# Patient Record
Sex: Male | Born: 1950 | Race: Black or African American | Hispanic: No | Marital: Married | Smoking: Never smoker
Health system: Southern US, Community
[De-identification: ages and names within clinical notes are randomized; demographics above are authoritative.]

## PROBLEM LIST (undated history)

## (undated) DIAGNOSIS — F32A Depression, unspecified: Secondary | ICD-10-CM

## (undated) DIAGNOSIS — E785 Hyperlipidemia, unspecified: Secondary | ICD-10-CM

## (undated) DIAGNOSIS — I10 Essential (primary) hypertension: Secondary | ICD-10-CM

## (undated) DIAGNOSIS — F329 Major depressive disorder, single episode, unspecified: Secondary | ICD-10-CM

## (undated) DIAGNOSIS — E1121 Type 2 diabetes mellitus with diabetic nephropathy: Secondary | ICD-10-CM

## (undated) DIAGNOSIS — M25569 Pain in unspecified knee: Secondary | ICD-10-CM

## (undated) DIAGNOSIS — N183 Chronic kidney disease, stage 3 (moderate): Secondary | ICD-10-CM

## (undated) DIAGNOSIS — H269 Unspecified cataract: Secondary | ICD-10-CM

## (undated) DIAGNOSIS — G40209 Localization-related (focal) (partial) symptomatic epilepsy and epileptic syndromes with complex partial seizures, not intractable, without status epilepticus: Secondary | ICD-10-CM

## (undated) DIAGNOSIS — E559 Vitamin D deficiency, unspecified: Secondary | ICD-10-CM

## (undated) DIAGNOSIS — H409 Unspecified glaucoma: Secondary | ICD-10-CM

## (undated) DIAGNOSIS — Z8619 Personal history of other infectious and parasitic diseases: Secondary | ICD-10-CM

## (undated) DIAGNOSIS — R972 Elevated prostate specific antigen [PSA]: Secondary | ICD-10-CM

## (undated) DIAGNOSIS — E1122 Type 2 diabetes mellitus with diabetic chronic kidney disease: Secondary | ICD-10-CM

## (undated) DIAGNOSIS — I471 Supraventricular tachycardia: Secondary | ICD-10-CM

## (undated) HISTORY — DX: Supraventricular tachycardia: I47.1

## (undated) HISTORY — DX: Vitamin D deficiency, unspecified: E55.9

## (undated) HISTORY — DX: Type 2 diabetes mellitus with diabetic chronic kidney disease: E11.22

## (undated) HISTORY — DX: Unspecified cataract: H26.9

## (undated) HISTORY — DX: Personal history of other infectious and parasitic diseases: Z86.19

## (undated) HISTORY — DX: Pain in unspecified knee: M25.569

## (undated) HISTORY — DX: Chronic kidney disease, stage 3 (moderate): N18.3

## (undated) HISTORY — DX: Major depressive disorder, single episode, unspecified: F32.9

## (undated) HISTORY — DX: Unspecified glaucoma: H40.9

## (undated) HISTORY — DX: Essential (primary) hypertension: I10

## (undated) HISTORY — DX: Hyperlipidemia, unspecified: E78.5

## (undated) HISTORY — DX: Type 2 diabetes mellitus with diabetic nephropathy: E11.21

## (undated) HISTORY — DX: Depression, unspecified: F32.A

## (undated) HISTORY — DX: Elevated prostate specific antigen (PSA): R97.20

## (undated) HISTORY — DX: Localization-related (focal) (partial) symptomatic epilepsy and epileptic syndromes with complex partial seizures, not intractable, without status epilepticus: G40.209

---
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
## 1988-08-22 DIAGNOSIS — G40209 Localization-related (focal) (partial) symptomatic epilepsy and epileptic syndromes with complex partial seizures, not intractable, without status epilepticus: Secondary | ICD-10-CM

## 1988-08-22 HISTORY — DX: Localization-related (focal) (partial) symptomatic epilepsy and epileptic syndromes with complex partial seizures, not intractable, without status epilepticus: G40.209

## 1988-08-22 HISTORY — PX: OTHER SURGICAL HISTORY: SHX169

## 1994-08-22 DIAGNOSIS — I471 Supraventricular tachycardia, unspecified: Secondary | ICD-10-CM

## 1994-08-22 DIAGNOSIS — E1121 Type 2 diabetes mellitus with diabetic nephropathy: Secondary | ICD-10-CM

## 1994-08-22 HISTORY — DX: Supraventricular tachycardia: I47.1

## 1994-08-22 HISTORY — DX: Type 2 diabetes mellitus with diabetic nephropathy: E11.21

## 1994-08-22 HISTORY — DX: Supraventricular tachycardia, unspecified: I47.10

## 2005-09-16 HISTORY — PX: COLONOSCOPY: SHX174

## 2009-02-19 HISTORY — PX: CARDIAC CATHETERIZATION: SHX172

## 2009-05-01 ENCOUNTER — Encounter: Payer: Self-pay | Admitting: Family Medicine

## 2009-10-22 ENCOUNTER — Encounter: Payer: Self-pay | Admitting: Family Medicine

## 2009-10-22 LAB — CONVERTED CEMR LAB
Hemoglobin: 12.7 g/dL
Platelets: 214 10*3/uL
TSH: 1.29 microintl units/mL
WBC: 5.2 10*3/uL

## 2009-11-20 HISTORY — PX: REPLACEMENT TOTAL KNEE: SUR1224

## 2010-03-05 LAB — HM DIABETES EYE EXAM

## 2010-03-11 ENCOUNTER — Encounter: Payer: Self-pay | Admitting: Family Medicine

## 2010-03-11 LAB — CONVERTED CEMR LAB
ALT: 12 units/L
AST: 13 units/L
Albumin: 3.8 g/dL
Alkaline Phosphatase: 103 units/L
Chloride: 102 meq/L
HDL: 42 mg/dL
LDL Cholesterol: 51 mg/dL
Potassium: 3.7 meq/L
Sodium: 139 meq/L
Total Bilirubin: 0.6 mg/dL
Total Protein: 6.3 g/dL

## 2010-03-12 ENCOUNTER — Encounter: Payer: Self-pay | Admitting: Family Medicine

## 2010-03-12 LAB — CONVERTED CEMR LAB
AST: 13 units/L
BUN: 14 mg/dL
CO2: 23 meq/L
Calcium: 8.9 mg/dL
Creatinine, Ser: 1.45 mg/dL
Hgb A1c MFr Bld: 5.5 %
Potassium: 3.7 meq/L
Triglycerides: 59 mg/dL

## 2010-03-30 ENCOUNTER — Encounter: Payer: Self-pay | Admitting: Family Medicine

## 2010-03-30 DIAGNOSIS — E1121 Type 2 diabetes mellitus with diabetic nephropathy: Secondary | ICD-10-CM | POA: Insufficient documentation

## 2010-03-30 DIAGNOSIS — E785 Hyperlipidemia, unspecified: Secondary | ICD-10-CM

## 2010-03-30 DIAGNOSIS — G40109 Localization-related (focal) (partial) symptomatic epilepsy and epileptic syndromes with simple partial seizures, not intractable, without status epilepticus: Secondary | ICD-10-CM | POA: Insufficient documentation

## 2010-03-30 DIAGNOSIS — I10 Essential (primary) hypertension: Secondary | ICD-10-CM | POA: Insufficient documentation

## 2010-04-07 ENCOUNTER — Ambulatory Visit: Payer: Self-pay | Admitting: Internal Medicine

## 2010-04-07 DIAGNOSIS — Z96659 Presence of unspecified artificial knee joint: Secondary | ICD-10-CM

## 2010-04-13 ENCOUNTER — Encounter: Payer: Self-pay | Admitting: Family Medicine

## 2010-04-21 ENCOUNTER — Ambulatory Visit: Payer: Self-pay | Admitting: Internal Medicine

## 2010-07-21 ENCOUNTER — Ambulatory Visit: Payer: Self-pay | Admitting: Internal Medicine

## 2010-07-21 ENCOUNTER — Encounter: Payer: Self-pay | Admitting: Family Medicine

## 2010-07-21 LAB — CONVERTED CEMR LAB
CO2: 23 meq/L
Calcium: 9.4 mg/dL
Chloride: 104 meq/L
Creatinine, Ser: 1.47 mg/dL
Glucose, Bld: 151 mg/dL

## 2010-07-21 LAB — HM DIABETES FOOT EXAM

## 2010-07-22 ENCOUNTER — Encounter: Payer: Self-pay | Admitting: Family Medicine

## 2010-09-20 ENCOUNTER — Ambulatory Visit
Admission: RE | Admit: 2010-09-20 | Discharge: 2010-09-20 | Payer: Self-pay | Source: Home / Self Care | Attending: Family Medicine | Admitting: Family Medicine

## 2010-09-21 NOTE — Letter (Signed)
Summary: new pt input from [facility] / DR. MORAYATI  MED REC FROM [facility] / DR. MORAYATI   Imported By: Carin Primrose 04/13/2010 10:33:23  _____________________________________________________________________  External Attachment:    Type:   Image     Comment:   External Document  Appended Document: new pt input [place], [place]    Clinical Lists Changes  Observations: Added new observation of PAST MED HX: T2DM HTN HLD partial epilepsy (neurologist Dr. Silvano Rusk [place]) R knee pain - s/p replacement h/o Hep A SVT 1996 (04/13/2010 10:49) Added new observation of PAST SURG HX: Corrective surgery amblyopia (1957) L Knee surgery 1971 (torn ACL) Cystectomy or meningioma removal brain 1990 (unclear) cath 02/2009 - no blockages (Dr. Park Breed Cardiologist in [place]) L knee replacement 11/2009 ([facility] [place]) (04/13/2010 10:49) Added new observation of HGBA1C: 7.0 % (05/01/2009 10:49) Added new observation of LDL: 76 mg/dL (30/86/5784 69:62) Added new observation of HDL: 46 mg/dL (95/28/4132 44:01) Added new observation of TRIGLYC TOT: 90 mg/dL (02/72/5366 44:03) Added new observation of CHOLESTEROL: 140 mg/dL (47/42/5956 38:75) Added new observation of PSA: 1.6 ng/mL (05/01/2009 10:49) Added new observation of ALBUMIN: 4.4 g/dL (64/33/2951 88:41) Added new observation of PROTEIN, TOT: 6.8 g/dL (66/01/3015 01:09) Added new observation of SGPT (ALT): 16 units/L (05/01/2009 10:49) Added new observation of SGOT (AST): 14 units/L (05/01/2009 10:49) Added new observation of ALK PHOS: 97 units/L (05/01/2009 10:49) Added new observation of BILI TOTAL: 0.8 mg/dL (32/35/5732 20:25) Added new observation of PLATELETK/UL: 174 K/uL (05/01/2009 10:49) Added new observation of HCT: 37.6 % (05/01/2009 10:49) Added new observation of HGB: 12.4 g/dL (42/70/6237 62:83) Added new observation of WBC COUNT: 5.5 10*3/microliter (05/01/2009 10:49)        Past  History:  Past Medical History: T2DM HTN HLD partial epilepsy (neurologist Dr. Silvano Rusk [place]) R knee pain - s/p replacement h/o Hep A SVT 1996  Past Surgical History: Corrective surgery amblyopia (1957) L Knee surgery 1971 (torn ACL) Cystectomy or meningioma removal brain 1990 (unclear) cath 02/2009 - no blockages (Dr. Park Breed Cardiologist in [place]) L knee replacement 11/2009 ([facility] [place])

## 2010-09-21 NOTE — Miscellaneous (Signed)
  Clinical Lists Changes  Observations: Added new observation of HGBA1C: 5.9 % (07/21/2010 16:00) Added new observation of PSA: 2.0 ng/mL (07/21/2010 16:00) Added new observation of CALCIUM: 9.4 mg/dL (27/25/3664 40:34) Added new observation of CREATININE: 1.47 mg/dL (74/25/9563 87:56) Added new observation of BUN: 18 mg/dL (43/32/9518 84:16) Added new observation of BG RANDOM: 151 mg/dL (60/63/0160 10:93) Added new observation of CO2 PLSM/SER: 23 meq/L (07/21/2010 16:00) Added new observation of CL SERUM: 104 meq/L (07/21/2010 16:00) Added new observation of K SERUM: 4.2 meq/L (07/21/2010 16:00) Added new observation of NA: 140 meq/L (07/21/2010 16:00)

## 2010-09-21 NOTE — Assessment & Plan Note (Signed)
Summary: FOLLOW UP / LFW   Vital Signs:  Patient profile:   60 year old male Weight:      270 pounds Temp:     98.1 degrees F oral Pulse rate:   76 / minute Pulse rhythm:   regular BP sitting:   118 / 70  (left arm) Cuff size:   large  Vitals Entered By: Selena Batten Dance CMA (AAMA) (July 21, 2010 8:14 AM) CC: 3 month follow up   History of Present Illness: CC: 3 mo f/u  1.  DM - ran out of avandia, stopped.  on metformin and glimepiride twice a day.  not checking sugars.  no lows, feels fine, knows how he feels when running high or low.  2. HTN - no HA, vision changes, chest pain, tightness, urinary changes, LE swelling.  compliant with ACEI/HCTZ  3. HLD - lipitor.  no muscle aches  Current Medications (verified): 1)  Metformin Hcl 500 Mg Tabs (Metformin Hcl) .Marland Kitchen.. 1 By Mouth Two Times A Day 2)  Glimepiride 4 Mg Tabs (Glimepiride) .Marland Kitchen.. 1 By Mouth Two Times A Day 3)  Lipitor 40 Mg Tabs (Atorvastatin Calcium) .Marland Kitchen.. 1 By Mouth Once Daily 4)  Lisinopril-Hydrochlorothiazide 10-12.5 Mg Tabs (Lisinopril-Hydrochlorothiazide) .Marland Kitchen.. 1 By Mouth Once Daily 5)  Levetiracetam 250 Mg Tabs (Levetiracetam) .Marland Kitchen.. 1 By Mouth Two Times A Day  Allergies (verified): No Known Drug Allergies  Past History:  Past Medical History: Last updated: 04/13/2010 T2DM HTN HLD partial epilepsy (neurologist Dr. 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  Teeth in good repair. Neck:  No deformities, masses, or tenderness noted.  no LAD, no Thyromegaly,  no bruits Lungs:  Normal respiratory effort, chest expands symmetrically. Lungs are clear to auscultation, no crackles or wheezes. Heart:  SEM heard best at LUSB Abdomen:  Bowel sounds positive,abdomen soft and non-tender without masses, organomegaly or hernias noted. Pulses:  2+ rad/DP/PT pulses Extremities:  no edema Skin:  Intact without suspicious lesions or rashes  Diabetes Management Exam:    Foot Exam (with socks and/or shoes not present):       Sensory-Pinprick/Light touch:          Left medial foot (L-4): normal          Left dorsal foot (L-5): normal          Left lateral foot (S-1): normal          Right medial foot (L-4): normal          Right dorsal foot (L-5): normal          Right lateral foot (S-1): normal       Sensory-Monofilament:          Left foot: normal          Right foot: normal       Inspection:          Left foot: normal          Right foot: normal       Nails:  Left foot: thickened          Right foot: thickened   Impression & Recommendations:  Problem # 1:  DIABETES MELLITUS, TYPE II (ICD-250.00) stable on current meds.  off avandia.  check A1c today, if elevated consider increasing metformin vs starting januvia/actos.  The following medications were removed from the medication list:    Avandia 4 Mg Tabs (Rosiglitazone maleate) .Marland Kitchen... 1 by mouth once daily His updated medication list for this problem includes:    Metformin Hcl 500 Mg Tabs (Metformin hcl) .Marland Kitchen... 1 by mouth two times a day    Glimepiride 4 Mg Tabs (Glimepiride) .Marland Kitchen... 1 by mouth two times a day    Lisinopril-hydrochlorothiazide 10-12.5 Mg Tabs (Lisinopril-hydrochlorothiazide) .Marland Kitchen... 1 by mouth once daily  Orders: Venipuncture (54627) Specimen Handling (03500) T-Basic Metabolic Panel [phone]) T- Hemoglobin A1C [phone])  Labs Reviewed: Creat: 1.45 (03/12/2010)     Last Eye Exam: done (03/05/2010) Reviewed HgBA1c results: 5.5 (03/12/2010)  5.5  (03/11/2010)  Problem # 2:  SPECIAL SCREENING MALIGNANT NEOPLASM OF PROSTATE (ICD-V76.44)  DRE reassuring last visit.  psa today  Orders: Venipuncture (01751) Specimen Handling (02585) T-PSA (27782-42353)  Problem # 3:  HYPERTENSION (ICD-401.9) stable on current med.  His updated medication list for this problem includes:    Lisinopril-hydrochlorothiazide 10-12.5 Mg Tabs (Lisinopril-hydrochlorothiazide) .Marland Kitchen... 1 by mouth once daily  BP today: 118/70 Prior BP: 124/70 (04/21/2010)  Labs Reviewed: K+: 3.7 (03/12/2010) Creat: : 1.45 (03/12/2010)   Chol: 105 (03/12/2010)   HDL: 42 (03/12/2010)   LDL: 51 (03/12/2010)   TG: 59 (03/12/2010)  Problem # 4:  HYPERLIPIDEMIA (ICD-272.4) stable on lipitor.  to switch to generic next refill. His updated medication list for this problem includes:    Lipitor 40 Mg Tabs (Atorvastatin calcium) .Marland Kitchen... 1 by mouth once daily  Labs Reviewed: SGOT: 13 (03/12/2010)   SGPT: 12 (03/12/2010)   HDL:42 (03/12/2010), 42 (03/11/2010)  LDL:51 (03/12/2010), 51 (03/11/2010)  Chol:105 (03/12/2010), 105 (03/11/2010)  Trig:59 (03/12/2010), 59 (03/11/2010)  Complete Medication List: 1)  Metformin Hcl 500 Mg Tabs (Metformin hcl) .Marland Kitchen.. 1 by mouth two times a day 2)  Glimepiride 4 Mg Tabs (Glimepiride) .Marland Kitchen.. 1 by mouth two times a day 3)  Lipitor 40 Mg Tabs (Atorvastatin calcium) .Marland Kitchen.. 1 by mouth once daily 4)  Lisinopril-hydrochlorothiazide 10-12.5 Mg Tabs (Lisinopril-hydrochlorothiazide) .Marland Kitchen.. 1 by mouth once daily 5)  Levetiracetam 250 Mg Tabs (Levetiracetam) .Marland Kitchen.. 1 by mouth two times a day  Patient Instructions: 1)  Return in3-6 mo for follow up. 2)  Good to see you today. 3)  Blood work today. 4)  Return June for CPE   Orders Added: 1)  Venipuncture [61443] 2)  Specimen Handling [99000] 3)  T-Basic Metabolic Panel [[id number]] 4)  T- Hemoglobin A1C [83036-23375] 5)  T-PSA [15400-86761] 6)  Est. Patient Level IV [95093]    Current Allergies (reviewed  today): No known allergies

## 2010-09-21 NOTE — Assessment & Plan Note (Signed)
Summary: CPX / LFW   Vital Signs:  Patient profile:   60 year old male Height:      75 inches Weight:      266.75 pounds Temp:     98.4 degrees F oral Pulse rate:   84 / minute Pulse rhythm:   regular BP sitting:   124 / 70  (left arm) Cuff size:   large CC: CPx   History of Present Illness: CC: CPE  Here for CPE.  no concerns.  flu shot today.  UTD on tetanus.  Brings colonoscopy report from 2007, hyperplastic polyps x 2, due for rop 5-7 years.  No BM changes, no blood in stool  PSA 1.7 04/2009, due for rpt check today, PSA next visit.  Nocturia x 1 at night.  DM - still has <1 mo supply of avandia.  not really cehcking sugars, feels knows body well enough to know when out of whack.  no paresthesias, no CP, tightness, SOB.  Preventive Screening-Counseling & Management  Alcohol-Tobacco     Alcohol drinks/day: 0     Smoking Status: never  Caffeine-Diet-Exercise     Caffeine use/day: <1 cup tea  Colonoscopy  Procedure date:  09/16/2005  Findings:       Results: Normal. Results: benign hyperplastic Polyps in cecum and rectum measuring 2mm.  Results: nonbleeding internal Hemorrhoids.     Pathology:  Hyperplastic polyp.       Comments:      Repeat colonoscopy in 5 years.   Procedures Next Due Date:    Colonoscopy: 09/2010  Current Medications (verified): 1)  Avandia 4 Mg Tabs (Rosiglitazone Maleate) .Marland Kitchen.. 1 By Mouth Once Daily 2)  Glimepiride 4 Mg Tabs (Glimepiride) .Marland Kitchen.. 1 By Mouth Two Times A Day 3)  Levetiracetam 250 Mg Tabs (Levetiracetam) .Marland Kitchen.. 1 By Mouth Two Times A Day 4)  Lipitor 40 Mg Tabs (Atorvastatin Calcium) .Marland Kitchen.. 1 By Mouth Once Daily 5)  Lisinopril-Hydrochlorothiazide 10-12.5 Mg Tabs (Lisinopril-Hydrochlorothiazide) .Marland Kitchen.. 1 By Mouth Once Daily 6)  Metformin Hcl 500 Mg Tabs (Metformin Hcl) .Marland Kitchen.. 1 By Mouth Two Times A Day  Allergies (verified): No Known Drug Allergies  Past History:  Past medical, surgical, family and social histories (including  risk factors) reviewed for relevance to current acute and chronic problems.  Past Medical History: Reviewed history from 04/13/2010 and no changes required. T2DM HTN HLD partial epilepsy (neurologist Dr. Silvano Rusk [place]) R knee pain - s/p replacement h/o Hep A SVT 1996  Past Surgical History: Reviewed history from 04/13/2010 and no changes required. Corrective surgery amblyopia (1957) L Knee surgery 1971 (torn ACL) Cystectomy or meningioma removal brain 1990 (unclear) cath 02/2009 - no blockages (Dr. Park Breed Cardiologist in [place]) L knee replacement 11/2009 ([facility])  Family History: Reviewed history from 04/07/2010 and no changes required. Father - 51 CHF deceased Mother - lung cancer, aortic rupture 48 deceased Sister - 66yo mental disorder Aunts and cousins - DM 1st cousing - Colon CA age 64  No other CA, CVA, CAD/MI  Social History: Reviewed history from 04/07/2010 and no changes required. 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         nocturia x 1.  Physical Exam  General:  Well-developed,well-nourished,in no acute distress; alert,appropriate and cooperative throughout examination Head:  Normocephalic and atraumatic without obvious abnormalities. No apparent alopecia or balding. Eyes:  No corneal or  conjunctival inflammation noted. EOMI. Perrla.  Mouth:  Oral mucosa and oropharynx without lesions or exudates.  Teeth in good repair. Neck:  No deformities, masses, or tenderness noted.  no LAD, no Thyromegaly Lungs:  Normal respiratory effort, chest expands symmetrically. Lungs are clear to auscultation, no crackles or wheezes. Heart:  SEM heard best at LUSB Abdomen:  Bowel sounds positive,abdomen soft and non-tender without masses, organomegaly or hernias noted. Rectal:  No external abnormalities noted. Normal sphincter tone. No rectal masses or tenderness. Prostate:  Prostate gland firm and smooth, no enlargement, nodularity, tenderness, mass, asymmetry or induration. 20-30 gm Msk:  L knee with midline healing scar from total knee replacement Pulses:  2+ rad pulses Extremities:  no edema Skin:  Intact without suspicious lesions or rashes   Impression & Recommendations:  Problem # 1:  HEALTH MAINTENANCE EXAM (ICD-V70.0) Reviewed preventive care protocols, scheduled due services, and updated immunizations.  flu shot today.  UTD colon and prostate.  Problem # 2:  DIABETES MELLITUS, TYPE II (ICD-250.00) last month a1c 5.5.  to finish avandia and stop med.  RTC 3 mo for f/u.  compliant with meds. His updated medication list for this problem includes:    Metformin Hcl 500 Mg Tabs (Metformin hcl) .Marland Kitchen... 1 by mouth two times a day    Avandia 4 Mg Tabs (Rosiglitazone maleate) .Marland Kitchen... 1 by mouth once daily    Glimepiride 4 Mg Tabs (Glimepiride) .Marland Kitchen... 1 by mouth two times a day    Lisinopril-hydrochlorothiazide 10-12.5 Mg Tabs (Lisinopril-hydrochlorothiazide) .Marland Kitchen... 1 by mouth once daily  Labs Reviewed: Creat: 1.45 (03/12/2010)     Last Eye Exam: done (03/05/2010) Reviewed HgBA1c results: 5.5 (03/12/2010)  5.5 (03/11/2010)  Problem # 3:  SPECIAL SCREENING MALIGNANT NEOPLASM OF PROSTATE (ICD-V76.44) DRE reassuring.  psa next blood work. Orders: Hemoccult Guaiac-1 spec.(in office)  ([id number])  Problem # 4:  SPECIAL SCREENING FOR MALIGNANT NEOPLASMS COLON (ICD-V76.51) guaiac neg.  due for colonoscopy 2013-2014  Complete Medication List: 1)  Metformin Hcl 500 Mg Tabs (Metformin hcl) .Marland Kitchen.. 1 by mouth two times a day 2)  Avandia 4 Mg Tabs (Rosiglitazone maleate) .Marland Kitchen.. 1 by mouth once daily 3)  Glimepiride 4 Mg Tabs (Glimepiride) .Marland Kitchen.. 1 by mouth two times a day 4)  Lipitor 40 Mg Tabs (Atorvastatin calcium) .Marland Kitchen.. 1 by mouth once daily 5)  Lisinopril-hydrochlorothiazide 10-12.5 Mg Tabs (Lisinopril-hydrochlorothiazide) .Marland Kitchen.. 1 by mouth once daily 6)  Levetiracetam 250 Mg Tabs (Levetiracetam) .Marland Kitchen.. 1 by mouth two times a day  Other Orders: Flu Vaccine 22yrs + (87564) Admin 1st Vaccine (33295)  Patient Instructions: 1)  Come back in 3 months. 2)  Plesaure to see you today. 3)  Flu shot today. 4)  You'll be due for colonoscopy around 2012-2014. 5)  Prostate check today.  We will check PSA (prostate level) next visit with routine blood work for diabetes.  Current Allergies (reviewed today): No known allergies    Prevention & Chronic Care Immunizations   Influenza vaccine: Fluvax 3+  (04/21/2010)   Influenza vaccine due: 04/23/2011    Tetanus booster: 03/05/2001: given   Tetanus booster due: 03/06/2011  Pneumococcal vaccine: Not documented  Colorectal Screening   Hemoccult: Not documented    Colonoscopy:  Results: Normal. Results: benign hyperplastic Polyps in cecum and rectum measuring 2mm.  Results: nonbleeding internal Hemorrhoids.     Pathology:  Hyperplastic polyp.       (09/16/2005)   Colonoscopy action/deferral: Repeat colonoscopy in 5 years.   (09/16/2005)   Colonoscopy due: 09/2010  Other Screening   PSA: 1.6  (05/01/2009)   PSA action/deferral: Discussed-PSA requested  (04/21/2010)   PSA due due: 05/01/2010   Smoking status: never  (04/21/2010)  Diabetes Mellitus   HgbA1C: 5.5  (03/12/2010)    Eye exam: done  (03/05/2010)    Foot exam: yes   (04/07/2010)   High risk foot: Not documented   Foot care education: Not documented    Urine microalbumin/creatinine ratio: Not documented    Diabetes flowsheet reviewed?: Yes   Progress toward A1C goal: At goal  Lipids   Total Cholesterol: 105  (03/12/2010)   LDL: 51  (03/12/2010)   LDL Direct: Not documented   HDL: 42  (03/12/2010)   Triglycerides: 59  (03/12/2010)    SGOT (AST): 13  (03/12/2010)   SGPT (ALT): 12  (03/12/2010)   Alkaline phosphatase: 103  (03/12/2010)   Total bilirubin: 0.6  (03/12/2010)    Lipid flowsheet reviewed?: Yes   Progress toward LDL goal: At goal  Hypertension   Last Blood Pressure: 124 / 70  (04/21/2010)   Serum creatinine: 1.45  (03/12/2010)   Serum potassium 3.7  (03/12/2010)    Hypertension flowsheet reviewed?: Yes   Progress toward BP goal: At goal  Self-Management Support :   Personal Goals (by the next clinic visit) :     Personal A1C goal: 7  (04/07/2010)     Personal blood pressure goal: 130/80  (04/07/2010)     Personal LDL goal: 100  (04/07/2010)    Diabetes self-management support: Not documented    Hypertension self-management support: Not documented    Lipid self-management support: Not documented    Nursing Instructions: Give Flu vaccine today    Immunizations Administered:  Influenza Vaccine # 1:    Vaccine Type: Fluvax 3+    Site: left deltoid    Mfr: [email or web address]    Dose: 0.5 ml    Route: IM    Given by: Selena Batten Dance CMA (AAMA)    Exp. Date: 02/19/2011    Lot #: [id number]    VIS given: 03/16/2010  Flu Vaccine Consent Questions:    Do you have a history of severe allergic reactions to this vaccine? no    Any prior history of allergic reactions to egg and/or gelatin? no    Do you have a sensitivity to the preservative Thimersol? no    Do you have a past history of Guillan-Barre Syndrome? no    Do you currently have an acute febrile illness? no    Have you ever had a severe reaction to latex? no     Vaccine information given and explained to patient? yes

## 2010-09-21 NOTE — Miscellaneous (Signed)
Summary: Flowsheet update  Clinical Lists Changes  Observations: Added new observation of HGBA1C: 5.5 % (03/11/2010 11:28) Added new observation of CALCIUM: 8.9 mg/dL (46/96/2952 84:13) Added new observation of ALBUMIN: 3.8 g/dL (24/40/1027 25:36) Added new observation of PROTEIN, TOT: 6.3 g/dL (64/40/3474 25:95) Added new observation of SGPT (ALT): 12 units/L (03/11/2010 11:28) Added new observation of SGOT (AST): 13 units/L (03/11/2010 11:28) Added new observation of ALK PHOS: 103 units/L (03/11/2010 11:28) Added new observation of BILI TOTAL: 0.6 mg/dL (63/87/5643 32:95) Added new observation of GFR: 53 mL/min (03/11/2010 11:28) Added new observation of CREATININE: 1.45 mg/dL (18/84/1660 63:01) Added new observation of BUN: 14 mg/dL (60/05/9322 55:73) Added new observation of BG RANDOM: 91 mg/dL (22/09/5425 06:23) Added new observation of CO2 PLSM/SER: 23 meq/L (03/11/2010 11:28) Added new observation of CL SERUM: 102 meq/L (03/11/2010 11:28) Added new observation of K SERUM: 3.7 meq/L (03/11/2010 11:28) Added new observation of NA: 139 meq/L (03/11/2010 11:28) Added new observation of LDL: 51 mg/dL (76/28/3151 76:16) Added new observation of HDL: 42 mg/dL (07/37/1062 69:48) Added new observation of TRIGLYC TOT: 59 mg/dL (54/62/7035 00:93) Added new observation of CHOLESTEROL: 105 mg/dL (81/82/9937 16:96) Added new observation of HGBA1C: 5.6 % (10/22/2009 11:28)

## 2010-09-21 NOTE — Assessment & Plan Note (Signed)
Summary: NEW PT TO ESTABH/DLO   Vital Signs:  Patient profile:   60 year old male Height:      75 inches Weight:      264 pounds BMI:     33.12 Temp:     98.3 degrees F oral Pulse rate:   84 / minute Pulse rhythm:   regular BP sitting:   118 / 60  (left arm) Cuff size:   large  Vitals Entered By: Selena Batten Dance CMA Duncan Dull) (April 07, 2010 9:28 AM) CC: New patient to establish care   History of Present Illness: CC: new patient  blood work last month.  would like results.  reviewed with patient  1. HTN - lisinopril HCTZ, compliant with meds.  no HA, vision changes, chest pain, tightness, urinary changes, SOB, LE swelling.  No dizziness.  2. HLD - lipitor 40mg .  no muscle pains.  3. T2DM - on 3 meds for this.  Dx 1998.  No lows <60.  Sugars well controlled.  70-90 fasting.  Last vision check <77mo ago.  Last foot check 1 year ago.    4. L knee - replaced 11/20/2009.  Had bad arthritis with pain.  Dr. Georga Bora [facility].  5. 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  Started a few years ago.  precipited by brain surgery 1990 (achroid cyst drained.)  Preventive Screening-Counseling & Management  Alcohol-Tobacco     Alcohol drinks/day: 0     Smoking Status: never  Caffeine-Diet-Exercise     Caffeine use/day: <1 cup tea      Drug Use:  never.    -  Date:  03/05/2010    Diabetes Eye Exam done  Date:  03/05/2001    TD booster given  Current Medications (verified): 1)  Avandia 4 Mg Tabs (Rosiglitazone Maleate) .Marland Kitchen.. 1 By Mouth Once Daily 2)  Glimepiride 4 Mg Tabs (Glimepiride) .Marland Kitchen.. 1 By Mouth Two Times A Day 3)  Levetiracetam 250 Mg Tabs (Levetiracetam) .Marland Kitchen.. 1 By Mouth Two Times A Day 4)  Lipitor 40 Mg Tabs (Atorvastatin Calcium) .Marland Kitchen.. 1 By Mouth Once Daily 5)  Lisinopril-Hydrochlorothiazide 10-12.5 Mg Tabs (Lisinopril-Hydrochlorothiazide) .Marland Kitchen.. 1 By Mouth Once Daily 6)  Metformin Hcl 500 Mg Tabs (Metformin Hcl) .Marland Kitchen.. 1 By Mouth Two Times A Day  Allergies (verified): No Known  Drug Allergies  Past History:  Past Medical History: T2DM HTN HLD partial epilepsy (neurologist Dr. Silvano Rusk) R knee pain - s/p replacement  Past Surgical History: L Knee surgery 1970 (torn ACL) Cystectomy brain 1990 coronary angio 02/2009 - no blockages (Dr. 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    Physical Exam  General:  Well-developed,well-nourished,in no acute distress; alert,appropriate and cooperative  throughout examination Head:  Normocephalic and atraumatic without obvious abnormalities. No apparent alopecia or balding. Eyes:  No corneal or conjunctival inflammation noted. EOMI. Perrla.  Mouth:  Oral mucosa and oropharynx without lesions or exudates.  Teeth in good repair. Neck:  No deformities, masses, or tenderness noted.  no LAD, no Thyromegaly Lungs:  Normal respiratory effort, chest expands symmetrically. Lungs are clear to auscultation, no crackles or wheezes. Heart:  Normal rate and regular rhythm. S1 and S2 normal without gallop, murmur, click, rub or other extra  sounds. Msk:  L knee with midline healing scar from total knee replacement Pulses:  2+ radial, DP, PT bilaterally Extremities:  no c/c/e Skin:  Intact without suspicious lesions or rashes Psych:  Cognition and judgment appear intact. Alert and cooperative with normal attention span and concentration. No apparent delusions, illusions, hallucinations.  disgruntled with last provider.  Diabetes Management Exam:    Foot Exam (with socks and/or shoes not present):       Sensory-Pinprick/Light touch:          Left medial foot (L-4): normal          Left dorsal foot (L-5): normal          Left lateral foot (S-1): normal          Right medial foot (L-4): normal          Right dorsal foot (L-5): normal          Right lateral foot (S-1): normal       Sensory-Monofilament:          Left foot: diminished          Right foot: diminished       Inspection:          Left foot: normal          Right foot: normal       Nails:          Left foot: thickened          Right foot: thickened   Impression & Recommendations:  Problem # 1:  DIABETES MELLITUS, TYPE II (ICD-250.00) Last A1c 5.5%.  Recommended d/c avandia when runs out of prescription (about 1 more month).  Has been on x 6 years, prefers to complete regimen.  then will monitor sugars and A1c, if >7 will consider starting Actos.  His updated medication list for this problem includes:    Avandia 4 Mg Tabs (Rosiglitazone maleate) .Marland Kitchen... 1 by mouth once daily    Glimepiride 4 Mg Tabs (Glimepiride) .Marland Kitchen... 1 by mouth two times a day    Lisinopril-hydrochlorothiazide 10-12.5 Mg Tabs (Lisinopril-hydrochlorothiazide) .Marland Kitchen... 1 by mouth once daily    Metformin Hcl 500 Mg Tabs (Metformin hcl) .Marland Kitchen... 1 by mouth two times a day  Labs Reviewed: Creat: 1.45 (03/12/2010)    Reviewed HgBA1c results: 5.5 (03/12/2010)  Problem # 2:  HYPERTENSION (ICD-401.9) good control on current regimen.  No prior creatinine.  Check next visit (3 wks for CPE). His updated  medication list for this problem includes:    Lisinopril-hydrochlorothiazide 10-12.5 Mg Tabs (Lisinopril-hydrochlorothiazide) .Marland Kitchen... 1 by mouth once daily  BP today: 118/60  Labs Reviewed: K+: 3.7 (03/12/2010) Creat: : 1.45 (03/12/2010)   Chol: 105 (03/12/2010)   HDL: 42 (03/12/2010)   LDL: 51 (03/12/2010)   TG: 59 (03/12/2010)  Problem # 3:  HYPERLIPIDEMIA (ICD-272.4) good control on lipitor. LDL 51.  no myalgias. His updated medication list for this problem includes:    Lipitor 40  Mg Tabs (Atorvastatin calcium) .Marland Kitchen... 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 His updated medication list for this problem includes:    Levetiracetam 250 Mg Tabs (Levetiracetam) .Marland Kitchen... 1 by mouth two times a day  Labs Reviewed: Hgb: 12.7 (10/22/2009)   WBC: 5.2 (10/22/2009)   Plts: 214 (10/22/2009) SGOT: 13 (03/12/2010)   SGPT: 12 (03/12/2010)     Problem # 5:  TOTAL KNEE REPLACEMENT, LEFT, HX OF (ICD-V43.65) monitor for now.  taking 800mg  motrin prior to PT 2 x/wk.  Given Cr 1.45, rec decrease to 600mg  motrin.  if ok pain, decrease to 400mg  motrin.  could consider tramadol in future.  Problem # 6:  Preventive Health Care (ICD-V70.0) schedule CPE next visit.  obtain records of latest PSA and colonoscopy.  Complete Medication List: 1)  Avandia 4 Mg Tabs (Rosiglitazone maleate) .Marland Kitchen.. 1 by mouth once daily 2)  Glimepiride 4 Mg Tabs (Glimepiride) .Marland Kitchen.. 1 by mouth two times a day 3)  Levetiracetam 250 Mg Tabs (Levetiracetam) .Marland Kitchen.. 1 by mouth two times a day 4)  Lipitor 40 Mg Tabs (Atorvastatin calcium) .Marland Kitchen.. 1 by mouth once daily 5)  Lisinopril-hydrochlorothiazide 10-12.5 Mg Tabs (Lisinopril-hydrochlorothiazide) .Marland Kitchen.. 1 by mouth once daily 6)  Metformin Hcl 500 Mg Tabs (Metformin hcl) .Marland Kitchen.. 1 by mouth two times a  day  Patient Instructions: 1)  Return in 2-3 weeks for complete physical. 2)  We will obtain records from Dr. Ree Edman in [facility]) for prostate and colon cancer screening. 3)  Based on your A1c, your diabetes is well controlled on current regimen. 4)  Try to cut back to 3 pills of motrin.  If that controls pain during PT, go down to 2 pills. 5)  When you finish avandia, stop taking. 6)  Pleasure to meet you today.  Current Allergies (reviewed today): No known allergies    Prevention & Chronic Care Immunizations   Influenza vaccine: Not documented    Tetanus booster: 03/05/2001: given   Tetanus booster due: 03/06/2011    Pneumococcal vaccine: Not documented  Colorectal Screening   Hemoccult: Not documented    Colonoscopy: Not documented  Other Screening   PSA: Not documented   Smoking status: never  (04/07/2010)  Diabetes Mellitus   HgbA1C: 5.5  (03/12/2010)    Eye exam: done  (03/05/2010)    Foot exam: yes  (04/07/2010)   High risk foot: Not documented   Foot care education: Not documented    Urine microalbumin/creatinine ratio: Not documented    Diabetes flowsheet reviewed?: Yes   Progress toward A1C goal: At goal  Lipids   Total Cholesterol: 105  (03/12/2010)   LDL: 51  (03/12/2010)   LDL Direct: Not documented   HDL: 42  (03/12/2010)   Triglycerides: 59  (03/12/2010)    SGOT (AST): 13  (03/12/2010)   SGPT (ALT): 12  (03/12/2010)   Alkaline phosphatase: 103  (03/12/2010)   Total bilirubin: 0.6  (03/12/2010)    Lipid flowsheet reviewed?: Yes   Progress toward LDL goal: At goal  Hypertension   Last Blood Pressure: 118 / 60  (04/07/2010)   Serum creatinine: 1.45  (03/12/2010)   Serum potassium 3.7  (03/12/2010)    Hypertension flowsheet reviewed?: Yes   Progress toward BP goal: At goal  Self-Management Support :  Personal Goals (by the next clinic visit) :     Personal A1C goal: 7  (04/07/2010)     Personal blood  pressure goal: 130/80  (04/07/2010)     Personal LDL goal: 100  (04/07/2010)    Diabetes self-management support: Not documented    Hypertension self-management support: Not documented    Lipid self-management support: Not documented

## 2010-09-21 NOTE — Letter (Signed)
Summary: LABS FROM DR. MORAYATI - FROM 10/22/08 - 04/13/10  LABS FROM DR. MORAYATI - FROM 10/22/08 - 04/13/10   Imported By: Carin Primrose 04/13/2010 10:34:38  _____________________________________________________________________  External Attachment:    Type:   Image     Comment:   External Document

## 2010-09-21 NOTE — Miscellaneous (Signed)
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.50) Medications: Added new medication of AVANDIA 4 MG TABS (ROSIGLITAZONE MALEATE) 1 by mouth once daily Added new medication of GLIMEPIRIDE 4 MG TABS (GLIMEPIRIDE) 1 by mouth two times a day Added new medication of LEVETIRACETAM 250 MG TABS (LEVETIRACETAM) 1 by mouth two times a day Added new medication of LIPITOR 40 MG TABS (ATORVASTATIN CALCIUM) 1 by mouth once daily Added new medication of LISINOPRIL-HYDROCHLOROTHIAZIDE 10-12.5 MG TABS (LISINOPRIL-HYDROCHLOROTHIAZIDE) 1 by mouth once daily Added new medication of METFORMIN HCL 500 MG TABS (METFORMIN HCL) 1 by mouth two times a day Observations: Added new observation of NKA: T (03/30/2010 22:27) Added new observation of FAMILY HX: Father - 6 CHF deceased  Mother - lung cancer, aortic rupture 21 deceased Sister - 66yo mental disorder  (03/30/2010 22:27) Added new observation of SOCIAL HX: + caffeine use,  No EtOH, smoking, rec drugs Married Retired from work 3 children (2 sons, 1 daughter) (03/30/2010 22:27) Added new observation of PAST SURG HX: L Knee surgery 1970 Cystectomy brain 1990 coronary angio 02/2009 - no blockages L knee replacement 11/2009 ([facility]) (03/30/2010 22:27) Added new observation of PAST MED HX: T2DM HTN HLD partial epilepsy (neurologist Dr. Silvano Rusk) (03/30/2010 22:27) Added new observation of HGBA1C: 5.5 % (03/12/2010 22:27) Added new observation of CALCIUM: 8.9 mg/dL (04/54/0981 19:14) Added new observation of ALBUMIN: 3.8 g/dL (78/29/5621 30:86) Added new observation of PROTEIN, TOT: 6.3 g/dL (57/84/6962 95:28) Added new observation of SGPT (ALT): 12 units/L (03/12/2010 22:27) Added new observation of SGOT (AST): 13 units/L  (03/12/2010 22:27) Added new observation of ALK PHOS: 103 units/L (03/12/2010 22:27) Added new observation of BILI TOTAL: 0.6 mg/dL (41/32/4401 02:72) Added new observation of CREATININE: 1.45 mg/dL (53/66/4403 47:42) Added new observation of BUN: 14 mg/dL (59/56/3875 64:33) Added new observation of CO2 PLSM/SER: 23 meq/L (03/12/2010 22:27) Added new observation of CL SERUM: 102 meq/L (03/12/2010 22:27) Added new observation of K SERUM: 3.7 meq/L (03/12/2010 22:27) Added new observation of NA: 139 meq/L (03/12/2010 22:27) Added new observation of LDL: 51 mg/dL (29/51/8841 66:06) Added new observation of HDL: 42 mg/dL (30/16/0109 32:35) Added new observation of TRIGLYC TOT: 59 mg/dL (57/32/2025 42:70) Added new observation of CHOLESTEROL: 105 mg/dL (62/37/6283 15:17) Added new observation of PLATELETK/UL: 214 K/uL (10/22/2009 22:27) Added new observation of MCHC RBC: 31.6 g/dL (61/60/7371 06:26) Added new observation of MCV: 88 fL (10/22/2009 22:27) Added new observation of HGB: 12.7 g/dL (94/85/4627 03:50) Added new observation of WBC COUNT: 5.2 10*3/microliter (10/22/2009 22:27) Added new observation of TSH: 1.290 microintl units/mL (10/22/2009 22:27)       Past History:  Past Medical History: T2DM HTN HLD partial epilepsy (neurologist Dr. Silvano Rusk)  Past Surgical History: L Knee surgery 1970 Cystectomy brain 1990 coronary angio 02/2009 - no blockages L knee replacement 11/2009 ([facility])   Family History: Father - 66 CHF deceased  Mother - lung cancer, aortic rupture 55 deceased Sister - 62yo mental disorder  Social History: + caffeine use,  No EtOH, smoking, rec drugs Married Retired from work 3 children (2 sons, 1 daughter)  Prior Medications: AVANDIA 4 MG TABS (ROSIGLITAZONE MALEATE) 1 by mouth once daily GLIMEPIRIDE 4 MG TABS (GLIMEPIRIDE) 1 by mouth two times a day LEVETIRACETAM 250 MG TABS (LEVETIRACETAM) 1 by mouth two times a day  LIPITOR 40 MG TABS  (ATORVASTATIN CALCIUM) 1 by mouth once daily LISINOPRIL-HYDROCHLOROTHIAZIDE 10-12.5 MG TABS (LISINOPRIL-HYDROCHLOROTHIAZIDE) 1 by mouth once daily METFORMIN HCL 500 MG TABS (METFORMIN HCL) 1 by mouth two times a day Current Allergies: No known allergies

## 2010-09-22 LAB — HM COLONOSCOPY: HM Colonoscopy: NORMAL

## 2010-09-28 ENCOUNTER — Encounter: Payer: Self-pay | Admitting: Family Medicine

## 2010-09-29 NOTE — Assessment & Plan Note (Signed)
Summary: BAD COLD  CYD   Vital Signs:  Patient profile:   60 year old male Weight:      270.25 pounds O2 Sat:      96 % on Room air Temp:     98.0 degrees F oral Pulse rate:   76 / minute Pulse rhythm:   regular BP sitting:   124 / 72  (left arm) Cuff size:   large  Vitals Entered By: Selena Batten Dance CMA (AAMA) (September 20, 2010 8:38 AM)  O2 Flow:  Room air CC: Cold x3 days Comments OTC meds no help   History of Present Illness: CC: congestion, sinus, cough  4 d h/o ST, nasal congestion, draining down back of throat, as well as cough (dry).  Tried liquid tylenol med, drinking plenty fluids, getting plenty of rest.  + hand arthralgias.  feeling very tired.  No fevers/chills, abd pain, n/v/d, rashes, myalgias.  No HA, ear pain, tooth pain, sinus pressure.  No sick contacts at home.  No smokers at home.  h/o asthma but grew out of.  Current Medications (verified): 1)  Metformin Hcl 500 Mg Tabs (Metformin Hcl) .Marland Kitchen.. 1 By Mouth Two Times A Day 2)  Glimepiride 4 Mg Tabs (Glimepiride) .Marland Kitchen.. 1 By Mouth Two Times A Day 3)  Lipitor 40 Mg Tabs (Atorvastatin Calcium) .Marland Kitchen.. 1 By Mouth Once Daily 4)  Lisinopril-Hydrochlorothiazide 10-12.5 Mg Tabs (Lisinopril-Hydrochlorothiazide) .Marland Kitchen.. 1 By Mouth Once Daily 5)  Levetiracetam 250 Mg Tabs (Levetiracetam) .Marland Kitchen.. 1 By Mouth Two Times A Day  Allergies (verified): No Known Drug Allergies  Past History:  Past Medical History: Last updated: 04/13/2010 T2DM HTN HLD partial epilepsy (neurologist Dr. 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  tired  appearing Head:  Normocephalic and atraumatic without obvious abnormalities. No apparent alopecia or balding.  no sinus tenderness Eyes:  No corneal or conjunctival inflammation noted. EOMI. Perrla.  Ears:  TMs clear bilaterally, some congestion Nose:  nares clear bilaterally, some congestion Mouth:  MMM, no phayrngeal erythema Neck:  No deformities, masses, or tenderness noted.  no LAD, no Thyromegaly, no bruits Lungs:  Normal respiratory effort, chest expands symmetrically. Lungs are clear to auscultation, no crackles or wheezes. Heart:  no m appreciated today.  + split heart sounds Abdomen:  Bowel sounds positive,abdomen soft and non-tender without masses, organomegaly or hernias noted. Pulses:  2+ rad pulses Extremities:  no edema Skin:  Intact without suspicious lesions or rashes   Impression & Recommendations:  Problem # 1:  VIRAL URI (ICD-465.9) Instructed on symptomatic treatment. Call if symptoms persist or worsen.  supportive care as per instructions.  red flags to return discussed  His updated medication list for this problem includes:    Cheratussin Ac 100-10 Mg/23ml Syrp (Guaifenesin-codeine) ..... One teaspoon q6 hours as needed cough, sedation precautions  Complete Medication List: 1)  Metformin Hcl 500 Mg Tabs (Metformin hcl) .Marland Kitchen.. 1 by mouth two times a day 2)  Glimepiride 4 Mg Tabs (Glimepiride) .Marland Kitchen.. 1 by mouth two times a day 3)  Lipitor 40 Mg Tabs (Atorvastatin calcium) .Marland Kitchen.. 1 by mouth once daily 4)  Lisinopril-hydrochlorothiazide 10-12.5 Mg Tabs (Lisinopril-hydrochlorothiazide) .Marland Kitchen.. 1 by mouth once daily 5)  Levetiracetam 250 Mg Tabs (Levetiracetam) .Marland Kitchen.. 1 by mouth two times a day 6)  Cheratussin Ac 100-10 Mg/32ml Syrp (Guaifenesin-codeine) .... One teaspoon q6 hours as needed cough, sedation precautions  Patient Instructions: 1)  Sounds like you have a viral upper respiratory infection. 2)  Antibiotics are not needed for this.  Viral infections usually take 7-10  days to resolve.  The cough can last 4 weeks to go away. 3)  Use medication as prescribed: cheratussin for night time. 4)  Nasal saline spray for congestion. 5)  Please return or let us know if you are not improving as expected, or if you have high fevers (>101.5) or difficulty breathing, worsening cough. 6)  Call clinic with questions.  Pleasure to see you today. Prescriptions: CHERATUSSIN AC 100-10 MG/5ML SYRP (GUAIFENESIN-CODEINE) one teaspoon q6 hours as needed cough, sedation precautions  #100cc x 0   Entered and Authorized by:   Eustaquio Boyden  MD   Signed by:   Eustaquio Boyden  MD on 09/20/2010   Method used:   Print then Give to Patient   RxID:   [id number]    Orders Added: 1)  Est. Patient Level III [56213]    Current Allergies (reviewed today): No known allergies

## 2010-11-16 ENCOUNTER — Encounter: Payer: Self-pay | Admitting: Family Medicine

## 2010-11-16 ENCOUNTER — Ambulatory Visit (INDEPENDENT_AMBULATORY_CARE_PROVIDER_SITE_OTHER): Payer: No Typology Code available for payment source | Admitting: Family Medicine

## 2010-11-16 VITALS — BP 128/78 | HR 84 | Temp 97.7°F | Wt 260.1 lb

## 2010-11-16 DIAGNOSIS — E119 Type 2 diabetes mellitus without complications: Secondary | ICD-10-CM

## 2010-11-16 DIAGNOSIS — I10 Essential (primary) hypertension: Secondary | ICD-10-CM

## 2010-11-16 DIAGNOSIS — Z96659 Presence of unspecified artificial knee joint: Secondary | ICD-10-CM

## 2010-11-16 DIAGNOSIS — G40109 Localization-related (focal) (partial) symptomatic epilepsy and epileptic syndromes with simple partial seizures, not intractable, without status epilepticus: Secondary | ICD-10-CM

## 2010-11-16 DIAGNOSIS — E785 Hyperlipidemia, unspecified: Secondary | ICD-10-CM

## 2010-11-16 DIAGNOSIS — R358 Other polyuria: Secondary | ICD-10-CM

## 2010-11-16 LAB — POCT URINALYSIS DIPSTICK
Blood, UA: NEGATIVE
Ketones, UA: NEGATIVE
Protein, UA: NEGATIVE
Spec Grav, UA: 1.015
Urobilinogen, UA: 0.2
pH, UA: 6

## 2010-11-16 LAB — BASIC METABOLIC PANEL
Creatinine: 1.4 mg/dL — AB (ref <=1.3)
Potassium: 4.4 mmol/L (ref 3.4–5.3)
Sodium: 136 mmol/L — AB (ref 137–147)

## 2010-11-16 LAB — GLUCOSE, POCT (MANUAL RESULT ENTRY): POC Glucose: 312

## 2010-11-16 LAB — LIPID PANEL: LDL Cholesterol: 57 mg/dL

## 2010-11-16 LAB — HEMOGLOBIN A1C: Hgb A1c MFr Bld: 9.2 % — AB (ref 4.0–6.0)

## 2010-11-16 MED ORDER — GLUCOSE BLOOD VI STRP: ORAL_STRIP | Status: DC

## 2010-11-16 MED ORDER — ONETOUCH ULTRA SYSTEM W/DEVICE KIT: 1.0000 | PACK | Freq: Once | Status: AC

## 2010-11-16 MED ORDER — GLUCOSE BLOOD VI STRP: ORAL_STRIP | Status: AC

## 2010-11-16 MED ORDER — SITAGLIPTIN PHOSPHATE 50 MG PO TABS: 50.0000 mg | ORAL_TABLET | Freq: Every day | ORAL | Status: DC

## 2010-11-16 NOTE — Assessment & Plan Note (Signed)
Good control on ACEI/HCTZ, continue. Neg ros.

## 2010-11-16 NOTE — Assessment & Plan Note (Signed)
Due to hyperglycemia from uncontrolled diabetes.   Recently deteriorated control.  Pt reports compliance with meds, only change recently has been stopping avandia.   Endorses compliance w/ diabetic diet. Denies fever, chills, other evidence of infection. See above for treatment plan.  Encouraged continued hydration.

## 2010-11-16 NOTE — Progress Notes (Signed)
  Subjective:    Patient ID: Kirk Bennett, male    DOB: [birth year], 60 y.o.   MRN: [id number]  HPI CC: f/u and polyuria  1. Polyuria - 1 mo h/o polyuria.  also feels doesn't completely empty bladder.  Not significant nocturia (1x/night).  No dysuria.  + urgency.  No hematuria.  No recent increase in fluids.  Thinks sugars have been well controlled but hasn't been checking.  Fasting today.  Slight dribbling when waits too long to void.  No frank accidents.  Typically feels urge when stands up.  Voiding 7-8x/day.  No abd pain.  Last PSA 2.0 06/2010.  DRE reassuring last checked 03/2010, 20-30gm.  2. DM - tolerating meds.  Doesn't check at home.  Hasn't felt any lows.  Trying to keep diabetic diet.  No sodas, sweets.  No paresthesias.  Weight down 10 lbs.  Has been trying - walking more.  Last vision check was 2 mo ago, told ok.  3. HTN - BP well controlled on med.  No HA, vision changes, CP/tightness, SOB, leg swelling.  4. HLD - compliant with atorvastatin, no muscle aches.  Uses breathe right nasal strips for sleeping.  Wonders if has sleep apnea.  Feels rested when awakening.  No PNDyspnea.  During day feels sleepy at times, easily falls asleep on couch.  Allergies, meds, PMhx reviewed.  Review of Systems Per HPI    Objective:   Physical Exam  Vitals reviewed. Constitutional: He appears well-developed and well-nourished. No distress.  HENT:  Head: Normocephalic and atraumatic.  Mouth/Throat: Oropharynx is clear and moist.  Eyes: Conjunctivae and EOM are normal. Pupils are equal, round, and reactive to light.  Neck: Normal range of motion. Neck supple. No thyromegaly present.  Cardiovascular: Normal rate, regular rhythm, normal heart sounds and intact distal pulses.  Exam reveals no gallop.   No murmur heard. Pulses:      Radial pulses are 2+ on the right side, and 2+ on the left side.  Pulmonary/Chest: Effort normal and breath sounds normal. No respiratory distress. He has no  wheezes. He has no rales.  Abdominal: Soft. Bowel sounds are normal. He exhibits no distension and no mass. There is no tenderness. There is no rebound and no guarding.       No CVA tenderness.   + ventral hernia  Lymphadenopathy:    He has no cervical adenopathy.  Neurological:       CN grossly intact, station and gait intact  Skin: Skin is warm and dry. No rash noted.  Diabetic foot exam: Normal inspection Mild maceration R foot between 3rd and 4th toes. No calluses  Normal DP/PT pulses Normal sensation to light tough and monofilament Nails normal         Assessment & Plan:

## 2010-11-16 NOTE — Assessment & Plan Note (Addendum)
Deteriorated based on current glucosuria and cbg today >300. Discussed importance of staying well hydrated while sugar so high in setting of polyuria. Foot exam today. Check Cr as borderline elevated in past.  Hesitant to increase metformin 2/2 this. Deterioration in control started since stopping avandia.  Hesitant to start actos 2/2 bladder CA concern.   Will start Januvia.  RTC 1 mo for f/u.  rec start checking sugars daily, return with log next visit. Sent in glucometer and strips for daily testing.

## 2010-11-16 NOTE — Patient Instructions (Addendum)
Blood work today. There was sugar in the urine.  Your blood sugar level was high as well.  That means that we aren't controlling your diabetes as well as we need to.   Start checking sugars at home.  New glucommeter sent in to [facility].  Ensure you are getting plenty of water to drink to stay well hydrated. Start [place] once daily.  I will check your kidney function as well today.

## 2010-11-16 NOTE — Assessment & Plan Note (Signed)
Good control last check. No myalgias.  Continue atorvastatin. Will check dLDL today as recent change to generic atorvastatin from lipitor.

## 2010-11-18 ENCOUNTER — Encounter: Payer: Self-pay | Admitting: Family Medicine

## 2010-11-19 ENCOUNTER — Telehealth: Payer: Self-pay | Admitting: *Deleted

## 2010-11-19 DIAGNOSIS — E119 Type 2 diabetes mellitus without complications: Secondary | ICD-10-CM

## 2010-11-19 MED ORDER — SITAGLIPTIN PHOSPHATE 100 MG PO TABS: 100.0000 mg | ORAL_TABLET | Freq: Every day | ORAL | Status: DC

## 2010-11-19 NOTE — Telephone Encounter (Signed)
Patient says that he doesn't feel like the Januvia is working. He says that his BS readings have been high. Some of the readings he has had since starting the medication are 260, 226, 259, 260, 224, 287.

## 2010-11-19 NOTE — Telephone Encounter (Signed)
Spoke with patient. He said he just doesn't "have faith" in the Januvia. He said he hasn't noticed any changes since starting it. I explained that you needed to make sure he was tolerating it well before increasing it. He said he's been exercising and paying close attention to his diet. He said the [place] was expensive too. He has previously been on Avandia previously and it was working, but his insurance quit covering it. He wants to go back on that if you think he would do better on that. He said he will pay for it since it worked. I told him I would ask and call him back.

## 2010-11-19 NOTE — Telephone Encounter (Signed)
If tolerating [place] 50mg  well, may increase to 100mg  daily starting on Monday - may take 2 pills daily.  New prescription will be for 100mg  daily (so just one pill daily).  Keep eye on sugars.  Should note change over next few weeks.

## 2010-11-19 NOTE — Telephone Encounter (Signed)
Discussed with patient.  As he has [place] at home, will increase to 2 pills daily.  If not noticing improvement, will change to another med.  Discussed actos vs avandia and how avandia has cardiovascular risk.  Pt hesitant to start actos 2/2 concern with bladder cancer.

## 2010-12-16 ENCOUNTER — Encounter: Payer: Self-pay | Admitting: Family Medicine

## 2010-12-16 ENCOUNTER — Ambulatory Visit (INDEPENDENT_AMBULATORY_CARE_PROVIDER_SITE_OTHER): Payer: No Typology Code available for payment source | Admitting: Family Medicine

## 2010-12-16 VITALS — BP 112/60 | HR 84 | Temp 98.0°F | Wt 249.0 lb

## 2010-12-16 DIAGNOSIS — E1165 Type 2 diabetes mellitus with hyperglycemia: Secondary | ICD-10-CM

## 2010-12-16 DIAGNOSIS — E785 Hyperlipidemia, unspecified: Secondary | ICD-10-CM

## 2010-12-16 DIAGNOSIS — I10 Essential (primary) hypertension: Secondary | ICD-10-CM

## 2010-12-16 NOTE — Assessment & Plan Note (Signed)
Great control, continue lisinopril.

## 2010-12-16 NOTE — Progress Notes (Signed)
  Subjective:    Patient ID: Kirk Bennett, male    DOB: 1950-09-07, 60 y.o.   MRN: [id number]  HPI CC: f/u DM  Bought diabetes diet book.  "30 day diabetes cure".  Feels that with this diet; blood sugars have been better controlled.  Last week had several low sugars (56-70), felt shakey, sweaty.  Highest sugar 190.  Last vision check 3 mo ago ([facility], now will return to [place] doctor July 13 for glaucoma check).  Interested in nutrition refresher course.  No paresthesias.  BP great.  No HA, vision changes, CP/tightness, SOB, leg swelling.    Compliant with lipitor for HLD.  No myalgias.  Does have R posterior knee pain after long walks  Has lost 11 lbs with diet and lifestyle changes.  Wt Readings from Last 3 Encounters:  12/16/10 249 lb 0.6 oz (112.964 kg)  11/16/10 260 lb 1.3 oz (117.972 kg)  09/20/10 270 lb 4 oz (122.585 kg)   Review of Systems Per HPI    Objective:   Physical Exam  Vitals reviewed. Constitutional: He appears well-developed and well-nourished. No distress.  HENT:  Head: Normocephalic and atraumatic.  Mouth/Throat: Oropharynx is clear and moist. No oropharyngeal exudate.  Eyes: Conjunctivae are normal. Pupils are equal, round, and reactive to light. No scleral icterus.  Neck: Normal range of motion. Neck supple. Carotid bruit is not present.  Cardiovascular: Normal rate, regular rhythm, normal heart sounds and intact distal pulses.   No murmur heard. Pulmonary/Chest: Effort normal and breath sounds normal. No respiratory distress. He has no wheezes. He has no rales.  Abdominal: Soft. Bowel sounds are normal.       No abd/renal bruits  Lymphadenopathy:    He has no cervical adenopathy.  Skin: Skin is warm. No rash noted.  Psychiatric: He has a normal mood and affect.   Diabetic foot exam: Normal inspection No skin breakdown No calluses  Normal DP/PT pulses Normal sensation to light tough and monofilament Nails normal        Assessment  & Plan:

## 2010-12-16 NOTE — Assessment & Plan Note (Signed)
Great control, continue lipitor.  Tolerating without side fx.

## 2010-12-16 NOTE — Patient Instructions (Signed)
Stop glimepiride. Continue januvia and metformin for now. Blood sugars sound like they are in good range. Pass by marion's office to set up nutrition appointment.

## 2010-12-16 NOTE — Assessment & Plan Note (Signed)
Chronic problem.  Last A1c 9.2% (11/16/2010). Improved control as evidenced by reported cbg readings. Given endorsement of hypoglycemic sxs, stop glimepiride.  Continue januvia, metformin. Return 2 mo for recheck, blood work, A1c. Continue exercise, diet watching.  Referral to nutrition for refresher course. Foot exam today. Recent vision check. Borderline Cr for metformin, continue for now, recheck next visit.  Hesitant to increase metformin.

## 2010-12-17 ENCOUNTER — Other Ambulatory Visit: Payer: Self-pay | Admitting: *Deleted

## 2010-12-17 MED ORDER — METFORMIN HCL 500 MG PO TABS: 500.0000 mg | ORAL_TABLET | Freq: Two times a day (BID) | ORAL | Status: DC

## 2010-12-17 NOTE — Telephone Encounter (Signed)
Sent in 30 day supply previously. Patient now requests 90 day supply.

## 2010-12-17 NOTE — Telephone Encounter (Signed)
Patient called and said the pharmacy said they have requested refills for med x4. No record of requests. Refilled for patient.

## 2011-01-03 ENCOUNTER — Telehealth: Payer: Self-pay | Admitting: *Deleted

## 2011-01-03 NOTE — Telephone Encounter (Signed)
Patient was put on Januvia and it is too expensive. Patient states that his blood sugar is coming down and he wants to go back on Glimepride instead of staying on the Januvia because of the cost. Pharmacy- [facility]

## 2011-01-04 ENCOUNTER — Other Ambulatory Visit: Payer: Self-pay | Admitting: *Deleted

## 2011-01-04 MED ORDER — GLIMEPIRIDE 2 MG PO TABS: 2.0000 mg | ORAL_TABLET | Freq: Every day | ORAL | Status: DC

## 2011-01-04 MED ORDER — GLIMEPIRIDE 4 MG PO TABS: 2.0000 mg | ORAL_TABLET | Freq: Every day | ORAL | Status: DC

## 2011-01-04 NOTE — Telephone Encounter (Signed)
Noted.  Can we switch script to 2mg  daily (more convenient)?  Thanks.  I put order in chart to be phoned in.

## 2011-01-04 NOTE — Telephone Encounter (Signed)
Sent in

## 2011-01-04 NOTE — Telephone Encounter (Signed)
If having lows, may stop [place]. Hold glimeperide and see what sugars do only on metformin. If creeping up, may start 1/2 pill of glimeperide (2mg  daily).   Update me on how sugars do with these instructions. Does he need glimeperide sent to pharmacy or does he still have pills.

## 2011-01-04 NOTE — Telephone Encounter (Signed)
He'll want a 90 day Rx if that's okay.

## 2011-01-04 NOTE — Telephone Encounter (Signed)
Patient notified. He will stop Januvia and just take metformin. If sugars begin to elevate, he will add 2 mg of Glimeperide as directed. He will notify us of how sugars are doing. Rx sent in for # 45 to take 1/2 daily with 3 refills [facility] [address]

## 2011-02-02 ENCOUNTER — Telehealth: Payer: Self-pay | Admitting: Family Medicine

## 2011-02-02 DIAGNOSIS — E785 Hyperlipidemia, unspecified: Secondary | ICD-10-CM

## 2011-02-02 DIAGNOSIS — E1165 Type 2 diabetes mellitus with hyperglycemia: Secondary | ICD-10-CM

## 2011-02-02 DIAGNOSIS — IMO0002 Reserved for concepts with insufficient information to code with codable children: Secondary | ICD-10-CM

## 2011-02-02 NOTE — Telephone Encounter (Signed)
Message copied by Eustaquio Boyden on Wed Feb 02, 2011  9:08 AM ------      Message from: Alvina Chou      Created: Tue Feb 01, 2011 12:50 PM       Patient is scheduled for CPX labs, please order future labs, Thanks , Camelia Eng

## 2011-02-02 NOTE — Telephone Encounter (Signed)
Patient's last physical was 04/21/2010, not due until after 04/22/2011.  Can we call and reschedule?  Pt may come in at scheduled appointment as follow up.  Placed blood work order in chart.  May come in for blood work prior.

## 2011-02-03 NOTE — Telephone Encounter (Signed)
Message left with male to return my call.

## 2011-02-03 NOTE — Telephone Encounter (Signed)
Changed CPX to a follow up. Scheduled CPX for September.

## 2011-02-07 ENCOUNTER — Other Ambulatory Visit (INDEPENDENT_AMBULATORY_CARE_PROVIDER_SITE_OTHER): Payer: No Typology Code available for payment source | Admitting: Family Medicine

## 2011-02-07 DIAGNOSIS — E1165 Type 2 diabetes mellitus with hyperglycemia: Secondary | ICD-10-CM

## 2011-02-07 DIAGNOSIS — E785 Hyperlipidemia, unspecified: Secondary | ICD-10-CM

## 2011-02-07 NOTE — Progress Notes (Signed)
Addended by: Alvina Chou on: 02/07/2011 08:28 AM   Modules accepted: Orders

## 2011-02-09 LAB — COMPREHENSIVE METABOLIC PANEL
ALT: 20 U/L (ref 10–40)
AST: 17 U/L
Albumin: 4.3
Alkaline Phosphatase: 127 U/L
Chloride: 105 mmol/L
Creat: 1.39
Glucose: 128
Total Bilirubin: 1 mg/dL

## 2011-02-09 LAB — LIPID PANEL: Triglycerides: 59

## 2011-02-14 ENCOUNTER — Encounter: Payer: Self-pay | Admitting: Family Medicine

## 2011-02-15 ENCOUNTER — Ambulatory Visit (INDEPENDENT_AMBULATORY_CARE_PROVIDER_SITE_OTHER): Payer: No Typology Code available for payment source | Admitting: Family Medicine

## 2011-02-15 ENCOUNTER — Encounter: Payer: Self-pay | Admitting: Family Medicine

## 2011-02-15 DIAGNOSIS — IMO0002 Reserved for concepts with insufficient information to code with codable children: Secondary | ICD-10-CM

## 2011-02-15 DIAGNOSIS — E1165 Type 2 diabetes mellitus with hyperglycemia: Secondary | ICD-10-CM

## 2011-02-15 DIAGNOSIS — IMO0001 Reserved for inherently not codable concepts without codable children: Secondary | ICD-10-CM

## 2011-02-15 DIAGNOSIS — E785 Hyperlipidemia, unspecified: Secondary | ICD-10-CM

## 2011-02-15 DIAGNOSIS — I10 Essential (primary) hypertension: Secondary | ICD-10-CM

## 2011-02-15 NOTE — Assessment & Plan Note (Signed)
Good control. Continue ACEI/HCTZ combo.

## 2011-02-15 NOTE — Assessment & Plan Note (Signed)
Good control. ldl 52. Tolerating med.

## 2011-02-15 NOTE — Progress Notes (Signed)
  Subjective:    Patient ID: Kirk Bennett, male    DOB: 11-02-1950, 60 y.o.   MRN: [id number]  HPI CC: f/u DM  Much improved overall, feeling well.  Has started walking in pool.  1. HTN - No HA, vision changes, CP/tightness, SOB, leg swelling.  Only on lisinopril/hctz combo, great control at home, sbp 110s.  2. DM - on metformin and glimepiride.  Has worked on diet and lifestyle changes.  No low sugars or hypoglycemic sxs.  A1c 5.8% last week.  UTD vision screen, has rpt 03/04/2011.  Foot exam today. Lab Results  Component Value Date   HGBA1C 9.2* 11/16/2010   HGBA1C 10.0* 11/16/2010   3. HLD - on lipitor, stable.  No myalgias.  Reviewed blood work. Medications and allergies reviewed and updated in chart.   Review of Systems Per HPI    Objective:   Physical Exam  Nursing note and vitals reviewed. Constitutional: He appears well-developed and well-nourished. No distress.  HENT:  Head: Normocephalic and atraumatic.  Neck: Normal range of motion. Neck supple. Carotid bruit is not present.  Cardiovascular: Normal rate, regular rhythm, normal heart sounds and intact distal pulses.   No murmur heard. Pulmonary/Chest: Effort normal and breath sounds normal. No respiratory distress. He has no wheezes. He has no rales.  Lymphadenopathy:    He has no cervical adenopathy.  Skin: Skin is warm and dry. No rash noted. No erythema.  Psychiatric: He has a normal mood and affect.   Diabetic foot exam: Normal inspection No skin breakdown No calluses  Normal DP/PT pulses Normal sensation to light tough and monofilament Nails normal        Assessment & Plan:

## 2011-02-15 NOTE — Patient Instructions (Signed)
Return in 3 months for follow up. We will check A1c again then. Keep up the great work!  A1c was 5.8% today. Monitor for low sugars, if that happens, let me know.

## 2011-02-15 NOTE — Assessment & Plan Note (Signed)
Improved A1c to 5.8% via lifestyle changes. Off januvia - too expensive. On metformin 500 bid and glimepiride.  Last Cr 1.39. rtc 3 mo for f/u.  A1c and urine micro then. Foot exam today. Scheduled for vision screen 02/2011.

## 2011-02-20 DIAGNOSIS — H269 Unspecified cataract: Secondary | ICD-10-CM

## 2011-02-20 HISTORY — DX: Unspecified cataract: H26.9

## 2011-02-21 ENCOUNTER — Encounter: Payer: Self-pay | Admitting: Family Medicine

## 2011-03-01 ENCOUNTER — Other Ambulatory Visit: Payer: Self-pay | Admitting: *Deleted

## 2011-03-01 MED ORDER — LISINOPRIL-HYDROCHLOROTHIAZIDE 10-12.5 MG PO TABS: 1.0000 | ORAL_TABLET | Freq: Every day | ORAL | Status: DC

## 2011-03-10 ENCOUNTER — Encounter: Payer: Self-pay | Admitting: Family Medicine

## 2011-03-11 ENCOUNTER — Other Ambulatory Visit: Payer: Self-pay | Admitting: *Deleted

## 2011-03-11 MED ORDER — GLIMEPIRIDE 2 MG PO TABS: 2.0000 mg | ORAL_TABLET | Freq: Every day | ORAL | Status: DC

## 2011-03-11 NOTE — Telephone Encounter (Signed)
No glimepiride should be only once a day. Metformin should be 500mg  twice daily. Please notify patient and may send in refill.

## 2011-03-11 NOTE — Telephone Encounter (Signed)
Noted thanks °

## 2011-03-11 NOTE — Telephone Encounter (Signed)
Spoke with patient. He said you had told him to take the glimepiride twice daily at his last visit. I asked him to read me the SIG on the last bottle he got refilled. He read it to me and it said take 1 tablet once daily. I told him to follow those directions and to only take 1 glimepiride a day and then take the metformin 1 pill twice daily. He repeated both back to me and verbalized understanding. I sent in a refill for the glimepiride.

## 2011-03-11 NOTE — Telephone Encounter (Signed)
Patient is asking for a refill, but he says that he has been taking bid and we have it as him taking only 1 daily. He says that you changed in to bid at last appt. Please verify which way he is to be taking it.

## 2011-04-26 ENCOUNTER — Ambulatory Visit (INDEPENDENT_AMBULATORY_CARE_PROVIDER_SITE_OTHER): Payer: No Typology Code available for payment source | Admitting: Family Medicine

## 2011-04-26 ENCOUNTER — Encounter: Payer: Self-pay | Admitting: Family Medicine

## 2011-04-26 VITALS — BP 116/72 | HR 84 | Temp 98.3°F | Ht 75.0 in | Wt 254.2 lb

## 2011-04-26 DIAGNOSIS — Z0001 Encounter for general adult medical examination with abnormal findings: Secondary | ICD-10-CM | POA: Insufficient documentation

## 2011-04-26 DIAGNOSIS — E785 Hyperlipidemia, unspecified: Secondary | ICD-10-CM

## 2011-04-26 DIAGNOSIS — Z0000 Encounter for general adult medical examination without abnormal findings: Secondary | ICD-10-CM | POA: Insufficient documentation

## 2011-04-26 DIAGNOSIS — Z23 Encounter for immunization: Secondary | ICD-10-CM

## 2011-04-26 DIAGNOSIS — E119 Type 2 diabetes mellitus without complications: Secondary | ICD-10-CM

## 2011-04-26 DIAGNOSIS — I10 Essential (primary) hypertension: Secondary | ICD-10-CM

## 2011-04-26 DIAGNOSIS — Z1211 Encounter for screening for malignant neoplasm of colon: Secondary | ICD-10-CM

## 2011-04-26 DIAGNOSIS — Z125 Encounter for screening for malignant neoplasm of prostate: Secondary | ICD-10-CM

## 2011-04-26 DIAGNOSIS — G40109 Localization-related (focal) (partial) symptomatic epilepsy and epileptic syndromes with simple partial seizures, not intractable, without status epilepticus: Secondary | ICD-10-CM

## 2011-04-26 NOTE — Patient Instructions (Addendum)
Good to see you today.  Call us with questions. Return at end of month, and a few days before for fasting blood work. Tdap today. We will talk about pneumonia and hep B shots in future. Good to see you today, call us with questions.

## 2011-04-26 NOTE — Assessment & Plan Note (Signed)
On keppra  

## 2011-04-26 NOTE — Progress Notes (Signed)
Subjective:    Patient ID: Kirk Coma., male    DOB: [birth year], 60 y.o.   MRN: [id number]  HPI CC: CPE today  no questions or concerns. Discussed tdap, due today. Had hepatitis A at age 72yo, hospitalized [facility] [place].   No hep B in past.  No PNA shot in past. Declines these currently.  Attributes anger problems to Keppra, but not major issue.  Sees neurologist twice a year ([facility]. [facility]).  colonoscopy 2007, hyperplastic polyps x 2, due for rpt 5-7 years. No BM changes, no blood in stool.  Would like iFOB today, rpt colonoscopy 2014. PSA 1.7 04/2009, 2.0 06/2010, nocturia x1.  Due for blood work at end of month so will draw PSA then.  Medications and allergies reviewed and updated in chart.  Past histories reviewed and updated if relevant as below. Patient Active Problem List  Diagnoses  . Diabetes type 2, controlled  . HYPERLIPIDEMIA  . SEIZURE DISORDER, PARTIAL  . HYPERTENSION  . TOTAL KNEE REPLACEMENT, LEFT, HX OF   Past Medical History  Diagnosis Date  . Diabetes mellitus type II   . HTN (hypertension)   . HLD (hyperlipidemia)   . Epilepsy, partial   . Knee pain     s/p replacement  . History of hepatitis A   . SVT (supraventricular tachycardia) 1996  . Cataracts, bilateral 02/2011    and suspected glaucoma, to return for f/u, no diabetic retinopathy   Past Surgical History  Procedure Date  . Corrective surgery amblyopia 1957  . Left knee surgery 1971    Torn ACL  . Cystectomy or meningioma removal brain 1990    (unclear)  . Replacement total knee April 2011    Left [facility] Dr. Tobie Poet)  . Cardiac catheterization July 2010    No blockages (Dr. Christene Slates)   History  Substance Use Topics  . Smoking status: Never Smoker   . Smokeless tobacco: Not on file  . Alcohol Use: No   Family History  Problem Relation Age of Onset  . Heart failure Father   . Cancer Mother     Lung  . Aortic dissection Mother     Aortic rupture  . Mental illness Sister      52 years old  . Diabetes Maternal Aunt   . Diabetes Cousin   . Cancer Cousin     Colon age 65   No Known Allergies Current Outpatient Prescriptions on File Prior to Visit  Medication Sig Dispense Refill  . atorvastatin (LIPITOR) 40 MG tablet Take 40 mg by mouth daily.        Marland Kitchen glimepiride (AMARYL) 2 MG tablet Take 1 tablet (2 mg total) by mouth daily before breakfast.  90 tablet  3  . glucose blood test strip Test sugar fasting in am and 2 hours after lunch or dinner  100 each  12  . levETIRAcetam (KEPPRA) 1000 MG tablet Take 1,000 mg by mouth 2 (two) times daily.       Marland Kitchen lisinopril-hydrochlorothiazide (PRINZIDE,ZESTORETIC) 10-12.5 MG per tablet Take 1 tablet by mouth daily.  90 tablet  3  . metFORMIN (GLUCOPHAGE) 500 MG tablet Take 1 tablet (500 mg total) by mouth 2 (two) times daily with a meal.  180 tablet  4   Review of Systems  Constitutional: Negative for fever, chills, activity change, appetite change, fatigue and unexpected weight change.  HENT: Negative for hearing loss and neck pain.   Eyes: Negative for visual disturbance.  Respiratory: Negative for  cough, chest tightness, shortness of breath and wheezing.   Cardiovascular: Negative for chest pain, palpitations and leg swelling.  Gastrointestinal: Negative for nausea, vomiting, abdominal pain, diarrhea, constipation, blood in stool and abdominal distention.  Genitourinary: Negative for hematuria and difficulty urinating.  Musculoskeletal: Negative for myalgias and arthralgias.  Skin: Negative for rash.  Neurological: Negative for dizziness, seizures, syncope and headaches.  Hematological: Does not bruise/bleed easily.  Psychiatric/Behavioral: Negative for dysphoric mood. The patient is not nervous/anxious.        Objective:   Physical Exam  Nursing note and vitals reviewed. Constitutional: He is oriented to person, place, and time. He appears well-developed and well-nourished. No distress.  HENT:  Head:  Normocephalic and atraumatic.  Right Ear: External ear normal.  Left Ear: External ear normal.  Nose: Nose normal.  Mouth/Throat: Oropharynx is clear and moist. No oropharyngeal exudate.  Eyes: Conjunctivae and EOM are normal. Pupils are equal, round, and reactive to light. No scleral icterus.  Neck: Normal range of motion. Neck supple. Carotid bruit is not present. No thyromegaly present.  Cardiovascular: Normal rate, regular rhythm, normal heart sounds and intact distal pulses.   No murmur heard. Pulses:      Radial pulses are 2+ on the right side, and 2+ on the left side.  Pulmonary/Chest: Effort normal and breath sounds normal. No respiratory distress. He has no wheezes. He has no rales.  Abdominal: Soft. Bowel sounds are normal. He exhibits no distension and no mass. There is no tenderness. There is no rebound and no guarding.  Genitourinary: Rectum normal and prostate normal. Rectal exam shows no external hemorrhoid, no fissure, no tenderness and anal tone normal. Guaiac negative stool.  Musculoskeletal: Normal range of motion. He exhibits no edema.  Lymphadenopathy:    He has no cervical adenopathy.  Neurological: He is alert and oriented to person, place, and time.       CN grossly intact, station and gait intact  Skin: Skin is warm and dry. No rash noted.  Psychiatric: He has a normal mood and affect. His behavior is normal. Judgment and thought content normal.          Assessment & Plan:

## 2011-04-26 NOTE — Assessment & Plan Note (Addendum)
Preventative protocols updated unless pt declined. UTD colonoscopy - iFOB for next 2 years then colonoscopy 2014 (h/o polyps, last done 2007). Tdap today. Pt declines pneumonia and Hep B currently. Interested in shingles at age 60yo

## 2011-04-26 NOTE — Assessment & Plan Note (Signed)
bp good control. Continue meds.

## 2011-04-26 NOTE — Assessment & Plan Note (Signed)
Anticipate good control, check when returns fasting at end of month.

## 2011-05-16 ENCOUNTER — Other Ambulatory Visit: Payer: No Typology Code available for payment source

## 2011-05-18 ENCOUNTER — Encounter: Payer: Self-pay | Admitting: Family Medicine

## 2011-05-18 ENCOUNTER — Encounter: Payer: Self-pay | Admitting: Gastroenterology

## 2011-05-18 ENCOUNTER — Ambulatory Visit (INDEPENDENT_AMBULATORY_CARE_PROVIDER_SITE_OTHER): Payer: No Typology Code available for payment source | Admitting: Family Medicine

## 2011-05-18 DIAGNOSIS — Z1211 Encounter for screening for malignant neoplasm of colon: Secondary | ICD-10-CM

## 2011-05-18 DIAGNOSIS — I10 Essential (primary) hypertension: Secondary | ICD-10-CM

## 2011-05-18 DIAGNOSIS — R109 Unspecified abdominal pain: Secondary | ICD-10-CM

## 2011-05-18 DIAGNOSIS — Z0000 Encounter for general adult medical examination without abnormal findings: Secondary | ICD-10-CM

## 2011-05-18 DIAGNOSIS — Z125 Encounter for screening for malignant neoplasm of prostate: Secondary | ICD-10-CM

## 2011-05-18 DIAGNOSIS — E119 Type 2 diabetes mellitus without complications: Secondary | ICD-10-CM

## 2011-05-18 LAB — MICROALBUMIN / CREATININE URINE RATIO: Microalb/Crt. Ratio: 1

## 2011-05-18 LAB — HEMOGLOBIN A1C: Hgb A1c MFr Bld: 6.4 % (ref 4.6–6.5)

## 2011-05-18 NOTE — Progress Notes (Addendum)
  Subjective:    Patient ID: Kirk Coma., male    DOB: 05-06-1951, 60 y.o.   MRN: [id number]  HPI CC: 3 mo f/u  1. HTN - No HA, vision changes, CP/tightness, SOB, leg swelling. Only on lisinopril/hctz combo, great control at home, sbp 110s.   2. DM - on metformin and glimepiride. No low sugars or hypoglycemic sxs. A1c 5.8% 3 months ago.  UTD vision screen, 02/2011.  Foot exam today.  Lab Results  Component Value Date   HGBA1C 5.8 02/09/2011                    Wt Readings from Last 3 Encounters:  05/18/11 259 lb 8 oz (117.708 kg)  04/26/11 254 lb 4 oz (115.327 kg)  02/15/11 249 lb 8 oz (113.172 kg)  not walking as much as used to.  3. HLD - on lipitor, stable. No myalgias.  4. Right lower back pain - no dysuria, urgency, frequency, nocturia.  No nausea/vomiting, fevers/chills.  Very positional.  Laying supine feels fine, worse with laying on side.  Hasn't tried anything for this yet.  colonoscopy 2005, hyperplastic polyps x 2, due for rpt 5-7 years. No BM changes, no blood in stool.  Review of Systems per HPI    Objective:   Physical Exam  Nursing note and vitals reviewed. Constitutional: He appears well-developed and well-nourished. No distress.  HENT:  Head: Normocephalic and atraumatic.  Right Ear: External ear normal.  Left Ear: External ear normal.  Nose: Nose normal.  Mouth/Throat: Oropharynx is clear and moist. No oropharyngeal exudate.  Eyes: Conjunctivae and EOM are normal. Pupils are equal, round, and reactive to light. No scleral icterus.  Neck: Normal range of motion. Neck supple.  Cardiovascular: Normal rate, regular rhythm, normal heart sounds and intact distal pulses.   No murmur heard. Pulmonary/Chest: Effort normal and breath sounds normal. No respiratory distress. He has no wheezes. He has no rales.  Musculoskeletal: He exhibits no edema.       Thoracic back: Normal.       Lumbar back: Normal.       Diabetic foot exam: Normal inspection No skin  breakdown No calluses  Normal DP/PT pulses Normal sensation to light tough and monofilament Nails normal  No pain at flank or RUQ, CVA tenderness, unable to reproduce pain.  Lymphadenopathy:    He has no cervical adenopathy.  Skin: Skin is warm and dry. No rash noted.  Psychiatric: He has a normal mood and affect.          Assessment & Plan:

## 2011-05-18 NOTE — Assessment & Plan Note (Signed)
Check UA to r/o infection although more consistent with MSK pain. rec tylenol, ice/heat. Update [place] if not improving.

## 2011-05-18 NOTE — Patient Instructions (Signed)
blood work today. Blood pressure looking great. We will check kidneys and sugar today. Pass by [facility]'s office for referral for colonoscopy. Good to see you, return 6 months for follow up, or 3 months if A1c >7%.

## 2011-05-18 NOTE — Assessment & Plan Note (Signed)
Stable, chronic. Last several visits good control.  Recheck today.  If stable, rtc 6 mo. Check Cr today.

## 2011-05-18 NOTE — Assessment & Plan Note (Signed)
Chronic, stable.  Good control BP. Continue meds.

## 2011-05-19 ENCOUNTER — Encounter: Payer: Self-pay | Admitting: Family Medicine

## 2011-05-19 LAB — POCT URINALYSIS DIPSTICK
Blood, UA: NEGATIVE
Ketones, UA: NEGATIVE
Protein, UA: NEGATIVE
Spec Grav, UA: 1.02
Urobilinogen, UA: 0.2
pH, UA: 6

## 2011-05-19 NOTE — Assessment & Plan Note (Signed)
Pt thought colonoscopy was 2005 so set up with colonoscopy for this year, but received record from colonoscopy done 2007, with rec rpt 5-7 yrs.  Will discuss with pt and see which he prefers this year (iFOB or rpt colonoscopy).

## 2011-05-19 NOTE — Progress Notes (Signed)
Addended by: Josph Macho A on: 05/19/2011 08:11 AM   Modules accepted: Orders

## 2011-05-20 ENCOUNTER — Telehealth: Payer: Self-pay

## 2011-05-20 NOTE — Telephone Encounter (Signed)
Previous GI records received and reviewed by Dr Christella Hartigan recommends to cx colon and previsit and offer NGI to discuss the need for colon every 10 years not 5-7.  Pt was notified and did not want to schedule office appt and was ok with cx the colon and will call with any problems.  Recall put in epic.

## 2011-06-01 ENCOUNTER — Ambulatory Visit (INDEPENDENT_AMBULATORY_CARE_PROVIDER_SITE_OTHER): Payer: No Typology Code available for payment source

## 2011-06-01 DIAGNOSIS — Z23 Encounter for immunization: Secondary | ICD-10-CM

## 2011-07-05 ENCOUNTER — Telehealth: Payer: Self-pay | Admitting: Radiology

## 2011-07-05 ENCOUNTER — Other Ambulatory Visit: Payer: No Typology Code available for payment source | Admitting: Gastroenterology

## 2011-07-05 NOTE — Telephone Encounter (Signed)
can we call pt and check on iFOB status?

## 2011-07-05 NOTE — Telephone Encounter (Signed)
[facility] notified us that this patient never returned ifob test. 

## 2011-07-06 NOTE — Telephone Encounter (Signed)
Message left for patient to return my call.  

## 2011-07-12 NOTE — Telephone Encounter (Signed)
ifob kit left at the front desk for patient pick up

## 2011-07-12 NOTE — Telephone Encounter (Signed)
Spoke with patient. He said he forgot about iFOB and thinks he actually lost the kit. He said he will come by tomorrow and pick up another one.

## 2011-08-07 ENCOUNTER — Encounter: Payer: Self-pay | Admitting: Family Medicine

## 2011-08-10 ENCOUNTER — Other Ambulatory Visit: Payer: Self-pay | Admitting: *Deleted

## 2011-08-10 MED ORDER — ATORVASTATIN CALCIUM 40 MG PO TABS: 40.0000 mg | ORAL_TABLET | Freq: Every day | ORAL | Status: DC

## 2011-08-12 ENCOUNTER — Other Ambulatory Visit: Payer: Self-pay | Admitting: *Deleted

## 2011-08-12 MED ORDER — ATORVASTATIN CALCIUM 40 MG PO TABS: 40.0000 mg | ORAL_TABLET | Freq: Every day | ORAL | Status: DC

## 2011-08-31 ENCOUNTER — Telehealth: Payer: Self-pay | Admitting: Radiology

## 2011-08-31 NOTE — Telephone Encounter (Signed)
Noted. Thanks.

## 2011-08-31 NOTE — Telephone Encounter (Signed)
Patient never returned ifob, notified patient today," he may do it, not sure".

## 2011-09-15 ENCOUNTER — Encounter: Payer: Self-pay | Admitting: *Deleted

## 2011-09-15 ENCOUNTER — Other Ambulatory Visit: Payer: No Typology Code available for payment source

## 2011-09-15 DIAGNOSIS — Z1211 Encounter for screening for malignant neoplasm of colon: Secondary | ICD-10-CM

## 2011-11-18 ENCOUNTER — Ambulatory Visit: Payer: No Typology Code available for payment source | Admitting: Family Medicine

## 2011-11-21 ENCOUNTER — Encounter: Payer: Self-pay | Admitting: Family Medicine

## 2011-11-21 ENCOUNTER — Ambulatory Visit (INDEPENDENT_AMBULATORY_CARE_PROVIDER_SITE_OTHER): Payer: No Typology Code available for payment source | Admitting: Family Medicine

## 2011-11-21 VITALS — BP 118/76 | HR 80 | Temp 97.8°F | Wt 243.2 lb

## 2011-11-21 DIAGNOSIS — E119 Type 2 diabetes mellitus without complications: Secondary | ICD-10-CM

## 2011-11-21 DIAGNOSIS — I10 Essential (primary) hypertension: Secondary | ICD-10-CM

## 2011-11-21 DIAGNOSIS — E785 Hyperlipidemia, unspecified: Secondary | ICD-10-CM

## 2011-11-21 LAB — BASIC METABOLIC PANEL
BUN: 16 mg/dL (ref 4–21)
Glucose: 327

## 2011-11-21 LAB — HEMOGLOBIN A1C: A1c: 10.5

## 2011-11-21 LAB — LIPID PANEL
Direct LDL: 49
HDL: 33 mg/dL — AB (ref 35–70)

## 2011-11-21 NOTE — Progress Notes (Signed)
Addended by: Baldomero Lamy on: 11/21/2011 08:37 AM   Modules accepted: Orders

## 2011-11-21 NOTE — Assessment & Plan Note (Signed)
Chronic, stable. Check FLP today. 

## 2011-11-21 NOTE — Assessment & Plan Note (Signed)
Chronic, stable. Check A1c today. Continue meds and diet/exercise. Anticipate great control.

## 2011-11-21 NOTE — Progress Notes (Addendum)
  Subjective:    Patient ID: Kirk Bennett., male    DOB: 05/21/1951, 61 y.o.   MRN: [id number]  HPI CC: 3 mo f/u DM  No questions or concerns today.  1. HTN - No HA, vision changes, CP/tightness, SOB, leg swelling. No dizziness.  Only on lisinopril/hctz combo, great control at home, sbp 110s.   2. DM - on metformin and glimepiride. No hypoglycemic sxs. UTD vision screen, 02/2011. Foot exam today.   Not checking sugars much at all. Lab Results  Component Value Date   HGBA1C 6.4 05/18/2011     3. HLD - on lipitor, stable. No myalgias.   Walks a couple miles daily.  Watching diet.  Not eating red meat.  only chicken, fish, pork.  colonoscopy 2007, hyperplastic polyps x 2, due for rpt 5-7 years. iFOB negative 08/2011.  Rpt colonoscopy due 04/2013.  Last CPE 04/26/2011.  Wt Readings from Last 3 Encounters:  11/21/11 243 lb 4 oz (110.337 kg)  05/18/11 259 lb 8 oz (117.708 kg)  04/26/11 254 lb 4 oz (115.327 kg)  wt down 16 lbs!  Review of Systems Per HPI    Objective:   Physical Exam  Nursing note and vitals reviewed. Constitutional: He appears well-developed and well-nourished. No distress.  HENT:  Head: Normocephalic and atraumatic.  Right Ear: External ear normal.  Left Ear: External ear normal.  Nose: Nose normal.  Mouth/Throat: Oropharynx is clear and moist. No oropharyngeal exudate.  Eyes: Conjunctivae and EOM are normal. Pupils are equal, round, and reactive to light. No scleral icterus.  Neck: Normal range of motion. Neck supple.  Cardiovascular: Normal rate, regular rhythm, normal heart sounds and intact distal pulses.   No murmur heard. Pulmonary/Chest: Effort normal and breath sounds normal. No respiratory distress. He has no wheezes. He has no rales.  Musculoskeletal: He exhibits no edema.       Diabetic foot exam: Normal inspection No skin breakdown Heel calluses bilaterally Normal DP/PT pulses Normal sensation to light touch and monofilament Nails  thickened.  Lymphadenopathy:    He has no cervical adenopathy.  Skin: Skin is warm and dry. No rash noted.  Psychiatric: He has a normal mood and affect.       Assessment & Plan:

## 2011-11-21 NOTE — Patient Instructions (Addendum)
Think about pneumonia shot (pneumovax). Blood work today. Things are looking great today!  Keep up the good work. Call us with questions. Return in 04/2012 for physical

## 2011-11-21 NOTE — Assessment & Plan Note (Signed)
Chronic. Stable. Continue combo pill.

## 2011-11-23 ENCOUNTER — Encounter: Payer: Self-pay | Admitting: Family Medicine

## 2011-11-23 ENCOUNTER — Other Ambulatory Visit: Payer: Self-pay | Admitting: Family Medicine

## 2011-11-23 ENCOUNTER — Telehealth: Payer: Self-pay | Admitting: Family Medicine

## 2011-11-23 MED ORDER — METFORMIN HCL 1000 MG PO TABS: 1000.0000 mg | ORAL_TABLET | Freq: Two times a day (BID) | ORAL | Status: DC

## 2011-11-23 NOTE — Telephone Encounter (Signed)
Patient notified

## 2011-11-23 NOTE — Telephone Encounter (Signed)
Patient called today, need results of labs,  drawn on October 24, 2011. Pt says he has call several times.Marland KitchenMarland KitchenMarland Kitchen

## 2012-01-02 ENCOUNTER — Telehealth: Payer: Self-pay | Admitting: Family Medicine

## 2012-01-02 ENCOUNTER — Ambulatory Visit (INDEPENDENT_AMBULATORY_CARE_PROVIDER_SITE_OTHER): Payer: No Typology Code available for payment source | Admitting: Family Medicine

## 2012-01-02 ENCOUNTER — Encounter: Payer: Self-pay | Admitting: Family Medicine

## 2012-01-02 VITALS — BP 118/78 | HR 86 | Temp 97.3°F | Wt 244.8 lb

## 2012-01-02 DIAGNOSIS — E119 Type 2 diabetes mellitus without complications: Secondary | ICD-10-CM

## 2012-01-02 DIAGNOSIS — J40 Bronchitis, not specified as acute or chronic: Secondary | ICD-10-CM

## 2012-01-02 MED ORDER — METFORMIN HCL 500 MG PO TABS: 500.0000 mg | ORAL_TABLET | Freq: Two times a day (BID) | ORAL | Status: DC

## 2012-01-02 MED ORDER — HYDROCODONE-HOMATROPINE 5-1.5 MG/5ML PO SYRP: 5.0000 mL | ORAL_SOLUTION | Freq: Two times a day (BID) | ORAL | Status: DC | PRN

## 2012-01-02 MED ORDER — AMOXICILLIN 875 MG PO TABS: 875.0000 mg | ORAL_TABLET | Freq: Two times a day (BID) | ORAL | Status: DC

## 2012-01-02 MED ORDER — BENZONATATE 100 MG PO CAPS
100.0000 mg | ORAL_CAPSULE | Freq: Three times a day (TID) | ORAL | Status: DC | PRN
Start: 2012-01-02 — End: 2012-01-05

## 2012-01-02 NOTE — Assessment & Plan Note (Addendum)
Likely viral, discussed. See pt instructions for treatment plan. Fill abx if not improving or any worsening. Checked with pharmacy and hycodan not ok for diabetics.  Will send in tessalon perls instead.

## 2012-01-02 NOTE — Telephone Encounter (Signed)
I provided pt with script for hycodan cough syrup - but checked with pharmacy and this does have sugar in it.  I notified pt that I've sent in instead tessalon perls for pt to try. Pt declines cheratussin refill as not really helping.

## 2012-01-02 NOTE — Progress Notes (Signed)
  Subjective:    Patient ID: Kirk Coma., male    DOB: [birth year], 61 y.o.   MRN: [id number]  HPI CC: URTI  Not feeling well.  Coughing productive of yellow mucous, RN, stabbing pain in chest with cough, ST.  + head and chest congestion, worse in chest.  This all started 5d ago.  So far has tried cheratussin but not helping.  Also tried honey with tea and sitting up at night.  Trouble sleeping flat 2/2 cough.  No fevers/chills, abd pain, n/v, ear or tooth pain, headaches.  No conjunctivitis.  No sick contacts.  No smokers at home.  No h/o COPD.  + asthma as child.  DM - sugars "doing ok".  Hasn't checked lately.  Last checked a few weeks ago.  Prior elevation of A1c to 10.5 attributes to drinking welch's grape juice.  Coming next month to recheck A1c.  No low sugars.  Did not tolerate higher dose of metformin so only taking 500mg  bid.  Medications and allergies reviewed and updated in chart.  Review of Systems Per HPI    Objective:   Physical Exam  Nursing note and vitals reviewed. Constitutional: He appears well-developed and well-nourished. No distress.       evidently congested and coughing  HENT:  Head: Normocephalic and atraumatic.  Right Ear: Tympanic membrane, external ear and ear canal normal.  Left Ear: Tympanic membrane, external ear and ear canal normal.  Nose: Nose normal. No mucosal edema or rhinorrhea. Right sinus exhibits no maxillary sinus tenderness and no frontal sinus tenderness. Left sinus exhibits no maxillary sinus tenderness and no frontal sinus tenderness.  Mouth/Throat: Oropharynx is clear and moist. No oropharyngeal exudate.  Eyes: Conjunctivae and EOM are normal. Pupils are equal, round, and reactive to light. No scleral icterus.  Neck: Normal range of motion. Neck supple.  Cardiovascular: Normal rate, regular rhythm, normal heart sounds and intact distal pulses.   No murmur heard.      Split S2  Pulmonary/Chest: Effort normal and breath sounds  normal. No respiratory distress. He has no wheezes. He has no rales.  Musculoskeletal: He exhibits no edema.  Lymphadenopathy:    He has no cervical adenopathy.  Skin: Skin is warm and dry. No rash noted.       Assessment & Plan:

## 2012-01-02 NOTE — Assessment & Plan Note (Signed)
Recheck next month. Deteriorated, pt attributes to welch's grape juice. Not checking sugars currently.  Discussed needs to monitor closely esp if ill. Only tolerating metformin 500mg  bid and amaryl 2mg  daily.

## 2012-01-02 NOTE — Patient Instructions (Addendum)
I think you do have bronchitis developing.  This is likely viral. Push fluids and rest.  Use simple mucinex or guaifenesin with plenty of fluid to mobilize mucous. Use hycodan cough syrup for cough at bedtime. If worsening or not improving as expected, fill antibiotic (amoxicillin for 10 days). Good to see you today, call us with questions. Return next month to recheck diabetes.  Bronchitis Bronchitis is the body's way of reacting to injury and/or infection (inflammation) of the bronchi. Bronchi are the air tubes that extend from the windpipe into the lungs. If the inflammation becomes severe, it may cause shortness of breath. CAUSES  Inflammation may be caused by:  A virus.   Germs (bacteria).   Dust.   Allergens.   Pollutants and many other irritants.  The cells lining the bronchial tree are covered with tiny hairs (cilia). These constantly beat upward, away from the lungs, toward the mouth. This keeps the lungs free of pollutants. When these cells become too irritated and are unable to do their job, mucus begins to develop. This causes the characteristic cough of bronchitis. The cough clears the lungs when the cilia are unable to do their job. Without either of these protective mechanisms, the mucus would settle in the lungs. Then you would develop pneumonia. Smoking is a common cause of bronchitis and can contribute to pneumonia. Stopping this habit is the single most important thing you can do to help yourself. TREATMENT   Your caregiver may prescribe an antibiotic if the cough is caused by bacteria. Also, medicines that open up your airways make it easier to breathe. Your caregiver may also recommend or prescribe an expectorant. It will loosen the mucus to be coughed up. Only take over-the-counter or prescription medicines for pain, discomfort, or fever as directed by your caregiver.   Removing whatever causes the problem (smoking, for example) is critical to preventing the problem  from getting worse.   Cough suppressants may be prescribed for relief of cough symptoms.   Inhaled medicines may be prescribed to help with symptoms now and to help prevent problems from returning.   For those with recurrent (chronic) bronchitis, there may be a need for steroid medicines.  SEEK IMMEDIATE MEDICAL CARE IF:   During treatment, you develop more pus-like mucus (purulent sputum).   You have a fever.   Your baby is older than 3 months with a rectal temperature of 102 F (38.9 C) or higher.   Your baby is 76 months old or younger with a rectal temperature of 100.4 F (38 C) or higher.   You become progressively more ill.   You have increased difficulty breathing, wheezing, or shortness of breath.  It is necessary to seek immediate medical care if you are elderly or sick from any other disease. MAKE SURE YOU:   Understand these instructions.   Will watch your condition.   Will get help right away if you are not doing well or get worse.  Document Released: 08/08/2005 Document Revised: 07/28/2011 Document Reviewed: 06/17/2008 [facility] Patient Information 2012 [place], [place].

## 2012-01-03 ENCOUNTER — Telehealth: Payer: Self-pay

## 2012-01-03 NOTE — Telephone Encounter (Signed)
Patient notified. He will call with any problems.

## 2012-01-03 NOTE — Telephone Encounter (Addendum)
Does he still have hycodan script? If so, may fill hycodan but to use sparingly and at night because does contain sugar and can make sleepy.  Should be ok to use temporarily.  To keep eye on sugars with this.

## 2012-01-03 NOTE — Telephone Encounter (Signed)
Pt seen 01/02/12; pt did not sleep last night due to productive cough with yellow phlegm. Tessalon did not help cough. Pt hurts on lt side of chest when coughs.Pt can be reached at [phone] and uses [facility].Please advise.

## 2012-01-04 ENCOUNTER — Telehealth: Payer: Self-pay | Admitting: Family Medicine

## 2012-01-04 NOTE — Telephone Encounter (Signed)
Noted. wil lsee tomorrow.  Had provided with amox scrip in case worsening.

## 2012-01-04 NOTE — Telephone Encounter (Signed)
Caller: Loel/Patient; PCP: Eustaquio Boyden; CB#: [phone]; ; ; Call regarding Shortness of Breath; Yellow sputum; pain over sinuses; Patient feels worse than when he was seen on 01/02/12. Emergent sx ruled out.  Home care for the interim and parameters for callback given.   Appt. w/ Dr. Sharen Hones on 01/05/12 @ 0815  per Diabetes:  Respiratory Problems protocol.

## 2012-01-05 ENCOUNTER — Other Ambulatory Visit: Payer: Self-pay | Admitting: Family Medicine

## 2012-01-05 ENCOUNTER — Ambulatory Visit (INDEPENDENT_AMBULATORY_CARE_PROVIDER_SITE_OTHER): Payer: No Typology Code available for payment source | Admitting: Family Medicine

## 2012-01-05 ENCOUNTER — Encounter: Payer: Self-pay | Admitting: Family Medicine

## 2012-01-05 VITALS — BP 110/74 | HR 80 | Temp 98.1°F | Wt 245.8 lb

## 2012-01-05 DIAGNOSIS — J40 Bronchitis, not specified as acute or chronic: Secondary | ICD-10-CM

## 2012-01-05 MED ORDER — AMOXICILLIN-POT CLAVULANATE 875-125 MG PO TABS: 1.0000 | ORAL_TABLET | Freq: Two times a day (BID) | ORAL | Status: AC

## 2012-01-05 NOTE — Assessment & Plan Note (Signed)
states zpacks don't work well for him. Given worsening and facial pressure, concern for sinusitis on top.  Change to augmentin. No evidence of other disease state currently.

## 2012-01-05 NOTE — Patient Instructions (Signed)
Could be developing sinus infection or may just need more time to resolve. Take augmentin instead of amoxicillin to cover sinusitis component Watch for fever >101.5 or worsening productive cough and let us know.

## 2012-01-05 NOTE — Progress Notes (Signed)
  Subjective:    Patient ID: Kirk Bennett., male    DOB: 06-14-1951, 61 y.o.   MRN: [id number]  HPI CC: not better  Seen here 01/02/2012 with dx bronchitis, treated with amoxicillin.  See prior note.  Not better.  Now with worsening sinus pain/pressure.  Coughing up more yellow phlegm.  Yesterday actually worse than today.  Wt Readings from Last 3 Encounters:  01/05/12 245 lb 12 oz (111.471 kg)  01/02/12 244 lb 12 oz (111.018 kg)  11/21/11 243 lb 4 oz (110.337 kg)     Review of Systems Per HPI    Objective:   Physical Exam  Nursing note and vitals reviewed. Constitutional: He appears well-developed and well-nourished. No distress.  HENT:  Head: Normocephalic and atraumatic.  Right Ear: Hearing, tympanic membrane, external ear and ear canal normal.  Left Ear: Hearing, tympanic membrane, external ear and ear canal normal.  Nose: Mucosal edema present. No rhinorrhea. Right sinus exhibits no maxillary sinus tenderness and no frontal sinus tenderness. Left sinus exhibits no maxillary sinus tenderness and no frontal sinus tenderness.  Mouth/Throat: Uvula is midline, oropharynx is clear and moist and mucous membranes are normal. No oropharyngeal exudate, posterior oropharyngeal edema, posterior oropharyngeal erythema or tonsillar abscesses.       L turbinate swelling  Eyes: Conjunctivae and EOM are normal. Pupils are equal, round, and reactive to light. No scleral icterus.  Neck: Normal range of motion. Neck supple.  Cardiovascular: Normal rate, regular rhythm, normal heart sounds and intact distal pulses.   No murmur heard. Pulmonary/Chest: Effort normal and breath sounds normal. No respiratory distress. He has no wheezes. He has no rales.  Lymphadenopathy:    He has no cervical adenopathy.  Skin: Skin is warm and dry. No rash noted.       Assessment & Plan:

## 2012-01-20 ENCOUNTER — Emergency Department: Payer: Self-pay | Admitting: Emergency Medicine

## 2012-01-20 LAB — CK TOTAL AND CKMB (NOT AT ARMC)
CK, Total: 65 U/L (ref 35–232)
CK-MB: 0.7 ng/mL (ref 0.5–3.6)

## 2012-01-20 LAB — BASIC METABOLIC PANEL
Anion Gap: 6 — ABNORMAL LOW (ref 7–16)
Calcium, Total: 9.2 mg/dL (ref 8.5–10.1)
Chloride: 102 mmol/L (ref 98–107)
Co2: 30 mmol/L (ref 21–32)
EGFR (Non-African Amer.): 51 — ABNORMAL LOW
Glucose: 214 mg/dL — ABNORMAL HIGH (ref 65–99)
Osmolality: 284 (ref 275–301)
Potassium: 3.7 mmol/L (ref 3.5–5.1)

## 2012-01-20 LAB — CBC
HGB: 12.5 g/dL — ABNORMAL LOW (ref 13.0–18.0)
MCHC: 32.5 g/dL (ref 32.0–36.0)
MCV: 86 fL (ref 80–100)
Platelet: 220 10*3/uL (ref 150–440)
RBC: 4.49 10*6/uL (ref 4.40–5.90)
RDW: 12.6 % (ref 11.5–14.5)
WBC: 5.6 10*3/uL (ref 3.8–10.6)

## 2012-01-20 LAB — TROPONIN I: Troponin-I: <0.02 ng/mL

## 2012-02-02 ENCOUNTER — Ambulatory Visit (INDEPENDENT_AMBULATORY_CARE_PROVIDER_SITE_OTHER): Payer: No Typology Code available for payment source | Admitting: Family Medicine

## 2012-02-02 ENCOUNTER — Ambulatory Visit: Payer: No Typology Code available for payment source | Admitting: Family Medicine

## 2012-02-02 ENCOUNTER — Encounter: Payer: Self-pay | Admitting: Family Medicine

## 2012-02-02 VITALS — BP 116/74 | HR 92 | Temp 98.0°F | Wt 239.8 lb

## 2012-02-02 DIAGNOSIS — R0789 Other chest pain: Secondary | ICD-10-CM | POA: Insufficient documentation

## 2012-02-02 DIAGNOSIS — E119 Type 2 diabetes mellitus without complications: Secondary | ICD-10-CM

## 2012-02-02 MED ORDER — NAPROXEN 500 MG PO TABS: ORAL_TABLET | ORAL | Status: AC

## 2012-02-02 NOTE — Progress Notes (Signed)
Subjective:    Patient ID: Kirk Coma., male    DOB: [birth year], 61 y.o.   MRN: [id number]  HPI CC: f/u DM, recent ER visit for chest pain  DM - sugars running 90s-150s, checks 2-3 times daily.  Back on diabetic diet and exercising regularly.  Walks 1-3 miles daily.  No paresthesias.  No low sugars or hypoglycemic sxs.  not due for A1c yet.  Wt Readings from Last 3 Encounters:  02/02/12 239 lb 12 oz (108.75 kg)  01/05/12 245 lb 12 oz (111.471 kg)  01/02/12 244 lb 12 oz (111.018 kg)    Recent ER visit to [facility] for chest pain - 01/20/2012.  Waited 3 hours, not seen, left.  Reviewed records available.  Normal EKG with NSR at rate 81 except for minimally delayed R wave progression but overall normal (asked to scan), CXR WNL.  Cardiac enzyme x1 normal.  5.6>12.5/38<220, glu 214, Cr 1.49, eGFR 49.  Chest pain described as throbbing ache throughout chest (not sharp, not pressure/tightness) associated with left arm pain down to hand.  Also with some neck pain.  Denies numbness or weakness of arm.  Was doing pushups prior which may have initiated chest pain.  Chest pain came on at rest.  No exhertional chest pain.  No associated nausea/diaphoresis.  Cardiac catheterization 02/2009 in [place], no blockages.  No strong fmhx CAD.  Nonsmoker.  Cholesterol levels great control.  Lab Results  Component Value Date   CHOL 105 03/12/2010   HDL 33* 11/21/2011   LDLCALC 57 11/16/2010   LDLDIRECT 49 11/21/2011   TRIG 71 11/21/2011   Past Medical History  Diagnosis Date  . Diabetes mellitus type II 1996  . HTN (hypertension)   . HLD (hyperlipidemia)   . Complex partial seizures 1990    R temporal arachnoid cyst s/p drainage, possible continued sz  . Knee pain     s/p replacement  . History of hepatitis A   . SVT (supraventricular tachycardia) 1996  . Cataracts, bilateral 02/2011    and suspected glaucoma, to return for f/u, no diabetic retinopathy  . Depression     found by neuro   Past Surgical History    Procedure Date  . Corrective surgery amblyopia 1957  . Left knee surgery 1971    Torn ACL  . Cystectomy or meningioma removal brain 1990    (unclear)  . 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 Tobie Poet)  . Cardiac catheterization July 2010    No blockages (Dr. Christene Slates)  . Colonoscopy 09/16/2005    hyperplastic polyps, rpt due 5-7 yrs   Family History  Problem Relation Age of Onset  . Heart failure Father   . Cancer Mother     Lung  . Aortic dissection Mother     Aortic rupture  . Mental illness Sister     59 years old  . Diabetes Maternal Aunt   . Diabetes Cousin   . Cancer Cousin     Colon age 89    Review of Systems Per HPI    Objective:   Physical Exam  Nursing note and vitals reviewed. Constitutional: He appears well-developed and well-nourished. No distress.  HENT:  Head: Normocephalic and atraumatic.  Mouth/Throat: Oropharynx is clear and moist. No oropharyngeal exudate.  Eyes: Conjunctivae and EOM are normal. Pupils are equal, round, and reactive to light. No scleral icterus.  Neck: Normal range of motion. Neck supple.  Cardiovascular: Normal rate, regular rhythm, normal heart  sounds and intact distal pulses.   No murmur heard. Pulmonary/Chest: Effort normal and breath sounds normal. No respiratory distress. He has no wheezes. He has no rales. He exhibits tenderness (left 2nd/3rd costochondral tenderness to palaption, reproduces prior chest pain).  Musculoskeletal: He exhibits no edema.  Skin: Skin is warm and dry. No rash noted.  Psychiatric: He has a normal mood and affect.       Assessment & Plan:

## 2012-02-02 NOTE — Assessment & Plan Note (Signed)
Chronic, prior uncontrolled. Had fallen off diet and exercise regimen with A1c up to 10.2%. Anticipate improved control as he endorses checking sugars regularly and in normal range. Will ask him to return in 1 month for lab visit for A1c, if normal, f/u in 4-6 mo. Pt agrees with plan.

## 2012-02-02 NOTE — Patient Instructions (Signed)
I think you have costochondritis- treat with short course of naprosyn twice daily (with food).  May also use ice/heat to area. If chest pain worsening or not improving, please let me know for referral to heart doctor. For diabetes - continue as up to now.  Return next month for blood work (lab visit) Return in 4-6 months for follow up. Good to see you today, call us with quesitons.

## 2012-02-02 NOTE — Assessment & Plan Note (Signed)
reproducible on palpation, non cardiac.  Treat as costochondritis with short course of NSAID (however use care given mild CRI). Recommended ensure hydration status as well as only taking short course. Ice/heat as well. If not improving, update me, low threshold to send to cards for stress test given h/o recently uncontrolled DM. No other significant risk factors other than obesity.

## 2012-02-11 ENCOUNTER — Other Ambulatory Visit: Payer: Self-pay | Admitting: Family Medicine

## 2012-02-17 ENCOUNTER — Other Ambulatory Visit: Payer: Self-pay | Admitting: Family Medicine

## 2012-03-12 DIAGNOSIS — Z79899 Other long term (current) drug therapy: Secondary | ICD-10-CM | POA: Insufficient documentation

## 2012-05-10 ENCOUNTER — Other Ambulatory Visit: Payer: Self-pay | Admitting: Family Medicine

## 2012-05-22 ENCOUNTER — Ambulatory Visit (INDEPENDENT_AMBULATORY_CARE_PROVIDER_SITE_OTHER): Payer: No Typology Code available for payment source | Admitting: Family Medicine

## 2012-05-22 ENCOUNTER — Encounter: Payer: Self-pay | Admitting: Family Medicine

## 2012-05-22 VITALS — BP 134/72 | HR 76 | Temp 98.1°F | Wt 256.5 lb

## 2012-05-22 DIAGNOSIS — I10 Essential (primary) hypertension: Secondary | ICD-10-CM

## 2012-05-22 DIAGNOSIS — Z23 Encounter for immunization: Secondary | ICD-10-CM

## 2012-05-22 DIAGNOSIS — E785 Hyperlipidemia, unspecified: Secondary | ICD-10-CM

## 2012-05-22 DIAGNOSIS — E119 Type 2 diabetes mellitus without complications: Secondary | ICD-10-CM

## 2012-05-22 NOTE — Assessment & Plan Note (Signed)
Chronic, stable. Continue combo med. BP Readings from Last 3 Encounters:  05/22/12 134/72  02/02/12 116/74  01/05/12 110/74

## 2012-05-22 NOTE — Patient Instructions (Addendum)
Blood work today. Pneumonia and flu shots today. Return in 4-5 months for follow up and physical. Call us with questions. Good to see you today.

## 2012-05-22 NOTE — Addendum Note (Signed)
Addended by: Josph Macho A on: 05/22/2012 08:41 AM   Modules accepted: Orders

## 2012-05-22 NOTE — Assessment & Plan Note (Signed)
Chronic, stable. Compliant with lipitor.  Check FLP and CMP as fasting today.

## 2012-05-22 NOTE — Progress Notes (Signed)
  Subjective:    Patient ID: Kirk Coma., male    DOB: [birth year], 61 y.o.   MRN: [id number]  HPI CC: DM f/u  DM - checks sugars once a day.  Not necessarily fasting.  Not watching diet.  Drinks water.  In mornings running low 100s.  No paresthesias.  No low sugars.  Continues to walk 1 mile daily.  HTN - No HA, vision changes, CP/tightness, SOB, leg swelling. Compliant with lisinopril hctz.  HDL - compliant with lipitor.  Wt Readings from Last 3 Encounters:  05/22/12 256 lb 8 oz (116.348 kg)  02/02/12 239 lb 12 oz (108.75 kg)  01/05/12 245 lb 12 oz (111.471 kg)  Weight up 13 lbs in last few months.  Continues to twalk.  Past Medical History  Diagnosis Date  . Diabetes mellitus type II 1996  . HTN (hypertension)   . HLD (hyperlipidemia)   . Complex partial seizures 1990    R temporal arachnoid cyst s/p drainage, possible continued sz  . Knee pain     s/p replacement  . History of hepatitis A   . SVT (supraventricular tachycardia) 1996  . Cataracts, bilateral 02/2011    and suspected glaucoma, to return for f/u, no diabetic retinopathy  . Depression     found by neuro     Review of Systems Per HPI    Objective:   Physical Exam  Nursing note and vitals reviewed. Constitutional: He appears well-developed and well-nourished. No distress.  HENT:  Head: Normocephalic and atraumatic.  Right Ear: External ear normal.  Left Ear: External ear normal.  Nose: Nose normal.  Mouth/Throat: Oropharynx is clear and moist. No oropharyngeal exudate.  Eyes: Conjunctivae normal and EOM are normal. Pupils are equal, round, and reactive to light. No scleral icterus.  Neck: Normal range of motion. Neck supple. Carotid bruit is not present.  Cardiovascular: Normal rate, regular rhythm, normal heart sounds and intact distal pulses.   No murmur heard.      Split S2  Pulmonary/Chest: Effort normal and breath sounds normal. No respiratory distress. He has no wheezes. He has no rales.    Musculoskeletal: He exhibits no edema.       Diabetic foot exam: Normal inspection No skin breakdown No calluses  Normal DP/PT pulses Normal sensation to light tough and monofilament Nails normal  Lymphadenopathy:    He has no cervical adenopathy.  Skin: Skin is warm and dry. No rash noted.  Psychiatric: He has a normal mood and affect.       Assessment & Plan:

## 2012-05-22 NOTE — Assessment & Plan Note (Signed)
Chronic, stable.  Check blood work today. Encouraged compliance with diabetic diet. Lab Results  Component Value Date   HGBA1C 6.4 05/18/2011  A1c 10.5% (11/2011). Continue meds.

## 2012-09-15 ENCOUNTER — Other Ambulatory Visit: Payer: Self-pay | Admitting: Family Medicine

## 2012-09-15 DIAGNOSIS — Z125 Encounter for screening for malignant neoplasm of prostate: Secondary | ICD-10-CM

## 2012-09-15 DIAGNOSIS — E1165 Type 2 diabetes mellitus with hyperglycemia: Secondary | ICD-10-CM

## 2012-09-24 ENCOUNTER — Other Ambulatory Visit (INDEPENDENT_AMBULATORY_CARE_PROVIDER_SITE_OTHER): Payer: No Typology Code available for payment source

## 2012-09-24 DIAGNOSIS — E1165 Type 2 diabetes mellitus with hyperglycemia: Secondary | ICD-10-CM

## 2012-09-24 DIAGNOSIS — Z125 Encounter for screening for malignant neoplasm of prostate: Secondary | ICD-10-CM

## 2012-09-27 ENCOUNTER — Encounter: Payer: Self-pay | Admitting: Family Medicine

## 2012-09-27 ENCOUNTER — Ambulatory Visit (INDEPENDENT_AMBULATORY_CARE_PROVIDER_SITE_OTHER): Payer: No Typology Code available for payment source | Admitting: Family Medicine

## 2012-09-27 VITALS — BP 102/68 | HR 84 | Temp 97.7°F | Wt 237.0 lb

## 2012-09-27 DIAGNOSIS — I10 Essential (primary) hypertension: Secondary | ICD-10-CM

## 2012-09-27 DIAGNOSIS — Z0000 Encounter for general adult medical examination without abnormal findings: Secondary | ICD-10-CM

## 2012-09-27 DIAGNOSIS — E119 Type 2 diabetes mellitus without complications: Secondary | ICD-10-CM

## 2012-09-27 DIAGNOSIS — E785 Hyperlipidemia, unspecified: Secondary | ICD-10-CM

## 2012-09-27 NOTE — Progress Notes (Signed)
Subjective:    Patient ID: Kirk Coma., male    DOB: June 08, 1951, 62 y.o.   MRN: [id number]  HPI CC: CPE  DM - last night sugar 77.  Running overall <100. HTN - doesn't check bp at home. HLD - no myalgias.  Compliant with lipitor.  Preventative: Tdap - 04/2011 Influenza shot - 05/2012 Pneumovax 2013 Had hepatitis A at age 87yo, hospitalized [facility] [place].  No hep B in past. Colonoscopy 2007, hyperplastic polyps x 2, due for rpt 5-7 years. No BM changes, no blood in stool.  rpt colonoscopy due 2017  DRE/PSA 1.7 04/2009, 2.0 06/2010, no nocturia.    Seat belt use discussed.  Wt Readings from Last 3 Encounters:  09/27/12 237 lb (107.502 kg)  05/22/12 256 lb 8 oz (116.348 kg)  02/02/12 239 lb 12 oz (108.75 kg)  Has cut sugar and carbs out of diet.  Drinking water.  Exercising more regularly - walks 3 miles a day.  Medications and allergies reviewed and updated in chart.  Past histories reviewed and updated if relevant as below. Patient Active Problem List  Diagnosis  . Diabetes type 2, uncontrolled  . HYPERLIPIDEMIA  . SEIZURE DISORDER, PARTIAL  . HYPERTENSION  . TOTAL KNEE REPLACEMENT, LEFT, HX OF  . Healthcare maintenance  . Chest discomfort   Past Medical History  Diagnosis Date  . Diabetes mellitus type II 1996  . HTN (hypertension)   . HLD (hyperlipidemia)   . Complex partial seizures 1990    R temporal arachnoid cyst s/p drainage, possible continued sz  . Knee pain     s/p replacement  . History of hepatitis A   . SVT (supraventricular tachycardia) 1996  . Cataracts, bilateral 02/2011    and suspected glaucoma, to return for f/u, no diabetic retinopathy  . Depression     found by neuro   Past Surgical History  Procedure Date  . Corrective surgery amblyopia 1957  . Left knee surgery 1971    Torn ACL  . Cystectomy or meningioma removal brain 1990    (unclear)  . Replacement total knee April 2011    Left [facility] Dr. Tobie Poet)  . Cardiac catheterization  July 2010    No blockages (Dr. Christene Slates)  . Colonoscopy 09/16/2005    hyperplastic polyps, rpt due 5-7 yrs   History  Substance Use Topics  . Smoking status: Never Smoker   . Smokeless tobacco: Not on file  . Alcohol Use: No   Family History  Problem Relation Age of Onset  . Heart failure Father   . Cancer Mother     Lung  . Aortic dissection Mother     Aortic rupture  . Mental illness Sister     101 years old  . Diabetes Maternal Aunt   . Diabetes Cousin   . Cancer Cousin     Colon age 34   No Known Allergies Current Outpatient Prescriptions on File Prior to Visit  Medication Sig Dispense Refill  . atorvastatin (LIPITOR) 40 MG tablet TAKE 1 TABLET BY MOUTH EVERY DAY  90 tablet  3  . glimepiride (AMARYL) 2 MG tablet TAKE 1 TABLET (2 MG TOTAL) BY MOUTH DAILY BEFORE BREAKFAST.  90 tablet  3  . levETIRAcetam (KEPPRA) 1000 MG tablet Take 1,000 mg by mouth 2 (two) times daily.       Marland Kitchen lisinopril-hydrochlorothiazide (PRINZIDE,ZESTORETIC) 10-12.5 MG per tablet TAKE 1 TABLET BY MOUTH DAILY.  90 tablet  3  . metFORMIN (GLUCOPHAGE) 1000 MG tablet  Take 1,000 mg by mouth 2 (two) times daily with a meal.      . ONE TOUCH ULTRA TEST test strip TEST BLOOD SUGAR FASTING IN THE MORNING AND 2 HOURS AFTER LUNCH OR DINNER  100 each  6  . naproxen (NAPROSYN) 500 MG tablet Take one po bid x 1 week then prn pain, take with food  30 tablet  0     Review of Systems  Constitutional: Positive for appetite change (appetite down). Negative for fever, chills, activity change, fatigue and unexpected weight change.  HENT: Negative for hearing loss and neck pain.   Eyes: Negative for visual disturbance.  Respiratory: Negative for cough, chest tightness, shortness of breath and wheezing.   Cardiovascular: Negative for chest pain, palpitations and leg swelling.  Gastrointestinal: Negative for nausea, vomiting, abdominal pain, diarrhea, constipation, blood in stool and abdominal distention.   Genitourinary: Negative for hematuria and difficulty urinating.  Musculoskeletal: Negative for myalgias and arthralgias.  Skin: Negative for rash.  Neurological: Positive for numbness (occasional R thigh paresthesias). Negative for dizziness, seizures, syncope, light-headedness and headaches.  Hematological: Does not bruise/bleed easily.  Psychiatric/Behavioral: Negative for dysphoric mood. The patient is not nervous/anxious.        Objective:   Physical Exam  Nursing note and vitals reviewed. Constitutional: He is oriented to person, place, and time. He appears well-developed and well-nourished. No distress.  HENT:  Head: Normocephalic and atraumatic.  Right Ear: Hearing, tympanic membrane, external ear and ear canal normal.  Left Ear: Hearing, tympanic membrane, external ear and ear canal normal.  Nose: Nose normal.  Mouth/Throat: Oropharynx is clear and moist. No oropharyngeal exudate.  Eyes: Conjunctivae normal and EOM are normal. Pupils are equal, round, and reactive to light. No scleral icterus.  Neck: Normal range of motion. Neck supple. No thyromegaly present.  Cardiovascular: Normal rate, regular rhythm, normal heart sounds and intact distal pulses.   No murmur heard. Pulses:      Radial pulses are 2+ on the right side, and 2+ on the left side.  Pulmonary/Chest: Effort normal and breath sounds normal. No respiratory distress. He has no wheezes. He has no rales.  Abdominal: Soft. Bowel sounds are normal. He exhibits no distension and no mass. There is no tenderness. There is no rebound and no guarding.  Genitourinary: Rectum normal and prostate normal. Rectal exam shows no external hemorrhoid, no internal hemorrhoid, no fissure, no mass, no tenderness and anal tone normal. Prostate is not enlarged (15gm) and not tender.  Musculoskeletal: Normal range of motion. He exhibits no edema.       Diabetic foot exam: Normal inspection No skin breakdown No calluses  Normal DP/PT  pulses Normal sensation to light tough and monofilament Nails normal   Lymphadenopathy:    He has no cervical adenopathy.  Neurological: He is alert and oriented to person, place, and time.       CN grossly intact, station and gait intact  Skin: Skin is warm and dry. No rash noted.  Psychiatric: He has a normal mood and affect. His behavior is normal. Judgment and thought content normal.      Assessment & Plan:

## 2012-09-27 NOTE — Assessment & Plan Note (Signed)
Stable on lipitor - continue.

## 2012-09-27 NOTE — Patient Instructions (Addendum)
Keep an eye on blood pressure - if consistently <110/70 let me know to change lisinopril hctz to only lisinopril. Good job with weight and blood pressure. Pass by Marion's office for referral to endocrinologist.

## 2012-09-27 NOTE — Assessment & Plan Note (Signed)
Recent A1c 6.6%, increase from 6.2% Pt became very frustrated that A1c increased despite dedicated lifestyle changes including cutting out carbs and sugars, daily exercise and 19lb weight loss. I tried to emphasize not to focus solely on lab values. Reviewing A1c, in last 2 years ranging from 5.5% (while on avandia) to 10.5%. Requests referral to endocrinologist, which I have done.

## 2012-09-27 NOTE — Assessment & Plan Note (Signed)
Chronic, stable.  Great control today Discussed monitoring at home and if consistently running low 100s, to notify me for med titration.

## 2012-09-27 NOTE — Assessment & Plan Note (Signed)
Preventative protocols reviewed and updated unless pt declined. Discussed healthy diet and lifestyle.  DRE/PSA reassuring. iFOB 04/2011 WNL.  Colonoscopy 2007, rpt due 2017.

## 2012-09-28 ENCOUNTER — Encounter: Payer: Self-pay | Admitting: Family Medicine

## 2012-09-28 ENCOUNTER — Telehealth: Payer: Self-pay

## 2012-09-28 NOTE — Telephone Encounter (Signed)
Pt request kidney function lab results when available.

## 2012-10-01 MED ORDER — LISINOPRIL 10 MG PO TABS: 10.0000 mg | ORAL_TABLET | Freq: Every day | ORAL | Status: DC

## 2012-10-01 NOTE — Telephone Encounter (Signed)
Spoke with Kirk Bennett and discussed Cr results.  See result report. Also he states blood pressure has been staying at 115-120 systolic. Desires to try lisinopril 10mg  alone - will monitor bp on new med.

## 2012-11-14 ENCOUNTER — Encounter: Payer: Self-pay | Admitting: Family Medicine

## 2013-01-31 ENCOUNTER — Other Ambulatory Visit: Payer: Self-pay | Admitting: Family Medicine

## 2013-02-06 LAB — BASIC METABOLIC PANEL: Creat: 1.4

## 2013-02-10 ENCOUNTER — Other Ambulatory Visit: Payer: Self-pay | Admitting: Family Medicine

## 2013-02-11 ENCOUNTER — Other Ambulatory Visit: Payer: Self-pay | Admitting: Family Medicine

## 2013-02-13 LAB — HM DIABETES FOOT EXAM

## 2013-03-13 ENCOUNTER — Encounter: Payer: Self-pay | Admitting: Family Medicine

## 2013-03-25 ENCOUNTER — Encounter: Payer: Self-pay | Admitting: Family Medicine

## 2013-03-27 ENCOUNTER — Ambulatory Visit (INDEPENDENT_AMBULATORY_CARE_PROVIDER_SITE_OTHER): Payer: No Typology Code available for payment source | Admitting: Family Medicine

## 2013-03-27 ENCOUNTER — Encounter: Payer: Self-pay | Admitting: Family Medicine

## 2013-03-27 VITALS — BP 134/82 | HR 80 | Temp 98.3°F | Wt 255.5 lb

## 2013-03-27 DIAGNOSIS — G40109 Localization-related (focal) (partial) symptomatic epilepsy and epileptic syndromes with simple partial seizures, not intractable, without status epilepticus: Secondary | ICD-10-CM

## 2013-03-27 DIAGNOSIS — E119 Type 2 diabetes mellitus without complications: Secondary | ICD-10-CM

## 2013-03-27 DIAGNOSIS — I10 Essential (primary) hypertension: Secondary | ICD-10-CM

## 2013-03-27 NOTE — Assessment & Plan Note (Signed)
Chronic, stable. Continue lisinopril 10mg daily. 

## 2013-03-27 NOTE — Assessment & Plan Note (Signed)
Improved A1c so amaryl has been D/C per endo. I and patient appreciate excellent care provided by Dr. Tedd Sias.

## 2013-03-27 NOTE — Assessment & Plan Note (Signed)
keppra increased recently 2/2 concern for breakthrough seizures.  Continue to f/u with neuro.

## 2013-03-27 NOTE — Patient Instructions (Signed)
Good to see you today. Call us with questions. Great job with diabetes and sugar control! Return in 6 months for physical, sooner if needed.

## 2013-03-27 NOTE — Progress Notes (Signed)
  Subjective:    Patient ID: Kirk Coma., male    DOB: 12-Aug-1951, 62 y.o.   MRN: [id number]  HPI CC: f/u DM  Seizures - recently saw neurologist Dr. Keene Breath - concern for persistent partial complex despite keppra.  Pt endorses minimal sxs.  On keppra 1000mg  in am and 1500mg  in afternoon.  Has been told should not drive.  DM - sugars are much better controlled since starting to see Dr. Tedd Sias at [facility].  Off glimeperide now, notes no low sugars now.  Foot exam last visit with endo.  Has f/u appt next month.  HTN - well controlled on lisinopril 10mg  daily. HLD - tolerating atorvastatin 40mg  daily.  CKD - actually, last check GFR >60 (per blood work by Dr. Tedd Sias 01/2013).  Pt happy with this.  Wt Readings from Last 3 Encounters:  03/27/13 255 lb 8 oz (115.894 kg)  09/27/12 237 lb (107.502 kg)  05/22/12 256 lb 8 oz (116.348 kg)   Past Medical History  Diagnosis Date  . Diabetes mellitus type II 1996    established with Dr. Tedd Sias, endo  . HTN (hypertension)   . HLD (hyperlipidemia)   . Complex partial seizures 1990    R temporal arachnoid cyst s/p drainage, possible continued sz (Dr. Keene Breath)  . Knee pain     s/p replacement  . History of hepatitis A   . SVT (supraventricular tachycardia) 1996  . Cataracts, bilateral 02/2011    and suspected glaucoma, to return for f/u, no diabetic retinopathy  . Depression     found by neuro     Review of Systems Per HPI    Objective:   Physical Exam  Nursing note and vitals reviewed. Constitutional: He appears well-developed and well-nourished. No distress.  HENT:  Head: Normocephalic and atraumatic.  Mouth/Throat: Oropharynx is clear and moist. No oropharyngeal exudate.  Eyes: Conjunctivae and EOM are normal. Pupils are equal, round, and reactive to light.  Neck: Normal range of motion. Neck supple. Carotid bruit is not present.  Cardiovascular: Normal rate, regular rhythm, normal heart sounds and intact distal pulses.   No murmur  heard. Pulmonary/Chest: Effort normal and breath sounds normal. No respiratory distress. He has no wheezes. He has no rales.  Musculoskeletal: He exhibits no edema.  Lymphadenopathy:    He has no cervical adenopathy.  Psychiatric: He has a normal mood and affect.       Assessment & Plan:

## 2013-05-01 ENCOUNTER — Ambulatory Visit (INDEPENDENT_AMBULATORY_CARE_PROVIDER_SITE_OTHER): Payer: No Typology Code available for payment source

## 2013-05-01 ENCOUNTER — Ambulatory Visit: Payer: No Typology Code available for payment source

## 2013-05-01 DIAGNOSIS — Z23 Encounter for immunization: Secondary | ICD-10-CM

## 2013-05-02 ENCOUNTER — Other Ambulatory Visit: Payer: Self-pay | Admitting: Family Medicine

## 2013-05-02 MED ORDER — LISINOPRIL 10 MG PO TABS: 10.0000 mg | ORAL_TABLET | Freq: Every day | ORAL | Status: DC

## 2013-08-05 ENCOUNTER — Ambulatory Visit: Payer: No Typology Code available for payment source | Admitting: Family Medicine

## 2013-08-08 ENCOUNTER — Ambulatory Visit (INDEPENDENT_AMBULATORY_CARE_PROVIDER_SITE_OTHER): Payer: No Typology Code available for payment source | Admitting: Family Medicine

## 2013-08-08 DIAGNOSIS — I10 Essential (primary) hypertension: Secondary | ICD-10-CM

## 2013-08-08 DIAGNOSIS — Z0289 Encounter for other administrative examinations: Secondary | ICD-10-CM

## 2013-08-08 NOTE — Progress Notes (Deleted)
Pre-visit discussion using our clinic review tool. No additional management support is needed unless otherwise documented below in the visit note.  

## 2013-08-09 NOTE — Progress Notes (Signed)
DNKA

## 2013-08-22 DIAGNOSIS — E1122 Type 2 diabetes mellitus with diabetic chronic kidney disease: Secondary | ICD-10-CM

## 2013-08-22 DIAGNOSIS — H409 Unspecified glaucoma: Secondary | ICD-10-CM

## 2013-08-22 DIAGNOSIS — N183 Chronic kidney disease, stage 3 unspecified: Secondary | ICD-10-CM

## 2013-08-22 HISTORY — DX: Chronic kidney disease, stage 3 unspecified: N18.30

## 2013-08-22 HISTORY — DX: Unspecified glaucoma: H40.9

## 2013-08-22 HISTORY — DX: Type 2 diabetes mellitus with diabetic chronic kidney disease: E11.22

## 2013-08-27 ENCOUNTER — Encounter: Payer: Self-pay | Admitting: Internal Medicine

## 2013-08-27 ENCOUNTER — Ambulatory Visit (INDEPENDENT_AMBULATORY_CARE_PROVIDER_SITE_OTHER): Payer: No Typology Code available for payment source | Admitting: Internal Medicine

## 2013-08-27 VITALS — BP 138/84 | HR 80 | Temp 98.2°F | Wt 262.5 lb

## 2013-08-27 DIAGNOSIS — B9789 Other viral agents as the cause of diseases classified elsewhere: Principal | ICD-10-CM

## 2013-08-27 DIAGNOSIS — J069 Acute upper respiratory infection, unspecified: Secondary | ICD-10-CM

## 2013-08-27 NOTE — Progress Notes (Signed)
HPI  Pt presents to the clinic today with c/o cough and sore throat. This started about 3 days ago. He also c/o runny nose, scratchy throat. He has taken Tylenol Cold and Flu. He denies fever, chills or body aches. He has no history of allergies or breathing problems. He has not had sick contacts that he is aware of.  Review of Systems      Past Medical History  Diagnosis Date  . Diabetes mellitus type II 1996    established with Dr. Gabriel Carina, endo  . HTN (hypertension)   . HLD (hyperlipidemia)   . Complex partial seizures 1990    R temporal arachnoid cyst s/p drainage, possible continued sz (Dr. Mora Bellman)  . Knee pain     s/p replacement  . History of hepatitis A   . SVT (supraventricular tachycardia) 1996  . Cataracts, bilateral 02/2011    and suspected glaucoma, to return for f/u, no diabetic retinopathy  . Depression     found by neuro    Family History  Problem Relation Age of Onset  . Heart failure Father   . Cancer Mother     Lung, smoker  . Aortic dissection Mother     Aortic rupture  . Mental illness Sister     58 years old  . Diabetes Maternal Aunt   . Diabetes Cousin   . Cancer Cousin     Colon age 96  . CAD Neg Hx     History   Social History  . Marital Status: Married    Spouse Name: N/A    Number of Children: N/A  . Years of Education: N/A   Occupational History  . Not on file.   Social History Main Topics  . Smoking status: Never Smoker   . Smokeless tobacco: Never Used  . Alcohol Use: No  . Drug Use: No  . Sexual Activity: Not on file   Other Topics Concern  . Not on file   Social History Narrative  . No narrative on file    No Known Allergies   Constitutional: Positive headache, fatigue and fever. Denies abrupt weight changes.  HEENT:  Positive sore throat. Denies eye redness, eye pain, pressure behind the eyes, facial pain, nasal congestion, ear pain, ringing in the ears, wax buildup, runny nose or bloody nose. Respiratory: Positive  cough. Denies difficulty breathing or shortness of breath.  Cardiovascular: Denies chest pain, chest tightness, palpitations or swelling in the hands or feet.   No other specific complaints in a complete review of systems (except as listed in HPI above).  Objective:   BP 138/84  Pulse 80  Temp(Src) 98.2 F (36.8 C) (Oral)  Wt 262 lb 8 oz (119.069 kg)  SpO2 98% Wt Readings from Last 3 Encounters:  08/27/13 262 lb 8 oz (119.069 kg)  03/27/13 255 lb 8 oz (115.894 kg)  09/27/12 237 lb (107.502 kg)     General: Appears his stated age, well developed, well nourished in NAD. HEENT: Head: normal shape and size; Eyes: sclera white, no icterus, conjunctiva pink, PERRLA and EOMs intact; Ears: Tm's gray and intact, normal light reflex; Nose: mucosa pink and moist, septum midline; Throat/Mouth: + PND. Teeth present, mucosa erythematous and moist, no exudate noted, no lesions or ulcerations noted.  Neck: Mild cervical lymphadenopathy. Neck supple, trachea midline. No massses, lumps or thyromegaly present.  Cardiovascular: Normal rate and rhythm. S1,S2 noted.  No murmur, rubs or gallops noted. No JVD or BLE edema. No carotid bruits noted.  Pulmonary/Chest: Normal effort and positive vesicular breath sounds. No respiratory distress. No wheezes, rales or ronchi noted.      Assessment & Plan:   Upper Respiratory Infection, likely viral  Get some rest and drink plenty of water Do salt water gargles for the sore throat Continue Tylenol Cold and Flu  RTC as needed or if symptoms persist.

## 2013-08-27 NOTE — Patient Instructions (Signed)

## 2013-08-27 NOTE — Progress Notes (Signed)
Pre-visit discussion using our clinic review tool. No additional management support is needed unless otherwise documented below in the visit note.  

## 2013-08-28 ENCOUNTER — Ambulatory Visit: Payer: No Typology Code available for payment source | Admitting: Internal Medicine

## 2013-09-01 ENCOUNTER — Encounter: Payer: Self-pay | Admitting: Family Medicine

## 2013-09-16 ENCOUNTER — Other Ambulatory Visit: Payer: Self-pay | Admitting: Family Medicine

## 2013-09-27 ENCOUNTER — Ambulatory Visit (INDEPENDENT_AMBULATORY_CARE_PROVIDER_SITE_OTHER): Payer: No Typology Code available for payment source | Admitting: Family Medicine

## 2013-09-27 ENCOUNTER — Encounter: Payer: Self-pay | Admitting: Family Medicine

## 2013-09-27 VITALS — BP 128/80 | HR 88 | Temp 97.6°F | Wt 261.5 lb

## 2013-09-27 DIAGNOSIS — E785 Hyperlipidemia, unspecified: Secondary | ICD-10-CM

## 2013-09-27 DIAGNOSIS — E119 Type 2 diabetes mellitus without complications: Secondary | ICD-10-CM

## 2013-09-27 DIAGNOSIS — I10 Essential (primary) hypertension: Secondary | ICD-10-CM

## 2013-09-27 DIAGNOSIS — G40109 Localization-related (focal) (partial) symptomatic epilepsy and epileptic syndromes with simple partial seizures, not intractable, without status epilepticus: Secondary | ICD-10-CM

## 2013-09-27 NOTE — Progress Notes (Signed)
   BP 128/80  Pulse 88  Temp(Src) 97.6 F (36.4 C) (Oral)  Wt 261 lb 8 oz (118.616 kg)   CC: f/u visit  Subjective:    Patient ID: Kirk Buttery., male    DOB: Sep 16, 1950, 63 y.o.   MRN: [id number]  HPI: Kirk Bennett. is a 63 y.o. male presenting on 09/27/2013 with Follow-up  Seizure free since keppra 1500mg  bid.  States had keppra level checked recently and was normal.  DM - followed by endo.  Last a1c was 6.2%.  Compliant with metfomrin 1000mg  bid.  HTN - Compliant with current antihypertensive regimen of lisinopril 10mg  bid.  Does check blood pressures at home: good control endorsed.  No low blood pressure readings or symptoms of dizziness/syncope.  Denies HA, vision changes, CP/tightness, SOB, leg swelling.    HLD - compliant with lipitor 40mg  daily without myalgias.  Relevant past medical, surgical, family and social history reviewed and updated. Allergies and medications reviewed and updated. Current Outpatient Prescriptions on File Prior to Visit  Medication Sig  . atorvastatin (LIPITOR) 40 MG tablet TAKE 1 TABLET BY MOUTH EVERY DAY  . lisinopril (PRINIVIL,ZESTRIL) 10 MG tablet Take 1 tablet (10 mg total) by mouth daily.  . metFORMIN (GLUCOPHAGE) 1000 MG tablet TAKE 1 TABLET BY MOUTH TWICE A DAY WITH A MEAL  . ONE TOUCH ULTRA TEST test strip TEST BLOOD SUGAR FASTING IN THE MORNING AND 2 HOURS AFTER LUNCH OR DINNER   No current facility-administered medications on file prior to visit.    Review of Systems Per HPI unless specifically indicated above    Objective:    BP 128/80  Pulse 88  Temp(Src) 97.6 F (36.4 C) (Oral)  Wt 261 lb 8 oz (118.616 kg)  Physical Exam  Nursing note and vitals reviewed. Constitutional: He appears well-developed and well-nourished. No distress.  HENT:  Head: Normocephalic and atraumatic.  Mouth/Throat: Oropharynx is clear and moist. No oropharyngeal exudate.  Eyes: Conjunctivae and EOM are normal. Pupils are equal, round, and  reactive to light. No scleral icterus.  Neck: Normal range of motion. Neck supple. Carotid bruit is not present.  Cardiovascular: Normal rate, regular rhythm, normal heart sounds and intact distal pulses.   No murmur heard. Pulmonary/Chest: Breath sounds normal. No respiratory distress. He has no wheezes. He has no rales.  Skin: Skin is warm and dry. No rash noted.   Results for orders placed in visit on 03/27/13  HM DIABETES FOOT EXAM      Result Value Range   HM Diabetic Foot Exam done        Assessment & Plan:   Problem List Items Addressed This Visit   HYPERLIPIDEMIA     Chronic, compliant with regimen. Due for recheck FLP - I have provided him with written script to see if he can have this drawn at upcoming lab visit with endo.    HYPERTENSION      Chronic, stable. Continue lisinopril 10mg  daily. BP Readings from Last 3 Encounters:  09/27/13 128/80  08/27/13 138/84  03/27/13 134/82      SEIZURE DISORDER, PARTIAL - Primary     Stable on keppra higher dose 1500mg  bid.  Pt states he recently had normal keppra levels so will not redraw today.        Follow up plan: Return in about 5 months (around 02/24/2014), or as needed, for physical.

## 2013-09-27 NOTE — Assessment & Plan Note (Signed)
Chronic, stable. Continue lisinopril 10mg  daily. BP Readings from Last 3 Encounters:  09/27/13 128/80  08/27/13 138/84  03/27/13 134/82

## 2013-09-27 NOTE — Patient Instructions (Signed)
I will give you prescription for cholesterol panel to see if it can be drawn at upcoming fasting blood work later this month. I think you're doing well. Return to see me in 4-6 months for physical. Continue meds as up to now.

## 2013-09-27 NOTE — Assessment & Plan Note (Signed)
Chronic, followed by endo.  Congratulated on great control and appreciate endo assistance (Solum)

## 2013-09-27 NOTE — Assessment & Plan Note (Signed)
Chronic, compliant with regimen. Due for recheck FLP - I have provided him with written script to see if he can have this drawn at upcoming lab visit with endo.

## 2013-09-27 NOTE — Assessment & Plan Note (Signed)
Stable on keppra higher dose 1500mg  bid.  Pt states he recently had normal keppra levels so will not redraw today.

## 2013-09-27 NOTE — Progress Notes (Signed)
Pre-visit discussion using our clinic review tool. No additional management support is needed unless otherwise documented below in the visit note.  

## 2013-09-30 ENCOUNTER — Telehealth: Payer: Self-pay | Admitting: Family Medicine

## 2013-09-30 NOTE — Telephone Encounter (Signed)
Relevant patient education assigned to patient using Emmi. ° °

## 2013-10-02 ENCOUNTER — Telehealth: Payer: Self-pay

## 2013-10-02 NOTE — Telephone Encounter (Signed)
Relevant patient education assigned to patient using Emmi. ° °

## 2013-10-21 ENCOUNTER — Telehealth: Payer: Self-pay | Admitting: *Deleted

## 2013-10-21 DIAGNOSIS — E785 Hyperlipidemia, unspecified: Secondary | ICD-10-CM

## 2013-10-21 DIAGNOSIS — E119 Type 2 diabetes mellitus without complications: Secondary | ICD-10-CM

## 2013-10-21 NOTE — Telephone Encounter (Signed)
Patient called and said [facility] clinic did not draw his A1c and he needs that done. He also said they were supposed to be faxing you the results of the labs that they did draw. I scheduled him a lab appt for next week incase there was anything else you wanted to have drawn other than the A1c. He will need orders placed. Thanks!

## 2013-10-28 NOTE — Telephone Encounter (Signed)
I never received records from [facility]. Have ordered labs.

## 2013-10-30 NOTE — Telephone Encounter (Signed)
Records requested

## 2013-10-31 ENCOUNTER — Other Ambulatory Visit (INDEPENDENT_AMBULATORY_CARE_PROVIDER_SITE_OTHER): Payer: No Typology Code available for payment source

## 2013-10-31 DIAGNOSIS — E119 Type 2 diabetes mellitus without complications: Secondary | ICD-10-CM

## 2013-10-31 DIAGNOSIS — E785 Hyperlipidemia, unspecified: Secondary | ICD-10-CM

## 2013-10-31 NOTE — Addendum Note (Signed)
Addended by: Ellamae Sia on: 10/31/2013 03:34 PM   Modules accepted: Orders

## 2013-11-01 ENCOUNTER — Other Ambulatory Visit: Payer: Self-pay | Admitting: Family Medicine

## 2014-01-21 ENCOUNTER — Telehealth: Payer: Self-pay | Admitting: Family Medicine

## 2014-01-21 DIAGNOSIS — E119 Type 2 diabetes mellitus without complications: Secondary | ICD-10-CM

## 2014-01-21 DIAGNOSIS — E785 Hyperlipidemia, unspecified: Secondary | ICD-10-CM

## 2014-01-21 NOTE — Telephone Encounter (Signed)
Spoke with patient who said that endo wouldn't order labs through [facility] and he couldn't afford them to go to any other lab. He said that endo was faxing the labs that they wanted here and he was asking if Dr. Darnell Level would order them. I advised that we would need to see what labs were needed first and then I would schedule a lab appt for him. He verbalized understanding.

## 2014-01-21 NOTE — Telephone Encounter (Signed)
Already spoke with patient in regards to this. Have not received fax as of yet.

## 2014-01-21 NOTE — Telephone Encounter (Signed)
Patient is asking for you to call him back about lab work.

## 2014-01-21 NOTE — Telephone Encounter (Deleted)
Pt called wanting to know if you received a lab order from Dr.

## 2014-01-21 NOTE — Telephone Encounter (Signed)
Pt called and wanted to know if you received a fax yesterday from Dr. Gabriel Carina. Please return his call. Thank you.

## 2014-01-24 ENCOUNTER — Telehealth: Payer: Self-pay | Admitting: Family Medicine

## 2014-01-24 NOTE — Telephone Encounter (Signed)
Received fax from Dr. Gabriel Carina requesting labs to be drawn here. They do not draw for [facility] and that is where patient needs labs sent due to cost. Okay for him to come here for labs? If so, please order them and order for [facility]. Fax in your IN box for review. Thanks!

## 2014-01-24 NOTE — Telephone Encounter (Signed)
Patient called again at 4:05 pm.  Patient said he's at home and you can call him at [phone].

## 2014-01-24 NOTE — Telephone Encounter (Signed)
Pt called office at 8:20am. Pt request call back at [phone]. Thanks

## 2014-01-24 NOTE — Telephone Encounter (Signed)
Ordered. plz have pt come in for labwork next week.

## 2014-01-24 NOTE — Telephone Encounter (Signed)
Spoke with patient.

## 2014-01-24 NOTE — Addendum Note (Signed)
Addended by: Ria Bush on: 01/24/2014 05:20 PM   Modules accepted: Orders

## 2014-01-27 ENCOUNTER — Other Ambulatory Visit (INDEPENDENT_AMBULATORY_CARE_PROVIDER_SITE_OTHER): Payer: No Typology Code available for payment source

## 2014-01-27 DIAGNOSIS — E119 Type 2 diabetes mellitus without complications: Secondary | ICD-10-CM

## 2014-01-27 DIAGNOSIS — E785 Hyperlipidemia, unspecified: Secondary | ICD-10-CM

## 2014-01-27 NOTE — Telephone Encounter (Signed)
Lab appt scheduled.

## 2014-02-05 ENCOUNTER — Other Ambulatory Visit: Payer: Self-pay | Admitting: Family Medicine

## 2014-02-19 ENCOUNTER — Other Ambulatory Visit: Payer: Self-pay | Admitting: Family Medicine

## 2014-02-19 NOTE — Telephone Encounter (Signed)
Spoke with patient. He said he restarted medication ~6 months ago. Med list updated and Rx refilled.

## 2014-02-19 NOTE — Telephone Encounter (Signed)
received refill request for glimepiride but I don't think pt is on this med anymore. plz verify with patient.

## 2014-03-22 LAB — HM DIABETES EYE EXAM

## 2014-04-23 ENCOUNTER — Encounter: Payer: Self-pay | Admitting: Family Medicine

## 2014-04-23 ENCOUNTER — Ambulatory Visit (INDEPENDENT_AMBULATORY_CARE_PROVIDER_SITE_OTHER): Payer: No Typology Code available for payment source | Admitting: Family Medicine

## 2014-04-23 VITALS — BP 122/78 | HR 80 | Temp 97.6°F | Wt 250.8 lb

## 2014-04-23 DIAGNOSIS — E119 Type 2 diabetes mellitus without complications: Secondary | ICD-10-CM

## 2014-04-23 DIAGNOSIS — G40109 Localization-related (focal) (partial) symptomatic epilepsy and epileptic syndromes with simple partial seizures, not intractable, without status epilepticus: Secondary | ICD-10-CM

## 2014-04-23 DIAGNOSIS — I10 Essential (primary) hypertension: Secondary | ICD-10-CM

## 2014-04-23 DIAGNOSIS — R339 Retention of urine, unspecified: Secondary | ICD-10-CM

## 2014-04-23 DIAGNOSIS — E785 Hyperlipidemia, unspecified: Secondary | ICD-10-CM

## 2014-04-23 DIAGNOSIS — Z23 Encounter for immunization: Secondary | ICD-10-CM

## 2014-04-23 LAB — POCT URINALYSIS DIPSTICK
BILIRUBIN UA: NEGATIVE
Blood, UA: NEGATIVE
Ketones, UA: NEGATIVE
Leukocytes, UA: NEGATIVE
Nitrite, UA: NEGATIVE
PROTEIN UA: NEGATIVE
SPEC GRAV UA: 1.02
Urobilinogen, UA: 0.2
pH, UA: 5.5

## 2014-04-23 MED ORDER — ATORVASTATIN CALCIUM 20 MG PO TABS: ORAL_TABLET | ORAL | Status: DC

## 2014-04-23 MED ORDER — LISINOPRIL 10 MG PO TABS: 10.0000 mg | ORAL_TABLET | Freq: Every day | ORAL | Status: DC

## 2014-04-23 NOTE — Addendum Note (Signed)
Addended by: Ria Bush on: 04/23/2014 10:05 AM   Modules accepted: Orders

## 2014-04-23 NOTE — Assessment & Plan Note (Signed)
Check UA to r/o infection as cause. 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 No h/o BPH, on latest check prostate WNL. Will schedule physical as due - will need prostate check next visit. If workup WNL, would consider med for this.

## 2014-04-23 NOTE — Addendum Note (Signed)
Addended by: Marchia Bond on: 04/23/2014 10:18 AM   Modules accepted: Orders

## 2014-04-23 NOTE — Assessment & Plan Note (Signed)
Followed by endo, appreciate care. Noted addition of farxiga. Continue regimen. Check labwork today and will fax to endo.

## 2014-04-23 NOTE — Progress Notes (Signed)
BP 122/78  Pulse 80  Temp(Src) 97.6 F (36.4 C) (Oral)  Wt 250 lb 12 oz (113.739 kg)   CC: 6 mo f/u  Subjective:    Patient ID: Kirk Bennett., male    DOB: Nov 05, 1950, 63 y.o.   MRN: [id number]  HPI: Kirk Bennett. is a 63 y.o. male presenting on 04/23/2014 for Follow-up   DM followed by endo. Compliant with amaryl 2mg  daily and metformin 1000mg  bid. Recently started on farxiga 5mg  daily. Last A1c 7.4% (01/2014). Eye exam last month - monitoring closely for glaucoma. Sees Q6 mo. Sees Dr. Tobe Sos H/o partial seizures - seizure free on keppra 1000mg  bid. HTN - compliant and stable on lisinopril 10mg  daily. Some urinary incontinence worse in am and better during the day. No significant nocturia. No dysuria or frequency. Some urinary urgency with incomplete emptying.  On last visit, neurologist was concerned about depression. Pt denies this and declines treatment today.  Flu shot today.  Relevant past medical, surgical, family and social history reviewed and updated as indicated.  Allergies and medications reviewed and updated. Current Outpatient Prescriptions on File Prior to Visit  Medication Sig  . glimepiride (AMARYL) 2 MG tablet TAKE 1 TABLET (2 MG TOTAL) BY MOUTH DAILY BEFORE BREAKFAST.  . metFORMIN (GLUCOPHAGE) 1000 MG tablet TAKE 1 TABLET BY MOUTH TWICE A DAY WITH A MEAL  . ONE TOUCH ULTRA TEST test strip TEST BLOOD SUGAR FASTING IN THE MORNING AND 2 HOURS AFTER LUNCH OR DINNER   No current facility-administered medications on file prior to visit.    Review of Systems Per HPI unless specifically indicated above    Objective:    BP 122/78  Pulse 80  Temp(Src) 97.6 F (36.4 C) (Oral)  Wt 250 lb 12 oz (113.739 kg)  Physical Exam  Nursing note and vitals reviewed. Constitutional: He appears well-developed and well-nourished. No distress.  HENT:  Mouth/Throat: Oropharynx is clear and moist. No oropharyngeal exudate.  Eyes: Conjunctivae and EOM are normal.  Pupils are equal, round, and reactive to light. No scleral icterus.  Cardiovascular: Normal rate, regular rhythm, normal heart sounds and intact distal pulses.   No murmur heard. Pulmonary/Chest: Effort normal and breath sounds normal. No respiratory distress. He has no wheezes. He has no rales.  Musculoskeletal: He exhibits no edema.  Psychiatric: He has a normal mood and affect.   Results for orders placed in visit on 04/23/14  HM DIABETES EYE EXAM      Result Value Ref Range   HM Diabetic Eye Exam No Retinopathy  No Retinopathy      Assessment & Plan:   Problem List Items Addressed This Visit   SEIZURE DISORDER, PARTIAL     Stable on keppra 2000mg  bid. followed by [facility]    Incomplete emptying of bladder     Check UA to r/o infection as cause. 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 No h/o BPH, on latest check prostate WNL. Will schedule physical as due - will need prostate check next visit. If workup WNL, would consider med for this.    HYPERTENSION     Chronic, stable. Continue lisinopril 10mg  daily.    Relevant Medications      atorvastatin (LIPITOR) tablet      lisinopril (PRINIVIL,ZESTRIL) tablet   HYPERLIPIDEMIA     Latest LDL 50s, discussed this. Will decrease lipitor to 20mg  daily - new dose sent to pharmacy.    Relevant Medications      atorvastatin (  LIPITOR) tablet      lisinopril (PRINIVIL,ZESTRIL) tablet   Diabetes type 2, controlled - Primary     Followed by endo, appreciate care. Noted addition of farxiga. Continue regimen. Check labwork today and will fax to endo.    Relevant Medications      Dapagliflozin Propanediol (FARXIGA) 5 MG TABS      atorvastatin (LIPITOR) tablet      lisinopril (PRINIVIL,ZESTRIL) tablet       Follow up plan: Return in about 4 months (around 08/23/2014), or as needed, for annual exam, prior fasting for blood work.

## 2014-04-23 NOTE — Assessment & Plan Note (Addendum)
Stable on keppra 2000mg  bid. followed by [facility]

## 2014-04-23 NOTE — Progress Notes (Signed)
Pre visit review using our clinic review tool, if applicable. No additional management support is needed unless otherwise documented below in the visit note. 

## 2014-04-23 NOTE — Patient Instructions (Addendum)
Flu shot today. Blood work from [facility] today. Return in 4-6 months for physical. Urine checked today. Good to see you today, call us with quesitons

## 2014-04-23 NOTE — Addendum Note (Signed)
Addended by: Royann Shivers A on: 04/23/2014 10:17 AM   Modules accepted: Orders

## 2014-04-23 NOTE — Assessment & Plan Note (Signed)
Latest LDL 50s, discussed this. Will decrease lipitor to 20mg  daily - new dose sent to pharmacy.

## 2014-04-23 NOTE — Assessment & Plan Note (Signed)
Chronic, stable. Continue lisinopril 10mg daily. 

## 2014-04-26 ENCOUNTER — Encounter: Payer: Self-pay | Admitting: Family Medicine

## 2014-04-26 DIAGNOSIS — N183 Chronic kidney disease, stage 3 (moderate): Secondary | ICD-10-CM

## 2014-04-26 DIAGNOSIS — E1122 Type 2 diabetes mellitus with diabetic chronic kidney disease: Secondary | ICD-10-CM | POA: Insufficient documentation

## 2014-05-02 ENCOUNTER — Other Ambulatory Visit: Payer: Self-pay | Admitting: Family Medicine

## 2014-08-01 ENCOUNTER — Other Ambulatory Visit: Payer: Self-pay | Admitting: Family Medicine

## 2014-08-08 ENCOUNTER — Other Ambulatory Visit: Payer: Self-pay | Admitting: Family Medicine

## 2014-08-17 ENCOUNTER — Other Ambulatory Visit: Payer: Self-pay | Admitting: Family Medicine

## 2014-08-17 DIAGNOSIS — I10 Essential (primary) hypertension: Secondary | ICD-10-CM

## 2014-08-17 DIAGNOSIS — E785 Hyperlipidemia, unspecified: Secondary | ICD-10-CM

## 2014-08-17 DIAGNOSIS — N183 Chronic kidney disease, stage 3 (moderate): Secondary | ICD-10-CM

## 2014-08-17 DIAGNOSIS — E119 Type 2 diabetes mellitus without complications: Secondary | ICD-10-CM

## 2014-08-17 DIAGNOSIS — Z125 Encounter for screening for malignant neoplasm of prostate: Secondary | ICD-10-CM

## 2014-08-17 DIAGNOSIS — E1122 Type 2 diabetes mellitus with diabetic chronic kidney disease: Secondary | ICD-10-CM

## 2014-08-19 ENCOUNTER — Other Ambulatory Visit (INDEPENDENT_AMBULATORY_CARE_PROVIDER_SITE_OTHER): Payer: No Typology Code available for payment source

## 2014-08-19 DIAGNOSIS — E785 Hyperlipidemia, unspecified: Secondary | ICD-10-CM

## 2014-08-19 DIAGNOSIS — E1122 Type 2 diabetes mellitus with diabetic chronic kidney disease: Secondary | ICD-10-CM

## 2014-08-19 DIAGNOSIS — E119 Type 2 diabetes mellitus without complications: Secondary | ICD-10-CM

## 2014-08-19 DIAGNOSIS — Z125 Encounter for screening for malignant neoplasm of prostate: Secondary | ICD-10-CM

## 2014-08-19 DIAGNOSIS — N183 Chronic kidney disease, stage 3 (moderate): Secondary | ICD-10-CM

## 2014-08-26 ENCOUNTER — Encounter: Payer: No Typology Code available for payment source | Admitting: Family Medicine

## 2014-09-02 ENCOUNTER — Ambulatory Visit (INDEPENDENT_AMBULATORY_CARE_PROVIDER_SITE_OTHER): Payer: No Typology Code available for payment source | Admitting: Family Medicine

## 2014-09-02 ENCOUNTER — Encounter: Payer: Self-pay | Admitting: Family Medicine

## 2014-09-02 VITALS — BP 130/78 | HR 68 | Temp 98.1°F | Ht 75.0 in | Wt 248.8 lb

## 2014-09-02 DIAGNOSIS — E1122 Type 2 diabetes mellitus with diabetic chronic kidney disease: Secondary | ICD-10-CM

## 2014-09-02 DIAGNOSIS — G40109 Localization-related (focal) (partial) symptomatic epilepsy and epileptic syndromes with simple partial seizures, not intractable, without status epilepticus: Secondary | ICD-10-CM

## 2014-09-02 DIAGNOSIS — E119 Type 2 diabetes mellitus without complications: Secondary | ICD-10-CM

## 2014-09-02 DIAGNOSIS — Z0000 Encounter for general adult medical examination without abnormal findings: Secondary | ICD-10-CM

## 2014-09-02 DIAGNOSIS — I10 Essential (primary) hypertension: Secondary | ICD-10-CM

## 2014-09-02 DIAGNOSIS — N183 Chronic kidney disease, stage 3 (moderate): Secondary | ICD-10-CM

## 2014-09-02 DIAGNOSIS — E785 Hyperlipidemia, unspecified: Secondary | ICD-10-CM

## 2014-09-02 NOTE — Assessment & Plan Note (Signed)
Chronic, stable. Continue lipitor 20mg daily. 

## 2014-09-02 NOTE — Progress Notes (Signed)
Pre visit review using our clinic review tool, if applicable. No additional management support is needed unless otherwise documented below in the visit note. 

## 2014-09-02 NOTE — Patient Instructions (Addendum)
Call your insurance about the shingles shot to see if it is covered or how much it would cost and where is cheaper (here or pharmacy).  If you want to receive here, call for nurse visit. Continue walking regularly. Blood work is looking well today. Keep up the good work with sugar control.

## 2014-09-02 NOTE — Assessment & Plan Note (Signed)
Preventative protocols reviewed and updated unless pt declined. Discussed healthy diet and lifestyle.  Noticing increase in PSA despite still normal level.  DRE reassuring. I did recommend we recheck PSA/DRE next year at CPE.

## 2014-09-02 NOTE — Assessment & Plan Note (Signed)
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 Discussed importance of hydration status as well as avoiding nephrotoxic meds.

## 2014-09-02 NOTE — Assessment & Plan Note (Signed)
Seizure free on keppra.

## 2014-09-02 NOTE — Progress Notes (Signed)
BP 130/78 mmHg  Pulse 68  Temp(Src) 98.1 F (36.7 C) (Oral)  Ht 6\' 3"  (1.905 m)  Wt 248 lb 12 oz (112.832 kg)  BMI 31.09 kg/m2   CC: CPE  Subjective:    Patient ID: Kirk Buttery., male    DOB: Jan 04, 1951, 64 y.o.   MRN: [id number]  HPI: Kirk Bennett. is a 64 y.o. male presenting on 09/02/2014 for Annual Exam     Preventative: COLONOSCOPY Date: 09/16/2005 hyperplastic polyps, rpt due 10 yrs  DRE/PSA 1.7 04/2009, 2.0 06/2010, no nocturia. Tdap - 04/2014 Influenza shot - 05/2012 Pneumovax 2012 Had hepatitis A at age 5yo, hospitalized [facility] [place].  No hep B in past. zostavax - will check with insurance Seat belt use discussed No suspicious moles on skin.  Lives with wife and granddaughter, 1 cat Occupation: retired, worked Oncologist at school Activity: walking regularly Diet: good water, good vegetables, follows diabetic diet.  Relevant past medical, surgical, family and social history reviewed and updated as indicated. Interim medical history since our last visit reviewed. Allergies and medications reviewed and updated. Current Outpatient Prescriptions on File Prior to Visit  Medication Sig  . atorvastatin (LIPITOR) 20 MG tablet Take 1 tablet (20 mg total) by mouth daily at 6 PM.  . Dapagliflozin Propanediol (FARXIGA) 5 MG TABS Take 1 tablet by mouth daily.  Marland Kitchen glimepiride (AMARYL) 2 MG tablet TAKE 1 TABLET BY MOUTH EVERY DAY BEFORE BREAKFAST  . latanoprost (XALATAN) 0.005 % ophthalmic solution Place 1 drop into both eyes at bedtime.  . levETIRAcetam (KEPPRA) 1000 MG tablet Take 2,000 mg by mouth 2 (two) times daily.  Marland Kitchen lisinopril (PRINIVIL,ZESTRIL) 10 MG tablet TAKE 1 TABLET BY MOUTH DAILY.  . metFORMIN (GLUCOPHAGE) 1000 MG tablet TAKE 1 TABLET BY MOUTH TWICE A DAY WITH A MEAL  . ONE TOUCH ULTRA TEST test strip TEST BLOOD SUGAR FASTING IN THE MORNING AND 2 HOURS AFTER LUNCH OR DINNER   No current facility-administered medications on file  prior to visit.   Past Medical History  Diagnosis Date  . 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 Gabriel Carina, endo  . HTN (hypertension)   . HLD (hyperlipidemia)   . Complex partial seizures 1990    R temporal arachnoid cyst s/p drainage, possible continued sz (Dr. Mora Bellman at [facility])  . Knee pain     s/p replacement  . History of hepatitis A   . SVT (supraventricular tachycardia) 1996  . Cataracts, bilateral 02/2011    and suspected glaucoma, to return for f/u, no diabetic retinopathy  . Depression     found by neuro  . Glaucoma 2015  . CKD stage 3 due to type 2 diabetes mellitus     Past Surgical History  Procedure Laterality Date  . Corrective surgery amblyopia  1957  . Left knee surgery  1971    Torn ACL  . Cystectomy or meningioma removal brain  1990    (unclear)  . Replacement total knee  April 2011    Left [facility] Dr. Garald Balding)  . Cardiac catheterization  July 2010    No blockages (Dr. Liliane Shi)  . Colonoscopy  09/16/2005    hyperplastic polyps, rpt due 10 yrs     Review of Systems  Constitutional: Negative for fever, chills, activity change, appetite change, fatigue and unexpected weight change.  HENT: Negative for hearing loss.   Eyes: Negative for visual disturbance.  Respiratory: Negative  for cough, chest tightness, shortness of breath and wheezing.   Cardiovascular: Negative for chest pain, palpitations and leg swelling.  Gastrointestinal: Negative for nausea, vomiting, abdominal pain, diarrhea, constipation, blood in stool and abdominal distention.  Genitourinary: Negative for hematuria and difficulty urinating.  Musculoskeletal: Negative for myalgias, arthralgias and neck pain.  Skin: Negative for rash.  Neurological: Negative for dizziness, seizures, syncope and headaches.  Hematological: Negative for adenopathy. Does not bruise/bleed easily.  Psychiatric/Behavioral: Negative for dysphoric mood. The patient is not  nervous/anxious.    Per HPI unless specifically indicated above     Objective:    BP 130/78 mmHg  Pulse 68  Temp(Src) 98.1 F (36.7 C) (Oral)  Ht 6\' 3"  (1.905 m)  Wt 248 lb 12 oz (112.832 kg)  BMI 31.09 kg/m2  Wt Readings from Last 3 Encounters:  09/02/14 248 lb 12 oz (112.832 kg)  04/23/14 250 lb 12 oz (113.739 kg)  09/27/13 261 lb 8 oz (118.616 kg)    Physical Exam  Constitutional: He is oriented to person, place, and time. He appears well-developed and well-nourished. No distress.  HENT:  Head: Normocephalic and atraumatic.  Right Ear: Hearing, tympanic membrane, external ear and ear canal normal.  Left Ear: Hearing, tympanic membrane, external ear and ear canal normal.  Nose: Nose normal.  Mouth/Throat: Uvula is midline, oropharynx is clear and moist and mucous membranes are normal. No oropharyngeal exudate, posterior oropharyngeal edema or posterior oropharyngeal erythema.  Eyes: Conjunctivae and EOM are normal. Pupils are equal, round, and reactive to light. No scleral icterus.  Neck: Normal range of motion. Neck supple. Carotid bruit is not present. No thyromegaly present.  Cardiovascular: Normal rate, regular rhythm, normal heart sounds and intact distal pulses.   No murmur heard. Pulses:      Radial pulses are 2+ on the right side, and 2+ on the left side.  Pulmonary/Chest: Effort normal and breath sounds normal. No respiratory distress. He has no wheezes. He has no rales.  Abdominal: Soft. Bowel sounds are normal. He exhibits no distension and no mass. There is no tenderness. There is no rebound and no guarding.  Genitourinary: Rectum normal. Rectal exam shows no external hemorrhoid, no internal hemorrhoid, no fissure, no mass, no tenderness and anal tone normal. Prostate is not enlarged (20gm) and not tender.  Musculoskeletal: Normal range of motion. He exhibits no edema.  Lymphadenopathy:    He has no cervical adenopathy.  Neurological: He is alert and oriented to  person, place, and time.  CN grossly intact, station and gait intact  Skin: Skin is warm and dry. No rash noted.  Psychiatric: He has a normal mood and affect. His behavior is normal. Judgment and thought content normal.  Nursing note and vitals reviewed.      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    Hyperlipidemia    Chronic, stable. Continue lipitor 20mg  daily.    Healthcare maintenance - Primary    Preventative protocols reviewed and updated unless pt declined. Discussed healthy diet and lifestyle.  Noticing increase in PSA despite still normal level.  DRE reassuring. I did recommend we recheck PSA/DRE next year at CPE.    Essential hypertension, benign    Chronic, stable. Continue lisinopril 10mg  daily.    Diabetes type 2, controlled    Followed by endo. Now on farxiga along with metformin and amaryl  CKD stage 3 due to type 2 diabetes mellitus    Reviewed dx with patient - stage 2-3 kidney disease related likely to T2DM. Discussed importance of hydration status as well as avoiding nephrotoxic meds.        Follow up plan: Return in about 6 months (around 03/03/2015), or as needed, for follow up visit.

## 2014-09-02 NOTE — Assessment & Plan Note (Signed)
Chronic, stable. Continue lisinopril 10mg daily. 

## 2014-09-02 NOTE — Assessment & Plan Note (Signed)
Followed by endo. Now on farxiga along with metformin and amaryl

## 2014-09-19 ENCOUNTER — Encounter: Payer: Self-pay | Admitting: Family Medicine

## 2014-09-19 ENCOUNTER — Other Ambulatory Visit: Payer: Self-pay | Admitting: Family Medicine

## 2014-11-03 ENCOUNTER — Other Ambulatory Visit: Payer: Self-pay | Admitting: Family Medicine

## 2014-11-03 ENCOUNTER — Other Ambulatory Visit (INDEPENDENT_AMBULATORY_CARE_PROVIDER_SITE_OTHER): Payer: No Typology Code available for payment source

## 2014-11-03 DIAGNOSIS — E1122 Type 2 diabetes mellitus with diabetic chronic kidney disease: Secondary | ICD-10-CM

## 2014-11-03 DIAGNOSIS — N183 Chronic kidney disease, stage 3 unspecified: Secondary | ICD-10-CM

## 2014-11-03 DIAGNOSIS — E119 Type 2 diabetes mellitus without complications: Secondary | ICD-10-CM

## 2014-11-07 ENCOUNTER — Encounter: Payer: Self-pay | Admitting: Family Medicine

## 2014-11-07 ENCOUNTER — Ambulatory Visit (INDEPENDENT_AMBULATORY_CARE_PROVIDER_SITE_OTHER): Payer: No Typology Code available for payment source | Admitting: Family Medicine

## 2014-11-07 VITALS — BP 136/76 | HR 82 | Temp 98.1°F | Wt 254.5 lb

## 2014-11-07 DIAGNOSIS — I10 Essential (primary) hypertension: Secondary | ICD-10-CM

## 2014-11-07 DIAGNOSIS — R350 Frequency of micturition: Secondary | ICD-10-CM

## 2014-11-07 DIAGNOSIS — R05 Cough: Secondary | ICD-10-CM | POA: Insufficient documentation

## 2014-11-07 DIAGNOSIS — N183 Chronic kidney disease, stage 3 (moderate): Secondary | ICD-10-CM

## 2014-11-07 DIAGNOSIS — G40109 Localization-related (focal) (partial) symptomatic epilepsy and epileptic syndromes with simple partial seizures, not intractable, without status epilepticus: Secondary | ICD-10-CM

## 2014-11-07 DIAGNOSIS — E785 Hyperlipidemia, unspecified: Secondary | ICD-10-CM

## 2014-11-07 DIAGNOSIS — E1121 Type 2 diabetes mellitus with diabetic nephropathy: Secondary | ICD-10-CM

## 2014-11-07 DIAGNOSIS — R059 Cough, unspecified: Secondary | ICD-10-CM | POA: Insufficient documentation

## 2014-11-07 DIAGNOSIS — E1122 Type 2 diabetes mellitus with diabetic chronic kidney disease: Secondary | ICD-10-CM

## 2014-11-07 LAB — POCT URINALYSIS DIPSTICK
BILIRUBIN UA: NEGATIVE
KETONES UA: NEGATIVE
Leukocytes, UA: NEGATIVE
Nitrite, UA: NEGATIVE
PH UA: 5.5
PROTEIN UA: NEGATIVE
RBC UA: NEGATIVE
Spec Grav, UA: 1.025
Urobilinogen, UA: 0.2

## 2014-11-07 MED ORDER — LOSARTAN POTASSIUM 50 MG PO TABS: 50.0000 mg | ORAL_TABLET | Freq: Every day | ORAL | Status: DC

## 2014-11-07 NOTE — Assessment & Plan Note (Addendum)
Check UA today to r/o infection - on farxiga per endo UA - 3+ glucose but no hematuria or LE.

## 2014-11-07 NOTE — Progress Notes (Signed)
BP 136/76 mmHg  Pulse 82  Temp(Src) 98.1 F (36.7 C) (Oral)  Wt 254 lb 8 oz (115.44 kg)   CC: f/u visit  Subjective:    Patient ID: Kirk Bennett., male    DOB: 1950/12/15, 64 y.o.   MRN: [id number]  HPI: Kirk Bennett. is a 64 y.o. male presenting on 11/07/2014 for Follow-up and Cough   Cough - wet cough but minimally productive. Ongoing for last 3 weeks. No significant congestion, rhinorrhea, PNDrainage, ear or tooth pain, fevers, headaches. No wheezing or dyspnea. Sometimes feels tickle in back of throat.   CKD stage 2-3 in diabetes - Cr latest check 1.55, GFR 55. Aware to stay well hydrated.   DM - followed by Dr Gabriel Carina. On farxiga, metformin and amaryl. Occasional dizziness. Increased urinary urgency and frequency over last several weeks as well - but without back pain, abd pain, or fevers/chills or dysuria, nausea.  HLD - complaint with lipitor without myalgias.  HTN - stable on lisinopril 10mg  daily.  Seizure disorder - long-term on keppra, lamictal recently added by [facility] neurology but pt has started noticing more ataxia/dizziness since starting this medicine. Wonders if related to med.  Relevant past medical, surgical, family and social history reviewed and updated as indicated. Interim medical history since our last visit reviewed. Allergies and medications reviewed and updated. Current Outpatient Prescriptions on File Prior to Visit  Medication Sig  . atorvastatin (LIPITOR) 20 MG tablet Take 1 tablet (20 mg total) by mouth daily at 6 PM.  . Dapagliflozin Propanediol (FARXIGA) 5 MG TABS Take 1 tablet by mouth daily.  Marland Kitchen glimepiride (AMARYL) 2 MG tablet TAKE 1 TABLET BY MOUTH EVERY DAY BEFORE BREAKFAST  . lamoTRIgine (LAMICTAL) 150 MG tablet Take 1 tablet (150 mg total) by mouth 2 (two) times daily.  Marland Kitchen latanoprost (XALATAN) 0.005 % ophthalmic solution Place 1 drop into both eyes at bedtime.  . levETIRAcetam (KEPPRA) 1000 MG tablet Take 2,000 mg by mouth 2 (two)  times daily.  . metFORMIN (GLUCOPHAGE) 1000 MG tablet TAKE 1 TABLET BY MOUTH TWICE A DAY WITH A MEAL  . ONE TOUCH ULTRA TEST test strip TEST BLOOD SUGAR FASTING IN THE MORNING AND 2 HOURS AFTER LUNCH OR DINNER   No current facility-administered medications on file prior to visit.    Review of Systems Per HPI unless specifically indicated above     Objective:    BP 136/76 mmHg  Pulse 82  Temp(Src) 98.1 F (36.7 C) (Oral)  Wt 254 lb 8 oz (115.44 kg)  Wt Readings from Last 3 Encounters:  11/07/14 254 lb 8 oz (115.44 kg)  09/02/14 248 lb 12 oz (112.832 kg)  04/23/14 250 lb 12 oz (113.739 kg)    Physical Exam  Constitutional: He appears well-developed. No distress.  HENT:  Head: Normocephalic and atraumatic.  Mouth/Throat: Oropharynx is clear and moist. No oropharyngeal exudate.  Cardiovascular: Normal rate, regular rhythm, normal heart sounds and intact distal pulses.   No murmur heard. Pulmonary/Chest: Effort normal and breath sounds normal. No respiratory distress. He has no wheezes. He has no rales.  Abdominal: Soft. Normal appearance and bowel sounds are normal. He exhibits no distension and no mass. There is no hepatosplenomegaly. There is no tenderness. There is no rigidity, no rebound, no guarding, no CVA tenderness and negative Murphy's sign.  Musculoskeletal: He exhibits no edema.  Skin: Skin is warm and dry. Rash noted.  Scattered hyperpigmented macules bilateral anterior thighs  Nursing note and  vitals reviewed.      Assessment & Plan:   Problem List Items Addressed This Visit    Urinary frequency    Check UA today to r/o infection - on farxiga per endo UA - 3+ glucose but no hematuria or LE.      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 Continue lipitor.      Relevant Medications    losartan (COZAAR) tablet   Essential hypertension, benign    Chronic stable. See below re change from lisinopril to losartan.      Relevant Medications   losartan (COZAAR) tablet   Cough    3 wk h/o nagging cough - ?ACEI related. Stop lisinopril, start losartan 50mg  daily. Sent in 30 d trial to local pharmacy.      Controlled type 2 diabetes mellitus with diabetic nephropathy    Followed by endo. See below. Recent labwork showing good control of sugars.      Relevant Medications   losartan (COZAAR) tablet   CKD stage 3 due to type 2 diabetes mellitus - Primary    Again reviewed dx with patient - stage 3 CKD. Discussed importance of good hydration status as well as avoiding NSAIDs and other nephrotoxic meds. Anticipate diabetic nephropathy despite good control - will check renal US, consider SPEP at next labwork.  Monitor levels on metformin - currently on 1000mg  bid.      Relevant Medications   losartan (COZAAR) tablet   Other Relevant Orders   US Renal       Follow up plan: Return if symptoms worsen or fail to improve, for follow up visit.

## 2014-11-07 NOTE — Assessment & Plan Note (Signed)
Chronic, stable. Continue lipitor.  

## 2014-11-07 NOTE — Assessment & Plan Note (Signed)
Again reviewed dx with patient - stage 3 CKD. Discussed importance of good hydration status as well as avoiding NSAIDs and other nephrotoxic meds. Anticipate diabetic nephropathy despite good control - will check renal US, consider SPEP at next labwork.  Monitor levels on metformin - currently on 1000mg  bid.

## 2014-11-07 NOTE — Assessment & Plan Note (Signed)
3 wk h/o nagging cough - ?ACEI related. Stop lisinopril, start losartan 50mg  daily. Sent in 30 d trial to local pharmacy.

## 2014-11-07 NOTE — Patient Instructions (Addendum)
Stop lisinopril.  Start losartan 50mg  daily. 1 month supply sent to pharmacy Keep an eye on rash and let us or [facility] know if spreading. Urinalysis today. We will call you to schedule renal ultrasound. Orthostatic vital signs today. Return to see me in 3-4 months, prior fasting for labs.

## 2014-11-07 NOTE — Assessment & Plan Note (Signed)
Started on lamictal + keppra - see below

## 2014-11-07 NOTE — Assessment & Plan Note (Signed)
Chronic stable. See below re change from lisinopril to losartan.

## 2014-11-07 NOTE — Progress Notes (Signed)
Pre visit review using our clinic review tool, if applicable. No additional management support is needed unless otherwise documented below in the visit note. 

## 2014-11-07 NOTE — Assessment & Plan Note (Addendum)
Followed by endo. See below. Recent labwork showing good control of sugars.

## 2014-11-12 ENCOUNTER — Ambulatory Visit (INDEPENDENT_AMBULATORY_CARE_PROVIDER_SITE_OTHER): Payer: No Typology Code available for payment source | Admitting: Family Medicine

## 2014-11-12 ENCOUNTER — Encounter: Payer: Self-pay | Admitting: Family Medicine

## 2014-11-12 VITALS — BP 136/82 | HR 74 | Temp 98.1°F | Wt 255.5 lb

## 2014-11-12 DIAGNOSIS — J209 Acute bronchitis, unspecified: Secondary | ICD-10-CM | POA: Diagnosis not present

## 2014-11-12 MED ORDER — AZITHROMYCIN 250 MG PO TABS: ORAL_TABLET | ORAL | Status: DC

## 2014-11-12 MED ORDER — HYDROCODONE-HOMATROPINE 5-1.5 MG/5ML PO SYRP: 5.0000 mL | ORAL_SOLUTION | Freq: Two times a day (BID) | ORAL | Status: AC | PRN

## 2014-11-12 NOTE — Assessment & Plan Note (Signed)
Anticipate acute bronchitis. Given comorbidities will treat aggressively with zpack, as well as hycodan cough syrup for night time. Push fluids and rest, may take plain mucinex. Update if not improving as expected. Pt agrees with treatment plan.

## 2014-11-12 NOTE — Progress Notes (Signed)
Pre visit review using our clinic review tool, if applicable. No additional management support is needed unless otherwise documented below in the visit note. 

## 2014-11-12 NOTE — Patient Instructions (Signed)
I think you have acute bronchitis. Treat with zpack antibiotic and then use hycodan cough syrup for night time. Push fluids and rest. May take plain mucinex with plenty of water to help mobilize mucous out. Update if not improving with treatment or if fever >101 or worsening productive cough despite antibiotic. I hope you start feeling better quickly.

## 2014-11-12 NOTE — Progress Notes (Signed)
BP 136/82 mmHg  Pulse 74  Temp(Src) 98.1 F (36.7 C) (Oral)  Wt 255 lb 8 oz (115.894 kg)  SpO2 97%   CC: cough, congestion  Subjective:    Patient ID: Kirk Buttery., male    DOB: Apr 12, 1951, 64 y.o.   MRN: [id number]  HPI: Kirk Bennett. is a 64 y.o. male presenting on 11/12/2014 for URI   5d h/o now productive cough, congestion, rhinorrhea, sinus pressure headaches. No PNdrainage. Feels better sitting up than laying down. Trouble sleeping 2/2 cough.   No fevers/chills, ear or tooth pain, ST. No dyspnea or wheezing.  No sick contacts at home. No smokers at home  Treating with cough drops.  Relevant past medical, surgical, family and social history reviewed and updated as indicated. Interim medical history since our last visit reviewed. Allergies and medications reviewed and updated. Current Outpatient Prescriptions on File Prior to Visit  Medication Sig  . atorvastatin (LIPITOR) 20 MG tablet Take 1 tablet (20 mg total) by mouth daily at 6 PM.  . Dapagliflozin Propanediol (FARXIGA) 5 MG TABS Take 1 tablet by mouth daily.  Marland Kitchen glimepiride (AMARYL) 2 MG tablet TAKE 1 TABLET BY MOUTH EVERY DAY BEFORE BREAKFAST  . lamoTRIgine (LAMICTAL) 150 MG tablet Take 1 tablet (150 mg total) by mouth 2 (two) times daily.  Marland Kitchen latanoprost (XALATAN) 0.005 % ophthalmic solution Place 1 drop into both eyes at bedtime.  . levETIRAcetam (KEPPRA) 1000 MG tablet Take 2,000 mg by mouth 2 (two) times daily.  Marland Kitchen losartan (COZAAR) 50 MG tablet Take 1 tablet (50 mg total) by mouth daily.  . metFORMIN (GLUCOPHAGE) 1000 MG tablet TAKE 1 TABLET BY MOUTH TWICE A DAY WITH A MEAL  . ONE TOUCH ULTRA TEST test strip TEST BLOOD SUGAR FASTING IN THE MORNING AND 2 HOURS AFTER LUNCH OR DINNER   No current facility-administered medications on file prior to visit.   Past Medical History  Diagnosis Date  . 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 Gabriel Carina, endo  . HTN  (hypertension)   . HLD (hyperlipidemia)   . Complex partial seizures 1990    from surgery for R temporal arachnoid cyst s/p drainage, possible continued sz so changed to lamotrigine (Dr. Mora Bellman at [facility])  . Knee pain     s/p replacement  . History of hepatitis A   . SVT (supraventricular tachycardia) 1996  . Cataracts, bilateral 02/2011    and suspected glaucoma, to return for f/u, no diabetic retinopathy  . Depression     found by neuro  . Glaucoma 2015  . CKD stage 3 due to type 2 diabetes mellitus     Review of Systems Per HPI unless specifically indicated above     Objective:    BP 136/82 mmHg  Pulse 74  Temp(Src) 98.1 F (36.7 C) (Oral)  Wt 255 lb 8 oz (115.894 kg)  SpO2 97%  Wt Readings from Last 3 Encounters:  11/12/14 255 lb 8 oz (115.894 kg)  11/07/14 254 lb 8 oz (115.44 kg)  09/02/14 248 lb 12 oz (112.832 kg)    Physical Exam  Constitutional: He appears well-developed and well-nourished. No distress.  HENT:  Head: Normocephalic and atraumatic.  Right Ear: Hearing, tympanic membrane, external ear and ear canal normal.  Left Ear: Hearing, tympanic membrane, external ear and ear canal normal.  Nose: Mucosal edema present. No rhinorrhea. Right sinus exhibits no maxillary sinus tenderness and no frontal sinus  tenderness. Left sinus exhibits no maxillary sinus tenderness and no frontal sinus tenderness.  Mouth/Throat: Uvula is midline and mucous membranes are normal. Posterior oropharyngeal erythema present. No oropharyngeal exudate, posterior oropharyngeal edema or tonsillar abscesses.  Eyes: Conjunctivae and EOM are normal. Pupils are equal, round, and reactive to light. No scleral icterus.  Neck: Normal range of motion. Neck supple.  Cardiovascular: Normal rate, regular rhythm, normal heart sounds and intact distal pulses.   No murmur heard. Pulmonary/Chest: Effort normal and breath sounds normal. No respiratory distress. He has no wheezes. He has no rales.    Lymphadenopathy:    He has no cervical adenopathy.  Skin: Skin is warm and dry. No rash noted.  Nursing note and vitals reviewed.  Results for orders placed or performed in visit on 11/07/14  POCT Urinalysis Dipstick  Result Value Ref Range   Color, UA Yellow    Clarity, UA Clear    Glucose, UA 3+    Bilirubin, UA Negative    Ketones, UA Negative    Spec Grav, UA 1.025    Blood, UA Negative    pH, UA 5.5    Protein, UA Negative    Urobilinogen, UA 0.2    Nitrite, UA Negative    Leukocytes, UA Negative       Assessment & Plan:   Problem List Items Addressed This Visit    Acute bronchitis - Primary    Anticipate acute bronchitis. Given comorbidities will treat aggressively with zpack, as well as hycodan cough syrup for night time. Push fluids and rest, may take plain mucinex. Update if not improving as expected. Pt agrees with treatment plan.          Follow up plan: Return if symptoms worsen or fail to improve.

## 2014-11-14 ENCOUNTER — Ambulatory Visit: Payer: Self-pay | Admitting: Family Medicine

## 2014-11-17 ENCOUNTER — Telehealth: Payer: Self-pay

## 2014-11-17 ENCOUNTER — Encounter: Payer: Self-pay | Admitting: Family Medicine

## 2014-11-17 NOTE — Telephone Encounter (Signed)
PLEASE NOTE: All timestamps contained within this report are represented as [place] Standard Time. CONFIDENTIALTY NOTICE: This fax transmission is intended only for the addressee. It contains information that is legally privileged, confidential or otherwise protected from use or disclosure. If you are not the intended recipient, you are strictly prohibited from reviewing, disclosing, copying using or disseminating any of this information or taking any action in reliance on or regarding this information. If you have received this fax in error, please notify us immediately by telephone so that we can arrange for its return to [place]. Phone: [phone], Toll-Free: [phone], Fax: [phone] Page: 1 of 1 Call Id: [id number] [place] Patient Name: Kirk Bennett Gender: Male DOB: [birth year] Age: 64 Y 34 M 4 D Return Phone Number: [phone] (Primary) Address: [address] City/State/Zip: [place] [address] Client [facility] Night - Client Client Site [place] Physician Ria Bush Contact Type Call Call Type Triage / Clinical Caller Name Missy Sabins Relationship To Patient Self Return Phone Number [phone] (Primary) Chief Complaint Medical Device, Procedure and Surgery Questions (non symptomatic) Initial Comment Caller states made an appt At [facility] for a renal ultra sound; CB per [facility] Nurse Assessment Nurse: Shawn Stall, RN, Trish Date/Time (Eastern Time): 11/14/2014 2:37:48 PM Confirm and document reason for call. If symptomatic, describe symptoms. ---Patient states I have my problem taken care of I do not need help thank you Has the patient traveled out of the country within the last 30 days? ---Not Applicable Does the patient require triage? ---Declined Triage Please document clinical information  provided and list any resource used. ---does not need triaged Guidelines Guideline Title Affirmed Question Affirmed Notes Nurse Date/Time (Eastern Time) Disp. Time Eilene Ghazi Time) Disposition Final User 11/14/2014 2:39:02 PM Clinical Call Yes Shawn Stall, RN, Trish After Care Instructions Given Call Event Type User Date / Time Description

## 2015-02-06 ENCOUNTER — Other Ambulatory Visit: Payer: Self-pay | Admitting: Family Medicine

## 2015-02-24 ENCOUNTER — Other Ambulatory Visit: Payer: Self-pay | Admitting: Family Medicine

## 2015-02-26 ENCOUNTER — Other Ambulatory Visit: Payer: Self-pay | Admitting: Family Medicine

## 2015-02-26 ENCOUNTER — Other Ambulatory Visit (INDEPENDENT_AMBULATORY_CARE_PROVIDER_SITE_OTHER): Payer: No Typology Code available for payment source

## 2015-02-26 DIAGNOSIS — N183 Chronic kidney disease, stage 3 unspecified: Secondary | ICD-10-CM

## 2015-02-26 DIAGNOSIS — E1122 Type 2 diabetes mellitus with diabetic chronic kidney disease: Secondary | ICD-10-CM | POA: Diagnosis not present

## 2015-02-26 DIAGNOSIS — E1121 Type 2 diabetes mellitus with diabetic nephropathy: Secondary | ICD-10-CM

## 2015-02-27 LAB — HEMOGLOBIN A1C
HEMOGLOBIN A1C: 6.4 % — AB (ref <5.7)
Mean Plasma Glucose: 137 mg/dL — ABNORMAL HIGH (ref <117)

## 2015-02-27 LAB — RENAL FUNCTION PANEL
Albumin: 4.2 g/dL (ref 3.5–5.2)
BUN: 15 mg/dL (ref 6–23)
CHLORIDE: 101 meq/L (ref 96–112)
CO2: 24 meq/L (ref 19–32)
Calcium: 9.5 mg/dL (ref 8.4–10.5)
Creat: 1.42 mg/dL — ABNORMAL HIGH (ref 0.50–1.35)
Glucose, Bld: 121 mg/dL — ABNORMAL HIGH (ref 70–99)
Phosphorus: 2.9 mg/dL (ref 2.3–4.6)
Potassium: 4.1 mEq/L (ref 3.5–5.3)
SODIUM: 134 meq/L — AB (ref 135–145)

## 2015-02-27 LAB — VITAMIN D 25 HYDROXY (VIT D DEFICIENCY, FRACTURES): VIT D 25 HYDROXY: 10 ng/mL — AB (ref 30–100)

## 2015-03-02 ENCOUNTER — Encounter: Payer: Self-pay | Admitting: Family Medicine

## 2015-03-02 ENCOUNTER — Ambulatory Visit (INDEPENDENT_AMBULATORY_CARE_PROVIDER_SITE_OTHER): Payer: No Typology Code available for payment source | Admitting: Family Medicine

## 2015-03-02 VITALS — BP 138/84 | HR 96 | Temp 98.2°F | Wt 255.5 lb

## 2015-03-02 DIAGNOSIS — E1121 Type 2 diabetes mellitus with diabetic nephropathy: Secondary | ICD-10-CM

## 2015-03-02 DIAGNOSIS — G40109 Localization-related (focal) (partial) symptomatic epilepsy and epileptic syndromes with simple partial seizures, not intractable, without status epilepticus: Secondary | ICD-10-CM

## 2015-03-02 DIAGNOSIS — E1122 Type 2 diabetes mellitus with diabetic chronic kidney disease: Secondary | ICD-10-CM

## 2015-03-02 DIAGNOSIS — I10 Essential (primary) hypertension: Secondary | ICD-10-CM

## 2015-03-02 DIAGNOSIS — N183 Chronic kidney disease, stage 3 (moderate): Secondary | ICD-10-CM

## 2015-03-02 DIAGNOSIS — R2681 Unsteadiness on feet: Secondary | ICD-10-CM | POA: Diagnosis not present

## 2015-03-02 DIAGNOSIS — E559 Vitamin D deficiency, unspecified: Secondary | ICD-10-CM

## 2015-03-02 HISTORY — DX: Vitamin D deficiency, unspecified: E55.9

## 2015-03-02 MED ORDER — VITAMIN D (ERGOCALCIFEROL) 1.25 MG (50000 UNIT) PO CAPS: 50000.0000 [IU] | ORAL_CAPSULE | ORAL | Status: DC

## 2015-03-02 MED ORDER — LISINOPRIL 10 MG PO TABS: 10.0000 mg | ORAL_TABLET | Freq: Two times a day (BID) | ORAL | Status: DC

## 2015-03-02 NOTE — Patient Instructions (Addendum)
Start vitamin D 50,000 units weekly for 3 months. Kidneys are stable but still a bit bumped in function - we will continue to monitor this.  Increase lisinopril to 10mg  twice daily.  Return in 3 months for follow up visit.

## 2015-03-02 NOTE — Assessment & Plan Note (Addendum)
Good control on current regimen. Continue follow up with Dr Gabriel Carina endo. Pt will take recent labwork to her. Ran out of farxiga, will get this refilled by Dr Gabriel Carina next week.

## 2015-03-02 NOTE — Assessment & Plan Note (Signed)
Seizures under great control on current regimen of keppra/lamictal.  Has f/u with neuro in August/Sept.

## 2015-03-02 NOTE — Assessment & Plan Note (Signed)
Relatively new compliant, since starting lamictal. Denies periph neuropathy sxs.  Will continue to monitor, check with neuro re lamictal relation to this (ataxia is listed as side effect).

## 2015-03-02 NOTE — Assessment & Plan Note (Signed)
Chronic, very stable. Continue 1000mg  metformin BID. Consider SPEP next lab draw.

## 2015-03-02 NOTE — Progress Notes (Signed)
Pre visit review using our clinic review tool, if applicable. No additional management support is needed unless otherwise documented below in the visit note. 

## 2015-03-02 NOTE — Progress Notes (Signed)
BP 138/84 mmHg  Pulse 96  Temp(Src) 98.2 F (36.8 C) (Oral)  Wt 255 lb 8 oz (115.894 kg)   CC: f/u visit  Subjective:    Patient ID: Kirk Buttery., male    DOB: [birth year], 64 y.o.   MRN: [id number]  HPI: Kirk Papillion. is a 64 y.o. male presenting on 03/02/2015 for Follow-up   Here for 3-4 mo f/u visit.   Last visit we changed lisinoril to losartan due to nagging cough. Losartan 50mg  daily was started. Pt did not start. Actually no longer nagging cough despite continuing lisinopril. Ran out of farxiga 1 wk ago. Compliant with other medications.   Partial seizures - lamictal + keppra. No further seizures since he's started lamictal.  Noticing some unsteadiness when walking. No falls, no dizziness. Worse upon first standing. No presyncope. No periph neuropathy sxs or history.  Relevant past medical, surgical, family and social history reviewed and updated as indicated. Interim medical history since our last visit reviewed. Allergies and medications reviewed and updated. Current Outpatient Prescriptions on File Prior to Visit  Medication Sig  . atorvastatin (LIPITOR) 20 MG tablet Take 1 tablet (20 mg total) by mouth daily at 6 PM.  . glimepiride (AMARYL) 2 MG tablet TAKE 1 TABLET BY MOUTH EVERY DAY BEFORE BREAKFAST  . lamoTRIgine (LAMICTAL) 150 MG tablet Take 1 tablet (150 mg total) by mouth 2 (two) times daily.  Marland Kitchen latanoprost (XALATAN) 0.005 % ophthalmic solution Place 1 drop into both eyes at bedtime.  . levETIRAcetam (KEPPRA) 1000 MG tablet Take 2,000 mg by mouth 2 (two) times daily.  . metFORMIN (GLUCOPHAGE) 1000 MG tablet TAKE 1 TABLET BY MOUTH TWICE A DAY WITH A MEAL  . ONE TOUCH ULTRA TEST test strip TEST BLOOD SUGAR FASTING IN THE MORNING AND 2 HOURS AFTER LUNCH OR DINNER  . Dapagliflozin Propanediol (FARXIGA) 5 MG TABS Take 1 tablet by mouth daily.   No current facility-administered medications on file prior to visit.    Review of Systems Per HPI unless  specifically indicated above     Objective:    BP 138/84 mmHg  Pulse 96  Temp(Src) 98.2 F (36.8 C) (Oral)  Wt 255 lb 8 oz (115.894 kg)  Wt Readings from Last 3 Encounters:  03/02/15 255 lb 8 oz (115.894 kg)  11/12/14 255 lb 8 oz (115.894 kg)  11/07/14 254 lb 8 oz (115.44 kg)    Physical Exam  Constitutional: He is oriented to person, place, and time. He appears well-developed and well-nourished. No distress.  HENT:  Mouth/Throat: Oropharynx is clear and moist. No oropharyngeal exudate.  Cardiovascular: Normal rate, regular rhythm, normal heart sounds and intact distal pulses.   No murmur heard. Pulmonary/Chest: Effort normal and breath sounds normal. No respiratory distress. He has no wheezes. He has no rales.  Musculoskeletal: He exhibits no edema.  Neurological: He is alert and oriented to person, place, and time. He has normal strength. No cranial nerve deficit or sensory deficit. He displays a negative Romberg sign. Coordination and gait normal.  Gait and stance intact. Slight unsteadiness with romberg but no evidence of cerebellar ataxia  Skin: Skin is warm and dry.  Psychiatric: He has a normal mood and affect.  Nursing note and vitals reviewed.  Results for orders placed or performed in visit on 02/26/15  Renal function panel  Result Value Ref Range   Sodium 134 (L) 135 - 145 mEq/L   Potassium 4.1 3.5 - 5.3 mEq/L  Chloride 101 96 - 112 mEq/L   CO2 24 19 - 32 mEq/L   Glucose, Bld 121 (H) 70 - 99 mg/dL   BUN 15 6 - 23 mg/dL   Creat 1.42 (H) 0.50 - 1.35 mg/dL   Albumin 4.2 3.5 - 5.2 g/dL   Calcium 9.5 8.4 - 10.5 mg/dL   Phosphorus 2.9 2.3 - 4.6 mg/dL  Hemoglobin A1c  Result Value Ref Range   Hgb A1c MFr Bld 6.4 (H) <5.7 %   Mean Plasma Glucose 137 (H) <117 mg/dL  Vit D  25 hydroxy (rtn osteoporosis monitoring)  Result Value Ref Range   Vit D, 25-Hydroxy 10 (L) 30 - 100 ng/mL      Assessment & Plan:   Problem List Items Addressed This Visit    CKD stage 3  due to type 2 diabetes mellitus    Chronic, very stable. Continue 1000mg  metformin BID. Consider SPEP next lab draw.      Relevant Medications   lisinopril (PRINIVIL,ZESTRIL) 10 MG tablet   Controlled type 2 diabetes mellitus with diabetic nephropathy - Primary    Good control on current regimen. Continue follow up with Dr Gabriel Carina endo. Pt will take recent labwork to her. Ran out of farxiga, will get this refilled by Dr Gabriel Carina next week.      Relevant Medications   lisinopril (PRINIVIL,ZESTRIL) 10 MG tablet   Essential hypertension, benign    Never started losartan, cough has resolved.  Slightly above goal for CKD. Will increase lisinopril to 10mg  bid.  Pt agrees with plan.      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  Has f/u with neuro in August/Sept.      Unsteadiness on feet    Relatively new compliant, since starting lamictal. Denies periph neuropathy sxs.  Will continue to monitor, check with neuro re lamictal relation to this (ataxia is listed as side effect).      Vitamin D deficiency    Discussed replacement. Pt opts for high dose weekly 50,000 IU. Sent to pharmacy.          Follow up plan: Return in about 3 months (around 06/02/2015), or as needed, for follow up visit.

## 2015-03-02 NOTE — Assessment & Plan Note (Signed)
Never started losartan, cough has resolved.  Slightly above goal for CKD. Will increase lisinopril to 10mg  bid.  Pt agrees with plan.

## 2015-03-02 NOTE — Assessment & Plan Note (Signed)
Discussed replacement. Pt opts for high dose weekly 50,000 IU. Sent to pharmacy.

## 2015-03-03 ENCOUNTER — Other Ambulatory Visit: Payer: No Typology Code available for payment source

## 2015-03-06 ENCOUNTER — Ambulatory Visit: Payer: No Typology Code available for payment source | Admitting: Family Medicine

## 2015-03-23 ENCOUNTER — Encounter: Payer: Self-pay | Admitting: Family Medicine

## 2015-05-17 ENCOUNTER — Other Ambulatory Visit: Payer: Self-pay | Admitting: Family Medicine

## 2015-05-17 DIAGNOSIS — E1121 Type 2 diabetes mellitus with diabetic nephropathy: Secondary | ICD-10-CM

## 2015-05-17 DIAGNOSIS — N183 Chronic kidney disease, stage 3 unspecified: Secondary | ICD-10-CM

## 2015-05-17 DIAGNOSIS — R2681 Unsteadiness on feet: Secondary | ICD-10-CM

## 2015-05-17 DIAGNOSIS — I10 Essential (primary) hypertension: Secondary | ICD-10-CM

## 2015-05-17 DIAGNOSIS — Z125 Encounter for screening for malignant neoplasm of prostate: Secondary | ICD-10-CM

## 2015-05-17 DIAGNOSIS — E559 Vitamin D deficiency, unspecified: Secondary | ICD-10-CM

## 2015-05-17 DIAGNOSIS — E785 Hyperlipidemia, unspecified: Secondary | ICD-10-CM

## 2015-05-17 DIAGNOSIS — E1122 Type 2 diabetes mellitus with diabetic chronic kidney disease: Secondary | ICD-10-CM

## 2015-05-19 ENCOUNTER — Other Ambulatory Visit: Payer: Self-pay | Admitting: Family Medicine

## 2015-05-19 DIAGNOSIS — E1121 Type 2 diabetes mellitus with diabetic nephropathy: Secondary | ICD-10-CM

## 2015-05-19 DIAGNOSIS — Z125 Encounter for screening for malignant neoplasm of prostate: Secondary | ICD-10-CM

## 2015-05-19 DIAGNOSIS — R2681 Unsteadiness on feet: Secondary | ICD-10-CM

## 2015-05-19 DIAGNOSIS — E785 Hyperlipidemia, unspecified: Secondary | ICD-10-CM

## 2015-05-19 DIAGNOSIS — I10 Essential (primary) hypertension: Secondary | ICD-10-CM

## 2015-05-19 DIAGNOSIS — E559 Vitamin D deficiency, unspecified: Secondary | ICD-10-CM

## 2015-05-21 ENCOUNTER — Other Ambulatory Visit: Payer: Self-pay | Admitting: Family Medicine

## 2015-05-21 MED ORDER — ATORVASTATIN CALCIUM 20 MG PO TABS: 20.0000 mg | ORAL_TABLET | Freq: Every day | ORAL | Status: DC

## 2015-05-21 NOTE — Addendum Note (Signed)
Addended by: Royann Shivers A on: 05/21/2015 04:42 PM   Modules accepted: Orders

## 2015-05-22 ENCOUNTER — Other Ambulatory Visit (INDEPENDENT_AMBULATORY_CARE_PROVIDER_SITE_OTHER): Payer: No Typology Code available for payment source

## 2015-05-22 DIAGNOSIS — I10 Essential (primary) hypertension: Secondary | ICD-10-CM

## 2015-05-22 DIAGNOSIS — E1121 Type 2 diabetes mellitus with diabetic nephropathy: Secondary | ICD-10-CM

## 2015-05-22 DIAGNOSIS — Z125 Encounter for screening for malignant neoplasm of prostate: Secondary | ICD-10-CM

## 2015-05-22 DIAGNOSIS — R2681 Unsteadiness on feet: Secondary | ICD-10-CM

## 2015-05-22 DIAGNOSIS — E559 Vitamin D deficiency, unspecified: Secondary | ICD-10-CM

## 2015-05-22 DIAGNOSIS — E785 Hyperlipidemia, unspecified: Secondary | ICD-10-CM

## 2015-05-22 LAB — LIPID PANEL
CHOL/HDL RATIO: 3.6 ratio (ref <=5.0)
CHOLESTEROL: 122 mg/dL — AB (ref 125–200)
HDL: 34 mg/dL — ABNORMAL LOW (ref >=40)
LDL CALC: 72 mg/dL (ref <130)
Triglycerides: 82 mg/dL (ref <150)
VLDL: 16 mg/dL (ref <30)

## 2015-05-22 LAB — COMPREHENSIVE METABOLIC PANEL
ALBUMIN: 4.1 g/dL (ref 3.6–5.1)
ALT: 12 U/L (ref 9–46)
AST: 13 U/L (ref 10–35)
Alkaline Phosphatase: 127 U/L — ABNORMAL HIGH (ref 40–115)
BILIRUBIN TOTAL: 0.7 mg/dL (ref 0.2–1.2)
BUN: 14 mg/dL (ref 7–25)
CHLORIDE: 105 mmol/L (ref 98–110)
CO2: 23 mmol/L (ref 20–31)
CREATININE: 1.48 mg/dL — AB (ref 0.70–1.25)
Calcium: 8.9 mg/dL (ref 8.6–10.3)
GLUCOSE: 120 mg/dL — AB (ref 65–99)
Potassium: 4 mmol/L (ref 3.5–5.3)
SODIUM: 138 mmol/L (ref 135–146)
Total Protein: 6.5 g/dL (ref 6.1–8.1)

## 2015-05-22 LAB — VITAMIN B12: Vitamin B-12: 284 pg/mL (ref 211–911)

## 2015-05-22 LAB — HM DIABETES EYE EXAM

## 2015-05-23 LAB — VITAMIN D 25 HYDROXY (VIT D DEFICIENCY, FRACTURES): VIT D 25 HYDROXY: 45 ng/mL (ref 30–100)

## 2015-05-23 LAB — PSA: PSA: 4.47 ng/mL — ABNORMAL HIGH (ref <=4.00)

## 2015-05-23 LAB — HEMOGLOBIN A1C
Hgb A1c MFr Bld: 6.1 % — ABNORMAL HIGH (ref <5.7)
MEAN PLASMA GLUCOSE: 128 mg/dL — AB (ref <117)

## 2015-05-26 ENCOUNTER — Other Ambulatory Visit: Payer: Self-pay | Admitting: *Deleted

## 2015-05-26 MED ORDER — ATORVASTATIN CALCIUM 20 MG PO TABS: 20.0000 mg | ORAL_TABLET | Freq: Every day | ORAL | Status: DC

## 2015-05-26 MED ORDER — METFORMIN HCL 1000 MG PO TABS: ORAL_TABLET | ORAL | Status: DC

## 2015-05-26 MED ORDER — GLIMEPIRIDE 2 MG PO TABS: ORAL_TABLET | ORAL | Status: DC

## 2015-05-27 ENCOUNTER — Encounter: Payer: Self-pay | Admitting: Family Medicine

## 2015-05-27 ENCOUNTER — Telehealth: Payer: Self-pay | Admitting: Family Medicine

## 2015-05-27 ENCOUNTER — Ambulatory Visit (INDEPENDENT_AMBULATORY_CARE_PROVIDER_SITE_OTHER): Payer: No Typology Code available for payment source | Admitting: Family Medicine

## 2015-05-27 VITALS — BP 136/78 | HR 80 | Temp 98.1°F | Wt 252.2 lb

## 2015-05-27 DIAGNOSIS — Z23 Encounter for immunization: Secondary | ICD-10-CM | POA: Diagnosis not present

## 2015-05-27 DIAGNOSIS — N183 Chronic kidney disease, stage 3 unspecified: Secondary | ICD-10-CM

## 2015-05-27 DIAGNOSIS — R972 Elevated prostate specific antigen [PSA]: Secondary | ICD-10-CM

## 2015-05-27 DIAGNOSIS — E1122 Type 2 diabetes mellitus with diabetic chronic kidney disease: Secondary | ICD-10-CM | POA: Diagnosis not present

## 2015-05-27 DIAGNOSIS — I10 Essential (primary) hypertension: Secondary | ICD-10-CM | POA: Diagnosis not present

## 2015-05-27 DIAGNOSIS — E1121 Type 2 diabetes mellitus with diabetic nephropathy: Secondary | ICD-10-CM

## 2015-05-27 DIAGNOSIS — E538 Deficiency of other specified B group vitamins: Secondary | ICD-10-CM

## 2015-05-27 DIAGNOSIS — E559 Vitamin D deficiency, unspecified: Secondary | ICD-10-CM

## 2015-05-27 HISTORY — DX: Elevated prostate specific antigen (PSA): R97.20

## 2015-05-27 MED ORDER — TAMSULOSIN HCL 0.4 MG PO CAPS: 0.4000 mg | ORAL_CAPSULE | Freq: Every day | ORAL | Status: DC

## 2015-05-27 MED ORDER — VITAMIN B-12 500 MCG PO TABS: 500.0000 ug | ORAL_TABLET | Freq: Every day | ORAL | Status: DC

## 2015-05-27 MED ORDER — VITAMIN D3 25 MCG (1000 UT) PO CAPS: 1.0000 | ORAL_CAPSULE | Freq: Every day | ORAL | Status: DC

## 2015-05-27 NOTE — Assessment & Plan Note (Signed)
Followed by endo. Continue. Foot exam today. Pt states had stable eye exam last week. Continue current med regimen.

## 2015-05-27 NOTE — Telephone Encounter (Signed)
Spoke with patient.

## 2015-05-27 NOTE — Assessment & Plan Note (Signed)
Vit D now replaced. rec start 1000 IU daily.

## 2015-05-27 NOTE — Assessment & Plan Note (Addendum)
Now followed by nephrology Dr Holley Raring.

## 2015-05-27 NOTE — Patient Instructions (Addendum)
Let's watch dry cough and let me know if bothersome to switch lisinopril to losartan.  Start vitamin D 1000 units daily and B12 570mcg daily - both over the counter. Prostate returned elevated: Lab Results  Component Value Date   PSA 4.47* 05/22/2015   PSA 2.38 05/18/2011   PSA 2.0 07/21/2010  PSA 2.1 (09/2012) PSA 3.8 (07/2014)  Recheck levels in 3 months. If staying elevated we will refer you to urologist. In the meantime, start flomax nightly for possible prostate enlargement. Flu shot today. Return after Sep 03, 2015 for physical.

## 2015-05-27 NOTE — Assessment & Plan Note (Signed)
Low normal B12 along with some fatigue and imbalance - rec start b12 500 mcg daily.

## 2015-05-27 NOTE — Assessment & Plan Note (Signed)
Chronic, stable. Discussed lisinopril vs other meds - addressed his concerns with ACEI in african american population, advised ACEI is indicated in h/o DM.

## 2015-05-27 NOTE — Telephone Encounter (Signed)
Patient returned Kim's call. °

## 2015-05-27 NOTE — Progress Notes (Signed)
Pre visit review using our clinic review tool, if applicable. No additional management support is needed unless otherwise documented below in the visit note. 

## 2015-05-27 NOTE — Progress Notes (Signed)
BP 136/78 mmHg  Pulse 80  Temp(Src) 98.1 F (36.7 C) (Oral)  Wt 252 lb 4 oz (114.42 kg)   CC: 3 mo f/u visit  Subjective:    Patient ID: Kirk Buttery., male    DOB: 25-May-1951, 64 y.o.   MRN: [id number]  HPI: Kirk Stene. is a 64 y.o. male presenting on 05/27/2015 for Follow-up   DM - sees Kirk Bennett. Regularly does check sugars.  Compliant with antihyperglycemic regimen which includes: metformin 1000mg  BID amaryl 2mg  daily and farxiga 5mg  daily.  Denies low sugars or hypoglycemic symptoms.  Denies paresthesias. Last diabetic eye exam 04/2015 per patient.  Pneumovax: 05/2012.  Prevnar: not due yet. Lab Results  Component Value Date   HGBA1C 6.1* 05/22/2015   Diabetic Foot Exam - Simple   Simple Foot Form  Diabetic Foot exam was performed with the following findings:  Yes 05/27/2015  7:46 AM  Visual Inspection  See comments:  Yes  Sensation Testing  Intact to touch and monofilament testing bilaterally:  Yes  Pulse Check  Posterior Tibialis and Dorsalis pulse intact bilaterally:  Yes  Comments  Some peeling of bilateral soles, some maceration between toes on right foot      CKD stage 3 in diabetic - has established with nephrology Kirk Bennett. Compliant with lisinopril 10mg  bid.   Vit D - taking 50,000 IU weekly.   Elevated PSA. Endorses nocturia x1-2 and urinary stream hesitancy. Lab Results  Component Value Date   PSA 4.47* 05/22/2015   PSA 2.38 05/18/2011   PSA 2.0 07/21/2010  PSA 2.1 (09/2012) PSA 3.8 (07/2014)  CPE Due 08/2014.   Relevant past medical, surgical, family and social history reviewed and updated as indicated. Interim medical history since our last visit reviewed. Allergies and medications reviewed and updated. Current Outpatient Prescriptions on File Prior to Visit  Medication Sig  . atorvastatin (LIPITOR) 20 MG tablet Take 1 tablet (20 mg total) by mouth daily.  . Dapagliflozin Propanediol (FARXIGA) 5 MG TABS Take 1 tablet by mouth daily.  Marland Kitchen  glimepiride (AMARYL) 2 MG tablet TAKE 1 TABLET BY MOUTH EVERY DAY BEFORE BREAKFAST  . lamoTRIgine (LAMICTAL) 150 MG tablet Take 1 tablet (150 mg total) by mouth 2 (two) times daily.  Marland Kitchen lisinopril (PRINIVIL,ZESTRIL) 10 MG tablet Take 1 tablet (10 mg total) by mouth 2 (two) times daily.  . metFORMIN (GLUCOPHAGE) 1000 MG tablet TAKE 1 TABLET BY MOUTH TWICE A DAY WITH A MEAL  . ONE TOUCH ULTRA TEST test strip TEST BLOOD SUGAR FASTING IN THE MORNING AND 2 HOURS AFTER LUNCH OR DINNER   No current facility-administered medications on file prior to visit.    Review of Systems Per HPI unless specifically indicated above     Objective:    BP 136/78 mmHg  Pulse 80  Temp(Src) 98.1 F (36.7 C) (Oral)  Wt 252 lb 4 oz (114.42 kg)  Wt Readings from Last 3 Encounters:  05/27/15 252 lb 4 oz (114.42 kg)  03/02/15 255 lb 8 oz (115.894 kg)  11/12/14 255 lb 8 oz (115.894 kg)    Physical Exam  Constitutional: He appears well-developed and well-nourished. No distress.  HENT:  Head: Normocephalic and atraumatic.  Right Ear: External ear normal.  Left Ear: External ear normal.  Nose: Nose normal.  Mouth/Throat: Oropharynx is clear and moist. No oropharyngeal exudate.  Eyes: Conjunctivae and EOM are normal. Pupils are equal, round, and reactive to light. No scleral icterus.  Neck: Normal range  of motion. Neck supple.  Cardiovascular: Normal rate, regular rhythm, normal heart sounds and intact distal pulses.   No murmur heard. Pulmonary/Chest: Effort normal and breath sounds normal. No respiratory distress. He has no wheezes. He has no rales.  Genitourinary: Rectum normal. Rectal exam shows no external hemorrhoid, no internal hemorrhoid, no fissure, no mass, no tenderness and anal tone normal. Prostate is enlarged (20gm). Prostate is not tender.  Musculoskeletal: He exhibits no edema.  See HPI for foot exam if done  Lymphadenopathy:    He has no cervical adenopathy.  Skin: Skin is warm and dry. No  rash noted.  Psychiatric: He has a normal mood and affect.  Nursing note and vitals reviewed.  Results for orders placed or performed in visit on 05/27/15  HM DIABETES EYE EXAM  Result Value Ref Range   HM Diabetic Eye Exam No Retinopathy No Retinopathy      Assessment & Plan:   Problem List Items Addressed This Visit    Vitamin D deficiency    Vit D now replaced. rec start 1000 IU daily.      Low vitamin B12 level    Low normal B12 along with some fatigue and imbalance - rec start b12 500 mcg daily.      Essential hypertension, benign    Chronic, stable. Discussed lisinopril vs other meds - addressed his concerns with ACEI in african american population, advised ACEI is indicated in h/o DM.       Elevated PSA    Discussed causes with patient including BPH and cancer, discussed options - more frequent monitoring vs urology referral. Pt opts for repeat PSA level in 3 months. Endorses some BPH symptoms - start flomax nightly. Reassess in 3 months with free PSA level.      Controlled type 2 diabetes mellitus with diabetic nephropathy ([place]) - Primary    Followed by endo. Continue. Foot exam today. Pt states had stable eye exam last week. Continue current med regimen.      CKD stage 3 due to type 2 diabetes mellitus [facility])    Now followed by nephrology Kirk Bennett.       Other Visit Diagnoses    Need for influenza vaccination        Relevant Orders    Flu Vaccine QUAD 36+ mos PF IM (Fluarix & Fluzone Quad PF)        Follow up plan: Return in about 3 months (around 09/03/2015), or as needed, for annual exam, prior fasting for blood work.

## 2015-05-27 NOTE — Assessment & Plan Note (Addendum)
Discussed causes with patient including BPH and cancer, discussed options - more frequent monitoring vs urology referral. Pt opts for repeat PSA level in 3 months. Endorses some BPH symptoms - start flomax nightly. Reassess in 3 months with free PSA level.

## 2015-06-01 ENCOUNTER — Other Ambulatory Visit: Payer: Self-pay | Admitting: *Deleted

## 2015-06-01 MED ORDER — LISINOPRIL 10 MG PO TABS
10.0000 mg | ORAL_TABLET | Freq: Two times a day (BID) | ORAL | Status: DC
Start: 2015-06-01 — End: 2016-06-19

## 2015-06-10 ENCOUNTER — Telehealth: Payer: Self-pay | Admitting: Family Medicine

## 2015-06-10 NOTE — Telephone Encounter (Signed)
Pt walked in with bill from [facility] and requested to speak with manager regarding $8.21 bill.  States his labs are 100% paid from [facility] [facility].  Checked with lab, [facility] arre owned jointly.  Called pt back,states he called lab and insurance and they said was coded incorrectly.  He will check with insurance after they resubmit and will call us back if we need to make any coding adjustments.

## 2015-06-25 ENCOUNTER — Telehealth: Payer: Self-pay | Admitting: Family Medicine

## 2015-06-25 NOTE — Telephone Encounter (Signed)
Patient notified and verbalized understanding. 

## 2015-06-25 NOTE — Telephone Encounter (Signed)
Spoke with patient. He said he is constipated. No BM x 5 days except one very small one. Able to pass gas. No bloating, distention or pain. He has been taking Metamucil x 3 days with no relief. He normally goes 2-3 daily and he is very uncomfortable. What do you suggest?

## 2015-06-25 NOTE — Telephone Encounter (Signed)
Pt would like you to call him about some issues he is having with his stomach.  He didn't want to tell me what it was about and he refused to talk to [facility].  He only wanted to talk to [place]

## 2015-06-25 NOTE — Telephone Encounter (Signed)
Continue 1 capful miralax with large glass of fluid, may even try 1 capful twice daily. Would add on milk of magnesia 1 tablespoon twice daily as needed. Make sure drinking plenty of water. Update [place] tomorrow with effect.

## 2015-06-26 NOTE — Telephone Encounter (Addendum)
Please call him back.  I would continue the MOM and miralax with large glass of fluid twice daily.  That should eventually help.  If the absence of emergent sx, I would give this more time.   If he isn't passing gas at all, then he needs eval and likely disimpaction.

## 2015-06-26 NOTE — Telephone Encounter (Signed)
Could you offer anymore advice for him in Dr. Synthia Innocent absence?

## 2015-06-26 NOTE — Telephone Encounter (Signed)
Patient notified. He said he is still able to pass gas. Advised him to try to increase fruits and veggies for fiber and increase water intake as well. Advised if he gets to the point where he stops passing gas to go to [facility] or ER over the weekend. He verbalized understanding. He will call me Monday with an update.

## 2015-06-26 NOTE — Telephone Encounter (Signed)
Patient said the plan he was given yesterday isn't working and he wants to know what he should do.  Please call patient back at [phone].

## 2015-07-09 ENCOUNTER — Emergency Department: Payer: 59

## 2015-07-09 ENCOUNTER — Encounter: Payer: Self-pay | Admitting: Emergency Medicine

## 2015-07-09 ENCOUNTER — Telehealth: Payer: Self-pay | Admitting: *Deleted

## 2015-07-09 ENCOUNTER — Emergency Department
Admission: EM | Admit: 2015-07-09 | Discharge: 2015-07-09 | Disposition: A | Discharge status: Home / Self Care | Payer: 59 | Attending: Emergency Medicine | Admitting: Emergency Medicine

## 2015-07-09 DIAGNOSIS — E1122 Type 2 diabetes mellitus with diabetic chronic kidney disease: Secondary | ICD-10-CM | POA: Insufficient documentation

## 2015-07-09 DIAGNOSIS — N183 Chronic kidney disease, stage 3 (moderate): Secondary | ICD-10-CM | POA: Insufficient documentation

## 2015-07-09 DIAGNOSIS — K310 Acute dilatation of stomach: Secondary | ICD-10-CM | POA: Insufficient documentation

## 2015-07-09 DIAGNOSIS — K5900 Constipation, unspecified: Secondary | ICD-10-CM | POA: Insufficient documentation

## 2015-07-09 DIAGNOSIS — I129 Hypertensive chronic kidney disease with stage 1 through stage 4 chronic kidney disease, or unspecified chronic kidney disease: Secondary | ICD-10-CM | POA: Insufficient documentation

## 2015-07-09 DIAGNOSIS — Z79899 Other long term (current) drug therapy: Secondary | ICD-10-CM | POA: Diagnosis not present

## 2015-07-09 DIAGNOSIS — N189 Chronic kidney disease, unspecified: Secondary | ICD-10-CM

## 2015-07-09 LAB — COMPREHENSIVE METABOLIC PANEL
ALBUMIN: 3.8 g/dL (ref 3.5–5.0)
ALK PHOS: 128 U/L — AB (ref 38–126)
ALT: 14 U/L — AB (ref 17–63)
AST: 19 U/L (ref 15–41)
Anion gap: 8 (ref 5–15)
BILIRUBIN TOTAL: 0.8 mg/dL (ref 0.3–1.2)
BUN: 17 mg/dL (ref 6–20)
CALCIUM: 9.4 mg/dL (ref 8.9–10.3)
CO2: 24 mmol/L (ref 22–32)
CREATININE: 1.47 mg/dL — AB (ref 0.61–1.24)
Chloride: 106 mmol/L (ref 101–111)
GFR calc Af Amer: 57 mL/min — ABNORMAL LOW (ref >60)
GFR calc non Af Amer: 49 mL/min — ABNORMAL LOW (ref >60)
GLUCOSE: 135 mg/dL — AB (ref 65–99)
Potassium: 4 mmol/L (ref 3.5–5.1)
Sodium: 138 mmol/L (ref 135–145)
TOTAL PROTEIN: 7.4 g/dL (ref 6.5–8.1)

## 2015-07-09 LAB — CBC
HCT: 43.5 % (ref 40.0–52.0)
Hemoglobin: 14 g/dL (ref 13.0–18.0)
MCH: 27 pg (ref 26.0–34.0)
MCHC: 32.2 g/dL (ref 32.0–36.0)
MCV: 83.8 fL (ref 80.0–100.0)
Platelets: 206 10*3/uL (ref 150–440)
RBC: 5.19 MIL/uL (ref 4.40–5.90)
RDW: 12.4 % (ref 11.5–14.5)
WBC: 6.3 10*3/uL (ref 3.8–10.6)

## 2015-07-09 LAB — LIPASE, BLOOD: Lipase: 59 U/L — ABNORMAL HIGH (ref 11–51)

## 2015-07-09 MED ORDER — IOHEXOL 300 MG/ML  SOLN
100.0000 mL | Freq: Once | INTRAMUSCULAR | Status: AC | PRN
  Administered 2015-07-09: 100 mL via INTRAVENOUS
  Filled 2015-07-09: qty 100

## 2015-07-09 MED ORDER — SENNOSIDES-DOCUSATE SODIUM 8.6-50 MG PO TABS: 1.0000 | ORAL_TABLET | Freq: Every day | ORAL | Status: DC

## 2015-07-09 MED ORDER — LACTULOSE 10 GM/15ML PO SOLN
30.0000 g | Freq: Once | ORAL | Status: AC
  Administered 2015-07-09: 30 g via ORAL
  Filled 2015-07-09: qty 30

## 2015-07-09 MED ORDER — IOHEXOL 240 MG/ML SOLN
25.0000 mL | Freq: Once | INTRAMUSCULAR | Status: AC | PRN
  Administered 2015-07-09: 25 mL via ORAL
  Filled 2015-07-09: qty 25

## 2015-07-09 MED ORDER — LACTULOSE 10 GM/15ML PO SOLN
ORAL | Status: AC
  Administered 2015-07-09: 30 g via ORAL
  Filled 2015-07-09: qty 30

## 2015-07-09 MED ORDER — FLEET ENEMA 7-19 GM/118ML RE ENEM
1.0000 | ENEMA | Freq: Once | RECTAL | Status: DC
Start: 2015-07-09 — End: 2015-07-09

## 2015-07-09 NOTE — ED Notes (Signed)
C/O 10 day history of constipation.  Has taken miralax and Milk of Magnesia with good results one week ago.  States last BM Monday or Tuesday, but only small amounts.  States took both miralax and Milk of Magnesia one hour ago.

## 2015-07-09 NOTE — ED Provider Notes (Signed)
[facility] Emergency Department Provider Note  ____________________________________________  Time seen: Approximately 2:12 PM  I have reviewed the triage vital signs and the nursing notes.   HISTORY  Chief Complaint Constipation    HPI Kirk Bennett. is a 64 y.o. male with a history of low fiber diet and constipation in the past presenting with constipation for 3 days. Patient reports that he has not had a bowel movement over the past 3 days, but does continue to pass gas. He denies any nausea or vomiting, abdominal pain, fever. He does report distention.  He had similar symptoms last week and was seen by his primary care physician where he was successfully treated with milk of magnesia and MiraLAX. He tried both of those medications at 11 AM today without any results.Last colonoscopy was greater than 6 years ago and was reportedly normal.   Past Medical History  Diagnosis Date  . 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 Gabriel Carina, endo  . HTN (hypertension)   . HLD (hyperlipidemia)   . 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 Mora Bellman at [facility])  . Knee pain     s/p replacement  . History of hepatitis A   . SVT (supraventricular tachycardia) ([place]) 1996  . Cataracts, bilateral 02/2011    and suspected glaucoma, to return for f/u, no diabetic retinopathy  . Depression     found by neuro  . Glaucoma 2015  . CKD stage 3 due to type 2 diabetes mellitus ([place]) 2015    normal renal US, self referred to Dr Holley Raring  . Vitamin D deficiency 03/02/2015    Patient Active Problem List   Diagnosis Date Noted  . Low vitamin B12 level 05/27/2015  . Elevated PSA 05/27/2015  . Unsteadiness on feet 03/02/2015  . Vitamin D deficiency 03/02/2015  . Urinary frequency 11/07/2014  . CKD stage 3 due to type 2 diabetes  mellitus ([place])   . Incomplete emptying of bladder 04/23/2014  . Healthcare maintenance 04/26/2011  . TOTAL KNEE REPLACEMENT, LEFT, HX OF 04/07/2010  . Controlled type 2 diabetes mellitus with diabetic nephropathy ([place]) 03/30/2010  . Hyperlipidemia 03/30/2010  . 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 Essential hypertension, benign 03/30/2010    Past Surgical History  Procedure Laterality Date  . Corrective surgery amblyopia  1957  . Left knee surgery  1971    Torn ACL  . Cystectomy or meningioma removal brain  1990    (unclear)  . Replacement total knee  April 2011    Left [facility] Dr. Garald Balding)  . Cardiac catheterization  July 2010    No blockages (Dr. Liliane Shi)  . Colonoscopy  09/16/2005    hyperplastic polyps, rpt due 10 yrs     Current Outpatient Rx  Name  Route  Sig  Dispense  Refill  . atorvastatin (LIPITOR) 20 MG tablet   Oral   Take 1 tablet (20 mg total) by mouth daily.   90 tablet   3   . Cholecalciferol (VITAMIN D3) 1000 UNITS CAPS   Oral   Take 1 capsule (1,000 Units total) by mouth daily.   30 capsule      . Dapagliflozin Propanediol (FARXIGA) 5 MG TABS   Oral   Take 1 tablet by mouth daily.         Marland Kitchen  glimepiride (AMARYL) 2 MG tablet      TAKE 1 TABLET BY MOUTH EVERY DAY BEFORE BREAKFAST   90 tablet   3   . lamoTRIgine (LAMICTAL) 150 MG tablet   Oral   Take 1 tablet (150 mg total) by mouth 2 (two) times daily.         . LevETIRAcetam (KEPPRA PO)   Oral   Take by mouth as directed. 1500 mg AM and 1000 mg PM         . lisinopril (PRINIVIL,ZESTRIL) 10 MG tablet   Oral   Take 1 tablet (10 mg total) by mouth 2 (two) times daily.   180 tablet   3   . metFORMIN (GLUCOPHAGE) 1000 MG tablet      TAKE 1 TABLET BY MOUTH TWICE A DAY WITH A MEAL   180 tablet   3   . ONE TOUCH ULTRA TEST test strip      TEST BLOOD SUGAR FASTING IN THE MORNING AND 2 HOURS AFTER LUNCH OR DINNER   100 each   3    . senna-docusate (SENOKOT-S) 8.6-50 MG tablet   Oral   Take 1 tablet by mouth daily.   30 tablet   0   . tamsulosin (FLOMAX) 0.4 MG CAPS capsule   Oral   Take 1 capsule (0.4 mg total) by mouth daily.   30 capsule   3   . vitamin B-12 (CYANOCOBALAMIN) 500 MCG tablet   Oral   Take 1 tablet (500 mcg total) by mouth daily.           Allergies Review of patient's allergies indicates no known allergies.  Family History  Problem Relation Age of Onset  . Heart failure Father   . Cancer Mother     Lung, smoker  . Aortic dissection Mother     Aortic rupture  . Mental illness Sister     43 years old  . Diabetes Maternal Aunt   . Diabetes Cousin   . Cancer Cousin     Colon age 65  . CAD Neg Hx     Social History Social History  Substance Use Topics  . Smoking status: Never Smoker   . Smokeless tobacco: Never Used  . Alcohol Use: No    Review of Systems Constitutional: No fever/chills Eyes: No visual changes. ENT: No sore throat. Cardiovascular: Denies chest pain, palpitations. Respiratory: Denies shortness of breath.  No cough. Gastrointestinal: No abdominal pain.  No nausea, no vomiting.  No diarrhea.  Positive constipation. Positive flatulence. Positive distention. Genitourinary: Negative for dysuria. Musculoskeletal: Negative for back pain. Skin: Negative for rash. Neurological: Negative for headaches, focal weakness or numbness.  10-point ROS otherwise negative.  ____________________________________________   PHYSICAL EXAM:  VITAL SIGNS: ED Triage Vitals  Enc Vitals Group     BP 07/09/15 1256 136/74 mmHg     Pulse Rate 07/09/15 1256 84     Resp 07/09/15 1256 16     Temp 07/09/15 1256 97.7 F (36.5 C)     Temp Source 07/09/15 1256 Oral     SpO2 07/09/15 1256 98 %     Weight 07/09/15 1256 244 lb (110.678 kg)     Height 07/09/15 1256 6\' 3"  (1.905 m)     Head Cir --      Peak Flow --      Pain Score 07/09/15 1258 0     Pain Loc --      Pain Edu?  --  Excl. in [place]? --    Constitutional: Alert and oriented. Well appearing and in no acute distress. Answer question appropriately. Eyes: Conjunctivae are normal.  EOMI. no scleral icterus Head: Atraumatic. Nose: No congestion/rhinnorhea. Mouth/Throat: Mucous membranes are moist.  Neck: No stridor.  Supple.   Cardiovascular: Normal rate, regular rhythm. No murmurs, rubs or gallops.  Respiratory: Normal respiratory effort.  No retractions. Lungs CTAB.  No wheezes, rales or ronchi. Gastrointestinal: Abdomen is soft and mildly distended without a fluid wave. No tenderness to palpation. No guarding, rebound, or peritoneal signs. Musculoskeletal: No LE edema.  Neurologic:  Normal speech and language. No gross focal neurologic deficits are appreciated.  Skin:  Skin is warm, dry and intact. No rash noted. Psychiatric: Mood and affect are normal. Speech and behavior are normal.  Normal judgement.  ____________________________________________   LABS (all labs ordered are listed, but only abnormal results are displayed)  Labs Reviewed  LIPASE, BLOOD - Abnormal; Notable for the following:    Lipase 59 (*)    All other components within normal limits  COMPREHENSIVE METABOLIC PANEL - Abnormal; Notable for the following:    Glucose, Bld 135 (*)    Creatinine, Ser 1.47 (*)    ALT 14 (*)    Alkaline Phosphatase 128 (*)    GFR calc non Af Amer 49 (*)    GFR calc Af Amer 57 (*)    All other components within normal limits  CBC  URINALYSIS COMPLETEWITH MICROSCOPIC ([facility] ONLY)   ____________________________________________  EKG  Not indicated ____________________________________________  RADIOLOGY  Ct Abdomen Pelvis W Contrast  07/09/2015  CLINICAL DATA:  C/O 10 day history of constipation. Has taken miralax and Milk of Magnesia with good results one week ago. States last BM Monday or Tuesday, but only small amounts. States took both miralax and Milk of Magnesia one hour ago. EXAM: CT  ABDOMEN AND PELVIS WITH CONTRAST TECHNIQUE: Multidetector CT imaging of the abdomen and pelvis was performed using the standard protocol following bolus administration of intravenous contrast. CONTRAST:  156mL OMNIPAQUE IOHEXOL 300 MG/ML  SOLN COMPARISON:  None. FINDINGS: Visualized lung bases clear. Unremarkable liver, nondilated gallbladder, spleen, adrenal glands, kidneys, pancreas, aorta, portal vein. No hydronephrosis. Stomach is incompletely distended. Small bowel nondilated. Normal appendix. Colon is nondistended. No evidence of obstruction. Urinary bladder physiologically distended. Moderate prostatic enlargement. No ascites.  No free air.  No adenopathy. Lumbar degenerative disc disease L3-S1.  Advanced facet DJD L3-4. IMPRESSION: 1. No acute abdominal process.  No evidence of obstruction or ileus. Electronically Signed   By: Lucrezia Europe M.D.   On: 07/09/2015 16:36   Dg Abd Acute W/chest  07/09/2015  CLINICAL DATA:  Constipation for 10 days. EXAM: DG ABDOMEN ACUTE W/ 1V CHEST COMPARISON:  Two-view chest x-ray 01/20/2012 FINDINGS: The heart size is normal.  The lungs are clear. Moderate stool is present throughout the distal colon. There fluid levels in the ascending colon suggesting ileus or partial obstruction. There is no free air. IMPRESSION: 1. Fluid levels in the ascending colon compatible with partial obstruction or ileus. No significant small bowel distention is present 2. No significant free air. 3. No acute cardiopulmonary disease. Electronically Signed   By: San Morelle M.D.   On: 07/09/2015 14:37    ____________________________________________   PROCEDURES  Procedure(s) performed: None  Critical Care performed: No ____________________________________________   INITIAL IMPRESSION / ASSESSMENT AND PLAN / ED COURSE  Pertinent labs & imaging results that were available during my care of the patient  were reviewed by me and considered in my medical decision making (see chart  for details).  64 y.o. male w/ 2 wks of intermittent constipation presenting w/ no BM x 3d, pos abd distention.  Will plan to treat for constipation, but given distention even though he has a native abdomen, will get abdominal xray.  ----------------------------------------- 3:14 PM on 07/09/2015 -----------------------------------------  The patient's abdominal x-ray shows some air-fluid levels that may can be consistent with partial small bowel obstruction or ileus.  Will get a CT scan for further evaluation.  At this time, the patient continues to be pain-free and has had 3 liquid nonbloody bowel movement since arriving.  ----------------------------------------- 4:50 PM on 07/09/2015 -----------------------------------------  The patient CT does not show any evidence of obstruction. The patient has continued to have bowel movements and states that he is feeling much better and that his abdomen is significantly less distended. He continues to have stable vital signs, so I'll plan to discharge him home with close PMD follow-up. He understands return precautions and follow-up instructions.  ____________________________________________  FINAL CLINICAL IMPRESSION(S) / ED DIAGNOSES  Final diagnoses:  Chronic renal insufficiency, unspecified stage  Constipation, unspecified constipation type      NEW MEDICATIONS STARTED DURING THIS VISIT:  New Prescriptions   SENNA-DOCUSATE (SENOKOT-S) 8.6-50 MG TABLET    Take 1 tablet by mouth daily.     Eula Listen, MD 07/09/15 1651

## 2015-07-09 NOTE — Telephone Encounter (Signed)
Patient called and said that the miralax/MOM combo worked x 3 days and now he is unable to pass gas and hasn't had a BM x 3 days. He said he is extremely uncomfortable and ready to go to the ER. Spoke with Dr. Darnell Level for recommendation and he said that if patient was that uncomfortable and not passing gas, then he agreed he needed to go to ER. Patient advised and said he was going to [facility].

## 2015-07-09 NOTE — Discharge Instructions (Signed)
Please follow up high fiber diet as indicated in the paperwork. Please start a daily stool softener. Please return to the emergency department if you develop severe abdominal pain, inability to keep down fluids, bloating, fever, or any other symptoms concerning to you.

## 2015-07-13 ENCOUNTER — Ambulatory Visit (INDEPENDENT_AMBULATORY_CARE_PROVIDER_SITE_OTHER): Payer: No Typology Code available for payment source | Admitting: Family Medicine

## 2015-07-13 ENCOUNTER — Encounter: Payer: Self-pay | Admitting: Family Medicine

## 2015-07-13 VITALS — BP 118/62 | HR 88 | Temp 97.5°F | Wt 241.2 lb

## 2015-07-13 DIAGNOSIS — M79675 Pain in left toe(s): Secondary | ICD-10-CM | POA: Diagnosis not present

## 2015-07-13 DIAGNOSIS — K5901 Slow transit constipation: Secondary | ICD-10-CM

## 2015-07-13 DIAGNOSIS — K5900 Constipation, unspecified: Secondary | ICD-10-CM | POA: Insufficient documentation

## 2015-07-13 NOTE — Assessment & Plan Note (Signed)
Discussed recent ER visit with patient as well as recommended bowel regimen. Increase water, fiber, and start soluble fiber supplement daily.  Discussed sennakot, miralax, and MOM use.  Update if recurrent concerns.

## 2015-07-13 NOTE — Assessment & Plan Note (Signed)
Not true pain but more swelling/pressure sensation.  ?related to diabetic neuropathy although diabetic control has been very good to date. Rec change B12 to B complex vitamin. Update with effect.

## 2015-07-13 NOTE — Progress Notes (Signed)
Pre visit review using our clinic review tool, if applicable. No additional management support is needed unless otherwise documented below in the visit note. 

## 2015-07-13 NOTE — Progress Notes (Signed)
BP 118/62 mmHg  Pulse 88  Temp(Src) 97.5 F (36.4 C) (Oral)  Wt 241 lb 4 oz (109.43 kg)   CC: f/u constipation  Subjective:    Patient ID: Kirk Buttery., male    DOB: Feb 21, 1951, 64 y.o.   MRN: [id number]  HPI: Kirk Bennett. is a 64 y.o. male presenting on 07/13/2015 for Constipation   Constipation ongoing since beginning of November despite regular metamucil. Initially treated with miralax BID and milk of magnesia BID which resolved sxs for a few days, then sxs recurred 11/17. Pt endorsed abdominal discomfort and inability to pass gas so referred to ER. Records reviewed:   Abd xray concern for partial obstruction due to fluid levels in ascending colon, CT abd with contrast - normal without obstruction/ileus. Discharged with senna-docusate 1 tab daily.   Miralax ultimately didn't really help. Denies recent diet changes, never blood in stool, never abd pain. Feeling markedly better  Worried flomax may be causing constipation but it is not listed as a possible side effect on [facility].   COLONOSCOPY Date: 09/16/2005 hyperplastic polyps, rpt due 10 yrs   Losing weight - stopped fatty foods. Lost 10 lbs in the last month.  Left great toe discomfort - pressure and swelling. Started a few days ago. No numbness or pain.   Relevant past medical, surgical, family and social history reviewed and updated as indicated. Interim medical history since our last visit reviewed. Allergies and medications reviewed and updated. Current Outpatient Prescriptions on File Prior to Visit  Medication Sig  . atorvastatin (LIPITOR) 20 MG tablet Take 1 tablet (20 mg total) by mouth daily.  . Cholecalciferol (VITAMIN D3) 1000 UNITS CAPS Take 1 capsule (1,000 Units total) by mouth daily.  . Dapagliflozin Propanediol (FARXIGA) 5 MG TABS Take 1 tablet by mouth daily.  Marland Kitchen glimepiride (AMARYL) 2 MG tablet TAKE 1 TABLET BY MOUTH EVERY DAY BEFORE BREAKFAST  . lamoTRIgine (LAMICTAL) 150 MG tablet Take 1 tablet  (150 mg total) by mouth 2 (two) times daily.  . LevETIRAcetam (KEPPRA PO) Take by mouth as directed. 1500 mg AM and 1000 mg PM  . lisinopril (PRINIVIL,ZESTRIL) 10 MG tablet Take 1 tablet (10 mg total) by mouth 2 (two) times daily.  . metFORMIN (GLUCOPHAGE) 1000 MG tablet TAKE 1 TABLET BY MOUTH TWICE A DAY WITH A MEAL  . ONE TOUCH ULTRA TEST test strip TEST BLOOD SUGAR FASTING IN THE MORNING AND 2 HOURS AFTER LUNCH OR DINNER  . senna-docusate (SENOKOT-S) 8.6-50 MG tablet Take 1 tablet by mouth daily.  . tamsulosin (FLOMAX) 0.4 MG CAPS capsule Take 1 capsule (0.4 mg total) by mouth daily. (Patient not taking: Reported on 07/13/2015)   No current facility-administered medications on file prior to visit.    Review of Systems Per HPI unless specifically indicated in ROS section     Objective:    BP 118/62 mmHg  Pulse 88  Temp(Src) 97.5 F (36.4 C) (Oral)  Wt 241 lb 4 oz (109.43 kg)  Wt Readings from Last 3 Encounters:  07/13/15 241 lb 4 oz (109.43 kg)  07/09/15 244 lb (110.678 kg)  05/27/15 252 lb 4 oz (114.42 kg)    Physical Exam  Constitutional: He appears well-developed and well-nourished. No distress.  Abdominal: Soft. Bowel sounds are normal. He exhibits distension (slight). He exhibits no mass. There is no hepatosplenomegaly. There is no tenderness. There is no rigidity, no rebound, no guarding, no CVA tenderness and negative Murphy's sign.  Musculoskeletal: He exhibits  no edema.  Slightly diminished sensation at L foot.  2+ DP No erythema, fluctuance or induration. FROM at great toe joint without pain with axial loading Thickened onychomycotic nails throughout  Nursing note and vitals reviewed.  Results for orders placed or performed during the hospital encounter of 07/09/15  Lipase, blood  Result Value Ref Range   Lipase 59 (H) 11 - 51 U/L  Comprehensive metabolic panel  Result Value Ref Range   Sodium 138 135 - 145 mmol/L   Potassium 4.0 3.5 - 5.1 mmol/L   Chloride  106 101 - 111 mmol/L   CO2 24 22 - 32 mmol/L   Glucose, Bld 135 (H) 65 - 99 mg/dL   BUN 17 6 - 20 mg/dL   Creatinine, Ser 1.47 (H) 0.61 - 1.24 mg/dL   Calcium 9.4 8.9 - 10.3 mg/dL   Total Protein 7.4 6.5 - 8.1 g/dL   Albumin 3.8 3.5 - 5.0 g/dL   AST 19 15 - 41 U/L   ALT 14 (L) 17 - 63 U/L   Alkaline Phosphatase 128 (H) 38 - 126 U/L   Total Bilirubin 0.8 0.3 - 1.2 mg/dL   GFR calc non Af Amer 49 (L) >60 mL/min   GFR calc Af Amer 57 (L) >60 mL/min   Anion gap 8 5 - 15  CBC  Result Value Ref Range   WBC 6.3 3.8 - 10.6 K/uL   RBC 5.19 4.40 - 5.90 MIL/uL   Hemoglobin 14.0 13.0 - 18.0 g/dL   HCT 43.5 40.0 - 52.0 %   MCV 83.8 80.0 - 100.0 fL   MCH 27.0 26.0 - 34.0 pg   MCHC 32.2 32.0 - 36.0 g/dL   RDW 12.4 11.5 - 14.5 %   Platelets 206 150 - 440 K/uL      Assessment & Plan:   Problem List Items Addressed This Visit    Toe pain, left    Not true pain but more swelling/pressure sensation.  ?related to diabetic neuropathy although diabetic control has been very good to date. Rec change B12 to B complex vitamin. Update with effect.      Constipation - Primary    Discussed recent ER visit with patient as well as recommended bowel regimen. Increase water, fiber, and start soluble fiber supplement daily.  Discussed sennakot, miralax, and MOM use.  Update if recurrent concerns.          Follow up plan: Return as needed.

## 2015-07-13 NOTE — Patient Instructions (Signed)
Increase water intake - 32-48 oz per day.  Increase fiber in diet. Fiber handout provided today. Start metamucil or benefiber (soluble fiber supplements) 1 capful in glass of water daily.  May use other bowel medicines as needed (sennakot, miralax, milk of magnesia).  If diarrhea, hold above treatments.  Constipation, Adult Constipation is when a person has fewer than three bowel movements a week, has difficulty having a bowel movement, or has stools that are dry, hard, or larger than normal. As people grow older, constipation is more common. A low-fiber diet, not taking in enough fluids, and taking certain medicines may make constipation worse.  CAUSES   Certain medicines, such as antidepressants, pain medicine, iron supplements, antacids, and water pills.   Certain diseases, such as diabetes, irritable bowel syndrome (IBS), thyroid disease, or depression.   Not drinking enough water.   Not eating enough fiber-rich foods.   Stress or travel.   Lack of physical activity or exercise.   Ignoring the urge to have a bowel movement.   Using laxatives too much.  SIGNS AND SYMPTOMS   Having fewer than three bowel movements a week.   Straining to have a bowel movement.   Having stools that are hard, dry, or larger than normal.   Feeling full or bloated.   Pain in the lower abdomen.   Not feeling relief after having a bowel movement.  DIAGNOSIS  Your health care provider will take a medical history and perform a physical exam. Further testing may be done for severe constipation. Some tests may include:  A barium enema X-ray to examine your rectum, colon, and, sometimes, your small intestine.   A sigmoidoscopy to examine your lower colon.   A colonoscopy to examine your entire colon. TREATMENT  Treatment will depend on the severity of your constipation and what is causing it. Some dietary treatments include drinking more fluids and eating more fiber-rich foods.  Lifestyle treatments may include regular exercise. If these diet and lifestyle recommendations do not help, your health care provider may recommend taking over-the-counter laxative medicines to help you have bowel movements. Prescription medicines may be prescribed if over-the-counter medicines do not work.  HOME CARE INSTRUCTIONS   Eat foods that have a lot of fiber, such as fruits, vegetables, whole grains, and beans.  Limit foods high in fat and processed sugars, such as french fries, hamburgers, cookies, candies, and soda.   A fiber supplement may be added to your diet if you cannot get enough fiber from foods.   Drink enough fluids to keep your urine clear or pale yellow.   Exercise regularly or as directed by your health care provider.   Go to the restroom when you have the urge to go. Do not hold it.   Only take over-the-counter or prescription medicines as directed by your health care provider. Do not take other medicines for constipation without talking to your health care provider first.  [place] IF:   You have bright red blood in your stool.   Your constipation lasts for more than 4 days or gets worse.   You have abdominal or rectal pain.   You have thin, pencil-like stools.   You have unexplained weight loss. MAKE SURE YOU:   Understand these instructions.  Will watch your condition.  Will get help right away if you are not doing well or get worse.   This information is not intended to replace advice given to you by your health care provider. Make  sure you discuss any questions you have with your health care provider.   Document Released: 05/06/2004 Document Revised: 08/29/2014 Document Reviewed: 05/20/2013 Elsevier Interactive Patient Education [employer].

## 2015-07-21 ENCOUNTER — Encounter: Payer: Self-pay | Admitting: Sports Medicine

## 2015-07-21 ENCOUNTER — Ambulatory Visit (INDEPENDENT_AMBULATORY_CARE_PROVIDER_SITE_OTHER): Payer: 59 | Admitting: Sports Medicine

## 2015-07-21 DIAGNOSIS — E1142 Type 2 diabetes mellitus with diabetic polyneuropathy: Secondary | ICD-10-CM | POA: Diagnosis not present

## 2015-07-21 DIAGNOSIS — M79672 Pain in left foot: Secondary | ICD-10-CM | POA: Diagnosis not present

## 2015-07-21 DIAGNOSIS — M79671 Pain in right foot: Secondary | ICD-10-CM

## 2015-07-21 DIAGNOSIS — B351 Tinea unguium: Secondary | ICD-10-CM | POA: Diagnosis not present

## 2015-07-21 NOTE — Progress Notes (Signed)
Patient ID: Kirk Bennett., male   DOB: Apr 10, 1951, 64 y.o.   MRN: [id number] Subjective: Kirk Bennett. is a 64 y.o. male patient with history of type 2 diabetes who presents to office today complaining of long, painful nails  while ambulating in shoes; unable to trim. Patient states that the glucose reading this morning was 120 mg/dl. Patient denies any new changes in medication or new problems. Patient denies any new cramping, numbness, burning or tingling in the legs. Admits to unchanged tingling in both big toes x 3-4 months. No other pedal complaints noted.   Patient Active Problem List   Diagnosis Date Noted  . Constipation 07/13/2015  . Toe pain, left 07/13/2015  . Low vitamin B12 level 05/27/2015  . Elevated PSA 05/27/2015  . Unsteadiness on feet 03/02/2015  . Vitamin D deficiency 03/02/2015  . Urinary frequency 11/07/2014  . CKD stage 3 due to type 2 diabetes mellitus ([place])   . Incomplete emptying of bladder 04/23/2014  . Healthcare maintenance 04/26/2011  . TOTAL KNEE REPLACEMENT, LEFT, HX OF 04/07/2010  . Controlled type 2 diabetes mellitus with diabetic nephropathy ([place]) 03/30/2010  . Hyperlipidemia 03/30/2010  . 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 Essential hypertension, benign 03/30/2010   Current Outpatient Prescriptions on File Prior to Visit  Medication Sig Dispense Refill  . atorvastatin (LIPITOR) 20 MG tablet Take 1 tablet (20 mg total) by mouth daily. 90 tablet 3  . b complex vitamins tablet Take 1 tablet by mouth daily.    . Cholecalciferol (VITAMIN D3) 1000 UNITS CAPS Take 1 capsule (1,000 Units total) by mouth daily. 30 capsule   . Dapagliflozin Propanediol (FARXIGA) 5 MG TABS Take 1 tablet by mouth daily.    Marland Kitchen glimepiride (AMARYL) 2 MG tablet TAKE 1 TABLET BY MOUTH EVERY DAY BEFORE BREAKFAST 90 tablet 3  . lamoTRIgine (LAMICTAL) 150 MG tablet Take 1 tablet (150 mg total) by mouth 2 (two) times daily.    .  LevETIRAcetam (KEPPRA PO) Take by mouth as directed. 1500 mg AM and 1000 mg PM    . lisinopril (PRINIVIL,ZESTRIL) 10 MG tablet Take 1 tablet (10 mg total) by mouth 2 (two) times daily. 180 tablet 3  . metFORMIN (GLUCOPHAGE) 1000 MG tablet TAKE 1 TABLET BY MOUTH TWICE A DAY WITH A MEAL 180 tablet 3  . ONE TOUCH ULTRA TEST test strip TEST BLOOD SUGAR FASTING IN THE MORNING AND 2 HOURS AFTER LUNCH OR DINNER 100 each 3  . senna-docusate (SENOKOT-S) 8.6-50 MG tablet Take 1 tablet by mouth daily. 30 tablet 0  . tamsulosin (FLOMAX) 0.4 MG CAPS capsule Take 1 capsule (0.4 mg total) by mouth daily. (Patient not taking: Reported on 07/13/2015) 30 capsule 3   No current facility-administered medications on file prior to visit.   No Known Allergies   Labs: HEMOGLOBIN A1C 6.1 (04/2015)   Objective: General: Patient is awake, alert, and oriented x 3 and in no acute distress.  Integument: Skin is warm, dry and supple bilateral. Nails are tender, long, thickened and  dystrophic with subungual debris, consistent with onychomycosis, 1-5 bilateral. No signs of infection. No open lesions or preulcerative lesions present bilateral. Remaining integument unremarkable.  Vasculature:  Dorsalis Pedis pulse 2/4 bilateral. Posterior Tibial pulse  1/4 bilateral.  Capillary fill time <3 sec 1-5 bilateral. Scant hair growth to the level of the digits. Temperature gradient within normal limits. No varicosities present bilateral. No edema present bilateral.   Neurology: The  patient has intact sensation measured with a 5.07/10g Semmes Weinstein Monofilament at all pedal sites bilateral . Vibratory sensation diminished bilateral with tuning fork. No Babinski sign present bilateral.   Musculoskeletal: Asymptomatic bunion and lesser hammertoe pedal deformities noted bilateral. Muscular strength 5/5 in all lower extremity muscular groups bilateral without pain or limitation on range of motion . No tenderness with calf  compression bilateral.  Assessment and Plan: Problem List Items Addressed This Visit    None    Visit Diagnoses    Dermatophytosis of nail    -  Primary    Foot pain, bilateral        Diabetic polyneuropathy associated with type 2 diabetes mellitus ([place])        early with tingling in both big toes       -Examined patient. -Discussed and educated patient on diabetic foot care, especially with  regards to the vascular, neurological and musculoskeletal systems.  -Stressed the importance of good glycemic control and the detriment of not  controlling glucose levels in relation to the foot. -Mechanically debrided all nails 1-5 bilateral using sterile nail nipper and filed with dremel without incident  -Advised OTC skin emollients daily for dry skin -Answered all patient questions -Patient to return  in 3 months for at risk foot care -Patient advised to call the office if any problems or questions arise in the  Meantime.  Landis Martins, DPM

## 2015-07-21 NOTE — Patient Instructions (Signed)
Diabetes and Foot Care Diabetes may cause you to have problems because of poor blood supply (circulation) to your feet and legs. This may cause the skin on your feet to become thinner, break easier, and heal more slowly. Your skin may become dry, and the skin may peel and crack. You may also have nerve damage in your legs and feet causing decreased feeling in them. You may not notice minor injuries to your feet that could lead to infections or more serious problems. Taking care of your feet is one of the most important things you can do for yourself.  HOME CARE INSTRUCTIONS  Wear shoes at all times, even in the house. Do not go barefoot. Bare feet are easily injured.  Check your feet daily for blisters, cuts, and redness. If you cannot see the bottom of your feet, use a mirror or ask someone for help.  Wash your feet with warm water (do not use hot water) and mild soap. Then pat your feet and the areas between your toes until they are completely dry. Do not soak your feet as this can dry your skin.  Apply a moisturizing lotion or petroleum jelly (that does not contain alcohol and is unscented) to the skin on your feet and to dry, brittle toenails. Do not apply lotion between your toes.  Trim your toenails straight across. Do not dig under them or around the cuticle. File the edges of your nails with an emery board or nail file.  Do not cut corns or calluses or try to remove them with medicine.  Wear clean socks or stockings every day. Make sure they are not too tight. Do not wear knee-high stockings since they may decrease blood flow to your legs.  Wear shoes that fit properly and have enough cushioning. To break in new shoes, wear them for just a few hours a day. This prevents you from injuring your feet. Always look in your shoes before you put them on to be sure there are no objects inside.  Do not cross your legs. This may decrease the blood flow to your feet.  If you find a minor scrape,  cut, or break in the skin on your feet, keep it and the skin around it clean and dry. These areas may be cleansed with mild soap and water. Do not cleanse the area with peroxide, alcohol, or iodine.  When you remove an adhesive bandage, be sure not to damage the skin around it.  If you have a wound, look at it several times a day to make sure it is healing.  Do not use heating pads or hot water bottles. They may burn your skin. If you have lost feeling in your feet or legs, you may not know it is happening until it is too late.  Make sure your health care provider performs a complete foot exam at least annually or more often if you have foot problems. Report any cuts, sores, or bruises to your health care provider immediately. SEEK MEDICAL CARE IF:   You have an injury that is not healing.  You have cuts or breaks in the skin.  You have an ingrown nail.  You notice redness on your legs or feet.  You feel burning or tingling in your legs or feet.  You have pain or cramps in your legs and feet.  Your legs or feet are numb.  Your feet always feel cold. SEEK IMMEDIATE MEDICAL CARE IF:   There is increasing redness,   swelling, or pain in or around a wound.  There is a red line that goes up your leg.  Pus is coming from a wound.  You develop a fever or as directed by your health care provider.  You notice a bad smell coming from an ulcer or wound.   This information is not intended to replace advice given to you by your health care provider. Make sure you discuss any questions you have with your health care provider.   Document Released: 08/05/2000 Document Revised: 04/10/2013 Document Reviewed: 01/15/2013 Elsevier Interactive Patient Education 2016 [employer]  

## 2015-09-01 ENCOUNTER — Other Ambulatory Visit: Payer: Self-pay | Admitting: *Deleted

## 2015-09-01 MED ORDER — TAMSULOSIN HCL 0.4 MG PO CAPS: 0.4000 mg | ORAL_CAPSULE | Freq: Every day | ORAL | Status: DC

## 2015-09-07 ENCOUNTER — Other Ambulatory Visit: Payer: Self-pay | Admitting: Family Medicine

## 2015-09-07 ENCOUNTER — Other Ambulatory Visit (INDEPENDENT_AMBULATORY_CARE_PROVIDER_SITE_OTHER): Payer: No Typology Code available for payment source

## 2015-09-07 DIAGNOSIS — E1122 Type 2 diabetes mellitus with diabetic chronic kidney disease: Secondary | ICD-10-CM

## 2015-09-07 DIAGNOSIS — E785 Hyperlipidemia, unspecified: Secondary | ICD-10-CM

## 2015-09-07 DIAGNOSIS — R972 Elevated prostate specific antigen [PSA]: Secondary | ICD-10-CM

## 2015-09-07 DIAGNOSIS — I10 Essential (primary) hypertension: Secondary | ICD-10-CM

## 2015-09-07 DIAGNOSIS — E538 Deficiency of other specified B group vitamins: Secondary | ICD-10-CM

## 2015-09-07 DIAGNOSIS — N183 Chronic kidney disease, stage 3 unspecified: Secondary | ICD-10-CM

## 2015-09-07 DIAGNOSIS — E1121 Type 2 diabetes mellitus with diabetic nephropathy: Secondary | ICD-10-CM

## 2015-09-08 LAB — LIPID PANEL
CHOL/HDL RATIO: 2.9 ratio (ref <=5.0)
CHOLESTEROL: 113 mg/dL — AB (ref 125–200)
HDL: 39 mg/dL — AB (ref >=40)
LDL Cholesterol: 59 mg/dL (ref <130)
TRIGLYCERIDES: 75 mg/dL (ref <150)
VLDL: 15 mg/dL (ref <30)

## 2015-09-08 LAB — COMPREHENSIVE METABOLIC PANEL
ALK PHOS: 134 U/L — AB (ref 40–115)
ALT: 14 U/L (ref 9–46)
AST: 12 U/L (ref 10–35)
Albumin: 4.2 g/dL (ref 3.6–5.1)
BUN: 18 mg/dL (ref 7–25)
CALCIUM: 9.5 mg/dL (ref 8.6–10.3)
CO2: 28 mmol/L (ref 20–31)
Chloride: 106 mmol/L (ref 98–110)
Creat: 1.57 mg/dL — ABNORMAL HIGH (ref 0.70–1.25)
Glucose, Bld: 135 mg/dL — ABNORMAL HIGH (ref 65–99)
POTASSIUM: 4.3 mmol/L (ref 3.5–5.3)
Sodium: 142 mmol/L (ref 135–146)
TOTAL PROTEIN: 6.6 g/dL (ref 6.1–8.1)
Total Bilirubin: 0.6 mg/dL (ref 0.2–1.2)

## 2015-09-08 LAB — PSA, TOTAL AND FREE
PSA FREE PCT: 16 % — AB (ref >25)
PSA, Free: 0.69 ng/mL
PSA: 4.43 ng/mL — AB (ref <=4.00)

## 2015-09-08 LAB — HEMOGLOBIN A1C
Hgb A1c MFr Bld: 5.8 % — ABNORMAL HIGH (ref <5.7)
Mean Plasma Glucose: 120 mg/dL — ABNORMAL HIGH (ref <117)

## 2015-09-08 LAB — VITAMIN B12: VITAMIN B 12: 665 pg/mL (ref 211–911)

## 2015-09-11 ENCOUNTER — Ambulatory Visit (INDEPENDENT_AMBULATORY_CARE_PROVIDER_SITE_OTHER): Payer: No Typology Code available for payment source | Admitting: Family Medicine

## 2015-09-11 ENCOUNTER — Encounter: Payer: Self-pay | Admitting: Family Medicine

## 2015-09-11 VITALS — BP 134/70 | HR 80 | Temp 97.8°F | Ht 73.25 in | Wt 240.8 lb

## 2015-09-11 DIAGNOSIS — Z1211 Encounter for screening for malignant neoplasm of colon: Secondary | ICD-10-CM | POA: Diagnosis not present

## 2015-09-11 DIAGNOSIS — E538 Deficiency of other specified B group vitamins: Secondary | ICD-10-CM

## 2015-09-11 DIAGNOSIS — E785 Hyperlipidemia, unspecified: Secondary | ICD-10-CM

## 2015-09-11 DIAGNOSIS — E559 Vitamin D deficiency, unspecified: Secondary | ICD-10-CM

## 2015-09-11 DIAGNOSIS — R972 Elevated prostate specific antigen [PSA]: Secondary | ICD-10-CM | POA: Diagnosis not present

## 2015-09-11 DIAGNOSIS — E1121 Type 2 diabetes mellitus with diabetic nephropathy: Secondary | ICD-10-CM

## 2015-09-11 DIAGNOSIS — Z0000 Encounter for general adult medical examination without abnormal findings: Secondary | ICD-10-CM | POA: Diagnosis not present

## 2015-09-11 DIAGNOSIS — E1122 Type 2 diabetes mellitus with diabetic chronic kidney disease: Secondary | ICD-10-CM | POA: Diagnosis not present

## 2015-09-11 DIAGNOSIS — I10 Essential (primary) hypertension: Secondary | ICD-10-CM

## 2015-09-11 DIAGNOSIS — N183 Chronic kidney disease, stage 3 unspecified: Secondary | ICD-10-CM

## 2015-09-11 DIAGNOSIS — G40109 Localization-related (focal) (partial) symptomatic epilepsy and epileptic syndromes with simple partial seizures, not intractable, without status epilepticus: Secondary | ICD-10-CM

## 2015-09-11 NOTE — Assessment & Plan Note (Signed)
Preventative protocols reviewed and updated unless pt declined. Discussed healthy diet and lifestyle.  

## 2015-09-11 NOTE — Patient Instructions (Addendum)
Consider shingles shot in the future. We will refer you for repeat colonoscopy. We will refer you to urology to discuss elevated prostate level.  Return in 6 months for follow up visit.  Health Maintenance, Male A healthy lifestyle and preventative care can promote health and wellness.  Maintain regular health, dental, and eye exams.  Eat a healthy diet. Foods like vegetables, fruits, whole grains, low-fat dairy products, and lean protein foods contain the nutrients you need and are low in calories. Decrease your intake of foods high in solid fats, added sugars, and salt. Get information about a proper diet from your health care provider, if necessary.  Regular physical exercise is one of the most important things you can do for your health. Most adults should get at least 150 minutes of moderate-intensity exercise (any activity that increases your heart rate and causes you to sweat) each week. In addition, most adults need muscle-strengthening exercises on 2 or more days a week.   Maintain a healthy weight. The body mass index (BMI) is a screening tool to identify possible weight problems. It provides an estimate of body fat based on height and weight. Your health care provider can find your BMI and can help you achieve or maintain a healthy weight. For males 20 years and older:  A BMI below 18.5 is considered underweight.  A BMI of 18.5 to 24.9 is normal.  A BMI of 25 to 29.9 is considered overweight.  A BMI of 30 and above is considered obese.  Maintain normal blood lipids and cholesterol by exercising and minimizing your intake of saturated fat. Eat a balanced diet with plenty of fruits and vegetables. Blood tests for lipids and cholesterol should begin at age 66 and be repeated every 5 years. If your lipid or cholesterol levels are high, you are over age 45, or you are at high risk for heart disease, you may need your cholesterol levels checked more frequently.Ongoing high lipid and  cholesterol levels should be treated with medicines if diet and exercise are not working.  If you smoke, find out from your health care provider how to quit. If you do not use tobacco, do not start.  Lung cancer screening is recommended for adults aged 41-80 years who are at high risk for developing lung cancer because of a history of smoking. A yearly low-dose CT scan of the lungs is recommended for people who have at least a 30-pack-year history of smoking and are current smokers or have quit within the past 15 years. A pack year of smoking is smoking an average of 1 pack of cigarettes a day for 1 year (for example, a 30-pack-year history of smoking could mean smoking 1 pack a day for 30 years or 2 packs a day for 15 years). Yearly screening should continue until the smoker has stopped smoking for at least 15 years. Yearly screening should be stopped for people who develop a health problem that would prevent them from having lung cancer treatment.  If you choose to drink alcohol, do not have more than 2 drinks per day. One drink is considered to be 12 oz (360 mL) of beer, 5 oz (150 mL) of wine, or 1.5 oz (45 mL) of liquor.  Avoid the use of street drugs. Do not share needles with anyone. Ask for help if you need support or instructions about stopping the use of drugs.  High blood pressure causes heart disease and increases the risk of stroke. High blood pressure is more  likely to develop in:  People who have blood pressure in the end of the normal range (100-139/85-89 mm Hg).  People who are overweight or obese.  People who are African American.  If you are 58-30 years of age, have your blood pressure checked every 3-5 years. If you are 50 years of age or older, have your blood pressure checked every year. You should have your blood pressure measured twice--once when you are at a hospital or clinic, and once when you are not at a hospital or clinic. Record the average of the two measurements. To  check your blood pressure when you are not at a hospital or clinic, you can use:  An automated blood pressure machine at a pharmacy.  A home blood pressure monitor.  If you are 23-18 years old, ask your health care provider if you should take aspirin to prevent heart disease.  Diabetes screening involves taking a blood sample to check your fasting blood sugar level. This should be done once every 3 years after age 11 if you are at a normal weight and without risk factors for diabetes. Testing should be considered at a younger age or be carried out more frequently if you are overweight and have at least 1 risk factor for diabetes.  Colorectal cancer can be detected and often prevented. Most routine colorectal cancer screening begins at the age of 100 and continues through age 95. However, your health care provider may recommend screening at an earlier age if you have risk factors for colon cancer. On a yearly basis, your health care provider may provide home test kits to check for hidden blood in the stool. A small camera at the end of a tube may be used to directly examine the colon (sigmoidoscopy or colonoscopy) to detect the earliest forms of colorectal cancer. Talk to your health care provider about this at age 81 when routine screening begins. A direct exam of the colon should be repeated every 5-10 years through age 39, unless early forms of precancerous polyps or small growths are found.  People who are at an increased risk for hepatitis B should be screened for this virus. You are considered at high risk for hepatitis B if:  You were born in a country where hepatitis B occurs often. Talk with your health care provider about which countries are considered high risk.  Your parents were born in a high-risk country and you have not received a shot to protect against hepatitis B (hepatitis B vaccine).  You have HIV or AIDS.  You use needles to inject street drugs.  You live with, or have sex  with, someone who has hepatitis B.  You are a man who has sex with other men (MSM).  You get hemodialysis treatment.  You take certain medicines for conditions like cancer, organ transplantation, and autoimmune conditions.  Hepatitis C blood testing is recommended for all people born from [birth year] through 1963 and any individual with known risk factors for hepatitis C.  Healthy men should no longer receive prostate-specific antigen (PSA) blood tests as part of routine cancer screening. Talk to your health care provider about prostate cancer screening.  Testicular cancer screening is not recommended for adolescents or adult males who have no symptoms. Screening includes self-exam, a health care provider exam, and other screening tests. Consult with your health care provider about any symptoms you have or any concerns you have about testicular cancer.  Practice safe sex. Use condoms and avoid high-risk sexual practices  to reduce the spread of sexually transmitted infections (STIs).  You should be screened for STIs, including gonorrhea and chlamydia if:  You are sexually active and are younger than 24 years.  You are older than 24 years, and your health care provider tells you that you are at risk for this type of infection.  Your sexual activity has changed since you were last screened, and you are at an increased risk for chlamydia or gonorrhea. Ask your health care provider if you are at risk.  If you are at risk of being infected with HIV, it is recommended that you take a prescription medicine daily to prevent HIV infection. This is called pre-exposure prophylaxis (PrEP). You are considered at risk if:  You are a man who has sex with other men (MSM).  You are a heterosexual man who is sexually active with multiple partners.  You take drugs by injection.  You are sexually active with a partner who has HIV.  Talk with your health care provider about whether you are at high risk of being  infected with HIV. If you choose to begin PrEP, you should first be tested for HIV. You should then be tested every 3 months for as long as you are taking PrEP.  Use sunscreen. Apply sunscreen liberally and repeatedly throughout the day. You should seek shade when your shadow is shorter than you. Protect yourself by wearing long sleeves, pants, a wide-brimmed hat, and sunglasses year round whenever you are outdoors.  Tell your health care provider of new moles or changes in moles, especially if there is a change in shape or color. Also, tell your health care provider if a mole is larger than the size of a pencil eraser.  A one-time screening for abdominal aortic aneurysm (AAA) and surgical repair of large AAAs by ultrasound is recommended for men aged 39-75 years who are current or former smokers.  Stay current with your vaccines (immunizations).   This information is not intended to replace advice given to you by your health care provider. Make sure you discuss any questions you have with your health care provider.   Document Released: 02/04/2008 Document Revised: 08/29/2014 Document Reviewed: 01/03/2011 Elsevier Interactive Patient Education [employer].

## 2015-09-11 NOTE — Assessment & Plan Note (Signed)
Reviewed recent readings. Will refer to urology. Has endorsed some BPH sxs, last visit 3 mo ago we started flomax. Desires to continue.

## 2015-09-11 NOTE — Addendum Note (Signed)
Addended by: Ellamae Sia on: 09/11/2015 12:08 PM   Modules accepted: Orders

## 2015-09-11 NOTE — Assessment & Plan Note (Signed)
Continue vit D 1000 IU daily. 

## 2015-09-11 NOTE — Progress Notes (Signed)
BP 134/70 mmHg  Pulse 80  Temp(Src) 97.8 F (36.6 C) (Oral)  Ht 6' 1.25" (1.861 m)  Wt 240 lb 12 oz (109.203 kg)  BMI 31.53 kg/m2   CC: CPE  Subjective:    Patient ID: Kirk Bennett., male    DOB: 27-Jan-1951, 65 y.o.   MRN: [id number]  HPI: Kirk Bennett. is a 65 y.o. male presenting on 09/11/2015 for Annual Exam   Preventative: COLONOSCOPY Date: 09/16/2005 hyperplastic polyps, rpt due 10 yrs. Interested in rpt. Requests  referral.  Prostate cancer screening - DRE/PSA 1.7 04/2009, 2.0 06/2010, 2.38 2013, 4.47 04/2015, 4.43 08/2015 (free 16%).  Influenza yearly Tdap - 04/2014 Pneumovax 2013 Had hepatitis A at age 64yo, hospitalized [facility], [place].  No hep B in past. zostavax - will check with insurance Seat belt use discussed.  No suspicious moles on skin.  Lives with wife and granddaughter, 1 cat Occupation: retired, worked Oncologist at school Activity: walking regularly Diet: good water, good vegetables, follows diabetic diet.  Relevant past medical, surgical, family and social history reviewed and updated as indicated. Interim medical history since our last visit reviewed. Allergies and medications reviewed and updated. Current Outpatient Prescriptions on File Prior to Visit  Medication Sig  . atorvastatin (LIPITOR) 20 MG tablet Take 1 tablet (20 mg total) by mouth daily.  Marland Kitchen b complex vitamins tablet Take 1 tablet by mouth daily.  . Cholecalciferol (VITAMIN D3) 1000 UNITS CAPS Take 1 capsule (1,000 Units total) by mouth daily.  . Dapagliflozin Propanediol (FARXIGA) 5 MG TABS Take 1 tablet by mouth daily.  Marland Kitchen lamoTRIgine (LAMICTAL) 150 MG tablet Take 1 tablet (150 mg total) by mouth 2 (two) times daily.  Marland Kitchen lisinopril (PRINIVIL,ZESTRIL) 10 MG tablet Take 1 tablet (10 mg total) by mouth 2 (two) times daily.  . metFORMIN (GLUCOPHAGE) 1000 MG tablet TAKE 1 TABLET BY MOUTH TWICE A DAY WITH A MEAL  . ONE TOUCH ULTRA TEST test strip TEST  BLOOD SUGAR FASTING IN THE MORNING AND 2 HOURS AFTER LUNCH OR DINNER  . tamsulosin (FLOMAX) 0.4 MG CAPS capsule Take 1 capsule (0.4 mg total) by mouth daily.   No current facility-administered medications on file prior to visit.    Review of Systems  Constitutional: Negative for fever, chills, activity change, appetite change, fatigue and unexpected weight change.  HENT: Negative for hearing loss.   Eyes: Negative for visual disturbance.  Respiratory: Positive for chest tightness (MSK chest wall pain after shoveling snow). Negative for cough, shortness of breath and wheezing.   Cardiovascular: Negative for chest pain, palpitations and leg swelling.  Gastrointestinal: Positive for constipation (1 month). Negative for nausea, vomiting, abdominal pain, diarrhea, blood in stool and abdominal distention.  Genitourinary: Negative for hematuria and difficulty urinating.  Musculoskeletal: Negative for myalgias, arthralgias and neck pain.  Skin: Negative for rash.  Neurological: Negative for dizziness, seizures, syncope and headaches.  Hematological: Negative for adenopathy. Does not bruise/bleed easily.  Psychiatric/Behavioral: Negative for dysphoric mood. The patient is not nervous/anxious.    Per HPI unless specifically indicated in ROS section     Objective:    BP 134/70 mmHg  Pulse 80  Temp(Src) 97.8 F (36.6 C) (Oral)  Ht 6' 1.25" (1.861 m)  Wt 240 lb 12 oz (109.203 kg)  BMI 31.53 kg/m2  Wt Readings from Last 3 Encounters:  09/11/15 240 lb 12 oz (109.203 kg)  07/13/15 241 lb 4 oz (109.43 kg)  07/09/15 244 lb (110.678  kg)   Physical Exam  Constitutional: He is oriented to person, place, and time. He appears well-developed and well-nourished. No distress.  HENT:  Head: Normocephalic and atraumatic.  Right Ear: Hearing, tympanic membrane, external ear and ear canal normal.  Left Ear: Hearing, tympanic membrane, external ear and ear canal normal.  Nose: Nose normal.    Mouth/Throat: Uvula is midline, oropharynx is clear and moist and mucous membranes are normal. No oropharyngeal exudate, posterior oropharyngeal edema or posterior oropharyngeal erythema.  Eyes: Conjunctivae and EOM are normal. Pupils are equal, round, and reactive to light. No scleral icterus.  Neck: Normal range of motion. Neck supple. Carotid bruit is not present. No thyromegaly present.  Cardiovascular: Normal rate, regular rhythm, normal heart sounds and intact distal pulses.   No murmur heard. Pulses:      Radial pulses are 2+ on the right side, and 2+ on the left side.  Pulmonary/Chest: Effort normal and breath sounds normal. No respiratory distress. He has no wheezes. He has no rales.  Abdominal: Soft. Bowel sounds are normal. He exhibits no distension and no mass. There is no tenderness. There is no rebound and no guarding.  Genitourinary:  DRE deferred given upcoming urology referral  Musculoskeletal: Normal range of motion. He exhibits no edema.  Lymphadenopathy:    He has no cervical adenopathy.  Neurological: He is alert and oriented to person, place, and time.  CN grossly intact, station and gait intact  Skin: Skin is warm and dry. No rash noted.  Psychiatric: He has a normal mood and affect. His behavior is normal. Judgment and thought content normal.  Nursing note and vitals reviewed.  Results for orders placed or performed in visit on 09/07/15  Vitamin B12  Result Value Ref Range   Vitamin B-12 665 211 - 911 pg/mL  Lipid panel  Result Value Ref Range   Cholesterol 113 (L) 125 - 200 mg/dL   Triglycerides 75 <150 mg/dL   HDL 39 (L) >=40 mg/dL   Total CHOL/HDL Ratio 2.9 <=5.0 Ratio   VLDL 15 <30 mg/dL   LDL Cholesterol 59 <130 mg/dL  Hemoglobin A1c  Result Value Ref Range   Hgb A1c MFr Bld 5.8 (H) <5.7 %   Mean Plasma Glucose 120 (H) <117 mg/dL  Comprehensive metabolic panel  Result Value Ref Range   Sodium 142 135 - 146 mmol/L   Potassium 4.3 3.5 - 5.3 mmol/L    Chloride 106 98 - 110 mmol/L   CO2 28 20 - 31 mmol/L   Glucose, Bld 135 (H) 65 - 99 mg/dL   BUN 18 7 - 25 mg/dL   Creat 1.57 (H) 0.70 - 1.25 mg/dL   Total Bilirubin 0.6 0.2 - 1.2 mg/dL   Alkaline Phosphatase 134 (H) 40 - 115 U/L   AST 12 10 - 35 U/L   ALT 14 9 - 46 U/L   Total Protein 6.6 6.1 - 8.1 g/dL   Albumin 4.2 3.6 - 5.1 g/dL   Calcium 9.5 8.6 - 10.3 mg/dL  PSA, total and free  Result Value Ref Range   PSA 4.43 (H) <=4.00 ng/mL   PSA, Free 0.69 ng/mL   PSA, Free Pct 16 (L) >25 %      Assessment & Plan:   Problem List Items Addressed This Visit    Vitamin D deficiency    Continue vit D 1000 IU daily.      Low vitamin B12 level    B12 level back to normal. Continue 527mcg  MWF.      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 Followed by [facility] neurology. Continue keppra and lamictal.      Hyperlipidemia    Chronic, stable. Great control on lipitor - continue      Healthcare maintenance - Primary    Preventative protocols reviewed and updated unless pt declined. Discussed healthy diet and lifestyle.       Essential hypertension, benign    Chronic, stable. Continue current regimen.      Elevated PSA    Reviewed recent readings. Will refer to urology. Has endorsed some BPH sxs, last visit 3 mo ago we started flomax. Desires to continue.      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 Great control. Appreciate endo care.      Relevant Medications   glimepiride (AMARYL) 2 MG tablet   CKD stage 3 due to type 2 diabetes mellitus (HCC)    Chronic, stable. Continue to monitor. Saw Dr Holley Raring x1.       Relevant Medications   glimepiride (AMARYL) 2 MG tablet    Other Visit Diagnoses    Special screening for malignant neoplasms, colon        Relevant Orders    Ambulatory referral to Gastroenterology         Follow up plan: Return in about 6 months (around 03/10/2016), or as needed, for follow up visit.

## 2015-09-11 NOTE — Assessment & Plan Note (Signed)
Chronic, stable. Continue to monitor. Saw Dr Holley Raring x1.

## 2015-09-11 NOTE — Assessment & Plan Note (Addendum)
B12 level back to normal. Continue 534mcg MWF.

## 2015-09-11 NOTE — Assessment & Plan Note (Addendum)
Chronic, stable. Followed by [facility] neurology. Continue keppra and lamictal.

## 2015-09-11 NOTE — Assessment & Plan Note (Signed)
Chronic, stable. Great control on lipitor - continue

## 2015-09-11 NOTE — Assessment & Plan Note (Signed)
Chronic, stable. Great control. Appreciate endo care.

## 2015-09-11 NOTE — Progress Notes (Signed)
Pre visit review using our clinic review tool, if applicable. No additional management support is needed unless otherwise documented below in the visit note. 

## 2015-09-11 NOTE — Addendum Note (Signed)
Addended by: Marchia Bond on: 09/11/2015 01:36 PM   Modules accepted: Miquel Dunn

## 2015-09-11 NOTE — Assessment & Plan Note (Signed)
Chronic, stable. Continue current regimen. 

## 2015-09-12 LAB — MICROALBUMIN / CREATININE URINE RATIO
Creatinine, Urine: 89 mg/dL (ref 20–370)
Microalb, Ur: <0.2 mg/dL

## 2015-10-15 ENCOUNTER — Encounter: Payer: Self-pay | Admitting: Family Medicine

## 2015-10-15 ENCOUNTER — Ambulatory Visit (INDEPENDENT_AMBULATORY_CARE_PROVIDER_SITE_OTHER): Payer: No Typology Code available for payment source | Admitting: Family Medicine

## 2015-10-15 VITALS — BP 134/56 | HR 83 | Temp 98.2°F | Wt 245.8 lb

## 2015-10-15 DIAGNOSIS — B9789 Other viral agents as the cause of diseases classified elsewhere: Principal | ICD-10-CM

## 2015-10-15 DIAGNOSIS — J069 Acute upper respiratory infection, unspecified: Secondary | ICD-10-CM

## 2015-10-15 MED ORDER — GUAIFENESIN-CODEINE 100-10 MG/5ML PO SYRP: 5.0000 mL | ORAL_SOLUTION | Freq: Two times a day (BID) | ORAL | Status: DC | PRN

## 2015-10-15 MED ORDER — AZITHROMYCIN 250 MG PO TABS: ORAL_TABLET | ORAL | Status: DC

## 2015-10-15 NOTE — Assessment & Plan Note (Signed)
Anticipate viral bronchitis - discussed supportive care as per instructions cheratussin for cough. zpack WASP if not improving. Pt agrees with plan.

## 2015-10-15 NOTE — Progress Notes (Signed)
BP 134/56 mmHg  Pulse 83  Temp(Src) 98.2 F (36.8 C) (Oral)  Wt 245 lb 12 oz (111.471 kg)  SpO2 97%   CC: cough  Subjective:    Patient ID: Kirk Bennett., male    DOB: 1950-11-03, 65 y.o.   MRN: [id number]  HPI: Kirk Bennett. is a 65 y.o. male presenting on 10/15/2015 for Cough   6d h/o congestion, rhinorrhea, cough productive of mucous worse when laying down. +PNdrainage. Feels wheezy and mildly short winded.   No fevers/chills, ST, ear or tooth pain, headache. No body aches. No chest pain.   Wife getting sick as well. No smokers at home No h/o asthma. So far has tried tylenol cold/sinus, nyquil, cough drops.   Relevant past medical, surgical, family and social history reviewed and updated as indicated. Interim medical history since our last visit reviewed. Allergies and medications reviewed and updated. Current Outpatient Prescriptions on File Prior to Visit  Medication Sig  . atorvastatin (LIPITOR) 20 MG tablet Take 1 tablet (20 mg total) by mouth daily.  Marland Kitchen b complex vitamins tablet Take 1 tablet by mouth daily.  . Cholecalciferol (VITAMIN D3) 1000 UNITS CAPS Take 1 capsule (1,000 Units total) by mouth daily.  . Dapagliflozin Propanediol (FARXIGA) 5 MG TABS Take 1 tablet by mouth daily.  Marland Kitchen glimepiride (AMARYL) 2 MG tablet Take 2 mg by mouth every other day.  . levETIRAcetam (KEPPRA) 750 MG tablet Take 2 tablets (1,500 mg total) by mouth as directed.  Marland Kitchen lisinopril (PRINIVIL,ZESTRIL) 10 MG tablet Take 1 tablet (10 mg total) by mouth 2 (two) times daily.  . metFORMIN (GLUCOPHAGE) 1000 MG tablet TAKE 1 TABLET BY MOUTH TWICE A DAY WITH A MEAL  . ONE TOUCH ULTRA TEST test strip TEST BLOOD SUGAR FASTING IN THE MORNING AND 2 HOURS AFTER LUNCH OR DINNER  . tamsulosin (FLOMAX) 0.4 MG CAPS capsule Take 1 capsule (0.4 mg total) by mouth daily.   No current facility-administered medications on file prior to visit.    Past Medical History  Diagnosis Date  . Well  controlled type 2 diabetes mellitus with nephropathy ([place]) 1996    established with Dr. Gabriel Carina, endo  . HTN (hypertension)   . HLD (hyperlipidemia)   . Complex partial seizures ([place]) 1990    from surgery for R temporal arachnoid cyst s/p drainage, possible continued sz so changed to lamotrigine ([facility] Mora [facility]man at [facility])  . Knee pain     s/p replacement  . History of hepatitis A   . SVT (supraventricular tachycardia) ([place]) 1996  . Cataracts, bilateral 02/2011    and suspected glaucoma, to return for f/u, no diabetic retinopathy  . Depression     found by neuro  . Glaucoma 2015  . CKD stage 3 due to type 2 diabetes mellitus ([place]) 2015    normal renal US, self referred to Dr Holley Raring  . Vitamin D deficiency 03/02/2015    Review of Systems Per HPI unless specifically indicated in ROS section     Objective:    BP 134/56 mmHg  Pulse 83  Temp(Src) 98.2 F (36.8 C) (Oral)  Wt 245 lb 12 oz (111.471 kg)  SpO2 97%  Wt Readings from Last 3 Encounters:  10/15/15 245 lb 12 oz (111.471 kg)  09/11/15 240 lb 12 oz (109.203 kg)  07/13/15 241 lb 4 oz (109.43 kg)    Physical Exam  Constitutional: He appears well-developed and well-nourished. No distress.  HENT:  Head: Normocephalic and atraumatic.  Right Ear: Hearing, tympanic membrane, external ear and ear canal normal.  Left Ear: Hearing, tympanic membrane, external ear and ear canal normal.  Nose: Mucosal edema and rhinorrhea present. Right sinus exhibits no maxillary sinus tenderness and no frontal sinus tenderness. Left sinus exhibits no maxillary sinus tenderness and no frontal sinus tenderness.  Mouth/Throat: Uvula is midline, oropharynx is clear and moist and mucous membranes are normal. No oropharyngeal exudate, posterior oropharyngeal edema, posterior oropharyngeal erythema or tonsillar abscesses.  Slight injection of TMs Nasal mucosal congestion and edema  Eyes: Conjunctivae and EOM are normal. Pupils are equal, round, and reactive to  light. No scleral icterus.  Neck: Normal range of motion. Neck supple.  Cardiovascular: Normal rate, regular rhythm, normal heart sounds and intact distal pulses.   No murmur heard. Pulmonary/Chest: Effort normal and breath sounds normal. No respiratory distress. He has no wheezes. He has no rales.  Lungs clear  Lymphadenopathy:    He has no cervical adenopathy.  Skin: Skin is warm and dry. No rash noted.  Nursing note and vitals reviewed.     Assessment & Plan:   Problem List Items Addressed This Visit    Viral URI with cough - Primary    Anticipate viral bronchitis - discussed supportive care as per instructions cheratussin for cough. zpack WASP if not improving. Pt agrees with plan.      Relevant Medications   azithromycin (ZITHROMAX) 250 MG tablet       Follow up plan: Return if symptoms worsen or fail to improve.

## 2015-10-15 NOTE — Patient Instructions (Signed)
You have a viral upper respiratory infection. Antibiotics are not needed for this.  Viral infections usually take 7-10 days to resolve.  The cough can last a few weeks to go away. Use medication as prescribed: cheratussin codeine cough syrup for cough suppression.  Push fluids and plenty of rest. Take plain mucinex (or immediate release guaifenesin) with plenty of water to help mobilize mucous. If ongoing symptoms past 10 days or worsening productive cough or fever >101, fill antibiotic provided today.  Call clinic with questions.  Good to see you today. I hope you start feeling better soon.

## 2015-10-15 NOTE — Progress Notes (Signed)
Pre visit review using our clinic review tool, if applicable. No additional management support is needed unless otherwise documented below in the visit note. 

## 2015-10-17 ENCOUNTER — Other Ambulatory Visit: Payer: Self-pay | Admitting: Family Medicine

## 2015-10-20 ENCOUNTER — Encounter: Payer: Self-pay | Admitting: Sports Medicine

## 2015-10-20 ENCOUNTER — Ambulatory Visit (INDEPENDENT_AMBULATORY_CARE_PROVIDER_SITE_OTHER): Payer: 59 | Admitting: Sports Medicine

## 2015-10-20 DIAGNOSIS — M79672 Pain in left foot: Secondary | ICD-10-CM | POA: Diagnosis not present

## 2015-10-20 DIAGNOSIS — M79671 Pain in right foot: Secondary | ICD-10-CM | POA: Diagnosis not present

## 2015-10-20 DIAGNOSIS — B351 Tinea unguium: Secondary | ICD-10-CM

## 2015-10-20 DIAGNOSIS — E1142 Type 2 diabetes mellitus with diabetic polyneuropathy: Secondary | ICD-10-CM

## 2015-10-20 NOTE — Progress Notes (Addendum)
Patient ID: Kirk Bennett., male   DOB: 1951/08/12, 65 y.o.   MRN: [id number]  Subjective: Kirk Bennett. is a 65 y.o. male patient with history of type 2 diabetes who presents to office today complaining of long, painful nails  while ambulating in shoes; unable to trim. Patient states that the glucose reading this morning was "good"; unable to give a #. Patient denies any new changes in medication or new problems. Patient denies any new cramping, numbness, burning or tingling in the legs.    Patient Active Problem List   Diagnosis Date Noted  . Viral URI with cough 10/15/2015  . Constipation 07/13/2015  . Toe pain, left 07/13/2015  . Low vitamin B12 level 05/27/2015  . Elevated PSA 05/27/2015  . Unsteadiness on feet 03/02/2015  . Vitamin D deficiency 03/02/2015  . Urinary frequency 11/07/2014  . CKD stage 3 due to type 2 diabetes mellitus ([place])   . Incomplete emptying of bladder 04/23/2014  . Healthcare maintenance 04/26/2011  . TOTAL KNEE REPLACEMENT, LEFT, HX OF 04/07/2010  . 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 Hyperlipidemia 03/30/2010  . 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 Essential hypertension, benign 03/30/2010   Current Outpatient Prescriptions on File Prior to Visit  Medication Sig Dispense Refill  . atorvastatin (LIPITOR) 20 MG tablet Take 1 tablet (20 mg total) by mouth daily. 90 tablet 3  . azithromycin (ZITHROMAX) 250 MG tablet Take two tablets on day one followed by one tablet on days 2-5 6 each 0  . b complex vitamins tablet Take 1 tablet by mouth daily.    . Cholecalciferol (VITAMIN D3) 1000 UNITS CAPS Take 1 capsule (1,000 Units total) by mouth daily. 30 capsule   . Dapagliflozin Propanediol (FARXIGA) 5 MG TABS Take 1 tablet by mouth daily.    Marland Kitchen glimepiride (AMARYL) 2 MG tablet Take 2 mg by mouth every other day.    Marland Kitchen guaiFENesin-codeine (CHERATUSSIN AC) 100-10 MG/5ML  syrup Take 5 mLs by mouth 2 (two) times daily as needed for cough (sedation precautions). 140 mL 0  . LamoTRIgine 300 MG TB24 Take 1 capsule by mouth every evening.    . levETIRAcetam (KEPPRA) 750 MG tablet Take 2 tablets (1,500 mg total) by mouth 2 (two) times daily.    Marland Kitchen lisinopril (PRINIVIL,ZESTRIL) 10 MG tablet Take 1 tablet (10 mg total) by mouth 2 (two) times daily. 180 tablet 3  . metFORMIN (GLUCOPHAGE) 1000 MG tablet TAKE 1 TABLET BY MOUTH TWICE A DAY WITH A MEAL 180 tablet 3  . ONE TOUCH ULTRA TEST test strip TEST BLOOD SUGAR FASTING IN THE MORNING AND 2 HOURS AFTER LUNCH OR DINNER 100 each 3  . tamsulosin (FLOMAX) 0.4 MG CAPS capsule Take 1 capsule (0.4 mg total) by mouth daily. 90 capsule 1   No current facility-administered medications on file prior to visit.   No Known Allergies   Labs: HEMOGLOBIN A1C 5.8 (08/2015)   Objective: General: Patient is awake, alert, and oriented x 3 and in no acute distress.  Integument: Skin is warm, dry and supple bilateral. Nails are tender, long, thickened and  dystrophic with subungual debris, consistent with onychomycosis, 1-5 bilateral. No signs of infection. No open lesions or preulcerative lesions present bilateral. Remaining integument unremarkable.  Vasculature:  Dorsalis Pedis pulse 2/4 bilateral. Posterior Tibial pulse  1/4 bilateral.  Capillary fill time <3 sec 1-5 bilateral. Scant hair growth to the level of the  digits. Temperature gradient within normal limits. No varicosities present bilateral. No edema present bilateral.   Neurology: The patient has intact sensation measured with a 5.07/10g Semmes Weinstein Monofilament at all pedal sites bilateral . Vibratory sensation diminished bilateral with tuning fork. No Babinski sign present bilateral.   Musculoskeletal: Asymptomatic bunion and lesser hammertoe pedal deformities noted bilateral. Muscular strength 5/5 in all lower extremity muscular groups bilateral without pain or  limitation on range of motion . No tenderness with calf compression bilateral.  Assessment and Plan: Problem List Items Addressed This Visit    None    Visit Diagnoses    Dermatophytosis of nail    -  Primary    Foot pain, bilateral        Diabetic polyneuropathy associated with type 2 diabetes mellitus ([place])           -Examined patient. -Discussed and educated patient on diabetic foot care, especially with  regards to the vascular, neurological and musculoskeletal systems.  -Stressed the importance of good glycemic control and the detriment of not  controlling glucose levels in relation to the foot. -Mechanically debrided all nails 1-5 bilateral using sterile nail nipper and filed with dremel without incident  -Advised OTC skin emollients daily for dry skin -Answered all patient questions -Patient to return  in 3 months for at risk foot care -Patient advised to call the office if any problems or questions arise in the  Meantime.  Landis Martins, DPM

## 2015-10-22 ENCOUNTER — Other Ambulatory Visit: Payer: Self-pay | Admitting: Family Medicine

## 2015-10-22 NOTE — Telephone Encounter (Signed)
Ok to refill 

## 2015-10-22 NOTE — Telephone Encounter (Signed)
Rx called in as directed.   

## 2015-10-22 NOTE — Telephone Encounter (Signed)
plz phone in. 

## 2015-11-12 ENCOUNTER — Encounter: Payer: Self-pay | Admitting: Gastroenterology

## 2015-12-10 ENCOUNTER — Encounter: Payer: Self-pay | Admitting: *Deleted

## 2015-12-11 ENCOUNTER — Encounter: Payer: Self-pay | Admitting: *Deleted

## 2015-12-11 ENCOUNTER — Encounter
Admission: RE | Disposition: A | Discharge status: Home Health | Payer: Self-pay | Source: Ambulatory Visit | Attending: Gastroenterology

## 2015-12-11 ENCOUNTER — Ambulatory Visit: Payer: No Typology Code available for payment source | Admitting: Anesthesiology

## 2015-12-11 ENCOUNTER — Ambulatory Visit
Admission: RE | Admit: 2015-12-11 | Discharge: 2015-12-11 | Disposition: A | Discharge status: Home Health | Payer: No Typology Code available for payment source | Source: Ambulatory Visit | Attending: Gastroenterology | Admitting: Gastroenterology

## 2015-12-11 DIAGNOSIS — D124 Benign neoplasm of descending colon: Secondary | ICD-10-CM | POA: Diagnosis not present

## 2015-12-11 DIAGNOSIS — Z1211 Encounter for screening for malignant neoplasm of colon: Secondary | ICD-10-CM | POA: Diagnosis not present

## 2015-12-11 DIAGNOSIS — K635 Polyp of colon: Secondary | ICD-10-CM | POA: Diagnosis not present

## 2015-12-11 DIAGNOSIS — Z79899 Other long term (current) drug therapy: Secondary | ICD-10-CM | POA: Diagnosis not present

## 2015-12-11 DIAGNOSIS — E785 Hyperlipidemia, unspecified: Secondary | ICD-10-CM | POA: Diagnosis not present

## 2015-12-11 DIAGNOSIS — I471 Supraventricular tachycardia: Secondary | ICD-10-CM | POA: Diagnosis not present

## 2015-12-11 DIAGNOSIS — K5730 Diverticulosis of large intestine without perforation or abscess without bleeding: Secondary | ICD-10-CM | POA: Diagnosis not present

## 2015-12-11 DIAGNOSIS — K621 Rectal polyp: Secondary | ICD-10-CM | POA: Diagnosis not present

## 2015-12-11 DIAGNOSIS — N183 Chronic kidney disease, stage 3 (moderate): Secondary | ICD-10-CM | POA: Diagnosis not present

## 2015-12-11 DIAGNOSIS — E1121 Type 2 diabetes mellitus with diabetic nephropathy: Secondary | ICD-10-CM | POA: Insufficient documentation

## 2015-12-11 DIAGNOSIS — F329 Major depressive disorder, single episode, unspecified: Secondary | ICD-10-CM | POA: Insufficient documentation

## 2015-12-11 DIAGNOSIS — R569 Unspecified convulsions: Secondary | ICD-10-CM | POA: Diagnosis not present

## 2015-12-11 DIAGNOSIS — I129 Hypertensive chronic kidney disease with stage 1 through stage 4 chronic kidney disease, or unspecified chronic kidney disease: Secondary | ICD-10-CM | POA: Insufficient documentation

## 2015-12-11 DIAGNOSIS — Z7984 Long term (current) use of oral hypoglycemic drugs: Secondary | ICD-10-CM | POA: Insufficient documentation

## 2015-12-11 DIAGNOSIS — E1122 Type 2 diabetes mellitus with diabetic chronic kidney disease: Secondary | ICD-10-CM | POA: Insufficient documentation

## 2015-12-11 DIAGNOSIS — D122 Benign neoplasm of ascending colon: Secondary | ICD-10-CM | POA: Insufficient documentation

## 2015-12-11 HISTORY — PX: COLONOSCOPY WITH PROPOFOL: SHX5780

## 2015-12-11 LAB — GLUCOSE, CAPILLARY: Glucose-Capillary: 122 mg/dL — ABNORMAL HIGH (ref 65–99)

## 2015-12-11 SURGERY — COLONOSCOPY WITH PROPOFOL
Anesthesia: General

## 2015-12-11 MED ORDER — FENTANYL CITRATE (PF) 100 MCG/2ML IJ SOLN
INTRAMUSCULAR | Status: DC | PRN
  Administered 2015-12-11: 50 ug via INTRAVENOUS

## 2015-12-11 MED ORDER — SODIUM CHLORIDE 0.9 % IV SOLN
INTRAVENOUS | Status: DC
  Administered 2015-12-11 (×2): via INTRAVENOUS

## 2015-12-11 MED ORDER — EPHEDRINE SULFATE 50 MG/ML IJ SOLN
INTRAMUSCULAR | Status: DC | PRN
  Administered 2015-12-11: 10 mg via INTRAVENOUS

## 2015-12-11 MED ORDER — PROPOFOL 500 MG/50ML IV EMUL
INTRAVENOUS | Status: DC | PRN
  Administered 2015-12-11: 140 ug/kg/min via INTRAVENOUS

## 2015-12-11 MED ORDER — MIDAZOLAM HCL 5 MG/5ML IJ SOLN
INTRAMUSCULAR | Status: DC | PRN
  Administered 2015-12-11: 2 mg via INTRAVENOUS

## 2015-12-11 MED ORDER — SODIUM CHLORIDE 0.9 % IV SOLN: INTRAVENOUS | Status: DC

## 2015-12-11 MED ORDER — PROPOFOL 10 MG/ML IV BOLUS
INTRAVENOUS | Status: DC | PRN
  Administered 2015-12-11: 50 mg via INTRAVENOUS

## 2015-12-11 NOTE — Transfer of Care (Signed)
Immediate Anesthesia Transfer of Care Note  Patient: Kirk Bennett.  Procedure(s) Performed: Procedure(s): COLONOSCOPY WITH PROPOFOL (N/A)  Patient Location: PACU  Anesthesia Type:General  Level of Consciousness: sedated  Airway & Oxygen Therapy: Patient Spontanous Breathing and Patient connected to face mask oxygen  Post-op Assessment: Report given to RN and Post -op Vital signs reviewed and stable  Post vital signs: Reviewed and stable  Last Vitals:  Filed Vitals:   12/11/15 1033  BP: 126/81  Pulse: 78  Temp: 35.8 C  Resp: 18    Complications: No apparent anesthesia complications

## 2015-12-11 NOTE — Anesthesia Preprocedure Evaluation (Signed)
Anesthesia Evaluation  Patient identified by MRN, date of birth, ID band Patient awake    Reviewed: Allergy & Precautions, NPO status , Patient's Chart, lab work & pertinent test results  Airway Mallampati: II  TM Distance: >3 FB Neck ROM: Full    Dental no notable dental hx.    Pulmonary neg pulmonary ROS,    Pulmonary exam normal        Cardiovascular hypertension, Pt. on medications Normal cardiovascular exam+ dysrhythmias Supra Ventricular Tachycardia      Neuro/Psych Seizures -, Well Controlled,  PSYCHIATRIC DISORDERS Depression    GI/Hepatic negative GI ROS, Neg liver ROS,   Endo/Other  diabetes, Well Controlled, Type 2, Oral Hypoglycemic Agents  Renal/GU Renal InsufficiencyRenal disease  negative genitourinary   Musculoskeletal  (+) Arthritis , Osteoarthritis,    Abdominal Normal abdominal exam  (+)   Peds negative pediatric ROS (+)  Hematology negative hematology ROS (+)   Anesthesia Other Findings L total knee replacement  Reproductive/Obstetrics                             Anesthesia Physical Anesthesia Plan  ASA: III  Anesthesia Plan: General   Post-op Pain Management:    Induction: Intravenous  Airway Management Planned: Nasal Cannula  Additional Equipment:   Intra-op Plan:   Post-operative Plan:   Informed Consent: I have reviewed the patients History and Physical, chart, labs and discussed the procedure including the risks, benefits and alternatives for the proposed anesthesia with the patient or authorized representative who has indicated his/her understanding and acceptance.   Dental advisory given  Plan Discussed with: CRNA and Surgeon  Anesthesia Plan Comments:         Anesthesia Quick Evaluation

## 2015-12-11 NOTE — Anesthesia Procedure Notes (Signed)
Date/Time: 12/11/2015 12:00 PM Performed by: Allean Found Pre-anesthesia Checklist: Patient identified, Emergency Drugs available, Suction available, Timeout performed and Patient being monitored Patient Re-evaluated:Patient Re-evaluated prior to inductionOxygen Delivery Method: Nasal cannula Intubation Type: IV induction

## 2015-12-11 NOTE — H&P (Signed)
Outpatient short stay form Pre-procedure 12/11/2015 11:55 AM Lollie Sails MD  Primary Physician: Dr. Danise Mina  Reason for visit:  Colonoscopy  History of present illness:  Patient is a 65 year old male presenting today for colonoscopy. His last colonoscopy was about 10 years ago. There were some polyps at that time but apparently they were hyperplastic. He had an episode of a constipation about 6 months ago. He had a CT at that time that showed no evidence of obstruction or ileus and no acute process. Colon was stated as nondistended. This has not been recurrent since that time.  He tolerated his prep well. He takes no aspirin products or blood thinners.    Current facility-administered medications:  .  0.9 %  sodium chloride infusion, , Intravenous, Continuous, Lollie Sails, MD, Last Rate: 20 mL/hr at 12/11/15 1053 .  0.9 %  sodium chloride infusion, , Intravenous, Continuous, Lollie Sails, MD  Prescriptions prior to admission  Medication Sig Dispense Refill Last Dose  . b complex vitamins tablet Take 1 tablet by mouth daily.   Past Week at Unknown time  . Cholecalciferol (VITAMIN D3) 1000 UNITS CAPS Take 1 capsule (1,000 Units total) by mouth daily. 30 capsule  Past Week at Unknown time  . atorvastatin (LIPITOR) 20 MG tablet Take 1 tablet (20 mg total) by mouth daily. 90 tablet 3 Taking  . azithromycin (ZITHROMAX) 250 MG tablet Take two tablets on day one followed by one tablet on days 2-5 6 each 0   . Dapagliflozin Propanediol (FARXIGA) 5 MG TABS Take 1 tablet by mouth daily.   Taking  . glimepiride (AMARYL) 2 MG tablet Take 2 mg by mouth every other day.   Taking  . LamoTRIgine 300 MG TB24 Take 1 capsule by mouth every evening.   Taking  . levETIRAcetam (KEPPRA) 750 MG tablet Take 2 tablets (1,500 mg total) by mouth 2 (two) times daily.     Marland Kitchen lisinopril (PRINIVIL,ZESTRIL) 10 MG tablet Take 1 tablet (10 mg total) by mouth 2 (two) times daily. 180 tablet 3 Taking  .  metFORMIN (GLUCOPHAGE) 1000 MG tablet TAKE 1 TABLET BY MOUTH TWICE A DAY WITH A MEAL 180 tablet 3 Taking  . ONE TOUCH ULTRA TEST test strip TEST BLOOD SUGAR FASTING IN THE MORNING AND 2 HOURS AFTER LUNCH OR DINNER 100 each 3 Taking  . tamsulosin (FLOMAX) 0.4 MG CAPS capsule Take 1 capsule (0.4 mg total) by mouth daily. 90 capsule 1 Taking  . VIRTUSSIN A/C 100-10 MG/5ML syrup TAKE 5 ML BY MOUTH TWICE DAILY AS NEEDED COUGH 140 mL 0      No Known Allergies   Past Medical History  Diagnosis Date  . 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 Gabriel Carina, endo  . HTN (hypertension)   . HLD (hyperlipidemia)   . 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 Mora Bellman at [facility])  . Knee pain     s/p replacement  . History of hepatitis A   . SVT (supraventricular tachycardia) ([place]) 1996  . Cataracts, bilateral 02/2011    and suspected glaucoma, to return for f/u, no diabetic retinopathy  . Depression     found by neuro  . Glaucoma 2015  . CKD stage 3 due to type 2 diabetes mellitus ([place]) 2015    normal renal US, self referred to Dr  Lateef  . Vitamin D deficiency 03/02/2015    Review of systems:      Physical Exam    Heart and lungs: Regular rate and rhythm without rub or gallop, lungs are bilaterally clear    HEENT: Normocephalic atraumatic eyes are anicteric    Other:     Pertinant exam for procedure: Protuberant soft nontender nondistended bowel sounds are positive    Planned proceedures: Colonoscopy and indicated procedures. I have discussed the risks benefits and complications of procedures to include not limited to bleeding, infection, perforation and the risk of sedation and the patient wishes to proceed.    Lollie Sails, MD Gastroenterology 12/11/2015  11:55 AM

## 2015-12-11 NOTE — Op Note (Signed)
[facility] Gastroenterology Patient Name: Kirk Bennett Procedure Date: 12/11/2015 11:51 AM MRN: [id number] Account #: [id number] Date of Birth: 07/02/1951 Admit Type: Outpatient Age: 65 Room: [facility] ENDO ROOM 3 Gender: Male Note Status: Finalized Procedure:            Colonoscopy Indications:          Personal history of colonic polyps Providers:            Lollie Sails, MD Referring MD:         Ria Bush (Referring MD) Medicines:            Monitored Anesthesia Care Complications:        No immediate complications. Procedure:            Pre-Anesthesia Assessment:                       - ASA Grade Assessment: III - A patient with severe                        systemic disease.                       After obtaining informed consent, the colonoscope was                        passed under direct vision. Throughout the procedure,                        the patient's blood pressure, pulse, and oxygen                        saturations were monitored continuously. The                        Colonoscope was introduced through the anus and                        advanced to the the terminal ileum. The colonoscopy was                        performed with moderate difficulty. Successful                        completion of the procedure was aided by lavage. Findings:      A 2 mm polyp was found in the descending colon. The polyp was sessile.       The polyp was removed with a cold biopsy forceps. Resection and       retrieval were complete.      Two pedunculated and sessile polyps were found in the descending colon.       The polyps were 3 to 4 mm in size. These polyps were removed with a cold       snare. Resection and retrieval were complete.      Four sessile polyps were found in the descending colon. The polyps were       1 to 3 mm in size. These polyps were removed with a cold biopsy forceps.       Resection and retrieval were complete.      A 4 mm  polyp was found in the ascending colon. The polyp was  semi-pedunculated. The polyp was removed with a cold snare. Resection       and retrieval were complete.      Two flat and sessile polyps were found in the cecum. The polyps were 1       to 3 mm in size. These polyps were removed with a cold biopsy forceps.       Resection and retrieval were complete.      Two sessile polyps were found in the sigmoid colon. The polyps were 1 to       2 mm in size. These polyps were removed with a cold biopsy forceps.       Resection and retrieval were complete.      A 2 mm polyp was found in the rectum. The polyp was sessile. The polyp       was removed with a cold biopsy forceps. Resection and retrieval were       complete.      Multiple small-mouthed diverticula were found in the sigmoid colon and       distal descending colon.      The digital rectal exam was normal. Impression:           - One 2 mm polyp in the descending colon, removed with                        a cold biopsy forceps. Resected and retrieved.                       - Two 3 to 4 mm polyps in the descending colon, removed                        with a cold snare. Resected and retrieved.                       - Four 1 to 3 mm polyps in the descending colon,                        removed with a cold biopsy forceps. Resected and                        retrieved.                       - One 4 mm polyp in the ascending colon, removed with a                        cold snare. Resected and retrieved.                       - Two 1 to 3 mm polyps in the cecum, removed with a                        cold biopsy forceps. Resected and retrieved.                       - Two 1 to 2 mm polyps in the sigmoid colon, removed                        with a cold biopsy forceps. Resected and retrieved.                       -  One 2 mm polyp in the rectum, removed with a cold                        biopsy forceps. Resected and retrieved.                        - Diverticulosis in the sigmoid colon and in the distal                        descending colon. Recommendation:       - Discharge patient to home.                       - Telephone GI clinic for pathology results in 1 week.                       - Full liquid diet today.                       - Soft diet for 3 days. Procedure Code(s):    --- Professional ---                       [phone], Colonoscopy, flexible; with removal of tumor(s),                        polyp(s), or other lesion(s) by snare technique                       [id number], 66, Colonoscopy, flexible; with biopsy, single                        or multiple Diagnosis Code(s):    --- Professional ---                       D12.4, Benign neoplasm of descending colon                       D12.2, Benign neoplasm of ascending colon                       K62.1, Rectal polyp                       D12.0, Benign neoplasm of cecum                       D12.5, Benign neoplasm of sigmoid colon                       Z86.010, Personal history of colonic polyps                       K57.30, Diverticulosis of large intestine without                        perforation or abscess without bleeding CPT copyright 2016 American Medical Association. All rights reserved. The codes documented in this report are preliminary and upon coder review may  be revised to meet current compliance requirements. Lollie Sails, MD 12/11/2015 12:50:27 PM This report has been signed electronically. Number of Addenda: 0 Note Initiated On: 12/11/2015 11:51 AM Scope Withdrawal  Time: 0 hours 17 minutes 34 seconds  Total Procedure Duration: 0 hours 38 minutes 20 seconds       [facility]

## 2015-12-11 NOTE — Anesthesia Postprocedure Evaluation (Signed)
Anesthesia Post Note  Patient: Kirk Bennett.  Procedure(s) Performed: Procedure(s) (LRB): COLONOSCOPY WITH PROPOFOL (N/A)  Patient location during evaluation: PACU Anesthesia Type: General Level of consciousness: awake and alert and oriented Pain management: pain level controlled Vital Signs Assessment: post-procedure vital signs reviewed and stable Respiratory status: spontaneous breathing Cardiovascular status: blood pressure returned to baseline Anesthetic complications: no    Last Vitals:  Filed Vitals:   12/11/15 1311 12/11/15 1320  BP: 89/74 123/70  Pulse: 80 80  Temp:    Resp: 18 19    Last Pain: There were no vitals filed for this visit.               Kenwood Rosiak

## 2015-12-14 LAB — HM DIABETES EYE EXAM

## 2015-12-14 LAB — SURGICAL PATHOLOGY

## 2015-12-15 ENCOUNTER — Encounter: Payer: Self-pay | Admitting: Family Medicine

## 2015-12-16 ENCOUNTER — Encounter: Payer: Self-pay | Admitting: Gastroenterology

## 2015-12-19 ENCOUNTER — Encounter: Payer: Self-pay | Admitting: Family Medicine

## 2016-01-16 ENCOUNTER — Encounter: Payer: Self-pay | Admitting: Family Medicine

## 2016-01-22 ENCOUNTER — Ambulatory Visit: Payer: 59 | Admitting: Sports Medicine

## 2016-01-29 ENCOUNTER — Ambulatory Visit (INDEPENDENT_AMBULATORY_CARE_PROVIDER_SITE_OTHER): Payer: 59 | Admitting: Sports Medicine

## 2016-01-29 ENCOUNTER — Encounter: Payer: Self-pay | Admitting: Sports Medicine

## 2016-01-29 ENCOUNTER — Ambulatory Visit: Payer: Self-pay

## 2016-01-29 DIAGNOSIS — M79671 Pain in right foot: Secondary | ICD-10-CM

## 2016-01-29 DIAGNOSIS — M779 Enthesopathy, unspecified: Secondary | ICD-10-CM

## 2016-01-29 DIAGNOSIS — E1142 Type 2 diabetes mellitus with diabetic polyneuropathy: Secondary | ICD-10-CM

## 2016-01-29 DIAGNOSIS — G40209 Localization-related (focal) (partial) symptomatic epilepsy and epileptic syndromes with complex partial seizures, not intractable, without status epilepticus: Secondary | ICD-10-CM | POA: Insufficient documentation

## 2016-01-29 MED ORDER — METHYLPREDNISOLONE 4 MG PO TBPK: ORAL_TABLET | ORAL | Status: DC

## 2016-01-29 MED ORDER — TRIAMCINOLONE ACETONIDE 10 MG/ML IJ SUSP: 10.0000 mg | Freq: Once | INTRAMUSCULAR | Status: DC

## 2016-01-29 NOTE — Patient Instructions (Signed)
[place] Healthy Feet cream

## 2016-01-29 NOTE — Progress Notes (Signed)
Patient ID: Kirk Buttery., male   DOB: 08-22-1951, 65 y.o.   MRN: [id number] Subjective: Kirk Felmlee. is a 65 y.o. Diabetic male patient who presents to office for evaluation of Right foot pain. Patient states that 1 week ago started having pain that feels like his foot is broke in the middle. States that the only thing different that he did was get new inserts. Patient denies any other pedal complaints.   FBS 130  Patient Active Problem List   Diagnosis Date Noted  . Partial epilepsy with impairment of consciousness ([place]) 01/29/2016  . Viral URI with cough 10/15/2015  . Constipation 07/13/2015  . Toe pain, left 07/13/2015  . Low vitamin B12 level 05/27/2015  . Elevated PSA 05/27/2015  . Type 2 diabetes mellitus ([place]) 03/09/2015  . Unsteadiness on feet 03/02/2015  . Vitamin D deficiency 03/02/2015  . Urinary frequency 11/07/2014  . CKD stage 3 due to type 2 diabetes mellitus ([place])   . Incomplete emptying of bladder 04/23/2014  . Other long term (current) drug therapy 03/12/2012  . Healthcare maintenance 04/26/2011  . TOTAL KNEE REPLACEMENT, LEFT, HX OF 04/07/2010  . Controlled type 2 diabetes mellitus with diabetic nephropathy ([place]) 03/30/2010  . Hyperlipidemia 03/30/2010  . 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 Essential hypertension, benign 03/30/2010    Current Outpatient Prescriptions on File Prior to Visit  Medication Sig Dispense Refill  . atorvastatin (LIPITOR) 20 MG tablet Take 1 tablet (20 mg total) by mouth daily. 90 tablet 3  . azithromycin (ZITHROMAX) 250 MG tablet Take two tablets on day one followed by one tablet on days 2-5 6 each 0  . b complex vitamins tablet Take 1 tablet by mouth daily.    . Cholecalciferol (VITAMIN D3) 1000 UNITS CAPS Take 1 capsule (1,000 Units total) by mouth daily. 30 capsule   . Dapagliflozin Propanediol (FARXIGA) 5 MG TABS Take 1 tablet by mouth daily.    Marland Kitchen glimepiride (AMARYL) 2 MG  tablet Take 2 mg by mouth every other day.    . LamoTRIgine 300 MG TB24 Take 1 capsule by mouth every evening.    . levETIRAcetam (KEPPRA) 750 MG tablet Take 2 tablets (1,500 mg total) by mouth 2 (two) times daily.    Marland Kitchen lisinopril (PRINIVIL,ZESTRIL) 10 MG tablet Take 1 tablet (10 mg total) by mouth 2 (two) times daily. 180 tablet 3  . metFORMIN (GLUCOPHAGE) 1000 MG tablet TAKE 1 TABLET BY MOUTH TWICE A DAY WITH A MEAL 180 tablet 3  . ONE TOUCH ULTRA TEST test strip TEST BLOOD SUGAR FASTING IN THE MORNING AND 2 HOURS AFTER LUNCH OR DINNER 100 each 3  . tamsulosin (FLOMAX) 0.4 MG CAPS capsule Take 1 capsule (0.4 mg total) by mouth daily. 90 capsule 1  . VIRTUSSIN A/C 100-10 MG/5ML syrup TAKE 5 ML BY MOUTH TWICE DAILY AS NEEDED COUGH 140 mL 0   No current facility-administered medications on file prior to visit.    No Known Allergies  Objective:  General: Alert and oriented x3 in no acute distress  Dermatology: No open lesions bilateral lower extremities, no webspace macerations, no ecchymosis bilateral, all nails x 10 are short and thickened and mycotic.   Vascular: Dorsalis Pedis and Posterior Tibial pedal pulses palpable, Capillary Fill Time 3 seconds,(+) pedal hair growth bilateral, no edema bilateral lower extremities, Temperature gradient within normal limits.  Neurology: Gross sensation intact via light touch bilateral, Protective sensation intact  with Thornell Mule  Monofilament to all pedal sites, Position sense intact, vibratory diminished bilateral, Deep tendon reflexes within normal limits bilateral, No babinski sign present bilateral. (- )Tinels sign bilateral.   Musculoskeletal: Mild tenderness with palpation at extensor tendons and dorsal midfoot on right foot, Asymptomatic bunion and lesser hammertoe bilateral, Strength within normal limits in all groups bilateral.   Xrays  Right Foot   Impression: Midfoot joint space narrowing, calcaneal heel spur, bunion and hammertoe,  no fracture, no dislocation, no soft tissue swelling.  Assessment and Plan: Problem List Items Addressed This Visit    None    Visit Diagnoses    Right foot pain    -  Primary    Relevant Medications    methylPREDNISolone (MEDROL DOSEPAK) 4 MG TBPK tablet    triamcinolone acetonide (KENALOG) 10 MG/ML injection 10 mg    Other Relevant Orders    DG Foot 2 Views Right    Tendonitis        Relevant Medications    methylPREDNISolone (MEDROL DOSEPAK) 4 MG TBPK tablet    triamcinolone acetonide (KENALOG) 10 MG/ML injection 10 mg    Diabetic polyneuropathy associated with type 2 diabetes mellitus (HCC)           -Complete examination performed -Xrays reviewed -Discussed treatement options for likely tendonitis and arthritis -After oral consent and aseptic prep, injected a mixture containing 1 ml of 2%  plain lidocaine, 1 ml 0.5% plain marcaine, 0.5 ml of kenalog 10 and 0.5 ml of dexamethasone phosphate into right dorsal midfoot without complication. Post-injection care discussed with patient.  -Rx Medrol dose pack and once finished tylenol -Recommend ice and elevation -Patient to return to office in 2 weeks will re-xrays and further work up for concern for [place]cot if no improvement or sooner if condition worsens.  Landis Martins, DPM

## 2016-02-01 ENCOUNTER — Telehealth: Payer: Self-pay

## 2016-02-01 DIAGNOSIS — E1122 Type 2 diabetes mellitus with diabetic chronic kidney disease: Secondary | ICD-10-CM

## 2016-02-01 DIAGNOSIS — E785 Hyperlipidemia, unspecified: Secondary | ICD-10-CM

## 2016-02-01 DIAGNOSIS — E1121 Type 2 diabetes mellitus with diabetic nephropathy: Secondary | ICD-10-CM

## 2016-02-01 DIAGNOSIS — N183 Chronic kidney disease, stage 3 (moderate): Secondary | ICD-10-CM

## 2016-02-01 DIAGNOSIS — Z1159 Encounter for screening for other viral diseases: Secondary | ICD-10-CM

## 2016-02-01 NOTE — Telephone Encounter (Signed)
Pt has 6 month f/u appt on 03/11/2016 with Dr Darnell Level. Pt had annual exam with labs 08/2015. Pt wants to know if needs to have labs prior to coming for the 03/11/16 visit; pt is diabetic. Pt request cb.

## 2016-02-02 NOTE — Telephone Encounter (Signed)
Labs ordered may come in prior to next appointment.  Ask him to remind lab if he needs it sent to a specific lab.

## 2016-02-02 NOTE — Telephone Encounter (Signed)
Patient notified and lab appt scheduled.  

## 2016-02-12 ENCOUNTER — Encounter: Payer: Self-pay | Admitting: Sports Medicine

## 2016-02-12 ENCOUNTER — Ambulatory Visit (INDEPENDENT_AMBULATORY_CARE_PROVIDER_SITE_OTHER): Payer: 59 | Admitting: Sports Medicine

## 2016-02-12 DIAGNOSIS — E1142 Type 2 diabetes mellitus with diabetic polyneuropathy: Secondary | ICD-10-CM | POA: Diagnosis not present

## 2016-02-12 DIAGNOSIS — M779 Enthesopathy, unspecified: Secondary | ICD-10-CM | POA: Diagnosis not present

## 2016-02-12 DIAGNOSIS — M79671 Pain in right foot: Secondary | ICD-10-CM

## 2016-02-12 NOTE — Progress Notes (Signed)
Patient ID: [name], male   DOB: 1951-02-27, 65 y.o.   MRN: [id number]  Subjective: Laikyn Daman. is a 65 y.o. Diabetic male patient who returns to office for evaluation of Right foot pain. Patient had injection last visit and was started on medrol dose pack with no problems. Reports he has no pain or symptoms. Reports that he feels better. Patient denies any other pedal complaints.   FBS 103  Patient Active Problem List   Diagnosis Date Noted  . Partial epilepsy with impairment of consciousness ([place]) 01/29/2016  . Constipation 07/13/2015  . Toe pain, left 07/13/2015  . Low vitamin B12 level 05/27/2015  . Elevated PSA 05/27/2015  . Type 2 diabetes mellitus ([place]) 03/09/2015  . Unsteadiness on feet 03/02/2015  . Vitamin D deficiency 03/02/2015  . Urinary frequency 11/07/2014  . CKD stage 3 due to type 2 diabetes mellitus ([place])   . Incomplete emptying of bladder 04/23/2014  . Other long term (current) drug therapy 03/12/2012  . Healthcare maintenance 04/26/2011  . TOTAL KNEE REPLACEMENT, LEFT, HX OF 04/07/2010  . Controlled type 2 diabetes mellitus with diabetic nephropathy ([place]) 03/30/2010  . Hyperlipidemia 03/30/2010  . 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 Essential hypertension, benign 03/30/2010    Current Outpatient Prescriptions on File Prior to Visit  Medication Sig Dispense Refill  . atorvastatin (LIPITOR) 20 MG tablet Take 1 tablet (20 mg total) by mouth daily. 90 tablet 3  . azithromycin (ZITHROMAX) 250 MG tablet Take two tablets on day one followed by one tablet on days 2-5 6 each 0  . b complex vitamins tablet Take 1 tablet by mouth daily.    . Cholecalciferol (VITAMIN D3) 1000 UNITS CAPS Take 1 capsule (1,000 Units total) by mouth daily. 30 capsule   . Dapagliflozin Propanediol (FARXIGA) 5 MG TABS Take 1 tablet by mouth daily.    Marland Kitchen glimepiride (AMARYL) 2 MG tablet Take 2 mg by mouth every other day.    .  LamoTRIgine 300 MG TB24 Take 1 capsule by mouth every evening.    . levETIRAcetam (KEPPRA) 750 MG tablet Take 2 tablets (1,500 mg total) by mouth 2 (two) times daily.    Marland Kitchen lisinopril (PRINIVIL,ZESTRIL) 10 MG tablet Take 1 tablet (10 mg total) by mouth 2 (two) times daily. 180 tablet 3  . metFORMIN (GLUCOPHAGE) 1000 MG tablet TAKE 1 TABLET BY MOUTH TWICE A DAY WITH A MEAL 180 tablet 3  . methylPREDNISolone (MEDROL DOSEPAK) 4 MG TBPK tablet Take as instructed for Right foot inflammation 21 tablet 0  . ONE TOUCH ULTRA TEST test strip TEST BLOOD SUGAR FASTING IN THE MORNING AND 2 HOURS AFTER LUNCH OR DINNER 100 each 3  . tamsulosin (FLOMAX) 0.4 MG CAPS capsule Take 1 capsule (0.4 mg total) by mouth daily. 90 capsule 1  . VIRTUSSIN A/C 100-10 MG/5ML syrup TAKE 5 ML BY MOUTH TWICE DAILY AS NEEDED COUGH 140 mL 0   Current Facility-Administered Medications on File Prior to Visit  Medication Dose Route Frequency Provider Last Rate Last Dose  . triamcinolone acetonide (KENALOG) 10 MG/ML injection 10 mg  10 mg Other Once [employer], DPM        No Known Allergies  Objective:  General: Alert and oriented x3 in no acute distress  Dermatology: No open lesions bilateral lower extremities, no webspace macerations, no ecchymosis bilateral, all nails x 10 are short and thickened and mycotic.   Vascular: Dorsalis Pedis and Posterior  Tibial pedal pulses palpable, Capillary Fill Time 3 seconds,(+) pedal hair growth bilateral, no edema bilateral lower extremities, Temperature gradient within normal limits.  Neurology: Johney Maine sensation intact via light touch bilateral, Protective sensation intact  with Thornell Mule Monofilament to all pedal sites, Position sense intact, vibratory diminished bilateral, Deep tendon reflexes within normal limits bilateral, No babinski sign present bilateral. (- )Tinels sign bilateral.   Musculoskeletal: No tenderness with palpation at extensor tendons and dorsal midfoot on  right foot, Asymptomatic bunion and lesser hammertoe bilateral, Strength within normal limits in all groups bilateral.   Assessment and Plan: Problem List Items Addressed This Visit    None    Visit Diagnoses    Right foot pain    -  Primary    Tendonitis        Diabetic polyneuropathy associated with type 2 diabetes mellitus (HCC)           -Complete examination performed -Discussed continued plan of care for tendonitis and arthritis -Recommend ice and elevation as needed if symptoms recur  -Recommend good supportive shoes daily -Patient to return to office in 4 weeks for follow up evaluation/diabetic foot care or sooner if condition worsens.  Landis Martins, DPM

## 2016-03-04 ENCOUNTER — Other Ambulatory Visit (INDEPENDENT_AMBULATORY_CARE_PROVIDER_SITE_OTHER): Payer: No Typology Code available for payment source

## 2016-03-04 DIAGNOSIS — N183 Chronic kidney disease, stage 3 (moderate): Secondary | ICD-10-CM

## 2016-03-04 DIAGNOSIS — E785 Hyperlipidemia, unspecified: Secondary | ICD-10-CM

## 2016-03-04 DIAGNOSIS — Z1159 Encounter for screening for other viral diseases: Secondary | ICD-10-CM

## 2016-03-04 DIAGNOSIS — E1122 Type 2 diabetes mellitus with diabetic chronic kidney disease: Secondary | ICD-10-CM

## 2016-03-04 DIAGNOSIS — E1121 Type 2 diabetes mellitus with diabetic nephropathy: Secondary | ICD-10-CM

## 2016-03-04 LAB — HEMOGLOBIN A1C
Hgb A1c MFr Bld: 6 % — ABNORMAL HIGH (ref <5.7)
Mean Plasma Glucose: 126 mg/dL

## 2016-03-04 LAB — RENAL FUNCTION PANEL
ALBUMIN: 3.9 g/dL (ref 3.6–5.1)
BUN: 15 mg/dL (ref 7–25)
CALCIUM: 9.4 mg/dL (ref 8.6–10.3)
CO2: 22 mmol/L (ref 20–31)
Chloride: 103 mmol/L (ref 98–110)
Creat: 1.39 mg/dL — ABNORMAL HIGH (ref 0.70–1.25)
GLUCOSE: 141 mg/dL — AB (ref 65–99)
Phosphorus: 3.2 mg/dL (ref 2.5–4.5)
Potassium: 4.4 mmol/L (ref 3.5–5.3)
Sodium: 141 mmol/L (ref 135–146)

## 2016-03-04 LAB — LDL CHOLESTEROL, DIRECT: Direct LDL: 65 mg/dL (ref <130)

## 2016-03-05 LAB — HEPATITIS C ANTIBODY: HCV Ab: NEGATIVE

## 2016-03-11 ENCOUNTER — Ambulatory Visit (INDEPENDENT_AMBULATORY_CARE_PROVIDER_SITE_OTHER): Payer: No Typology Code available for payment source | Admitting: Family Medicine

## 2016-03-11 ENCOUNTER — Encounter: Payer: Self-pay | Admitting: Family Medicine

## 2016-03-11 VITALS — BP 134/80 | HR 88 | Temp 97.9°F | Wt 241.0 lb

## 2016-03-11 DIAGNOSIS — E1122 Type 2 diabetes mellitus with diabetic chronic kidney disease: Secondary | ICD-10-CM | POA: Diagnosis not present

## 2016-03-11 DIAGNOSIS — I10 Essential (primary) hypertension: Secondary | ICD-10-CM | POA: Diagnosis not present

## 2016-03-11 DIAGNOSIS — E1121 Type 2 diabetes mellitus with diabetic nephropathy: Secondary | ICD-10-CM | POA: Diagnosis not present

## 2016-03-11 DIAGNOSIS — E785 Hyperlipidemia, unspecified: Secondary | ICD-10-CM | POA: Diagnosis not present

## 2016-03-11 DIAGNOSIS — N183 Chronic kidney disease, stage 3 (moderate): Secondary | ICD-10-CM

## 2016-03-11 DIAGNOSIS — R972 Elevated prostate specific antigen [PSA]: Secondary | ICD-10-CM

## 2016-03-11 NOTE — Assessment & Plan Note (Signed)
Chronic, stable. Continue lipitor.  

## 2016-03-11 NOTE — Assessment & Plan Note (Signed)
Chronic, stable. Continue current regimen. 

## 2016-03-11 NOTE — Patient Instructions (Addendum)
Good job with weight loss to date and sugar control! Continue current medicines. Return as needed or in 6 months for physical.

## 2016-03-11 NOTE — Progress Notes (Signed)
BP 134/80 mmHg  Pulse 88  Temp(Src) 97.9 F (36.6 C) (Oral)  Wt 241 lb (109.317 kg)   CC: 6 mo f/u visit  Subjective:    Patient ID: Kirk Buttery., male    DOB: 1951-06-07, 65 y.o.   MRN: [id number]  HPI: Kirk Bennett. is a 65 y.o. male presenting on 03/11/2016 for Follow-up   DM - regularly does check sugars 2-3 times daily. Prior saw Dr Gabriel Carina but has decided to continue DM f/u at our office. Compliant with antihyperglycemic regimen which includes: metformin 1000mg  bid, amaryl 2mg  daily, farxiga 5mg  daily. No yeast or UTI sxs recently. Denies low sugars or hypoglycemic symptoms. Denies paresthesias. Last diabetic eye exam 11/2015.  Pneumovax: 05/2012.  Prevnar: not due. Active at pool several times a week.  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 On lisinopril 10mg  bid.  HLD - compliant with lipitor 20mg  daily without myalgias. Seizure disorder - followed by neurologist, on lamotrigine 300mg  daily and keppra 1500mg  bid.  Elevated PSA - decided to undergo serial monitoring with Dr Karsten Ro urology. On flomax.  Lab Results  Component Value Date   PSA 4.43* 09/07/2015   PSA 4.47* 05/22/2015   PSA 2.38 05/18/2011    Relevant past medical, surgical, family and social history reviewed and updated as indicated. Interim medical history since our last visit reviewed. Allergies and medications reviewed and updated. Current Outpatient Prescriptions on File Prior to Visit  Medication Sig  . atorvastatin  (LIPITOR) 20 MG tablet Take 1 tablet (20 mg total) by mouth daily.  Marland Kitchen b complex vitamins tablet Take 1 tablet by mouth daily.  . Cholecalciferol (VITAMIN D3) 1000 UNITS CAPS Take 1 capsule (1,000 Units total) by mouth daily.  . Dapagliflozin Propanediol (FARXIGA) 5 MG TABS Take 1 tablet by mouth daily.  Marland Kitchen glimepiride (AMARYL) 2 MG tablet Take 2 mg by mouth every other day.  . LamoTRIgine 300 MG TB24 Take 1 capsule by mouth every evening.  . levETIRAcetam (KEPPRA) 750 MG tablet Take 2 tablets (1,500 mg total) by mouth 2 (two) times daily.  Marland Kitchen lisinopril (PRINIVIL,ZESTRIL) 10 MG tablet Take 1 tablet (10 mg total) by mouth 2 (two) times daily.  . metFORMIN (GLUCOPHAGE) 1000 MG tablet TAKE 1 TABLET BY MOUTH TWICE A DAY WITH A MEAL  . ONE TOUCH ULTRA TEST test strip TEST BLOOD SUGAR FASTING IN THE MORNING AND 2 HOURS AFTER LUNCH OR DINNER  . tamsulosin (FLOMAX) 0.4 MG CAPS capsule Take 1 capsule (0.4 mg total) by mouth daily.   Current Facility-Administered Medications on File Prior to Visit  Medication  . triamcinolone acetonide (KENALOG) 10 MG/ML injection 10 mg    Review of Systems Per HPI unless specifically indicated in ROS section     Objective:    BP 134/80 mmHg  Pulse 88  Temp(Src) 97.9 F (36.6 C) (Oral)  Wt 241 lb (109.317 kg)  Wt Readings from Last 3 Encounters:  03/11/16 241  lb (109.317 kg)  12/11/15 245 lb (111.131 kg)  10/15/15 245 lb 12 oz (111.471 kg)    Physical Exam  Constitutional: He appears well-developed and well-nourished. No distress.  HENT:  Head: Normocephalic and atraumatic.  Right Ear: External ear normal.  Left Ear: External ear normal.  Nose: Nose normal.  Mouth/Throat: Oropharynx is clear and moist. No oropharyngeal exudate.  Eyes: Conjunctivae and EOM are normal. Pupils are equal, round, and reactive to light. No scleral icterus.  Neck: Normal range of motion. Neck supple.  Cardiovascular: Normal rate, regular rhythm, normal heart sounds and intact  distal pulses.   No murmur heard. Pulmonary/Chest: Effort normal and breath sounds normal. No respiratory distress. He has no wheezes. He has no rales.  Musculoskeletal: He exhibits no edema.  See HPI for foot exam if done  Lymphadenopathy:    He has no cervical adenopathy.  Skin: Skin is warm and dry. No rash noted.  Psychiatric: He has a normal mood and affect.  Nursing note and vitals reviewed.  Results for orders placed or performed in visit on 03/04/16  Hemoglobin A1c  Result Value Ref Range   Hgb A1c MFr Bld 6.0 (H) <5.7 %   Mean Plasma Glucose 126 mg/dL  Renal function panel  Result Value Ref Range   Sodium 141 135 - 146 mmol/L   Potassium 4.4 3.5 - 5.3 mmol/L   Chloride 103 98 - 110 mmol/L   CO2 22 20 - 31 mmol/L   Glucose, Bld 141 (H) 65 - 99 mg/dL   BUN 15 7 - 25 mg/dL   Creat 1.39 (H) 0.70 - 1.25 mg/dL   Albumin 3.9 3.6 - 5.1 g/dL   Calcium 9.4 8.6 - 10.3 mg/dL   Phosphorus 3.2 2.5 - 4.5 mg/dL  LDL cholesterol, direct  Result Value Ref Range   Direct LDL 65 <130 mg/dL  Hepatitis C antibody  Result Value Ref Range   HCV Ab NEGATIVE NEGATIVE      Assessment & Plan:   Problem List Items Addressed This Visit    CKD stage 3 due to type 2 diabetes mellitus (HCC)    Chronic, stable. Continue to monitor. Continue ACEI.      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 Great control continue curren tregimen.       Elevated PSA    Has established with Dr Karsten Ro. Appreciate his care.       Essential hypertension, benign    Chronic, stable. Continue current regimen.       Hyperlipidemia    Chronic, stable. Continue lipitor.           Follow up plan: Return in about 6 months (around 09/11/2016), or as needed, for annual exam, prior fasting for blood work.  Ria Bush, MD

## 2016-03-11 NOTE — Assessment & Plan Note (Signed)
Has established with Dr Karsten Ro. Appreciate his care.

## 2016-03-11 NOTE — Assessment & Plan Note (Signed)
Chronic, stable. Continue to monitor. Continue ACEI.

## 2016-03-11 NOTE — Assessment & Plan Note (Signed)
Chronic, stable. Great control continue curren tregimen.

## 2016-03-11 NOTE — Progress Notes (Signed)
Pre visit review using our clinic review tool, if applicable. No additional management support is needed unless otherwise documented below in the visit note. 

## 2016-03-15 ENCOUNTER — Ambulatory Visit: Payer: 59 | Admitting: Sports Medicine

## 2016-03-23 ENCOUNTER — Other Ambulatory Visit: Payer: Self-pay | Admitting: Family Medicine

## 2016-03-28 ENCOUNTER — Other Ambulatory Visit: Payer: Self-pay | Admitting: Family Medicine

## 2016-04-13 ENCOUNTER — Encounter: Payer: Self-pay | Admitting: Family Medicine

## 2016-04-13 ENCOUNTER — Other Ambulatory Visit: Payer: Self-pay | Admitting: Family Medicine

## 2016-04-13 ENCOUNTER — Ambulatory Visit (INDEPENDENT_AMBULATORY_CARE_PROVIDER_SITE_OTHER): Payer: No Typology Code available for payment source | Admitting: Family Medicine

## 2016-04-13 VITALS — BP 134/60 | HR 82 | Temp 98.0°F | Wt 245.0 lb

## 2016-04-13 DIAGNOSIS — R059 Cough, unspecified: Secondary | ICD-10-CM

## 2016-04-13 DIAGNOSIS — R05 Cough: Secondary | ICD-10-CM

## 2016-04-13 MED ORDER — GUAIFENESIN-CODEINE 100-10 MG/5ML PO SYRP: 5.0000 mL | ORAL_SOLUTION | Freq: Two times a day (BID) | ORAL | 0 refills | Status: DC | PRN

## 2016-04-13 MED ORDER — FLUTICASONE PROPIONATE 50 MCG/ACT NA SUSP: 2.0000 | Freq: Every day | NASAL | 1 refills | Status: DC

## 2016-04-13 NOTE — Progress Notes (Signed)
Pre visit review using our clinic review tool, if applicable. No additional management support is needed unless otherwise documented below in the visit note. 

## 2016-04-13 NOTE — Progress Notes (Signed)
BP 134/60   Pulse 82   Temp 98 F (36.7 C) (Oral)   Wt 245 lb (111.1 kg)   SpO2 96%   BMI 32.32 kg/m    CC: cough x 3 wks Subjective:    Patient ID: Kirk Buttery., male    DOB: 1951/07/24, 65 y.o.   MRN: [id number]  HPI: Kirk Aderholt. is a 65 y.o. male presenting on 04/13/2016 for Cough (x 3 weeks; worse when laying down; occasionally productive)   3 wk h/o mild productive cough worse with laying supine. Trouble sleeping at night 2/2 cough. Occasional coughing fits. Mild congestion/rhinorrhea. Mild wheezing and orthopnea. No leg swelling or chest pain or palpitations.   No fevers/chills, ST, PNDrainage, HA.  Denies GERD.  Mild allergic symptoms.   So far has tried tylenol cold sinus med.   Wife with similar sxs.  No h/o asthma.  Not around smokers.   Relevant past medical, surgical, family and social history reviewed and updated as indicated. Interim medical history since our last visit reviewed. Allergies and medications reviewed and updated. Current Outpatient Prescriptions on File Prior to Visit  Medication Sig  . atorvastatin (LIPITOR) 20 MG tablet Take 1 tablet (20 mg total) by mouth daily.  Marland Kitchen b complex vitamins tablet Take 1 tablet by mouth daily.  . Cholecalciferol (VITAMIN D3) 1000 UNITS CAPS Take 1 capsule (1,000 Units total) by mouth daily.  . Dapagliflozin Propanediol (FARXIGA) 5 MG TABS Take 1 tablet by mouth daily.  Marland Kitchen glimepiride (AMARYL) 2 MG tablet Take 2 mg by mouth every other day.  Marland Kitchen glucose blood (ONE TOUCH ULTRA TEST) test strip Use to check sugar fasting in the AM and 2 hours after lunch or supper. Dx: E11.21  . LamoTRIgine 300 MG TB24 Take 1 capsule by mouth every evening.  . levETIRAcetam (KEPPRA) 750 MG tablet Take 2 tablets (1,500 mg total) by mouth 2 (two) times daily.  Marland Kitchen lisinopril (PRINIVIL,ZESTRIL) 10 MG tablet Take 1 tablet (10 mg total) by mouth 2 (two) times daily.  . metFORMIN (GLUCOPHAGE) 1000 MG tablet TAKE 1 TABLET BY MOUTH  TWICE A DAY WITH A MEAL  . tamsulosin (FLOMAX) 0.4 MG CAPS capsule TAKE 1 CAPSULE (0.4 MG TOTAL) BY MOUTH DAILY.   Current Facility-Administered Medications on File Prior to Visit  Medication  . triamcinolone acetonide (KENALOG) 10 MG/ML injection 10 mg    Review of Systems Per HPI unless specifically indicated in ROS section     Objective:    BP 134/60   Pulse 82   Temp 98 F (36.7 C) (Oral)   Wt 245 lb (111.1 kg)   SpO2 96%   BMI 32.32 kg/m   Wt Readings from Last 3 Encounters:  04/13/16 245 lb (111.1 kg)  03/11/16 241 lb (109.3 kg)  12/11/15 245 lb (111.1 kg)    Physical Exam  Constitutional: He appears well-developed and well-nourished. No distress.  HENT:  Head: Normocephalic and atraumatic.  Right Ear: Hearing, tympanic membrane, external ear and ear canal normal.  Left Ear: Hearing, tympanic membrane, external ear and ear canal normal.  Nose: Mucosal edema (nasal mucosal injection) present. No rhinorrhea. Right sinus exhibits no maxillary sinus tenderness and no frontal sinus tenderness. Left sinus exhibits no maxillary sinus tenderness and no frontal sinus tenderness.  Mouth/Throat: Uvula is midline, oropharynx is clear and moist and mucous membranes are normal. No oropharyngeal exudate, posterior oropharyngeal edema, posterior oropharyngeal erythema or tonsillar abscesses.  Eyes: Conjunctivae and EOM are normal.  Pupils are equal, round, and reactive to light. No scleral icterus.  Neck: Normal range of motion. Neck supple.  Cardiovascular: Normal rate, regular rhythm, normal heart sounds and intact distal pulses.   No murmur heard. Pulmonary/Chest: Effort normal. No respiratory distress. He has no wheezes. He has rhonchi in the left middle field. He has no rales.  Coarse rhonchi L mid lung that clears with deep cough/breath  Lymphadenopathy:    He has no cervical adenopathy.  Skin: Skin is warm and dry. No rash noted.  Nursing note and vitals reviewed.  Results  for orders placed or performed in visit on 03/04/16  Hemoglobin A1c  Result Value Ref Range   Hgb A1c MFr Bld 6.0 (H) <5.7 %   Mean Plasma Glucose 126 mg/dL  Renal function panel  Result Value Ref Range   Sodium 141 135 - 146 mmol/L   Potassium 4.4 3.5 - 5.3 mmol/L   Chloride 103 98 - 110 mmol/L   CO2 22 20 - 31 mmol/L   Glucose, Bld 141 (H) 65 - 99 mg/dL   BUN 15 7 - 25 mg/dL   Creat 1.39 (H) 0.70 - 1.25 mg/dL   Albumin 3.9 3.6 - 5.1 g/dL   Calcium 9.4 8.6 - 10.3 mg/dL   Phosphorus 3.2 2.5 - 4.5 mg/dL  LDL cholesterol, direct  Result Value Ref Range   Direct LDL 65 <130 mg/dL  Hepatitis C antibody  Result Value Ref Range   HCV Ab NEGATIVE NEGATIVE      Assessment & Plan:   Problem List Items Addressed This Visit    Cough - Primary    Ongoing x 3 wks. Anticipate post infectious cough. No signs of active bacterial infection. Discussed supportive care.  Treat with flonase, cheratussin cough syrup. Red flags to return discussed.  Pt agrees with plan.        Other Visit Diagnoses   None.      Follow up plan: Return if symptoms worsen or fail to improve.  Ria Bush, MD

## 2016-04-13 NOTE — Assessment & Plan Note (Signed)
Ongoing x 3 wks. Anticipate post infectious cough. No signs of active bacterial infection. Discussed supportive care.  Treat with flonase, cheratussin cough syrup. Red flags to return discussed.  Pt agrees with plan.

## 2016-04-13 NOTE — Patient Instructions (Signed)
I think you have post-infectious or post-viral cough - should continue to improve with time. Treat with plenty of fluids and rest. Start flonase and codeine cough syrup for night time. Watch for fever >101, worsening productive cough instead of improving.

## 2016-05-04 DIAGNOSIS — M79673 Pain in unspecified foot: Secondary | ICD-10-CM

## 2016-06-01 ENCOUNTER — Other Ambulatory Visit: Payer: Self-pay | Admitting: Family Medicine

## 2016-06-08 ENCOUNTER — Other Ambulatory Visit: Payer: Self-pay | Admitting: Family Medicine

## 2016-06-19 ENCOUNTER — Other Ambulatory Visit: Payer: Self-pay | Admitting: Family Medicine

## 2016-06-28 ENCOUNTER — Ambulatory Visit (INDEPENDENT_AMBULATORY_CARE_PROVIDER_SITE_OTHER): Payer: No Typology Code available for payment source

## 2016-06-28 DIAGNOSIS — Z23 Encounter for immunization: Secondary | ICD-10-CM | POA: Diagnosis not present

## 2016-09-04 ENCOUNTER — Other Ambulatory Visit: Payer: Self-pay | Admitting: Family Medicine

## 2016-09-04 DIAGNOSIS — N183 Chronic kidney disease, stage 3 unspecified: Secondary | ICD-10-CM

## 2016-09-04 DIAGNOSIS — E785 Hyperlipidemia, unspecified: Secondary | ICD-10-CM

## 2016-09-04 DIAGNOSIS — R972 Elevated prostate specific antigen [PSA]: Secondary | ICD-10-CM

## 2016-09-04 DIAGNOSIS — E1121 Type 2 diabetes mellitus with diabetic nephropathy: Secondary | ICD-10-CM

## 2016-09-04 DIAGNOSIS — E1122 Type 2 diabetes mellitus with diabetic chronic kidney disease: Secondary | ICD-10-CM

## 2016-09-05 ENCOUNTER — Other Ambulatory Visit (INDEPENDENT_AMBULATORY_CARE_PROVIDER_SITE_OTHER): Payer: 59

## 2016-09-05 DIAGNOSIS — E1121 Type 2 diabetes mellitus with diabetic nephropathy: Secondary | ICD-10-CM

## 2016-09-05 DIAGNOSIS — E785 Hyperlipidemia, unspecified: Secondary | ICD-10-CM

## 2016-09-05 DIAGNOSIS — R972 Elevated prostate specific antigen [PSA]: Secondary | ICD-10-CM

## 2016-09-05 DIAGNOSIS — E1122 Type 2 diabetes mellitus with diabetic chronic kidney disease: Secondary | ICD-10-CM

## 2016-09-05 DIAGNOSIS — N183 Chronic kidney disease, stage 3 (moderate): Secondary | ICD-10-CM

## 2016-09-05 LAB — CBC WITH DIFFERENTIAL/PLATELET
BASOS ABS: 0 {cells}/uL (ref 0–200)
Basophils Relative: 0 %
EOS PCT: 5 %
Eosinophils Absolute: 355 cells/uL (ref 15–500)
HCT: 45.2 % (ref 38.5–50.0)
Hemoglobin: 14.5 g/dL (ref 13.2–17.1)
LYMPHS ABS: 2485 {cells}/uL (ref 850–3900)
Lymphocytes Relative: 35 %
MCH: 27.6 pg (ref 27.0–33.0)
MCHC: 32.1 g/dL (ref 32.0–36.0)
MCV: 85.9 fL (ref 80.0–100.0)
MONOS PCT: 7 %
MPV: 11.1 fL (ref 7.5–12.5)
Monocytes Absolute: 497 cells/uL (ref 200–950)
NEUTROS ABS: 3763 {cells}/uL (ref 1500–7800)
NEUTROS PCT: 53 %
PLATELETS: 185 10*3/uL (ref 140–400)
RBC: 5.26 MIL/uL (ref 4.20–5.80)
RDW: 12.7 % (ref 11.0–15.0)
WBC: 7.1 10*3/uL (ref 3.8–10.8)

## 2016-09-05 NOTE — Addendum Note (Signed)
Addended by: Marchia Bond on: 09/05/2016 07:36 AM   Modules accepted: Orders

## 2016-09-06 LAB — COMPREHENSIVE METABOLIC PANEL
ALBUMIN: 4.1 g/dL (ref 3.6–5.1)
ALT: 11 U/L (ref 9–46)
AST: 11 U/L (ref 10–35)
Alkaline Phosphatase: 111 U/L (ref 40–115)
BILIRUBIN TOTAL: 0.7 mg/dL (ref 0.2–1.2)
BUN: 19 mg/dL (ref 7–25)
CALCIUM: 9.3 mg/dL (ref 8.6–10.3)
CO2: 24 mmol/L (ref 20–31)
Chloride: 105 mmol/L (ref 98–110)
Creat: 1.52 mg/dL — ABNORMAL HIGH (ref 0.70–1.25)
Glucose, Bld: 182 mg/dL — ABNORMAL HIGH (ref 65–99)
POTASSIUM: 4.4 mmol/L (ref 3.5–5.3)
Sodium: 140 mmol/L (ref 135–146)
Total Protein: 6.6 g/dL (ref 6.1–8.1)

## 2016-09-06 LAB — LIPID PANEL
CHOL/HDL RATIO: 3.3 ratio (ref <5.0)
CHOLESTEROL: 112 mg/dL (ref <200)
HDL: 34 mg/dL — AB (ref >40)
LDL Cholesterol: 59 mg/dL (ref <100)
TRIGLYCERIDES: 95 mg/dL (ref <150)
VLDL: 19 mg/dL (ref <30)

## 2016-09-06 LAB — HEMOGLOBIN A1C
Hgb A1c MFr Bld: 7.5 % — ABNORMAL HIGH (ref <5.7)
MEAN PLASMA GLUCOSE: 169 mg/dL

## 2016-09-06 LAB — PSA: PSA: 2.3 ng/mL (ref <=4.0)

## 2016-09-12 ENCOUNTER — Encounter: Payer: Self-pay | Admitting: Family Medicine

## 2016-09-12 ENCOUNTER — Ambulatory Visit (INDEPENDENT_AMBULATORY_CARE_PROVIDER_SITE_OTHER): Payer: 59 | Admitting: Family Medicine

## 2016-09-12 VITALS — BP 118/70 | HR 92 | Temp 98.1°F | Ht 73.25 in | Wt 241.5 lb

## 2016-09-12 DIAGNOSIS — R2681 Unsteadiness on feet: Secondary | ICD-10-CM

## 2016-09-12 DIAGNOSIS — G40109 Localization-related (focal) (partial) symptomatic epilepsy and epileptic syndromes with simple partial seizures, not intractable, without status epilepticus: Secondary | ICD-10-CM

## 2016-09-12 DIAGNOSIS — E785 Hyperlipidemia, unspecified: Secondary | ICD-10-CM | POA: Diagnosis not present

## 2016-09-12 DIAGNOSIS — N183 Chronic kidney disease, stage 3 (moderate): Secondary | ICD-10-CM | POA: Diagnosis not present

## 2016-09-12 DIAGNOSIS — E1121 Type 2 diabetes mellitus with diabetic nephropathy: Secondary | ICD-10-CM

## 2016-09-12 DIAGNOSIS — Z0000 Encounter for general adult medical examination without abnormal findings: Secondary | ICD-10-CM

## 2016-09-12 DIAGNOSIS — R972 Elevated prostate specific antigen [PSA]: Secondary | ICD-10-CM

## 2016-09-12 DIAGNOSIS — E1122 Type 2 diabetes mellitus with diabetic chronic kidney disease: Secondary | ICD-10-CM

## 2016-09-12 DIAGNOSIS — I10 Essential (primary) hypertension: Secondary | ICD-10-CM

## 2016-09-12 LAB — POC URINALSYSI DIPSTICK (AUTOMATED)
BILIRUBIN UA: NEGATIVE
Blood, UA: NEGATIVE
KETONES UA: NEGATIVE
LEUKOCYTES UA: NEGATIVE
NITRITE UA: NEGATIVE
PH UA: 6
PROTEIN UA: NEGATIVE
Spec Grav, UA: 1.02
Urobilinogen, UA: 0.2

## 2016-09-12 MED ORDER — GLIMEPIRIDE 2 MG PO TABS: 2.0000 mg | ORAL_TABLET | Freq: Every day | ORAL | 3 refills | Status: DC

## 2016-09-12 NOTE — Progress Notes (Signed)
Pre visit review using our clinic review tool, if applicable. No additional management support is needed unless otherwise documented below in the visit note. 

## 2016-09-12 NOTE — Assessment & Plan Note (Signed)
Improved off lamictal

## 2016-09-12 NOTE — Progress Notes (Signed)
BP 118/70   Pulse 92   Temp 98.1 F (36.7 C) (Oral)   Ht 6' 1.25" (1.861 m)   Wt 241 lb 8 oz (109.5 kg)   BMI 31.64 kg/m    CC: CPE Subjective:    Patient ID: Kirk Buttery., male    DOB: [birth year], 66 y.o.   MRN: [id number]  HPI: Kirk Boggus. is a 66 y.o. male presenting on 09/12/2016 for Annual Exam   DM - followed by Dr Gabriel Carina. Prior on glimepiride 28m QOD, request change to QD dosing.   Noticing weight loss without trying. Noticing some loosening of stools over last several weeks. Some stool urgency episodes. No fevers, nausea, vomiting, abd pain. No diet changes. No blood in stool or urine.   Preventative: COLONOSCOPY WITH PROPOFOL 12/11/2015 mult polyps, few TA, rpt 363yr(SGustavo LahProstate cancer screening - PSA increased to 4.4 last year, referred to urology with stable DRE and decreased PSA to 2s. Rec yearly monitoring.  Flu shot yearly Tdap - 04/2011 Pneumovax 2013 Shingles shot - deferred for shingrix.  Had hepatitis A at age 10[facility] [place].  No hep B in past. Seat belt use discussed.  Sunscreen use discussed. No suspicious moles on skin. 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  Relevant past medical, surgical, family and social history reviewed and updated as indicated. Interim medical history since our last visit reviewed. Allergies and medications reviewed and updated. Current Outpatient Prescriptions on File Prior to Visit  Medication Sig  . atorvastatin (LIPITOR) 20 MG tablet Take 1 tablet (20 mg total) by mouth daily.  . Marland Kitchen complex vitamins tablet Take 1 tablet by mouth daily.  . Cholecalciferol (VITAMIN D3) 1000 UNITS CAPS Take 1 capsule (1,000 Units total) by mouth daily.  . Dapagliflozin Propanediol (FARXIGA) 5 MG TABS Take 1 tablet by  mouth daily.  . Marland KitchenARXIGA 10 MG TABS tablet TAKE 1 TABLET (10 MG TOTAL) BY MOUTH EVERY MORNING.  . fluticasone (FLONASE) 50 MCG/ACT nasal spray Place 2 sprays into both nostrils daily.  . Marland Kitchenlucose blood (ONE TOUCH ULTRA TEST) test strip Use to check sugar fasting in the AM and 2 hours after lunch or supper. Dx: E11.21  . levETIRAcetam (KEPPRA) 750 MG tablet Take 500 mg by mouth 2 (two) times daily. 2 pills in AM and 3 pills in PM  . lisinopril (PRINIVIL,ZESTRIL) 10 MG tablet TAKE 1 TABLET (10 MG TOTAL) BY MOUTH 2 (TWO) TIMES DAILY.  . metFORMIN (GLUCOPHAGE) 1000 MG tablet TAKE 1 TABLET BY MOUTH TWICE A DAY WITH A MEAL  . tamsulosin (FLOMAX) 0.4 MG CAPS capsule TAKE 1 CAPSULE (0.4 MG TOTAL) BY MOUTH DAILY.   Current Facility-Administered Medications on File Prior to Visit  Medication  . triamcinolone acetonide (KENALOG) 10 MG/ML injection 10 mg    Review of Systems Per HPI unless specifically indicated in ROS section     Objective:    BP 118/70   Pulse 92   Temp 98.1 F (36.7 C) (Oral)   Ht 6' 1.25" (1.861 m)   Wt 241 lb 8 oz (109.5 kg)   BMI 31.64 kg/m   Wt Readings from Last 3 Encounters:  09/12/16 241 lb 8 oz (109.5 kg)  04/13/16 245 lb (111.1 kg)  03/11/16 241 lb (109.3 kg)    Physical Exam  Constitutional:  He is oriented to person, place, and time. He appears well-developed and well-nourished. No distress.  HENT:  Head: Normocephalic and atraumatic.  Right Ear: Hearing, tympanic membrane, external ear and ear canal normal.  Left Ear: Hearing, tympanic membrane, external ear and ear canal normal.  Nose: Nose normal.  Mouth/Throat: Uvula is midline, oropharynx is clear and moist and mucous membranes are normal. No oropharyngeal exudate, posterior oropharyngeal edema or posterior oropharyngeal erythema.  Eyes: Conjunctivae and EOM are normal. Pupils are equal, round, and reactive to light. No scleral icterus.  Neck: Normal range of motion. Neck supple. No thyromegaly present.    Cardiovascular: Normal rate, regular rhythm, normal heart sounds and intact distal pulses.   No murmur heard. Pulses:      Radial pulses are 2+ on the right side, and 2+ on the left side.  Pulmonary/Chest: Effort normal and breath sounds normal. No respiratory distress. He has no wheezes. He has no rales.  Abdominal: Soft. Bowel sounds are normal. He exhibits no distension and no mass. There is no tenderness. There is no rebound and no guarding.  Musculoskeletal: Normal range of motion. He exhibits no edema.  Lymphadenopathy:    He has no cervical adenopathy.  Neurological: He is alert and oriented to person, place, and time.  CN grossly intact, station and gait intact  Skin: Skin is warm and dry. No rash noted.  Psychiatric: He has a normal mood and affect. His behavior is normal. Judgment and thought content normal.  Nursing note and vitals reviewed.  Results for orders placed or performed in visit on 09/05/16  Lipid panel  Result Value Ref Range   Cholesterol 112 <200 mg/dL   Triglycerides 95 <150 mg/dL   HDL 34 (L) >40 mg/dL   Total CHOL/HDL Ratio 3.3 <5.0 Ratio   VLDL 19 <30 mg/dL   LDL Cholesterol 59 <100 mg/dL  Comprehensive metabolic panel  Result Value Ref Range   Sodium 140 135 - 146 mmol/L   Potassium 4.4 3.5 - 5.3 mmol/L   Chloride 105 98 - 110 mmol/L   CO2 24 20 - 31 mmol/L   Glucose, Bld 182 (H) 65 - 99 mg/dL   BUN 19 7 - 25 mg/dL   Creat 1.52 (H) 0.70 - 1.25 mg/dL   Total Bilirubin 0.7 0.2 - 1.2 mg/dL   Alkaline Phosphatase 111 40 - 115 U/L   AST 11 10 - 35 U/L   ALT 11 9 - 46 U/L   Total Protein 6.6 6.1 - 8.1 g/dL   Albumin 4.1 3.6 - 5.1 g/dL   Calcium 9.3 8.6 - 10.3 mg/dL  Hemoglobin A1c  Result Value Ref Range   Hgb A1c MFr Bld 7.5 (H) <5.7 %   Mean Plasma Glucose 169 mg/dL  PSA  Result Value Ref Range   PSA 2.3 <=4.0 ng/mL  CBC with Differential/Platelet  Result Value Ref Range   WBC 7.1 3.8 - 10.8 K/uL   RBC 5.26 4.20 - 5.80 MIL/uL   Hemoglobin  14.5 13.2 - 17.1 g/dL   HCT 45.2 38.5 - 50.0 %   MCV 85.9 80.0 - 100.0 fL   MCH 27.6 27.0 - 33.0 pg   MCHC 32.1 32.0 - 36.0 g/dL   RDW 12.7 11.0 - 15.0 %   Platelets 185 140 - 400 K/uL   MPV 11.1 7.5 - 12.5 fL   Neutro Abs 3,763 1,500 - 7,800 cells/uL   Lymphs Abs 2,485 850 - 3,900 cells/uL   Monocytes Absolute 497 200 -  950 cells/uL   Eosinophils Absolute 355 15 - 500 cells/uL   Basophils Absolute 0 0 - 200 cells/uL   Neutrophils Relative % 53 %   Lymphocytes Relative 35 %   Monocytes Relative 7 %   Eosinophils Relative 5 %   Basophils Relative 0 %   Smear Review Criteria for review not met   eGFR = 59% (AA)    Assessment & Plan:  Discussed I did not notice significant weight loss at this time, he will monitor at home and update me if persistent loss noted.  Problem List Items Addressed This Visit    CKD stage 3 due to type 2 diabetes mellitus (HCC)    Chronic, stable. Continue ACEI.       Relevant Medications   glimepiride (AMARYL) 2 MG tablet   Controlled type 2 diabetes mellitus with diabetic nephropathy (HCC)    A bit deteriorated. Attributes to decreased exercise with colder weather/snow. Motivated to increase activity. Continue current regimen, increase glimepiride 93m from QOD dosing to QD dosing. RTC 6 mo f/u visit.       Relevant Medications   glimepiride (AMARYL) 2 MG tablet   Elevated PSA    Appreciate uro care. Established with Dr OKarsten Ro PSA has returned to normal.       Essential hypertension, benign    Chronic, stable. Continue current regimen.       Healthcare maintenance - Primary    Preventative protocols reviewed and updated unless pt declined. Discussed healthy diet and lifestyle.       Hyperlipidemia    Chronic, stable. Continue lipitor 245mdaily.       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 [facility]. Has tapered off lamictal with improved imbalance.        Unsteadiness on feet    Improved off lamictal          Follow up plan: Return in about 6 months (around 03/12/2017) for follow up visit.  JaRia BushMD

## 2016-09-12 NOTE — Assessment & Plan Note (Signed)
Preventative protocols reviewed and updated unless pt declined. Discussed healthy diet and lifestyle.  

## 2016-09-12 NOTE — Assessment & Plan Note (Signed)
A bit deteriorated. Attributes to decreased exercise with colder weather/snow. Motivated to increase activity. Continue current regimen, increase glimepiride 2mg  from QOD dosing to QD dosing. RTC 6 mo f/u visit.

## 2016-09-12 NOTE — Patient Instructions (Signed)
Increase activity with better weather. Urinalysis today. You are doing well today. No changes. Return as needed or in 6 months for follow up visit.   Health Maintenance, Male A healthy lifestyle and preventative care can promote health and wellness.  Maintain regular health, dental, and eye exams.  Eat a healthy diet. Foods like vegetables, fruits, whole grains, low-fat dairy products, and lean protein foods contain the nutrients you need and are low in calories. Decrease your intake of foods high in solid fats, added sugars, and salt. Get information about a proper diet from your health care provider, if necessary.  Regular physical exercise is one of the most important things you can do for your health. Most adults should get at least 150 minutes of moderate-intensity exercise (any activity that increases your heart rate and causes you to sweat) each week. In addition, most adults need muscle-strengthening exercises on 2 or more days a week.   Maintain a healthy weight. The body mass index (BMI) is a screening tool to identify possible weight problems. It provides an estimate of body fat based on height and weight. Your health care provider can find your BMI and can help you achieve or maintain a healthy weight. For males 20 years and older:  A BMI below 18.5 is considered underweight.  A BMI of 18.5 to 24.9 is normal.  A BMI of 25 to 29.9 is considered overweight.  A BMI of 30 and above is considered obese.  Maintain normal blood lipids and cholesterol by exercising and minimizing your intake of saturated fat. Eat a balanced diet with plenty of fruits and vegetables. Blood tests for lipids and cholesterol should begin at age 6 and be repeated every 5 years. If your lipid or cholesterol levels are high, you are over age 32, or you are at high risk for heart disease, you may need your cholesterol levels checked more frequently.Ongoing high lipid and cholesterol levels should be treated  with medicines if diet and exercise are not working.  If you smoke, find out from your health care provider how to quit. If you do not use tobacco, do not start.  Lung cancer screening is recommended for adults aged 32-80 years who are at high risk for developing lung cancer because of a history of smoking. A yearly low-dose CT scan of the lungs is recommended for people who have at least a 30-pack-year history of smoking and are current smokers or have quit within the past 15 years. A pack year of smoking is smoking an average of 1 pack of cigarettes a day for 1 year (for example, a 30-pack-year history of smoking could mean smoking 1 pack a day for 30 years or 2 packs a day for 15 years). Yearly screening should continue until the smoker has stopped smoking for at least 15 years. Yearly screening should be stopped for people who develop a health problem that would prevent them from having lung cancer treatment.  If you choose to drink alcohol, do not have more than 2 drinks per day. One drink is considered to be 12 oz (360 mL) of beer, 5 oz (150 mL) of wine, or 1.5 oz (45 mL) of liquor.  Avoid the use of street drugs. Do not share needles with anyone. Ask for help if you need support or instructions about stopping the use of drugs.  High blood pressure causes heart disease and increases the risk of stroke. High blood pressure is more likely to develop in:  People who  have blood pressure in the end of the normal range (100-139/85-89 mm Hg).  People who are overweight or obese.  People who are African American.  If you are 68-15 years of age, have your blood pressure checked every 3-5 years. If you are 64 years of age or older, have your blood pressure checked every year. You should have your blood pressure measured twice-once when you are at a hospital or clinic, and once when you are not at a hospital or clinic. Record the average of the two measurements. To check your blood pressure when you are  not at a hospital or clinic, you can use:  An automated blood pressure machine at a pharmacy.  A home blood pressure monitor.  If you are 59-30 years old, ask your health care provider if you should take aspirin to prevent heart disease.  Diabetes screening involves taking a blood sample to check your fasting blood sugar level. This should be done once every 3 years after age 50 if you are at a normal weight and without risk factors for diabetes. Testing should be considered at a younger age or be carried out more frequently if you are overweight and have at least 1 risk factor for diabetes.  Colorectal cancer can be detected and often prevented. Most routine colorectal cancer screening begins at the age of 84 and continues through age 21. However, your health care provider may recommend screening at an earlier age if you have risk factors for colon cancer. On a yearly basis, your health care provider may provide home test kits to check for hidden blood in the stool. A small camera at the end of a tube may be used to directly examine the colon (sigmoidoscopy or colonoscopy) to detect the earliest forms of colorectal cancer. Talk to your health care provider about this at age 77 when routine screening begins. A direct exam of the colon should be repeated every 5-10 years through age 45, unless early forms of precancerous polyps or small growths are found.  People who are at an increased risk for hepatitis B should be screened for this virus. You are considered at high risk for hepatitis B if:  You were born in a country where hepatitis B occurs often. Talk with your health care provider about which countries are considered high risk.  Your parents were born in a high-risk country and you have not received a shot to protect against hepatitis B (hepatitis B vaccine).  You have HIV or AIDS.  You use needles to inject street drugs.  You live with, or have sex with, someone who has hepatitis  B.  You are a man who has sex with other men (MSM).  You get hemodialysis treatment.  You take certain medicines for conditions like cancer, organ transplantation, and autoimmune conditions.  Hepatitis C blood testing is recommended for all people born from [birth year] through 1963 and any individual with known risk factors for hepatitis C.  Healthy men should no longer receive prostate-specific antigen (PSA) blood tests as part of routine cancer screening. Talk to your health care provider about prostate cancer screening.  Testicular cancer screening is not recommended for adolescents or adult males who have no symptoms. Screening includes self-exam, a health care provider exam, and other screening tests. Consult with your health care provider about any symptoms you have or any concerns you have about testicular cancer.  Practice safe sex. Use condoms and avoid high-risk sexual practices to reduce the spread of sexually transmitted  infections (STIs).  You should be screened for STIs, including gonorrhea and chlamydia if:  You are sexually active and are younger than 24 years.  You are older than 24 years, and your health care provider tells you that you are at risk for this type of infection.  Your sexual activity has changed since you were last screened, and you are at an increased risk for chlamydia or gonorrhea. Ask your health care provider if you are at risk.  If you are at risk of being infected with HIV, it is recommended that you take a prescription medicine daily to prevent HIV infection. This is called pre-exposure prophylaxis (PrEP). You are considered at risk if:  You are a man who has sex with other men (MSM).  You are a heterosexual man who is sexually active with multiple partners.  You take drugs by injection.  You are sexually active with a partner who has HIV.  Talk with your health care provider about whether you are at high risk of being infected with HIV. If you  choose to begin PrEP, you should first be tested for HIV. You should then be tested every 3 months for as long as you are taking PrEP.  Use sunscreen. Apply sunscreen liberally and repeatedly throughout the day. You should seek shade when your shadow is shorter than you. Protect yourself by wearing long sleeves, pants, a wide-brimmed hat, and sunglasses year round whenever you are outdoors.  Tell your health care provider of new moles or changes in moles, especially if there is a change in shape or color. Also, tell your health care provider if a mole is larger than the size of a pencil eraser.  A one-time screening for abdominal aortic aneurysm (AAA) and surgical repair of large AAAs by ultrasound is recommended for men aged 34-75 years who are current or former smokers.  Stay current with your vaccines (immunizations). This information is not intended to replace advice given to you by your health care provider. Make sure you discuss any questions you have with your health care provider. Document Released: 02/04/2008 Document Revised: 08/29/2014 Document Reviewed: 05/12/2015 Elsevier Interactive Patient Education  2017 [employer].

## 2016-09-12 NOTE — Assessment & Plan Note (Signed)
Chronic, stable. Continue ACEI. 

## 2016-09-12 NOTE — Assessment & Plan Note (Signed)
Chronic, stable. Continue current regimen. 

## 2016-09-12 NOTE — Addendum Note (Signed)
Addended by: Royann Shivers A on: 09/12/2016 03:48 PM   Modules accepted: Orders

## 2016-09-12 NOTE — Assessment & Plan Note (Addendum)
Chronic, stable. Followed by [facility] neurology, on keppra. Has tapered off lamictal with improved imbalance.

## 2016-09-12 NOTE — Assessment & Plan Note (Signed)
Appreciate uro care. Established with Dr Karsten Ro. PSA has returned to normal.

## 2016-09-12 NOTE — Assessment & Plan Note (Signed)
Chronic, stable. Continue lipitor 20mg daily. 

## 2016-09-20 ENCOUNTER — Other Ambulatory Visit: Payer: Self-pay | Admitting: Family Medicine

## 2016-10-19 ENCOUNTER — Ambulatory Visit: Payer: Self-pay | Admitting: Podiatry

## 2016-10-22 ENCOUNTER — Other Ambulatory Visit: Payer: Self-pay | Admitting: Family Medicine

## 2016-12-20 LAB — HM DIABETES EYE EXAM

## 2017-02-13 ENCOUNTER — Other Ambulatory Visit: Payer: Self-pay | Admitting: Family Medicine

## 2017-02-13 ENCOUNTER — Other Ambulatory Visit (INDEPENDENT_AMBULATORY_CARE_PROVIDER_SITE_OTHER): Payer: Managed Care, Other (non HMO)

## 2017-02-13 DIAGNOSIS — E1121 Type 2 diabetes mellitus with diabetic nephropathy: Secondary | ICD-10-CM

## 2017-02-13 NOTE — Addendum Note (Signed)
Addended by: Ellamae Sia on: 02/13/2017 02:04 PM   Modules accepted: Orders

## 2017-02-14 LAB — RENAL FUNCTION PANEL
Albumin: 4.2 g/dL (ref 3.6–5.1)
BUN: 17 mg/dL (ref 7–25)
CHLORIDE: 104 mmol/L (ref 98–110)
CO2: 21 mmol/L (ref 20–31)
CREATININE: 1.68 mg/dL — AB (ref 0.70–1.25)
Calcium: 9.6 mg/dL (ref 8.6–10.3)
GLUCOSE: 149 mg/dL — AB (ref 65–99)
Phosphorus: 3.4 mg/dL (ref 2.5–4.5)
Potassium: 4 mmol/L (ref 3.5–5.3)
SODIUM: 140 mmol/L (ref 135–146)

## 2017-02-14 LAB — HEMOGLOBIN A1C
Hgb A1c MFr Bld: 6.8 % — ABNORMAL HIGH (ref <5.7)
Mean Plasma Glucose: 148 mg/dL

## 2017-02-15 ENCOUNTER — Ambulatory Visit (INDEPENDENT_AMBULATORY_CARE_PROVIDER_SITE_OTHER): Payer: Managed Care, Other (non HMO) | Admitting: Family Medicine

## 2017-02-15 ENCOUNTER — Encounter: Payer: Self-pay | Admitting: Family Medicine

## 2017-02-15 VITALS — BP 124/70 | HR 80 | Temp 97.6°F | Wt 241.5 lb

## 2017-02-15 DIAGNOSIS — E785 Hyperlipidemia, unspecified: Secondary | ICD-10-CM

## 2017-02-15 DIAGNOSIS — R5382 Chronic fatigue, unspecified: Secondary | ICD-10-CM

## 2017-02-15 DIAGNOSIS — E1122 Type 2 diabetes mellitus with diabetic chronic kidney disease: Secondary | ICD-10-CM | POA: Diagnosis not present

## 2017-02-15 DIAGNOSIS — E1121 Type 2 diabetes mellitus with diabetic nephropathy: Secondary | ICD-10-CM

## 2017-02-15 DIAGNOSIS — N183 Chronic kidney disease, stage 3 unspecified: Secondary | ICD-10-CM

## 2017-02-15 DIAGNOSIS — M2550 Pain in unspecified joint: Secondary | ICD-10-CM | POA: Diagnosis not present

## 2017-02-15 DIAGNOSIS — I10 Essential (primary) hypertension: Secondary | ICD-10-CM

## 2017-02-15 DIAGNOSIS — G4733 Obstructive sleep apnea (adult) (pediatric): Secondary | ICD-10-CM | POA: Insufficient documentation

## 2017-02-15 DIAGNOSIS — E559 Vitamin D deficiency, unspecified: Secondary | ICD-10-CM

## 2017-02-15 NOTE — Assessment & Plan Note (Signed)
Chronic, stable. Continue current regimen. 

## 2017-02-15 NOTE — Assessment & Plan Note (Signed)
Chronic, stable. Tolerating med well.

## 2017-02-15 NOTE — Progress Notes (Signed)
BP 124/70   Pulse 80   Temp 97.6 F (36.4 C) (Oral)   Wt 241 lb 8 oz (109.5 kg)   SpO2 96%   BMI 31.64 kg/m    CC: 6 mo f/u visit Subjective:    Patient ID: Kirk Bennett., male    DOB: [birth year], 66 y.o.   MRN: [id number]  HPI: Kirk Bennett. is a 65 y.o. male presenting on 02/15/2017 for Follow-up and Fatigue   Fatigue - ongoing over last 6 months. Some daytime sleepiness. He does feel rested when he awakens. He does snore. No witnessed apnea or PNdyspnea. Breathe rite patches did help previously. Averages 7 hours of sleep. Energy ok. Stamina ok.   Ongoing hip pain, leg pain (L knee, R ankle), shoulders. No hand pain, no back pain. Ongoing for months. Denies inciting trauma/injury.   DM - regularly does check sugars: once daily, well controlled. Compliant with antihyperglycemic regimen which includes: farxiga '10mg'$  daily, amaryl '2mg'$  daily, and metformin '1000mg'$  bid. Denies low sugars or hypoglycemic symptoms.  Denies paresthesias. Last diabetic eye exam 12/2015 - monitoring for glaucoma. Pneumovax: 2013.  Prevnar: not due. Has been followed by Dr Gabriel Carina endo - last seen 07/2016 planned f/u PRN. rec stop farxiga if eGFR <45, metformin if eGFR <30. Current eGFR ~50.  Lab Results  Component Value Date   HGBA1C 6.8 (H) 02/13/2017   Diabetic Foot Exam - Simple   Simple Foot Form Diabetic Foot exam was performed with the following findings:  Yes 02/15/2017  9:15 AM  Visual Inspection No deformities, no ulcerations, no other skin breakdown bilaterally:  Yes Sensation Testing Intact to touch and monofilament testing bilaterally:  Yes Pulse Check Posterior Tibialis and Dorsalis pulse intact bilaterally:  Yes Comments Some skin maceration between 4th/5th digits bilaterally      HLD - compliant with lipitor '40mg'$  daily without myalgias.  HTN - compliant with lisinopril '10mg'$  daily. No HA, vision changes, CP/tightness, SOB, leg swelling. No low blood pressure symptoms of  dizziness/lightheadedness.   Relevant past medical, surgical, family and social history reviewed and updated as indicated. Interim medical history since our last visit reviewed. Allergies and medications reviewed and updated. Outpatient Medications Prior to Visit  Medication Sig Dispense Refill  . atorvastatin (LIPITOR) 20 MG tablet Take 1 tablet (20 mg total) by mouth daily. 90 tablet 3  . b complex vitamins tablet Take 1 tablet by mouth daily.    . Dapagliflozin Propanediol (FARXIGA) 5 MG TABS Take 1 tablet by mouth daily.    Marland Kitchen FARXIGA 10 MG TABS tablet TAKE 1 TABLET (10 MG TOTAL) BY MOUTH EVERY MORNING. 90 tablet 1  . fluticasone (FLONASE) 50 MCG/ACT nasal spray Place 2 sprays into both nostrils daily. 16 g 1  . glimepiride (AMARYL) 2 MG tablet Take 1 tablet (2 mg total) by mouth daily with breakfast. TAKE 1 TABLET BY MOUTH EVERY OTHER DAY BEFORE BREAKFAST 90 tablet 3  . glucose blood (ONE TOUCH ULTRA TEST) test strip Use to check sugar fasting in the AM and 2 hours after lunch or supper. Dx: E11.21 100 each 3  . lisinopril (PRINIVIL,ZESTRIL) 10 MG tablet TAKE 1 TABLET (10 MG TOTAL) BY MOUTH 2 (TWO) TIMES DAILY. 180 tablet 3  . metFORMIN (GLUCOPHAGE) 1000 MG tablet TAKE 1 TABLET BY MOUTH TWICE A DAY WITH A MEAL 180 tablet 3  . tamsulosin (FLOMAX) 0.4 MG CAPS capsule TAKE 1 CAPSULE (0.4 MG TOTAL) BY MOUTH DAILY. 90 capsule 1  .  Cholecalciferol (VITAMIN D3) 1000 UNITS CAPS Take 1 capsule (1,000 Units total) by mouth daily. 30 capsule   . levETIRAcetam (KEPPRA) 750 MG tablet Take 500 mg by mouth 2 (two) times daily. 2 pills in AM and 3 pills in PM     Facility-Administered Medications Prior to Visit  Medication Dose Route Frequency Provider Last Rate Last Dose  . triamcinolone acetonide (KENALOG) 10 MG/ML injection 10 mg  10 mg Other Once Landis Martins, DPM         Per HPI unless specifically indicated in ROS section below Review of Systems     Objective:    BP 124/70   Pulse 80    Temp 97.6 F (36.4 C) (Oral)   Wt 241 lb 8 oz (109.5 kg)   SpO2 96%   BMI 31.64 kg/m   Wt Readings from Last 3 Encounters:  02/15/17 241 lb 8 oz (109.5 kg)  09/12/16 241 lb 8 oz (109.5 kg)  04/13/16 245 lb (111.1 kg)    Physical Exam  Constitutional: He appears well-developed and well-nourished. No distress.  HENT:  Head: Normocephalic and atraumatic.  Right Ear: External ear normal.  Left Ear: External ear normal.  Nose: Nose normal.  Mouth/Throat: Oropharynx is clear and moist. No oropharyngeal exudate.  Eyes: Conjunctivae and EOM are normal. Pupils are equal, round, and reactive to light. No scleral icterus.  Neck: Normal range of motion. Neck supple. No thyromegaly present.  Cardiovascular: Normal rate, regular rhythm, normal heart sounds and intact distal pulses.   No murmur heard. Pulmonary/Chest: Effort normal and breath sounds normal. No respiratory distress. He has no wheezes. He has no rales.  Musculoskeletal: He exhibits no edema.  See HPI for foot exam if done  Lymphadenopathy:    He has no cervical adenopathy.  Skin: Skin is warm and dry. No rash noted.  Psychiatric: He has a normal mood and affect.  Nursing note and vitals reviewed.  Results for orders placed or performed in visit on 02/15/17  HM DIABETES EYE EXAM  Result Value Ref Range   HM Diabetic Eye Exam No Retinopathy No Retinopathy      Assessment & Plan:   Problem List Items Addressed This Visit    Chronic fatigue    Ongoing over last 6 months. Endorses snoring and daytime somnolence. ESS = 15. Reasonable to refer for sleep evaluation r/o OSA. Pt agrees.       Relevant Orders   Ambulatory referral to Pulmonology   CKD stage 3 due to type 2 diabetes mellitus (HCC)    Chronic, stable to slightly impaired.  Will continue ACEI.  Return in 3 months for labs only for close monitoring  If worsening kidney function, low threshold to return to renal for re evaluation.       Controlled type 2  diabetes mellitus with diabetic nephropathy (HCC) - Primary    Chronic, stable. Continue current regimen. He has decided to return to PCP care for DM.       Essential hypertension, benign    Chronic, stable. Continue current regimen.       Hyperlipidemia    Chronic, stable. Tolerating med well.       Polyarthralgia    Anticipate OA related.       Vitamin D deficiency    rec restart vit D 1000 IU daily.          Follow up plan: Return in about 7 months (around 09/17/2017) for annual exam, prior fasting for blood work.  Ria Bush, MD

## 2017-02-15 NOTE — Assessment & Plan Note (Signed)
Anticipate OA related.

## 2017-02-15 NOTE — Patient Instructions (Addendum)
Return in 3-4 months for lab visit only (recehck kidneys, vitamin levels)  Return in 7 months (after 09/12/2017) for physical.  Restart vitamin D 1000 units daily. Continue current medicines. Good to see you today.

## 2017-02-15 NOTE — Assessment & Plan Note (Signed)
rec restart vit D 1000 IU daily.

## 2017-02-15 NOTE — Assessment & Plan Note (Signed)
Ongoing over last 6 months. Endorses snoring and daytime somnolence. ESS = 15. Reasonable to refer for sleep evaluation r/o OSA. Pt agrees.

## 2017-02-15 NOTE — Assessment & Plan Note (Signed)
Chronic, stable to slightly impaired.  Will continue ACEI.  Return in 3 months for labs only for close monitoring  If worsening kidney function, low threshold to return to renal for re evaluation.

## 2017-02-15 NOTE — Assessment & Plan Note (Signed)
Chronic, stable. Continue current regimen. He has decided to return to PCP care for DM.

## 2017-03-21 ENCOUNTER — Ambulatory Visit (INDEPENDENT_AMBULATORY_CARE_PROVIDER_SITE_OTHER): Payer: 59 | Admitting: Internal Medicine

## 2017-03-21 ENCOUNTER — Encounter: Payer: Self-pay | Admitting: Internal Medicine

## 2017-03-21 VITALS — BP 138/72 | HR 80 | Ht 73.25 in | Wt 249.0 lb

## 2017-03-21 DIAGNOSIS — G4719 Other hypersomnia: Secondary | ICD-10-CM

## 2017-03-21 NOTE — Progress Notes (Signed)
Name: Kirk Bennett. MRN: [id number] DOB: 09-May-1951     CONSULTATION DATE: 03/21/17   REFERRING MD : Danise Mina  CHIEF COMPLAINT: Fatigue and excessive daytime sleepiness  STUDIES:  Chest x-ray 01/20/12 reviewed on 03/21/17 Interpretation no acute infiltrates no acute opacifications no evidence of pneumonia   HISTORY OF PRESENT ILLNESS:    66 year old male with multiple multiple medical problems seen today for excessive daytime sleepiness and fatigue This has been going on for quite a while  Patient has had history of brain surgery back in the 1990s and in this 2005 subsequently developed seizures and is on antiseizure medicines he sees neurologist at [facility] On occasion patient has non-refreshed sleep Patient is a retired Warden/ranger in 2010 He has no smoking history however has secondhand smoke exposure He does a lot of volunteering with his time    Patient  has been having sleep problems for over one year Patient has been having excessive daytime sleepiness Patient has been having extreme fatigue and tiredness, lack of energy +  very Loud snoring every night + struggling breathe at night and gasps for air Epworth sleep score is 10    No signs of infection at this time  no signs of congestive heart failure at this time Patient has a diagnosis of epilepsy     PAST MEDICAL HISTORY :   has a past medical history of Cataracts, bilateral (02/2011); CKD stage 3 due to type 2 diabetes mellitus ([place]) (2015); 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 Prior to Admission medications   Medication Sig Start Date End Date Taking? Authorizing Provider  atorvastatin (LIPITOR) 20 MG tablet Take 1 tablet (20 mg total) by mouth daily. 05/26/15   Ria Bush, MD  b complex vitamins tablet Take 1 tablet by mouth daily.    [provider]  Dapagliflozin Propanediol (FARXIGA) 5 MG TABS Take 1 tablet by mouth daily.    [provider]  FARXIGA 10 MG TABS tablet TAKE 1 TABLET (10 MG TOTAL) BY MOUTH EVERY MORNING. 10/24/16   Ria Bush, MD  fluticasone [facility]) 50 MCG/ACT nasal spray Place 2 sprays into both nostrils daily. 04/13/16   Ria Bush, MD  glimepiride (AMARYL) 2 MG tablet Take 1 tablet (2 mg total) by mouth daily with breakfast. TAKE 1 TABLET BY MOUTH EVERY OTHER DAY BEFORE BREAKFAST 09/12/16   Ria Bush, MD  glucose blood (ONE TOUCH ULTRA TEST) test strip Use to check sugar fasting in the AM and 2 hours after lunch or supper. Dx: E11.21 03/28/16   Ria Bush, MD  levETIRAcetam (KEPPRA) 500 MG tablet Take 500 mg by mouth as directed. 2 tab AM, 3 tab PM    [provider]  lisinopril (PRINIVIL,ZESTRIL) 10 MG tablet TAKE 1 TABLET (10 MG TOTAL) BY MOUTH 2 (TWO) TIMES DAILY. 06/20/16   Ria Bush, MD  metFORMIN (GLUCOPHAGE) 1000 MG tablet TAKE 1 TABLET BY MOUTH TWICE A DAY WITH A MEAL 05/26/15   Ria Bush, MD  tamsulosin (FLOMAX) 0.4 MG CAPS capsule TAKE 1 CAPSULE (0.4 MG TOTAL)  BY MOUTH DAILY. 09/20/16   Ria Bush, MD   No Known Allergies  FAMILY HISTORY:  family history includes Aortic dissection in his mother; Cancer in his cousin and mother; Diabetes in his cousin and maternal aunt; Heart failure in his father; Mental illness in his sister. SOCIAL HISTORY:  reports that he has never smoked. He has never used smokeless  tobacco. He reports that he does not drink alcohol or use drugs.  REVIEW OF SYSTEMS:   Constitutional: Negative for fever, chills, weight loss, +malaise/fatigue and diaphoresis.  HENT: Negative for hearing loss, ear pain, nosebleeds, congestion, sore throat, neck pain, tinnitus and ear discharge.   Eyes: Negative for blurred vision, double vision, photophobia, pain, discharge and redness.  Respiratory: Negative for cough, hemoptysis, sputum production, shortness of breath, wheezing and stridor.   Cardiovascular: Negative for chest pain, palpitations, orthopnea, claudication, leg swelling and PND.  Gastrointestinal: Negative for heartburn, nausea, vomiting, abdominal pain, diarrhea, constipation, blood in stool and melena.  Genitourinary: Negative for dysuria, urgency, frequency, hematuria and flank pain.  Musculoskeletal: Negative for myalgias, back pain, joint pain and falls.  Skin: Negative for itching and rash.  Neurological: Negative for dizziness, tingling, tremors, sensory change, speech change, focal weakness, seizures, loss of consciousness, weakness and headaches.  Endo/Heme/Allergies: Negative for environmental allergies and polydipsia. Does not bruise/bleed easily.  ALL OTHER ROS ARE NEGATIVE   Physical Examination:   GENERAL:NAD, no fevers, chills, no weakness no fatigue HEAD: Normocephalic, atraumatic.  EYES: Pupils equal, round, reactive to light. Extraocular muscles intact. No scleral icterus.  MOUTH: Moist mucosal membrane.   EAR, NOSE, THROAT: Clear without exudates. No external lesions.  NECK: Supple. No thyromegaly. No nodules. No JVD.  PULMONARY:CTA B/L no wheezes, no crackles, no rhonchi CARDIOVASCULAR: S1 and S2. Regular rate and rhythm. No murmurs, rubs, or gallops. No edema.  GASTROINTESTINAL: Soft, nontender, nondistended. No masses. Positive bowel sounds.  MUSCULOSKELETAL: No swelling, clubbing, or edema. Range of motion full in all extremities.  NEUROLOGIC:  Cranial nerves II through XII are intact. No gross focal neurological deficits.  SKIN: No ulceration, lesions, rashes, or cyanosis. Skin warm and dry. Turgor intact.  PSYCHIATRIC: Mood, affect within normal limits. The patient is awake, alert and oriented x 3. Insight, judgment intact.      ASSESSMENT / PLAN: 66 year old pleasant African-American male with multiple medical issues including a history of seizure disorder in the setting of excessive daytime sleepiness with progressive fatigue throughout the day with loud snoring which suggests possible underlying obstructive sleep apnea  At this time patient will need a sleep study to confirm obstructive sleep apnea In due to his underlying seizure disorder patient will need to have in the lab sleep study  I have explained this to the patient and he is willing to pursue further diagnostic evaluation  follow up after test completed  Patient satisfied with Plan of action and management. All questions answered  Corrin Parker, M.D.  [facility]  Medical Director [place] Director [facility] Cardio-Pulmonary Department

## 2017-03-21 NOTE — Patient Instructions (Signed)
Patient will need sleep study for further assessment

## 2017-04-25 ENCOUNTER — Other Ambulatory Visit: Payer: Self-pay | Admitting: Family Medicine

## 2017-04-29 ENCOUNTER — Other Ambulatory Visit: Payer: Self-pay | Admitting: Family Medicine

## 2017-05-10 ENCOUNTER — Other Ambulatory Visit: Payer: Self-pay | Admitting: Family Medicine

## 2017-05-10 DIAGNOSIS — E1121 Type 2 diabetes mellitus with diabetic nephropathy: Secondary | ICD-10-CM

## 2017-05-18 ENCOUNTER — Other Ambulatory Visit (INDEPENDENT_AMBULATORY_CARE_PROVIDER_SITE_OTHER): Payer: 59

## 2017-05-18 ENCOUNTER — Ambulatory Visit: Payer: 59 | Attending: Internal Medicine

## 2017-05-18 DIAGNOSIS — G4701 Insomnia due to medical condition: Secondary | ICD-10-CM | POA: Diagnosis present

## 2017-05-18 DIAGNOSIS — E1121 Type 2 diabetes mellitus with diabetic nephropathy: Secondary | ICD-10-CM | POA: Diagnosis not present

## 2017-05-18 NOTE — Addendum Note (Signed)
Addended by: Ellamae Sia on: 05/18/2017 08:33 AM   Modules accepted: Orders

## 2017-05-19 LAB — RENAL FUNCTION PANEL
Albumin: 4.1 g/dL (ref 3.6–5.1)
BUN / CREAT RATIO: 11 (calc) (ref 6–22)
BUN: 16 mg/dL (ref 7–25)
CO2: 26 mmol/L (ref 20–32)
CREATININE: 1.49 mg/dL — AB (ref 0.70–1.25)
Calcium: 9.2 mg/dL (ref 8.6–10.3)
Chloride: 105 mmol/L (ref 98–110)
Glucose, Bld: 194 mg/dL — ABNORMAL HIGH (ref 65–99)
POTASSIUM: 4.4 mmol/L (ref 3.5–5.3)
Phosphorus: 3.5 mg/dL (ref 2.5–4.5)
SODIUM: 141 mmol/L (ref 135–146)

## 2017-05-19 LAB — HEMOGLOBIN A1C
HEMOGLOBIN A1C: 7.3 %{Hb} — AB (ref <5.7)
Mean Plasma Glucose: 163 (calc)
eAG (mmol/L): 9 (calc)

## 2017-05-25 DIAGNOSIS — G4733 Obstructive sleep apnea (adult) (pediatric): Secondary | ICD-10-CM | POA: Diagnosis not present

## 2017-05-26 ENCOUNTER — Telehealth: Payer: Self-pay | Admitting: *Deleted

## 2017-05-26 DIAGNOSIS — G4733 Obstructive sleep apnea (adult) (pediatric): Secondary | ICD-10-CM

## 2017-05-26 NOTE — Telephone Encounter (Signed)
Pt informed he tested positive for OSA. Order placed for CPAP titration study. Nothing further needed.

## 2017-05-26 NOTE — Telephone Encounter (Signed)
-----   Message from Laverle Hobby, MD sent at 05/25/2017  7:47 PM EDT ----- Regarding: sleep study results.  Recommend:  Sleep study with CPAP titration

## 2017-06-04 ENCOUNTER — Other Ambulatory Visit: Payer: Self-pay | Admitting: Family Medicine

## 2017-06-15 ENCOUNTER — Ambulatory Visit: Payer: 59 | Attending: Internal Medicine

## 2017-06-15 DIAGNOSIS — G4733 Obstructive sleep apnea (adult) (pediatric): Secondary | ICD-10-CM | POA: Diagnosis not present

## 2017-06-15 DIAGNOSIS — G4761 Periodic limb movement disorder: Secondary | ICD-10-CM | POA: Insufficient documentation

## 2017-06-15 DIAGNOSIS — G4701 Insomnia due to medical condition: Secondary | ICD-10-CM | POA: Insufficient documentation

## 2017-06-16 ENCOUNTER — Telehealth: Payer: Self-pay | Admitting: *Deleted

## 2017-06-16 NOTE — Telephone Encounter (Signed)
-----   Message from Laverle Hobby, MD sent at 06/16/2017  1:43 PM EDT ----- Regarding: cpap titration results.  R E C O M M E N D A T I O N S  .Auto-CPAP with pressure range of 6-12 cm H2O.  Marland KitchenResmed Airfit P10 NP, Medium

## 2017-06-16 NOTE — Telephone Encounter (Signed)
Discussed results of sleep study with patient. Patient wishes to hold off on placing orders for cpap at this time. When asked why "patient states he had difficulty with mask and nasal pillows and wishes to hold off". Nothing further needed.

## 2017-06-22 ENCOUNTER — Other Ambulatory Visit: Payer: Self-pay | Admitting: Family Medicine

## 2017-08-29 ENCOUNTER — Encounter: Payer: Self-pay | Admitting: Internal Medicine

## 2017-08-29 ENCOUNTER — Ambulatory Visit (INDEPENDENT_AMBULATORY_CARE_PROVIDER_SITE_OTHER): Payer: 59 | Admitting: Internal Medicine

## 2017-08-29 VITALS — BP 128/70 | HR 96 | Ht 73.25 in | Wt 248.0 lb

## 2017-08-29 DIAGNOSIS — G4730 Sleep apnea, unspecified: Secondary | ICD-10-CM | POA: Diagnosis not present

## 2017-08-29 NOTE — Patient Instructions (Signed)
Follow up as needed

## 2017-08-29 NOTE — Progress Notes (Signed)
   Name: Kirk Bennett. MRN: [id number] DOB: 1950-10-30     CONSULTATION DATE: 03/21/17   REFERRING MD : Danise Mina  CHIEF COMPLAINT: Fatigue and excessive daytime sleepiness  STUDIES:  Chest x-ray 01/20/12 reviewed on 03/21/17 Interpretation no acute infiltrates no acute opacifications no evidence of pneumonia   HISTORY OF PRESENT ILLNESS:   Follow up sleep study-results explained to patient AHI 8 Has episodic desats at night Patient refuses to comply with recommendations and he hated the nasal mask and full face mask He DOES NOT WANT therapy at this time   No signs of infection at this time  no signs of congestive heart failure at this time Patient has a diagnosis of epilepsy    REVIEW OF SYSTEMS:   ALL OTHER ROS ARE NEGATIVE   Physical Examination:   GENERAL:NAD, no fevers, chills, no weakness no fatigue EAR, NOSE, THROAT: Clear without exudates. No external lesions.  NECK: Supple. No thyromegaly. No nodules. No JVD.  PULMONARY:CTA B/L no wheezes, no crackles, no rhonchi CARDIOVASCULAR: S1 and S2. Regular rate and rhythm. No murmurs, rubs, or gallops. No edema.  PSYCHIATRIC: Mood, affect within normal limits. The patient is awake, alert and oriented x 3. Insight, judgment intact.      ASSESSMENT / PLAN: 67 year old pleasant African-American male with multiple medical issues including a history of seizure disorder in the setting of excessive daytime sleepiness with progressive fatigue throughout the day with loud snoring with underlying DX of OSA  Patient does NOT WANT THERAPY AT THIS TIME  I have explained the risks of NOT getting therapy(daytiem sleepiness, palpations, HTN and death) He understands the risks.  patient satisfied with Plan of action and management. All questions answered Follow up as needed   Corrin Parker, M.D.  [facility]  Medical Director [place] Director [facility] Cardio-Pulmonary  Department

## 2017-09-09 ENCOUNTER — Other Ambulatory Visit: Payer: Self-pay | Admitting: Family Medicine

## 2017-09-10 ENCOUNTER — Other Ambulatory Visit: Payer: Self-pay | Admitting: Family Medicine

## 2017-09-10 DIAGNOSIS — N183 Chronic kidney disease, stage 3 unspecified: Secondary | ICD-10-CM

## 2017-09-10 DIAGNOSIS — E559 Vitamin D deficiency, unspecified: Secondary | ICD-10-CM

## 2017-09-10 DIAGNOSIS — E538 Deficiency of other specified B group vitamins: Secondary | ICD-10-CM

## 2017-09-10 DIAGNOSIS — E785 Hyperlipidemia, unspecified: Secondary | ICD-10-CM

## 2017-09-10 DIAGNOSIS — E1121 Type 2 diabetes mellitus with diabetic nephropathy: Secondary | ICD-10-CM

## 2017-09-10 DIAGNOSIS — R972 Elevated prostate specific antigen [PSA]: Secondary | ICD-10-CM

## 2017-09-10 DIAGNOSIS — E1122 Type 2 diabetes mellitus with diabetic chronic kidney disease: Secondary | ICD-10-CM

## 2017-09-11 ENCOUNTER — Other Ambulatory Visit (INDEPENDENT_AMBULATORY_CARE_PROVIDER_SITE_OTHER): Payer: 59

## 2017-09-11 DIAGNOSIS — E1122 Type 2 diabetes mellitus with diabetic chronic kidney disease: Secondary | ICD-10-CM

## 2017-09-11 DIAGNOSIS — E538 Deficiency of other specified B group vitamins: Secondary | ICD-10-CM

## 2017-09-11 DIAGNOSIS — E559 Vitamin D deficiency, unspecified: Secondary | ICD-10-CM

## 2017-09-11 DIAGNOSIS — N183 Chronic kidney disease, stage 3 (moderate): Secondary | ICD-10-CM

## 2017-09-11 DIAGNOSIS — E785 Hyperlipidemia, unspecified: Secondary | ICD-10-CM

## 2017-09-11 DIAGNOSIS — E1121 Type 2 diabetes mellitus with diabetic nephropathy: Secondary | ICD-10-CM

## 2017-09-11 DIAGNOSIS — R972 Elevated prostate specific antigen [PSA]: Secondary | ICD-10-CM

## 2017-09-11 NOTE — Addendum Note (Signed)
Addended by: Ellamae Sia on: 09/11/2017 08:05 AM   Modules accepted: Orders

## 2017-09-11 NOTE — Addendum Note (Signed)
Addended by: Ellamae Sia on: 09/11/2017 08:06 AM   Modules accepted: Orders

## 2017-09-12 LAB — VITAMIN B12: VITAMIN B 12: 1749 pg/mL — AB (ref 200–1100)

## 2017-09-12 LAB — HEMOGLOBIN A1C
EAG (MMOL/L): 8.4 (calc)
Hgb A1c MFr Bld: 6.9 % of total Hgb — ABNORMAL HIGH (ref <5.7)
Mean Plasma Glucose: 151 (calc)

## 2017-09-12 LAB — COMPREHENSIVE METABOLIC PANEL
AG RATIO: 1.7 (calc) (ref 1.0–2.5)
ALKALINE PHOSPHATASE (APISO): 103 U/L (ref 40–115)
ALT: 12 U/L (ref 9–46)
AST: 14 U/L (ref 10–35)
Albumin: 4.3 g/dL (ref 3.6–5.1)
BILIRUBIN TOTAL: 1 mg/dL (ref 0.2–1.2)
BUN/Creatinine Ratio: 10 (calc) (ref 6–22)
BUN: 16 mg/dL (ref 7–25)
CALCIUM: 9.6 mg/dL (ref 8.6–10.3)
CO2: 24 mmol/L (ref 20–32)
Chloride: 105 mmol/L (ref 98–110)
Creat: 1.65 mg/dL — ABNORMAL HIGH (ref 0.70–1.25)
Globulin: 2.6 g/dL (calc) (ref 1.9–3.7)
Glucose, Bld: 124 mg/dL — ABNORMAL HIGH (ref 65–99)
Potassium: 4.3 mmol/L (ref 3.5–5.3)
Sodium: 141 mmol/L (ref 135–146)
TOTAL PROTEIN: 6.9 g/dL (ref 6.1–8.1)

## 2017-09-12 LAB — LIPID PANEL
CHOL/HDL RATIO: 3.9 (calc) (ref <5.0)
CHOLESTEROL: 135 mg/dL (ref <200)
HDL: 35 mg/dL — ABNORMAL LOW (ref >40)
LDL CHOLESTEROL (CALC): 80 mg/dL
Non-HDL Cholesterol (Calc): 100 mg/dL (calc) (ref <130)
TRIGLYCERIDES: 104 mg/dL (ref <150)

## 2017-09-12 LAB — VITAMIN D 25 HYDROXY (VIT D DEFICIENCY, FRACTURES): VIT D 25 HYDROXY: 36 ng/mL (ref 30–100)

## 2017-09-12 LAB — PSA: PSA: 3 ng/mL (ref <=4.0)

## 2017-09-18 ENCOUNTER — Ambulatory Visit (INDEPENDENT_AMBULATORY_CARE_PROVIDER_SITE_OTHER): Payer: 59 | Admitting: Family Medicine

## 2017-09-18 ENCOUNTER — Encounter: Payer: Self-pay | Admitting: Family Medicine

## 2017-09-18 VITALS — BP 120/64 | HR 81 | Temp 98.2°F | Ht 73.0 in | Wt 245.0 lb

## 2017-09-18 DIAGNOSIS — R140 Abdominal distension (gaseous): Secondary | ICD-10-CM

## 2017-09-18 DIAGNOSIS — R972 Elevated prostate specific antigen [PSA]: Secondary | ICD-10-CM | POA: Diagnosis not present

## 2017-09-18 DIAGNOSIS — N183 Chronic kidney disease, stage 3 unspecified: Secondary | ICD-10-CM

## 2017-09-18 DIAGNOSIS — G40109 Localization-related (focal) (partial) symptomatic epilepsy and epileptic syndromes with simple partial seizures, not intractable, without status epilepticus: Secondary | ICD-10-CM

## 2017-09-18 DIAGNOSIS — E559 Vitamin D deficiency, unspecified: Secondary | ICD-10-CM | POA: Diagnosis not present

## 2017-09-18 DIAGNOSIS — E669 Obesity, unspecified: Secondary | ICD-10-CM | POA: Diagnosis not present

## 2017-09-18 DIAGNOSIS — G40209 Localization-related (focal) (partial) symptomatic epilepsy and epileptic syndromes with complex partial seizures, not intractable, without status epilepticus: Secondary | ICD-10-CM | POA: Diagnosis not present

## 2017-09-18 DIAGNOSIS — Z0001 Encounter for general adult medical examination with abnormal findings: Secondary | ICD-10-CM

## 2017-09-18 DIAGNOSIS — I10 Essential (primary) hypertension: Secondary | ICD-10-CM

## 2017-09-18 DIAGNOSIS — G4733 Obstructive sleep apnea (adult) (pediatric): Secondary | ICD-10-CM

## 2017-09-18 DIAGNOSIS — E1121 Type 2 diabetes mellitus with diabetic nephropathy: Secondary | ICD-10-CM

## 2017-09-18 DIAGNOSIS — E785 Hyperlipidemia, unspecified: Secondary | ICD-10-CM

## 2017-09-18 DIAGNOSIS — E1122 Type 2 diabetes mellitus with diabetic chronic kidney disease: Secondary | ICD-10-CM | POA: Diagnosis not present

## 2017-09-18 DIAGNOSIS — E538 Deficiency of other specified B group vitamins: Secondary | ICD-10-CM

## 2017-09-18 DIAGNOSIS — R7989 Other specified abnormal findings of blood chemistry: Secondary | ICD-10-CM

## 2017-09-18 DIAGNOSIS — E66811 Obesity, class 1: Secondary | ICD-10-CM

## 2017-09-18 MED ORDER — B COMPLEX PO TABS: 1.0000 | ORAL_TABLET | ORAL | Status: DC

## 2017-09-18 NOTE — Assessment & Plan Note (Addendum)
Reviewed diagnosis with patient. Saw renal x1 (2016). Stable since then. rec avoid nephrotoxic meds, ensure good hydration (he does this).  eGFR today 50.  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

## 2017-09-18 NOTE — Assessment & Plan Note (Addendum)
Reviewed healthy diet and lifestyle choices for sustainable weight loss. He continues losing weight but has trouble losing any from gut. Frustrated with this. On exam he does have marked central obesity ?distension. Encouraged core strengthening. Check abd [place] to further eval distension.

## 2017-09-18 NOTE — Assessment & Plan Note (Signed)
Chronic, stable. Improved control from prior visit. Continue current regimen.

## 2017-09-18 NOTE — Progress Notes (Signed)
BP 120/64 (BP Location: Left Arm, Patient Position: Sitting, Cuff Size: Normal)   Pulse 81   Temp 98.2 F (36.8 C) (Oral)   Ht _0  (1.854 m)   Wt 245 lb (111.1 kg)   SpO2 96%   BMI 32.32 kg/m    CC: CPE Subjective:    Patient ID: Kirk Bennett., male    DOB: [birth year], 67 y.o.   MRN: [id number]  HPI: Kirk Bennett. is a 67 y.o. male presenting on 09/18/2017 for Annual Exam   OSA - saw Dr Mortimer Fries, declines treatment at this time.   Seizure disorder - on keppra 529m 2 in am and 3 in pm.  DM - followed by Dr SGabriel CarinaPRN, here regularly. On farxiga, amaryl, metformin. Sugars running ok. Known diabetic nephropathy.   Preventative: COLONOSCOPY WITH PROPOFOL 12/11/2015 mult polyps, few TA, rpt 317yr(SGustavo LahProstate cancer screening - PSA increased to 4.4 last year, referred to urology with stable Kirk and decreased PSA to 2s. Rec yearly monitoring at urology.  Flu shot yearly Tdap - 04/2011 Pneumovax 2013 shingrix - discussed Had hepatitis at age [age]yo?A), hospitalized [facility], [place].  No hep B in past.  Seat belt use discussed.  Sunscreen use discussed. No suspicious moles on skin. Non smoker Alcohol - none  Lives with wife and granddaughter, 1 cat Occupation: retired, worked aiOncologistt school Activity: walking regularly  Diet: good water, good vegetables, follows diabetic diet   Relevant past medical, surgical, family and social history reviewed and updated as indicated. Interim medical history since our last visit reviewed. Allergies and medications reviewed and updated. Outpatient Medications Prior to Visit  Medication Sig Dispense Refill  . atorvastatin (LIPITOR) 20 MG tablet TAKE 1 TABLET (20 MG TOTAL) BY MOUTH DAILY. 90 tablet 0  . Cholecalciferol (VITAMIN D3) 1000 units CAPS Take 1,000 Units by mouth daily.    . Marland KitchenARXIGA 10 MG TABS tablet TAKE 1 TABLET (10 MG TOTAL) BY MOUTH EVERY MORNING. 90 tablet 1  . fluticasone (FLONASE) 50  MCG/ACT nasal spray Place 2 sprays into both nostrils daily. 16 g 1  . glimepiride (AMARYL) 2 MG tablet TAKE 1 TABLET BY MOUTH EVERY OTHER DAY BEFORE BREAKFAST 90 tablet 1  . glucose blood (ONE TOUCH ULTRA TEST) test strip Use to check sugar fasting in the AM and 2 hours after lunch or supper. Dx: E11.21 100 each 3  . levETIRAcetam (KEPPRA) 500 MG tablet Take 500 mg by mouth as directed. 2 tab AM, 3 tab PM    . lisinopril (PRINIVIL,ZESTRIL) 10 MG tablet TAKE 1 TABLET (10 MG TOTAL) BY MOUTH 2 (TWO) TIMES DAILY. 180 tablet 1  . metFORMIN (GLUCOPHAGE) 1000 MG tablet TAKE 1 TABLET BY MOUTH TWICE A DAY WITH A MEAL 180 tablet 1  . tamsulosin (FLOMAX) 0.4 MG CAPS capsule TAKE 1 CAPSULE (0.4 MG TOTAL) BY MOUTH DAILY. 90 capsule 1  . b complex vitamins tablet Take 1 tablet by mouth daily.    . Marland Kitchentorvastatin (LIPITOR) 20 MG tablet Take 1 tablet (20 mg total) by mouth daily. 90 tablet 3   Facility-Administered Medications Prior to Visit  Medication Dose Route Frequency Provider Last Rate Last Dose  . triamcinolone acetonide (KENALOG) 10 MG/ML injection 10 mg  10 mg Other Once StLandis MartinsDPM         Per HPI unless specifically indicated in ROS section below Review of Systems  Constitutional: Negative for activity change, appetite change, chills,  fatigue, fever and unexpected weight change.  HENT: Negative for hearing loss.   Eyes: Negative for visual disturbance.  Respiratory: Negative for cough, chest tightness, shortness of breath and wheezing.   Cardiovascular: Negative for chest pain, palpitations and leg swelling.  Gastrointestinal: Negative for abdominal distention, abdominal pain, blood in stool, constipation, diarrhea, nausea and vomiting.  Genitourinary: Negative for difficulty urinating and hematuria.  Musculoskeletal: Negative for arthralgias, myalgias and neck pain.  Skin: Negative for rash.  Neurological: Negative for dizziness, seizures, syncope and headaches.  Hematological:  Negative for adenopathy. Does not bruise/bleed easily.  Psychiatric/Behavioral: Negative for dysphoric mood. The patient is not nervous/anxious.        Objective:    BP 120/64 (BP Location: Left Arm, Patient Position: Sitting, Cuff Size: Normal)   Pulse 81   Temp 98.2 F (36.8 C) (Oral)   Ht _0  (1.854 m)   Wt 245 lb (111.1 kg)   SpO2 96%   BMI 32.32 kg/m   Wt Readings from Last 3 Encounters:  09/18/17 245 lb (111.1 kg)  08/29/17 248 lb (112.5 kg)  03/21/17 249 lb (112.9 kg)    Physical Exam  Constitutional: He is oriented to person, place, and time. He appears well-developed and well-nourished. No distress.  HENT:  Head: Normocephalic and atraumatic.  Right Ear: Hearing, tympanic membrane, external ear and ear canal normal.  Left Ear: Hearing, tympanic membrane, external ear and ear canal normal.  Nose: Nose normal.  Mouth/Throat: Uvula is midline, oropharynx is clear and moist and mucous membranes are normal. No oropharyngeal exudate, posterior oropharyngeal edema or posterior oropharyngeal erythema.  Eyes: Conjunctivae and EOM are normal. Pupils are equal, round, and reactive to light. No scleral icterus.  Neck: Normal range of motion. Neck supple. No thyromegaly present.  Cardiovascular: Normal rate, regular rhythm, normal heart sounds and intact distal pulses.  No murmur heard. Pulses:      Radial pulses are 2+ on the right side, and 2+ on the left side.  Pulmonary/Chest: Effort normal and breath sounds normal. No respiratory distress. He has no wheezes. He has no rales.  Abdominal: Soft. Bowel sounds are normal. He exhibits distension. He exhibits no fluid wave and no mass. There is no tenderness. There is no rigidity, no rebound, no guarding and negative Murphy's sign.  Central obesity  Musculoskeletal: Normal range of motion. He exhibits no edema.  Lymphadenopathy:    He has no cervical adenopathy.  Neurological: He is alert and oriented to person, place, and time.    CN grossly intact, station and gait intact  Skin: Skin is warm and dry. No rash noted.  Psychiatric: He has a normal mood and affect. His behavior is normal. Judgment and thought content normal.  Nursing note and vitals reviewed.  Results for orders placed or performed in visit on 09/11/17  Lipid panel  Result Value Ref Range   Cholesterol 135 <200 mg/dL   HDL 35 (L) >40 mg/dL   Triglycerides 104 <150 mg/dL   LDL Cholesterol (Calc) 80 mg/dL (calc)   Total CHOL/HDL Ratio 3.9 <5.0 (calc)   Non-HDL Cholesterol (Calc) 100 <130 mg/dL (calc)  Comprehensive metabolic panel  Result Value Ref Range   Glucose, Bld 124 (H) 65 - 99 mg/dL   BUN 16 7 - 25 mg/dL   Creat 1.65 (H) 0.70 - 1.25 mg/dL   BUN/Creatinine Ratio 10 6 - 22 (calc)   Sodium 141 135 - 146 mmol/L   Potassium 4.3 3.5 - 5.3 mmol/L  Chloride 105 98 - 110 mmol/L   CO2 24 20 - 32 mmol/L   Calcium 9.6 8.6 - 10.3 mg/dL   Total Protein 6.9 6.1 - 8.1 g/dL   Albumin 4.3 3.6 - 5.1 g/dL   Globulin 2.6 1.9 - 3.7 g/dL (calc)   AG Ratio 1.7 1.0 - 2.5 (calc)   Total Bilirubin 1.0 0.2 - 1.2 mg/dL   Alkaline phosphatase (APISO) 103 40 - 115 U/L   AST 14 10 - 35 U/L   ALT 12 9 - 46 U/L  Hemoglobin A1c  Result Value Ref Range   Hgb A1c MFr Bld 6.9 (H) <5.7 % of total Hgb   Mean Plasma Glucose 151 (calc)   eAG (mmol/L) 8.4 (calc)  PSA  Result Value Ref Range   PSA 3.0 < OR = 4.0 ng/mL  VITAMIN D 25 Hydroxy (Vit-D Deficiency, Fractures)  Result Value Ref Range   Vit D, 25-Hydroxy 36 30 - 100 ng/mL  Vitamin B12  Result Value Ref Range   Vitamin B-12 1,749 (H) 200 - 1,100 pg/mL      Assessment & Plan:   Problem List Items Addressed This Visit    CKD stage 3 due to type 2 diabetes mellitus ([place])    Reviewed diagnosis with patient. Saw renal x1 (2016). Stable since then. rec avoid nephrotoxic meds, ensure good hydration (he does this).  eGFR today 50.  Discussed hep B shots - pt states he had hepatitis as a child - will check for  immunity prior to providing.      Relevant Orders   US Abdomen Complete   Controlled type 2 diabetes mellitus with diabetic nephropathy (HCC)    Chronic, stable. Improved control from prior visit. Continue current regimen.       Elevated PSA    Levels back to normal. Continues yearly f/u with urology Karsten Ro)      Encounter for general adult medical examination with abnormal findings - Primary    Preventative protocols reviewed and updated unless pt declined. Discussed healthy diet and lifestyle.       Essential hypertension, benign    Chronic, stable. Continue current regimen.       Hyperlipidemia    Chronic, stable. Continue lipitor 55m. 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 Continue keppra      Low vitamin B12 level    Level now high - will decrease b complex to QOD.      Obesity, Class I, BMI 30.0-34.9 (see actual BMI)    Reviewed healthy diet and lifestyle choices for sustainable weight loss. He continues losing weight but has trouble losing any from gut. Frustrated with this. On exam he does have marked central obesity ?distension. Encouraged core strengthening. Check abd [place] further eval distension.       OSA (obstructive sleep apnea)    S/p pulm eval. Not interested in CPAP at this time.  Will check with dentist for other options.       Partial epilepsy with impairment of consciousness (HCC)   Vitamin D deficiency    Continue 1000 IU daily.       Other Visit Diagnoses    Abdominal distension       Relevant Orders   US Abdomen  Complete       Follow up plan: Return in about 6 months (around 03/18/2018) for follow up visit.  Ria Bush, MD

## 2017-09-18 NOTE — Assessment & Plan Note (Signed)
Continue 1000 IU daily. 

## 2017-09-18 NOTE — Assessment & Plan Note (Signed)
[place] neurology care. Continue keppra

## 2017-09-18 NOTE — Assessment & Plan Note (Signed)
S/p pulm eval. Not interested in CPAP at this time.  Will check with dentist for other options.

## 2017-09-18 NOTE — Assessment & Plan Note (Signed)
Level now high - will decrease b complex to QOD.

## 2017-09-18 NOTE — Assessment & Plan Note (Signed)
Chronic, stable. Continue lipitor 20mg . The 10-year ASCVD risk score Mikey Bussing DC Brooke Bonito., et al., 2013) is: 24.6%   Values used to calculate the score:     Age: 67 years     Sex: Male     Is Non-Hispanic African American: Yes     Diabetic: Yes     Tobacco smoker: No     Systolic Blood Pressure: 935 mmHg     Is BP treated: Yes     HDL Cholesterol: 35 mg/dL     Total Cholesterol: 135 mg/dL

## 2017-09-18 NOTE — Assessment & Plan Note (Signed)
Preventative protocols reviewed and updated unless pt declined. Discussed healthy diet and lifestyle.  

## 2017-09-18 NOTE — Assessment & Plan Note (Signed)
Levels back to normal. Continues yearly f/u with urology Karsten Ro)

## 2017-09-18 NOTE — Patient Instructions (Addendum)
If interested, check with pharmacy about new 2 shot shingles series (shingrix).  We will check abdominal ultrasound.  Continue working on [employer] and exercise. Look into core strengthening.  Return in 6 months for follow up visit, sooner if needed.   Health Maintenance, Male A healthy lifestyle and preventive care is important for your health and wellness. Ask your health care provider about what schedule of regular examinations is right for you. What should I know about weight and diet? Eat a Healthy Diet  Eat plenty of vegetables, fruits, whole grains, low-fat dairy products, and lean protein.  Do not eat a lot of foods high in solid fats, added sugars, or salt.  Maintain a Healthy Weight Regular exercise can help you achieve or maintain a healthy weight. You should:  Do at least 150 minutes of exercise each week. The exercise should increase your heart rate and make you sweat (moderate-intensity exercise).  Do strength-training exercises at least twice a week.  Watch Your Levels of Cholesterol and Blood Lipids  Have your blood tested for lipids and cholesterol every 5 years starting at 65 years of age. If you are at high risk for heart disease, you should start having your blood tested when you are 65 years old. You may need to have your cholesterol levels checked more often if: ? Your lipid or cholesterol levels are high. ? You are older than 65 years of age. ? You are at high risk for heart disease.  What should I know about cancer screening? Many types of cancers can be detected early and may often be prevented. Lung Cancer  You should be screened every year for lung cancer if: ? You are a current smoker who has smoked for at least 30 years. ? You are a former smoker who has quit within the past 15 years.  Talk to your health care provider about your screening options, when you should start screening, and how often you should be screened.  Colorectal Cancer  Routine  colorectal cancer screening usually begins at 65 years of age and should be repeated every 5-10 years until you are 65 years old. You may need to be screened more often if early forms of precancerous polyps or small growths are found. Your health care provider may recommend screening at an earlier age if you have risk factors for colon cancer.  Your health care provider may recommend using home test kits to check for hidden blood in the stool.  A small camera at the end of a tube can be used to examine your colon (sigmoidoscopy or colonoscopy). This checks for the earliest forms of colorectal cancer.  Prostate and Testicular Cancer  Depending on your age and overall health, your health care provider may do certain tests to screen for prostate and testicular cancer.  Talk to your health care provider about any symptoms or concerns you have about testicular or prostate cancer.  Skin Cancer  Check your skin from head to toe regularly.  Tell your health care provider about any new moles or changes in moles, especially if: ? There is a change in a mole's size, shape, or color. ? You have a mole that is larger than a pencil eraser.  Always use sunscreen. Apply sunscreen liberally and repeat throughout the day.  Protect yourself by wearing long sleeves, pants, a wide-brimmed hat, and sunglasses when outside.  What should I know about heart disease, diabetes, and high blood pressure?  If you are 18-39 years of  age, have your blood pressure checked every 3-5 years. If you are 29 years of age or older, have your blood pressure checked every year. You should have your blood pressure measured twice-once when you are at a hospital or clinic, and once when you are not at a hospital or clinic. Record the average of the two measurements. To check your blood pressure when you are not at a hospital or clinic, you can use: ? An automated blood pressure machine at a pharmacy. ? A home blood pressure  monitor.  Talk to your health care provider about your target blood pressure.  If you are between 7-67 years old, ask your health care provider if you should take aspirin to prevent heart disease.  Have regular diabetes screenings by checking your fasting blood sugar level. ? If you are at a normal weight and have a low risk for diabetes, have this test once every three years after the age of 40. ? If you are overweight and have a high risk for diabetes, consider being tested at a younger age or more often.  A one-time screening for abdominal aortic aneurysm (AAA) by ultrasound is recommended for men aged 86-75 years who are current or former smokers. What should I know about preventing infection? Hepatitis B If you have a higher risk for hepatitis B, you should be screened for this virus. Talk with your health care provider to find out if you are at risk for hepatitis B infection. Hepatitis C Blood testing is recommended for:  Everyone born from [birth year] through 1963.  Anyone with known risk factors for hepatitis C.  Sexually Transmitted Diseases (STDs)  You should be screened each year for STDs including gonorrhea and chlamydia if: ? You are sexually active and are younger than 65 years of age. ? You are older than 65 years of age and your health care provider tells you that you are at risk for this type of infection. ? Your sexual activity has changed since you were last screened and you are at an increased risk for chlamydia or gonorrhea. Ask your health care provider if you are at risk.  Talk with your health care provider about whether you are at high risk of being infected with HIV. Your health care provider may recommend a prescription medicine to help prevent HIV infection.  What else can I do?  Schedule regular health, dental, and eye exams.  Stay current with your vaccines (immunizations).  Do not use any tobacco products, such as cigarettes, chewing tobacco, and  e-cigarettes. If you need help quitting, ask your health care provider.  Limit alcohol intake to no more than 2 drinks per day. One drink equals 12 ounces of beer, 5 ounces of wine, or 1 ounces of hard liquor.  Do not use street drugs.  Do not share needles.  Ask your health care provider for help if you need support or information about quitting drugs.  Tell your health care provider if you often feel depressed.  Tell your health care provider if you have ever been abused or do not feel safe at home. This information is not intended to replace advice given to you by your health care provider. Make sure you discuss any questions you have with your health care provider. Document Released: 02/04/2008 Document Revised: 04/06/2016 Document Reviewed: 05/12/2015 Elsevier Interactive Patient Education  [employer].

## 2017-09-18 NOTE — Assessment & Plan Note (Signed)
Chronic, stable. Continue current regimen. 

## 2017-09-27 ENCOUNTER — Ambulatory Visit: Payer: 59

## 2017-09-28 ENCOUNTER — Ambulatory Visit
Admission: RE | Admit: 2017-09-28 | Discharge: 2017-09-28 | Disposition: A | Discharge status: Home / Self Care | Payer: 59 | Source: Ambulatory Visit | Attending: Family Medicine | Admitting: Family Medicine

## 2017-09-28 DIAGNOSIS — R140 Abdominal distension (gaseous): Secondary | ICD-10-CM | POA: Diagnosis not present

## 2017-09-28 DIAGNOSIS — E1122 Type 2 diabetes mellitus with diabetic chronic kidney disease: Secondary | ICD-10-CM | POA: Insufficient documentation

## 2017-09-28 DIAGNOSIS — N183 Chronic kidney disease, stage 3 (moderate): Secondary | ICD-10-CM | POA: Insufficient documentation

## 2017-09-28 DIAGNOSIS — K760 Fatty (change of) liver, not elsewhere classified: Secondary | ICD-10-CM | POA: Insufficient documentation

## 2017-09-29 ENCOUNTER — Other Ambulatory Visit: Payer: Self-pay | Admitting: Family Medicine

## 2017-09-30 ENCOUNTER — Encounter: Payer: Self-pay | Admitting: Family Medicine

## 2017-09-30 DIAGNOSIS — K760 Fatty (change of) liver, not elsewhere classified: Secondary | ICD-10-CM | POA: Insufficient documentation

## 2017-10-18 ENCOUNTER — Other Ambulatory Visit: Payer: Self-pay | Admitting: Family Medicine

## 2017-10-23 ENCOUNTER — Other Ambulatory Visit: Payer: Self-pay | Admitting: Family Medicine

## 2017-11-03 ENCOUNTER — Ambulatory Visit (INDEPENDENT_AMBULATORY_CARE_PROVIDER_SITE_OTHER): Payer: 59 | Admitting: Family Medicine

## 2017-11-03 ENCOUNTER — Encounter: Payer: Self-pay | Admitting: Family Medicine

## 2017-11-03 ENCOUNTER — Other Ambulatory Visit: Payer: Self-pay | Admitting: Family Medicine

## 2017-11-03 VITALS — BP 122/74 | HR 81 | Temp 95.0°F | Wt 251.0 lb

## 2017-11-03 DIAGNOSIS — M25512 Pain in left shoulder: Secondary | ICD-10-CM | POA: Insufficient documentation

## 2017-11-03 DIAGNOSIS — H6502 Acute serous otitis media, left ear: Secondary | ICD-10-CM | POA: Insufficient documentation

## 2017-11-03 NOTE — Assessment & Plan Note (Addendum)
Anticipate RTC tendonitis - reviewed with patient. Avoid NSAIDs in CKD. rec ice/heating pad with discussion on how to use. Provided with exercises from [facility] pt advisor on RTC injury along with resistance band. If no better, update [place] for PT referral. Pt agrees with plan.

## 2017-11-03 NOTE — Assessment & Plan Note (Signed)
No signs of bacterial infection at this time.  Supportive care reviewed. rec start flonase.  Discussed anticipated course of resolution.

## 2017-11-03 NOTE — Patient Instructions (Signed)
For shoulder - I think you have rotator cuff injury/tendonitis with some arthritis as well. Do exercises provided today. If no better with this, let me know for physical therapy course.  I think you do have congestion leading to ear symptoms and dizziness. Start using flonase nasal steroid. No signs of infection at this time. Update me with effect.

## 2017-11-03 NOTE — Progress Notes (Signed)
BP 122/74 (BP Location: Left Arm, Patient Position: Sitting, Cuff Size: Large)   Pulse 81   Temp (!) 95 F (35 C) (Oral)   Wt 251 lb (113.9 kg)   SpO2 95%   BMI 33.12 kg/m    CC: dizziness Subjective:    Patient ID: Kirk Buttery., male    DOB: [birth year], 67 y.o.   MRN: [id number]  HPI: Kirk Leisinger. is a 67 y.o. male presenting on 11/03/2017 for Dizziness (Thinks may have inner ear infeciton in left ear. Ear sounds muffled. Started about 4 days ago. Has recently had nasal congestion/drainage.)   5d h/o nasal congestion, associated with L ear muffled hearing and dizziness when standing. No drainage from ear, no recent swimming, no pain. No tinnitus. Minimal cough, no fevers/chills. Some sneezing. Dizziness described as unsteadiness, not presyncope, vertigo or lightheadedness.   Has tried tylenol sinus - this has helped.  15 yrs ago did have ear infection but no trouble since.   Several week h/o L shoulder ache, worse with moving arm backwards or with laying on left side. Tylenol hasn't helped shoulder.  Lab Results  Component Value Date   CREATININE 1.65 (H) 09/11/2017     Relevant past medical, surgical, family and social history reviewed and updated as indicated. Interim medical history since our last visit reviewed. Allergies and medications reviewed and updated. Outpatient Medications Prior to Visit  Medication Sig Dispense Refill  . atorvastatin (LIPITOR) 20 MG tablet TAKE 1 TABLET (20 MG TOTAL) BY MOUTH DAILY. 90 tablet 0  . b complex vitamins tablet Take 1 tablet by mouth every other day.    . Cholecalciferol (VITAMIN D3) 1000 units CAPS Take 1,000 Units by mouth daily.    Marland Kitchen FARXIGA 10 MG TABS tablet TAKE 1 TABLET (10 MG TOTAL) BY MOUTH EVERY MORNING. 90 tablet 1  . fluticasone (FLONASE) 50 MCG/ACT nasal spray Place 2 sprays into both nostrils daily. 16 g 1  . glimepiride (AMARYL) 2 MG tablet TAKE 1 TABLET BY MOUTH EVERY OTHER DAY BEFORE BREAKFAST 90 tablet 1    . glimepiride (AMARYL) 2 MG tablet TAKE 1 TABLET BY MOUTH EVERY MORNING WITH BREAKFAST 90 tablet 1  . glucose blood (ONE TOUCH ULTRA TEST) test strip Use to check sugar fasting in the AM and 2 hours after lunch or supper. Dx: E11.21 100 each 3  . levETIRAcetam (KEPPRA) 500 MG tablet Take 500 mg by mouth as directed. 2 tab AM, 3 tab PM    . lisinopril (PRINIVIL,ZESTRIL) 10 MG tablet TAKE 1 TABLET (10 MG TOTAL) BY MOUTH 2 (TWO) TIMES DAILY. 180 tablet 1  . metFORMIN (GLUCOPHAGE) 1000 MG tablet TAKE 1 TABLET BY MOUTH TWICE A DAY WITH A MEAL 180 tablet 1  . tamsulosin (FLOMAX) 0.4 MG CAPS capsule TAKE 1 CAPSULE (0.4 MG TOTAL) BY MOUTH DAILY. 90 capsule 1   Facility-Administered Medications Prior to Visit  Medication Dose Route Frequency Provider Last Rate Last Dose  . triamcinolone acetonide (KENALOG) 10 MG/ML injection 10 mg  10 mg Other Once Landis Martins, DPM         Per HPI unless specifically indicated in ROS section below Review of Systems     Objective:    BP 122/74 (BP Location: Left Arm, Patient Position: Sitting, Cuff Size: Large)   Pulse 81   Temp (!) 95 F (35 C) (Oral)   Wt 251 lb (113.9 kg)   SpO2 95%   BMI 33.12 kg/m   Wt  Readings from Last 3 Encounters:  11/03/17 251 lb (113.9 kg)  09/18/17 245 lb (111.1 kg)  08/29/17 248 lb (112.5 kg)    Physical Exam  Constitutional: He appears well-developed and well-nourished. No distress.  HENT:  Head: Normocephalic and atraumatic.  Right Ear: Hearing, tympanic membrane, external ear and ear canal normal.  Left Ear: Hearing, tympanic membrane, external ear and ear canal normal.  Nose: Mucosal edema (L>R, mild) present. No rhinorrhea. Right sinus exhibits no maxillary sinus tenderness and no frontal sinus tenderness. Left sinus exhibits no maxillary sinus tenderness and no frontal sinus tenderness.  Mouth/Throat: Uvula is midline, oropharynx is clear and moist and mucous membranes are normal. No oropharyngeal exudate,  posterior oropharyngeal edema, posterior oropharyngeal erythema or tonsillar abscesses.  TMs clear, canals clear bilaterally  Eyes: Conjunctivae and EOM are normal. Pupils are equal, round, and reactive to light. No scleral icterus.  Neck: Normal range of motion. Neck supple.  Musculoskeletal: He exhibits no edema.  R shoulder WNL L shoulder exam: No deformity of shoulders on inspection.  Discomfort to palpation AC joint and posterior bursa FROM in abduction and forward flexion, somewhat limited due to pain. + pain and mild weakness with testing SITS in ext/int rotation. + pain with empty can sign. + Speed test. Discomfort with impingement test Discomfort pain with rotation of humeral head in [facility] joint.   Lymphadenopathy:    He has no cervical adenopathy.  Skin: Skin is warm and dry. No rash noted.  Nursing note and vitals reviewed.     Assessment & Plan:   Problem List Items Addressed This Visit    Acute pain of left shoulder    Anticipate RTC tendonitis - reviewed with patient. Avoid NSAIDs in CKD. rec ice/heating pad with discussion on how to use. Provided with exercises from [facility] pt advisor on RTC injury along with resistance band. If no better, update [place] for PT referral. Pt agrees with plan.      Acute serous otitis media of left ear without rupture - Primary    No signs of bacterial infection at this time.  Supportive care reviewed. rec start flonase.  Discussed anticipated course of resolution.           No orders of the defined types were placed in this encounter.  No orders of the defined types were placed in this encounter.   Follow up plan: Return if symptoms worsen or fail to improve.  Ria Bush, MD

## 2017-12-07 ENCOUNTER — Other Ambulatory Visit: Payer: Self-pay | Admitting: Family Medicine

## 2017-12-30 ENCOUNTER — Other Ambulatory Visit: Payer: Self-pay | Admitting: Family Medicine

## 2018-01-04 ENCOUNTER — Ambulatory Visit: Payer: Self-pay | Admitting: *Deleted

## 2018-01-04 NOTE — Telephone Encounter (Signed)
Pt having severe dizziness which started last night and has gotten worse this morning. He is unable to get oob because of it. No other symptoms, no headache, nausea or vomiting, no earache or weakness.  Advised to call 911 to go to the emergency department to be seen per protocol.  He is going to [facility] Will notify flow at [facility] care at [facility].  Reason for Disposition . [1] Dizziness (vertigo) present now AND [2] age > 26  (Exception: prior physician evaluation for this AND no different/worse than usual)  Answer Assessment - Initial Assessment Questions 1. DESCRIPTION: "Describe your dizziness."     Room spinning 2. VERTIGO: "Do you feel like either you or the room is spinning or tilting?"      same 3. LIGHTHEADED: "Do you feel lightheaded?" (e.g., somewhat faint, woozy, weak upon standing)     Weak upon standing 4. SEVERITY: "How bad is it?"  "Can you walk?"   - MILD - Feels unsteady but walking normally.   - MODERATE - Feels very unsteady when walking, but not falling; interferes with normal activities (e.g., school, work) .   - SEVERE - Unable to walk without falling (requires assistance).     severe 5. ONSET:  "When did the dizziness begin?"     Last night 6. AGGRAVATING FACTORS: "Does anything make it worse?" (e.g., standing, change in head position)     standing 7. CAUSE: "What do you think is causing the dizziness?"     Not sure 8. RECURRENT SYMPTOM: "Have you had dizziness before?" If so, ask: "When was the last time?" "What happened that time?"     never 9. OTHER SYMPTOMS: "Do you have any other symptoms?" (e.g., headache, weakness, numbness, vomiting, earache)     no 10. PREGNANCY: "Is there any chance you are pregnant?" "When was your last menstrual period?"       no  Protocols used: DIZZINESS - VERTIGO-A-AH

## 2018-01-05 ENCOUNTER — Encounter: Payer: Self-pay | Admitting: Family Medicine

## 2018-01-05 ENCOUNTER — Ambulatory Visit (INDEPENDENT_AMBULATORY_CARE_PROVIDER_SITE_OTHER): Payer: 59 | Admitting: Family Medicine

## 2018-01-05 VITALS — BP 130/68 | Temp 97.9°F | Ht 73.0 in | Wt 251.5 lb

## 2018-01-05 DIAGNOSIS — E1122 Type 2 diabetes mellitus with diabetic chronic kidney disease: Secondary | ICD-10-CM

## 2018-01-05 DIAGNOSIS — N183 Chronic kidney disease, stage 3 (moderate): Secondary | ICD-10-CM | POA: Diagnosis not present

## 2018-01-05 DIAGNOSIS — I10 Essential (primary) hypertension: Secondary | ICD-10-CM

## 2018-01-05 DIAGNOSIS — Z8619 Personal history of other infectious and parasitic diseases: Secondary | ICD-10-CM | POA: Insufficient documentation

## 2018-01-05 DIAGNOSIS — E1121 Type 2 diabetes mellitus with diabetic nephropathy: Secondary | ICD-10-CM | POA: Diagnosis not present

## 2018-01-05 DIAGNOSIS — I444 Left anterior fascicular block: Secondary | ICD-10-CM | POA: Diagnosis not present

## 2018-01-05 LAB — RENAL FUNCTION PANEL
ALBUMIN: 4.1 g/dL (ref 3.5–5.2)
BUN: 21 mg/dL (ref 6–23)
CALCIUM: 9.2 mg/dL (ref 8.4–10.5)
CO2: 27 mEq/L (ref 19–32)
Chloride: 103 mEq/L (ref 96–112)
Creatinine, Ser: 1.7 mg/dL — ABNORMAL HIGH (ref 0.40–1.50)
GFR: 52.3 mL/min — AB (ref >60.00)
GLUCOSE: 252 mg/dL — AB (ref 70–99)
POTASSIUM: 4.1 meq/L (ref 3.5–5.1)
Phosphorus: 3.1 mg/dL (ref 2.3–4.6)
Sodium: 138 mEq/L (ref 135–145)

## 2018-01-05 LAB — HEMOGLOBIN A1C: Hgb A1c MFr Bld: 7.2 % — ABNORMAL HIGH (ref 4.6–6.5)

## 2018-01-05 MED ORDER — METOPROLOL SUCCINATE ER 25 MG PO TB24: 12.5000 mg | ORAL_TABLET | Freq: Every day | ORAL | 3 refills | Status: DC

## 2018-01-05 NOTE — Assessment & Plan Note (Signed)
Update renal panel 

## 2018-01-05 NOTE — Assessment & Plan Note (Addendum)
Anticipate deteriorated control based on recent recall cbg's. Increase water intake. Update A1c.

## 2018-01-05 NOTE — Patient Instructions (Addendum)
EKG overall stable today Vital signs ok today.  Labs today.  Increase water intake - by 1-2 8oz glasses per day Do this for next few days, continue monitoring blood pressures and heart rate and if persistently running elevated start second blood pressure medicine (toprol XL) 1/2 tablet daily.  Call me next week with update.

## 2018-01-05 NOTE — Assessment & Plan Note (Addendum)
Chronic, sudden worsening over the last few days, unknown reason. Will encouraged increased water intake to optimize hydration status and continue to monitor blood pressures. If staying elevated, provided with WASP for toprol XL to fill given elevated HR and PVCs noted today. I asked him to update me in 1 week with results of above plan. Pt agrees with plan.  EKG today.

## 2018-01-05 NOTE — Assessment & Plan Note (Signed)
Discussed EKG. New fascicular block with frequent PVCs. Will ensure good hydration status. WASP provided for beta blocker if needed. Some dizziness, but pt denies palpitations, headache, dyspnea. Reassess EKG at f/u visit.

## 2018-01-05 NOTE — Progress Notes (Signed)
BP 130/68 (BP Location: Right Arm, Cuff Size: Large)   Temp 97.9 F (36.6 C) (Oral)   Ht 6\' 1"  (1.854 m)   Wt 251 lb 8 oz (114.1 kg)   SpO2 95%   BMI 33.18 kg/m    CC: HTN Subjective:    Patient ID: Kirk Bennett., male    DOB: Jan 09, 1951, 68 y.o.   MRN: [id number]  HPI: Kirk Bennett. is a 67 y.o. male presenting on 01/05/2018 for Hypertension (Pt states he has had elevated home BP readings for the last couple of days. Has been as high as 332 systolic and 86 dyastolic. C/o dizziness and HA.)   Several days ago started noticing increased dizziness described as room spinning sensation worse with standing, mild HA this morning that has since resolved.   HTN - Compliant with current antihypertensive regimen of lisinopril 10mg  BID. Does regularly check blood pressures at home: recently 140s/80s. No low blood pressure readings. Denies vision changes, CP/tightness, SOB, leg swelling. No changes in diet, no caffeine changes, no exercise changes.   CBGs also elevated recently - 200s despite compliance with farxiga, amaryl, metformin.   Relevant past medical, surgical, family and social history reviewed and updated as indicated. Interim medical history since our last visit reviewed. Allergies and medications reviewed and updated. Outpatient Medications Prior to Visit  Medication Sig Dispense Refill  . atorvastatin (LIPITOR) 20 MG tablet TAKE 1 TABLET (20 MG TOTAL) BY MOUTH DAILY. 90 tablet 0  . b complex vitamins tablet Take 1 tablet by mouth every other day.    . Cholecalciferol (VITAMIN D3) 1000 units CAPS Take 1,000 Units by mouth daily.    Marland Kitchen FARXIGA 10 MG TABS tablet TAKE 1 TABLET (10 MG TOTAL) BY MOUTH EVERY MORNING. 90 tablet 1  . fluticasone (FLONASE) 50 MCG/ACT nasal spray Place 2 sprays into both nostrils daily. 16 g 1  . glimepiride (AMARYL) 2 MG tablet TAKE 1 TABLET BY MOUTH EVERY MORNING WITH BREAKFAST 90 tablet 1  . glucose blood (ONE TOUCH ULTRA TEST) test strip Use to  check sugar fasting in the AM and 2 hours after lunch or supper. Dx: E11.21 100 each 3  . levETIRAcetam (KEPPRA) 500 MG tablet Take 500 mg by mouth as directed. 2 tab AM, 3 tab PM    . lisinopril (PRINIVIL,ZESTRIL) 10 MG tablet TAKE 1 TABLET (10 MG TOTAL) BY MOUTH 2 (TWO) TIMES DAILY. 180 tablet 2  . metFORMIN (GLUCOPHAGE) 1000 MG tablet TAKE 1 TABLET BY MOUTH TWICE A DAY WITH A MEAL 180 tablet 1  . tamsulosin (FLOMAX) 0.4 MG CAPS capsule TAKE 1 CAPSULE (0.4 MG TOTAL) BY MOUTH DAILY. 90 capsule 1  . glimepiride (AMARYL) 2 MG tablet TAKE 1 TABLET BY MOUTH EVERY OTHER DAY BEFORE BREAKFAST 90 tablet 1   Facility-Administered Medications Prior to Visit  Medication Dose Route Frequency Provider Last Rate Last Dose  . triamcinolone acetonide (KENALOG) 10 MG/ML injection 10 mg  10 mg Other Once Landis Martins, DPM         Per HPI unless specifically indicated in ROS section below Review of Systems     Objective:    BP 130/68 (BP Location: Right Arm, Cuff Size: Large)   Temp 97.9 F (36.6 C) (Oral)   Ht 6\' 1"  (1.854 m)   Wt 251 lb 8 oz (114.1 kg)   SpO2 95%   BMI 33.18 kg/m   Wt Readings from Last 3 Encounters:  01/05/18 251 lb 8 oz (  114.1 kg)  11/03/17 251 lb (113.9 kg)  09/18/17 245 lb (111.1 kg)    Physical Exam  Constitutional: He appears well-developed and well-nourished. No distress.  HENT:  Mouth/Throat: Oropharynx is clear and moist. No oropharyngeal exudate.  Eyes: Pupils are equal, round, and reactive to light. Conjunctivae and EOM are normal.  Neck: Normal range of motion. Neck supple. No thyromegaly present.  Cardiovascular: Normal rate, regular rhythm and normal heart sounds.  No murmur heard. Pulmonary/Chest: Effort normal and breath sounds normal. No respiratory distress. He has no wheezes. He has no rales.  Musculoskeletal: He exhibits no edema.  Neurological: He is alert. He has normal strength. No cranial nerve deficit or sensory deficit. Coordination and gait  normal.  CN 2-12 intact EOMI FTN intact  Skin: Skin is warm.  Psychiatric: He has a normal mood and affect.  Nursing note and vitals reviewed.  Results for orders placed or performed in visit on 09/11/17  Lipid panel  Result Value Ref Range   Cholesterol 135 <200 mg/dL   HDL 35 (L) >40 mg/dL   Triglycerides 104 <150 mg/dL   LDL Cholesterol (Calc) 80 mg/dL (calc)   Total CHOL/HDL Ratio 3.9 <5.0 (calc)   Non-HDL Cholesterol (Calc) 100 <130 mg/dL (calc)  Comprehensive metabolic panel  Result Value Ref Range   Glucose, Bld 124 (H) 65 - 99 mg/dL   BUN 16 7 - 25 mg/dL   Creat 1.65 (H) 0.70 - 1.25 mg/dL   BUN/Creatinine Ratio 10 6 - 22 (calc)   Sodium 141 135 - 146 mmol/L   Potassium 4.3 3.5 - 5.3 mmol/L   Chloride 105 98 - 110 mmol/L   CO2 24 20 - 32 mmol/L   Calcium 9.6 8.6 - 10.3 mg/dL   Total Protein 6.9 6.1 - 8.1 g/dL   Albumin 4.3 3.6 - 5.1 g/dL   Globulin 2.6 1.9 - 3.7 g/dL (calc)   AG Ratio 1.7 1.0 - 2.5 (calc)   Total Bilirubin 1.0 0.2 - 1.2 mg/dL   Alkaline phosphatase (APISO) 103 40 - 115 U/L   AST 14 10 - 35 U/L   ALT 12 9 - 46 U/L  Hemoglobin A1c  Result Value Ref Range   Hgb A1c MFr Bld 6.9 (H) <5.7 % of total Hgb   Mean Plasma Glucose 151 (calc)   eAG (mmol/L) 8.4 (calc)  PSA  Result Value Ref Range   PSA 3.0 < OR = 4.0 ng/mL  VITAMIN D 25 Hydroxy (Vit-D Deficiency, Fractures)  Result Value Ref Range   Vit D, 25-Hydroxy 36 30 - 100 ng/mL  Vitamin B12  Result Value Ref Range   Vitamin B-12 1,749 (H) 200 - 1,100 pg/mL   EKG - NSR rate almost 100, LAD with LAFB, normal intervals, no acute ST/T changes, frequent PVCs    Assessment & Plan:   Problem List Items Addressed This Visit    CKD stage 3 due to type 2 diabetes mellitus ([place])    Update renal panel.      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 Increase water intake. Update A1c.       Relevant Orders    Hemoglobin A1c   Essential hypertension, benign - Primary    Chronic, sudden worsening over the last few days, unknown reason. Will encouraged increased water intake to optimize hydration status and continue to monitor blood pressures. If staying elevated, provided with WASP for toprol XL  to fill given elevated HR and PVCs noted today. I asked him to update me in 1 week with results of above plan. Pt agrees with plan.  EKG today.       Relevant Medications   metoprolol succinate (TOPROL-XL) 25 MG 24 hr tablet   Other Relevant Orders   EKG 12-Lead (Completed)   Renal function panel   LAFB (left anterior fascicular block)    Discussed EKG. New fascicular block with frequent PVCs. Will ensure good hydration status. WASP provided for beta blocker if needed. Some dizziness, but pt denies palpitations, headache, dyspnea. Reassess EKG at f/u visit.       Relevant Medications   metoprolol succinate (TOPROL-XL) 25 MG 24 hr tablet       Meds ordered this encounter  Medications  . metoprolol succinate (TOPROL-XL) 25 MG 24 hr tablet    Sig: Take 0.5 tablets (12.5 mg total) by mouth daily.    Dispense:  15 tablet    Refill:  3   Orders Placed This Encounter  Procedures  . Hemoglobin A1c  . Renal function panel  . EKG 12-Lead    Follow up plan: Return if symptoms worsen or fail to improve.  Ria Bush, MD

## 2018-01-11 ENCOUNTER — Telehealth: Payer: Self-pay | Admitting: Internal Medicine

## 2018-01-11 NOTE — Telephone Encounter (Signed)
Pt declined cpap therapy last office visit 08/29/2017. Pt needs another appt before new orders can be placed. LMTCB

## 2018-01-11 NOTE — Telephone Encounter (Signed)
Pt calling asking for the number where he can pick up his CPAP machine He states he did a study late last year and never picked up the equipment   Please advise

## 2018-01-11 NOTE — Telephone Encounter (Signed)
Pt scheduled for 02/05/18 with Dr Mortimer Fries

## 2018-01-18 ENCOUNTER — Other Ambulatory Visit: Payer: Self-pay | Admitting: Family Medicine

## 2018-02-05 ENCOUNTER — Encounter: Payer: Self-pay | Admitting: Internal Medicine

## 2018-02-05 ENCOUNTER — Ambulatory Visit (INDEPENDENT_AMBULATORY_CARE_PROVIDER_SITE_OTHER): Payer: 59 | Admitting: Internal Medicine

## 2018-02-05 VITALS — BP 128/72 | HR 73 | Resp 16 | Ht 73.0 in

## 2018-02-05 DIAGNOSIS — G4733 Obstructive sleep apnea (adult) (pediatric): Secondary | ICD-10-CM | POA: Diagnosis not present

## 2018-02-05 NOTE — Addendum Note (Signed)
Addended by: Oscar La R on: 02/05/2018 09:11 AM   Modules accepted: Orders

## 2018-02-05 NOTE — Patient Instructions (Signed)
Please set up auto-CPAP titration study 5-15cm h20

## 2018-02-05 NOTE — Progress Notes (Signed)
   Name: [name] MRN: [id number] DOB: [birth year]     CONSULTATION DATE: 03/21/17   REFERRING MD : Danise Mina  CHIEF COMPLAINT: Fatigue and excessive daytime sleepiness  STUDIES:  Chest x-ray 01/20/12 reviewed on 03/21/17 Interpretation no acute infiltrates no acute opacifications no evidence of pneumonia   HISTORY OF PRESENT ILLNESS:   Follow up sleep study-results explained to patient AHI 8 Has episodic desats at night Patient refuses to comply with recommendations and he hated the nasal mask and full face mask He wants to try and therapy again  He is still having issues with sleep Wakes up 3-4 times per night, has excessive fatigue during the day   No signs of infection at this time  no signs of congestive heart failure at this time Patient has a diagnosis of epilepsy    REVIEW OF SYSTEMS:    Review of Systems:  Gen:  Denies  fever, sweats, chills weigh loss +fatigue, excessive daytime sleepiness HEENT: Denies blurred vision, double vision, ear pain, eye pain, hearing loss, nose bleeds, sore throat Cardiac:  No dizziness, chest pain or heaviness, chest tightness,edema Resp:  -cough -sputum production, -shortness of breath,-wheezing, -hemoptysis,  Gi: Denies swallowing difficulty, stomach pain, nausea or vomiting, diarrhea, constipation, bowel incontinence Gu:  Denies bladder incontinence, burning urine Ext:   Denies Joint pain, stiffness or swelling Skin: Denies  skin rash, easy bruising or bleeding or hives Endoc:  Denies polyuria, polydipsia , polyphagia or weight change Psych:   Denies depression, insomnia or hallucinations  Other:  All other systems negative  ALL OTHER ROS ARE NEGATIVE   BP 128/72 (BP Location: Left Arm, Cuff Size: Large)   Pulse 73   Resp 16   Ht 6\' 1"  (1.854 m)   SpO2 94%   BMI 33.18 kg/m    Physical Examination:   GENERAL:NAD, no fevers, chills, no weakness no fatigue EAR, NOSE, THROAT: Clear without exudates. No external  lesions.  NECK: Supple. No thyromegaly. No nodules. No JVD.  PULMONARY:CTA B/L no wheezes, no crackles, no rhonchi CARDIOVASCULAR: S1 and S2. Regular rate and rhythm. No murmurs, rubs, or gallops. No edema.  PSYCHIATRIC: Mood, affect within normal limits. The patient is awake, alert and oriented x 3. Insight, judgment intact.      ASSESSMENT / PLAN: 67 year old pleasant African-American male with multiple medical issues including a history of seizure disorder in the setting of excessive daytime sleepiness with progressive fatigue throughout the day with loud snoring with underlying DX of OSA  Patient has returned and would like to start therapy at this time Will set up for auto-cpap titration study 5-15 cmh20 to start  I have explained the risks of NOT getting therapy(daytiem sleepiness, palpations, HTN and death) He understands the risks if he doesn't use CPAP  patient satisfied with Plan of action and management. All questions answered Follow up 3 months   Nickolette Espinola Patricia Pesa, M.D.  [facility]  Medical Director [place] Director [facility] Cardio-Pulmonary Department

## 2018-03-02 ENCOUNTER — Encounter: Payer: Self-pay | Admitting: Family Medicine

## 2018-03-02 NOTE — Telephone Encounter (Signed)
Per mychart message:  Renel,  Actually when I saw you on 01/05/2018 we checked labs, so we are not yet due for labwork.  The upcoming appointment at the end if the month was a 6 month follow up after your physical.  If dizziness and headache are better, I'd like you to reschedule our next visit until after 04/07/2018 so we can recheck labwork at that time.    Please call patient and if he agrees, reschedule July appt to after 04/07/2018. thank you.

## 2018-03-05 NOTE — Telephone Encounter (Signed)
R/s pt for 04/06/18 at 8:15 AM.  Pt is asking, to clarify, if he needs to come in earlier in that week for labs.

## 2018-03-06 ENCOUNTER — Telehealth: Payer: Self-pay | Admitting: Internal Medicine

## 2018-03-06 NOTE — Telephone Encounter (Signed)
See my original note - I asked to reschedule appt for after 8/17 as we won't be able to check A1c until 3 months from last check (5/17).  If patient wants labs 1 week prior, then schedule labs 8/17 and follow up visit 1 wk after this.

## 2018-03-06 NOTE — Telephone Encounter (Signed)
Per Jeneen Rinks at [place]. Order for CPAP was placed in June. Huey Romans has made multiple attempts to arrange CPAP set up and letter has been mailed to patient.  Patient hasn't responded to any of these attempts, therefore order has been canceled with [employer].  If patient calls [employer] back to schedule, order can be reactivate.  Just FYI for physician. Rhonda J Cobb

## 2018-03-06 NOTE — Telephone Encounter (Signed)
Spoke with pt explaining Dr. Synthia Innocent message and reasoning for rescheduling the appt.  Pt decided to cancel 04/06/18 OV and will decide later when to reschedule.  OV canceled.

## 2018-03-19 ENCOUNTER — Ambulatory Visit: Payer: 59 | Admitting: Family Medicine

## 2018-03-22 ENCOUNTER — Ambulatory Visit (INDEPENDENT_AMBULATORY_CARE_PROVIDER_SITE_OTHER): Payer: 59 | Admitting: Podiatry

## 2018-03-22 ENCOUNTER — Encounter: Payer: Self-pay | Admitting: Podiatry

## 2018-03-22 DIAGNOSIS — M2042 Other hammer toe(s) (acquired), left foot: Secondary | ICD-10-CM | POA: Diagnosis not present

## 2018-03-22 DIAGNOSIS — E119 Type 2 diabetes mellitus without complications: Secondary | ICD-10-CM

## 2018-03-22 DIAGNOSIS — M2041 Other hammer toe(s) (acquired), right foot: Secondary | ICD-10-CM | POA: Diagnosis not present

## 2018-03-22 NOTE — Progress Notes (Signed)
This patient presents to the office with chief complaint of non painful black spot on the bottom of his left foot. and diabetic feet.  This patient  says there  is  no pain and discomfort in their feet.  This patient says there is a painful black spot which he is concerned about having.  He says his family member recently had an amputation..  .  Patient has no history of infection or drainage from both feet. Patient says he is not interested in nail care.  . This patient presents  to the office today for treatment of black spot  and a foot evaluation due to history of  diabetes.  General Appearance  Alert, conversant and in no acute stress.  Vascular  Dorsalis pedis and posterior tibial  pulses are palpable  bilaterally.  Capillary return is within normal limits  bilaterally. Temperature is within normal limits  bilaterally.  Neurologic  Senn-Weinstein monofilament wire test within normal limits  bilaterally. Muscle power within normal limits bilaterally.  Nails Thick disfigured discolored nails with subungual debris  from hallux to fifth toes bilaterally. No evidence of bacterial infection or drainage bilaterally.  Orthopedic  No limitations of motion of motion feet .  No crepitus or effusions noted.  No bony pathology or digital deformities noted.  Skin  normotropic skin with no porokeratosis noted bilaterally.  No signs of infections or ulcers noted.   Dried blister plantar aspect left foot.    Diabetes with no foot complications  Hematoma left foot  IE   A diabetic foot exam was performed and there is no evidence of any vascular or neurologic pathology.  Debride hematoma.  RTC 1 year.   Gardiner Barefoot DPM

## 2018-04-06 ENCOUNTER — Other Ambulatory Visit: Payer: Self-pay | Admitting: Family Medicine

## 2018-04-06 ENCOUNTER — Ambulatory Visit: Payer: 59 | Admitting: Family Medicine

## 2018-04-17 ENCOUNTER — Other Ambulatory Visit: Payer: Self-pay | Admitting: Family Medicine

## 2018-05-02 ENCOUNTER — Other Ambulatory Visit: Payer: Self-pay | Admitting: Family Medicine

## 2018-05-04 ENCOUNTER — Other Ambulatory Visit: Payer: Self-pay | Admitting: Family Medicine

## 2018-05-21 ENCOUNTER — Encounter: Payer: Self-pay | Admitting: Family Medicine

## 2018-05-21 ENCOUNTER — Ambulatory Visit (INDEPENDENT_AMBULATORY_CARE_PROVIDER_SITE_OTHER): Payer: 59 | Admitting: Family Medicine

## 2018-05-21 VITALS — BP 122/62 | HR 86 | Temp 97.8°F | Ht 73.0 in | Wt 250.5 lb

## 2018-05-21 DIAGNOSIS — E1122 Type 2 diabetes mellitus with diabetic chronic kidney disease: Secondary | ICD-10-CM

## 2018-05-21 DIAGNOSIS — I10 Essential (primary) hypertension: Secondary | ICD-10-CM | POA: Diagnosis not present

## 2018-05-21 DIAGNOSIS — E1121 Type 2 diabetes mellitus with diabetic nephropathy: Secondary | ICD-10-CM | POA: Diagnosis not present

## 2018-05-21 DIAGNOSIS — I444 Left anterior fascicular block: Secondary | ICD-10-CM

## 2018-05-21 DIAGNOSIS — N183 Chronic kidney disease, stage 3 unspecified: Secondary | ICD-10-CM

## 2018-05-21 DIAGNOSIS — Z23 Encounter for immunization: Secondary | ICD-10-CM | POA: Diagnosis not present

## 2018-05-21 DIAGNOSIS — G4733 Obstructive sleep apnea (adult) (pediatric): Secondary | ICD-10-CM | POA: Diagnosis not present

## 2018-05-21 LAB — POCT GLYCOSYLATED HEMOGLOBIN (HGB A1C): HEMOGLOBIN A1C: 6.7 % — AB (ref 4.0–5.6)

## 2018-05-21 LAB — RENAL FUNCTION PANEL
ALBUMIN: 4.2 g/dL (ref 3.5–5.2)
BUN: 21 mg/dL (ref 6–23)
CHLORIDE: 105 meq/L (ref 96–112)
CO2: 25 mEq/L (ref 19–32)
Calcium: 9.4 mg/dL (ref 8.4–10.5)
Creatinine, Ser: 1.5 mg/dL (ref 0.40–1.50)
GFR: 60.35 mL/min (ref >60.00)
GLUCOSE: 155 mg/dL — AB (ref 70–99)
Phosphorus: 3.5 mg/dL (ref 2.3–4.6)
Potassium: 4.1 mEq/L (ref 3.5–5.1)
SODIUM: 139 meq/L (ref 135–145)

## 2018-05-21 NOTE — Addendum Note (Signed)
Addended by: Brenton Grills on: 2/82/0813 88:71 AM   Modules accepted: Orders

## 2018-05-21 NOTE — Patient Instructions (Addendum)
EKG today Flu shot today prevnar today Labs today Return in 4 months for physical.

## 2018-05-21 NOTE — Assessment & Plan Note (Signed)
Tried CPAP again - unable to tolerate. Has decided not to continue.

## 2018-05-21 NOTE — Assessment & Plan Note (Signed)
Chronic, stable. Continue current medications.  A1c better controlled today.

## 2018-05-21 NOTE — Assessment & Plan Note (Signed)
Update renal panel today. Anticipate better control with better sugar control.

## 2018-05-21 NOTE — Assessment & Plan Note (Signed)
Chronic, stable. Continue current regimen. Off toprol XL.

## 2018-05-21 NOTE — Assessment & Plan Note (Signed)
Update EKG.

## 2018-05-21 NOTE — Progress Notes (Signed)
BP 122/62 (BP Location: Left Arm, Patient Position: Sitting, Cuff Size: Large)   Pulse 86   Temp 97.8 F (36.6 C) (Oral)   Ht 6\' 1"  (1.854 m)   Wt 250 lb 8 oz (113.6 kg)   SpO2 96%   BMI 33.05 kg/m    CC: DM f/u Subjective:    Patient ID: Kirk Bennett., male    DOB: 1951/07/13, 67 y.o.   MRN: [id number]  HPI: Kirk Bennett. is a 67 y.o. male presenting on 05/21/2018 for Follow-up (Also, requesting pneumonia shot.)   OSA - started CPAP this year - trouble tolerating CPAP machine - so has decided not to check.   DM - does regularly check sugars about twice a week - last week 125 fasting. Compliant with antihyperglycemic regimen which includes: farxiga, amaryl, metformin. Denies low sugars or hypoglycemic symptoms. Denies paresthesias. Last diabetic eye exam DUE. Pneumovax: 2013. Prevnar: DUE. Glucometer brand: one-touch. DSME: completed per patient. Lab Results  Component Value Date   HGBA1C 6.7 (A) 05/21/2018   Diabetic Foot Exam - Simple   Simple Foot Form Diabetic Foot exam was performed with the following findings:  Yes 05/21/2018  8:12 AM  Visual Inspection No deformities, no ulcerations, no other skin breakdown bilaterally:  Yes Sensation Testing Intact to touch and monofilament testing bilaterally:  Yes Pulse Check Posterior Tibialis and Dorsalis pulse intact bilaterally:  Yes Comments    Lab Results  Component Value Date   MICROALBUR <0.2 09/11/2015     Relevant past medical, surgical, family and social history reviewed and updated as indicated. Interim medical history since our last visit reviewed. Allergies and medications reviewed and updated. Outpatient Medications Prior to Visit  Medication Sig Dispense Refill  . atorvastatin (LIPITOR) 20 MG tablet TAKE 1 TABLET (20 MG TOTAL) BY MOUTH DAILY. 90 tablet 2  . b complex vitamins tablet Take 1 tablet by mouth every other day.    . Cholecalciferol (VITAMIN D3) 1000 units CAPS Take 1,000 Units by mouth  daily.    Marland Kitchen FARXIGA 10 MG TABS tablet TAKE 1 TABLET (10 MG TOTAL) BY MOUTH EVERY MORNING. 90 tablet 1  . fluticasone (FLONASE) 50 MCG/ACT nasal spray Place 2 sprays into both nostrils daily. 16 g 1  . glimepiride (AMARYL) 2 MG tablet TAKE 1 TABLET BY MOUTH EVERY MORNING WITH BREAKFAST 90 tablet 0  . glucose blood (ONE TOUCH ULTRA TEST) test strip Use to check sugar fasting in the AM and 2 hours after lunch or supper. Dx: E11.21 100 each 3  . levETIRAcetam (KEPPRA) 500 MG tablet Take 500 mg by mouth as directed. 2 tab AM, 3 tab PM    . lisinopril (PRINIVIL,ZESTRIL) 10 MG tablet TAKE 1 TABLET (10 MG TOTAL) BY MOUTH 2 (TWO) TIMES DAILY. 180 tablet 2  . metFORMIN (GLUCOPHAGE) 1000 MG tablet TAKE 1 TABLET BY MOUTH TWICE A DAY WITH A MEAL 180 tablet 1  . tamsulosin (FLOMAX) 0.4 MG CAPS capsule TAKE 1 CAPSULE (0.4 MG TOTAL) BY MOUTH DAILY. 90 capsule 1  . metoprolol succinate (TOPROL-XL) 25 MG 24 hr tablet TAKE 0.5 TABLET BY MOUTH DAILY 45 tablet 0   Facility-Administered Medications Prior to Visit  Medication Dose Route Frequency Provider Last Rate Last Dose  . triamcinolone acetonide (KENALOG) 10 MG/ML injection 10 mg  10 mg Other Once Landis Martins, DPM         Per HPI unless specifically indicated in ROS section below Review of Systems  Objective:    BP 122/62 (BP Location: Left Arm, Patient Position: Sitting, Cuff Size: Large)   Pulse 86   Temp 97.8 F (36.6 C) (Oral)   Ht 6\' 1"  (1.854 m)   Wt 250 lb 8 oz (113.6 kg)   SpO2 96%   BMI 33.05 kg/m   Wt Readings from Last 3 Encounters:  05/21/18 250 lb 8 oz (113.6 kg)  01/05/18 251 lb 8 oz (114.1 kg)  11/03/17 251 lb (113.9 kg)    Physical Exam  Constitutional: He appears well-developed and well-nourished. No distress.  HENT:  Head: Normocephalic and atraumatic.  Right Ear: External ear normal.  Left Ear: External ear normal.  Nose: Nose normal.  Mouth/Throat: Oropharynx is clear and moist. No oropharyngeal exudate.  Eyes:  Pupils are equal, round, and reactive to light. Conjunctivae and EOM are normal. No scleral icterus.  Neck: Normal range of motion. Neck supple.  Cardiovascular: Normal rate, regular rhythm, normal heart sounds and intact distal pulses.  No murmur heard. Pulmonary/Chest: Effort normal and breath sounds normal. No respiratory distress. He has no wheezes. He has no rales.  Musculoskeletal: He exhibits no edema.  See HPI for foot exam if done  Lymphadenopathy:    He has no cervical adenopathy.  Skin: Skin is warm and dry. No rash noted.  Psychiatric: He has a normal mood and affect.  Nursing note and vitals reviewed.  Results for orders placed or performed in visit on 05/21/18  POCT glycosylated hemoglobin (Hb A1C)  Result Value Ref Range   Hemoglobin A1C 6.7 (A) 4.0 - 5.6 %   HbA1c POC (<> result, manual entry)     HbA1c, POC (prediabetic range)     HbA1c, POC (controlled diabetic range)        Assessment & Plan:   Problem List Items Addressed This Visit    OSA (obstructive sleep apnea)    Tried CPAP again - unable to tolerate. Has decided not to continue.       LAFB (left anterior fascicular block)    Update EKG.       Essential hypertension, benign    Chronic, stable. Continue current regimen. Off toprol XL.       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 Continue current medications.  A1c better controlled today.       Relevant Orders   POCT glycosylated hemoglobin (Hb A1C) (Completed)   Renal function panel   CKD stage 3 due to type 2 diabetes mellitus ([place])    Update renal panel today. Anticipate better control with better sugar control.           No orders of the defined types were placed in this encounter.  Orders Placed This Encounter  Procedures  . Renal function panel  . POCT glycosylated hemoglobin (Hb A1C)    Follow up plan: Return in about 4 months (around 09/20/2018) for annual exam, prior  fasting for blood work.  Kirk Bush, MD

## 2018-05-21 NOTE — Addendum Note (Signed)
Addended by: Brenton Grills on: 04/15/36 04:88 AM   Modules accepted: Orders

## 2018-05-24 NOTE — Telephone Encounter (Signed)
Physician aware. Rhonda J Cobb ° °

## 2018-05-25 ENCOUNTER — Encounter: Payer: Self-pay | Admitting: Family Medicine

## 2018-06-09 ENCOUNTER — Other Ambulatory Visit: Payer: Self-pay | Admitting: Family Medicine

## 2018-06-29 ENCOUNTER — Telehealth: Payer: Self-pay | Admitting: Internal Medicine

## 2018-06-29 NOTE — Telephone Encounter (Signed)
Patient declined to schedule .  Per request deleting not needed recall.

## 2018-07-10 ENCOUNTER — Other Ambulatory Visit: Payer: Self-pay | Admitting: Family Medicine

## 2018-07-12 ENCOUNTER — Other Ambulatory Visit: Payer: Self-pay | Admitting: Family Medicine

## 2018-07-27 ENCOUNTER — Other Ambulatory Visit: Payer: Self-pay | Admitting: Family Medicine

## 2018-09-17 ENCOUNTER — Other Ambulatory Visit: Payer: Self-pay | Admitting: Family Medicine

## 2018-09-17 DIAGNOSIS — R972 Elevated prostate specific antigen [PSA]: Secondary | ICD-10-CM

## 2018-09-17 DIAGNOSIS — E559 Vitamin D deficiency, unspecified: Secondary | ICD-10-CM

## 2018-09-17 DIAGNOSIS — E785 Hyperlipidemia, unspecified: Secondary | ICD-10-CM

## 2018-09-17 DIAGNOSIS — E538 Deficiency of other specified B group vitamins: Secondary | ICD-10-CM

## 2018-09-17 DIAGNOSIS — N183 Chronic kidney disease, stage 3 unspecified: Secondary | ICD-10-CM

## 2018-09-17 DIAGNOSIS — E1121 Type 2 diabetes mellitus with diabetic nephropathy: Secondary | ICD-10-CM

## 2018-09-17 DIAGNOSIS — R7989 Other specified abnormal findings of blood chemistry: Secondary | ICD-10-CM

## 2018-09-17 DIAGNOSIS — E1122 Type 2 diabetes mellitus with diabetic chronic kidney disease: Secondary | ICD-10-CM

## 2018-09-18 ENCOUNTER — Other Ambulatory Visit (INDEPENDENT_AMBULATORY_CARE_PROVIDER_SITE_OTHER): Payer: 59

## 2018-09-18 DIAGNOSIS — E785 Hyperlipidemia, unspecified: Secondary | ICD-10-CM | POA: Diagnosis not present

## 2018-09-18 DIAGNOSIS — N183 Chronic kidney disease, stage 3 unspecified: Secondary | ICD-10-CM

## 2018-09-18 DIAGNOSIS — R972 Elevated prostate specific antigen [PSA]: Secondary | ICD-10-CM

## 2018-09-18 DIAGNOSIS — E559 Vitamin D deficiency, unspecified: Secondary | ICD-10-CM

## 2018-09-18 DIAGNOSIS — E1121 Type 2 diabetes mellitus with diabetic nephropathy: Secondary | ICD-10-CM

## 2018-09-18 DIAGNOSIS — E538 Deficiency of other specified B group vitamins: Secondary | ICD-10-CM

## 2018-09-18 DIAGNOSIS — E1122 Type 2 diabetes mellitus with diabetic chronic kidney disease: Secondary | ICD-10-CM

## 2018-09-18 NOTE — Addendum Note (Signed)
Addended by: Ellamae Sia on: 09/18/2018 07:48 AM   Modules accepted: Orders

## 2018-09-18 NOTE — Addendum Note (Signed)
Addended by: Ellamae Sia on: 09/18/2018 07:49 AM   Modules accepted: Orders

## 2018-09-19 LAB — CBC WITH DIFFERENTIAL/PLATELET
Absolute Monocytes: 508 cells/uL (ref 200–950)
Basophils Absolute: 38 cells/uL (ref 0–200)
Basophils Relative: 0.7 %
Eosinophils Absolute: 367 cells/uL (ref 15–500)
Eosinophils Relative: 6.8 %
HCT: 44.3 % (ref 38.5–50.0)
HEMOGLOBIN: 14.9 g/dL (ref 13.2–17.1)
Lymphs Abs: 2117 cells/uL (ref 850–3900)
MCH: 28.2 pg (ref 27.0–33.0)
MCHC: 33.6 g/dL (ref 32.0–36.0)
MCV: 83.7 fL (ref 80.0–100.0)
MPV: 12.2 fL (ref 7.5–12.5)
Monocytes Relative: 9.4 %
NEUTROS ABS: 2371 {cells}/uL (ref 1500–7800)
Neutrophils Relative %: 43.9 %
Platelets: 189 10*3/uL (ref 140–400)
RBC: 5.29 10*6/uL (ref 4.20–5.80)
RDW: 12 % (ref 11.0–15.0)
Total Lymphocyte: 39.2 %
WBC: 5.4 10*3/uL (ref 3.8–10.8)

## 2018-09-19 LAB — HEMOGLOBIN A1C
EAG (MMOL/L): 7.7 (calc)
Hgb A1c MFr Bld: 6.5 % of total Hgb — ABNORMAL HIGH (ref <5.7)
Mean Plasma Glucose: 140 (calc)

## 2018-09-19 LAB — LIPID PANEL
CHOL/HDL RATIO: 3.6 (calc) (ref <5.0)
Cholesterol: 126 mg/dL (ref <200)
HDL: 35 mg/dL — ABNORMAL LOW (ref >40)
LDL Cholesterol (Calc): 76 mg/dL (calc)
Non-HDL Cholesterol (Calc): 91 mg/dL (calc) (ref <130)
Triglycerides: 70 mg/dL (ref <150)

## 2018-09-19 LAB — COMPREHENSIVE METABOLIC PANEL
AG Ratio: 1.6 (calc) (ref 1.0–2.5)
ALKALINE PHOSPHATASE (APISO): 106 U/L (ref 40–115)
ALT: 11 U/L (ref 9–46)
AST: 9 U/L — ABNORMAL LOW (ref 10–35)
Albumin: 4.1 g/dL (ref 3.6–5.1)
BUN/Creatinine Ratio: 12 (calc) (ref 6–22)
BUN: 21 mg/dL (ref 7–25)
CO2: 26 mmol/L (ref 20–32)
Calcium: 9.5 mg/dL (ref 8.6–10.3)
Chloride: 107 mmol/L (ref 98–110)
Creat: 1.69 mg/dL — ABNORMAL HIGH (ref 0.70–1.25)
Globulin: 2.5 g/dL (calc) (ref 1.9–3.7)
Glucose, Bld: 164 mg/dL — ABNORMAL HIGH (ref 65–99)
Potassium: 4.3 mmol/L (ref 3.5–5.3)
Sodium: 142 mmol/L (ref 135–146)
Total Bilirubin: 0.7 mg/dL (ref 0.2–1.2)
Total Protein: 6.6 g/dL (ref 6.1–8.1)

## 2018-09-19 LAB — PSA: PSA: 2 ng/mL (ref <=4.0)

## 2018-09-19 LAB — VITAMIN B12: Vitamin B-12: 564 pg/mL (ref 200–1100)

## 2018-09-19 LAB — VITAMIN D 25 HYDROXY (VIT D DEFICIENCY, FRACTURES): Vit D, 25-Hydroxy: 33 ng/mL (ref 30–100)

## 2018-09-20 ENCOUNTER — Encounter: Payer: Self-pay | Admitting: Family Medicine

## 2018-09-20 NOTE — Progress Notes (Signed)
BP 122/80 (BP Location: Left Arm, Patient Position: Sitting, Cuff Size: Large)   Pulse 88   Temp 97.6 F (36.4 C) (Oral)   Ht 6\' 1"  (1.854 m)   Wt 248 lb (112.5 kg)   SpO2 97%   BMI 32.72 kg/m    CC: CPE Subjective:    Patient ID: Kirk Bennett., male    DOB: 1951-05-11, 68 y.o.   MRN: [id number]  HPI: Kirk Bennett. is a 68 y.o. male presenting on 09/21/2018 for Annual Exam   Has Medicare part A.  Noticing bilateral leg weakness over the past 6 months - has stopped walking regularly due to this.   Preventative: COLONOSCOPY WITH PROPOFOL 12/11/2015 mult polyps, few TA, rpt 66yrs Gustavo Lah) Prostate cancer screening -PSA increased to 4.4 last year, referred to urology with stable DRE and decreased PSA to 2s. Rec yearly monitoring at PCP's office.  Flu shot yearly Tdap -04/2011 Pneumovax 2013, prevnar 04/2018 Shingrix - discussed  Had hepatitis at age 56yo (?A), hospitalized [facility] [place].  No hep B in past.  Advanced directive: has this at home. Wife is HCPOA. Asked to bring [place] copy Seat belt use discussed. Sunscreen use discussed.No suspicious moles on skin. Non smoker Alcohol - none Dentist q6 mo  Eye exam yearly  Lives with wife and granddaughter, 1 cat Occupation: retired, worked Oncologist at school Activity: no regular exercise due to leg weakness Diet: good water, good vegetables, follows diabetic diet      Relevant past medical, surgical, family and social history reviewed and updated as indicated. Interim medical history since our last visit reviewed. Allergies and medications reviewed and updated. Outpatient Medications Prior to Visit  Medication Sig Dispense Refill  . Cholecalciferol (VITAMIN D3) 1000 units CAPS Take 1,000 Units by mouth daily.    Marland Kitchen glucose blood (ONE TOUCH ULTRA TEST) test strip Use to check sugar fasting in the AM and 2 hours after lunch or supper. Dx: E11.21 100 each 3  . levETIRAcetam (KEPPRA) 500 MG  tablet Take 500 mg by mouth as directed. 2 tab AM, 3 tab PM    . atorvastatin (LIPITOR) 20 MG tablet TAKE 1 TABLET (20 MG TOTAL) BY MOUTH DAILY. 90 tablet 2  . FARXIGA 10 MG TABS tablet TAKE 1 TABLET (10 MG TOTAL) BY MOUTH EVERY MORNING. 90 tablet 1  . glimepiride (AMARYL) 2 MG tablet TAKE 1 TABLET BY MOUTH EVERY MORNING WITH BREAKFAST 90 tablet 1  . lisinopril (PRINIVIL,ZESTRIL) 10 MG tablet TAKE 1 TABLET (10 MG TOTAL) BY MOUTH 2 (TWO) TIMES DAILY. 180 tablet 2  . metFORMIN (GLUCOPHAGE) 1000 MG tablet TAKE 1 TABLET BY MOUTH TWICE A DAY WITH A MEAL 180 tablet 1  . tamsulosin (FLOMAX) 0.4 MG CAPS capsule TAKE 1 CAPSULE (0.4 MG TOTAL) BY MOUTH DAILY. 90 capsule 1  . b complex vitamins tablet Take 1 tablet by mouth every other day.    . fluticasone (FLONASE) 50 MCG/ACT nasal spray Place 2 sprays into both nostrils daily. 16 g 1   Facility-Administered Medications Prior to Visit  Medication Dose Route Frequency Provider Last Rate Last Dose  . triamcinolone acetonide (KENALOG) 10 MG/ML injection 10 mg  10 mg Other Once Landis Martins, DPM         Per HPI unless specifically indicated in ROS section below Review of Systems  Constitutional: Negative for activity change, appetite change, chills, fatigue, fever and unexpected weight change.  HENT: Negative for hearing loss.  Eyes: Negative for visual disturbance.  Respiratory: Negative for cough, chest tightness, shortness of breath and wheezing.   Cardiovascular: Negative for chest pain, palpitations and leg swelling.  Gastrointestinal: Negative for abdominal distention, abdominal pain, blood in stool, constipation, diarrhea, nausea and vomiting.  Genitourinary: Negative for difficulty urinating and hematuria.  Musculoskeletal: Negative for arthralgias, myalgias and neck pain.  Skin: Negative for rash.  Neurological: Positive for dizziness. Negative for seizures (none in the past 1.5 yrs), syncope and headaches.  Hematological: Negative for  adenopathy. Does not bruise/bleed easily.  Psychiatric/Behavioral: Negative for dysphoric mood. The patient is not nervous/anxious.    Objective:    BP 122/80 (BP Location: Left Arm, Patient Position: Sitting, Cuff Size: Large)   Pulse 88   Temp 97.6 F (36.4 C) (Oral)   Ht 6\' 1"  (1.854 m)   Wt 248 lb (112.5 kg)   SpO2 97%   BMI 32.72 kg/m   Wt Readings from Last 3 Encounters:  09/21/18 248 lb (112.5 kg)  05/21/18 250 lb 8 oz (113.6 kg)  01/05/18 251 lb 8 oz (114.1 kg)    Physical Exam Vitals signs and nursing note reviewed.  Constitutional:      General: He is not in acute distress.    Appearance: Normal appearance. He is well-developed.  HENT:     Head: Normocephalic and atraumatic.     Right Ear: Hearing, tympanic membrane, ear canal and external ear normal.     Left Ear: Hearing, tympanic membrane, ear canal and external ear normal.     Nose: Nose normal.     Mouth/Throat:     Mouth: Mucous membranes are moist.     Pharynx: Uvula midline. No oropharyngeal exudate or posterior oropharyngeal erythema.  Eyes:     General: No scleral icterus.    Conjunctiva/sclera: Conjunctivae normal.     Pupils: Pupils are equal, round, and reactive to light.  Neck:     Musculoskeletal: Normal range of motion and neck supple.  Cardiovascular:     Rate and Rhythm: Normal rate and regular rhythm.     Pulses: Normal pulses.          Radial pulses are 2+ on the right side and 2+ on the left side.     Heart sounds: Normal heart sounds. No murmur.  Pulmonary:     Effort: Pulmonary effort is normal. No respiratory distress.     Breath sounds: Normal breath sounds. No wheezing, rhonchi or rales.  Abdominal:     General: Bowel sounds are normal. There is no distension.     Palpations: Abdomen is soft. There is no mass.     Tenderness: There is no abdominal tenderness. There is no guarding or rebound.  Musculoskeletal: Normal range of motion.  Lymphadenopathy:     Cervical: No cervical  adenopathy.  Skin:    General: Skin is warm and dry.     Findings: No rash.  Neurological:     Mental Status: He is alert and oriented to person, place, and time.     Sensory: Sensation is intact.     Motor: Motor function is intact.     Coordination: Coordination is intact.     Comments: CN grossly intact, station and gait intact 5/5 strength BLE including hip abductors/adductors  Psychiatric:        Mood and Affect: Mood normal.        Behavior: Behavior normal.        Thought Content: Thought content normal.  Judgment: Judgment normal.       Results for orders placed or performed in visit on 09/18/18  Lipid panel  Result Value Ref Range   Cholesterol 126 <200 mg/dL   HDL 35 (L) >40 mg/dL   Triglycerides 70 <150 mg/dL   LDL Cholesterol (Calc) 76 mg/dL (calc)   Total CHOL/HDL Ratio 3.6 <5.0 (calc)   Non-HDL Cholesterol (Calc) 91 <130 mg/dL (calc)  Comprehensive metabolic panel  Result Value Ref Range   Glucose, Bld 164 (H) 65 - 99 mg/dL   BUN 21 7 - 25 mg/dL   Creat 1.69 (H) 0.70 - 1.25 mg/dL   BUN/Creatinine Ratio 12 6 - 22 (calc)   Sodium 142 135 - 146 mmol/L   Potassium 4.3 3.5 - 5.3 mmol/L   Chloride 107 98 - 110 mmol/L   CO2 26 20 - 32 mmol/L   Calcium 9.5 8.6 - 10.3 mg/dL   Total Protein 6.6 6.1 - 8.1 g/dL   Albumin 4.1 3.6 - 5.1 g/dL   Globulin 2.5 1.9 - 3.7 g/dL (calc)   AG Ratio 1.6 1.0 - 2.5 (calc)   Total Bilirubin 0.7 0.2 - 1.2 mg/dL   Alkaline phosphatase (APISO) 106 40 - 115 U/L   AST 9 (L) 10 - 35 U/L   ALT 11 9 - 46 U/L  Hemoglobin A1c  Result Value Ref Range   Hgb A1c MFr Bld 6.5 (H) <5.7 % of total Hgb   Mean Plasma Glucose 140 (calc)   eAG (mmol/L) 7.7 (calc)  PSA  Result Value Ref Range   PSA 2.0 < OR = 4.0 ng/mL  CBC with Differential/Platelet  Result Value Ref Range   WBC 5.4 3.8 - 10.8 Thousand/uL   RBC 5.29 4.20 - 5.80 Million/uL   Hemoglobin 14.9 13.2 - 17.1 g/dL   HCT 44.3 38.5 - 50.0 %   MCV 83.7 80.0 - 100.0 fL   MCH  28.2 27.0 - 33.0 pg   MCHC 33.6 32.0 - 36.0 g/dL   RDW 12.0 11.0 - 15.0 %   Platelets 189 140 - 400 Thousand/uL   MPV 12.2 7.5 - 12.5 fL   Neutro Abs 2,371 1,500 - 7,800 cells/uL   Lymphs Abs 2,117 850 - 3,900 cells/uL   Absolute Monocytes 508 200 - 950 cells/uL   Eosinophils Absolute 367 15 - 500 cells/uL   Basophils Absolute 38 0 - 200 cells/uL   Neutrophils Relative % 43.9 %   Total Lymphocyte 39.2 %   Monocytes Relative 9.4 %   Eosinophils Relative 6.8 %   Basophils Relative 0.7 %  VITAMIN D 25 Hydroxy (Vit-D Deficiency, Fractures)  Result Value Ref Range   Vit D, 25-Hydroxy 33 30 - 100 ng/mL  Vitamin B12  Result Value Ref Range   Vitamin B-12 564 200 - 1,100 pg/mL   Assessment & Plan:   Problem List Items Addressed This Visit    Weakness of both lower extremities    Notes leg weakness affecting ability to exercise. Benign neurological exam. Doubt keppra related (weakness is not listed as side effect). Will check further labs at next visit.       Relevant Orders   CK   TSH   Vitamin D deficiency    Continue 1000 IU daily.       Partial epilepsy with impairment of consciousness [facility])    Appreciate neuro care. He is interested in decreasing keppra - encouraged neuro f/u and discussion with neuro prior to any med changes. Will need  keppra levels next labs.       Relevant Orders   Levetiracetam level   Other long term (current) drug therapy   Relevant Orders   Levetiracetam level   OSA (obstructive sleep apnea)    Saw pulm ([place]), unable to tolerate CPAP so not using this.       Obesity, Class I, BMI 30.0-34.9 (see actual BMI)    Encouraged healthy diet and lifestyle choices to affect sustainable weight loss. Discussed stationary bicycle.       NAFLD (nonalcoholic fatty liver disease)    LFTs stable. Continue to monitor.       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       Hyperlipidemia    Chronic, stable on lipitor 20mg  daily. Continue.  The ASCVD Risk score Mikey Bussing DC Jr., et al., 2013) failed to calculate for the following reasons:   The valid total cholesterol range is 130 to 320 mg/dL       Relevant Medications   lisinopril (PRINIVIL,ZESTRIL) 10 MG tablet   atorvastatin (LIPITOR) 20 MG tablet   History of hepatitis    Check acute hep panel next visit. Consider hep B shots in diabetic with NAFLD      Health maintenance examination - Primary    Preventative protocols reviewed and updated unless pt declined. Discussed healthy diet and lifestyle.       Essential hypertension, benign    Chronic, stable. Continue current regimen       Relevant Medications   lisinopril (PRINIVIL,ZESTRIL) 10 MG tablet   atorvastatin (LIPITOR) 20 MG tablet   Elevated PSA    Levels remain normal. Saw urology, released back to PCP care.  Continue flomax.        Controlled type 2 diabetes mellitus with diabetic nephropathy (HCC)    Chronic, stable. Continue current regimen.       Relevant Medications   dapagliflozin propanediol (FARXIGA) 10 MG TABS tablet   glimepiride (AMARYL) 2 MG tablet   lisinopril (PRINIVIL,ZESTRIL) 10 MG tablet   metFORMIN (GLUCOPHAGE) 1000 MG tablet   atorvastatin (LIPITOR) 20 MG tablet   Other Relevant Orders   Hemoglobin A1c   CKD stage 3 due to type 2 diabetes mellitus (HCC)    Chronic, remains impaired but stable.       Relevant Medications   dapagliflozin propanediol (FARXIGA) 10 MG TABS tablet   glimepiride (AMARYL) 2 MG tablet   lisinopril (PRINIVIL,ZESTRIL) 10 MG tablet   metFORMIN (GLUCOPHAGE) 1000 MG tablet   atorvastatin (LIPITOR) 20 MG tablet   Other Relevant Orders   Renal function panel   Advanced care planning/counseling discussion    Advanced directive: has this at home. Wife is HCPOA. Asked to bring [place] copy          Meds ordered this encounter  Medications   . dapagliflozin propanediol (FARXIGA) 10 MG TABS tablet    Sig: TAKE 1 TABLET (10 MG TOTAL) BY MOUTH EVERY MORNING.    Dispense:  90 tablet    Refill:  3  . glimepiride (AMARYL) 2 MG tablet    Sig: Take 1 tablet (2 mg total) by mouth daily with breakfast.    Dispense:  90 tablet    Refill:  3  . lisinopril (PRINIVIL,ZESTRIL) 10 MG tablet    Sig: Take 1 tablet (10 mg total) by  mouth 2 (two) times daily.    Dispense:  180 tablet    Refill:  3  . metFORMIN (GLUCOPHAGE) 1000 MG tablet    Sig: TAKE 1 TABLET BY MOUTH TWICE A DAY WITH A MEAL    Dispense:  180 tablet    Refill:  3  . tamsulosin (FLOMAX) 0.4 MG CAPS capsule    Sig: Take 1 capsule (0.4 mg total) by mouth daily.    Dispense:  90 capsule    Refill:  3  . atorvastatin (LIPITOR) 20 MG tablet    Sig: Take 1 tablet (20 mg total) by mouth daily.    Dispense:  90 tablet    Refill:  3   Orders Placed This Encounter  Procedures  . Renal function panel    Standing Status:   Future    Standing Expiration Date:   09/23/2019  . Hemoglobin A1c    Standing Status:   Future    Standing Expiration Date:   09/23/2019  . CK    Standing Status:   Future    Standing Expiration Date:   09/23/2019  . TSH    Standing Status:   Future    Standing Expiration Date:   09/23/2019  . Levetiracetam level    Standing Status:   Future    Standing Expiration Date:   09/23/2019    Patient instructions: We will contact you about shingles shot through our office. Bring [place] copy of your advanced directives to update your chart. We will check keppra levels next visit.  Try non-weight bearing exercise for aerobics.  Good to see you today Return in 4 months for follow up visit.   Follow up plan: Return in about 4 months (around 01/20/2019) for follow up visit.  Ria Bush, MD

## 2018-09-21 ENCOUNTER — Encounter: Payer: Self-pay | Admitting: Family Medicine

## 2018-09-21 ENCOUNTER — Ambulatory Visit (INDEPENDENT_AMBULATORY_CARE_PROVIDER_SITE_OTHER): Payer: 59 | Admitting: Family Medicine

## 2018-09-21 VITALS — BP 122/80 | HR 88 | Temp 97.6°F | Ht 73.0 in | Wt 248.0 lb

## 2018-09-21 DIAGNOSIS — E1122 Type 2 diabetes mellitus with diabetic chronic kidney disease: Secondary | ICD-10-CM

## 2018-09-21 DIAGNOSIS — G4733 Obstructive sleep apnea (adult) (pediatric): Secondary | ICD-10-CM

## 2018-09-21 DIAGNOSIS — Z0000 Encounter for general adult medical examination without abnormal findings: Secondary | ICD-10-CM

## 2018-09-21 DIAGNOSIS — Z79899 Other long term (current) drug therapy: Secondary | ICD-10-CM

## 2018-09-21 DIAGNOSIS — R29898 Other symptoms and signs involving the musculoskeletal system: Secondary | ICD-10-CM

## 2018-09-21 DIAGNOSIS — N183 Chronic kidney disease, stage 3 (moderate): Secondary | ICD-10-CM

## 2018-09-21 DIAGNOSIS — E559 Vitamin D deficiency, unspecified: Secondary | ICD-10-CM

## 2018-09-21 DIAGNOSIS — G40109 Localization-related (focal) (partial) symptomatic epilepsy and epileptic syndromes with simple partial seizures, not intractable, without status epilepticus: Secondary | ICD-10-CM

## 2018-09-21 DIAGNOSIS — Z8619 Personal history of other infectious and parasitic diseases: Secondary | ICD-10-CM

## 2018-09-21 DIAGNOSIS — G40209 Localization-related (focal) (partial) symptomatic epilepsy and epileptic syndromes with complex partial seizures, not intractable, without status epilepticus: Secondary | ICD-10-CM | POA: Diagnosis not present

## 2018-09-21 DIAGNOSIS — Z7189 Other specified counseling: Secondary | ICD-10-CM | POA: Diagnosis not present

## 2018-09-21 DIAGNOSIS — E538 Deficiency of other specified B group vitamins: Secondary | ICD-10-CM

## 2018-09-21 DIAGNOSIS — E785 Hyperlipidemia, unspecified: Secondary | ICD-10-CM

## 2018-09-21 DIAGNOSIS — R972 Elevated prostate specific antigen [PSA]: Secondary | ICD-10-CM

## 2018-09-21 DIAGNOSIS — I10 Essential (primary) hypertension: Secondary | ICD-10-CM

## 2018-09-21 DIAGNOSIS — K760 Fatty (change of) liver, not elsewhere classified: Secondary | ICD-10-CM

## 2018-09-21 DIAGNOSIS — E669 Obesity, unspecified: Secondary | ICD-10-CM

## 2018-09-21 DIAGNOSIS — E1121 Type 2 diabetes mellitus with diabetic nephropathy: Secondary | ICD-10-CM

## 2018-09-21 MED ORDER — METFORMIN HCL 1000 MG PO TABS: ORAL_TABLET | ORAL | 3 refills | Status: DC

## 2018-09-21 MED ORDER — LISINOPRIL 10 MG PO TABS: 10.0000 mg | ORAL_TABLET | Freq: Two times a day (BID) | ORAL | 3 refills | Status: DC

## 2018-09-21 MED ORDER — DAPAGLIFLOZIN PROPANEDIOL 10 MG PO TABS: ORAL_TABLET | ORAL | 3 refills | Status: DC

## 2018-09-21 MED ORDER — TAMSULOSIN HCL 0.4 MG PO CAPS: 0.4000 mg | ORAL_CAPSULE | Freq: Every day | ORAL | 3 refills | Status: DC

## 2018-09-21 MED ORDER — GLIMEPIRIDE 2 MG PO TABS: 2.0000 mg | ORAL_TABLET | Freq: Every day | ORAL | 3 refills | Status: DC

## 2018-09-21 MED ORDER — ATORVASTATIN CALCIUM 20 MG PO TABS: 20.0000 mg | ORAL_TABLET | Freq: Every day | ORAL | 3 refills | Status: DC

## 2018-09-21 NOTE — Patient Instructions (Addendum)
We will contact you about shingles shot through our office. Bring [place] copy of your advanced directives to update your chart. We will check keppra levels next visit.  Try non-weight bearing exercise for aerobics.  Good to see you today Return in 4 months for follow up visit.   Health Maintenance After Age 66 After age 51, you are at a higher risk for certain long-term diseases and infections as well as injuries from falls. Falls are a major cause of broken bones and head injuries in people who are older than age 68. Getting regular preventive care can help to keep you healthy and well. Preventive care includes getting regular testing and making lifestyle changes as recommended by your health care provider. Talk with your health care provider about:  Which screenings and tests you should have. A screening is a test that checks for a disease when you have no symptoms.  A diet and exercise plan that is right for you. What should I know about screenings and tests to prevent falls? Screening and testing are the best ways to find a health problem early. Early diagnosis and treatment give you the best chance of managing medical conditions that are common after age 88. Certain conditions and lifestyle choices may make you more likely to have a fall. Your health care provider may recommend:  Regular vision checks. Poor vision and conditions such as cataracts can make you more likely to have a fall. If you wear glasses, make sure to get your prescription updated if your vision changes.  Medicine review. Work with your health care provider to regularly review all of the medicines you are taking, including over-the-counter medicines. Ask your health care provider about any side effects that may make you more likely to have a fall. Tell your health care provider if any medicines that you take make you feel dizzy or sleepy.  Osteoporosis screening. Osteoporosis is a condition that causes the bones to get weaker.  This can make the bones weak and cause them to break more easily.  Blood pressure screening. Blood pressure changes and medicines to control blood pressure can make you feel dizzy.  Strength and balance checks. Your health care provider may recommend certain tests to check your strength and balance while standing, walking, or changing positions.  Foot health exam. Foot pain and numbness, as well as not wearing proper footwear, can make you more likely to have a fall.  Depression screening. You may be more likely to have a fall if you have a fear of falling, feel emotionally low, or feel unable to do activities that you used to do.  Alcohol use screening. Using too much alcohol can affect your balance and may make you more likely to have a fall. What actions can I take to lower my risk of falls? General instructions  Talk with your health care provider about your risks for falling. Tell your health care provider if: ? You fall. Be sure to tell your health care provider about all falls, even ones that seem minor. ? You feel dizzy, sleepy, or off-balance.  Take over-the-counter and prescription medicines only as told by your health care provider. These include any supplements.  Eat a healthy diet and maintain a healthy weight. A healthy diet includes low-fat dairy products, low-fat (lean) meats, and fiber from whole grains, beans, and lots of fruits and vegetables. Home safety  Remove any tripping hazards, such as rugs, cords, and clutter.  Install safety equipment such as grab bars  in bathrooms and safety rails on stairs.  Keep rooms and walkways well-lit. Activity   Follow a regular exercise program to stay fit. This will help you maintain your balance. Ask your health care provider what types of exercise are appropriate for you.  If you need a cane or walker, use it as recommended by your health care provider.  Wear supportive shoes that have nonskid soles. Lifestyle  Do not  drink alcohol if your health care provider tells you not to drink.  If you drink alcohol, limit how much you have: ? 0-1 drink a day for women. ? 0-2 drinks a day for men.  Be aware of how much alcohol is in your drink. In the [place] one drink equals one typical bottle of beer (12 oz), one-half glass of wine (5 oz), or one shot of hard liquor (1 oz).  Do not use any products that contain nicotine or tobacco, such as cigarettes and e-cigarettes. If you need help quitting, ask your health care provider. Summary  Having a healthy lifestyle and getting preventive care can help to protect your health and wellness after age 22.  Screening and testing are the best way to find a health problem early and help you avoid having a fall. Early diagnosis and treatment give you the best chance for managing medical conditions that are more common for people who are older than age 66.  Falls are a major cause of broken bones and head injuries in people who are older than age 69. Take precautions to prevent a fall at home.  Work with your health care provider to learn what changes you can make to improve your health and wellness and to prevent falls. This information is not intended to replace advice given to you by your health care provider. Make sure you discuss any questions you have with your health care provider. Document Released: 06/21/2017 Document Revised: 06/21/2017 Document Reviewed: 06/21/2017 Elsevier Interactive Patient Education  2019 [employer].

## 2018-09-22 DIAGNOSIS — Z7189 Other specified counseling: Secondary | ICD-10-CM | POA: Insufficient documentation

## 2018-09-22 DIAGNOSIS — R29898 Other symptoms and signs involving the musculoskeletal system: Secondary | ICD-10-CM | POA: Insufficient documentation

## 2018-09-22 NOTE — Assessment & Plan Note (Signed)
Continue 1000 IU daily. 

## 2018-09-22 NOTE — Assessment & Plan Note (Signed)
Notes leg weakness affecting ability to exercise. Benign neurological exam. Doubt keppra related (weakness is not listed as side effect). Will check further labs at next visit.

## 2018-09-22 NOTE — Assessment & Plan Note (Signed)
Chronic, stable on lipitor 20mg  daily. Continue.  The ASCVD Risk score Kirk Bussing DC Jr., et al., 2013) failed to calculate for the following reasons:   The valid total cholesterol range is 130 to 320 mg/dL

## 2018-09-22 NOTE — Assessment & Plan Note (Signed)
Continue keppra

## 2018-09-22 NOTE — Assessment & Plan Note (Signed)
LFT's stable.  Continue to monitor.

## 2018-09-22 NOTE — Assessment & Plan Note (Signed)
Appreciate neuro care. He is interested in decreasing keppra - encouraged neuro f/u and discussion with neuro prior to any med changes. Will need keppra levels next labs.

## 2018-09-22 NOTE — Assessment & Plan Note (Addendum)
Encouraged healthy diet and lifestyle choices to affect sustainable weight loss. Discussed stationary bicycle.

## 2018-09-22 NOTE — Assessment & Plan Note (Signed)
Check acute hep panel next visit. Consider hep B shots in diabetic with NAFLD

## 2018-09-22 NOTE — Assessment & Plan Note (Signed)
Chronic, stable. Continue current regimen. 

## 2018-09-22 NOTE — Assessment & Plan Note (Addendum)
Preventative protocols reviewed and updated unless pt declined. Discussed healthy diet and lifestyle.  

## 2018-09-22 NOTE — Assessment & Plan Note (Signed)
Advanced directive: has this at home. Wife is HCPOA. Asked to bring [place] copy

## 2018-09-22 NOTE — Assessment & Plan Note (Addendum)
Levels remain normal. Saw urology, released back to PCP care.  Continue flomax.

## 2018-09-22 NOTE — Assessment & Plan Note (Signed)
Chronic, remains impaired but stable.

## 2018-09-22 NOTE — Assessment & Plan Note (Signed)
b12 levels now repleted - continue b complex QOD

## 2018-09-22 NOTE — Assessment & Plan Note (Addendum)
Saw pulm ([facility]), unable to tolerate CPAP so not using this.

## 2018-10-21 LAB — HM DIABETES EYE EXAM

## 2018-12-06 ENCOUNTER — Encounter: Payer: Self-pay | Admitting: Family Medicine

## 2018-12-17 ENCOUNTER — Ambulatory Visit: Payer: 59 | Admitting: Family Medicine

## 2018-12-17 ENCOUNTER — Other Ambulatory Visit: Payer: Self-pay

## 2018-12-17 ENCOUNTER — Encounter: Payer: Self-pay | Admitting: Family Medicine

## 2018-12-17 VITALS — BP 136/70 | HR 100 | Temp 97.8°F | Ht 73.0 in | Wt 249.4 lb

## 2018-12-17 DIAGNOSIS — M7021 Olecranon bursitis, right elbow: Secondary | ICD-10-CM | POA: Diagnosis not present

## 2018-12-17 NOTE — Patient Instructions (Signed)
Olecranon bursitis aspiration performed today.  Keep compression stocking in place for next 2-3 days if possible to avoid re-accumulation.  If bursitis returns, let us know for referral to orthopedist.   Elbow Bursitis  Bursitis is swelling and pain at the tip of the elbow. This happens when fluid builds up in a sac under the skin (bursa). This may also be called olecranon bursitis. What are the causes? Elbow bursitis may be caused by:  Elbow injury, such as falling onto the elbow.  Leaning on hard surfaces for long periods of time.  Infection from an injury that breaks the skin near the elbow.  A bone growth (spur) that forms at the tip of the elbow.  A medical condition that causes inflammation, such as gout or rheumatoid arthritis. Sometimes the cause is not known. What are the signs or symptoms? The first sign of elbow bursitis is usually swelling at the tip of the elbow. This can grow to be about the size of a golf ball. Swelling may start suddenly or develop gradually. Other symptoms may include:  Pain when bending or leaning on the elbow.  Not being able to move the elbow normally. If bursitis is caused by an infection, you may have:  Redness, warmth, and tenderness of the elbow.  Drainage of pus from the swollen area over the elbow, if the skin breaks open. How is this diagnosed? This condition may be diagnosed based on:  Your symptoms and medical history.  Any recent injuries you have had.  A physical exam.  X-rays to check for a bone spur or fracture.  Draining fluid from the bursa to test it for infection.  Blood tests to rule out gout or rheumatoid arthritis. How is this treated? Treatment for elbow bursitis depends on the cause. Treatment may include:  Medicines. These may include: ? Over-the-counter medicines to relieve pain and inflammation. ? Antibiotic medicines. ? Injections of anti-inflammatory medicines (steroids).  Draining fluid from the  bursa.  Wrapping your elbow with a bandage.  Wearing elbow pads. If these treatments do not help, you may need surgery to remove the bursa. Follow these instructions at home: Medicines  Take over-the-counter and prescription medicines only as told by your health care provider.  If you were prescribed an antibiotic medicine, take it as told by your health care provider. Do not stop taking the antibiotic even if you start to feel better. Managing pain, stiffness, and swelling   If directed, put ice on your elbow: ? Put ice in a plastic bag. ? Place a towel between your skin and the bag. ? Leave the ice on for 20 minutes, 2-3 times a day.  If your bursitis is caused by an injury, rest your elbow and wear your bandage as told by your health care provider.  Use elbow pads or elbow wraps to cushion your elbow as needed. General instructions  Avoid any activities that cause elbow pain. Ask your health care provider what activities are safe for you.  Keep all follow-up visits as told by your health care provider. This is important. Contact a health care provider if you have:  A fever.  Symptoms that do not get better with treatment.  Pain or swelling that: ? Gets worse. ? Goes away and then comes back.  Pus draining from your elbow. Get help right away if you have:  Trouble moving your arm, hand, or fingers. Summary  Elbow bursitis is inflammation of the fluid-filled sac (bursa) between the tip of  your elbow bone (olecranon) and your skin.  Treatment for elbow bursitis depends on the cause. It may include medicines to relieve pain and inflammation, antibiotic medicines, and draining fluid from your elbow.  Contact a health care provider if your symptoms do not get better with treatment, or if your symptoms go away and then come back. This information is not intended to replace advice given to you by your health care provider. Make sure you discuss any questions you have with  your health care provider. Document Released: 09/07/2006 Document Revised: 07/18/2017 Document Reviewed: 07/18/2017 Elsevier Interactive Patient Education  2019 [employer].

## 2018-12-17 NOTE — Progress Notes (Signed)
BP 136/70 (BP Location: Left Arm, Patient Position: Sitting, Cuff Size: Normal)   Pulse 100   Temp 97.8 F (36.6 C) (Oral)   Ht 6\' 1"  (1.854 m)   Wt 249 lb 7 oz (113.1 kg)   SpO2 96%   BMI 32.91 kg/m    CC: fluid in R elbow.  Subjective:    Patient ID: Kirk Bennett., male    DOB: 1951/06/22, 68 y.o.   MRN: [id number]  HPI: Raymond Bhardwaj. is a 68 y.o. male presenting on 12/17/2018 for Joint Swelling (C/o fluid in right elbow and pain. Started last week. )   Pt contacted [place] through mychart with noted fluid accumulation of R elbow on 12/06/2018. Denies inciting trauma. No known h/o gout. No redness, warmth of bursa.   Today noted increasing pain so came in for more definitive treatment.      Relevant past medical, surgical, family and social history reviewed and updated as indicated. Interim medical history since our last visit reviewed. Allergies and medications reviewed and updated. Outpatient Medications Prior to Visit  Medication Sig Dispense Refill  . atorvastatin (LIPITOR) 20 MG tablet Take 1 tablet (20 mg total) by mouth daily. 90 tablet 3  . Cholecalciferol (VITAMIN D3) 1000 units CAPS Take 1,000 Units by mouth daily.    . dapagliflozin propanediol (FARXIGA) 10 MG TABS tablet TAKE 1 TABLET (10 MG TOTAL) BY MOUTH EVERY MORNING. 90 tablet 3  . glimepiride (AMARYL) 2 MG tablet Take 1 tablet (2 mg total) by mouth daily with breakfast. 90 tablet 3  . glucose blood (ONE TOUCH ULTRA TEST) test strip Use to check sugar fasting in the AM and 2 hours after lunch or supper. Dx: E11.21 100 each 3  . levETIRAcetam (KEPPRA) 500 MG tablet Take 500 mg by mouth as directed. 2 tab AM, 3 tab PM    . lisinopril (PRINIVIL,ZESTRIL) 10 MG tablet Take 1 tablet (10 mg total) by mouth 2 (two) times daily. 180 tablet 3  . metFORMIN (GLUCOPHAGE) 1000 MG tablet TAKE 1 TABLET BY MOUTH TWICE A DAY WITH A MEAL 180 tablet 3  . tamsulosin (FLOMAX) 0.4 MG CAPS capsule Take 1 capsule (0.4 mg total) by  mouth daily. 90 capsule 3   Facility-Administered Medications Prior to Visit  Medication Dose Route Frequency Provider Last Rate Last Dose  . triamcinolone acetonide (KENALOG) 10 MG/ML injection 10 mg  10 mg Other Once Landis Martins, DPM         Per HPI unless specifically indicated in ROS section below Review of Systems Objective:    BP 136/70 (BP Location: Left Arm, Patient Position: Sitting, Cuff Size: Normal)   Pulse 100   Temp 97.8 F (36.6 C) (Oral)   Ht 6\' 1"  (1.854 m)   Wt 249 lb 7 oz (113.1 kg)   SpO2 96%   BMI 32.91 kg/m   Wt Readings from Last 3 Encounters:  12/17/18 249 lb 7 oz (113.1 kg)  09/21/18 248 lb (112.5 kg)  05/21/18 250 lb 8 oz (113.6 kg)    Physical Exam Vitals signs and nursing note reviewed.  Constitutional:      Appearance: Normal appearance. He is not ill-appearing.  Musculoskeletal: Normal range of motion.        General: Swelling present.     Comments:  FROM at bilateral elbows L elbow WNL R olecranon bursal swelling without surrounding erythema or warmth. Some tenderness to palpation at olecranon.   Neurological:  Mental Status: He is alert.    R olecranon bursitis aspiration: IC obtained and in chart.  Area prepped with alcohol, anesthesia with ethyl chloride.  1.5 in 22g needle attached to 5cc syringe used to aspirate fluid.  20cc of yellow appearing synovial fluid aspirated, not cloudy.  Fluid sent for crystal analysis. Dressed in compression bandage. Pt tolerated procedure well.      No results found for: LABURIC Assessment & Plan:   Problem List Items Addressed This Visit    Olecranon bursitis of right elbow - Primary    Discussed pathophysiology of olecranon bursitis as well as treatment options. Failing conservative measures of ace wrap and rest, now with increasing pain, desires trial aspiration. Reviewed risks of infection (red flags to watch for) and possibility of re-accumulation. Discussed I would aspirate x1, and if  re-accumulation develops then would recommend SM or ortho eval to consider steroid injection. Joint fluid does not seem infected. will send off for crystal evaluation. Pt agrees with plan.       Relevant Orders   Synovial cell count + diff, w/ crystals       No orders of the defined types were placed in this encounter.  Orders Placed This Encounter  Procedures  . Synovial cell count + diff, w/ crystals    Follow up plan: No follow-ups on file.  Ria Bush, MD

## 2018-12-17 NOTE — Assessment & Plan Note (Signed)
Discussed pathophysiology of olecranon bursitis as well as treatment options. Failing conservative measures of ace wrap and rest, now with increasing pain, desires trial aspiration. Reviewed risks of infection (red flags to watch for) and possibility of re-accumulation. Discussed I would aspirate x1, and if re-accumulation develops then would recommend SM or ortho eval to consider steroid injection. Joint fluid does not seem infected. will send off for crystal evaluation. Pt agrees with plan.

## 2018-12-18 LAB — SYNOVIAL CELL COUNT + DIFF, W/ CRYSTALS
Basophils, %: 0 %
Eosinophils-Synovial: 0 % (ref 0–2)
Lymphocytes-Synovial Fld: 79 % — ABNORMAL HIGH (ref 0–74)
Monocyte/Macrophage: 12 % (ref 0–69)
Neutrophil, Synovial: 9 % (ref 0–24)
Synoviocytes, %: 0 % (ref 0–15)
WBC, Synovial: 359 cells/uL — ABNORMAL HIGH (ref <150)

## 2018-12-31 ENCOUNTER — Encounter: Payer: Self-pay | Admitting: Family Medicine

## 2018-12-31 DIAGNOSIS — M7021 Olecranon bursitis, right elbow: Secondary | ICD-10-CM

## 2018-12-31 NOTE — Telephone Encounter (Signed)
Refer to ortho for persistent olecranon bursitis.

## 2019-01-22 ENCOUNTER — Other Ambulatory Visit (INDEPENDENT_AMBULATORY_CARE_PROVIDER_SITE_OTHER): Payer: 59

## 2019-01-22 ENCOUNTER — Other Ambulatory Visit: Payer: 59

## 2019-01-22 DIAGNOSIS — E1122 Type 2 diabetes mellitus with diabetic chronic kidney disease: Secondary | ICD-10-CM

## 2019-01-22 DIAGNOSIS — N183 Chronic kidney disease, stage 3 (moderate): Secondary | ICD-10-CM | POA: Diagnosis not present

## 2019-01-22 DIAGNOSIS — G40209 Localization-related (focal) (partial) symptomatic epilepsy and epileptic syndromes with complex partial seizures, not intractable, without status epilepticus: Secondary | ICD-10-CM

## 2019-01-22 DIAGNOSIS — E1121 Type 2 diabetes mellitus with diabetic nephropathy: Secondary | ICD-10-CM

## 2019-01-22 DIAGNOSIS — R29898 Other symptoms and signs involving the musculoskeletal system: Secondary | ICD-10-CM

## 2019-01-22 DIAGNOSIS — Z79899 Other long term (current) drug therapy: Secondary | ICD-10-CM

## 2019-01-22 NOTE — Addendum Note (Signed)
Addended by: Ellamae Sia on: 01/22/2019 08:43 AM   Modules accepted: Orders

## 2019-01-25 ENCOUNTER — Ambulatory Visit: Payer: 59 | Admitting: Family Medicine

## 2019-01-25 ENCOUNTER — Ambulatory Visit (INDEPENDENT_AMBULATORY_CARE_PROVIDER_SITE_OTHER): Payer: 59 | Admitting: Family Medicine

## 2019-01-25 ENCOUNTER — Encounter: Payer: Self-pay | Admitting: Family Medicine

## 2019-01-25 ENCOUNTER — Other Ambulatory Visit: Payer: Self-pay

## 2019-01-25 VITALS — BP 140/68 | HR 81 | Temp 98.3°F | Ht 73.0 in | Wt 248.5 lb

## 2019-01-25 DIAGNOSIS — I10 Essential (primary) hypertension: Secondary | ICD-10-CM | POA: Diagnosis not present

## 2019-01-25 DIAGNOSIS — M7021 Olecranon bursitis, right elbow: Secondary | ICD-10-CM | POA: Diagnosis not present

## 2019-01-25 DIAGNOSIS — E1122 Type 2 diabetes mellitus with diabetic chronic kidney disease: Secondary | ICD-10-CM

## 2019-01-25 DIAGNOSIS — G40209 Localization-related (focal) (partial) symptomatic epilepsy and epileptic syndromes with complex partial seizures, not intractable, without status epilepticus: Secondary | ICD-10-CM

## 2019-01-25 DIAGNOSIS — N183 Chronic kidney disease, stage 3 (moderate): Secondary | ICD-10-CM

## 2019-01-25 DIAGNOSIS — E1121 Type 2 diabetes mellitus with diabetic nephropathy: Secondary | ICD-10-CM

## 2019-01-25 LAB — RENAL FUNCTION PANEL
Albumin: 4.2 g/dL (ref 3.6–5.1)
BUN/Creatinine Ratio: 11 (calc) (ref 6–22)
BUN: 19 mg/dL (ref 7–25)
CO2: 23 mmol/L (ref 20–32)
Calcium: 9.7 mg/dL (ref 8.6–10.3)
Chloride: 107 mmol/L (ref 98–110)
Creat: 1.66 mg/dL — ABNORMAL HIGH (ref 0.70–1.25)
Glucose, Bld: 122 mg/dL — ABNORMAL HIGH (ref 65–99)
Phosphorus: 3.5 mg/dL (ref 2.1–4.3)
Potassium: 4.3 mmol/L (ref 3.5–5.3)
Sodium: 141 mmol/L (ref 135–146)

## 2019-01-25 LAB — CK: Total CK: 50 U/L (ref 44–196)

## 2019-01-25 LAB — HEMOGLOBIN A1C
Hgb A1c MFr Bld: 6.2 % of total Hgb — ABNORMAL HIGH (ref <5.7)
Mean Plasma Glucose: 131 (calc)
eAG (mmol/L): 7.3 (calc)

## 2019-01-25 LAB — TSH: TSH: 1.76 mIU/L (ref 0.40–4.50)

## 2019-01-25 LAB — LEVETIRACETAM LEVEL: Keppra (Levetiracetam): 49.9 ug/mL — ABNORMAL HIGH (ref 12.0–46.0)

## 2019-01-25 NOTE — Assessment & Plan Note (Signed)
Stable stage 3 CKD reviewed with patient.

## 2019-01-25 NOTE — Assessment & Plan Note (Addendum)
Chronic, stable. Continue current regimen. Saw Dr Gabriel Carina, last seen ~2019.

## 2019-01-25 NOTE — Assessment & Plan Note (Signed)
Saw ortho, recommended compression wrap, rec against steroid injection or re aspiration.

## 2019-01-25 NOTE — Patient Instructions (Signed)
You are doing well today Continue current medicines.  Return in 6 months for physical.  

## 2019-01-25 NOTE — Assessment & Plan Note (Signed)
keppra levels pending.

## 2019-01-25 NOTE — Assessment & Plan Note (Signed)
Chronic, stable. Continue current regimen. 

## 2019-01-25 NOTE — Progress Notes (Signed)
This visit was conducted in person.  BP 140/68 (BP Location: Right Arm, Patient Position: Sitting, Cuff Size: Normal)    Pulse 81    Temp 98.3 F (36.8 C) (Oral)    Ht 6\' 1"  (1.854 m)    Wt 248 lb 8 oz (112.7 kg)    SpO2 96%    BMI 32.79 kg/m    CC: 4 mo f/u visit Subjective:    Patient ID: Kirk Bennett., male    DOB: 04/21/1951, 68 y.o.   MRN: [id number]  HPI: Kirk Bennett. is a 68 y.o. male presenting on 01/25/2019 for Follow-up (Here for 4 mo f/u.)   DM - does regularly check sugars 110-120. He finds PM snacks have helped AM sugars. Followed by Dr Gabriel Carina. Compliant with antihyperglycemic regimen which includes: farxiga, amaryl, metformin. Denies low sugars or hypoglycemic symptoms. Foot paresthesias. Last diabetic eye exam 10/2018. Pneumovax: 2013. Prevnar: 04/2018. Glucometer brand: one-touch. DSME: completed per patient. Lab Results  Component Value Date   HGBA1C 6.2 (H) 01/22/2019   Diabetic Foot Exam - Simple   Simple Foot Form Diabetic Foot exam was performed with the following findings:  Yes 01/25/2019  8:28 AM  Visual Inspection No deformities, no ulcerations, no other skin breakdown bilaterally:  Yes Sensation Testing Intact to touch and monofilament testing bilaterally:  Yes Pulse Check Posterior Tibialis and Dorsalis pulse intact bilaterally:  Yes Comments Dry skin to bilat soles    Lab Results  Component Value Date   MICROALBUR <0.2 09/11/2015     HTN - Compliant with current antihypertensive regimen of lisinopril 10mg  bid.  Does not check blood pressures at home.  No low blood pressure readings or symptoms of dizziness/syncope. Denies HA, vision changes, CP/tightness, SOB, leg swelling.       Relevant past medical, surgical, family and social history reviewed and updated as indicated. Interim medical history since our last visit reviewed. Allergies and medications reviewed and updated. Outpatient Medications Prior to Visit  Medication Sig Dispense  Refill   atorvastatin (LIPITOR) 20 MG tablet Take 1 tablet (20 mg total) by mouth daily. 90 tablet 3   Cholecalciferol (VITAMIN D3) 1000 units CAPS Take 1,000 Units by mouth daily.     dapagliflozin propanediol (FARXIGA) 10 MG TABS tablet TAKE 1 TABLET (10 MG TOTAL) BY MOUTH EVERY MORNING. 90 tablet 3   glimepiride (AMARYL) 2 MG tablet Take 1 tablet (2 mg total) by mouth daily with breakfast. 90 tablet 3   glucose blood (ONE TOUCH ULTRA TEST) test strip Use to check sugar fasting in the AM and 2 hours after lunch or supper. Dx: E11.21 100 each 3   levETIRAcetam (KEPPRA) 500 MG tablet Take 500 mg by mouth as directed. 2 tab AM, 3 tab PM     lisinopril (PRINIVIL,ZESTRIL) 10 MG tablet Take 1 tablet (10 mg total) by mouth 2 (two) times daily. 180 tablet 3   metFORMIN (GLUCOPHAGE) 1000 MG tablet TAKE 1 TABLET BY MOUTH TWICE A DAY WITH A MEAL 180 tablet 3   tamsulosin (FLOMAX) 0.4 MG CAPS capsule Take 1 capsule (0.4 mg total) by mouth daily. 90 capsule 3   Facility-Administered Medications Prior to Visit  Medication Dose Route Frequency Provider Last Rate Last Dose   triamcinolone acetonide (KENALOG) 10 MG/ML injection 10 mg  10 mg Other Once Landis Martins, DPM         Per HPI unless specifically indicated in ROS section below Review of Systems Objective:  BP 140/68 (BP Location: Right Arm, Patient Position: Sitting, Cuff Size: Normal)    Pulse 81    Temp 98.3 F (36.8 C) (Oral)    Ht 6\' 1"  (1.854 m)    Wt 248 lb 8 oz (112.7 kg)    SpO2 96%    BMI 32.79 kg/m   Wt Readings from Last 3 Encounters:  01/25/19 248 lb 8 oz (112.7 kg)  12/17/18 249 lb 7 oz (113.1 kg)  09/21/18 248 lb (112.5 kg)    Physical Exam Vitals signs and nursing note reviewed.  Constitutional:      General: He is not in acute distress.    Appearance: He is well-developed.  HENT:     Head: Normocephalic and atraumatic.     Right Ear: External ear normal.     Left Ear: External ear normal.     Nose: Nose  normal.     Mouth/Throat:     Pharynx: No oropharyngeal exudate.  Eyes:     General: No scleral icterus.    Conjunctiva/sclera: Conjunctivae normal.     Pupils: Pupils are equal, round, and reactive to light.  Neck:     Musculoskeletal: Normal range of motion and neck supple.  Cardiovascular:     Rate and Rhythm: Normal rate and regular rhythm.     Heart sounds: Normal heart sounds. No murmur.  Pulmonary:     Effort: Pulmonary effort is normal. No respiratory distress.     Breath sounds: Normal breath sounds. No wheezing or rales.  Musculoskeletal:     Comments: See HPI for foot exam if done  Lymphadenopathy:     Cervical: No cervical adenopathy.  Skin:    General: Skin is warm and dry.     Findings: No rash.       Results for orders placed or performed in visit on 01/25/19  [facility] DIABETES EYE EXAM  Result Value Ref Range   HM Diabetic Eye Exam No Retinopathy No Retinopathy   Assessment & Plan:   Problem List Items Addressed This Visit    Partial epilepsy with impairment of consciousness ([place])    keppra levels pending.       Olecranon bursitis of right elbow    Saw ortho, recommended compression wrap, rec against steroid injection or re aspiration.       Essential hypertension, benign    Chronic, stable. Continue current regimen.       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 Continue current regimen. Saw Dr Gabriel Carina, last seen ~2019.       CKD stage 3 due to type 2 diabetes mellitus ([place])    Stable stage 3 CKD reviewed with patient.           No orders of the defined types were placed in this encounter.  Orders Placed This Encounter  Procedures   [facility] DIABETES EYE EXAM    This external order was created through the Results Console.    Follow up plan: Return in about 6 months (around 07/27/2019) for annual exam, prior fasting for blood work.  Ria Bush, MD

## 2019-01-28 ENCOUNTER — Encounter: Payer: Self-pay | Admitting: Family Medicine

## 2019-01-31 ENCOUNTER — Telehealth: Payer: Self-pay | Admitting: Family Medicine

## 2019-01-31 NOTE — Telephone Encounter (Signed)
[facility] called today and stated the patient had lab drawn in our office. The lab work needed for [place] was drawn here also. Patient advised them that results stated his levels were elevated and they would like to know if the lab results could be faxed over to them for Dr Opal Sidles to see.  [phone] ATTN: Dr Opal Sidles  Phone- [phone]

## 2019-01-31 NOTE — Telephone Encounter (Signed)
Faxed labs per Dr. Coral[facility] results notes, 01/22/19.]

## 2019-04-16 ENCOUNTER — Ambulatory Visit (INDEPENDENT_AMBULATORY_CARE_PROVIDER_SITE_OTHER): Payer: 59

## 2019-04-16 DIAGNOSIS — Z23 Encounter for immunization: Secondary | ICD-10-CM

## 2019-05-07 ENCOUNTER — Other Ambulatory Visit: Payer: Self-pay

## 2019-05-07 ENCOUNTER — Ambulatory Visit: Payer: 59 | Attending: Nephrology | Admitting: Physical Therapy

## 2019-05-07 ENCOUNTER — Encounter: Payer: Self-pay | Admitting: Physical Therapy

## 2019-05-07 DIAGNOSIS — R296 Repeated falls: Secondary | ICD-10-CM | POA: Insufficient documentation

## 2019-05-07 DIAGNOSIS — R262 Difficulty in walking, not elsewhere classified: Secondary | ICD-10-CM | POA: Diagnosis present

## 2019-05-07 DIAGNOSIS — M6281 Muscle weakness (generalized): Secondary | ICD-10-CM | POA: Diagnosis not present

## 2019-05-07 NOTE — Therapy (Signed)
[facility] [address] [place], [place], [address] Phone: [phone]   Fax:  [phone]  Physical Therapy Evaluation  Patient Details  Name: Kirk Bennett. MRN: [id number] Date of Birth: 07-13-1951 Referring Provider (PT): Driscilla Moats, MD   Encounter Date: 05/07/2019    Past Medical History:  Diagnosis Date  . Cataracts, bilateral 02/2011   and suspected glaucoma, to return for f/u, no diabetic retinopathy  . CKD stage 3 due to type 2 diabetes mellitus ([place]) 2015   normal renal US, self referred to Dr Holley Raring  . 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 Mora Bellman at [facility])  . Depression    found by neuro  . Elevated PSA 05/27/2015   Serial monitoring ([place])   . Glaucoma 2015   suspect  . History of hepatitis A   . HLD (hyperlipidemia)   . HTN (hypertension)   . Knee pain    s/p replacement  . SVT (supraventricular tachycardia) ([place]) 1996  . Vitamin D deficiency 03/02/2015  . 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 Gabriel Carina endo --> 02/2016 decided to return to PCP for DM care    Past Surgical History:  Procedure Laterality Date  . CARDIAC CATHETERIZATION  July 2010   No blockages (Dr. Liliane Shi)  . COLONOSCOPY  09/16/2005   hyperplastic polyps, rpt due 10 yrs   . COLONOSCOPY WITH PROPOFOL N/A 12/11/2015   mult polyps, few TA, rpt 86yrs ([place])  . corrective surgery amblyopia  1957  . Cystectomy or meningioma removal brain  1990   (unclear)  . Left knee surgery  1971   Torn ACL  . REPLACEMENT TOTAL KNEE  April 2011   Left [facility] Dr. Garald Balding)    There were no vitals filed for this visit.       [facility] PT Assessment - 05/07/19 0001      Assessment   Referring Provider (PT)  Driscilla Moats, MD    Onset Date/Surgical Date  05/06/[birth year]    Prior Therapy  Yes  for knees, none for balance      Precautions   Precautions  Fall      Balance Screen   Has the patient fallen in the past 6 months  Yes    How many times?  4    Has the patient had a decrease in activity level because of a fear of falling?   Yes    Is the patient reluctant to leave their home because of a fear of falling?   Yes      [place]  Private residence    Living Arrangements  Spouse/significant other    Type of [place] to enter    Entrance Stairs-Number of Steps  4    Entrance Stairs-Rails  Right    [place] to live on main level with bedroom/bathroom      Prior Function   Level of [place] Chief complaint: Patient has B knee pain and limited motion. R knee greater pain than L knee; L knee feels like there are things blocking the movement. Patient denies any buckling on R, with occasional buckling on L. Patient reports that his R ankle occasionally gives way which has  resulted in falls; L knee has also occasionally buckled resulting in a fall. Patient has had some dizziness and as a result has not been riding his bike. Patient notes that he has also fallen on out of the bed because he becomes disoriented about where he is in space. Patients goals for therapy are: get up from sitting on a low surface without trouble; walk where he wants to when he wants to; stop falling as much; manage pain better.  Imaging: None current Recent changes in overall health/medication: No Directional pattern for falls: None noted. Prior history of physical therapy for balance: None  Red flags (bowel/bladder changes, saddle paresthesia, personal history of cancer, chills/fever, night sweats, unrelenting pain) Negative  OBJECTIVE  MUSCULOSKELETAL: Tremor: Absent Bulk: Normal Tone: Normal, no clonus  Posture Forward trunk lean  Gait Decreased foot clearance B, R >L. Shuffling gait with  forward trunk lean. After increased duration, trendelenbrug gait noted B, L>R.   Strength R/L 4/3+ Hip flexion 5/5 Hip external rotation 4/4 Hip internal rotation 5/5 Hip extension  5/5 Hip abduction (seated) 3+/3+ Hip adduction (seated) 5/5 Knee extension 4/4 Knee flexion 5/5 Ankle Plantarflexion 5/5 Ankle Dorsiflexion   NEUROLOGICAL:  Mental Status Patient is oriented to person, place and time.  Recent memory is intact.  Remote memory is intact.  Attention span and concentration are intact.  Expressive speech is intact.  Patient's fund of knowledge is within normal limits for educational level.  Cranial Nerves Visual acuity and visual fields are intact  Extraocular muscles are intact  Facial sensation is intact bilaterally  Facial strength is intact bilaterally  Hearing is normal as tested by gross conversation Palate elevates midline, normal phonation  Shoulder shrug strength is intact  Tongue protrudes midline   Sensation Grossly intact to light touch bilateral UEs/LEs as determined by testing dermatomes C2-T2/L2-S2 respectively Proprioception and hot/cold testing deferred on this date  Reflexes R/L 2+/2+ Ankle Jerk (S1/2)  Coordination/Cerebellar Heel to Shin: WNL Finger Opposition: WNL   FUNCTIONAL OUTCOME MEASURES   Results Comments  5TSTS 14.3 seconds (from elevated surface with 90 degree at hip and knee)  Fall risk, in need of intervention  6 Minute Walk Test 624 feet; terminated test at 4 min 16 s 2/2 to fatigue in legs 1250 feet less than norm for age group indicating decreased function and increased risk of fall  ABC Scale 69.69% Fall risk, in need of intervention    ASSESSMENT Patient is a 68 year old presenting to clinic with chief complaints of decreased strength and mobility in BLE with joint pain throughout BLE. Upon examination, patient demonstrates deficits in BLE strength, pain, function, balance, gait, and posture as evidenced by anterior  trunk lean in standing and during gait, decreased foot clearance during gait R>L, hx of falls, inability to walk for 6 minutes at self-selected pace, difficulty and unsteadiness when rising from a standard height chair, and MMT gross 4/5 in BLE. Patient's responses on ABC Scale outcome measures (69.69%) indicate considerable functional limitations/disability/distress. Patient's progress may be limited due to comorbidities; however, patient's motivation is advantageous. Patient was able to achieve basic understanding of balance systems during today's evaluation and responded positively to educational interventions. Patient will benefit from continued skilled therapeutic intervention to address deficits in BLE strength, pain, function, balance, gait, and posture in order to decrease risk of falls, increase function, and improve overall QOL.   Objective measurements completed on examination: See above findings.   TREATMENT  Neuromuscular Re-education: Patient educated on  basics of balance: systems, strategies, and falls risk prevention.   Patient educated on prognosis, POC, and provided with HEP including: basic balance awareness during daily activities. Patient articulated understanding and returned demonstration. Patient will benefit from further education in order to maximize compliance and understanding for long-term therapeutic gains.   PT Long Term Goals - 05/08/19 [id number]      PT LONG TERM GOAL #1   Title  Patient will be independent with HEP in order to improve strength and balance in order to decrease fall risk and improve function at home and work.    Baseline  IE: not initiated    Time  4    Period  Weeks    Status  New    Target Date  06/05/19      PT LONG TERM GOAL #2   Title  Patient will improve ABC by at least 13% in order to demonstrate clinically significant improvement in balance confidence.    Baseline  IE: 69.69%    Time  4    Period  Weeks    Status  New    Target Date   06/05/19      PT LONG TERM GOAL #3   Title  Patient will decrease 5TSTS by at least 3 seconds and perform from a standard height chair in order to demonstrate clinically significant improvement in LE strength.    Baseline  IE: 14.3 sec from an elevated surface    Time  4    Period  Weeks    Status  New    Target Date  06/05/19      PT LONG TERM GOAL #4   Title  Patient will increase 6MWT by at least 49m (120ft) in order to demonstrate clinically significant improvement in cardiopulmonary endurance and community ambulation.    Baseline  IE: 624 feet    Time  4    Period  Weeks    Status  New    Target Date  06/05/19            Plan - 05/08/19 [id number]    Clinical Impression Statement  Patient is a 68 year old presenting to clinic with chief complaints of decreased strength and mobility in BLE with joint pain throughout BLE. Upon examination, patient demonstrates deficits in BLE strength, pain, function, balance, gait, and posture as evidenced by anterior trunk lean in standing and during gait, decreased foot clearance during gait R>L, hx of falls, inability to walk for 6 minutes at self-selected pace, difficulty and unsteadiness when rising from a standard height chair, and MMT gross 4/5 in BLE. Patient's responses on ABC Scale outcome measures (69.69%) indicate considerable functional limitations/disability/distress. Patient's progress may be limited due to comorbidities; however, patient's motivation is advantageous. Patient was able to achieve basic understanding of balance systems during today's evaluation and responded positively to educational interventions. Patient will benefit from continued skilled therapeutic intervention to address deficits in BLE strength, pain, function, balance, gait, and posture in order to decrease risk of falls, increase function, and improve overall QOL.    Personal Factors and Comorbidities  Age;Behavior Pattern;Comorbidity 3+;Fitness;Past/Current  Experience;Time since onset of injury/illness/exacerbation    Comorbidities  CKD Stage 3, HLD, HTN, DM, hx of seizures, depression    Examination-Activity Limitations  Squat;Bend;Locomotion Level;Stairs;Bathing;Lift;Transfers;Carry    Examination-Participation Restrictions  Shop;Cleaning;Meal Prep;Yard Work;Community Activity;Interpersonal Relationship    Stability/Clinical Decision Making  Evolving/Moderate complexity    Clinical Decision Making  Moderate    Rehab Potential  Fair  PT Frequency  2x / week    PT Duration  4 weeks    PT Treatment/Interventions  ADLs/Self Care Home Management;Aquatic Therapy;Cryotherapy;Moist Heat;Stair training;Gait training;DME Instruction;Functional mobility training;Therapeutic activities;Therapeutic exercise;Balance training;Patient/family education;Neuromuscular re-education;Manual techniques;Energy conservation;Taping    PT Next Visit Plan  BLE strengthening, graded activity    PT Home Exercise Plan  to be provided at next visit    Consulted and Agree with Plan of Care  Patient           Patient will benefit from skilled therapeutic intervention in order to improve the following deficits and impairments:  Abnormal gait, Decreased balance, Decreased endurance, Decreased mobility, Difficulty walking, Hypomobility, Pain, Postural dysfunction, Decreased strength, Decreased activity tolerance, Decreased range of motion, Improper body mechanics  Visit Diagnosis: Muscle weakness (generalized)  Difficulty in walking, not elsewhere classified  Repeated falls     Problem List Patient Active Problem List   Diagnosis Date Noted  . Olecranon bursitis of right elbow 12/17/2018  . Advanced care planning/counseling discussion 09/22/2018  . Weakness of both lower extremities 09/22/2018  . History of hepatitis 01/05/2018  . 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 NAFLD (nonalcoholic fatty liver disease) 09/30/2017  . Obesity, Class I, BMI  30.0-34.9 (see actual BMI) 09/18/2017  . Polyarthralgia 02/15/2017  . OSA (obstructive sleep apnea) 02/15/2017  . Partial epilepsy with impairment of consciousness ([place]) 01/29/2016  . Constipation 07/13/2015  . Low vitamin B12 level 05/27/2015  . Elevated PSA 05/27/2015  . Vitamin D deficiency 03/02/2015  . CKD stage 3 due to type 2 diabetes mellitus ([place])   . Other long term (current) drug therapy 03/12/2012  . Health maintenance examination 04/26/2011  . TOTAL KNEE REPLACEMENT, LEFT, HX OF 04/07/2010  . Controlled type 2 diabetes mellitus with diabetic nephropathy ([place]) 03/30/2010  . Hyperlipidemia 03/30/2010  . 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 Essential hypertension, benign 03/30/2010   Myles Gip PT, DPT [phone] 05/07/2019, 9:59 AM  [facility] [address] [place], [place], [address] Phone: [phone]   Fax:  [phone]  Name: Kirk Bennett. MRN: [id number] Date of Birth: July 07, 1951

## 2019-05-09 ENCOUNTER — Encounter: Payer: Self-pay | Admitting: Physical Therapy

## 2019-05-09 ENCOUNTER — Ambulatory Visit: Payer: 59 | Admitting: Physical Therapy

## 2019-05-09 ENCOUNTER — Other Ambulatory Visit: Payer: Self-pay

## 2019-05-09 DIAGNOSIS — R262 Difficulty in walking, not elsewhere classified: Secondary | ICD-10-CM

## 2019-05-09 DIAGNOSIS — M6281 Muscle weakness (generalized): Secondary | ICD-10-CM | POA: Diagnosis not present

## 2019-05-09 DIAGNOSIS — R296 Repeated falls: Secondary | ICD-10-CM

## 2019-05-09 NOTE — Therapy (Signed)
[facility] [address] [place], [place], [address] Phone: [phone]   Fax:  [phone]  Physical Therapy Treatment  Patient Details  Name: Kirk Bennett. MRN: [id number] Date of Birth: [birth year] Referring Provider (PT): Driscilla Moats, MD   Encounter Date: 05/09/2019  PT End of Session - 05/09/19 1240    Visit Number  2    Number of Visits  9    Date for PT Re-Evaluation  06/05/19    PT Start Time  [id number]    PT Stop Time  1340    PT Time Calculation (min)  45 min    Equipment Utilized During Treatment  Gait belt    Activity Tolerance  Patient tolerated treatment well;Patient limited by fatigue    Behavior During Therapy  WFL for tasks assessed/performed       Past Medical History:  Diagnosis Date  . Cataracts, bilateral 02/2011   and suspected glaucoma, to return for f/u, no diabetic retinopathy  . CKD stage 3 due to type 2 diabetes mellitus ([place]) 2015   normal renal US, self referred to Dr Holley Raring  . Complex partial seizures ([place]) 1990   from surgery for R temporal arachnoid cyst s/p drainage, possible continued sz so changed to lamotrigine ([facility]. Mora Bellman at [facility])  . Depression    found by neuro  . Elevated PSA 05/27/2015   Serial monitoring ([place])   . Glaucoma 2015   suspect  . History of hepatitis A   . HLD (hyperlipidemia)   . HTN (hypertension)   . Knee pain    s/p replacement  . SVT (supraventricular tachycardia) ([place]) 1996  . Vitamin D deficiency 03/02/2015  . 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 Gabriel Carina endo --> 02/2016 decided to return to PCP for DM care    Past Surgical History:  Procedure Laterality Date  . CARDIAC CATHETERIZATION  July 2010   No blockages (Dr. Liliane Shi)  . COLONOSCOPY  09/16/2005   hyperplastic polyps, rpt due 10 yrs   . COLONOSCOPY WITH PROPOFOL N/A 12/11/2015   mult polyps, few TA, rpt 64yrs ([place])  . corrective surgery  amblyopia  1957  . Cystectomy or meningioma removal brain  1990   (unclear)  . Left knee surgery  1971   Torn ACL  . REPLACEMENT TOTAL KNEE  April 2011   Left [facility] Dr. Garald Balding)    There were no vitals filed for this visit.  Subjective Assessment - 05/09/19 1251    Subjective  Patient reports no significant changes or concerns since his evaluation.      TREATMENT  Neuromuscular Re-education: DGI performed: 14/24 indicative of fall risk. Ankle strategy introduction (CGA/SBA)  Airex static stand, EO x2 min  Airex static stand, EO, with perturbations x1 min  Airex static stand, EO, UE overhead reach 5#, x10  Airex static stand, EO, NBOS, UE overhead reach 5#, x10  Airex static stand, EO, UE rotations 5#, x10B  Airex static stand, EO, NBOS, UE rotations 5#, x10B  Airex balance beam, EO, lateral walking, x3 laps Tandem walking x 1 lap, Backward tandem walking x1 lap, CGA  Therapeutic Exercise: Sci Fit, seat 16, L4.0, 2.5 min fwd/bwd for warm-up and leg strengthening 4# CW, LAQ, eccentric, 2x12, BLE 4# CW, heel raises, x15, BLE 4# CW, toe raises, x15, BLE 4# CW, hip flexion march with toe tap/support, SUE support, x10 each leg, x20 alternating  Patient educated throughout session  on appropriate technique and form using multi-modal cueing, HEP, and activity modification. Patient articulated understanding and returned demonstration.  Patient Response to interventions: Patient denies increased fatigue; found all activities easy/medium.  ASSESSMENT Patient presents to clinic with excellent motivation to participate in therapy. Patient demonstrates deficits in BLE strength, pain, function, balance, gait, and posture. Patient able to achieve basic understanding of ankle strategy for balance recovery during today's session and responded positively to active interventions. Patient will benefit from continued skilled therapeutic intervention to address remaining deficits in BLE strength,  pain, function, balance, gait, and posture in order to decrease risk of fall, increase function, and improve overall QOL.        PT Long Term Goals - 05/08/19 [id number]      PT LONG TERM GOAL #1   Title  Patient will be independent with HEP in order to improve strength and balance in order to decrease fall risk and improve function at home and work.    Baseline  IE: not initiated    Time  4    Period  Weeks    Status  New    Target Date  06/05/19      PT LONG TERM GOAL #2   Title  Patient will improve ABC by at least 13% in order to demonstrate clinically significant improvement in balance confidence.    Baseline  IE: 69.69%    Time  4    Period  Weeks    Status  New    Target Date  06/05/19      PT LONG TERM GOAL #3   Title  Patient will decrease 5TSTS by at least 3 seconds and perform from a standard height chair in order to demonstrate clinically significant improvement in LE strength.    Baseline  IE: 14.3 sec from an elevated surface    Time  4    Period  Weeks    Status  New    Target Date  06/05/19      PT LONG TERM GOAL #4   Title  Patient will increase 6MWT by at least 56m (18ft) in order to demonstrate clinically significant improvement in cardiopulmonary endurance and community ambulation.    Baseline  IE: 624 feet    Time  4    Period  Weeks    Status  New    Target Date  06/05/19            Plan - 05/09/19 1240    Clinical Impression Statement  Patient presents to clinic with excellent motivation to participate in therapy. Patient demonstrates deficits in BLE strength, pain, function, balance, gait, and posture. Patient able to achieve basic understanding of ankle strategy for balance recovery during today's session and responded positively to active interventions. Patient will benefit from continued skilled therapeutic intervention to address remaining deficits in BLE strength, pain, function, balance, gait, and posture in order to decrease risk of fall,  increase function, and improve overall QOL.    Personal Factors and Comorbidities  Age;Behavior Pattern;Comorbidity 3+;Fitness;Past/Current Experience;Time since onset of injury/illness/exacerbation    Comorbidities  CKD Stage 3, HLD, HTN, DM, hx of seizures, depression    Examination-Activity Limitations  Squat;Bend;Locomotion Level;Stairs;Bathing;Lift;Transfers;Carry    Examination-Participation Restrictions  Shop;Cleaning;Meal Prep;Yard Work;Community Activity;Interpersonal Relationship    Stability/Clinical Decision Making  Evolving/Moderate complexity    Rehab Potential  Fair    PT Frequency  2x / week    PT Duration  4 weeks    PT Treatment/Interventions  ADLs/Self  Care Home Management;Aquatic Therapy;Cryotherapy;Moist Heat;Stair training;Gait training;DME Instruction;Functional mobility training;Therapeutic activities;Therapeutic exercise;Balance training;Patient/family education;Neuromuscular re-education;Manual techniques;Energy conservation;Taping    PT Next Visit Plan  BLE strengthening, graded activity    PT Home Exercise Plan  to be provided at next visit    Consulted and Agree with Plan of Care  Patient       Patient will benefit from skilled therapeutic intervention in order to improve the following deficits and impairments:  Abnormal gait, Decreased balance, Decreased endurance, Decreased mobility, Difficulty walking, Hypomobility, Pain, Postural dysfunction, Decreased strength, Decreased activity tolerance, Decreased range of motion, Improper body mechanics  Visit Diagnosis: Muscle weakness (generalized)  Difficulty in walking, not elsewhere classified  Repeated falls     Problem List Patient Active Problem List   Diagnosis Date Noted  . Olecranon bursitis of right elbow 12/17/2018  . Advanced care planning/counseling discussion 09/22/2018  . Weakness of both lower extremities 09/22/2018  . History of hepatitis 01/05/2018  . LAFB (left anterior fascicular block)  01/05/2018  . NAFLD (nonalcoholic fatty liver disease) 09/30/2017  . Obesity, Class I, BMI 30.0-34.9 (see actual BMI) 09/18/2017  . Polyarthralgia 02/15/2017  . OSA (obstructive sleep apnea) 02/15/2017  . Partial epilepsy with impairment of consciousness ([place]) 01/29/2016  . Constipation 07/13/2015  . Low vitamin B12 level 05/27/2015  . Elevated PSA 05/27/2015  . Vitamin D deficiency 03/02/2015  . CKD stage 3 due to type 2 diabetes mellitus ([place])   . Other long term (current) drug therapy 03/12/2012  . Health maintenance examination 04/26/2011  . TOTAL KNEE REPLACEMENT, LEFT, HX OF 04/07/2010  . Controlled type 2 diabetes mellitus with diabetic nephropathy ([place]) 03/30/2010  . Hyperlipidemia 03/30/2010  . 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 Essential hypertension, benign 03/30/2010   Myles Gip PT, DPT [phone] 05/09/2019, 7:18 PM  [facility] [address] [place], [place], [address] Phone: [phone]   Fax:  [phone]  Name: Kirk Bennett. MRN: [id number] Date of Birth: 03-28-1951

## 2019-05-13 ENCOUNTER — Ambulatory Visit: Payer: 59 | Admitting: Physical Therapy

## 2019-05-13 ENCOUNTER — Encounter: Payer: Self-pay | Admitting: Physical Therapy

## 2019-05-13 ENCOUNTER — Other Ambulatory Visit: Payer: Self-pay

## 2019-05-13 DIAGNOSIS — M6281 Muscle weakness (generalized): Secondary | ICD-10-CM | POA: Diagnosis not present

## 2019-05-13 DIAGNOSIS — R296 Repeated falls: Secondary | ICD-10-CM

## 2019-05-13 DIAGNOSIS — R262 Difficulty in walking, not elsewhere classified: Secondary | ICD-10-CM

## 2019-05-13 NOTE — Therapy (Signed)
[facility] [address] [place], [place], [address] Phone: [phone]   Fax:  [phone]  Physical Therapy Treatment  Patient Details  Name: Kirk Bennett. MRN: [id number] Date of Birth: May 18, 1951 Referring Provider (PT): Driscilla Moats, MD   Encounter Date: 05/13/2019  PT End of Session - 05/13/19 1758    Visit Number  3    Number of Visits  9    Date for PT Re-Evaluation  06/05/19    PT Start Time  [id number]    PT Stop Time  1442    PT Time Calculation (min)  45 min    Equipment Utilized During Treatment  Gait belt    Activity Tolerance  Patient tolerated treatment well    Behavior During Therapy  WFL for tasks assessed/performed       Past Medical History:  Diagnosis Date  . Cataracts, bilateral 02/2011   and suspected glaucoma, to return for f/u, no diabetic retinopathy  . CKD stage 3 due to type 2 diabetes mellitus ([place]) 2015   normal renal US, self referred to Dr Holley Raring  . 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 Mora Bellman at [facility])  . Depression    found by neuro  . Elevated PSA 05/27/2015   Serial monitoring ([place])   . Glaucoma 2015   suspect  . History of hepatitis A   . HLD (hyperlipidemia)   . HTN (hypertension)   . Knee pain    s/p replacement  . SVT (supraventricular tachycardia) ([place]) 1996  . Vitamin D deficiency 03/02/2015  . 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 Gabriel Carina endo --> 02/2016 decided to return to PCP for DM care    Past Surgical History:  Procedure Laterality Date  . CARDIAC CATHETERIZATION  July 2010   No blockages (Dr. Liliane Shi)  . COLONOSCOPY  09/16/2005   hyperplastic polyps, rpt due 10 yrs   . COLONOSCOPY WITH PROPOFOL N/A 12/11/2015   mult polyps, few TA, rpt 14yrs ([place])  . corrective surgery amblyopia  1957  .  Cystectomy or meningioma removal brain  1990   (unclear)  . Left knee surgery  1971   Torn ACL  . REPLACEMENT TOTAL KNEE  April 2011   Left [facility] Dr. Garald Balding)    There were no vitals filed for this visit.  Subjective Assessment - 05/13/19 1758    Subjective  Patient denies any falls, concerns, or significant changes.    Currently in Pain?  No/denies      TREATMENT  Therapeutic Exercise: NuStep, L5, seat 15, x5 min for warm-up and leg strengthening (hamstring cramping) Hamstring stretch, stair, BLE, x2 min each High box squat 2x10 High box sit to stand R bias 2x5, L bias 2x5 5# CW, LAQ, eccentric, 2x12, BLE 5# CW, hip extension march BLE 2x12 5# CW, hip flexion march BLE 2x12 5# CW, lateral walking, x5 laps Reviewed and collaborated on [employer].  Patient educated throughout session on appropriate technique and form using multi-modal cueing, HEP, and activity modification. Patient articulated understanding and returned demonstration.  Patient Response to interventions: Patient requiring increased rest between activities 2/2 to fatigue but reports the exercise as moderate difficulty.  ASSESSMENT Patient presents to clinic with excellent motivation to participate in therapy. Patient demonstrates deficits in BLE strength, pain, function, balance, gait, and posture. Patient able  to perform higher reps of  Resisted exercises with increased resistance during today's session and responded positively to active interventions. Patient will benefit from continued skilled therapeutic intervention to address remaining deficits in BLE strength, pain, function, balance, gait, and posture in order to decrease risk of fall, increase function, and improve overall QOL.     PT Long Term Goals - 05/08/19 [id number]      PT LONG TERM GOAL #1   Title  Patient will be independent with HEP in order to improve strength and balance in order to decrease fall risk and improve function at home and work.     Baseline  IE: not initiated    Time  4    Period  Weeks    Status  New    Target Date  06/05/19      PT LONG TERM GOAL #2   Title  Patient will improve ABC by at least 13% in order to demonstrate clinically significant improvement in balance confidence.    Baseline  IE: 69.69%    Time  4    Period  Weeks    Status  New    Target Date  06/05/19      PT LONG TERM GOAL #3   Title  Patient will decrease 5TSTS by at least 3 seconds and perform from a standard height chair in order to demonstrate clinically significant improvement in LE strength.    Baseline  IE: 14.3 sec from an elevated surface    Time  4    Period  Weeks    Status  New    Target Date  06/05/19      PT LONG TERM GOAL #4   Title  Patient will increase 6MWT by at least 18m (19ft) in order to demonstrate clinically significant improvement in cardiopulmonary endurance and community ambulation.    Baseline  IE: 624 feet    Time  4    Period  Weeks    Status  New    Target Date  06/05/19            Plan - 05/13/19 1759    Clinical Impression Statement  Patient presents to clinic with excellent motivation to participate in therapy. Patient demonstrates deficits in BLE strength, pain, function, balance, gait, and posture. Patient able to perform higher reps of  Resisted exercises with increased resistance during today's session and responded positively to active interventions. Patient will benefit from continued skilled therapeutic intervention to address remaining deficits in BLE strength, pain, function, balance, gait, and posture in order to decrease risk of fall, increase function, and improve overall QOL.    Personal Factors and Comorbidities  Age;Behavior Pattern;Comorbidity 3+;Fitness;Past/Current Experience;Time since onset of injury/illness/exacerbation    Comorbidities  CKD Stage 3, HLD, HTN, DM, hx of seizures, depression    Examination-Activity Limitations  Squat;Bend;Locomotion  Level;Stairs;Bathing;Lift;Transfers;Carry    Examination-Participation Restrictions  Shop;Cleaning;Meal Prep;Yard Work;Community Activity;Interpersonal Relationship    Stability/Clinical Decision Making  Evolving/Moderate complexity    Rehab Potential  Fair    PT Frequency  2x / week    PT Duration  4 weeks    PT Treatment/Interventions  ADLs/Self Care Home Management;Aquatic Therapy;Cryotherapy;Moist Heat;Stair training;Gait training;DME Instruction;Functional mobility training;Therapeutic activities;Therapeutic exercise;Balance training;Patient/family education;Neuromuscular re-education;Manual techniques;Energy conservation;Taping    PT Next Visit Plan  BLE strengthening, graded activity    PT Home Exercise Plan  Seated and standing hip strengthening    Consulted and Agree with Plan of Care  Patient  Patient will benefit from skilled therapeutic intervention in order to improve the following deficits and impairments:  Abnormal gait, Decreased balance, Decreased endurance, Decreased mobility, Difficulty walking, Hypomobility, Pain, Postural dysfunction, Decreased strength, Decreased activity tolerance, Decreased range of motion, Improper body mechanics  Visit Diagnosis: Muscle weakness (generalized)  Difficulty in walking, not elsewhere classified  Repeated falls     Problem List Patient Active Problem List   Diagnosis Date Noted  . Olecranon bursitis of right elbow 12/17/2018  . Advanced care planning/counseling discussion 09/22/2018  . Weakness of both lower extremities 09/22/2018  . History of hepatitis 01/05/2018  . 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 NAFLD (nonalcoholic fatty liver disease) 09/30/2017  . Obesity, Class I, BMI 30.0-34.9 (see actual BMI) 09/18/2017  . Polyarthralgia 02/15/2017  . OSA (obstructive sleep apnea) 02/15/2017  . Partial epilepsy with impairment of consciousness ([place]) 01/29/2016  . Constipation 07/13/2015  . Low vitamin B12  level 05/27/2015  . Elevated PSA 05/27/2015  . Vitamin D deficiency 03/02/2015  . CKD stage 3 due to type 2 diabetes mellitus ([place])   . Other long term (current) drug therapy 03/12/2012  . Health maintenance examination 04/26/2011  . TOTAL KNEE REPLACEMENT, LEFT, HX OF 04/07/2010  . Controlled type 2 diabetes mellitus with diabetic nephropathy ([place]) 03/30/2010  . Hyperlipidemia 03/30/2010  . 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 Essential hypertension, benign 03/30/2010   Myles Gip PT, DPT [phone] 05/13/2019, 6:05 PM  [facility] [address] [place], [place], [address] Phone: [phone]   Fax:  [phone]  Name: Kirk Bennett. MRN: [id number] Date of Birth: [birth year]

## 2019-05-15 ENCOUNTER — Ambulatory Visit: Payer: 59 | Admitting: Physical Therapy

## 2019-05-15 ENCOUNTER — Other Ambulatory Visit: Payer: Self-pay

## 2019-05-15 DIAGNOSIS — R262 Difficulty in walking, not elsewhere classified: Secondary | ICD-10-CM

## 2019-05-15 DIAGNOSIS — R296 Repeated falls: Secondary | ICD-10-CM

## 2019-05-15 DIAGNOSIS — M6281 Muscle weakness (generalized): Secondary | ICD-10-CM

## 2019-05-16 ENCOUNTER — Encounter: Payer: Self-pay | Admitting: Physical Therapy

## 2019-05-16 NOTE — Therapy (Signed)
[facility] [address] [place], [place], [address] Phone: [phone]   Fax:  [phone]  Physical Therapy Treatment  Patient Details  Name: Kirk Bennett. MRN: [id number] Date of Birth: 08-05-1951 Referring Provider (PT): Driscilla Moats, MD   Encounter Date: 05/15/2019  PT End of Session - 05/16/19 0854    Visit Number  4    Number of Visits  9    Date for PT Re-Evaluation  06/05/19    PT Start Time  1346    PT Stop Time  1427    PT Time Calculation (min)  41 min    Equipment Utilized During Treatment  Gait belt    Activity Tolerance  Patient tolerated treatment well    Behavior During Therapy  WFL for tasks assessed/performed       Past Medical History:  Diagnosis Date  . Cataracts, bilateral 02/2011   and suspected glaucoma, to return for f/u, no diabetic retinopathy  . CKD stage 3 due to type 2 diabetes mellitus ([place]) 2015   normal renal US, self referred to Dr Holley Raring  . 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 Mora Bellman at [facility])  . Depression    found by neuro  . Elevated PSA 05/27/2015   Serial monitoring ([place])   . Glaucoma 2015   suspect  . History of hepatitis A   . HLD (hyperlipidemia)   . HTN (hypertension)   . Knee pain    s/p replacement  . SVT (supraventricular tachycardia) ([place]) 1996  . Vitamin D deficiency 03/02/2015  . 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 Gabriel Carina endo --> 02/2016 decided to return to PCP for DM care    Past Surgical History:  Procedure Laterality Date  . CARDIAC CATHETERIZATION  July 2010   No blockages (Dr. Liliane Shi)  . COLONOSCOPY  09/16/2005   hyperplastic polyps, rpt due 10 yrs   . COLONOSCOPY WITH PROPOFOL N/A 12/11/2015   mult polyps, few TA, rpt 79yrs ([place])  . corrective surgery amblyopia  1957  .  Cystectomy or meningioma removal brain  1990   (unclear)  . Left knee surgery  1971   Torn ACL  . REPLACEMENT TOTAL KNEE  April 2011   Left [facility] Dr. Garald Balding)    There were no vitals filed for this visit.  Subjective Assessment - 05/16/19 0853    Subjective  Patient states that he was not too fatigued after the sesion on Monday. He felt a good workout but was not worn out. Denies any falls but reports he continues to feel unsteady with picking things up from the floor and rising from standard/low height chairs.    Currently in Pain?  No/denies       TREATMENT  Therapeutic Exercise: NuStep, L3-5, seat 15, x8 min for warm-up and leg strengthening and general cardiovascular conditioning  High box squat 1x10 (4 inch elevation) 1x10 (2 in elevation) Standing lumbar extension x10  Neuromuscular Re-education: Split stance modified deadlift to practice wide BOS for safe function at home and balance recovery when bending forward x18, SBA, no UE support, no LOB (BLE) Nautilus posterior postural strengthening for improved control of center of mass:  #30 narrow rows 2x15  #30 wide rows 2x15  #30 lat pull down 2x15   Patient educated throughout session on appropriate technique and form  using multi-modal cueing, HEP, and activity modification. Patient articulated understanding and returned demonstration.  Patient Response to interventions: Patient denies any fatigue but does require increased rest breaks throughout the session.  ASSESSMENT Patient presents to clinic with excellent motivation to participate in therapy. Patient demonstrates deficits in BLE strength, pain, function, balance, gait, and posture. Patient able to perform balance activities with SBA and demonstrates good postural control with return to standing from floor reach during today's session and responded positively to active interventions. Of note, patient has visible superior DRAM during seated posterior postural  strengthening activities and does not manage IAP well during exertion; if receptive, will benefit from education on IAP management to prevent hernia. Patient will benefit from continued skilled therapeutic intervention to address remaining deficits in BLE strength, pain, function, balance, gait, and posture in order to decrease risk of fall, increase function, and improve overall QOL.       PT Long Term Goals - 05/08/19 [id number]      PT LONG TERM GOAL #1   Title  Patient will be independent with HEP in order to improve strength and balance in order to decrease fall risk and improve function at home and work.    Baseline  IE: not initiated    Time  4    Period  Weeks    Status  New    Target Date  06/05/19      PT LONG TERM GOAL #2   Title  Patient will improve ABC by at least 13% in order to demonstrate clinically significant improvement in balance confidence.    Baseline  IE: 69.69%    Time  4    Period  Weeks    Status  New    Target Date  06/05/19      PT LONG TERM GOAL #3   Title  Patient will decrease 5TSTS by at least 3 seconds and perform from a standard height chair in order to demonstrate clinically significant improvement in LE strength.    Baseline  IE: 14.3 sec from an elevated surface    Time  4    Period  Weeks    Status  New    Target Date  06/05/19      PT LONG TERM GOAL #4   Title  Patient will increase 6MWT by at least 71m (172ft) in order to demonstrate clinically significant improvement in cardiopulmonary endurance and community ambulation.    Baseline  IE: 624 feet    Time  4    Period  Weeks    Status  New    Target Date  06/05/19            Plan - 05/16/19 1357    Clinical Impression Statement  Patient presents to clinic with excellent motivation to participate in therapy. Patient demonstrates deficits in BLE strength, pain, function, balance, gait, and posture. Patient able to perform balance activities with SBA and demonstrates good postural  control with return to standing from floor reach during today's session and responded positively to active interventions. Of note, patient has visible superior DRAM during seated posterior postural strengthening activities and does not manage IAP well during exertion; if receptive, will benefit from education on IAP management to prevent hernia. Patient will benefit from continued skilled therapeutic intervention to address remaining deficits in BLE strength, pain, function, balance, gait, and posture in order to decrease risk of fall, increase function, and improve overall QOL.    Personal Factors and Comorbidities  Age;Behavior  Pattern;Comorbidity 3+;Fitness;Past/Current Experience;Time since onset of injury/illness/exacerbation    Comorbidities  CKD Stage 3, HLD, HTN, DM, hx of seizures, depression    Examination-Activity Limitations  Squat;Bend;Locomotion Level;Stairs;Bathing;Lift;Transfers;Carry    Examination-Participation Restrictions  Shop;Cleaning;Meal Prep;Yard Work;Community Activity;Interpersonal Relationship    Stability/Clinical Decision Making  Evolving/Moderate complexity    Rehab Potential  Fair    PT Frequency  2x / week    PT Duration  4 weeks    PT Treatment/Interventions  ADLs/Self Care Home Management;Aquatic Therapy;Cryotherapy;Moist Heat;Stair training;Gait training;DME Instruction;Functional mobility training;Therapeutic activities;Therapeutic exercise;Balance training;Patient/family education;Neuromuscular re-education;Manual techniques;Energy conservation;Taping    PT Next Visit Plan  BLE strengthening, graded activity    PT Home Exercise Plan  Seated and standing hip strengthening    Consulted and Agree with Plan of Care  Patient       Patient will benefit from skilled therapeutic intervention in order to improve the following deficits and impairments:  Abnormal gait, Decreased balance, Decreased endurance, Decreased mobility, Difficulty walking, Hypomobility, Pain,  Postural dysfunction, Decreased strength, Decreased activity tolerance, Decreased range of motion, Improper body mechanics  Visit Diagnosis: Muscle weakness (generalized)  Difficulty in walking, not elsewhere classified  Repeated falls     Problem List Patient Active Problem List   Diagnosis Date Noted  . Olecranon bursitis of right elbow 12/17/2018  . Advanced care planning/counseling discussion 09/22/2018  . Weakness of both lower extremities 09/22/2018  . History of hepatitis 01/05/2018  . 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 NAFLD (nonalcoholic fatty liver disease) 09/30/2017  . Obesity, Class I, BMI 30.0-34.9 (see actual BMI) 09/18/2017  . Polyarthralgia 02/15/2017  . OSA (obstructive sleep apnea) 02/15/2017  . Partial epilepsy with impairment of consciousness ([place]) 01/29/2016  . Constipation 07/13/2015  . Low vitamin B12 level 05/27/2015  . Elevated PSA 05/27/2015  . Vitamin D deficiency 03/02/2015  . CKD stage 3 due to type 2 diabetes mellitus ([place])   . Other long term (current) drug therapy 03/12/2012  . Health maintenance examination 04/26/2011  . TOTAL KNEE REPLACEMENT, LEFT, HX OF 04/07/2010  . Controlled type 2 diabetes mellitus with diabetic nephropathy ([place]) 03/30/2010  . Hyperlipidemia 03/30/2010  . 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 Essential hypertension, benign 03/30/2010   Myles Gip PT, DPT [phone] 05/16/2019, 2:08 PM  [facility] [address] [place], [place], [address] Phone: [phone]   Fax:  [phone]  Name: Kirk Bennett. MRN: [id number] Date of Birth: [birth year]

## 2019-05-20 ENCOUNTER — Encounter: Payer: Self-pay | Admitting: Physical Therapy

## 2019-05-20 ENCOUNTER — Other Ambulatory Visit: Payer: Self-pay

## 2019-05-20 ENCOUNTER — Ambulatory Visit: Payer: 59 | Admitting: Physical Therapy

## 2019-05-20 DIAGNOSIS — M6281 Muscle weakness (generalized): Secondary | ICD-10-CM

## 2019-05-20 DIAGNOSIS — R262 Difficulty in walking, not elsewhere classified: Secondary | ICD-10-CM

## 2019-05-20 DIAGNOSIS — R296 Repeated falls: Secondary | ICD-10-CM

## 2019-05-20 NOTE — Therapy (Signed)
[facility] [address]. [place], [place], [address] Phone: [phone]   Fax:  [phone]  Physical Therapy Treatment  Patient Details  Name: Kirk Bennett. MRN: [id number] Date of Birth: 05-17-1951 Referring Provider (PT): Driscilla Moats, MD   Encounter Date: 05/20/2019  PT End of Session - 05/20/19 1243    Visit Number  5    Number of Visits  9    Date for PT Re-Evaluation  06/05/19    PT Start Time  1247    PT Stop Time  1332    PT Time Calculation (min)  45 min    Equipment Utilized During Treatment  Gait belt    Activity Tolerance  Patient tolerated treatment well    Behavior During Therapy  WFL for tasks assessed/performed       Past Medical History:  Diagnosis Date  . Cataracts, bilateral 02/2011   and suspected glaucoma, to return for f/u, no diabetic retinopathy  . CKD stage 3 due to type 2 diabetes mellitus ([place]) 2015   normal renal US, self referred to Dr Holley Raring  . 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 Mora Bellman at [facility])  . Depression    found by neuro  . Elevated PSA 05/27/2015   Serial monitoring ([place])   . Glaucoma 2015   suspect  . History of hepatitis A   . HLD (hyperlipidemia)   . HTN (hypertension)   . Knee pain    s/p replacement  . SVT (supraventricular tachycardia) ([place]) 1996  . Vitamin D deficiency 03/02/2015  . 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 Gabriel Carina endo --> 02/2016 decided to return to PCP for DM care    Past Surgical History:  Procedure Laterality Date  . CARDIAC CATHETERIZATION  July 2010   No blockages (Dr. Liliane Shi)  . COLONOSCOPY  09/16/2005   hyperplastic polyps, rpt due 10 yrs   . COLONOSCOPY WITH PROPOFOL N/A 12/11/2015   mult polyps, few TA, rpt 65yrs ([place])  . corrective surgery amblyopia  1957  .  Cystectomy or meningioma removal brain  1990   (unclear)  . Left knee surgery  1971   Torn ACL  . REPLACEMENT TOTAL KNEE  April 2011   Left [facility] Dr. Garald Balding)    There were no vitals filed for this visit.  Subjective Assessment - 05/20/19 1339    Subjective  Patient does not report any significant changes or falls since last session. He does note that he felt some hamstring soreness after last visit, but this didn't last more than a day.    Currently in Pain?  Yes    Pain Location  Knee    Pain Orientation  Left;Right        TREATMENT Therapeutic Exercise: NuStep, L1-3, seat 15, x5 min for warm-up and leg strengthening (discontinued 2/2 to knee pain) Nautilus posterior postural strengthening for improved control of center of mass:  #30 narrow rows 2x12, #40 1x12  #30 wide rows 2x12, #40 1x12  #40 lat pull down 3x12  #30 chest press wide 2x12  #30 chest press narrow 2x12 5# CW, lateral walking, x3 laps 5# CW, monster walking, x3 laps Patient requiring increased seated rest breaks during the session to recover energy.  Patient educated throughout session on appropriate technique and form using multi-modal cueing, HEP, and activity  modification. Patient articulated understanding and returned demonstration. Patient educated on IAP and importance of exhalation during exertion to manage pressure system and prevent furthering of DRAM/hernia.  Patient Response to interventions: Patient reporting 1/10 on energy remaining at end of session.  ASSESSMENT Patient presents to clinic with excellent motivation to participate in therapy. Patient demonstrates deficits in BLE strength, pain, function, balance, gait, and posture. Patient able to perform posterior strengthening for improved postural control for increased repetitions during today's session and responded positively to active interventions. Patient will benefit from continued skilled therapeutic intervention to address remaining  deficits in BLE strength, pain, function, balance, gait, and posture in order to decrease risk of fall, increase function, and improve overall QOL.     PT Long Term Goals - 05/08/19 [id number]      PT LONG TERM GOAL #1   Title  Patient will be independent with HEP in order to improve strength and balance in order to decrease fall risk and improve function at home and work.    Baseline  IE: not initiated    Time  4    Period  Weeks    Status  New    Target Date  06/05/19      PT LONG TERM GOAL #2   Title  Patient will improve ABC by at least 13% in order to demonstrate clinically significant improvement in balance confidence.    Baseline  IE: 69.69%    Time  4    Period  Weeks    Status  New    Target Date  06/05/19      PT LONG TERM GOAL #3   Title  Patient will decrease 5TSTS by at least 3 seconds and perform from a standard height chair in order to demonstrate clinically significant improvement in LE strength.    Baseline  IE: 14.3 sec from an elevated surface    Time  4    Period  Weeks    Status  New    Target Date  06/05/19      PT LONG TERM GOAL #4   Title  Patient will increase 6MWT by at least 29m (126ft) in order to demonstrate clinically significant improvement in cardiopulmonary endurance and community ambulation.    Baseline  IE: 624 feet    Time  4    Period  Weeks    Status  New    Target Date  06/05/19            Plan - 05/20/19 1243    Clinical Impression Statement  Patient presents to clinic with excellent motivation to participate in therapy. Patient demonstrates deficits in BLE strength, pain, function, balance, gait, and posture. Patient able to perform posterior strengthening for improved postural control for increased repetitions during today's session and responded positively to active interventions. Patient will benefit from continued skilled therapeutic intervention to address remaining deficits in BLE strength, pain, function, balance, gait, and  posture in order to decrease risk of fall, increase function, and improve overall QOL.    Personal Factors and Comorbidities  Age;Behavior Pattern;Comorbidity 3+;Fitness;Past/Current Experience;Time since onset of injury/illness/exacerbation    Comorbidities  CKD Stage 3, HLD, HTN, DM, hx of seizures, depression    Examination-Activity Limitations  Squat;Bend;Locomotion Level;Stairs;Bathing;Lift;Transfers;Carry    Examination-Participation Restrictions  Shop;Cleaning;Meal Prep;Yard Work;Community Activity;Interpersonal Relationship    Stability/Clinical Decision Making  Evolving/Moderate complexity    Rehab Potential  Fair    PT Frequency  2x / week    PT Duration  4 weeks    PT Treatment/Interventions  ADLs/Self Care Home Management;Aquatic Therapy;Cryotherapy;Moist Heat;Stair training;Gait training;DME Instruction;Functional mobility training;Therapeutic activities;Therapeutic exercise;Balance training;Patient/family education;Neuromuscular re-education;Manual techniques;Energy conservation;Taping    PT Next Visit Plan  BLE strengthening, graded activity    PT Home Exercise Plan  Seated and standing hip strengthening    Consulted and Agree with Plan of Care  Patient       Patient will benefit from skilled therapeutic intervention in order to improve the following deficits and impairments:  Abnormal gait, Decreased balance, Decreased endurance, Decreased mobility, Difficulty walking, Hypomobility, Pain, Postural dysfunction, Decreased strength, Decreased activity tolerance, Decreased range of motion, Improper body mechanics  Visit Diagnosis: Muscle weakness (generalized)  Difficulty in walking, not elsewhere classified  Repeated falls     Problem List Patient Active Problem List   Diagnosis Date Noted  . Olecranon bursitis of right elbow 12/17/2018  . Advanced care planning/counseling discussion 09/22/2018  . Weakness of both lower extremities 09/22/2018  . History of hepatitis  01/05/2018  . 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 NAFLD (nonalcoholic fatty liver disease) 09/30/2017  . Obesity, Class I, BMI 30.0-34.9 (see actual BMI) 09/18/2017  . Polyarthralgia 02/15/2017  . OSA (obstructive sleep apnea) 02/15/2017  . Partial epilepsy with impairment of consciousness ([place]) 01/29/2016  . Constipation 07/13/2015  . Low vitamin B12 level 05/27/2015  . Elevated PSA 05/27/2015  . Vitamin D deficiency 03/02/2015  . CKD stage 3 due to type 2 diabetes mellitus ([place])   . Other long term (current) drug therapy 03/12/2012  . Health maintenance examination 04/26/2011  . TOTAL KNEE REPLACEMENT, LEFT, HX OF 04/07/2010  . Controlled type 2 diabetes mellitus with diabetic nephropathy ([place]) 03/30/2010  . Hyperlipidemia 03/30/2010  . 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 Essential hypertension, benign 03/30/2010   Myles Gip PT, DPT [phone] 05/20/2019, 2:28 PM  [facility] [address] [place], [place], [address] Phone: [phone]   Fax:  [phone]  Name: [name] MRN: [id number] Date of Birth: March 16, 1951

## 2019-05-23 ENCOUNTER — Ambulatory Visit: Payer: 59 | Attending: Nephrology | Admitting: Physical Therapy

## 2019-05-23 ENCOUNTER — Encounter: Payer: Self-pay | Admitting: Physical Therapy

## 2019-05-23 ENCOUNTER — Other Ambulatory Visit: Payer: Self-pay

## 2019-05-23 DIAGNOSIS — R296 Repeated falls: Secondary | ICD-10-CM | POA: Diagnosis present

## 2019-05-23 DIAGNOSIS — R262 Difficulty in walking, not elsewhere classified: Secondary | ICD-10-CM | POA: Diagnosis present

## 2019-05-23 DIAGNOSIS — M6281 Muscle weakness (generalized): Secondary | ICD-10-CM | POA: Diagnosis present

## 2019-05-23 NOTE — Therapy (Signed)
[facility] [address] [place], [place], [address] Phone: [phone]   Fax:  [phone]  Physical Therapy Treatment  Patient Details  Name: Kirk Bennett. MRN: [id number] Date of Birth: 09-Jun-1951 Referring Provider (PT): Driscilla Moats, MD   Encounter Date: 05/23/2019  PT End of Session - 05/23/19 1638    Visit Number  6    Number of Visits  9    Date for PT Re-Evaluation  06/05/19    PT Start Time  [id number]    PT Stop Time  1425    PT Time Calculation (min)  40 min    Equipment Utilized During Treatment  Gait belt    Activity Tolerance  Patient tolerated treatment well    Behavior During Therapy  WFL for tasks assessed/performed       Past Medical History:  Diagnosis Date  . Cataracts, bilateral 02/2011   and suspected glaucoma, to return for f/u, no diabetic retinopathy  . CKD stage 3 due to type 2 diabetes mellitus ([place]) 2015   normal renal US, self referred to Dr Holley Raring  . 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 Mora Bellman at [facility])  . Depression    found by neuro  . Elevated PSA 05/27/2015   Serial monitoring ([place])   . Glaucoma 2015   suspect  . History of hepatitis A   . HLD (hyperlipidemia)   . HTN (hypertension)   . Knee pain    s/p replacement  . SVT (supraventricular tachycardia) ([place]) 1996  . Vitamin D deficiency 03/02/2015  . 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 Gabriel Carina endo --> 02/2016 decided to return to PCP for DM care    Past Surgical History:  Procedure Laterality Date  . CARDIAC CATHETERIZATION  July 2010   No blockages (Dr. Liliane Shi)  . COLONOSCOPY  09/16/2005   hyperplastic polyps, rpt due 10 yrs   . COLONOSCOPY WITH PROPOFOL N/A 12/11/2015   mult polyps, few TA, rpt 57yrs ([place])  . corrective surgery amblyopia  1957  .  Cystectomy or meningioma removal brain  1990   (unclear)  . Left knee surgery  1971   Torn ACL  . REPLACEMENT TOTAL KNEE  April 2011   Left [facility] Dr. Garald Balding)    There were no vitals filed for this visit.  Subjective Assessment - 05/23/19 1635    Subjective  Patient states that he feels "pretty good" today. Patient does not report any falls since his last session.    Currently in Pain?  No/denies       TREATMENT Therapeutic Exercise: NuStep, L3, seat 15, x7 min for warm-up and leg strengthening Nautilus posterior postural strengthening for improved control of center of mass:  #30 narrow rows 2x15  #30 wide rows 2x15,   #30 lat pull down 2x15 Sit to Stand, one foam elevation of seated surface, no UE support 2x10, 2x10 with 5# dumbbell. 1 posterior LOB. VCs for improved whole foot contact and force transfer perpendicular to floor Hip Hinge patterning with dowel feedback for improved postural awareness and control of CoM.   Patient requiring increased seated rest breaks during the session to recover energy.  Patient educated throughout session on appropriate technique and form using multi-modal cueing, HEP, and activity modification. Patient articulated understanding and returned demonstration. Patient educated on IAP and importance  of exhalation during exertion to manage pressure system and prevent furthering of DRAM/hernia.  Patient Response to interventions: Patient smiling at progress with STS.  ASSESSMENT Patient presents to clinic with excellent motivation to participate in therapy. Patient demonstrates deficits in BLE strength, pain, function, balance, gait, and posture. Patient able to perform increased repetitions of STS without UE support and with the addition of handweight during today's session and responded positively to active interventions. Patient will benefit from continued skilled therapeutic intervention to address remaining deficits in BLE strength, pain, function,  balance, gait, and posture in order to decrease risk of fall, increase function, and improve overall QOL.       PT Long Term Goals - 05/08/19 [id number]      PT LONG TERM GOAL #1   Title  Patient will be independent with HEP in order to improve strength and balance in order to decrease fall risk and improve function at home and work.    Baseline  IE: not initiated    Time  4    Period  Weeks    Status  New    Target Date  06/05/19      PT LONG TERM GOAL #2   Title  Patient will improve ABC by at least 13% in order to demonstrate clinically significant improvement in balance confidence.    Baseline  IE: 69.69%    Time  4    Period  Weeks    Status  New    Target Date  06/05/19      PT LONG TERM GOAL #3   Title  Patient will decrease 5TSTS by at least 3 seconds and perform from a standard height chair in order to demonstrate clinically significant improvement in LE strength.    Baseline  IE: 14.3 sec from an elevated surface    Time  4    Period  Weeks    Status  New    Target Date  06/05/19      PT LONG TERM GOAL #4   Title  Patient will increase 6MWT by at least 35m (162ft) in order to demonstrate clinically significant improvement in cardiopulmonary endurance and community ambulation.    Baseline  IE: 624 feet    Time  4    Period  Weeks    Status  New    Target Date  06/05/19            Plan - 05/23/19 1638    Clinical Impression Statement  Patient presents to clinic with excellent motivation to participate in therapy. Patient demonstrates deficits in BLE strength, pain, function, balance, gait, and posture. Patient able to perform increased repetitions of STS without UE support and with the addition of handweight during today's session and responded positively to active interventions. Patient will benefit from continued skilled therapeutic intervention to address remaining deficits in BLE strength, pain, function, balance, gait, and posture in order to decrease risk of  fall, increase function, and improve overall QOL.    Personal Factors and Comorbidities  Age;Behavior Pattern;Comorbidity 3+;Fitness;Past/Current Experience;Time since onset of injury/illness/exacerbation    Comorbidities  CKD Stage 3, HLD, HTN, DM, hx of seizures, depression    Examination-Activity Limitations  Squat;Bend;Locomotion Level;Stairs;Bathing;Lift;Transfers;Carry    Examination-Participation Restrictions  Shop;Cleaning;Meal Prep;Yard Work;Community Activity;Interpersonal Relationship    Stability/Clinical Decision Making  Evolving/Moderate complexity    Rehab Potential  Fair    PT Frequency  2x / week    PT Duration  4 weeks    PT Treatment/Interventions  ADLs/Self Care Home Management;Aquatic Therapy;Cryotherapy;Moist Heat;Stair training;Gait training;DME Instruction;Functional mobility training;Therapeutic activities;Therapeutic exercise;Balance training;Patient/family education;Neuromuscular re-education;Manual techniques;Energy conservation;Taping    PT Next Visit Plan  BLE strengthening, graded activity    PT Home Exercise Plan  Seated and standing hip strengthening    Consulted and Agree with Plan of Care  Patient       Patient will benefit from skilled therapeutic intervention in order to improve the following deficits and impairments:  Abnormal gait, Decreased balance, Decreased endurance, Decreased mobility, Difficulty walking, Hypomobility, Pain, Postural dysfunction, Decreased strength, Decreased activity tolerance, Decreased range of motion, Improper body mechanics  Visit Diagnosis: Muscle weakness (generalized)  Difficulty in walking, not elsewhere classified  Repeated falls     Problem List Patient Active Problem List   Diagnosis Date Noted  . Olecranon bursitis of right elbow 12/17/2018  . Advanced care planning/counseling discussion 09/22/2018  . Weakness of both lower extremities 09/22/2018  . History of hepatitis 01/05/2018  . LAFB (left anterior  fascicular block) 01/05/2018  . NAFLD (nonalcoholic fatty liver disease) 09/30/2017  . Obesity, Class I, BMI 30.0-34.9 (see actual BMI) 09/18/2017  . Polyarthralgia 02/15/2017  . OSA (obstructive sleep apnea) 02/15/2017  . Partial epilepsy with impairment of consciousness ([place]) 01/29/2016  . Constipation 07/13/2015  . Low vitamin B12 level 05/27/2015  . Elevated PSA 05/27/2015  . Vitamin D deficiency 03/02/2015  . CKD stage 3 due to type 2 diabetes mellitus ([place])   . Other long term (current) drug therapy 03/12/2012  . Health maintenance examination 04/26/2011  . TOTAL KNEE REPLACEMENT, LEFT, HX OF 04/07/2010  . Controlled type 2 diabetes mellitus with diabetic nephropathy ([place]) 03/30/2010  . Hyperlipidemia 03/30/2010  . 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 Essential hypertension, benign 03/30/2010   Myles Gip PT, DPT [phone] 05/23/2019, 4:46 PM  [facility] [address] [place], [place], [address] Phone: [phone]   Fax:  [phone]  Name: [name] MRN: [id number] Date of Birth: [birth year]

## 2019-05-27 ENCOUNTER — Ambulatory Visit: Payer: 59 | Admitting: Physical Therapy

## 2019-05-27 ENCOUNTER — Other Ambulatory Visit: Payer: Self-pay

## 2019-05-27 ENCOUNTER — Encounter: Payer: Self-pay | Admitting: Physical Therapy

## 2019-05-27 DIAGNOSIS — M6281 Muscle weakness (generalized): Secondary | ICD-10-CM | POA: Diagnosis not present

## 2019-05-27 DIAGNOSIS — R296 Repeated falls: Secondary | ICD-10-CM

## 2019-05-27 DIAGNOSIS — R262 Difficulty in walking, not elsewhere classified: Secondary | ICD-10-CM

## 2019-05-27 NOTE — Therapy (Signed)
[facility] [address]. [place], [place], [address] Phone: [phone]   Fax:  [phone]  Physical Therapy Treatment  Patient Details  Name: Kirk Bennett. MRN: [id number] Date of Birth: 1951/05/21 Referring Provider (PT): Driscilla Moats, MD   Encounter Date: 05/27/2019  PT End of Session - 05/27/19 1253    Visit Number  7    Number of Visits  9    Date for PT Re-Evaluation  06/05/19    PT Start Time  [id number]    PT Stop Time  1335    PT Time Calculation (min)  39 min    Equipment Utilized During Treatment  Gait belt    Activity Tolerance  Patient tolerated treatment well    Behavior During Therapy  WFL for tasks assessed/performed       Past Medical History:  Diagnosis Date  . Cataracts, bilateral 02/2011   and suspected glaucoma, to return for f/u, no diabetic retinopathy  . CKD stage 3 due to type 2 diabetes mellitus ([place]) 2015   normal renal US, self referred to Dr Holley Raring  . 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 Mora Bellman at [facility])  . Depression    found by neuro  . Elevated PSA 05/27/2015   Serial monitoring ([place])   . Glaucoma 2015   suspect  . History of hepatitis A   . HLD (hyperlipidemia)   . HTN (hypertension)   . Knee pain    s/p replacement  . SVT (supraventricular tachycardia) ([place]) 1996  . Vitamin D deficiency 03/02/2015  . 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 Gabriel Carina endo --> 02/2016 decided to return to PCP for DM care    Past Surgical History:  Procedure Laterality Date  . CARDIAC CATHETERIZATION  July 2010   No blockages (Dr. Liliane Shi)  . COLONOSCOPY  09/16/2005   hyperplastic polyps, rpt due 10 yrs   . COLONOSCOPY WITH PROPOFOL N/A 12/11/2015   mult polyps, few TA, rpt 69yrs ([place])  . corrective surgery amblyopia  1957  .  Cystectomy or meningioma removal brain  1990   (unclear)  . Left knee surgery  1971   Torn ACL  . REPLACEMENT TOTAL KNEE  April 2011   Left [facility] Dr. Garald Balding)    There were no vitals filed for this visit.  Subjective Assessment - 05/27/19 1453    Subjective  Patient presents to clinic in good spirits and denies any falls or concerns since his last visit. He does note some occasional chest pain/tightness at night. Denies any SOB, arm pain, changes in sensation, or other symptoms; feels it may be related to some of the exercises we have been doing.    Currently in Pain?  No/denies      TREATMENT Therapeutic Exercise: NuStep, L3, seat 15, x7 min for warm-up and leg strengthening Nautilus posterior postural strengthening for improved control of center of mass:  #30 narrow rows 2x15  #30 wide rows 2x15,   #30 lat pull down 2x15 Elevated stiff legged deadlift, 8#, 3x8 with VCs for hip initiation and glute set at top Nautilus resisted walking fwd/bwd, 40# x6  Doorway pec stretch, B x2 min hold, R 3x 30 sec  Patient requiring multiple seated rest breaks during the session to recover energy.  Patient educated throughout session on appropriate technique and form  using multi-modal cueing, HEP, and activity modification. Patient articulated understanding and returned demonstration.  Patient Response to interventions: Patient reporting 5/10 energy level at end of session.  ASSESSMENT Patient presents to clinic with excellent motivation to participate in therapy. Patient demonstrates deficits in BLE strength, pain, function, balance, gait, and posture. Patient able to perform modified deadlift with good form during today's session and responded positively to active interventions. Patient will benefit from continued skilled therapeutic intervention to address remaining deficits in BLE strength, pain, function, balance, gait, and posture in order to decrease risk of fall, increase function, and  improve overall QOL.     PT Long Term Goals - 05/08/19 [id number]      PT LONG TERM GOAL #1   Title  Patient will be independent with HEP in order to improve strength and balance in order to decrease fall risk and improve function at home and work.    Baseline  IE: not initiated    Time  4    Period  Weeks    Status  New    Target Date  06/05/19      PT LONG TERM GOAL #2   Title  Patient will improve ABC by at least 13% in order to demonstrate clinically significant improvement in balance confidence.    Baseline  IE: 69.69%    Time  4    Period  Weeks    Status  New    Target Date  06/05/19      PT LONG TERM GOAL #3   Title  Patient will decrease 5TSTS by at least 3 seconds and perform from a standard height chair in order to demonstrate clinically significant improvement in LE strength.    Baseline  IE: 14.3 sec from an elevated surface    Time  4    Period  Weeks    Status  New    Target Date  06/05/19      PT LONG TERM GOAL #4   Title  Patient will increase 6MWT by at least 28m (150ft) in order to demonstrate clinically significant improvement in cardiopulmonary endurance and community ambulation.    Baseline  IE: 624 feet    Time  4    Period  Weeks    Status  New    Target Date  06/05/19            Plan - 05/27/19 1254    Clinical Impression Statement  Patient presents to clinic with excellent motivation to participate in therapy. Patient demonstrates deficits in BLE strength, pain, function, balance, gait, and posture. Patient able to perform modified deadlift with good form during today's session and responded positively to active interventions. Patient will benefit from continued skilled therapeutic intervention to address remaining deficits in BLE strength, pain, function, balance, gait, and posture in order to decrease risk of fall, increase function, and improve overall QOL.    Personal Factors and Comorbidities  Age;Behavior Pattern;Comorbidity  3+;Fitness;Past/Current Experience;Time since onset of injury/illness/exacerbation    Comorbidities  CKD Stage 3, HLD, HTN, DM, hx of seizures, depression    Examination-Activity Limitations  Squat;Bend;Locomotion Level;Stairs;Bathing;Lift;Transfers;Carry    Examination-Participation Restrictions  Shop;Cleaning;Meal Prep;Yard Work;Community Activity;Interpersonal Relationship    Stability/Clinical Decision Making  Evolving/Moderate complexity    Rehab Potential  Fair    PT Frequency  2x / week    PT Duration  4 weeks    PT Treatment/Interventions  ADLs/Self Care Home Management;Aquatic Therapy;Cryotherapy;Moist Heat;Stair training;Gait training;DME Instruction;Functional mobility training;Therapeutic activities;Therapeutic exercise;Balance training;Patient/family  education;Neuromuscular re-education;Manual techniques;Energy conservation;Taping    PT Next Visit Plan  BLE strengthening, graded activity    PT Home Exercise Plan  Seated and standing hip strengthening    Consulted and Agree with Plan of Care  Patient       Patient will benefit from skilled therapeutic intervention in order to improve the following deficits and impairments:  Abnormal gait, Decreased balance, Decreased endurance, Decreased mobility, Difficulty walking, Hypomobility, Pain, Postural dysfunction, Decreased strength, Decreased activity tolerance, Decreased range of motion, Improper body mechanics  Visit Diagnosis: Muscle weakness (generalized)  Difficulty in walking, not elsewhere classified  Repeated falls     Problem List Patient Active Problem List   Diagnosis Date Noted  . Olecranon bursitis of right elbow 12/17/2018  . Advanced care planning/counseling discussion 09/22/2018  . Weakness of both lower extremities 09/22/2018  . History of hepatitis 01/05/2018  . 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 NAFLD (nonalcoholic fatty liver disease) 09/30/2017  . Obesity, Class I, BMI 30.0-34.9 (see  actual BMI) 09/18/2017  . Polyarthralgia 02/15/2017  . OSA (obstructive sleep apnea) 02/15/2017  . Partial epilepsy with impairment of consciousness ([place]) 01/29/2016  . Constipation 07/13/2015  . Low vitamin B12 level 05/27/2015  . Elevated PSA 05/27/2015  . Vitamin D deficiency 03/02/2015  . CKD stage 3 due to type 2 diabetes mellitus ([place])   . Other long term (current) drug therapy 03/12/2012  . Health maintenance examination 04/26/2011  . TOTAL KNEE REPLACEMENT, LEFT, HX OF 04/07/2010  . Controlled type 2 diabetes mellitus with diabetic nephropathy ([place]) 03/30/2010  . Hyperlipidemia 03/30/2010  . 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 Essential hypertension, benign 03/30/2010   Myles Gip PT, DPT [phone] 05/27/2019, 2:59 PM  [facility] [address] [place], [place], [address] Phone: [phone]   Fax:  [phone]  Name: [name] MRN: [id number] Date of Birth: 02-22-1951

## 2019-05-29 ENCOUNTER — Other Ambulatory Visit: Payer: Self-pay

## 2019-05-29 ENCOUNTER — Encounter: Payer: Self-pay | Admitting: Physical Therapy

## 2019-05-29 ENCOUNTER — Ambulatory Visit: Payer: 59 | Admitting: Physical Therapy

## 2019-05-29 DIAGNOSIS — M6281 Muscle weakness (generalized): Secondary | ICD-10-CM | POA: Diagnosis not present

## 2019-05-29 DIAGNOSIS — R262 Difficulty in walking, not elsewhere classified: Secondary | ICD-10-CM

## 2019-05-29 DIAGNOSIS — R296 Repeated falls: Secondary | ICD-10-CM

## 2019-05-29 NOTE — Therapy (Signed)
[facility] [address] [place], [place], [address] Phone: [phone]   Fax:  [phone]  Physical Therapy Treatment  Patient Details  Name: Kirk Bennett. MRN: [id number] Date of Birth: 01/22/1951 Referring Provider (PT): Driscilla Moats, MD   Encounter Date: 05/29/2019  PT End of Session - 05/29/19 1159    Visit Number  8    Number of Visits  9    Date for PT Re-Evaluation  06/05/19    PT Start Time  0945    PT Stop Time  1030    PT Time Calculation (min)  45 min    Equipment Utilized During Treatment  Gait belt    Activity Tolerance  Patient tolerated treatment well    Behavior During Therapy  WFL for tasks assessed/performed       Past Medical History:  Diagnosis Date  . Cataracts, bilateral 02/2011   and suspected glaucoma, to return for f/u, no diabetic retinopathy  . CKD stage 3 due to type 2 diabetes mellitus ([place]) 2015   normal renal US, self referred to Dr Holley Raring  . 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 Mora Bellman at [facility])  . Depression    found by neuro  . Elevated PSA 05/27/2015   Serial monitoring ([place])   . Glaucoma 2015   suspect  . History of hepatitis A   . HLD (hyperlipidemia)   . HTN (hypertension)   . Knee pain    s/p replacement  . SVT (supraventricular tachycardia) ([place]) 1996  . Vitamin D deficiency 03/02/2015  . 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 Gabriel Carina endo --> 02/2016 decided to return to PCP for DM care    Past Surgical History:  Procedure Laterality Date  . CARDIAC CATHETERIZATION  July 2010   No blockages (Dr. Liliane Shi)  . COLONOSCOPY  09/16/2005   hyperplastic polyps, rpt due 10 yrs   . COLONOSCOPY WITH PROPOFOL N/A 12/11/2015   mult polyps, few TA, rpt 66yrs ([place])  . corrective surgery amblyopia  1957  .  Cystectomy or meningioma removal brain  1990   (unclear)  . Left knee surgery  1971   Torn ACL  . REPLACEMENT TOTAL KNEE  April 2011   Left [facility] Dr. Garald Balding)    There were no vitals filed for this visit.  Subjective Assessment - 05/29/19 1156    Subjective  Patient reports that he has been having greater ease getting up from lower seating surfaces at home. He also reports that his R shoulder has been fairly sore lately and waking him up at night, but he denies any pain right now.    Currently in Pain?  No/denies      TREATMENT Therapeutic Exercise: NuStep, L2, seat 15, x10 min for warm-up and leg strengthening Nautilus fwd/bwd walking #40 x5 for postural strengthening  Sit to Stand, one foam elevation of seated surface, no UE support 1x12, 1x10 and 1x8 with 5# dumbbell.  Lateral walking 20 ft, BLE, 5# CW x5  to improve hip strength with VCs for tall posture throughout to promote greater hip extension  Patient requiring increased seated rest breaks during the session to recover.  Patient educated throughout session on appropriate technique and form using multi-modal cueing, HEP, and activity modification.   Patient Response to interventions: Patient 5/10 energy level at end  of session.  ASSESSMENT Patient presents to clinic with excellent motivation to participate in therapy. Patient demonstrates deficits in BLE strength, pain, function, balance, gait, and posture. Patient able to perform all hip exercises with greater hip extension/more upright posture during today's session and responded positively to active interventions. Patient will benefit from continued skilled therapeutic intervention to address remaining deficits in BLE strength, pain, function, balance, gait, and posture in order to decrease risk of fall, increase function, and improve overall QOL.       PT Long Term Goals - 05/08/19 [id number]      PT LONG TERM GOAL #1   Title  Patient will be independent with HEP in  order to improve strength and balance in order to decrease fall risk and improve function at home and work.    Baseline  IE: not initiated    Time  4    Period  Weeks    Status  New    Target Date  06/05/19      PT LONG TERM GOAL #2   Title  Patient will improve ABC by at least 13% in order to demonstrate clinically significant improvement in balance confidence.    Baseline  IE: 69.69%    Time  4    Period  Weeks    Status  New    Target Date  06/05/19      PT LONG TERM GOAL #3   Title  Patient will decrease 5TSTS by at least 3 seconds and perform from a standard height chair in order to demonstrate clinically significant improvement in LE strength.    Baseline  IE: 14.3 sec from an elevated surface    Time  4    Period  Weeks    Status  New    Target Date  06/05/19      PT LONG TERM GOAL #4   Title  Patient will increase 6MWT by at least 47m (175ft) in order to demonstrate clinically significant improvement in cardiopulmonary endurance and community ambulation.    Baseline  IE: 624 feet    Time  4    Period  Weeks    Status  New    Target Date  06/05/19            Plan - 05/29/19 1200    Clinical Impression Statement  Patient presents to clinic with excellent motivation to participate in therapy. Patient demonstrates deficits in BLE strength, pain, function, balance, gait, and posture. Patient able to perform all hip exercises with greater hip extension/more upright posture during today's session and responded positively to active interventions. Patient will benefit from continued skilled therapeutic intervention to address remaining deficits in BLE strength, pain, function, balance, gait, and posture in order to decrease risk of fall, increase function, and improve overall QOL.    Personal Factors and Comorbidities  Age;Behavior Pattern;Comorbidity 3+;Fitness;Past/Current Experience;Time since onset of injury/illness/exacerbation    Comorbidities  CKD Stage 3, HLD, HTN, DM,  hx of seizures, depression    Examination-Activity Limitations  Squat;Bend;Locomotion Level;Stairs;Bathing;Lift;Transfers;Carry    Examination-Participation Restrictions  Shop;Cleaning;Meal Prep;Yard Work;Community Activity;Interpersonal Relationship    Stability/Clinical Decision Making  Evolving/Moderate complexity    Rehab Potential  Fair    PT Frequency  2x / week    PT Duration  4 weeks    PT Treatment/Interventions  ADLs/Self Care Home Management;Aquatic Therapy;Cryotherapy;Moist Heat;Stair training;Gait training;DME Instruction;Functional mobility training;Therapeutic activities;Therapeutic exercise;Balance training;Patient/family education;Neuromuscular re-education;Manual techniques;Energy conservation;Taping    PT Next Visit Plan  BLE strengthening, graded  activity    PT Home Exercise Plan  Seated and standing hip strengthening    Consulted and Agree with Plan of Care  Patient       Patient will benefit from skilled therapeutic intervention in order to improve the following deficits and impairments:  Abnormal gait, Decreased balance, Decreased endurance, Decreased mobility, Difficulty walking, Hypomobility, Pain, Postural dysfunction, Decreased strength, Decreased activity tolerance, Decreased range of motion, Improper body mechanics  Visit Diagnosis: Muscle weakness (generalized)  Difficulty in walking, not elsewhere classified  Repeated falls     Problem List Patient Active Problem List   Diagnosis Date Noted  . Olecranon bursitis of right elbow 12/17/2018  . Advanced care planning/counseling discussion 09/22/2018  . Weakness of both lower extremities 09/22/2018  . History of hepatitis 01/05/2018  . 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 NAFLD (nonalcoholic fatty liver disease) 09/30/2017  . Obesity, Class I, BMI 30.0-34.9 (see actual BMI) 09/18/2017  . Polyarthralgia 02/15/2017  . OSA (obstructive sleep apnea) 02/15/2017  . Partial epilepsy with  impairment of consciousness ([place]) 01/29/2016  . Constipation 07/13/2015  . Low vitamin B12 level 05/27/2015  . Elevated PSA 05/27/2015  . Vitamin D deficiency 03/02/2015  . CKD stage 3 due to type 2 diabetes mellitus ([place])   . Other long term (current) drug therapy 03/12/2012  . Health maintenance examination 04/26/2011  . TOTAL KNEE REPLACEMENT, LEFT, HX OF 04/07/2010  . Controlled type 2 diabetes mellitus with diabetic nephropathy ([place]) 03/30/2010  . Hyperlipidemia 03/30/2010  . 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 Essential hypertension, benign 03/30/2010   Myles Gip PT, DPT [phone] 05/29/2019, 12:08 PM  [facility] [address] [place], [place], [address] Phone: [phone]   Fax:  [phone]  Name: Kirk Bennett. MRN: [id number] Date of Birth: 1950/10/21

## 2019-06-03 ENCOUNTER — Ambulatory Visit: Payer: 59 | Admitting: Physical Therapy

## 2019-06-03 ENCOUNTER — Encounter: Payer: Self-pay | Admitting: Physical Therapy

## 2019-06-03 ENCOUNTER — Other Ambulatory Visit: Payer: Self-pay

## 2019-06-03 DIAGNOSIS — M6281 Muscle weakness (generalized): Secondary | ICD-10-CM | POA: Diagnosis not present

## 2019-06-03 DIAGNOSIS — R262 Difficulty in walking, not elsewhere classified: Secondary | ICD-10-CM

## 2019-06-03 DIAGNOSIS — R296 Repeated falls: Secondary | ICD-10-CM

## 2019-06-03 NOTE — Therapy (Signed)
[facility] [address]. [place], [place], [address] Phone: [phone]   Fax:  [phone]  Physical Therapy Treatment  Patient Details  Name: Kirk Bennett. MRN: [id number] Date of Birth: 1950/09/17 Referring Provider (PT): Driscilla Moats, MD   Encounter Date: 06/03/2019  PT End of Session - 06/03/19 1257    Visit Number  9    Number of Visits  9    Date for PT Re-Evaluation  06/05/19    PT Start Time  1300    PT Stop Time  1340    PT Time Calculation (min)  40 min    Equipment Utilized During Treatment  Gait belt    Activity Tolerance  Patient tolerated treatment well    Behavior During Therapy  WFL for tasks assessed/performed       Past Medical History:  Diagnosis Date  . Cataracts, bilateral 02/2011   and suspected glaucoma, to return for f/u, no diabetic retinopathy  . CKD stage 3 due to type 2 diabetes mellitus ([place]) 2015   normal renal US, self referred to Dr Holley Raring  . 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 Mora Bellman at [facility])  . Depression    found by neuro  . Elevated PSA 05/27/2015   Serial monitoring ([place])   . Glaucoma 2015   suspect  . History of hepatitis A   . HLD (hyperlipidemia)   . HTN (hypertension)   . Knee pain    s/p replacement  . SVT (supraventricular tachycardia) ([place]) 1996  . Vitamin D deficiency 03/02/2015  . 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 Gabriel Carina endo --> 02/2016 decided to return to PCP for DM care    Past Surgical History:  Procedure Laterality Date  . CARDIAC CATHETERIZATION  July 2010   No blockages (Dr. Liliane Shi)  . COLONOSCOPY  09/16/2005   hyperplastic polyps, rpt due 10 yrs   . COLONOSCOPY WITH PROPOFOL N/A 12/11/2015   mult polyps, few TA, rpt 65yrs ([place])  . corrective surgery amblyopia  1957  .  Cystectomy or meningioma removal brain  1990   (unclear)  . Left knee surgery  1971   Torn ACL  . REPLACEMENT TOTAL KNEE  April 2011   Left [facility] Dr. Garald Balding)    There were no vitals filed for this visit.  Subjective Assessment - 06/03/19 1257    Subjective  Patient states that he had a nice weekend. He notes no falls since his last visit and continues to get up from the waiting area with less effort.    Currently in Pain?  No/denies      TREATMENT Therapeutic Exercise: NuStep, L3, seat 15, x7 min for warm-up and leg strengthening B LAQ x12, 5# CW Nautilus fwd, bwd, lateral walking #50 x4 each for postural strengthening Sit to Stand, one foam elevation of seated surface, no UE support 1x8, 2 x10 with 5# dumbbell Hip extension, B, #4 CW, 2x10 Calf raises, B, #4 CW 2x10  Patient educated throughout session on appropriate technique and form using multi-modal cueing, HEP, and activity modification.   Patient Response to interventions: Patient 4/10 energy level at end of session.  ASSESSMENT Patient presents to clinic with excellent motivation to participate in therapy. Patient demonstrates deficits in BLE strength, pain, function, balance, gait, and posture. Patient demonstrating limited tolerance to resisted  walking during today's session but responded positively to all other active interventions. Patient will benefit from continued skilled therapeutic intervention to address remaining deficits in BLE strength, pain, function, balance, gait, and posture in order to decrease risk of fall, increase function, and improve overall QOL.      PT Long Term Goals - 05/08/19 [id number]      PT LONG TERM GOAL #1   Title  Patient will be independent with HEP in order to improve strength and balance in order to decrease fall risk and improve function at home and work.    Baseline  IE: not initiated    Time  4    Period  Weeks    Status  New    Target Date  06/05/19      PT LONG TERM GOAL #2    Title  Patient will improve ABC by at least 13% in order to demonstrate clinically significant improvement in balance confidence.    Baseline  IE: 69.69%    Time  4    Period  Weeks    Status  New    Target Date  06/05/19      PT LONG TERM GOAL #3   Title  Patient will decrease 5TSTS by at least 3 seconds and perform from a standard height chair in order to demonstrate clinically significant improvement in LE strength.    Baseline  IE: 14.3 sec from an elevated surface    Time  4    Period  Weeks    Status  New    Target Date  06/05/19      PT LONG TERM GOAL #4   Title  Patient will increase 6MWT by at least 66m (131ft) in order to demonstrate clinically significant improvement in cardiopulmonary endurance and community ambulation.    Baseline  IE: 624 feet    Time  4    Period  Weeks    Status  New    Target Date  06/05/19            Plan - 06/03/19 1258    Personal Factors and Comorbidities  Age;Behavior Pattern;Comorbidity 3+;Fitness;Past/Current Experience;Time since onset of injury/illness/exacerbation    Comorbidities  CKD Stage 3, HLD, HTN, DM, hx of seizures, depression    Examination-Activity Limitations  Squat;Bend;Locomotion Level;Stairs;Bathing;Lift;Transfers;Carry    Examination-Participation Restrictions  Shop;Cleaning;Meal Prep;Yard Work;Community Activity;Interpersonal Relationship    Stability/Clinical Decision Making  Evolving/Moderate complexity    Rehab Potential  Fair    PT Frequency  2x / week    PT Duration  4 weeks    PT Treatment/Interventions  ADLs/Self Care Home Management;Aquatic Therapy;Cryotherapy;Moist Heat;Stair training;Gait training;DME Instruction;Functional mobility training;Therapeutic activities;Therapeutic exercise;Balance training;Patient/family education;Neuromuscular re-education;Manual techniques;Energy conservation;Taping    PT Next Visit Plan  BLE strengthening, graded activity    PT Home Exercise Plan  Seated and standing hip  strengthening    Consulted and Agree with Plan of Care  Patient       Patient will benefit from skilled therapeutic intervention in order to improve the following deficits and impairments:  Abnormal gait, Decreased balance, Decreased endurance, Decreased mobility, Difficulty walking, Hypomobility, Pain, Postural dysfunction, Decreased strength, Decreased activity tolerance, Decreased range of motion, Improper body mechanics  Visit Diagnosis: Muscle weakness (generalized)  Difficulty in walking, not elsewhere classified  Repeated falls     Problem List Patient Active Problem List   Diagnosis Date Noted  . Olecranon bursitis of right elbow 12/17/2018  . Advanced care planning/counseling discussion 09/22/2018  . Weakness  of both lower extremities 09/22/2018  . History of hepatitis 01/05/2018  . 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 NAFLD (nonalcoholic fatty liver disease) 09/30/2017  . Obesity, Class I, BMI 30.0-34.9 (see actual BMI) 09/18/2017  . Polyarthralgia 02/15/2017  . OSA (obstructive sleep apnea) 02/15/2017  . Partial epilepsy with impairment of consciousness ([place]) 01/29/2016  . Constipation 07/13/2015  . Low vitamin B12 level 05/27/2015  . Elevated PSA 05/27/2015  . Vitamin D deficiency 03/02/2015  . CKD stage 3 due to type 2 diabetes mellitus ([place])   . Other long term (current) drug therapy 03/12/2012  . Health maintenance examination 04/26/2011  . TOTAL KNEE REPLACEMENT, LEFT, HX OF 04/07/2010  . Controlled type 2 diabetes mellitus with diabetic nephropathy ([place]) 03/30/2010  . Hyperlipidemia 03/30/2010  . 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 Essential hypertension, benign 03/30/2010    Louie Casa 06/03/2019, 3:18 PM  [facility] [address] [place], [place], [address] Phone: [phone]   Fax:  [phone]  Name: [name] MRN:  [id number] Date of Birth: 1950/08/30

## 2019-06-05 ENCOUNTER — Other Ambulatory Visit: Payer: Self-pay

## 2019-06-05 ENCOUNTER — Encounter: Payer: Self-pay | Admitting: Physical Therapy

## 2019-06-05 ENCOUNTER — Ambulatory Visit: Payer: 59 | Admitting: Physical Therapy

## 2019-06-05 DIAGNOSIS — R296 Repeated falls: Secondary | ICD-10-CM

## 2019-06-05 DIAGNOSIS — R262 Difficulty in walking, not elsewhere classified: Secondary | ICD-10-CM

## 2019-06-05 DIAGNOSIS — M6281 Muscle weakness (generalized): Secondary | ICD-10-CM

## 2019-06-05 NOTE — Therapy (Signed)
[facility] [address]. [place], [place], [address] Phone: [phone]   Fax:  [phone]  Physical Therapy Treatment Physical Therapy Progress Note   Dates of reporting period  05/07/2019   to   06/05/2019   Patient Details  Name: Kirk Bennett. MRN: [id number] Date of Birth: 10-07-1950 Referring Provider (PT): Driscilla Moats, MD   Encounter Date: 06/05/2019  PT End of Session - 06/05/19 1007    Visit Number  10    Number of Visits  21    Date for PT Re-Evaluation  06/05/19    Authorization Type  PN 06/05/2019    PT Start Time  1000    PT Stop Time  1043    PT Time Calculation (min)  43 min    Equipment Utilized During Treatment  Gait belt    Activity Tolerance  Patient tolerated treatment well    Behavior During Therapy  WFL for tasks assessed/performed       Past Medical History:  Diagnosis Date  . Cataracts, bilateral 02/2011   and suspected glaucoma, to return for f/u, no diabetic retinopathy  . CKD stage 3 due to type 2 diabetes mellitus ([place]) 2015   normal renal US, self referred to Dr Holley Raring  . 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 Mora Bellman at [facility])  . Depression    found by neuro  . Elevated PSA 05/27/2015   Serial monitoring ([place])   . Glaucoma 2015   suspect  . History of hepatitis A   . HLD (hyperlipidemia)   . HTN (hypertension)   . Knee pain    s/p replacement  . SVT (supraventricular tachycardia) ([place]) 1996  . Vitamin D deficiency 03/02/2015  . 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 Gabriel Carina endo --> 02/2016 decided to return to PCP for DM care    Past Surgical History:  Procedure Laterality Date  . CARDIAC CATHETERIZATION  July 2010   No blockages (Dr. Liliane Shi)  . COLONOSCOPY  09/16/2005   hyperplastic polyps, rpt due 10 yrs   .  COLONOSCOPY WITH PROPOFOL N/A 12/11/2015   mult polyps, few TA, rpt 6yr ([place])  . corrective surgery amblyopia  1957  . Cystectomy or meningioma removal brain  1990   (unclear)  . Left knee surgery  1971   Torn ACL  . REPLACEMENT TOTAL KNEE  April 2011   [facility]    There were no vitals filed for this visit.  Subjective Assessment - 06/05/19 1003    Subjective  Patient reports that he is feeling tired this morning but denies any knee pain. Patient reports no falls since last visit.    Currently in Pain?  No/denies       TREATMENT Therapeutic Exercise: NuStep, L3, seat 15, x8.5 min for warm-up and leg strengthening Reassessed goals; see below. Sit to Stand, one foam elevation of seated surface, no UE support 2x5 Hip extension, B, #4 CW, 2x12 Calf raises, B, #4 CW 2x12 LAQ, B, #4 CW, 2x12  Patient educated throughout session on appropriate technique and form using multi-modal cueing, HEP, and activity modification.   Patient Response to interventions: Patient reporting less hip pain than during 6MWT.  ASSESSMENT Patient presents to clinic with excellent motivation to participate in therapy. Patient demonstrates deficits in BLE strength, pain, function, balance,  gait, and posture. Patient demonstrating improvements with respect to treatment goals, most notably his ability to get up from a lower surface. He continues to respond positively to active interventions. Patient's condition has the potential to improve in response to therapy. Maximum improvement is yet to be obtained. The anticipated improvement is attainable and reasonable in a generally predictable time. Patient will benefit from continued skilled therapeutic intervention to address remaining deficits in BLE strength, pain, function, balance, gait, and posture in order to decrease risk of fall, increase function, and improve overall QOL.       PT Long Term Goals - 06/05/19 1009      PT LONG TERM  GOAL #1   Title  Patient will be independent with HEP in order to improve strength and balance in order to decrease fall risk and improve function at home and work.    Baseline  IE: not initiated    Time  4    Period  Weeks    Status  Achieved      PT LONG TERM GOAL #2   Title  Patient will improve ABC by at least 13% in order to demonstrate clinically significant improvement in balance confidence.    Baseline  IE: 69.69%; 10/14: 80%    Time  6    Period  Weeks    Target Date  07/17/19      PT LONG TERM GOAL #3   Title  Patient will decrease 5TSTS by at least 3 seconds and perform from a standard height chair in order to demonstrate clinically significant improvement in LE strength.    Baseline  IE: 14.3 sec from an elevated surface (2 foam pads); 10/14: 15.8 sec from elevated surface (1 foam pad)    Time  6    Period  Weeks    Status  Partially Met    Target Date  07/17/19      PT LONG TERM GOAL #4   Title  Patient will increase 6MWT by at least 69m(1652f in order to demonstrate clinically significant improvement in cardiopulmonary endurance and community ambulation.    Baseline  IE: 624 feet; 10/14: 840 feet    Time  6    Period  Weeks    Status  Partially Met    Target Date  07/17/19            Plan - 06/05/19 1226    Clinical Impression Statement  Patient presents to clinic with excellent motivation to participate in therapy. Patient demonstrates deficits in BLE strength, pain, function, balance, gait, and posture. Patient demonstrating improvements with respect to treatment goals, most notably his ability to get up from a lower surface. He continues to respond positively to active interventions. Patient's condition has the potential to improve in response to therapy. Maximum improvement is yet to be obtained. The anticipated improvement is attainable and reasonable in a generally predictable time. Patient will benefit from continued skilled therapeutic intervention to  address remaining deficits in BLE strength, pain, function, balance, gait, and posture in order to decrease risk of fall, increase function, and improve overall QOL.    Personal Factors and Comorbidities  Age;Behavior Pattern;Comorbidity 3+;Fitness;Past/Current Experience;Time since onset of injury/illness/exacerbation    Comorbidities  CKD Stage 3, HLD, HTN, DM, hx of seizures, depression    Examination-Activity Limitations  Squat;Bend;Locomotion Level;Stairs;Bathing;Lift;Transfers;Carry    Examination-Participation Restrictions  Shop;Cleaning;Meal Prep;Yard Work;Community Activity;Interpersonal Relationship    Stability/Clinical Decision Making  Evolving/Moderate complexity    Rehab Potential  Fair  PT Frequency  2x / week    PT Duration  6 weeks    PT Treatment/Interventions  ADLs/Self Care Home Management;Aquatic Therapy;Cryotherapy;Moist Heat;Stair training;Gait training;DME Instruction;Functional mobility training;Therapeutic activities;Therapeutic exercise;Balance training;Patient/family education;Neuromuscular re-education;Manual techniques;Energy conservation;Taping    PT Next Visit Plan  BLE strengthening, graded activity    PT Home Exercise Plan  Seated and standing hip strengthening    Consulted and Agree with Plan of Care  Patient       Patient will benefit from skilled therapeutic intervention in order to improve the following deficits and impairments:  Abnormal gait, Decreased balance, Decreased endurance, Decreased mobility, Difficulty walking, Hypomobility, Pain, Postural dysfunction, Decreased strength, Decreased activity tolerance, Decreased range of motion, Improper body mechanics  Visit Diagnosis: Muscle weakness (generalized)  Difficulty in walking, not elsewhere classified  Repeated falls     Problem List Patient Active Problem List   Diagnosis Date Noted  . Olecranon bursitis of right elbow 12/17/2018  . Advanced care planning/counseling discussion 09/22/2018   . Weakness of both lower extremities 09/22/2018  . History of hepatitis 01/05/2018  . 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 NAFLD (nonalcoholic fatty liver disease) 09/30/2017  . Obesity, Class I, BMI 30.0-34.9 (see actual BMI) 09/18/2017  . Polyarthralgia 02/15/2017  . OSA (obstructive sleep apnea) 02/15/2017  . Partial epilepsy with impairment of consciousness ([place]) 01/29/2016  . Constipation 07/13/2015  . Low vitamin B12 level 05/27/2015  . Elevated PSA 05/27/2015  . Vitamin D deficiency 03/02/2015  . CKD stage 3 due to type 2 diabetes mellitus ([place])   . Other long term (current) drug therapy 03/12/2012  . Health maintenance examination 04/26/2011  . TOTAL KNEE REPLACEMENT, LEFT, HX OF 04/07/2010  . Controlled type 2 diabetes mellitus with diabetic nephropathy ([place]) 03/30/2010  . Hyperlipidemia 03/30/2010  . 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 Essential hypertension, benign 03/30/2010   Myles Gip PT, DPT [phone] 06/05/2019, 12:32 PM  [facility] [address] [place], [place], [address] Phone: [phone]   Fax:  [phone]  Name: Kirk Bennett. MRN: [id number] Date of Birth: 07/31/1951

## 2019-06-10 ENCOUNTER — Other Ambulatory Visit: Payer: Self-pay

## 2019-06-10 ENCOUNTER — Ambulatory Visit: Payer: 59 | Admitting: Physical Therapy

## 2019-06-10 ENCOUNTER — Encounter: Payer: Self-pay | Admitting: Physical Therapy

## 2019-06-10 DIAGNOSIS — R262 Difficulty in walking, not elsewhere classified: Secondary | ICD-10-CM

## 2019-06-10 DIAGNOSIS — M6281 Muscle weakness (generalized): Secondary | ICD-10-CM | POA: Diagnosis not present

## 2019-06-10 DIAGNOSIS — R296 Repeated falls: Secondary | ICD-10-CM

## 2019-06-10 NOTE — Therapy (Signed)
[facility] [address] [place], [place], [address] Phone: [phone]   Fax:  [phone]  Physical Therapy Treatment  Patient Details  Name: Kirk Bennett. MRN: [id number] Date of Birth: 06-Mar-1951 Referring Provider (PT): Driscilla Moats, MD   Encounter Date: 06/10/2019  PT End of Session - 06/10/19 0907    Visit Number  11    Number of Visits  21    Date for PT Re-Evaluation  07/17/19    Authorization Type  PN 06/05/2019    PT Start Time  0910    PT Stop Time  0950    PT Time Calculation (min)  40 min    Equipment Utilized During Treatment  Gait belt    Activity Tolerance  Patient tolerated treatment well    Behavior During Therapy  [facility] for tasks assessed/performed       Past Medical History:  Diagnosis Date  . Cataracts, bilateral 02/2011   and suspected glaucoma, to return for f/u, no diabetic retinopathy  . CKD stage 3 due to type 2 diabetes mellitus ([place]) 2015   normal renal US, self referred to Dr Holley Raring  . 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 Mora Bellman at [facility])  . Depression    found by neuro  . Elevated PSA 05/27/2015   Serial monitoring ([place])   . Glaucoma 2015   suspect  . History of hepatitis A   . HLD (hyperlipidemia)   . HTN (hypertension)   . Knee pain    s/p replacement  . SVT (supraventricular tachycardia) ([place]) 1996  . Vitamin D deficiency 03/02/2015  . 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 Gabriel Carina endo --> 02/2016 decided to return to PCP for DM care    Past Surgical History:  Procedure Laterality Date  . CARDIAC CATHETERIZATION  July 2010   No blockages (Dr. Liliane Shi)  . COLONOSCOPY  09/16/2005   hyperplastic polyps, rpt due 10 yrs   . COLONOSCOPY WITH PROPOFOL N/A 12/11/2015   mult polyps, few TA, rpt 72yr ([place])  .  corrective surgery amblyopia  1957  . Cystectomy or meningioma removal brain  1990   (unclear)  . Left knee surgery  1971   Torn ACL  . REPLAC[facility] T[facility] KN[facility]  April 2011   [facility]    There were no vitals filed for this visit.  Subjective Assessment - 06/10/19 0916    Subjective  Patient states that he had a nice weekend and everything went according to plan. He does not report any falls since his last session; he notes that he is stiff and tired this early in the morning.    Currently in Pain?  No/denies       TREATMENT Therapeutic Exercise: NuStep, L2, seat 15, x8 min for warm-up and leg strengthening B LAQ 2x12, 5# CW Sit to Stand, two foam elevation of seated surface, no UE support 3x10 with 8# dumbbell Hip extension walk, B, #5 CW, 2x10 Forward lunge walk, B, #5 CW. 2x10 Calf raises, B, ball block 2x15 Seated toe raises, B, #5, x20  Patient educated throughout session on appropriate technique and form using multi-modal cueing, HEP, and activity modification.   Patient Response to interventions: Patient 5/10 energy level at end of session.  ASSESSMENT Patient presents to clinic with excellent motivation to participate in  therapy. Patient demonstrates deficits in BLE strength, pain, function, balance, gait, and posture. Patient demonstrating improving form with whole foot contact during weighted STS during today's session and responded positively to all other active interventions. Patient will benefit from continued skilled therapeutic intervention to address remaining deficits in BLE strength, pain, function, balance, gait, and posture in order to decrease risk of fall, increase function, and improve overall QOL.    PT Long Term Goals - 06/05/19 1009      PT LONG TERM GOAL #1   Title  Patient will be independent with HEP in order to improve strength and balance in order to decrease fall risk and improve function at home and work.    Baseline  IE: not  initiated    Time  4    Period  Weeks    Status  Achieved      PT LONG TERM GOAL #2   Title  Patient will improve ABC by at least 13% in order to demonstrate clinically significant improvement in balance confidence.    Baseline  IE: 69.69%; 10/14: 80%    Time  6    Period  Weeks    Target Date  07/17/19      PT LONG TERM GOAL #3   Title  Patient will decrease 5TSTS by at least 3 seconds and perform from a standard height chair in order to demonstrate clinically significant improvement in LE strength.    Baseline  IE: 14.3 sec from an elevated surface (2 foam pads); 10/14: 15.8 sec from elevated surface (1 foam pad)    Time  6    Period  Weeks    Status  Partially Met    Target Date  07/17/19      PT LONG TERM GOAL #4   Title  Patient will increase 6MWT by at least 85m(1657f in order to demonstrate clinically significant improvement in cardiopulmonary endurance and community ambulation.    Baseline  IE: 624 feet; 10/14: 840 feet    Time  6    Period  Weeks    Status  Partially Met    Target Date  07/17/19            Plan - 06/10/19 0908    Clinical Impression Statement  Patient presents to clinic with excellent motivation to participate in therapy. Patient demonstrates deficits in BLE strength, pain, function, balance, gait, and posture. Patient demonstrating improving form with whole foot contact during weighted STS during today's session and responded positively to all other active interventions. Patient will benefit from continued skilled therapeutic intervention to address remaining deficits in BLE strength, pain, function, balance, gait, and posture in order to decrease risk of fall, increase function, and improve overall QOL.    Personal Factors and Comorbidities  Age;Behavior Pattern;Comorbidity 3+;Fitness;Past/Current Experience;Time since onset of injury/illness/exacerbation    Comorbidities  CKD Stage 3, HLD, HTN, DM, hx of seizures, depression     Examination-Activity Limitations  Squat;Bend;Locomotion Level;Stairs;Bathing;Lift;Transfers;Carry    Examination-Participation Restrictions  Shop;Cleaning;Meal Prep;Yard Work;Community Activity;Interpersonal Relationship    Stability/Clinical Decision Making  Evolving/Moderate complexity    Rehab Potential  Fair    PT Frequency  2x / week    PT Duration  6 weeks    PT Treatment/Interventions  ADLs/Self Care Home Management;Aquatic Therapy;Cryotherapy;Moist Heat;Stair training;Gait training;DME Instruction;Functional mobility training;Therapeutic activities;Therapeutic exercise;Balance training;Patient/family education;Neuromuscular re-education;Manual techniques;Energy conservation;Taping    PT Next Visit Plan  BLE strengthening, graded activity    PT Home Exercise Plan  Seated and  standing hip strengthening    Consulted and Agree with Plan of Care  Patient       Patient will benefit from skilled therapeutic intervention in order to improve the following deficits and impairments:  Abnormal gait, Decreased balance, Decreased endurance, Decreased mobility, Difficulty walking, Hypomobility, Pain, Postural dysfunction, Decreased strength, Decreased activity tolerance, Decreased range of motion, Improper body mechanics  Visit Diagnosis: Muscle weakness (generalized)  Difficulty in walking, not elsewhere classified  Repeated falls     Problem List Patient Active Problem List   Diagnosis Date Noted  . Olecranon bursitis of right elbow 12/17/2018  . Advanced care planning/counseling discussion 09/22/2018  . Weakness of both lower extremities 09/22/2018  . History of hepatitis 01/05/2018  . 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 NAFLD (nonalcoholic fatty liver disease) 09/30/2017  . Obesity, Class I, BMI 30.0-34.9 (see actual BMI) 09/18/2017  . Polyarthralgia 02/15/2017  . OSA (obstructive sleep apnea) 02/15/2017  . Partial epilepsy with impairment of consciousness ([place])  01/29/2016  . Constipation 07/13/2015  . Low vitamin B12 level 05/27/2015  . Elevated PSA 05/27/2015  . Vitamin D deficiency 03/02/2015  . CKD stage 3 due to type 2 diabetes mellitus ([place])   . Other long term (current) drug therapy 03/12/2012  . Health maintenance examination 04/26/2011  . TOTAL KNEE REPLACEMENT, LEFT, HX OF 04/07/2010  . Controlled type 2 diabetes mellitus with diabetic nephropathy ([place]) 03/30/2010  . Hyperlipidemia 03/30/2010  . 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 Essential hypertension, benign 03/30/2010   Myles Gip PT, DPT [phone] 06/10/2019, 10:00 AM  [facility] [address] [place], [place], [address] Phone: [phone]   Fax:  [phone]  Name: Kirk Bennett. MRN: [id number] Date of Birth: 10/10/1950

## 2019-06-12 ENCOUNTER — Other Ambulatory Visit: Payer: Self-pay

## 2019-06-12 ENCOUNTER — Ambulatory Visit: Payer: 59 | Admitting: Physical Therapy

## 2019-06-12 ENCOUNTER — Encounter: Payer: Self-pay | Admitting: Physical Therapy

## 2019-06-12 DIAGNOSIS — M6281 Muscle weakness (generalized): Secondary | ICD-10-CM | POA: Diagnosis not present

## 2019-06-12 DIAGNOSIS — R296 Repeated falls: Secondary | ICD-10-CM

## 2019-06-12 DIAGNOSIS — R262 Difficulty in walking, not elsewhere classified: Secondary | ICD-10-CM

## 2019-06-12 NOTE — Therapy (Signed)
[facility] [address]. [place], [place], [address] Phone: [phone]   Fax:  [phone]  Physical Therapy Treatment  Patient Details  Name: Kirk Bennett. MRN: [id number] Date of Birth: 02-27-1951 Referring Provider (PT): Driscilla Moats, MD   Encounter Date: 06/12/2019  PT End of Session - 06/12/19 0918    Visit Number  12    Number of Visits  21    Date for PT Re-Evaluation  07/17/19    Authorization Type  PN 06/05/2019    PT Start Time  0915    PT Stop Time  0955    PT Time Calculation (min)  40 min    Equipment Utilized During Treatment  Gait belt    Activity Tolerance  Patient tolerated treatment well    Behavior During Therapy  [facility] for tasks assessed/performed       Past Medical History:  Diagnosis Date  . Cataracts, bilateral 02/2011   and suspected glaucoma, to return for f/u, no diabetic retinopathy  . CKD stage 3 due to type 2 diabetes mellitus ([place]) 2015   normal renal US, self referred to Dr Holley Raring  . 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 Mora Bellman at [facility])  . Depression    found by neuro  . Elevated PSA 05/27/2015   Serial monitoring ([place])   . Glaucoma 2015   suspect  . History of hepatitis A   . HLD (hyperlipidemia)   . HTN (hypertension)   . Knee pain    s/p replacement  . SVT (supraventricular tachycardia) ([place]) 1996  . Vitamin D deficiency 03/02/2015  . 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 Gabriel Carina endo --> 02/2016 decided to return to PCP for DM care    Past Surgical History:  Procedure Laterality Date  . CARDIAC CATHETERIZATION  July 2010   No blockages (Dr. Liliane Shi)  . COLONOSCOPY  09/16/2005   hyperplastic polyps, rpt due 10 yrs   . COLONOSCOPY WITH PROPOFOL N/A 12/11/2015   mult polyps, few TA, rpt 48yr ([place])  .  corrective surgery amblyopia  1957  . Cystectomy or meningioma removal brain  1990   (unclear)  . Left knee surgery  1971   Torn ACL  . REPLACEMENT TOTAL KNEE  April 2011   [facility]    There were no vitals filed for this visit.  Subjective Assessment - 06/12/19 0917    Subjective  Patient notes that he is feeling like he should go back home because he's tired. Patient reports that he felt fine after his session on Monday and was able to do what he needed to do the rest of the day.    Currently in Pain?  Yes    Pain Score  3     Pain Location  Knee    Pain Orientation  Right;Left    Pain Descriptors / Indicators  Aching       TREATMENT Therapeutic Exercise: NuStep, L2-3, seat 15, x10 min for warm-up and leg strengthening Sit to Stand, one foam elevation of seated surface, no UE support 1x8 with 8# dumbbell Sit to Stand, standard height chair, no UE support 1x8, SBA/CGA Sit to Stand, compliant surface, no UE support 1x8, SBA/CGA Lateral walk, B, #5 CW, x4 laps Hip extension, B, #5 CW, 2x15 Airex balance beam lateral walk  for improved midfoot WB, ankle control, and hip strengthening, x4 laps, SBA  Patient educated throughout session on appropriate technique and form using multi-modal cueing, HEP, and activity modification.   Patient Response to interventions: Patient denies feeling fatigued at end of session.  ASSESSMENT Patient presents to clinic with excellent motivation to participate in therapy. Patient demonstrates deficits in BLE strength, pain, function, balance, gait, and posture. Patient demonstrating significant improvements in BLE strength with functional task (STS) repetitions during today's session and responded positively to all other active interventions. Patient will benefit from continued skilled therapeutic intervention to address remaining deficits in BLE strength, pain, function, balance, gait, and posture in order to decrease risk of fall,  increase function, and improve overall QOL.     PT Long Term Goals - 06/05/19 1009      PT LONG TERM GOAL #1   Title  Patient will be independent with HEP in order to improve strength and balance in order to decrease fall risk and improve function at home and work.    Baseline  IE: not initiated    Time  4    Period  Weeks    Status  Achieved      PT LONG TERM GOAL #2   Title  Patient will improve ABC by at least 13% in order to demonstrate clinically significant improvement in balance confidence.    Baseline  IE: 69.69%; 10/14: 80%    Time  6    Period  Weeks    Target Date  07/17/19      PT LONG TERM GOAL #3   Title  Patient will decrease 5TSTS by at least 3 seconds and perform from a standard height chair in order to demonstrate clinically significant improvement in LE strength.    Baseline  IE: 14.3 sec from an elevated surface (2 foam pads); 10/14: 15.8 sec from elevated surface (1 foam pad)    Time  6    Period  Weeks    Status  Partially Met    Target Date  07/17/19      PT LONG TERM GOAL #4   Title  Patient will increase 6MWT by at least 75m(1634f in order to demonstrate clinically significant improvement in cardiopulmonary endurance and community ambulation.    Baseline  IE: 624 feet; 10/14: 840 feet    Time  6    Period  Weeks    Status  Partially Met    Target Date  07/17/19            Plan - 06/12/19 0919    Clinical Impression Statement  Patient presents to clinic with excellent motivation to participate in therapy. Patient demonstrates deficits in BLE strength, pain, function, balance, gait, and posture. Patient demonstrating significant improvements in BLE strength with functional task (STS) repetitions during today's session and responded positively to all other active interventions. Patient will benefit from continued skilled therapeutic intervention to address remaining deficits in BLE strength, pain, function, balance, gait, and posture in order to  decrease risk of fall, increase function, and improve overall QOL.    Personal Factors and Comorbidities  Age;Behavior Pattern;Comorbidity 3+;Fitness;Past/Current Experience;Time since onset of injury/illness/exacerbation    Comorbidities  CKD Stage 3, HLD, HTN, DM, hx of seizures, depression    Examination-Activity Limitations  Squat;Bend;Locomotion Level;Stairs;Bathing;Lift;Transfers;Carry    Examination-Participation Restrictions  Shop;Cleaning;Meal Prep;Yard Work;Community Activity;Interpersonal Relationship    Stability/Clinical Decision Making  Evolving/Moderate complexity    Rehab Potential  Fair    PT Frequency  2x / week    PT Duration  6 weeks    PT Treatment/Interventions  ADLs/Self Care Home Management;Aquatic Therapy;Cryotherapy;Moist Heat;Stair training;Gait training;DME Instruction;Functional mobility training;Therapeutic activities;Therapeutic exercise;Balance training;Patient/family education;Neuromuscular re-education;Manual techniques;Energy conservation;Taping    PT Next Visit Plan  BLE strengthening, graded activity    PT Home Exercise Plan  Seated and standing hip strengthening    Consulted and Agree with Plan of Care  Patient       Patient will benefit from skilled therapeutic intervention in order to improve the following deficits and impairments:  Abnormal gait, Decreased balance, Decreased endurance, Decreased mobility, Difficulty walking, Hypomobility, Pain, Postural dysfunction, Decreased strength, Decreased activity tolerance, Decreased range of motion, Improper body mechanics  Visit Diagnosis: Muscle weakness (generalized)  Difficulty in walking, not elsewhere classified  Repeated falls     Problem List Patient Active Problem List   Diagnosis Date Noted  . Olecranon bursitis of right elbow 12/17/2018  . Advanced care planning/counseling discussion 09/22/2018  . Weakness of both lower extremities 09/22/2018  . History of hepatitis 01/05/2018  . LAFB  (left anterior fascicular block) 01/05/2018  . NAFLD (nonalcoholic fatty liver disease) 09/30/2017  . Obesity, Class I, BMI 30.0-34.9 (see actual BMI) 09/18/2017  . Polyarthralgia 02/15/2017  . OSA (obstructive sleep apnea) 02/15/2017  . Partial epilepsy with impairment of consciousness ([place]) 01/29/2016  . Constipation 07/13/2015  . Low vitamin B12 level 05/27/2015  . Elevated PSA 05/27/2015  . Vitamin D deficiency 03/02/2015  . CKD stage 3 due to type 2 diabetes mellitus ([place])   . Other long term (current) drug therapy 03/12/2012  . Health maintenance examination 04/26/2011  . TOTAL KNEE REPLACEMENT, LEFT, HX OF 04/07/2010  . Controlled type 2 diabetes mellitus with diabetic nephropathy ([place]) 03/30/2010  . Hyperlipidemia 03/30/2010  . 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 Essential hypertension, benign 03/30/2010   Myles Gip PT, DPT [phone] 06/12/2019, 1:28 PM  [facility] [address] [place], [place], [address] Phone: [phone]   Fax:  [phone]  Name: Kirk Bennett. MRN: [id number] Date of Birth: 04/07/1951

## 2019-06-17 ENCOUNTER — Ambulatory Visit: Payer: 59 | Admitting: Physical Therapy

## 2019-06-17 ENCOUNTER — Encounter: Payer: Self-pay | Admitting: Physical Therapy

## 2019-06-17 ENCOUNTER — Other Ambulatory Visit: Payer: Self-pay

## 2019-06-17 DIAGNOSIS — R296 Repeated falls: Secondary | ICD-10-CM

## 2019-06-17 DIAGNOSIS — R262 Difficulty in walking, not elsewhere classified: Secondary | ICD-10-CM

## 2019-06-17 DIAGNOSIS — M6281 Muscle weakness (generalized): Secondary | ICD-10-CM

## 2019-06-17 NOTE — Therapy (Signed)
[facility] [address] [place], [place], [address] Phone: [phone]   Fax:  [phone]  Physical Therapy Treatment  Patient Details  Name: Kirk Bennett. MRN: [id number] Date of Birth: Feb 01, 1951 Referring Provider (PT): Driscilla Moats, MD   Encounter Date: 06/17/2019  PT End of Session - 06/17/19 1103    Visit Number  13    Number of Visits  21    Date for PT Re-Evaluation  07/17/19    Authorization Type  PN 06/05/2019    PT Start Time  1059    PT Stop Time  1146    PT Time Calculation (min)  47 min    Equipment Utilized During Treatment  Gait belt    Activity Tolerance  Patient tolerated treatment well    Behavior During Therapy  WFL for tasks assessed/performed       Past Medical History:  Diagnosis Date  . Cataracts, bilateral 02/2011   and suspected glaucoma, to return for f/u, no diabetic retinopathy  . CKD stage 3 due to type 2 diabetes mellitus ([place]) 2015   normal renal US, self referred to Dr Holley Raring  . 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 Mora Bellman at [facility])  . Depression    found by neuro  . Elevated PSA 05/27/2015   Serial monitoring ([place])   . Glaucoma 2015   suspect  . History of hepatitis A   . HLD (hyperlipidemia)   . HTN (hypertension)   . Knee pain    s/p replacement  . SVT (supraventricular tachycardia) ([place]) 1996  . Vitamin D deficiency 03/02/2015  . 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 Gabriel Carina endo --> 02/2016 decided to return to PCP for DM care    Past Surgical History:  Procedure Laterality Date  . CARDIAC CATHETERIZATION  July 2010   No blockages (Dr. Liliane Shi)  . COLONOSCOPY  09/16/2005   hyperplastic polyps, rpt due 10 yrs   . COLONOSCOPY WITH PROPOFOL N/A 12/11/2015   mult polyps, few TA, rpt 20yr ([place])  .  corrective surgery amblyopia  1957  . Cystectomy or meningioma removal brain  1990   (unclear)  . Left knee surgery  1971   Torn ACL  . REPLACEMENT TOTAL KNEE  April 2011   [facility]    There were no vitals filed for this visit.  Subjective Assessment - 06/17/19 1102    Subjective  Patient states he is not ready this morning. He reports a nice weekend. Denies any significant changes/concerns this morning.    Currently in Pain?  No/denies       TREATMENT Therapeutic Exercise: NuStep, L4, seat 15, x10 min for warm-up  and leg strengthening Sit to Stand, one foam elevation of seated surface, no UE support 1x8 with 9# dumbbell Sit to Stand, standard height chair, no UE support 1x8, SBA Sit to Stand, compliant surface, no UE support 1x8, SBA Sit to Stand from low, soft surface with UE support, x5 attempts. Patient unable to perform without compensatory patterns that increase the risk of a fall. Discontinued this session. Lateral walk, B, #5 CW, x3 laps Hip extension, B, #5 CW, 2x15 with VCs to decrease compensatory trunk flexion Hamstring curl, B, #5 CW, x10 with apparent L hamstring fasciculations Airex balance beam lateral walk for improved midfoot WB, ankle  control, and hip strengthening, x3 laps, SBA  Patient educated throughout session on appropriate technique and form using multi-modal cueing, HEP, and activity modification.   Patient Response to interventions: Patient notes that this session was a workout.  ASSESSMENT Patient presents to clinic with excellent motivation to participate in therapy. Patient demonstrates deficits in BLE strength, pain, function, balance, gait, and posture. Patient continues to demonstrate significant improvements in BLE strength with functional task (STS) repetitions even from soft, low surfaces during today's session and responded positively to all other active interventions. Patient will benefit from continued skilled therapeutic  intervention to address remaining deficits in BLE strength, pain, function, balance, gait, and posture in order to decrease risk of fall, increase function, and improve overall QOL.    PT Long Term Goals - 06/05/19 1009      PT LONG TERM GOAL #1   Title  Patient will be independent with HEP in order to improve strength and balance in order to decrease fall risk and improve function at home and work.    Baseline  IE: not initiated    Time  4    Period  Weeks    Status  Achieved      PT LONG TERM GOAL #2   Title  Patient will improve ABC by at least 13% in order to demonstrate clinically significant improvement in balance confidence.    Baseline  IE: 69.69%; 10/14: 80%    Time  6    Period  Weeks    Target Date  07/17/19      PT LONG TERM GOAL #3   Title  Patient will decrease 5TSTS by at least 3 seconds and perform from a standard height chair in order to demonstrate clinically significant improvement in LE strength.    Baseline  IE: 14.3 sec from an elevated surface (2 foam pads); 10/14: 15.8 sec from elevated surface (1 foam pad)    Time  6    Period  Weeks    Status  Partially Met    Target Date  07/17/19      PT LONG TERM GOAL #4   Title  Patient will increase 6MWT by at least 34m(163f in order to demonstrate clinically significant improvement in cardiopulmonary endurance and community ambulation.    Baseline  IE: 624 feet; 10/14: 840 feet    Time  6    Period  Weeks    Status  Partially Met    Target Date  07/17/19            Plan - 06/17/19 1103    Clinical Impression Statement  Patient presents to clinic with excellent motivation to participate in therapy. Patient demonstrates deficits in BLE strength, pain, function, balance, gait, and posture. Patient continues to demonstrate significant improvements in BLE strength with functional task (STS) repetitions even from soft, low surfaces during today's session and responded positively to all other active  interventions. Patient will benefit from continued skilled therapeutic intervention to address remaining deficits in BLE strength, pain, function, balance, gait, and posture in order to decrease risk of fall, increase function, and improve overall QOL.    Personal Factors and Comorbidities  Age;Behavior Pattern;Comorbidity 3+;Fitness;Past/Current Experience;Time since onset of injury/illness/exacerbation    Comorbidities  CKD Stage 3, HLD, HTN, DM, hx of seizures, depression    Examination-Activity Limitations  Squat;Bend;Locomotion Level;Stairs;Bathing;Lift;Transfers;Carry    Examination-Participation Restrictions  Shop;Cleaning;Meal Prep;Yard Work;Community Activity;Interpersonal Relationship    Stability/Clinical Decision Making  Evolving/Moderate complexity    Rehab  Potential  Fair    PT Frequency  2x / week    PT Duration  6 weeks    PT Treatment/Interventions  ADLs/Self Care Home Management;Aquatic Therapy;Cryotherapy;Moist Heat;Stair training;Gait training;DME Instruction;Functional mobility training;Therapeutic activities;Therapeutic exercise;Balance training;Patient/family education;Neuromuscular re-education;Manual techniques;Energy conservation;Taping    PT Next Visit Plan  BLE strengthening, graded activity    PT Home Exercise Plan  Seated and standing hip strengthening    Consulted and Agree with Plan of Care  Patient       Patient will benefit from skilled therapeutic intervention in order to improve the following deficits and impairments:  Abnormal gait, Decreased balance, Decreased endurance, Decreased mobility, Difficulty walking, Hypomobility, Pain, Postural dysfunction, Decreased strength, Decreased activity tolerance, Decreased range of motion, Improper body mechanics  Visit Diagnosis: Muscle weakness (generalized)  Difficulty in walking, not elsewhere classified  Repeated falls     Problem List Patient Active Problem List   Diagnosis Date Noted  . Olecranon  bursitis of right elbow 12/17/2018  . Advanced care planning/counseling discussion 09/22/2018  . Weakness of both lower extremities 09/22/2018  . History of hepatitis 01/05/2018  . 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 NAFLD (nonalcoholic fatty liver disease) 09/30/2017  . Obesity, Class I, BMI 30.0-34.9 (see actual BMI) 09/18/2017  . Polyarthralgia 02/15/2017  . OSA (obstructive sleep apnea) 02/15/2017  . Partial epilepsy with impairment of consciousness ([place]) 01/29/2016  . Constipation 07/13/2015  . Low vitamin B12 level 05/27/2015  . Elevated PSA 05/27/2015  . Vitamin D deficiency 03/02/2015  . CKD stage 3 due to type 2 diabetes mellitus ([place])   . Other long term (current) drug therapy 03/12/2012  . Health maintenance examination 04/26/2011  . TOTAL KNEE REPLACEMENT, LEFT, HX OF 04/07/2010  . Controlled type 2 diabetes mellitus with diabetic nephropathy ([place]) 03/30/2010  . Hyperlipidemia 03/30/2010  . 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 Essential hypertension, benign 03/30/2010   Myles Gip PT, DPT [phone] 06/17/2019, 12:41 PM  [facility] [address] [place], [place], [address] Phone: [phone]   Fax:  [phone]  Name: Kirk Bennett. MRN: [id number] Date of Birth: [birth year]

## 2019-06-19 ENCOUNTER — Other Ambulatory Visit: Payer: Self-pay

## 2019-06-19 ENCOUNTER — Encounter: Payer: Self-pay | Admitting: Physical Therapy

## 2019-06-19 ENCOUNTER — Ambulatory Visit: Payer: 59 | Admitting: Physical Therapy

## 2019-06-19 DIAGNOSIS — R262 Difficulty in walking, not elsewhere classified: Secondary | ICD-10-CM

## 2019-06-19 DIAGNOSIS — R296 Repeated falls: Secondary | ICD-10-CM

## 2019-06-19 DIAGNOSIS — M6281 Muscle weakness (generalized): Secondary | ICD-10-CM

## 2019-06-19 NOTE — Therapy (Signed)
[facility] [address]. [place], [place], [address] Phone: [phone]   Fax:  [phone]  Physical Therapy Treatment  Patient Details  Name: Kirk Bennett. MRN: [id number] Date of Birth: 1951/01/23 Referring Provider (PT): Driscilla Moats, MD   Encounter Date: 06/19/2019  PT End of Session - 06/19/19 1103    Visit Number  14    Number of Visits  21    Date for PT Re-Evaluation  07/17/19    Authorization Type  PN 06/05/2019    PT Start Time  1056    PT Stop Time  1149    PT Time Calculation (min)  53 min    Equipment Utilized During Treatment  Gait belt    Activity Tolerance  Patient tolerated treatment well    Behavior During Therapy  WFL for tasks assessed/performed       Past Medical History:  Diagnosis Date  . Cataracts, bilateral 02/2011   and suspected glaucoma, to return for f/u, no diabetic retinopathy  . CKD stage 3 due to type 2 diabetes mellitus ([place]) 2015   normal renal US, self referred to Dr Holley Raring  . 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 Mora Bellman at [facility])  . Depression    found by neuro  . Elevated PSA 05/27/2015   Serial monitoring ([place])   . Glaucoma 2015   suspect  . History of hepatitis A   . HLD (hyperlipidemia)   . HTN (hypertension)   . Knee pain    s/p replacement  . SVT (supraventricular tachycardia) ([place]) 1996  . Vitamin D deficiency 03/02/2015  . 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 Gabriel Carina endo --> 02/2016 decided to return to PCP for DM care    Past Surgical History:  Procedure Laterality Date  . CARDIAC CATHETERIZATION  July 2010   No blockages (Dr. Liliane Shi)  . COLONOSCOPY  09/16/2005   hyperplastic polyps, rpt due 10 yrs   . COLONOSCOPY WITH PROPOFOL N/A 12/11/2015   mult polyps, few TA, rpt 81yr ([place])  .  corrective surgery amblyopia  1957  . Cystectomy or meningioma removal brain  1990   (unclear)  . Left knee surgery  1971   Torn ACL  . REPLACEMENT TOTAL KNEE  April 2011   [facility]    There were no vitals filed for this visit.  Subjective Assessment - 06/19/19 1102    Subjective  Patient reports no significant concerns or changes since last visit. Denies falls. Patient denies significant knee pain in the lobby but says that may change with the exercises.    Currently in Pain?  No/denies      TREATMENT Therapeutic Exercise: NuStep, L4-5, seat 15, x10 min for warm-up  and leg strengthening Sit to Stand, one foam elevation on low, soft seating surface, no UE support 1x10 Sit to Stand, one foam elevation on low, soft seating surface, no UE support 1x10 with 5# dumbbell Sit to Stand from low, soft surface with BUE pull support, x3 (40% LE 60% UE effort per patient) Airex balance beam lateral walk for improved midfoot WB, ankle control, and hip strengthening, x5 laps, MOD I Heel walking x10 each leg VCs for erect balanced posture Toe walking x10 each leg for VCs for erect balanced posture Elevated plank 3x15 seconds with VCs to decrease  compensatory patterns Elevated mountain climber x4 each leg with VCs for mechanics and to improve core stabilization  Patient educated throughout session on appropriate technique and form using multi-modal cueing, HEP, and activity modification.   Patient Response to interventions: Patient notes 7/10 pain after STS, applied cryotherapy during HEP education/update and pain resolved to a 3/10.  ASSESSMENT Patient presents to clinic with excellent motivation to participate in therapy. Patient demonstrates deficits in BLE strength, pain, function, balance, gait, and posture. Patient demonstrates good form with elevated plank during today's session and responded positively to all other active interventions with pain decreasing within session  after irritation. Patient will benefit from continued skilled therapeutic intervention to address remaining deficits in BLE strength, pain, function, balance, gait, and posture in order to decrease risk of fall, increase function, and improve overall QOL.   PT Long Term Goals - 06/05/19 1009      PT LONG TERM GOAL #1   Title  Patient will be independent with HEP in order to improve strength and balance in order to decrease fall risk and improve function at home and work.    Baseline  IE: not initiated    Time  4    Period  Weeks    Status  Achieved      PT LONG TERM GOAL #2   Title  Patient will improve ABC by at least 13% in order to demonstrate clinically significant improvement in balance confidence.    Baseline  IE: 69.69%; 10/14: 80%    Time  6    Period  Weeks    Target Date  07/17/19      PT LONG TERM GOAL #3   Title  Patient will decrease 5TSTS by at least 3 seconds and perform from a standard height chair in order to demonstrate clinically significant improvement in LE strength.    Baseline  IE: 14.3 sec from an elevated surface (2 foam pads); 10/14: 15.8 sec from elevated surface (1 foam pad)    Time  6    Period  Weeks    Status  Partially Met    Target Date  07/17/19      PT LONG TERM GOAL #4   Title  Patient will increase 6MWT by at least 58m(1611f in order to demonstrate clinically significant improvement in cardiopulmonary endurance and community ambulation.    Baseline  IE: 624 feet; 10/14: 840 feet    Time  6    Period  Weeks    Status  Partially Met    Target Date  07/17/19            Plan - 06/19/19 1104    Clinical Impression Statement  Patient presents to clinic with excellent motivation to participate in therapy. Patient demonstrates deficits in BLE strength, pain, function, balance, gait, and posture. Patient demonstrates good form with elevated plank during today's session and responded positively to all other active interventions with pain  decreasing within session after irritation. Patient will benefit from continued skilled therapeutic intervention to address remaining deficits in BLE strength, pain, function, balance, gait, and posture in order to decrease risk of fall, increase function, and improve overall QOL.    Personal Factors and Comorbidities  Age;Behavior Pattern;Comorbidity 3+;Fitness;Past/Current Experience;Time since onset of injury/illness/exacerbation    Comorbidities  CKD Stage 3, HLD, HTN, DM, hx of seizures, depression    Examination-Activity Limitations  Squat;Bend;Locomotion Level;Stairs;Bathing;Lift;Transfers;Carry    Examination-Participation Restrictions  Shop;Cleaning;Meal Prep;Yard Work;Community Activity;Interpersonal Relationship    Stability/Clinical Decision  Making  Evolving/Moderate complexity    Rehab Potential  Fair    PT Frequency  2x / week    PT Duration  6 weeks    PT Treatment/Interventions  ADLs/Self Care Home Management;Aquatic Therapy;Cryotherapy;Moist Heat;Stair training;Gait training;DME Instruction;Functional mobility training;Therapeutic activities;Therapeutic exercise;Balance training;Patient/family education;Neuromuscular re-education;Manual techniques;Energy conservation;Taping    PT Next Visit Plan  BLE strengthening, graded activity    PT Home Exercise Plan  Seated and standing hip strengthening    Consulted and Agree with Plan of Care  Patient       Patient will benefit from skilled therapeutic intervention in order to improve the following deficits and impairments:  Abnormal gait, Decreased balance, Decreased endurance, Decreased mobility, Difficulty walking, Hypomobility, Pain, Postural dysfunction, Decreased strength, Decreased activity tolerance, Decreased range of motion, Improper body mechanics  Visit Diagnosis: Muscle weakness (generalized)  Difficulty in walking, not elsewhere classified  Repeated falls     Problem List Patient Active Problem List   Diagnosis  Date Noted  . Olecranon bursitis of right elbow 12/17/2018  . Advanced care planning/counseling discussion 09/22/2018  . Weakness of both lower extremities 09/22/2018  . History of hepatitis 01/05/2018  . 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 NAFLD (nonalcoholic fatty liver disease) 09/30/2017  . Obesity, Class I, BMI 30.0-34.9 (see actual BMI) 09/18/2017  . Polyarthralgia 02/15/2017  . OSA (obstructive sleep apnea) 02/15/2017  . Partial epilepsy with impairment of consciousness ([place]) 01/29/2016  . Constipation 07/13/2015  . Low vitamin B12 level 05/27/2015  . Elevated PSA 05/27/2015  . Vitamin D deficiency 03/02/2015  . CKD stage 3 due to type 2 diabetes mellitus ([place])   . Other long term (current) drug therapy 03/12/2012  . Health maintenance examination 04/26/2011  . TOTAL KNEE REPLACEMENT, LEFT, HX OF 04/07/2010  . Controlled type 2 diabetes mellitus with diabetic nephropathy ([place]) 03/30/2010  . Hyperlipidemia 03/30/2010  . 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 Essential hypertension, benign 03/30/2010   Myles Gip PT, DPT [phone] 06/19/2019, 12:36 PM  [facility] [address] [place], [place], [address] Phone: [phone]   Fax:  [phone]  Name: Kirk Bennett. MRN: [id number] Date of Birth: [birth year]

## 2019-06-24 ENCOUNTER — Encounter: Payer: Self-pay | Admitting: Physical Therapy

## 2019-06-24 ENCOUNTER — Other Ambulatory Visit: Payer: Self-pay

## 2019-06-24 ENCOUNTER — Ambulatory Visit: Payer: 59 | Attending: Nephrology | Admitting: Physical Therapy

## 2019-06-24 DIAGNOSIS — R262 Difficulty in walking, not elsewhere classified: Secondary | ICD-10-CM

## 2019-06-24 DIAGNOSIS — M6281 Muscle weakness (generalized): Secondary | ICD-10-CM | POA: Diagnosis not present

## 2019-06-24 DIAGNOSIS — R296 Repeated falls: Secondary | ICD-10-CM | POA: Diagnosis present

## 2019-06-24 NOTE — Therapy (Signed)
[facility] [address]. [place], [place], [address] Phone: [phone]   Fax:  [phone]  Physical Therapy Treatment  Patient Details  Name: Kirk Bennett. MRN: [id number] Date of Birth: Jan 20, 1951 Referring Provider (PT): Driscilla Moats, MD   Encounter Date: 06/24/2019  PT End of Session - 06/24/19 0914    Visit Number  15    Number of Visits  21    Date for PT Re-Evaluation  07/17/19    Authorization Type  PN 06/05/2019    PT Start Time  0910    PT Stop Time  0950    PT Time Calculation (min)  40 min    Equipment Utilized During Treatment  Gait belt    Activity Tolerance  Patient tolerated treatment well;Patient limited by pain    Behavior During Therapy  [facility] for tasks assessed/performed       Past Medical History:  Diagnosis Date  . Cataracts, bilateral 02/2011   and suspected glaucoma, to return for f/u, no diabetic retinopathy  . CKD stage 3 due to type 2 diabetes mellitus ([place]) 2015   normal renal US, self referred to Dr Holley Raring  . 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 Mora Bellman at [facility])  . Depression    found by neuro  . Elevated PSA 05/27/2015   Serial monitoring ([place])   . Glaucoma 2015   suspect  . History of hepatitis A   . HLD (hyperlipidemia)   . HTN (hypertension)   . Knee pain    s/p replacement  . SVT (supraventricular tachycardia) ([place]) 1996  . Vitamin D deficiency 03/02/2015  . 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 Gabriel Carina endo --> 02/2016 decided to return to PCP for DM care    Past Surgical History:  Procedure Laterality Date  . CARDIAC CATHETERIZATION  July 2010   No blockages (Dr. Liliane Shi)  . COLONOSCOPY  09/16/2005   hyperplastic polyps, rpt due 10 yrs   . COLONOSCOPY WITH PROPOFOL N/A 12/11/2015   mult polyps, few TA, rpt  16yr ([place])  . corrective surgery amblyopia  1957  . Cystectomy or meningioma removal brain  1990   (unclear)  . Left knee surgery  1971   Torn ACL  . REPLACEMENT TOTAL KNEE  April 2011   [facility]    There were no vitals filed for this visit.  Subjective Assessment - 06/24/19 0913    Subjective  Patient states no significant changes or concerns since his last session. He reports mild-moderate knee pain/stiffness this morning.    Currently in Pain?  Yes    Pain Score  7     Pain Location  Knee    Pain Orientation  Left    Pain Descriptors / Indicators  Aching;Dull       TREATMENT Therapeutic Exercise: NuStep, L3-5, seat 15, x10 min, SPM target 70-80 for warm-up  and leg strengthening Sit to Stand, one foam elevation on low, soft seating surface, no UE support 2x10 Sit to Stand, one foam elevation on low, soft seating surface, no UE support 1x5 with 5# dumbbell Sit to Stand from low, soft surface with BUE pull support, 2x6 (60% LE 40% UE effort per patient) Airex balance beam lateral walk for improved midfoot WB, ankle control, and hip strengthening, x5 laps, MOD I 5#  CW, hip abduction walk, x5 laps for improved midfoot WB, ankle control, and hip strengthening 5# CW, hip extension, x12 each leg for hip extension strengthening, minimal cueing for uprgith posture  5# CW, Heel walking x10 each leg VCs for erect balanced posture 5# CW, Toe walking x10 each leg for VCs for erect balanced posture BLE Heel raises, x15, BUE support for improved posterior chain strengthening  Patient educated throughout session on appropriate technique and form using multi-modal cueing, HEP, and activity modification.   Patient Response to interventions: Patient notes 8/10 pain at end of session.  ASSESSMENT Patient presents to clinic with excellent motivation to participate in therapy. Patient demonstrates deficits in BLE strength, pain, function, balance, gait, and posture. Patient  able to stand from low seated surface with roughly 40% UE support during today's session and responded positively to all other active interventions. Patient will benefit from continued skilled therapeutic intervention to address remaining deficits in BLE strength, pain, function, balance, gait, and posture in order to decrease risk of fall, increase function, and improve overall QOL.    PT Long Term Goals - 06/05/19 1009      PT LONG TERM GOAL #1   Title  Patient will be independent with HEP in order to improve strength and balance in order to decrease fall risk and improve function at home and work.    Baseline  IE: not initiated    Time  4    Period  Weeks    Status  Achieved      PT LONG TERM GOAL #2   Title  Patient will improve ABC by at least 13% in order to demonstrate clinically significant improvement in balance confidence.    Baseline  IE: 69.69%; 10/14: 80%    Time  6    Period  Weeks    Target Date  07/17/19      PT LONG TERM GOAL #3   Title  Patient will decrease 5TSTS by at least 3 seconds and perform from a standard height chair in order to demonstrate clinically significant improvement in LE strength.    Baseline  IE: 14.3 sec from an elevated surface (2 foam pads); 10/14: 15.8 sec from elevated surface (1 foam pad)    Time  6    Period  Weeks    Status  Partially Met    Target Date  07/17/19      PT LONG TERM GOAL #4   Title  Patient will increase 6MWT by at least 24m(1666f in order to demonstrate clinically significant improvement in cardiopulmonary endurance and community ambulation.    Baseline  IE: 624 feet; 10/14: 840 feet    Time  6    Period  Weeks    Status  Partially Met    Target Date  07/17/19            Plan - 06/24/19 0915    Clinical Impression Statement  Patient presents to clinic with excellent motivation to participate in therapy. Patient demonstrates deficits in BLE strength, pain, function, balance, gait, and posture. Patient able to  stand from low seated surface with roughly 40% UE support during today's session and responded positively to all other active interventions. Patient will benefit from continued skilled therapeutic intervention to address remaining deficits in BLE strength, pain, function, balance, gait, and posture in order to decrease risk of fall, increase function, and improve overall QOL.    Personal Factors and Comorbidities  Age;Behavior Pattern;Comorbidity 3+;Fitness;Past/Current Experience;Time since onset  of injury/illness/exacerbation    Comorbidities  CKD Stage 3, HLD, HTN, DM, hx of seizures, depression    Examination-Activity Limitations  Squat;Bend;Locomotion Level;Stairs;Bathing;Lift;Transfers;Carry    Examination-Participation Restrictions  Shop;Cleaning;Meal Prep;Yard Work;Community Activity;Interpersonal Relationship    Stability/Clinical Decision Making  Evolving/Moderate complexity    Rehab Potential  Fair    PT Frequency  2x / week    PT Duration  6 weeks    PT Treatment/Interventions  ADLs/Self Care Home Management;Aquatic Therapy;Cryotherapy;Moist Heat;Stair training;Gait training;DME Instruction;Functional mobility training;Therapeutic activities;Therapeutic exercise;Balance training;Patient/family education;Neuromuscular re-education;Manual techniques;Energy conservation;Taping    PT Next Visit Plan  BLE strengthening, graded activity    PT Home Exercise Plan  Seated and standing hip strengthening    Consulted and Agree with Plan of Care  Patient       Patient will benefit from skilled therapeutic intervention in order to improve the following deficits and impairments:  Abnormal gait, Decreased balance, Decreased endurance, Decreased mobility, Difficulty walking, Hypomobility, Pain, Postural dysfunction, Decreased strength, Decreased activity tolerance, Decreased range of motion, Improper body mechanics  Visit Diagnosis: Muscle weakness (generalized)  Difficulty in walking, not elsewhere  classified  Repeated falls     Problem List Patient Active Problem List   Diagnosis Date Noted  . Olecranon bursitis of right elbow 12/17/2018  . Advanced care planning/counseling discussion 09/22/2018  . Weakness of both lower extremities 09/22/2018  . History of hepatitis 01/05/2018  . 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 NAFLD (nonalcoholic fatty liver disease) 09/30/2017  . Obesity, Class I, BMI 30.0-34.9 (see actual BMI) 09/18/2017  . Polyarthralgia 02/15/2017  . OSA (obstructive sleep apnea) 02/15/2017  . Partial epilepsy with impairment of consciousness ([place]) 01/29/2016  . Constipation 07/13/2015  . Low vitamin B12 level 05/27/2015  . Elevated PSA 05/27/2015  . Vitamin D deficiency 03/02/2015  . CKD stage 3 due to type 2 diabetes mellitus ([place])   . Other long term (current) drug therapy 03/12/2012  . Health maintenance examination 04/26/2011  . TOTAL KNEE REPLACEMENT, LEFT, HX OF 04/07/2010  . Controlled type 2 diabetes mellitus with diabetic nephropathy ([place]) 03/30/2010  . Hyperlipidemia 03/30/2010  . 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 Essential hypertension, benign 03/30/2010   Myles Gip PT, DPT [phone] 06/24/2019, 12:56 PM  [facility] [address] [place], [place], [address] Phone: [phone]   Fax:  [phone]  Name: Kirk Bennett. MRN: [id number] Date of Birth: 30-Jul-1951

## 2019-07-02 ENCOUNTER — Other Ambulatory Visit: Payer: Self-pay

## 2019-07-02 ENCOUNTER — Ambulatory Visit: Payer: 59 | Admitting: Physical Therapy

## 2019-07-02 ENCOUNTER — Encounter: Payer: Self-pay | Admitting: Physical Therapy

## 2019-07-02 DIAGNOSIS — M6281 Muscle weakness (generalized): Secondary | ICD-10-CM

## 2019-07-02 DIAGNOSIS — R296 Repeated falls: Secondary | ICD-10-CM

## 2019-07-02 DIAGNOSIS — R262 Difficulty in walking, not elsewhere classified: Secondary | ICD-10-CM

## 2019-07-02 NOTE — Therapy (Signed)
[facility] [address] [place], [place], [address] Phone: [phone]   Fax:  [phone]  Physical Therapy Treatment  Patient Details  Name: Kirk Bennett. MRN: [id number] Date of Birth: 11-Dec-1950 Referring Provider (PT): Driscilla Moats, MD   Encounter Date: 07/02/2019  PT End of Session - 07/02/19 1612    Visit Number  16    Number of Visits  21    Date for PT Re-Evaluation  07/17/19    Authorization Type  PN 06/05/2019    PT Start Time  1615    PT Stop Time  1654    PT Time Calculation (min)  39 min    Equipment Utilized During Treatment  Gait belt    Activity Tolerance  Patient tolerated treatment well;Patient limited by pain;Patient limited by fatigue    Behavior During Therapy  [facility] for tasks assessed/performed       Past Medical History:  Diagnosis Date  . Cataracts, bilateral 02/2011   and suspected glaucoma, to return for f/u, no diabetic retinopathy  . CKD stage 3 due to type 2 diabetes mellitus ([place]) 2015   normal renal US, self referred to Dr Holley Raring  . Complex partial seizures ([place]) 1990   from surgery for R temporal arachnoid cyst s/p drainage, possible continued sz so changed to lamotrigine ([facility]man at [facility])  . Depression    found by neuro  . Elevated PSA 05/27/2015   Serial monitoring ([place])   . Glaucoma 2015   suspect  . History of hepatitis A   . HLD (hyperlipidemia)   . HTN (hypertension)   . Knee pain    s/p replacement  . SVT (supraventricular tachycardia) ([place]) 1996  . Vitamin D deficiency 03/02/2015  . 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 Gabriel Carina endo --> 02/2016 decided to return to PCP for DM care    Past Surgical History:  Procedure Laterality Date  . CARDIAC CATHETERIZATION  July 2010   No blockages (Dr. Liliane Shi)  . COLONOSCOPY  09/16/2005   hyperplastic polyps, rpt due 10 yrs   . COLONOSCOPY WITH PROPOFOL N/A 12/11/2015   mult polyps, few TA, rpt 10yr ([place])  . corrective surgery amblyopia  1957  . Cystectomy or meningioma removal brain  1990   (unclear)  . Left knee surgery  1971   Torn ACL  . REPLACEMENT TOTAL KNEE  April 2011   [facility]    There were no vitals filed for this visit.  Subjective Assessment - 07/02/19 1714    Subjective  Patient reporting high level of fatigue and pain today. He notes no significant changes overall. He affirms that his blood sugar is well-managed today. He denies any other significant changes since his last session.    Currently in Pain?  Yes    Pain Score  7     Pain Location  Generalized       TREATMENT Therapeutic Exercise: NuStep, L0, seat 16, x10 min, SPM target 70-80 for warm-up and leg strengthening Sit to Stand, two foam elevation no UE support 4x6 Sit to Stand, two foam elevation no UE support 2x6 with 5# dumbbell Seated BLE strengthening:  4# CW, LAQ, 2x12  4# CW, hip flexion march, 2x12  4# CW, SLR, 2x12  Patient educated throughout session on appropriate technique and form using multi-modal cueing, HEP, and activity modification.   Patient Response to interventions: Patient notes increased fatigue  at end of session.  ASSESSMENT Patient presents to clinic to participate in therapy with decreased foot clearance during gait in clinic. Patient demonstrates deficits in BLE strength, pain, function, balance, gait, and posture. Patient requiring increased rest breaks and decreased resistance with exercises during today's session but responded positively to active interventions. Patient will benefit from continued skilled therapeutic intervention to address remaining deficits in BLE strength, pain, function, balance, gait, and posture in order to decrease risk of fall, increase function, and improve overall QOL.    PT Long Term Goals - 06/05/19 1009      PT LONG TERM GOAL #1   Title  Patient will be independent with HEP in order to  improve strength and balance in order to decrease fall risk and improve function at home and work.    Baseline  IE: not initiated    Time  4    Period  Weeks    Status  Achieved      PT LONG TERM GOAL #2   Title  Patient will improve ABC by at least 13% in order to demonstrate clinically significant improvement in balance confidence.    Baseline  IE: 69.69%; 10/14: 80%    Time  6    Period  Weeks    Target Date  07/17/19      PT LONG TERM GOAL #3   Title  Patient will decrease 5TSTS by at least 3 seconds and perform from a standard height chair in order to demonstrate clinically significant improvement in LE strength.    Baseline  IE: 14.3 sec from an elevated surface (2 foam pads); 10/14: 15.8 sec from elevated surface (1 foam pad)    Time  6    Period  Weeks    Status  Partially Met    Target Date  07/17/19      PT LONG TERM GOAL #4   Title  Patient will increase 6MWT by at least 80m(1638f in order to demonstrate clinically significant improvement in cardiopulmonary endurance and community ambulation.    Baseline  IE: 624 feet; 10/14: 840 feet    Time  6    Period  Weeks    Status  Partially Met    Target Date  07/17/19            Plan - 07/02/19 1612    Clinical Impression Statement  Patient presents to clinic to participate in therapy with decreased foot clearance during gait in clinic. Patient demonstrates deficits in BLE strength, pain, function, balance, gait, and posture. Patient requiring increased rest breaks and decreased resistance with exercises during today's session but responded positively to active interventions. Patient will benefit from continued skilled therapeutic intervention to address remaining deficits in BLE strength, pain, function, balance, gait, and posture in order to decrease risk of fall, increase function, and improve overall QOL.    Personal Factors and Comorbidities  Age;Behavior Pattern;Comorbidity 3+;Fitness;Past/Current Experience;Time  since onset of injury/illness/exacerbation    Comorbidities  CKD Stage 3, HLD, HTN, DM, hx of seizures, depression    Examination-Activity Limitations  Squat;Bend;Locomotion Level;Stairs;Bathing;Lift;Transfers;Carry    Examination-Participation Restrictions  Shop;Cleaning;Meal Prep;Yard Work;Community Activity;Interpersonal Relationship    Stability/Clinical Decision Making  Evolving/Moderate complexity    Rehab Potential  Fair    PT Frequency  2x / week    PT Duration  6 weeks    PT Treatment/Interventions  ADLs/Self Care Home Management;Aquatic Therapy;Cryotherapy;Moist Heat;Stair training;Gait training;DME Instruction;Functional mobility training;Therapeutic activities;Therapeutic exercise;Balance training;Patient/family education;Neuromuscular re-education;Manual techniques;Energy conservation;Taping  PT Next Visit Plan  BLE strengthening, graded activity    PT Home Exercise Plan  Seated and standing hip strengthening    Consulted and Agree with Plan of Care  Patient       Patient will benefit from skilled therapeutic intervention in order to improve the following deficits and impairments:  Abnormal gait, Decreased balance, Decreased endurance, Decreased mobility, Difficulty walking, Hypomobility, Pain, Postural dysfunction, Decreased strength, Decreased activity tolerance, Decreased range of motion, Improper body mechanics  Visit Diagnosis: Muscle weakness (generalized)  Difficulty in walking, not elsewhere classified  Repeated falls     Problem List Patient Active Problem List   Diagnosis Date Noted  . Olecranon bursitis of right elbow 12/17/2018  . Advanced care planning/counseling discussion 09/22/2018  . Weakness of both lower extremities 09/22/2018  . History of hepatitis 01/05/2018  . 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 NAFLD (nonalcoholic fatty liver disease) 09/30/2017  . Obesity, Class I, BMI 30.0-34.9 (see actual BMI) 09/18/2017  . Polyarthralgia  02/15/2017  . OSA (obstructive sleep apnea) 02/15/2017  . Partial epilepsy with impairment of consciousness ([place]) 01/29/2016  . Constipation 07/13/2015  . Low vitamin B12 level 05/27/2015  . Elevated PSA 05/27/2015  . Vitamin D deficiency 03/02/2015  . CKD stage 3 due to type 2 diabetes mellitus ([place])   . Other long term (current) drug therapy 03/12/2012  . Health maintenance examination 04/26/2011  . TOTAL KNEE REPLACEMENT, LEFT, HX OF 04/07/2010  . Controlled type 2 diabetes mellitus with diabetic nephropathy ([place]) 03/30/2010  . Hyperlipidemia 03/30/2010  . 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 Essential hypertension, benign 03/30/2010   Myles Gip PT, DPT [phone] 07/02/2019, 5:30 PM  [facility] [address] [place], [place], [address] Phone: [phone]   Fax:  [phone]  Name: Kirk Bennett. MRN: [id number] Date of Birth: 1951-04-04

## 2019-07-04 ENCOUNTER — Other Ambulatory Visit: Payer: Self-pay

## 2019-07-04 ENCOUNTER — Ambulatory Visit: Payer: 59 | Admitting: Physical Therapy

## 2019-07-04 ENCOUNTER — Encounter: Payer: Self-pay | Admitting: Physical Therapy

## 2019-07-04 DIAGNOSIS — R262 Difficulty in walking, not elsewhere classified: Secondary | ICD-10-CM

## 2019-07-04 DIAGNOSIS — M6281 Muscle weakness (generalized): Secondary | ICD-10-CM

## 2019-07-04 DIAGNOSIS — R296 Repeated falls: Secondary | ICD-10-CM

## 2019-07-04 NOTE — Therapy (Signed)
[facility] [address] [place], [place], [address] Phone: [phone]   Fax:  [phone]  Physical Therapy Treatment  Patient Details  Name: Kirk Bennett. MRN: [id number] Date of Birth: [birth year] Referring Provider (PT): Driscilla Moats, MD   Encounter Date: 07/04/2019  PT End of Session - 07/04/19 1419    Visit Number  17    Number of Visits  21    Date for PT Re-Evaluation  07/17/19    Authorization Type  PN 06/05/2019    PT Start Time  1258    PT Stop Time  1338    PT Time Calculation (min)  40 min    Equipment Utilized During Treatment  Gait belt    Activity Tolerance  Patient tolerated treatment well;Patient limited by fatigue    Behavior During Therapy  WFL for tasks assessed/performed       Past Medical History:  Diagnosis Date  . Cataracts, bilateral 02/2011   and suspected glaucoma, to return for f/u, no diabetic retinopathy  . CKD stage 3 due to type 2 diabetes mellitus ([place]) 2015   normal renal US, self referred to Dr Holley Raring  . 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 Mora Bellman at [facility])  . Depression    found by neuro  . Elevated PSA 05/27/2015   Serial monitoring ([place])   . Glaucoma 2015   suspect  . History of hepatitis A   . HLD (hyperlipidemia)   . HTN (hypertension)   . Knee pain    s/p replacement  . SVT (supraventricular tachycardia) ([place]) 1996  . Vitamin D deficiency 03/02/2015  . 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 Gabriel Carina endo --> 02/2016 decided to return to PCP for DM care    Past Surgical History:  Procedure Laterality Date  . CARDIAC CATHETERIZATION  July 2010   No blockages (Dr. Liliane Shi)  . COLONOSCOPY  09/16/2005   hyperplastic polyps, rpt due 10 yrs   . COLONOSCOPY WITH PROPOFOL N/A 12/11/2015   mult polyps, few TA,  rpt 9yr ([place])  . corrective surgery amblyopia  1957  . Cystectomy or meningioma removal brain  1990   (unclear)  . Left knee surgery  1971   Torn ACL  . REPLACEMENT TOTAL KNEE  April 2011   [facility]    There were no vitals filed for this visit.  Subjective Assessment - 07/04/19 1416    Subjective  Patient reports better energy than last session, but still rather fatigued. Patient notes his knees aren't bothering him today but his legs just feel heavy.    Currently in Pain?  No/denies       TREATMENT Therapeutic Exercise: NuStep, L0, seat 16, x10 min, SPM target 70-80 for warm-up and leg strengthening 6MWT for improved BLE and postural endurance. 2 LOB. Patient able to recover balance without PT assist. Foot clearance decreased as time lapsed. Patient discontinued test at 5 minutes.  Neuromuscular Re-education: Resisted backwards walking, x5 laps, Nautilus 50#, SBA. Patient demonstrating good postural control throughout.  Seated postural strengthening against resistance:  Wide rows, BTB x30  Lat pull down, BTB, x30  Narrow row, BTB, x30  ER shoulder extension, BTB, x30  Patient educated throughout session on appropriate technique and form using multi-modal cueing, HEP, and activity modification.   Patient Response to  interventions: Patient terminating the session 2/2 to decreased energy.  ASSESSMENT Patient presents to clinic to participate in therapy with decreased foot clearance during gait in clinic. Patient demonstrates deficits in BLE strength, pain, function, balance, gait, and posture. Patient continues to require increased rest breaks during today's session but responded positively to postural interventions. Patient will benefit from continued skilled therapeutic intervention to address remaining deficits in BLE strength, pain, function, balance, gait, and posture in order to decrease risk of fall, increase function, and improve overall QOL.   PT  Long Term Goals - 06/05/19 1009      PT LONG TERM GOAL #1   Title  Patient will be independent with HEP in order to improve strength and balance in order to decrease fall risk and improve function at home and work.    Baseline  IE: not initiated    Time  4    Period  Weeks    Status  Achieved      PT LONG TERM GOAL #2   Title  Patient will improve ABC by at least 13% in order to demonstrate clinically significant improvement in balance confidence.    Baseline  IE: 69.69%; 10/14: 80%    Time  6    Period  Weeks    Target Date  07/17/19      PT LONG TERM GOAL #3   Title  Patient will decrease 5TSTS by at least 3 seconds and perform from a standard height chair in order to demonstrate clinically significant improvement in LE strength.    Baseline  IE: 14.3 sec from an elevated surface (2 foam pads); 10/14: 15.8 sec from elevated surface (1 foam pad)    Time  6    Period  Weeks    Status  Partially Met    Target Date  07/17/19      PT LONG TERM GOAL #4   Title  Patient will increase 6MWT by at least 70m(1650f in order to demonstrate clinically significant improvement in cardiopulmonary endurance and community ambulation.    Baseline  IE: 624 feet; 10/14: 840 feet    Time  6    Period  Weeks    Status  Partially Met    Target Date  07/17/19            Plan - 07/04/19 1419    Clinical Impression Statement  Patient presents to clinic to participate in therapy with decreased foot clearance during gait in clinic. Patient demonstrates deficits in BLE strength, pain, function, balance, gait, and posture. Patient continues to require increased rest breaks during today's session but responded positively to postural interventions. Patient will benefit from continued skilled therapeutic intervention to address remaining deficits in BLE strength, pain, function, balance, gait, and posture in order to decrease risk of fall, increase function, and improve overall QOL.    Personal Factors and  Comorbidities  Age;Behavior Pattern;Comorbidity 3+;Fitness;Past/Current Experience;Time since onset of injury/illness/exacerbation    Comorbidities  CKD Stage 3, HLD, HTN, DM, hx of seizures, depression    Examination-Activity Limitations  Squat;Bend;Locomotion Level;Stairs;Bathing;Lift;Transfers;Carry    Examination-Participation Restrictions  Shop;Cleaning;Meal Prep;Yard Work;Community Activity;Interpersonal Relationship    Stability/Clinical Decision Making  Evolving/Moderate complexity    Rehab Potential  Fair    PT Frequency  2x / week    PT Duration  6 weeks    PT Treatment/Interventions  ADLs/Self Care Home Management;Aquatic Therapy;Cryotherapy;Moist Heat;Stair training;Gait training;DME Instruction;Functional mobility training;Therapeutic activities;Therapeutic exercise;Balance training;Patient/family education;Neuromuscular re-education;Manual techniques;Energy conservation;Taping    PT  Next Visit Plan  BLE strengthening, graded activity    PT Home Exercise Plan  Seated and standing hip strengthening    Consulted and Agree with Plan of Care  Patient       Patient will benefit from skilled therapeutic intervention in order to improve the following deficits and impairments:  Abnormal gait, Decreased balance, Decreased endurance, Decreased mobility, Difficulty walking, Hypomobility, Pain, Postural dysfunction, Decreased strength, Decreased activity tolerance, Decreased range of motion, Improper body mechanics  Visit Diagnosis: Muscle weakness (generalized)  Difficulty in walking, not elsewhere classified  Repeated falls     Problem List Patient Active Problem List   Diagnosis Date Noted  . Olecranon bursitis of right elbow 12/17/2018  . Advanced care planning/counseling discussion 09/22/2018  . Weakness of both lower extremities 09/22/2018  . History of hepatitis 01/05/2018  . 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 NAFLD (nonalcoholic fatty liver disease)  09/30/2017  . Obesity, Class I, BMI 30.0-34.9 (see actual BMI) 09/18/2017  . Polyarthralgia 02/15/2017  . OSA (obstructive sleep apnea) 02/15/2017  . Partial epilepsy with impairment of consciousness ([place]) 01/29/2016  . Constipation 07/13/2015  . Low vitamin B12 level 05/27/2015  . Elevated PSA 05/27/2015  . Vitamin D deficiency 03/02/2015  . CKD stage 3 due to type 2 diabetes mellitus ([place])   . Other long term (current) drug therapy 03/12/2012  . Health maintenance examination 04/26/2011  . TOTAL KNEE REPLACEMENT, LEFT, HX OF 04/07/2010  . Controlled type 2 diabetes mellitus with diabetic nephropathy ([place]) 03/30/2010  . Hyperlipidemia 03/30/2010  . 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 Essential hypertension, benign 03/30/2010   Myles Gip PT, DPT [phone]  07/04/2019, 2:33 PM  [facility] [address] [place], [place], [address] Phone: [phone]   Fax:  [phone]  Name: Kirk Bennett. MRN: [id number] Date of Birth: [birth year]

## 2019-07-08 ENCOUNTER — Ambulatory Visit: Payer: 59 | Admitting: Physical Therapy

## 2019-07-08 ENCOUNTER — Encounter: Payer: Self-pay | Admitting: Physical Therapy

## 2019-07-08 ENCOUNTER — Other Ambulatory Visit: Payer: Self-pay

## 2019-07-08 DIAGNOSIS — R296 Repeated falls: Secondary | ICD-10-CM

## 2019-07-08 DIAGNOSIS — R262 Difficulty in walking, not elsewhere classified: Secondary | ICD-10-CM

## 2019-07-08 DIAGNOSIS — M6281 Muscle weakness (generalized): Secondary | ICD-10-CM | POA: Diagnosis not present

## 2019-07-08 NOTE — Therapy (Signed)
[facility] [address] [place], [place], [address] Phone: [phone]   Fax:  [phone]  Physical Therapy Treatment  Patient Details  Name: Kirk Bennett. MRN: [id number] Date of Birth: 25-May-1951 Referring Provider (PT): Driscilla Moats, MD   Encounter Date: 07/08/2019  PT End of Session - 07/08/19 1307    Visit Number  18    Number of Visits  21    Date for PT Re-Evaluation  07/17/19    Authorization Type  PN 06/05/2019    PT Start Time  1257    PT Stop Time  1340    PT Time Calculation (min)  43 min    Equipment Utilized During Treatment  Gait belt    Activity Tolerance  Patient tolerated treatment well;Patient limited by fatigue;Patient limited by pain    Behavior During Therapy  WFL for tasks assessed/performed       Past Medical History:  Diagnosis Date  . Cataracts, bilateral 02/2011   and suspected glaucoma, to return for f/u, no diabetic retinopathy  . CKD stage 3 due to type 2 diabetes mellitus ([place]) 2015   normal renal US, self referred to Dr Holley Raring  . 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 Mora Bellman at [facility])  . Depression    found by neuro  . Elevated PSA 05/27/2015   Serial monitoring ([place])   . Glaucoma 2015   suspect  . History of hepatitis A   . HLD (hyperlipidemia)   . HTN (hypertension)   . Knee pain    s/p replacement  . SVT (supraventricular tachycardia) ([place]) 1996  . Vitamin D deficiency 03/02/2015  . 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 Gabriel Carina endo --> 02/2016 decided to return to PCP for DM care    Past Surgical History:  Procedure Laterality Date  . CARDIAC CATHETERIZATION  July 2010   No blockages (Dr. Liliane Shi)  . COLONOSCOPY  09/16/2005   hyperplastic polyps, rpt due 10 yrs   . COLONOSCOPY WITH PROPOFOL N/A 12/11/2015   mult polyps, few TA, rpt 95yr ([place])  . corrective surgery amblyopia  1957  . Cystectomy or meningioma removal brain  1990   (unclear)  . Left knee surgery  1971   Torn ACL  . REPLACEMENT TOTAL KNEE  April 2011   [facility]    There were no vitals filed for this visit.  Subjective Assessment - 07/08/19 1305    Subjective  Patient states that he is doing fine. He notes that the upper back postural work we did last session helped his shoulder feel better. He reports his knees are bothering him more today. He also was able to start walking at home to improve endurance. He walked twice since his last session for about 7 minutes each time.    Currently in Pain?  Yes    Pain Score  7     Pain Location  Knee    Pain Orientation  Left;Right    Pain Descriptors / Indicators  Aching       TREATMENT Therapeutic Exercise: NuStep, L5-2, seat 15, x10 min, SPM target 70-80 for warm-up and leg strengthening Seated Strengthening, 5# CW, BLE,  LAQ x15  Hip flexion march x15  Neuromuscular Re-education: Resisted backwards walking x4 laps, lateral walking x2, forward walking x2 Nautilus 50#, SBA.  Patient demonstrating good postural control throughout, but having increased hip pain with later laps. Seated postural strengthening against resistance:  Wide rows, BTB x30  Lat pull down, BTB, x30  Narrow row, BTB, x30  ER shoulder extension, BTB, x30 (patient reporting mild discomfort in R shoulder for last reps)  Patient educated throughout session on appropriate technique and form using multi-modal cueing, HEP, and activity modification.   Patient Response to interventions: Patient reporting 0/10 shoulder pain, 7/10 knee pain.  ASSESSMENT Patient presents to clinic to participate in therapy with decreased foot clearance during gait in clinic. Patient demonstrates deficits in BLE strength, pain, function, balance, gait, and posture. Patient still presenting with increased fatigue  and decreased tolerance to standing activity during today's session but responded positively to seated postural interventions. Patient will benefit from continued skilled therapeutic intervention to address remaining deficits in BLE strength, pain, function, balance, gait, and posture in order to decrease risk of fall, increase function, and improve overall QOL.    PT Long Term Goals - 06/05/19 1009      PT LONG TERM GOAL #1   Title  Patient will be independent with HEP in order to improve strength and balance in order to decrease fall risk and improve function at home and work.    Baseline  IE: not initiated    Time  4    Period  Weeks    Status  Achieved      PT LONG TERM GOAL #2   Title  Patient will improve ABC by at least 13% in order to demonstrate clinically significant improvement in balance confidence.    Baseline  IE: 69.69%; 10/14: 80%    Time  6    Period  Weeks    Target Date  07/17/19      PT LONG TERM GOAL #3   Title  Patient will decrease 5TSTS by at least 3 seconds and perform from a standard height chair in order to demonstrate clinically significant improvement in LE strength.    Baseline  IE: 14.3 sec from an elevated surface (2 foam pads); 10/14: 15.8 sec from elevated surface (1 foam pad)    Time  6    Period  Weeks    Status  Partially Met    Target Date  07/17/19      PT LONG TERM GOAL #4   Title  Patient will increase 6MWT by at least 56m(1680f in order to demonstrate clinically significant improvement in cardiopulmonary endurance and community ambulation.    Baseline  IE: 624 feet; 10/14: 840 feet    Time  6    Period  Weeks    Status  Partially Met    Target Date  07/17/19            Plan - 07/08/19 1308    Clinical Impression Statement  Patient presents to clinic to participate in therapy with decreased foot clearance during gait in clinic. Patient demonstrates deficits in BLE strength, pain, function, balance, gait, and posture. Patient still  presenting with increased fatigue and decreased tolerance to standing activity during today's session but responded positively to seated postural interventions. Patient will benefit from continued skilled therapeutic intervention to address remaining deficits in BLE strength, pain, function, balance, gait, and posture in order to decrease risk of fall, increase function, and improve overall QOL.    Personal Factors and Comorbidities  Age;Behavior Pattern;Comorbidity 3+;Fitness;Past/Current Experience;Time since onset of injury/illness/exacerbation    Comorbidities  CKD Stage 3, HLD,  HTN, DM, hx of seizures, depression    Examination-Activity Limitations  Squat;Bend;Locomotion Level;Stairs;Bathing;Lift;Transfers;Carry    Examination-Participation Restrictions  Shop;Cleaning;Meal Prep;Yard Work;Community Activity;Interpersonal Relationship    Stability/Clinical Decision Making  Evolving/Moderate complexity    Rehab Potential  Fair    PT Frequency  2x / week    PT Duration  6 weeks    PT Treatment/Interventions  ADLs/Self Care Home Management;Aquatic Therapy;Cryotherapy;Moist Heat;Stair training;Gait training;DME Instruction;Functional mobility training;Therapeutic activities;Therapeutic exercise;Balance training;Patient/family education;Neuromuscular re-education;Manual techniques;Energy conservation;Taping    PT Next Visit Plan  BLE strengthening, graded activity    PT Home Exercise Plan  Seated and standing hip strengthening    Consulted and Agree with Plan of Care  Patient       Patient will benefit from skilled therapeutic intervention in order to improve the following deficits and impairments:  Abnormal gait, Decreased balance, Decreased endurance, Decreased mobility, Difficulty walking, Hypomobility, Pain, Postural dysfunction, Decreased strength, Decreased activity tolerance, Decreased range of motion, Improper body mechanics  Visit Diagnosis: Muscle weakness (generalized)  Difficulty in  walking, not elsewhere classified  Repeated falls     Problem List Patient Active Problem List   Diagnosis Date Noted  . Olecranon bursitis of right elbow 12/17/2018  . Advanced care planning/counseling discussion 09/22/2018  . Weakness of both lower extremities 09/22/2018  . History of hepatitis 01/05/2018  . 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 NAFLD (nonalcoholic fatty liver disease) 09/30/2017  . Obesity, Class I, BMI 30.0-34.9 (see actual BMI) 09/18/2017  . Polyarthralgia 02/15/2017  . OSA (obstructive sleep apnea) 02/15/2017  . Partial epilepsy with impairment of consciousness ([place]) 01/29/2016  . Constipation 07/13/2015  . Low vitamin B12 level 05/27/2015  . Elevated PSA 05/27/2015  . Vitamin D deficiency 03/02/2015  . CKD stage 3 due to type 2 diabetes mellitus ([place])   . Other long term (current) drug therapy 03/12/2012  . Health maintenance examination 04/26/2011  . TOTAL KNEE REPLACEMENT, LEFT, HX OF 04/07/2010  . Controlled type 2 diabetes mellitus with diabetic nephropathy ([place]) 03/30/2010  . Hyperlipidemia 03/30/2010  . 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 Essential hypertension, benign 03/30/2010   Myles Gip PT, DPT [phone]  07/08/2019, 1:59 PM  [facility] [address] [place], [place], [address] Phone: [phone]   Fax:  [phone]  Name: Kirk Bennett. MRN: [id number] Date of Birth: Dec 24, 1950

## 2019-07-10 ENCOUNTER — Ambulatory Visit: Payer: 59 | Admitting: Physical Therapy

## 2019-07-10 ENCOUNTER — Other Ambulatory Visit: Payer: Self-pay

## 2019-07-10 ENCOUNTER — Encounter: Payer: Self-pay | Admitting: Physical Therapy

## 2019-07-10 DIAGNOSIS — M6281 Muscle weakness (generalized): Secondary | ICD-10-CM

## 2019-07-10 DIAGNOSIS — R296 Repeated falls: Secondary | ICD-10-CM

## 2019-07-10 DIAGNOSIS — R262 Difficulty in walking, not elsewhere classified: Secondary | ICD-10-CM

## 2019-07-10 NOTE — Therapy (Signed)
[facility] [address] [place], [place], [address] Phone: [phone]   Fax:  [phone]  Physical Therapy Treatment  Patient Details  Name: Kirk Bennett. MRN: [id number] Date of Birth: 1951-08-15 Referring Provider (PT): Driscilla Moats, MD   Encounter Date: 07/10/2019  PT End of Session - 07/10/19 1106    Visit Number  19    Number of Visits  21    Date for PT Re-Evaluation  07/17/19    Authorization Type  PN 06/05/2019    PT Start Time  1059    PT Stop Time  1140    PT Time Calculation (min)  41 min    Equipment Utilized During Treatment  Gait belt    Activity Tolerance  Patient tolerated treatment well;Patient limited by fatigue;Patient limited by pain    Behavior During Therapy  WFL for tasks assessed/performed       Past Medical History:  Diagnosis Date  . Cataracts, bilateral 02/2011   and suspected glaucoma, to return for f/u, no diabetic retinopathy  . CKD stage 3 due to type 2 diabetes mellitus ([place]) 2015   normal renal US, self referred to Dr Holley Raring  . 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 Mora Bellman at [facility])  . Depression    found by neuro  . Elevated PSA 05/27/2015   Serial monitoring ([place])   . Glaucoma 2015   suspect  . History of hepatitis A   . HLD (hyperlipidemia)   . HTN (hypertension)   . Knee pain    s/p replacement  . SVT (supraventricular tachycardia) ([place]) 1996  . Vitamin D deficiency 03/02/2015  . 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 Gabriel Carina endo --> 02/2016 decided to return to PCP for DM care    Past Surgical History:  Procedure Laterality Date  . CARDIAC CATHETERIZATION  July 2010   No blockages (Dr. Liliane Shi)  . COLONOSCOPY  09/16/2005   hyperplastic polyps, rpt due 10 yrs   . COLONOSCOPY WITH PROPOFOL N/A 12/11/2015    mult polyps, few TA, rpt 15yr ([place])  . corrective surgery amblyopia  1957  . Cystectomy or meningioma removal brain  1990   (unclear)  . Left knee surgery  1971   Torn ACL  . REPLACEMENT TOTAL KNEE  April 2011   [facility]    There were no vitals filed for this visit.  Subjective Assessment - 07/10/19 1104    Subjective  Patient presents to clinic stating that he feels good today. He reports his knees feel fine. He had no shoulder soreness after last session and does not report any falls.    Currently in Pain?  Yes    Pain Location  Generalized       TREATMENT Therapeutic Exercise: NuStep, L3-5, seat 15, x10 min, SPM target 70-80 for warm-up and leg strengthening TRX supported 20" chair squat x10, 17" chair squat 2x5. VCs for hip hinge and weight transfer  Neuromuscular Re-education: Tandem walking x6 laps in // bars, VCs and TCs as needed for postural corrections and hip extension, faded UE support, SBA Tandem walking on airex x6 laps in // bars, VCs and TCs as needed for postural corrections and hip extension, faded UE support, SBA Lateral walking on airex x4 laps in // bars, VCs and TCs as needed for  postural corrections and hip extension, faded UE support, SBA Seated postural strengthening against resistance:  Wide rows, BTB x30  Lat pull down, BTB, x30  Narrow row, BTB, x30  ER shoulder extension, BTB, x30 (patient reporting mild discomfort in R shoulder for last reps)  Patient educated throughout session on appropriate technique and form using multi-modal cueing, HEP, and activity modification.   Patient Response to interventions: Patient reporting fatigue.  ASSESSMENT Patient presents to clinic to participate in therapy with decreased foot clearance during gait in clinic. Patient demonstrates deficits in BLE strength, pain, function, balance, gait, and posture. Patient tolerating increased standing activity with decreased seated rest breaks during  today's session and responded positively to all active interventions. Patient will benefit from continued skilled therapeutic intervention to address remaining deficits in BLE strength, pain, function, balance, gait, and posture in order to decrease risk of fall, increase function, and improve overall QOL.    PT Long Term Goals - 06/05/19 1009      PT LONG TERM GOAL #1   Title  Patient will be independent with HEP in order to improve strength and balance in order to decrease fall risk and improve function at home and work.    Baseline  IE: not initiated    Time  4    Period  Weeks    Status  Achieved      PT LONG TERM GOAL #2   Title  Patient will improve ABC by at least 13% in order to demonstrate clinically significant improvement in balance confidence.    Baseline  IE: 69.69%; 10/14: 80%    Time  6    Period  Weeks    Target Date  07/17/19      PT LONG TERM GOAL #3   Title  Patient will decrease 5TSTS by at least 3 seconds and perform from a standard height chair in order to demonstrate clinically significant improvement in LE strength.    Baseline  IE: 14.3 sec from an elevated surface (2 foam pads); 10/14: 15.8 sec from elevated surface (1 foam pad)    Time  6    Period  Weeks    Status  Partially Met    Target Date  07/17/19      PT LONG TERM GOAL #4   Title  Patient will increase 6MWT by at least 20m(1626f in order to demonstrate clinically significant improvement in cardiopulmonary endurance and community ambulation.    Baseline  IE: 624 feet; 10/14: 840 feet    Time  6    Period  Weeks    Status  Partially Met    Target Date  07/17/19            Plan - 07/10/19 1107    Clinical Impression Statement  Patient presents to clinic to participate in therapy with decreased foot clearance during gait in clinic. Patient demonstrates deficits in BLE strength, pain, function, balance, gait, and posture. Patient tolerating increased standing activity with decreased seated  rest breaks during today's session and responded positively to all active interventions. Patient will benefit from continued skilled therapeutic intervention to address remaining deficits in BLE strength, pain, function, balance, gait, and posture in order to decrease risk of fall, increase function, and improve overall QOL.    Personal Factors and Comorbidities  Age;Behavior Pattern;Comorbidity 3+;Fitness;Past/Current Experience;Time since onset of injury/illness/exacerbation    Comorbidities  CKD Stage 3, HLD, HTN, DM, hx of seizures, depression    Examination-Activity Limitations  Squat;Bend;Locomotion Level;Stairs;Bathing;Lift;Transfers;Carry  Examination-Participation Restrictions  Shop;Cleaning;Meal Prep;Yard Work;Community Activity;Interpersonal Relationship    Stability/Clinical Decision Making  Evolving/Moderate complexity    Rehab Potential  Fair    PT Frequency  2x / week    PT Duration  6 weeks    PT Treatment/Interventions  ADLs/Self Care Home Management;Aquatic Therapy;Cryotherapy;Moist Heat;Stair training;Gait training;DME Instruction;Functional mobility training;Therapeutic activities;Therapeutic exercise;Balance training;Patient/family education;Neuromuscular re-education;Manual techniques;Energy conservation;Taping    PT Next Visit Plan  BLE strengthening, graded activity    PT Home Exercise Plan  Seated and standing hip strengthening    Consulted and Agree with Plan of Care  Patient       Patient will benefit from skilled therapeutic intervention in order to improve the following deficits and impairments:  Abnormal gait, Decreased balance, Decreased endurance, Decreased mobility, Difficulty walking, Hypomobility, Pain, Postural dysfunction, Decreased strength, Decreased activity tolerance, Decreased range of motion, Improper body mechanics  Visit Diagnosis: Muscle weakness (generalized)  Difficulty in walking, not elsewhere classified  Repeated falls     Problem  List Patient Active Problem List   Diagnosis Date Noted  . Olecranon bursitis of right elbow 12/17/2018  . Advanced care planning/counseling discussion 09/22/2018  . Weakness of both lower extremities 09/22/2018  . History of hepatitis 01/05/2018  . 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 NAFLD (nonalcoholic fatty liver disease) 09/30/2017  . Obesity, Class I, BMI 30.0-34.9 (see actual BMI) 09/18/2017  . Polyarthralgia 02/15/2017  . OSA (obstructive sleep apnea) 02/15/2017  . Partial epilepsy with impairment of consciousness ([place]) 01/29/2016  . Constipation 07/13/2015  . Low vitamin B12 level 05/27/2015  . Elevated PSA 05/27/2015  . Vitamin D deficiency 03/02/2015  . CKD stage 3 due to type 2 diabetes mellitus ([place])   . Other long term (current) drug therapy 03/12/2012  . Health maintenance examination 04/26/2011  . TOTAL KNEE REPLACEMENT, LEFT, HX OF 04/07/2010  . Controlled type 2 diabetes mellitus with diabetic nephropathy ([place]) 03/30/2010  . Hyperlipidemia 03/30/2010  . 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 Essential hypertension, benign 03/30/2010   Myles Gip PT, DPT [phone] 07/10/2019, 3:31 PM  [facility] [address] [place], [place], [address] Phone: [phone]   Fax:  [phone]  Name: [name] MRN: [id number] Date of Birth: 06/02/1951

## 2019-07-15 ENCOUNTER — Encounter: Payer: Self-pay | Admitting: Physical Therapy

## 2019-07-15 ENCOUNTER — Ambulatory Visit: Payer: 59 | Admitting: Physical Therapy

## 2019-07-15 ENCOUNTER — Other Ambulatory Visit: Payer: Self-pay

## 2019-07-15 DIAGNOSIS — M6281 Muscle weakness (generalized): Secondary | ICD-10-CM | POA: Diagnosis not present

## 2019-07-15 DIAGNOSIS — R296 Repeated falls: Secondary | ICD-10-CM

## 2019-07-15 DIAGNOSIS — R262 Difficulty in walking, not elsewhere classified: Secondary | ICD-10-CM

## 2019-07-15 NOTE — Therapy (Signed)
[facility] [address] [place], [place], [address] Phone: [phone]   Fax:  [phone]  Physical Therapy Treatment/ Physical Therapy Progress Note   Dates of reporting period  06/05/2019   to   07/15/2019   Patient Details  Name: Kirk Bennett. MRN: [id number] Date of Birth: 28-Jan-1951 Referring Provider (PT): Driscilla Moats, MD   Encounter Date: 07/15/2019  PT End of Session - 07/15/19 1105    Visit Number  20    Number of Visits  21    Date for PT Re-Evaluation  07/17/19    Authorization Type  PN 06/05/2019    PT Start Time  1100    PT Stop Time  1140    PT Time Calculation (min)  40 min    Equipment Utilized During Treatment  Gait belt    Activity Tolerance  Patient tolerated treatment well    Behavior During Therapy  WFL for tasks assessed/performed       Past Medical History:  Diagnosis Date  . Cataracts, bilateral 02/2011   and suspected glaucoma, to return for f/u, no diabetic retinopathy  . CKD stage 3 due to type 2 diabetes mellitus ([place]) 2015   normal renal US, self referred to Dr Holley Raring  . 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 Mora Bellman at [facility])  . Depression    found by neuro  . Elevated PSA 05/27/2015   Serial monitoring ([place])   . Glaucoma 2015   suspect  . History of hepatitis A   . HLD (hyperlipidemia)   . HTN (hypertension)   . Knee pain    s/p replacement  . SVT (supraventricular tachycardia) ([place]) 1996  . Vitamin D deficiency 03/02/2015  . 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 Gabriel Carina endo --> 02/2016 decided to return to PCP for DM care    Past Surgical History:  Procedure Laterality Date  . CARDIAC CATHETERIZATION  July 2010   No blockages (Dr. Liliane Shi)  . COLONOSCOPY  09/16/2005   hyperplastic polyps, rpt due 10 yrs    . COLONOSCOPY WITH PROPOFOL N/A 12/11/2015   mult polyps, few TA, rpt 47yr ([place])  . corrective surgery amblyopia  1957  . Cystectomy or meningioma removal brain  1990   (unclear)  . Left knee surgery  1971   Torn ACL  . REPLACEMENT TOTAL KNEE  April 2011   [facility]    There were no vitals filed for this visit.  Subjective Assessment - 07/15/19 1105    Subjective  Patient notes he's feeling good today. He states that he is aware of his knees but they don't hurt. Patient states that he feels he has made progress and is feeling confident to self-manage.    Currently in Pain?  No/denies       TREATMENT Therapeutic Exercise: NuStep, L3-5, seat 15, x5 min, SPM target 70-80 for warm-up and leg strengthening Reassessed goals: see below. 5TSTS, standard chair, 19.6 sec 6MWT: 1000 feet, IND, no indication of LOB or unsteadiness, consistent foot clearance Review HEP and importance of consistent exercise for maintenance of progress.    Patient educated throughout session on appropriate technique and form using multi-modal cueing, HEP, and activity modification.   Patient Response to interventions: Patient: I think I'm ready to graduate.  ASSESSMENT Patient presents to clinic  to participate in therapy with decreased foot clearance during gait in clinic. Patient demonstrates deficits in BLE strength, pain, function, balance, gait, and posture. Patient indicating progress on 5TSTS and 6MWT goals and maintained ABC improvement during today's session and responded positively to all active interventions. Patient is appropriate for a trial self-management period, and he may benefit from continued skilled therapeutic intervention to address minimal remaining deficits in BLE strength, pain, function, balance, gait, and posture in order to decrease risk of fall, increase function, and improve overall QOL.     PT Long Term Goals - 07/15/19 1106      PT LONG TERM GOAL #1    Title  Patient will be independent with HEP in order to improve strength and balance in order to decrease fall risk and improve function at home and work.    Baseline  IE: not initiated 11/23: IND    Time  4    Period  Weeks    Status  Achieved      PT LONG TERM GOAL #2   Title  Patient will improve ABC by at least 13% in order to demonstrate clinically significant improvement in balance confidence.    Baseline  IE: 69.69%; 10/14: 80%; 11/23: 80% (possible ceiling effect)    Time  6    Period  Weeks    Status  Partially Met      PT LONG TERM GOAL #3   Title  Patient will decrease 5TSTS by at least 3 seconds and perform from a standard height chair in order to demonstrate clinically significant improvement in LE strength.    Baseline  IE: 14.3 sec from an elevated surface (2 foam pads); 10/14: 15.8 sec from elevated surface (1 foam pad); 11/25: 19.6 sec (no foam pad)    Time  6    Period  Weeks    Status  Partially Met      PT LONG TERM GOAL #4   Title  Patient will increase 6MWT by at least 2m(1673f in order to demonstrate clinically significant improvement in cardiopulmonary endurance and community ambulation.    Baseline  IE: 624 feet; 10/14: 840 feet; 11/23: 1000 feet    Time  6    Period  Weeks    Status  Achieved            Plan - 07/15/19 1212    Clinical Impression Statement  Patient presents to clinic to participate in therapy with decreased foot clearance during gait in clinic. Patient demonstrates deficits in BLE strength, pain, function, balance, gait, and posture. Patient indicating progress on 5TSTS and 6MWT goals and maintained ABC improvement during today's session and responded positively to all active interventions. Patient is appropriate for a trial self-management period, and he may benefit from continued skilled therapeutic intervention to address minimal remaining deficits in BLE strength, pain, function, balance, gait, and posture in order to decrease risk  of fall, increase function, and improve overall QOL.    Personal Factors and Comorbidities  Age;Behavior Pattern;Comorbidity 3+;Fitness;Past/Current Experience;Time since onset of injury/illness/exacerbation    Comorbidities  CKD Stage 3, HLD, HTN, DM, hx of seizures, depression    Examination-Activity Limitations  Squat;Bend;Locomotion Level;Stairs;Bathing;Lift;Transfers;Carry    Examination-Participation Restrictions  Shop;Cleaning;Meal Prep;Yard Work;Community Activity;Interpersonal Relationship    Stability/Clinical Decision Making  Evolving/Moderate complexity    Rehab Potential  Fair    PT Frequency  2x / week    PT Duration  6 weeks    PT Treatment/Interventions  ADLs/Self Care  Home Management;Aquatic Therapy;Cryotherapy;Moist Heat;Stair training;Gait training;DME Instruction;Functional mobility training;Therapeutic activities;Therapeutic exercise;Balance training;Patient/family education;Neuromuscular re-education;Manual techniques;Energy conservation;Taping    Consulted and Agree with Plan of Care  Patient       Patient will benefit from skilled therapeutic intervention in order to improve the following deficits and impairments:  Abnormal gait, Decreased balance, Decreased endurance, Decreased mobility, Difficulty walking, Hypomobility, Pain, Postural dysfunction, Decreased strength, Decreased activity tolerance, Decreased range of motion, Improper body mechanics  Visit Diagnosis: Muscle weakness (generalized)  Difficulty in walking, not elsewhere classified  Repeated falls     Problem List Patient Active Problem List   Diagnosis Date Noted  . Olecranon bursitis of right elbow 12/17/2018  . Advanced care planning/counseling discussion 09/22/2018  . Weakness of both lower extremities 09/22/2018  . History of hepatitis 01/05/2018  . 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 NAFLD (nonalcoholic fatty liver disease) 09/30/2017  . Obesity, Class I, BMI 30.0-34.9 (see  actual BMI) 09/18/2017  . Polyarthralgia 02/15/2017  . OSA (obstructive sleep apnea) 02/15/2017  . Partial epilepsy with impairment of consciousness ([place]) 01/29/2016  . Constipation 07/13/2015  . Low vitamin B12 level 05/27/2015  . Elevated PSA 05/27/2015  . Vitamin D deficiency 03/02/2015  . CKD stage 3 due to type 2 diabetes mellitus ([place])   . Other long term (current) drug therapy 03/12/2012  . Health maintenance examination 04/26/2011  . TOTAL KNEE REPLACEMENT, LEFT, HX OF 04/07/2010  . Controlled type 2 diabetes mellitus with diabetic nephropathy ([place]) 03/30/2010  . Hyperlipidemia 03/30/2010  . 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 Essential hypertension, benign 03/30/2010   Myles Gip PT, DPT [phone] 07/15/2019, 12:23 PM  [facility] [address] [place], [place], [address] Phone: [phone]   Fax:  [phone]  Name: Kirk Bennett. MRN: [id number] Date of Birth: 01-01-1951

## 2019-07-17 ENCOUNTER — Encounter: Payer: Self-pay | Admitting: Physical Therapy

## 2019-09-19 ENCOUNTER — Other Ambulatory Visit: Payer: Self-pay | Admitting: Family Medicine

## 2019-09-19 DIAGNOSIS — R972 Elevated prostate specific antigen [PSA]: Secondary | ICD-10-CM

## 2019-09-19 DIAGNOSIS — N183 Chronic kidney disease, stage 3 unspecified: Secondary | ICD-10-CM

## 2019-09-19 DIAGNOSIS — E559 Vitamin D deficiency, unspecified: Secondary | ICD-10-CM

## 2019-09-19 DIAGNOSIS — E1121 Type 2 diabetes mellitus with diabetic nephropathy: Secondary | ICD-10-CM

## 2019-09-19 DIAGNOSIS — E785 Hyperlipidemia, unspecified: Secondary | ICD-10-CM

## 2019-09-19 DIAGNOSIS — E538 Deficiency of other specified B group vitamins: Secondary | ICD-10-CM

## 2019-09-19 DIAGNOSIS — E1122 Type 2 diabetes mellitus with diabetic chronic kidney disease: Secondary | ICD-10-CM

## 2019-09-20 ENCOUNTER — Other Ambulatory Visit: Payer: Self-pay

## 2019-09-20 ENCOUNTER — Other Ambulatory Visit (INDEPENDENT_AMBULATORY_CARE_PROVIDER_SITE_OTHER): Payer: 59

## 2019-09-20 ENCOUNTER — Other Ambulatory Visit: Payer: 59

## 2019-09-20 DIAGNOSIS — N183 Chronic kidney disease, stage 3 unspecified: Secondary | ICD-10-CM | POA: Diagnosis not present

## 2019-09-20 DIAGNOSIS — R972 Elevated prostate specific antigen [PSA]: Secondary | ICD-10-CM

## 2019-09-20 DIAGNOSIS — E1121 Type 2 diabetes mellitus with diabetic nephropathy: Secondary | ICD-10-CM

## 2019-09-20 DIAGNOSIS — E559 Vitamin D deficiency, unspecified: Secondary | ICD-10-CM

## 2019-09-20 DIAGNOSIS — E538 Deficiency of other specified B group vitamins: Secondary | ICD-10-CM

## 2019-09-20 DIAGNOSIS — E1122 Type 2 diabetes mellitus with diabetic chronic kidney disease: Secondary | ICD-10-CM

## 2019-09-20 NOTE — Addendum Note (Signed)
Addended by: Ellamae Sia on: 09/20/2019 10:49 AM   Modules accepted: Orders

## 2019-09-23 LAB — CBC WITH DIFFERENTIAL/PLATELET
Absolute Monocytes: 371 cells/uL (ref 200–950)
Basophils Absolute: 29 cells/uL (ref 0–200)
Basophils Relative: 0.5 %
Eosinophils Absolute: 359 cells/uL (ref 15–500)
Eosinophils Relative: 6.3 %
HCT: 43.7 % (ref 38.5–50.0)
Hemoglobin: 14.3 g/dL (ref 13.2–17.1)
Lymphs Abs: 1807 cells/uL (ref 850–3900)
MCH: 27.8 pg (ref 27.0–33.0)
MCHC: 32.7 g/dL (ref 32.0–36.0)
MCV: 84.9 fL (ref 80.0–100.0)
MPV: 12.2 fL (ref 7.5–12.5)
Monocytes Relative: 6.5 %
Neutro Abs: 3135 cells/uL (ref 1500–7800)
Neutrophils Relative %: 55 %
Platelets: 208 10*3/uL (ref 140–400)
RBC: 5.15 10*6/uL (ref 4.20–5.80)
RDW: 11.6 % (ref 11.0–15.0)
Total Lymphocyte: 31.7 %
WBC: 5.7 10*3/uL (ref 3.8–10.8)

## 2019-09-23 LAB — COMPREHENSIVE METABOLIC PANEL
AG Ratio: 1.6 (calc) (ref 1.0–2.5)
ALT: 12 U/L (ref 9–46)
AST: 12 U/L (ref 10–35)
Albumin: 4.2 g/dL (ref 3.6–5.1)
Alkaline phosphatase (APISO): 95 U/L (ref 35–144)
BUN/Creatinine Ratio: 12 (calc) (ref 6–22)
BUN: 18 mg/dL (ref 7–25)
CO2: 24 mmol/L (ref 20–32)
Calcium: 9.7 mg/dL (ref 8.6–10.3)
Chloride: 106 mmol/L (ref 98–110)
Creat: 1.5 mg/dL — ABNORMAL HIGH (ref 0.70–1.25)
Globulin: 2.6 g/dL (calc) (ref 1.9–3.7)
Glucose, Bld: 118 mg/dL — ABNORMAL HIGH (ref 65–99)
Potassium: 4.3 mmol/L (ref 3.5–5.3)
Sodium: 142 mmol/L (ref 135–146)
Total Bilirubin: 0.8 mg/dL (ref 0.2–1.2)
Total Protein: 6.8 g/dL (ref 6.1–8.1)

## 2019-09-23 LAB — LIPID PANEL
Cholesterol: 131 mg/dL (ref <200)
HDL: 32 mg/dL — ABNORMAL LOW (ref >=40)
LDL Cholesterol (Calc): 82 mg/dL (calc)
Non-HDL Cholesterol (Calc): 99 mg/dL (calc) (ref <130)
Total CHOL/HDL Ratio: 4.1 (calc) (ref <5.0)
Triglycerides: 90 mg/dL (ref <150)

## 2019-09-23 LAB — PSA: PSA: 1.6 ng/mL (ref <=4.0)

## 2019-09-23 LAB — VITAMIN D 25 HYDROXY (VIT D DEFICIENCY, FRACTURES): Vit D, 25-Hydroxy: 32 ng/mL (ref 30–100)

## 2019-09-23 LAB — HEMOGLOBIN A1C
Hgb A1c MFr Bld: 6.3 % of total Hgb — ABNORMAL HIGH (ref <5.7)
Mean Plasma Glucose: 134 (calc)
eAG (mmol/L): 7.4 (calc)

## 2019-09-23 LAB — VITAMIN B12: Vitamin B-12: 1674 pg/mL — ABNORMAL HIGH (ref 200–1100)

## 2019-09-23 LAB — PARATHYROID HORMONE, INTACT (NO CA): PTH: 44 pg/mL (ref 14–64)

## 2019-09-26 ENCOUNTER — Other Ambulatory Visit: Payer: Self-pay

## 2019-09-26 ENCOUNTER — Encounter: Payer: Self-pay | Admitting: Family Medicine

## 2019-09-26 ENCOUNTER — Ambulatory Visit (INDEPENDENT_AMBULATORY_CARE_PROVIDER_SITE_OTHER): Payer: 59 | Admitting: Family Medicine

## 2019-09-26 VITALS — BP 124/68 | HR 78 | Temp 97.8°F | Ht 73.5 in | Wt 245.5 lb

## 2019-09-26 DIAGNOSIS — Z0000 Encounter for general adult medical examination without abnormal findings: Secondary | ICD-10-CM

## 2019-09-26 DIAGNOSIS — R972 Elevated prostate specific antigen [PSA]: Secondary | ICD-10-CM

## 2019-09-26 DIAGNOSIS — E669 Obesity, unspecified: Secondary | ICD-10-CM

## 2019-09-26 DIAGNOSIS — E538 Deficiency of other specified B group vitamins: Secondary | ICD-10-CM

## 2019-09-26 DIAGNOSIS — E559 Vitamin D deficiency, unspecified: Secondary | ICD-10-CM

## 2019-09-26 DIAGNOSIS — R4589 Other symptoms and signs involving emotional state: Secondary | ICD-10-CM

## 2019-09-26 DIAGNOSIS — E785 Hyperlipidemia, unspecified: Secondary | ICD-10-CM

## 2019-09-26 DIAGNOSIS — E1121 Type 2 diabetes mellitus with diabetic nephropathy: Secondary | ICD-10-CM | POA: Diagnosis not present

## 2019-09-26 DIAGNOSIS — E1169 Type 2 diabetes mellitus with other specified complication: Secondary | ICD-10-CM

## 2019-09-26 DIAGNOSIS — E1122 Type 2 diabetes mellitus with diabetic chronic kidney disease: Secondary | ICD-10-CM

## 2019-09-26 DIAGNOSIS — G40209 Localization-related (focal) (partial) symptomatic epilepsy and epileptic syndromes with complex partial seizures, not intractable, without status epilepticus: Secondary | ICD-10-CM

## 2019-09-26 DIAGNOSIS — Z7189 Other specified counseling: Secondary | ICD-10-CM

## 2019-09-26 DIAGNOSIS — R29898 Other symptoms and signs involving the musculoskeletal system: Secondary | ICD-10-CM

## 2019-09-26 DIAGNOSIS — N3941 Urge incontinence: Secondary | ICD-10-CM | POA: Insufficient documentation

## 2019-09-26 DIAGNOSIS — I10 Essential (primary) hypertension: Secondary | ICD-10-CM

## 2019-09-26 DIAGNOSIS — N183 Chronic kidney disease, stage 3 unspecified: Secondary | ICD-10-CM

## 2019-09-26 DIAGNOSIS — K760 Fatty (change of) liver, not elsewhere classified: Secondary | ICD-10-CM

## 2019-09-26 LAB — POC URINALSYSI DIPSTICK (AUTOMATED)
Bilirubin, UA: NEGATIVE
Blood, UA: NEGATIVE
Glucose, UA: POSITIVE — AB
Ketones, UA: NEGATIVE
Leukocytes, UA: NEGATIVE
Nitrite, UA: NEGATIVE
Protein, UA: NEGATIVE
Spec Grav, UA: 1.025 (ref 1.010–1.025)
Urobilinogen, UA: 0.2 E.U./dL
pH, UA: 5.5 (ref 5.0–8.0)

## 2019-09-26 NOTE — Assessment & Plan Note (Addendum)
Chronic, stable. Continue current regimen (metformin, amaryl, farxiga).

## 2019-09-26 NOTE — Assessment & Plan Note (Addendum)
Chronic, stable stage 3 CKD. eGFR = 60

## 2019-09-26 NOTE — Assessment & Plan Note (Signed)
Chronic, continue lipitor. The 10-year ASCVD risk score Mikey Bussing DC Brooke Bonito., et al., 2013) is: 28.1%   Values used to calculate the score:     Age: 69 years     Sex: Male     Is Non-Hispanic African American: Yes     Diabetic: Yes     Tobacco smoker: No     Systolic Blood Pressure: [id number] mmHg     Is BP treated: Yes     HDL Cholesterol: 32 mg/dL     Total Cholesterol: 131 mg/dL

## 2019-09-26 NOTE — Assessment & Plan Note (Signed)
Preventative protocols reviewed and updated unless pt declined. Discussed healthy diet and lifestyle.  

## 2019-09-26 NOTE — Assessment & Plan Note (Signed)
Encourage regular exercise routine (has stationary bicycle at home).

## 2019-09-26 NOTE — Assessment & Plan Note (Signed)
Check UA today.  Symptoms present for the last few months, has been on farxiga for 1+ years. Consider taper. Declines medication for this.

## 2019-09-26 NOTE — Assessment & Plan Note (Addendum)
Benefited from PT course last year.

## 2019-09-26 NOTE — Progress Notes (Signed)
This visit was conducted in person.  BP 124/68 (BP Location: Left Arm, Patient Position: Sitting, Cuff Size: Normal)   Pulse 78   Temp 97.8 F (36.6 C) (Temporal)   Ht 6' 1.5" (1.867 m)   Wt 245 lb 8 oz (111.4 kg)   SpO2 96%   BMI 31.95 kg/m    CC: CPE Subjective:    Patient ID: Kirk Buttery., male    DOB: 1951/01/11, 69 y.o.   MRN: [id number]  HPI: Kirk Hartel. is a 69 y.o. male presenting on 09/26/2019 for Annual Exam   Has Medicare part A.   Preventative: COLONOSCOPY WITH PROPOFOL 12/11/2015 mult polyps, few TA, rpt 69yr ([place]) - postponed due to pandemic.  Prostate cancer screening -PSA increased to 4.4 last year, referred to urology with stable DRE and decreased PSA back to normal range in interim. Rec yearly monitoringat PCP's office.  Flu shot yearly Tdap -04/2011  Pneumovax 2013, prevnar 04/2018  Shingrix - discussed  Moderna covid 08/2019, pending second vaccine  Had hepatitis at age 10yo(?A), hospitalized [facility], [place].  No hep B in past.  shingrix - interested - will await completing covid vaccine.  Advanced directive: has this at home. Wife is HCPOA. Asked to bring [place].  Seat belt use discussed. Sunscreen use discussed. R groin mole enlarging.  Non smoker Alcohol - none  Dentist q6 mo  Eye exam yearly  Bowel - no constipation Bladder - urge incontinence noted in the last year - uses depends. Occasional full uncontrollable emptying. Avoids bladder irritants. + foot on floor and key in door phenomenon. No stress incontinence symptoms.   Lives with wife and granddaughter, 1 cat Occupation: retired, worked aPhysicist, medical Activity: uses stationary bicycle at home  Diet: good water, good vegetables, follows diabetic diet     Relevant past medical, surgical, family and social history reviewed and updated as indicated. Interim medical history since our last visit reviewed. Allergies and medications reviewed and updated. Outpatient  Medications Prior to Visit  Medication Sig Dispense Refill  . atorvastatin (LIPITOR) 20 MG tablet Take 1 tablet (20 mg total) by mouth daily. 90 tablet 3  . Cholecalciferol (VITAMIN D3) 1000 units CAPS Take 1,000 Units by mouth daily.    . dapagliflozin propanediol (FARXIGA) 10 MG TABS tablet TAKE 1 TABLET (10 MG TOTAL) BY MOUTH EVERY MORNING. 90 tablet 3  . glimepiride (AMARYL) 2 MG tablet Take 1 tablet (2 mg total) by mouth daily with breakfast. 90 tablet 3  . glucose blood (ONE TOUCH ULTRA TEST) test strip Use to check sugar fasting in the AM and 2 hours after lunch or supper. Dx: E11.21 100 each 3  . levETIRAcetam (KEPPRA) 500 MG tablet Take 500 mg by mouth as directed. 2 tab AM, 3 tab PM    . lisinopril (PRINIVIL,ZESTRIL) 10 MG tablet Take 1 tablet (10 mg total) by mouth 2 (two) times daily. 180 tablet 3  . metFORMIN (GLUCOPHAGE) 1000 MG tablet TAKE 1 TABLET BY MOUTH TWICE A DAY WITH A MEAL 180 tablet 3  . tamsulosin (FLOMAX) 0.4 MG CAPS capsule Take 1 capsule (0.4 mg total) by mouth daily. 90 capsule 3   Facility-Administered Medications Prior to Visit  Medication Dose Route Frequency Provider Last Rate Last Admin  . triamcinolone acetonide (KENALOG) 10 MG/ML injection 10 mg  10 mg Other Once Kirk Bennett DPM         Per HPI unless specifically indicated in ROS section below Review  of Systems  Constitutional: Negative for activity change, appetite change, chills, fatigue, fever and unexpected weight change.  HENT: Negative for hearing loss.   Eyes: Negative for visual disturbance.  Respiratory: Negative for cough, chest tightness, shortness of breath and wheezing.   Cardiovascular: Negative for chest pain, palpitations and leg swelling.  Gastrointestinal: Negative for abdominal distention, abdominal pain, blood in stool, constipation, diarrhea, nausea and vomiting.  Genitourinary: Negative for difficulty urinating and hematuria.  Musculoskeletal: Negative for arthralgias, myalgias  and neck pain.  Skin: Negative for rash.  Neurological: Negative for dizziness, seizures, syncope and headaches.  Hematological: Negative for adenopathy. Does not bruise/bleed easily.  Psychiatric/Behavioral: Positive for dysphoric mood. The patient is not nervous/anxious.    Objective:    BP 124/68 (BP Location: Left Arm, Patient Position: Sitting, Cuff Size: Normal)   Pulse 78   Temp 97.8 F (36.6 C) (Temporal)   Ht 6' 1.5" (1.867 m)   Wt 245 lb 8 oz (111.4 kg)   SpO2 96%   BMI 31.95 kg/m   Wt Readings from Last 3 Encounters:  09/26/19 245 lb 8 oz (111.4 kg)  01/25/19 248 lb 8 oz (112.7 kg)  12/17/18 249 lb 7 oz (113.1 kg)    Physical Exam Vitals and nursing note reviewed.  Constitutional:      General: He is not in acute distress.    Appearance: Normal appearance. He is well-developed. He is not ill-appearing.  HENT:     Head: Normocephalic and atraumatic.     Right Ear: Hearing, tympanic membrane, ear canal and external ear normal.     Left Ear: Hearing, tympanic membrane, ear canal and external ear normal.     Mouth/Throat:     Pharynx: Uvula midline.  Eyes:     General: No scleral icterus.    Extraocular Movements: Extraocular movements intact.     Conjunctiva/sclera: Conjunctivae normal.     Pupils: Pupils are equal, round, and reactive to light.  Cardiovascular:     Rate and Rhythm: Normal rate and regular rhythm.     Pulses: Normal pulses.          Radial pulses are 2+ on the right side and 2+ on the left side.     Heart sounds: Normal heart sounds. No murmur.  Pulmonary:     Effort: Pulmonary effort is normal. No respiratory distress.     Breath sounds: Normal breath sounds. No wheezing, rhonchi or rales.  Abdominal:     General: Abdomen is flat. Bowel sounds are normal. There is no distension.     Palpations: Abdomen is soft. There is no mass.     Tenderness: There is no abdominal tenderness. There is no right CVA tenderness, left CVA tenderness, guarding  or rebound.     Hernia: No hernia is present.  Genitourinary:    Prostate: Enlarged (mild - 25gm). Not tender and no nodules present.     Rectum: Normal. No mass, tenderness, anal fissure, external hemorrhoid or internal hemorrhoid. Normal anal tone.  Musculoskeletal:        General: Normal range of motion.     Cervical back: Normal range of motion and neck supple.     Right lower leg: No edema.     Left lower leg: No edema.     Comments: 2+ DP bilaterally  Lymphadenopathy:     Cervical: No cervical adenopathy.  Skin:    General: Skin is warm and dry.     Findings: No rash.  Comments: Benign skin tag R inner thigh  Neurological:     General: No focal deficit present.     Mental Status: He is alert and oriented to person, place, and time.     Comments: CN grossly intact, station and gait intact  Psychiatric:        Mood and Affect: Mood normal.        Behavior: Behavior normal.        Thought Content: Thought content normal.        Judgment: Judgment normal.       Results for orders placed or performed in visit on 09/26/19  POCT Urinalysis Dipstick (Automated)  Result Value Ref Range   Color, UA yellow    Clarity, UA clear    Glucose, UA Positive (A) Negative   Bilirubin, UA negative    Ketones, UA negative    Spec Grav, UA 1.025 1.010 - 1.025   Blood, UA negative    pH, UA 5.5 5.0 - 8.0   Protein, UA Negative Negative   Urobilinogen, UA 0.2 0.2 or 1.0 E.U./dL   Nitrite, UA negative    Leukocytes, UA Negative Negative   Assessment & Plan:  This visit occurred during the SARS-CoV-2 public health emergency.  Safety protocols were in place, including screening questions prior to the visit, additional usage of staff PPE, and extensive cleaning of exam room while observing appropriate contact time as indicated for disinfecting solutions.   Problem List Items Addressed This Visit    Weakness of both lower extremities    Benefited from PT course last year.        Vitamin D deficiency    Continue 1000 IU vit D      Urge incontinence    Check UA today.  Symptoms present for the last few months, has been on farxiga for 1+ years. Consider taper. Declines medication for this.       Partial epilepsy with impairment of consciousness ([place])    Continue keppra.       Obesity, Class I, BMI 30.0-34.9 (see actual BMI)    Encourage regular exercise routine (has stationary bicycle at home).       NAFLD (nonalcoholic fatty liver disease)    Mild by [place] 09/2017, LFTS normal today.       Low vitamin B12 level    Levels high - will recommend decreasing vitamin supplement to MWF.       Hyperlipidemia associated with type 2 diabetes mellitus (HCC)    Chronic, continue lipitor. The 10-year ASCVD risk score Kirk Bussing DC Brooke Bonito., et al., 2013) is: 28.1%   Values used to calculate the score:     Age: 66 years     Sex: Male     Is Non-Hispanic African American: Yes     Diabetic: Yes     Tobacco smoker: No     Systolic Blood Pressure: 709 mmHg     Is BP treated: Yes     HDL Cholesterol: 32 mg/dL     Total Cholesterol: 131 mg/dL       Health maintenance examination - Primary    Preventative protocols reviewed and updated unless pt declined. Discussed healthy diet and lifestyle.       Relevant Orders   POCT Urinalysis Dipstick (Automated) (Completed)   Essential hypertension, benign    Chronic, stable. Continue current regimen.       Elevated PSA    DRE/PSA reassuring today. Mild BPH, continue flomax.  Depressed mood    Difficult year. Encouraged stress relieving strategies. He will let me know if feeling overwhelmed.       Controlled type 2 diabetes mellitus with diabetic nephropathy (HCC)    Chronic, stable. Continue current regimen (metformin, amaryl, farxiga).       CKD stage 3 due to type 2 diabetes mellitus (HCC)    Chronic, stable stage 3 CKD. eGFR = 60      Advanced care planning/counseling discussion    Advanced directive: has this  at home. Wife is HCPOA. Asked to bring [place] copy.           No orders of the defined types were placed in this encounter.  Orders Placed This Encounter  Procedures  . POCT Urinalysis Dipstick (Automated)    Patient instructions: Call us when ready for shingrix series - to schedule nurse visit.  Bring [place] copy of your advanced directives.  Urinalysis today.  Decrease frequency of vitamin B12 tablets (to M-W-F) as your levels were high.  Good to see you today Return as needed or in 3-4 months for follow up diabetes  Follow up plan: Return in about 4 months (around 01/24/2020) for follow up visit.  Ria Bush, MD

## 2019-09-26 NOTE — Assessment & Plan Note (Addendum)
Difficult year. Encouraged stress relieving strategies. He will let me know if feeling overwhelmed.

## 2019-09-26 NOTE — Assessment & Plan Note (Signed)
Levels high - will recommend decreasing vitamin supplement to MWF.

## 2019-09-26 NOTE — Assessment & Plan Note (Signed)
Chronic, stable. Continue current regimen. 

## 2019-09-26 NOTE — Patient Instructions (Addendum)
Call us when ready for shingrix series - to schedule nurse visit.  Bring [place] copy of your advanced directives.  Urinalysis today.  Decrease frequency of vitamin B12 tablets (to M-W-F) as your levels were high.  Good to see you today Return as needed or in 3-4 months for follow up diabetes  Health Maintenance After Age 67 After age 55, you are at a higher risk for certain long-term diseases and infections as well as injuries from falls. Falls are a major cause of broken bones and head injuries in people who are older than age 67. Getting regular preventive care can help to keep you healthy and well. Preventive care includes getting regular testing and making lifestyle changes as recommended by your health care provider. Talk with your health care provider about:  Which screenings and tests you should have. A screening is a test that checks for a disease when you have no symptoms.  A diet and exercise plan that is right for you. What should I know about screenings and tests to prevent falls? Screening and testing are the best ways to find a health problem early. Early diagnosis and treatment give you the best chance of managing medical conditions that are common after age 40. Certain conditions and lifestyle choices may make you more likely to have a fall. Your health care provider may recommend:  Regular vision checks. Poor vision and conditions such as cataracts can make you more likely to have a fall. If you wear glasses, make sure to get your prescription updated if your vision changes.  Medicine review. Work with your health care provider to regularly review all of the medicines you are taking, including over-the-counter medicines. Ask your health care provider about any side effects that may make you more likely to have a fall. Tell your health care provider if any medicines that you take make you feel dizzy or sleepy.  Osteoporosis screening. Osteoporosis is a condition that causes the bones  to get weaker. This can make the bones weak and cause them to break more easily.  Blood pressure screening. Blood pressure changes and medicines to control blood pressure can make you feel dizzy.  Strength and balance checks. Your health care provider may recommend certain tests to check your strength and balance while standing, walking, or changing positions.  Foot health exam. Foot pain and numbness, as well as not wearing proper footwear, can make you more likely to have a fall.  Depression screening. You may be more likely to have a fall if you have a fear of falling, feel emotionally low, or feel unable to do activities that you used to do.  Alcohol use screening. Using too much alcohol can affect your balance and may make you more likely to have a fall. What actions can I take to lower my risk of falls? General instructions  Talk with your health care provider about your risks for falling. Tell your health care provider if: ? You fall. Be sure to tell your health care provider about all falls, even ones that seem minor. ? You feel dizzy, sleepy, or off-balance.  Take over-the-counter and prescription medicines only as told by your health care provider. These include any supplements.  Eat a healthy diet and maintain a healthy weight. A healthy diet includes low-fat dairy products, low-fat (lean) meats, and fiber from whole grains, beans, and lots of fruits and vegetables. Home safety  Remove any tripping hazards, such as rugs, cords, and clutter.  Install safety equipment  such as grab bars in bathrooms and safety rails on stairs.  Keep rooms and walkways well-lit. Activity   Follow a regular exercise program to stay fit. This will help you maintain your balance. Ask your health care provider what types of exercise are appropriate for you.  If you need a cane or walker, use it as recommended by your health care provider.  Wear supportive shoes that have nonskid  soles. Lifestyle  Do not drink alcohol if your health care provider tells you not to drink.  If you drink alcohol, limit how much you have: ? 0-1 drink a day for women. ? 0-2 drinks a day for men.  Be aware of how much alcohol is in your drink. In the [place] one drink equals one typical bottle of beer (12 oz), one-half glass of wine (5 oz), or one shot of hard liquor (1 oz).  Do not use any products that contain nicotine or tobacco, such as cigarettes and e-cigarettes. If you need help quitting, ask your health care provider. Summary  Having a healthy lifestyle and getting preventive care can help to protect your health and wellness after age 71.  Screening and testing are the best way to find a health problem early and help you avoid having a fall. Early diagnosis and treatment give you the best chance for managing medical conditions that are more common for people who are older than age 52.  Falls are a major cause of broken bones and head injuries in people who are older than age 18. Take precautions to prevent a fall at home.  Work with your health care provider to learn what changes you can make to improve your health and wellness and to prevent falls. This information is not intended to replace advice given to you by your health care provider. Make sure you discuss any questions you have with your health care provider. Document Revised: 11/29/2018 Document Reviewed: 06/21/2017 Elsevier Patient Education  2020 [employer].

## 2019-09-26 NOTE — Assessment & Plan Note (Signed)
Advanced directive: has this at home. Wife is HCPOA. Asked to bring [place] copy.

## 2019-09-26 NOTE — Assessment & Plan Note (Signed)
Continue 1000 IU vit D

## 2019-09-26 NOTE — Assessment & Plan Note (Signed)
Mild by [place] 09/2017, LFTS normal today.

## 2019-09-26 NOTE — Assessment & Plan Note (Signed)
Continue keppra

## 2019-09-26 NOTE — Assessment & Plan Note (Addendum)
DRE/PSA reassuring today. Mild BPH, continue flomax.

## 2019-09-29 ENCOUNTER — Encounter: Payer: Self-pay | Admitting: Family Medicine

## 2019-10-05 ENCOUNTER — Other Ambulatory Visit: Payer: Self-pay | Admitting: Family Medicine

## 2019-10-13 ENCOUNTER — Encounter: Payer: Self-pay | Admitting: Emergency Medicine

## 2019-10-13 ENCOUNTER — Emergency Department: Payer: 59

## 2019-10-13 ENCOUNTER — Other Ambulatory Visit: Payer: Self-pay

## 2019-10-13 ENCOUNTER — Observation Stay
Admission: EM | Admit: 2019-10-13 | Discharge: 2019-10-14 | Disposition: A | Discharge status: Home / Self Care | Payer: 59 | Attending: Internal Medicine | Admitting: Internal Medicine

## 2019-10-13 DIAGNOSIS — R42 Dizziness and giddiness: Secondary | ICD-10-CM | POA: Diagnosis not present

## 2019-10-13 DIAGNOSIS — N183 Chronic kidney disease, stage 3 unspecified: Secondary | ICD-10-CM | POA: Diagnosis not present

## 2019-10-13 DIAGNOSIS — K760 Fatty (change of) liver, not elsewhere classified: Secondary | ICD-10-CM | POA: Diagnosis present

## 2019-10-13 DIAGNOSIS — Z6831 Body mass index (BMI) 31.0-31.9, adult: Secondary | ICD-10-CM | POA: Insufficient documentation

## 2019-10-13 DIAGNOSIS — G9389 Other specified disorders of brain: Secondary | ICD-10-CM | POA: Diagnosis not present

## 2019-10-13 DIAGNOSIS — Z833 Family history of diabetes mellitus: Secondary | ICD-10-CM | POA: Insufficient documentation

## 2019-10-13 DIAGNOSIS — Z79899 Other long term (current) drug therapy: Secondary | ICD-10-CM | POA: Diagnosis not present

## 2019-10-13 DIAGNOSIS — R2689 Other abnormalities of gait and mobility: Secondary | ICD-10-CM

## 2019-10-13 DIAGNOSIS — R55 Syncope and collapse: Secondary | ICD-10-CM | POA: Insufficient documentation

## 2019-10-13 DIAGNOSIS — G40109 Localization-related (focal) (partial) symptomatic epilepsy and epileptic syndromes with simple partial seizures, not intractable, without status epilepticus: Secondary | ICD-10-CM | POA: Diagnosis not present

## 2019-10-13 DIAGNOSIS — R531 Weakness: Principal | ICD-10-CM

## 2019-10-13 DIAGNOSIS — E1122 Type 2 diabetes mellitus with diabetic chronic kidney disease: Secondary | ICD-10-CM | POA: Diagnosis present

## 2019-10-13 DIAGNOSIS — E669 Obesity, unspecified: Secondary | ICD-10-CM | POA: Insufficient documentation

## 2019-10-13 DIAGNOSIS — I10 Essential (primary) hypertension: Secondary | ICD-10-CM | POA: Diagnosis present

## 2019-10-13 DIAGNOSIS — I493 Ventricular premature depolarization: Secondary | ICD-10-CM

## 2019-10-13 DIAGNOSIS — E785 Hyperlipidemia, unspecified: Secondary | ICD-10-CM | POA: Insufficient documentation

## 2019-10-13 DIAGNOSIS — G4733 Obstructive sleep apnea (adult) (pediatric): Secondary | ICD-10-CM | POA: Diagnosis not present

## 2019-10-13 DIAGNOSIS — W07XXXA Fall from chair, initial encounter: Secondary | ICD-10-CM | POA: Insufficient documentation

## 2019-10-13 DIAGNOSIS — E1121 Type 2 diabetes mellitus with diabetic nephropathy: Secondary | ICD-10-CM | POA: Diagnosis not present

## 2019-10-13 DIAGNOSIS — Z20822 Contact with and (suspected) exposure to covid-19: Secondary | ICD-10-CM | POA: Insufficient documentation

## 2019-10-13 DIAGNOSIS — I1310 Hypertensive heart and chronic kidney disease without heart failure, with stage 1 through stage 4 chronic kidney disease, or unspecified chronic kidney disease: Secondary | ICD-10-CM | POA: Insufficient documentation

## 2019-10-13 DIAGNOSIS — Z8673 Personal history of transient ischemic attack (TIA), and cerebral infarction without residual deficits: Secondary | ICD-10-CM | POA: Insufficient documentation

## 2019-10-13 DIAGNOSIS — I6782 Cerebral ischemia: Secondary | ICD-10-CM | POA: Insufficient documentation

## 2019-10-13 DIAGNOSIS — Z7984 Long term (current) use of oral hypoglycemic drugs: Secondary | ICD-10-CM | POA: Diagnosis not present

## 2019-10-13 LAB — CBC WITH DIFFERENTIAL/PLATELET
Abs Immature Granulocytes: 0.04 10*3/uL (ref 0.00–0.07)
Basophils Absolute: 0.1 10*3/uL (ref 0.0–0.1)
Basophils Relative: 1 %
Eosinophils Absolute: 0.3 10*3/uL (ref 0.0–0.5)
Eosinophils Relative: 3 %
HCT: 44.4 % (ref 39.0–52.0)
Hemoglobin: 14.5 g/dL (ref 13.0–17.0)
Immature Granulocytes: 1 %
Lymphocytes Relative: 21 %
Lymphs Abs: 1.8 10*3/uL (ref 0.7–4.0)
MCH: 27.9 pg (ref 26.0–34.0)
MCHC: 32.7 g/dL (ref 30.0–36.0)
MCV: 85.5 fL (ref 80.0–100.0)
Monocytes Absolute: 1.1 10*3/uL — ABNORMAL HIGH (ref 0.1–1.0)
Monocytes Relative: 13 %
Neutro Abs: 5.5 10*3/uL (ref 1.7–7.7)
Neutrophils Relative %: 61 %
Platelets: 218 10*3/uL (ref 150–400)
RBC: 5.19 MIL/uL (ref 4.22–5.81)
RDW: 12 % (ref 11.5–15.5)
WBC: 8.7 10*3/uL (ref 4.0–10.5)
nRBC: 0 % (ref 0.0–0.2)

## 2019-10-13 LAB — COMPREHENSIVE METABOLIC PANEL
ALT: 15 U/L (ref 0–44)
AST: 18 U/L (ref 15–41)
Albumin: 3.7 g/dL (ref 3.5–5.0)
Alkaline Phosphatase: 99 U/L (ref 38–126)
Anion gap: 11 (ref 5–15)
BUN: 18 mg/dL (ref 8–23)
CO2: 23 mmol/L (ref 22–32)
Calcium: 9.3 mg/dL (ref 8.9–10.3)
Chloride: 108 mmol/L (ref 98–111)
Creatinine, Ser: 1.81 mg/dL — ABNORMAL HIGH (ref 0.61–1.24)
GFR calc Af Amer: 44 mL/min — ABNORMAL LOW (ref >60)
GFR calc non Af Amer: 38 mL/min — ABNORMAL LOW (ref >60)
Glucose, Bld: 172 mg/dL — ABNORMAL HIGH (ref 70–99)
Potassium: 4.3 mmol/L (ref 3.5–5.1)
Sodium: 142 mmol/L (ref 135–145)
Total Bilirubin: 0.9 mg/dL (ref 0.3–1.2)
Total Protein: 7.3 g/dL (ref 6.5–8.1)

## 2019-10-13 LAB — URINALYSIS, COMPLETE (UACMP) WITH MICROSCOPIC
Bacteria, UA: NONE SEEN
Bilirubin Urine: NEGATIVE
Glucose, UA: >=500 mg/dL — AB
Hgb urine dipstick: NEGATIVE
Ketones, ur: 5 mg/dL — AB
Leukocytes,Ua: NEGATIVE
Nitrite: NEGATIVE
Protein, ur: NEGATIVE mg/dL
Specific Gravity, Urine: 1.017 (ref 1.005–1.030)
pH: 5 (ref 5.0–8.0)

## 2019-10-13 LAB — BRAIN NATRIURETIC PEPTIDE: B Natriuretic Peptide: 26 pg/mL (ref 0.0–100.0)

## 2019-10-13 LAB — TROPONIN I (HIGH SENSITIVITY): Troponin I (High Sensitivity): 3 ng/L (ref <18)

## 2019-10-13 LAB — MAGNESIUM: Magnesium: 1.7 mg/dL (ref 1.7–2.4)

## 2019-10-13 LAB — CK: Total CK: 84 U/L (ref 49–397)

## 2019-10-13 MED ORDER — INSULIN ASPART 100 UNIT/ML ~~LOC~~ SOLN: 0.0000 [IU] | Freq: Every day | SUBCUTANEOUS | Status: DC

## 2019-10-13 MED ORDER — ACETAMINOPHEN 325 MG PO TABS: 650.0000 mg | ORAL_TABLET | Freq: Four times a day (QID) | ORAL | Status: DC | PRN

## 2019-10-13 MED ORDER — INSULIN ASPART 100 UNIT/ML ~~LOC~~ SOLN
0.0000 [IU] | Freq: Three times a day (TID) | SUBCUTANEOUS | Status: DC
  Administered 2019-10-14: 10:00:00 2 [IU] via SUBCUTANEOUS
  Filled 2019-10-13: qty 1

## 2019-10-13 MED ORDER — ONDANSETRON HCL 4 MG/2ML IJ SOLN: 4.0000 mg | Freq: Four times a day (QID) | INTRAMUSCULAR | Status: DC | PRN

## 2019-10-13 MED ORDER — ALUM & MAG HYDROXIDE-SIMETH 200-200-20 MG/5ML PO SUSP: 30.0000 mL | Freq: Four times a day (QID) | ORAL | Status: DC | PRN

## 2019-10-13 MED ORDER — ENOXAPARIN SODIUM 40 MG/0.4ML ~~LOC~~ SOLN
40.0000 mg | SUBCUTANEOUS | Status: DC
  Administered 2019-10-14: 40 mg via SUBCUTANEOUS
  Filled 2019-10-13: qty 0.4

## 2019-10-13 MED ORDER — SENNOSIDES-DOCUSATE SODIUM 8.6-50 MG PO TABS: 1.0000 | ORAL_TABLET | Freq: Every evening | ORAL | Status: DC | PRN

## 2019-10-13 MED ORDER — ACETAMINOPHEN 650 MG RE SUPP: 650.0000 mg | Freq: Four times a day (QID) | RECTAL | Status: DC | PRN

## 2019-10-13 MED ORDER — SODIUM CHLORIDE 0.9 % IV BOLUS
1000.0000 mL | Freq: Once | INTRAVENOUS | Status: AC
  Administered 2019-10-13: 22:00:00 1000 mL via INTRAVENOUS

## 2019-10-13 MED ORDER — SODIUM CHLORIDE 0.9% FLUSH
3.0000 mL | Freq: Two times a day (BID) | INTRAVENOUS | Status: DC
  Administered 2019-10-14: 02:00:00 3 mL via INTRAVENOUS

## 2019-10-13 MED ORDER — LEVETIRACETAM 500 MG PO TABS
1000.0000 mg | ORAL_TABLET | Freq: Every day | ORAL | Status: DC
  Administered 2019-10-14: 10:00:00 1000 mg via ORAL
  Filled 2019-10-13: qty 2

## 2019-10-13 MED ORDER — LISINOPRIL 10 MG PO TABS
10.0000 mg | ORAL_TABLET | Freq: Two times a day (BID) | ORAL | Status: DC
  Administered 2019-10-14 (×2): 10 mg via ORAL
  Filled 2019-10-13 (×2): qty 1

## 2019-10-13 MED ORDER — SODIUM CHLORIDE 0.9 % IV SOLN: INTRAVENOUS | Status: DC

## 2019-10-13 MED ORDER — ATORVASTATIN CALCIUM 20 MG PO TABS: 20.0000 mg | ORAL_TABLET | Freq: Every day | ORAL | Status: DC

## 2019-10-13 MED ORDER — ONDANSETRON HCL 4 MG PO TABS: 4.0000 mg | ORAL_TABLET | Freq: Four times a day (QID) | ORAL | Status: DC | PRN

## 2019-10-13 MED ORDER — TAMSULOSIN HCL 0.4 MG PO CAPS
0.4000 mg | ORAL_CAPSULE | Freq: Every day | ORAL | Status: DC
  Administered 2019-10-14: 10:00:00 0.4 mg via ORAL
  Filled 2019-10-13: qty 1

## 2019-10-13 MED ORDER — MAGNESIUM OXIDE 400 (241.3 MG) MG PO TABS
800.0000 mg | ORAL_TABLET | Freq: Once | ORAL | Status: AC
  Administered 2019-10-14: 02:00:00 800 mg via ORAL
  Filled 2019-10-13: qty 2

## 2019-10-13 NOTE — ED Notes (Signed)
Pt is resting bed, denies any further needs at this time. Pt neighbor at bedside currently.

## 2019-10-13 NOTE — ED Triage Notes (Signed)
Pt arrived via EMS from home, reports increased generalized weakness. States that he got his 2nd covid shot. Pt wife states that he tried to get out of his chair tonight and fell backwards, and slid down to the floor and unable to get off of floor. Pt gait is unsteady and he is off balance but pt states that he doesn't feel off balance.

## 2019-10-13 NOTE — H&P (Signed)
History and Physical    Kirk Bennett. [id number] DOB: 1951-06-11 DOA: 10/13/2019  PCP: Ria Bush, MD   Patient coming from: home  I have personally briefly reviewed patient's old medical records in [place]  Chief Complaint: generalized weakness, fall  HPI: Kirk Bennett. is a 69 y.o. male with medical history significant for diabetes, CKD 3, hypertension, partial epilepsy, NAFLD,who was brought to the emergency room after an episode of weakness as he tried to get up out of a chair, falling backwards into the chair and sliding down to the floor with difficulty getting up. Patient has history of Complex partial seizures described in neurology notes as episodes of body stiffening with maintained awareness and patient states that this did not feel like one.  Records also show a history of  bilateral lower extremity weakness, with gait impairment completing a course of physical therapy in November 2020.  He reports several similar episodes to the episode tonight over the last few months.  He says he is always fully aware during the episodes and feels as if his legs give out.  He denied preceding chest pain, palpitations, lightheadedness, blurred vision, weakness numbness or tingling in the face or on one side.  Patient states he got his second Covid shot a few days prior.  He denies muscle aches or pains, headache, fever chills cough or shortness of breath.  ED Course: On arrival in the emergency room he was tachycardic at 118 with otherwise normal vitals.  He had multiple ventricular premature complexes on telemetry.  EKG showed normal sinus rhythm but with multiple ventricular premature complexes.  His blood work troponin was 3, BNP 26 and otherwise unremarkable with normal potassium, and magnesium at lower limit of normal at 1.7.Marland Kitchen  Chest x-ray showed no active disease. Review of Systems: As per HPI otherwise 10 point review of systems negative.    Past Medical History:    Diagnosis Date  . Cataracts, bilateral 02/2011   and suspected glaucoma, to return for f/u, no diabetic retinopathy  . CKD stage 3 due to type 2 diabetes mellitus ([place]) 2015   normal renal US, self referred to Dr Holley Raring  . 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 Mora Bellman at [facility])  . Depression    found by neuro  . Elevated PSA 05/27/2015   Serial monitoring ([place])   . Glaucoma 2015   suspect  . History of hepatitis A   . HLD (hyperlipidemia)   . HTN (hypertension)   . Knee pain    s/p replacement  . SVT (supraventricular tachycardia) ([place]) 1996  . Vitamin D deficiency 03/02/2015  . 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 Gabriel Carina endo --> 02/2016 decided to return to PCP for DM care    Past Surgical History:  Procedure Laterality Date  . CARDIAC CATHETERIZATION  July 2010   No blockages (Dr. Liliane Shi)  . COLONOSCOPY  09/16/2005   hyperplastic polyps, rpt due 10 yrs   . COLONOSCOPY WITH PROPOFOL N/A 12/11/2015   mult polyps, few TA, rpt 32yrs ([place])  . corrective surgery amblyopia  1957  . Cystectomy or meningioma removal brain  1990   (unclear)  . Left knee surgery  1971   Torn ACL  . REPLACEMENT TOTAL KNEE  April 2011   Left [facility] Dr. Garald Balding)  reports that he has never smoked. He has never used smokeless tobacco. He reports that he does not drink alcohol or use drugs.  No Known Allergies  Family History  Problem Relation Age of Onset  . Heart failure Father   . Cancer Mother        Lung, smoker  . Aortic dissection Mother        Aortic rupture  . Mental illness Sister        61 years old  . Diabetes Maternal Aunt   . Diabetes Cousin   . Cancer Cousin        Colon age 31  . CAD Neg Hx      Prior to Admission medications   Medication Sig Start Date End Date Taking? Authorizing Provider   atorvastatin (LIPITOR) 20 MG tablet TAKE 1 TABLET BY MOUTH EVERY DAY 10/07/19   Ria Bush, MD  Cholecalciferol (VITAMIN D3) 1000 units CAPS Take 1,000 Units by mouth daily.    [provider]  dapagliflozin propanediol (FARXIGA) 10 MG TABS tablet TAKE 1 TABLET (10 MG TOTAL) BY MOUTH EVERY MORNING. 09/21/18   Ria Bush, MD  glimepiride (AMARYL) 2 MG tablet TAKE 1 TABLET BY MOUTH DAILY WITH BREAKFAST 10/07/19   Ria Bush, MD  glucose blood (ONE TOUCH ULTRA TEST) test strip Use to check sugar fasting in the AM and 2 hours after lunch or supper. Dx: E11.21 03/28/16   Ria Bush, MD  levETIRAcetam (KEPPRA) 500 MG tablet Take 500 mg by mouth as directed. 2 tab AM, 3 tab PM    [provider]  lisinopril (ZESTRIL) 10 MG tablet TAKE 1 TABLET BY MOUTH TWICE A DAY 10/07/19   Ria Bush, MD  metFORMIN (GLUCOPHAGE) 1000 MG tablet TAKE 1 TABLET BY MOUTH TWICE A DAY WITH A MEAL 09/21/18   Ria Bush, MD  tamsulosin (FLOMAX) 0.4 MG CAPS capsule TAKE 1 CAPSULE BY MOUTH EVERY DAY 10/07/19   Ria Bush, MD    Physical Exam: Vitals:   10/13/19 2130 10/13/19 2200 10/13/19 2230 10/13/19 2300  BP: 132/61 121/79 137/87 136/82  Pulse: (!) 108 (!) 103 90 84  Resp: 19 (!) 24  (!) 21  Temp:      TempSrc:      SpO2: 97% 97% 99% 99%  Weight:      Height:         Vitals:   10/13/19 2130 10/13/19 2200 10/13/19 2230 10/13/19 2300  BP: 132/61 121/79 137/87 136/82  Pulse: (!) 108 (!) 103 90 84  Resp: 19 (!) 24  (!) 21  Temp:      TempSrc:      SpO2: 97% 97% 99% 99%  Weight:      Height:        Constitutional: NAD, alert and oriented x 3 Eyes: PERRL, lids and conjunctivae normal ENMT: Mucous membranes are moist.  Neck: normal, supple, no masses, no thyromegaly Respiratory: clear to auscultation bilaterally, no wheezing, no crackles. Normal respiratory effort. No accessory muscle use.  Cardiovascular: Regular rate and rhythm, no murmurs / rubs /  gallops. No extremity edema. 2+ pedal pulses. No carotid bruits.  Abdomen: no tenderness, no masses palpated. No hepatosplenomegaly. Bowel sounds positive.  Musculoskeletal: no clubbing / cyanosis. No joint deformity upper and lower extremities.  Skin: no rashes, lesions, ulcers.  Neurologic: No gross focal neurologic deficit. Psychiatric: Normal mood and affect.   Labs on Admission: I have personally reviewed following labs and imaging studies  CBC: Recent Labs  Lab 10/13/19 2100  WBC 8.7  NEUTROABS 5.5  HGB 14.5  HCT 44.4  MCV 85.5  PLT 99991111   Basic Metabolic Panel: Recent Labs  Lab 10/13/19 2100  NA 142  K 4.3  CL 108  CO2 23  GLUCOSE 172*  BUN 18  CREATININE 1.81*  CALCIUM 9.3  MG 1.7   GFR: Estimated Creatinine Clearance: 53.1 mL/min (A) (by C-G formula based on SCr of 1.81 mg/dL (H)). Liver Function Tests: Recent Labs  Lab 10/13/19 2100  AST 18  ALT 15  ALKPHOS 99  BILITOT 0.9  PROT 7.3  ALBUMIN 3.7   No results for input(s): LIPASE, AMYLASE in the last 168 hours. No results for input(s): AMMONIA in the last 168 hours. Coagulation Profile: No results for input(s): INR, PROTIME in the last 168 hours. Cardiac Enzymes: Recent Labs  Lab 10/13/19 2100  CKTOTAL 84   BNP (last 3 results) No results for input(s): PROBNP in the last 8760 hours. HbA1C: No results for input(s): HGBA1C in the last 72 hours. CBG: No results for input(s): GLUCAP in the last 168 hours. Lipid Profile: No results for input(s): CHOL, HDL, LDLCALC, TRIG, CHOLHDL, LDLDIRECT in the last 72 hours. Thyroid Function Tests: No results for input(s): TSH, T4TOTAL, FREET4, T3FREE, THYROIDAB in the last 72 hours. Anemia Panel: No results for input(s): VITAMINB12, FOLATE, FERRITIN, TIBC, IRON, RETICCTPCT in the last 72 hours. Urine analysis:    Component Value Date/Time   COLORURINE YELLOW (A) 10/13/2019 2241   APPEARANCEUR CLEAR (A) 10/13/2019 2241   LABSPEC 1.017 10/13/2019 2241    PHURINE 5.0 10/13/2019 2241   GLUCOSEU >=500 (A) 10/13/2019 2241   HGBUR NEGATIVE 10/13/2019 2241   BILIRUBINUR NEGATIVE 10/13/2019 2241   BILIRUBINUR negative 09/26/2019 0928   KETONESUR 5 (A) 10/13/2019 2241   PROTEINUR NEGATIVE 10/13/2019 2241   UROBILINOGEN 0.2 09/26/2019 0928   NITRITE NEGATIVE 10/13/2019 2241   LEUKOCYTESUR NEGATIVE 10/13/2019 2241    Radiological Exams on Admission: DG Chest Port 1 View  Result Date: 10/13/2019 CLINICAL DATA:  Generalized weakness, fell EXAM: PORTABLE CHEST 1 VIEW COMPARISON:  01/20/2012 FINDINGS: The heart size and mediastinal contours are within normal limits. Both lungs are clear. The visualized skeletal structures are unremarkable. IMPRESSION: No active disease. Electronically Signed   By: Randa Ngo M.D.   On: 10/13/2019 21:52    EKG: Independently reviewed.   Assessment/Plan Principal Problem:   Possible Postural dizziness with presyncope of unclear etiology   Frequent ventricular premature complexes   Possible seizure   History of gait imbalance -Etiology unclear.  Possible cardiac arrhythmia, given frequent ventricular premature complexes, possibility of partial seizure, possibly related to known gait disturbance, -Continue to cycle troponins to evaluate for ACS -Echocardiogram in the a.m. 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  Liver enzymes normal.  DVT prophylaxis: lovenox  Code Status: full code  Family Communication: none  Disposition Plan: Back to previous home environment Consults called: cardiology and neurology     Athena Masse MD [facility]     10/13/2019, 11:47 PM

## 2019-10-13 NOTE — ED Notes (Signed)
Pt states that he wants to leave and go home, this RN informed MD [place].

## 2019-10-13 NOTE — ED Notes (Signed)
Pt given ice water with permission from MD [facility]

## 2019-10-13 NOTE — ED Provider Notes (Signed)
[facility] Emergency Department Provider Note  ____________________________________________   First MD Initiated Contact with Patient 10/13/19 2102     (approximate)  I have reviewed the triage vital signs and the nursing notes.  History  Chief Complaint Weakness    HPI Kirk Bennett. is a 69 y.o. male past medical history as below, including DM, partial seizures, CKD who presents to the emergency department for generalized weakness and an episode of profound diaphoresis.  This afternoon he developed generalized weakness, so much so that he couldn't get out of his chair, and in fact slid to the ground (no related trauma) and couldn't get up off the floor.  His wife called his neighbor, who is an MD, who helped evaluate the patient and noted him to be profusely diaphoretic.  Checked his blood glucose which was within normal limits.  Advised evaluation in the emergency department.  On arrival to the ED he does report generalized weakness and is noted to be tachycardic with a heavy PVC burden. Reports history of "early beats". Denies any associated chest pain, palpitations, shortness of breath.  Of note, he did receive his second COVID vaccine yesterday afternoon.  Denies any symptoms related up until this afternoon when he developed the generalized weakness, but denies any associated fevers or body aches.   Past Medical Hx Past Medical History:  Diagnosis Date  . Cataracts, bilateral 02/2011   and suspected glaucoma, to return for f/u, no diabetic retinopathy  . CKD stage 3 due to type 2 diabetes mellitus ([place]) 2015   normal renal US, self referred to Dr Holley Raring  . 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 Mora Bellman at [facility])  . Depression    found by neuro  . Elevated PSA 05/27/2015   Serial monitoring ([place])   . Glaucoma 2015   suspect  . History  of hepatitis A   . HLD (hyperlipidemia)   . HTN (hypertension)   . Knee pain    s/p replacement  . SVT (supraventricular tachycardia) ([place]) 1996  . Vitamin D deficiency 03/02/2015  . 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 Gabriel Carina endo --> 02/2016 decided to return to PCP for DM care    Problem List Patient Active Problem List   Diagnosis Date Noted  . Urge urinary incontinence 09/26/2019  . Depressed mood 09/26/2019  . Olecranon bursitis of right elbow 12/17/2018  . Advanced care planning/counseling discussion 09/22/2018  . Weakness of both lower extremities 09/22/2018  . History of hepatitis 01/05/2018  . 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 NAFLD (nonalcoholic fatty liver disease) 09/30/2017  . Obesity, Class I, BMI 30.0-34.9 (see actual BMI) 09/18/2017  . Polyarthralgia 02/15/2017  . OSA (obstructive sleep apnea) 02/15/2017  . Partial epilepsy with impairment of consciousness ([place]) 01/29/2016  . Low vitamin B12 level 05/27/2015  . Elevated PSA 05/27/2015  . Vitamin D deficiency 03/02/2015  . CKD stage 3 due to type 2 diabetes mellitus ([place])   . Other long term (current) drug therapy 03/12/2012  . Health maintenance examination 04/26/2011  . TOTAL KNEE REPLACEMENT, LEFT, HX OF 04/07/2010  . Controlled type 2 diabetes mellitus with diabetic nephropathy ([place]) 03/30/2010  . Hyperlipidemia associated with type 2 diabetes mellitus ([place]) 03/30/2010  . 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 Essential hypertension, benign 03/30/2010  Past Surgical Hx Past Surgical History:  Procedure Laterality Date  . CARDIAC CATHETERIZATION  July 2010   No blockages (Dr. Liliane Shi)  . COLONOSCOPY  09/16/2005   hyperplastic polyps, rpt due 10 yrs   . COLONOSCOPY WITH PROPOFOL N/A 12/11/2015   mult polyps, few TA, rpt 33yrs ([place])  . corrective surgery amblyopia  1957  .  Cystectomy or meningioma removal brain  1990   (unclear)  . Left knee surgery  1971   Torn ACL  . REPLACEMENT TOTAL KNEE  April 2011   Left [facility] Dr. Garald Balding)    Medications Prior to Admission medications   Medication Sig Start Date End Date Taking? Authorizing Provider  atorvastatin (LIPITOR) 20 MG tablet TAKE 1 TABLET BY MOUTH EVERY DAY 10/07/19   Ria Bush, MD  Cholecalciferol (VITAMIN D3) 1000 units CAPS Take 1,000 Units by mouth daily.    [provider]  dapagliflozin propanediol (FARXIGA) 10 MG TABS tablet TAKE 1 TABLET (10 MG TOTAL) BY MOUTH EVERY MORNING. 09/21/18   Ria Bush, MD  glimepiride (AMARYL) 2 MG tablet TAKE 1 TABLET BY MOUTH DAILY WITH BREAKFAST 10/07/19   Ria Bush, MD  glucose blood (ONE TOUCH ULTRA TEST) test strip Use to check sugar fasting in the AM and 2 hours after lunch or supper. Dx: E11.21 03/28/16   Ria Bush, MD  levETIRAcetam (KEPPRA) 500 MG tablet Take 500 mg by mouth as directed. 2 tab AM, 3 tab PM    [provider]  lisinopril (ZESTRIL) 10 MG tablet TAKE 1 TABLET BY MOUTH TWICE A DAY 10/07/19   Ria Bush, MD  metFORMIN (GLUCOPHAGE) 1000 MG tablet TAKE 1 TABLET BY MOUTH TWICE A DAY WITH A MEAL 09/21/18   Ria Bush, MD  tamsulosin (FLOMAX) 0.4 MG CAPS capsule TAKE 1 CAPSULE BY MOUTH EVERY DAY 10/07/19   Ria Bush, MD    Allergies Patient has no known allergies.  Family Hx Family History  Problem Relation Age of Onset  . Heart failure Father   . Cancer Mother        Lung, smoker  . Aortic dissection Mother        Aortic rupture  . Mental illness Sister        68 years old  . Diabetes Maternal Aunt   . Diabetes Cousin   . Cancer Cousin        Colon age 4  . CAD Neg Hx     Social Hx Social History   Tobacco Use  . Smoking status: Never Smoker  . Smokeless tobacco: Never Used  Substance Use Topics  . Alcohol use: No    Alcohol/week: 0.0 standard drinks  . Drug use: No       Review of Systems  Constitutional: Negative for fever, chills.  Positive for generalized weakness. Eyes: Negative for visual changes. ENT: Negative for sore throat. Cardiovascular: Negative for chest pain. Respiratory: Negative for shortness of breath. Gastrointestinal: Negative for nausea, vomiting.  Genitourinary: Negative for dysuria. Musculoskeletal: Negative for leg swelling. Skin: Negative for rash. Neurological: Negative for headaches.   Physical Exam  Vital Signs: ED Triage Vitals  Enc Vitals Group     BP 10/13/19 2102 (!) 143/69     Pulse Rate 10/13/19 2102 (!) 118     Resp 10/13/19 2102 16     Temp 10/13/19 2102 98.3 F (36.8 C)     Temp Source 10/13/19 2102 Oral     SpO2 10/13/19 2100 96 %  Weight 10/13/19 2106 244 lb (110.7 kg)     Height 10/13/19 2106 6\' 2"  (1.88 m)     Head Circumference --      Peak Flow --      Pain Score 10/13/19 2105 9     Pain Loc --      Pain Edu? --      Excl. in [place]? --     Constitutional: Alert and oriented.  Appears somewhat fatigued. Head: Normocephalic. Atraumatic. Eyes: Conjunctivae clear. Sclera anicteric. Nose: No congestion. No rhinorrhea. Mouth/Throat: Wearing mask.  Neck: No stridor.   Cardiovascular: Tachycardic, irregular. Extremities well perfused. Respiratory: Normal respiratory effort.  Lungs CTAB. Gastrointestinal: Soft. Non-tender. Non-distended.  Musculoskeletal: No lower extremity edema. No deformities. Neurologic:  Normal speech and language. No gross focal neurologic deficits are appreciated.  Lateralizing weakness or sensory deficits. Skin: Skin is warm, dry and intact. No rash noted. Psychiatric: Mood and affect are appropriate for situation.  EKG  Personally reviewed.   Rate: 116 Rhythm: sinus Axis: LAD Intervals: WNL Multiple PVCs No STEMI  Noted to be in trigeminy on monitor w/ heavy PVC burden   Radiology  CXR: IMPRESSION:  No active disease.    Procedures  Procedure(s)  performed (including critical care):  Procedures   Initial Impression / Assessment and Plan / ED Course  69 y.o. male who presents to the ED for generalized weakness, near syncope, diaphoresis  Ddx: arrhythmia, electrolyte abnormality, dehydration, hypoglycemia, UTI, post vaccine related  Will evaluate with labs, urine studies, EKG  Electrolytes without actionable derangements.  Creatinine very mildly increased from prior 1.81 from 1.5 previously, receiving IV fluids.  Urine without evidence of infection.  CK, troponin, magnesium within normal limits.  EKG as above, noted to have heavy PVC burden on cardiac monitoring and often in trigeminy.  Given heavy PVC burden associated w/ episode of weakness and diaphoresis, will plan to admit for observation for continued telemetry monitoring to rule out any arrhythmic event.  Patient is agreeable.  Discussed with hospitalist for admission.   Final Clinical Impression(s) / ED Diagnosis  Final diagnoses:  Generalized weakness  PVC (premature ventricular contraction)       Note:  This document was prepared using Dragon voice recognition software and may include unintentional dictation errors.   Lilia Pro., MD 10/13/19 [phone]

## 2019-10-13 NOTE — ED Notes (Signed)
This RN accidentally put add on to previous collection for urinalysis and will reorder it

## 2019-10-14 ENCOUNTER — Observation Stay: Payer: 59

## 2019-10-14 ENCOUNTER — Observation Stay (HOSPITAL_BASED_OUTPATIENT_CLINIC_OR_DEPARTMENT_OTHER)
Admit: 2019-10-14 | Discharge: 2019-10-14 | Disposition: A | Discharge status: Home / Self Care | Payer: 59 | Attending: Internal Medicine | Admitting: Internal Medicine

## 2019-10-14 DIAGNOSIS — E1121 Type 2 diabetes mellitus with diabetic nephropathy: Secondary | ICD-10-CM

## 2019-10-14 DIAGNOSIS — E1122 Type 2 diabetes mellitus with diabetic chronic kidney disease: Secondary | ICD-10-CM | POA: Diagnosis not present

## 2019-10-14 DIAGNOSIS — R531 Weakness: Secondary | ICD-10-CM | POA: Diagnosis not present

## 2019-10-14 DIAGNOSIS — R2689 Other abnormalities of gait and mobility: Secondary | ICD-10-CM

## 2019-10-14 DIAGNOSIS — N183 Chronic kidney disease, stage 3 unspecified: Secondary | ICD-10-CM

## 2019-10-14 DIAGNOSIS — I10 Essential (primary) hypertension: Secondary | ICD-10-CM

## 2019-10-14 DIAGNOSIS — R55 Syncope and collapse: Secondary | ICD-10-CM

## 2019-10-14 DIAGNOSIS — I499 Cardiac arrhythmia, unspecified: Secondary | ICD-10-CM

## 2019-10-14 DIAGNOSIS — R42 Dizziness and giddiness: Secondary | ICD-10-CM | POA: Diagnosis not present

## 2019-10-14 LAB — CBC
HCT: 41.7 % (ref 39.0–52.0)
Hemoglobin: 13.4 g/dL (ref 13.0–17.0)
MCH: 27.5 pg (ref 26.0–34.0)
MCHC: 32.1 g/dL (ref 30.0–36.0)
MCV: 85.6 fL (ref 80.0–100.0)
Platelets: 153 10*3/uL (ref 150–400)
RBC: 4.87 MIL/uL (ref 4.22–5.81)
RDW: 11.8 % (ref 11.5–15.5)
WBC: 6.2 10*3/uL (ref 4.0–10.5)
nRBC: 0 % (ref 0.0–0.2)

## 2019-10-14 LAB — HEMOGLOBIN A1C
Hgb A1c MFr Bld: 6.1 % — ABNORMAL HIGH (ref 4.8–5.6)
Mean Plasma Glucose: 128.37 mg/dL

## 2019-10-14 LAB — BASIC METABOLIC PANEL
Anion gap: 9 (ref 5–15)
BUN: 18 mg/dL (ref 8–23)
CO2: 24 mmol/L (ref 22–32)
Calcium: 8.5 mg/dL — ABNORMAL LOW (ref 8.9–10.3)
Chloride: 107 mmol/L (ref 98–111)
Creatinine, Ser: 1.64 mg/dL — ABNORMAL HIGH (ref 0.61–1.24)
GFR calc Af Amer: 50 mL/min — ABNORMAL LOW (ref >60)
GFR calc non Af Amer: 43 mL/min — ABNORMAL LOW (ref >60)
Glucose, Bld: 141 mg/dL — ABNORMAL HIGH (ref 70–99)
Potassium: 3.8 mmol/L (ref 3.5–5.1)
Sodium: 140 mmol/L (ref 135–145)

## 2019-10-14 LAB — GLUCOSE, CAPILLARY
Glucose-Capillary: 151 mg/dL — ABNORMAL HIGH (ref 70–99)
Glucose-Capillary: 125 mg/dL — ABNORMAL HIGH (ref 70–99)

## 2019-10-14 LAB — TROPONIN I (HIGH SENSITIVITY): Troponin I (High Sensitivity): 4 ng/L (ref <18)

## 2019-10-14 LAB — ECHOCARDIOGRAM COMPLETE
Height: 74 in
Weight: 3982.4 oz

## 2019-10-14 LAB — TSH: TSH: 0.732 u[IU]/mL (ref 0.350–4.500)

## 2019-10-14 LAB — MAGNESIUM: Magnesium: 2 mg/dL (ref 1.7–2.4)

## 2019-10-14 LAB — SARS CORONAVIRUS 2 (TAT 6-24 HRS): SARS Coronavirus 2: NEGATIVE

## 2019-10-14 MED ORDER — LEVETIRACETAM 500 MG PO TABS
1500.0000 mg | ORAL_TABLET | Freq: Every day | ORAL | Status: DC
  Administered 2019-10-14: 03:00:00 1500 mg via ORAL
  Filled 2019-10-14: qty 3

## 2019-10-14 MED ORDER — LEVETIRACETAM 500 MG PO TABS: 1500.0000 mg | ORAL_TABLET | Freq: Every day | ORAL | Status: DC

## 2019-10-14 NOTE — Progress Notes (Signed)
Pt received from ED in NAD; is weaker than his normal status, but states he is feeling better than he was feeling earlier in the day. Pt advised not to get OOB without assistance; bed alarm activated; was assisted to bathroom and tolerated well. IVF NS infusion initiated at 100 ml/hr. Assessment is unremarkable, and pt has no complaints at this time. Given scheduled PO meds; pt requests his Keppra and Lisinopril tonight; pharmacy was notified.   Telemetry reveals NSR with PVC's. Pt denies pain.   Orders received for STAT head CT, but pt states he is refusing to go. Discussed with CT department, who has been in contact with ordering MD, Dr. Damita Dunnings; states he will notify MD. Text page also sent to Dr. Damita Dunnings by this RN.

## 2019-10-14 NOTE — Progress Notes (Signed)
Discharge instructions explained to pt/ verbalized an understanding / iv and tele removed/ transported off unit via wheelchair.  

## 2019-10-14 NOTE — Progress Notes (Signed)
*  PRELIMINARY RESULTS* Echocardiogram 2D Echocardiogram has been performed.  Kirk Bennett 10/14/2019, 11:29 AM

## 2019-10-14 NOTE — Discharge Summary (Signed)
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 Cataracts, bilateral 02/2011   and suspected glaucoma, to return for f/u, no diabetic retinopathy  . CKD stage 3 due to type 2 diabetes mellitus ([place]) 2015   normal renal US, self referred to Dr Kirk Bennett  . 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 Mora Bennett at [facility])  . Depression    found by neuro  . Elevated PSA 05/27/2015   Serial monitoring (Kirk Bennett)   . Glaucoma 2015   suspect  . History of hepatitis A   . HLD (hyperlipidemia)   . HTN (hypertension)   . Knee pain    s/p replacement  . SVT (supraventricular tachycardia) ([place]) 1996  . Vitamin D deficiency 03/02/2015  . Well controlled type 2 diabetes mellitus with nephropathy ([place]) 1996   established with Dr. Gabriel Bennett endo --> 02/2016 decided to return to PCP for DM care    HOSPITAL COURSE:  Kirk Coriell. is a 69 y.o. male with medical history significant for diabetes, CKD 3, hypertension, partial epilepsy, NAFLD,who was brought to the emergency room after an episode of weakness as he tried to get up out of a chair, falling backwards into the chair and sliding down to the floor with difficulty getting up. Possible Postural dizziness wit generalized weakness-  unclear etiology   History of gait imbalance -Etiology unclear.  Possible cardiac arrhythmia, given frequent ventricular premature complexes, possibility of partial seizure, possibly related to known gait disturbance -she was seen by cardiology. Remains in sinus rhythm. Electrolyte remained stable. Echo noted results reviewed. No further cardiac workup -carb cited neurology given history of partial simple seizures. Patient reports he has not had any seizure disorder for last four years. He follows with Dr. Opal Bennett at [facility] neurology and is doing well with Keppra current dosage. -I carb cited neurology here. No indication for EEG since patient is stable. No seizures reported. CT head essentially shows right temporal area changes due to old surgery in the past. -Patient denies any chest pain or shortness of breath -patient ambulated around the nursing station with me. Do not have any complaints. Felt very steady. He Did not want PT evaluation.  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 Patient has not had any seizure episode for last four years and does not feel this is his seizure like symptom    Essential hypertension, benign -Continue lisinopril    CKD stage 3 due to type 2 diabetes mellitus (HCC) -Creatinine at 1.81, slightly above baseline of 1.4 -received IV hydration with normal saline for 1 L  OSA (obstructive sleep apnea) -CPAP if desired    NAFLD (nonalcoholic fatty liver disease) -No acute disease apparent.  Liver enzymes normal.   overall hemodynamically stable. Patient is advised not to drive till he is seen by his neurologist as outpatient. This advice was  given by neurology here.  Patient feels well and requesting to go home.  DVT prophylaxis: lovenox  Code Status: full code  Family Communication: none  Disposition Plan: Back to previous home environment Consults called: cardiology and neurology (curbsided)   CONSULTS OBTAINED:  Treatment Team:  Kirk Kida, MD  DRUG ALLERGIES:  No Known Allergies  DISCHARGE MEDICATIONS:   Allergies as of 10/14/2019   No Known Allergies     Medication List    TAKE these medications   atorvastatin 20 MG tablet Commonly known as: LIPITOR TAKE 1 TABLET BY MOUTH EVERY DAY   dapagliflozin propanediol 10 MG Tabs tablet Commonly known as: Farxiga TAKE 1 TABLET (10 MG TOTAL) BY MOUTH EVERY MORNING.   glimepiride 2 MG tablet Commonly known as: AMARYL TAKE 1 TABLET BY MOUTH DAILY WITH BREAKFAST   glucose blood test strip Commonly known as: ONE TOUCH ULTRA TEST Use to check sugar fasting in the AM and 2 hours after lunch or supper. Dx: E11.21   levETIRAcetam 500 MG tablet Commonly known as: KEPPRA Take 500 mg by mouth as directed. 2 tab AM, 3 tab PM   lisinopril 10 MG tablet Commonly known as: ZESTRIL TAKE 1 TABLET BY MOUTH TWICE A DAY   metFORMIN 1000 MG tablet Commonly known as: GLUCOPHAGE TAKE 1 TABLET BY MOUTH TWICE A DAY WITH A MEAL   tamsulosin 0.4 MG Caps capsule Commonly known as: FLOMAX TAKE 1 CAPSULE BY MOUTH EVERY DAY   Vitamin D3 25 MCG (1000 UT) Caps Take 1,000 Units by mouth daily.       If you experience worsening of your admission symptoms, develop shortness of breath, life threatening emergency, suicidal or homicidal thoughts you must seek medical attention immediately by calling 911 or calling your MD immediately  if symptoms less severe.  You Must read complete instructions/literature along with all the possible adverse reactions/side effects for all the Medicines you take and that have been prescribed to you. Take any new Medicines after you have  completely understood and accept all the possible adverse reactions/side effects.   Please note  You were cared for by a hospitalist during your hospital stay. If you have any questions about your discharge medications or the care you received while you were in the hospital after you are discharged, you can call the unit and asked to speak with the hospitalist on call if the hospitalist that took care of you is not available. Once you are discharged, your primary care physician will handle any further medical issues. Please note that NO REFILLS for any discharge medications will be authorized once you are discharged, as it is imperative that you return to your primary care physician (or establish a relationship with a primary care physician if you do not have one) for your aftercare needs so that they can reassess your need for medications and monitor your lab values. Today   SUBJECTIVE   No new complaints. Feels overall back to baseline. Ambulated with me around the nurses station.  VITAL SIGNS:  Blood pressure 136/78, pulse 73, temperature (!) 97.3 F (36.3 C), temperature source Oral, resp. rate 19, height 6\' 2"  (1.88 m), weight 112.9 kg, SpO2 100 %.  I/O:  Intake/Output Summary (Last 24 hours) at 10/14/2019 1528 Last data filed at 10/14/2019 0920 Gross per 24 hour  Intake 375.83 ml  Output --  Net 375.83 ml    PHYSICAL EXAMINATION:  GENERAL:  69 y.o.-year-old patient lying in the bed with no acute distress.  EYES: Pupils equal, round, reactive to light and accommodation. No scleral icterus.  HEENT: Head atraumatic, normocephalic. Oropharynx and nasopharynx clear.  NECK:  Supple, no jugular venous distention. No thyroid enlargement, no tenderness.  LUNGS: Normal breath sounds bilaterally, no wheezing, rales,rhonchi or crepitation. No use of accessory muscles of respiration.  CARDIOVASCULAR: S1, S2 normal. No murmurs, rubs, or gallops.  ABDOMEN: Soft, non-tender, non-distended.  Bowel sounds present. No organomegaly or mass.  EXTREMITIES: No pedal edema, cyanosis, or clubbing.  NEUROLOGIC: Cranial nerves II through XII are intact. Muscle strength 5/5 in all extremities. Sensation intact. Gait not checked.  PSYCHIATRIC: The patient is alert and oriented x 3.  SKIN: No obvious rash, lesion, or ulcer.   DATA REVIEW:   CBC  Recent Labs  Lab 10/14/19 0428  WBC 6.2  HGB 13.4  HCT 41.7  PLT 153    Chemistries  Recent Labs  Lab 10/13/19 2100 10/13/19 2100 10/14/19 0428  NA 142   < > 140  K 4.3   < > 3.8  CL 108   < > 107  CO2 23   < > 24  GLUCOSE 172*   < > 141*  BUN 18   < > 18  CREATININE 1.81*   < > 1.64*  CALCIUM 9.3   < > 8.5*  MG 1.7   < > 2.0  AST 18  --   --   ALT 15  --   --   ALKPHOS 99  --   --   BILITOT 0.9  --   --    < > = values in this interval not displayed.    Microbiology Results   Recent Results (from the past 240 hour(s))  SARS CORONAVIRUS 2 (TAT 6-24 HRS) Nasopharyngeal Nasopharyngeal Swab     Status: None   Collection Time: 10/13/19 11:50 PM   Specimen: Nasopharyngeal Swab  Result Value Ref Range Status   SARS Coronavirus 2 NEGATIVE NEGATIVE Final    Comment: (NOTE) SARS-CoV-2 target nucleic acids are NOT DETECTED. The SARS-CoV-2 RNA is generally detectable in upper and lower respiratory specimens during the acute phase of infection. Negative results do not preclude SARS-CoV-2 infection, do not rule out co-infections with other pathogens, and should not be used as the sole basis for treatment or other patient management decisions. Negative results must be combined with clinical observations, patient history, and epidemiological information. The expected result is Negative. Fact Sheet for Patients: SugarRoll.be Fact Sheet for Healthcare Providers: https://www.woods-mathews.com/ This test is not yet approved or cleared by the [place] FDA and  has been authorized for  detection and/or diagnosis of SARS-CoV-2 by FDA under an Emergency Use Authorization (EUA). This EUA will remain  in effect (meaning this test can be used) for the duration of the COVID-19 declaration under Section 56 4(b)(1) of the Act, 21 U.S.C. section 360bbb-3(b)(1), unless the authorization is terminated or revoked sooner. Performed at [facility], [place] [address] [place], [place] [address]     RADIOLOGY:  CT HEAD WO CONTRAST  Result Date: 10/14/2019 CLINICAL DATA:  Seizure EXAM: CT HEAD WITHOUT CONTRAST TECHNIQUE: Contiguous axial images were obtained from the base of the skull through the vertex without  intravenous contrast. COMPARISON:  None. FINDINGS: Brain: There is no acute intracranial hemorrhage, mass-effect, or edema. There is no acute appearing loss of gray differentiation. Right temporal encephalomalacia is present with ex vacuo dilatation of the right temporal horn. Additional patchy hypoattenuation in the supratentorial white matter is nonspecific but may reflect mild chronic microvascular ischemic changes. There is no extra-axial fluid collection. Vascular: No hyperdense vessel or unexpected calcification. Skull: Prior right craniotomy.  Otherwise unremarkable. Sinuses/Orbits: Mild mucosal thickening.  Orbits are unremarkable. Other: None. IMPRESSION: No acute intracranial hemorrhage, mass effect, or evidence of acute infarction. Right temporal encephalomalacia. Mild chronic microvascular ischemic changes. Electronically Signed   By: Macy Mis M.D.   On: 10/14/2019 10:04   DG Chest Port 1 View  Result Date: 10/13/2019 CLINICAL DATA:  Generalized weakness, fell EXAM: PORTABLE CHEST 1 VIEW COMPARISON:  01/20/2012 FINDINGS: The heart size and mediastinal contours are within normal limits. Both lungs are clear. The visualized skeletal structures are unremarkable. IMPRESSION: No active disease. Electronically Signed   By: Randa Ngo M.D.   On: 10/13/2019 21:52    ECHOCARDIOGRAM COMPLETE  Result Date: 10/14/2019    ECHOCARDIOGRAM REPORT   Patient Name:   Kirk Bennett. Date of Exam: 10/14/2019 Medical Rec #:  [id number]          Height:       74.0 in Accession #:    [id number]         Weight:       248.9 lb Date of Birth:  02-19-1951          BSA:          2.385 m Patient Age:    41 years           BP:           136/78 mmHg Patient Gender: M                  HR:           73 bpm. Exam Location:  [facility] Procedure: 2D Echo, Cardiac Doppler and Color Doppler Indications:     Syncope 780.2  History:         Patient has no prior history of Echocardiogram examinations.                  Risk Factors:Diabetes and Hypertension. CKD, SVT.  Sonographer:     Sherrie Sport RDCS (AE) Referring Phys:  [id number] Kirk Bennett Diagnosing Phys: Kathlyn Sacramento MD IMPRESSIONS  1. Left ventricular ejection fraction, by estimation, is 55 to 60%. The left ventricle has normal function. The left ventricle has no regional wall motion abnormalities. There is mild left ventricular hypertrophy. Left ventricular diastolic parameters are consistent with Grade I diastolic dysfunction (impaired relaxation).  2. Right ventricular systolic function is normal. The right ventricular size is normal. Tricuspid regurgitation signal is inadequate for assessing PA pressure.  3. Left atrial size was mildly dilated.  4. The mitral valve is normal in structure and function. No evidence of mitral valve regurgitation. No evidence of mitral stenosis.  5. The aortic valve is normal in structure and function. Aortic valve regurgitation is not visualized. Mild aortic valve sclerosis is present, with no evidence of aortic valve stenosis.  6. The inferior vena cava is normal in size with greater than 50% respiratory variability, suggesting right atrial pressure of 3 mmHg. FINDINGS  Left Ventricle: Left ventricular ejection fraction, by estimation, is 55 to 60%. The left ventricle  has normal function. The left ventricle has  no regional wall motion abnormalities. The left ventricular internal cavity size was normal in size. There is  mild left ventricular hypertrophy. Left ventricular diastolic parameters are consistent with Grade I diastolic dysfunction (impaired relaxation). Right Ventricle: The right ventricular size is normal. No increase in right ventricular wall thickness. Right ventricular systolic function is normal. Tricuspid regurgitation signal is inadequate for assessing PA pressure. The tricuspid regurgitant velocity is 1.67 m/s, and with an assumed right atrial pressure of 10 mmHg, the estimated right ventricular systolic pressure is 0000000 mmHg. Left Atrium: Left atrial size was mildly dilated. Right Atrium: Right atrial size was normal in size. Pericardium: There is no evidence of pericardial effusion. Mitral Valve: The mitral valve is normal in structure and function. Normal mobility of the mitral valve leaflets. No evidence of mitral valve regurgitation. No evidence of mitral valve stenosis. Tricuspid Valve: The tricuspid valve is normal in structure. Tricuspid valve regurgitation is not demonstrated. No evidence of tricuspid stenosis. Aortic Valve: The aortic valve is normal in structure and function. Aortic valve regurgitation is not visualized. Mild aortic valve sclerosis is present, with no evidence of aortic valve stenosis. Aortic valve mean gradient measures 2.0 mmHg. Aortic valve peak gradient measures 4.2 mmHg. Aortic valve area, by VTI measures 2.76 cm. Pulmonic Valve: The pulmonic valve was normal in structure. Pulmonic valve regurgitation is trivial. No evidence of pulmonic stenosis. Aorta: The aortic root is normal in size and structure. Venous: The inferior vena cava is normal in size with greater than 50% respiratory variability, suggesting right atrial pressure of 3 mmHg. IAS/Shunts: No atrial level shunt detected by color flow Doppler.  LEFT VENTRICLE PLAX 2D LVIDd:         4.22 cm  Diastology LVIDs:          2.93 cm  LV e' lateral:   4.35 cm/s LV PW:         0.89 cm  LV E/e' lateral: 10.4 LV IVS:        1.31 cm  LV e' medial:    5.87 cm/s LVOT diam:     2.00 cm  LV E/e' medial:  7.7 LV SV:         43.67 ml LV SV Index:   18.31 LVOT Area:     3.14 cm  RIGHT VENTRICLE RV Basal diam:  4.18 cm RV S prime:     12.60 cm/s TAPSE (M-mode): 4.0 cm LEFT ATRIUM             Index       RIGHT ATRIUM           Index LA diam:        3.30 cm 1.38 cm/m  RA Area:     19.90 cm LA Vol (A2C):   48.0 ml 20.12 ml/m RA Volume:   55.30 ml  23.18 ml/m LA Vol (A4C):   63.5 ml 26.62 ml/m LA Biplane Vol: 58.5 ml 24.53 ml/m  AORTIC VALVE                   PULMONIC VALVE AV Area (Vmax):    1.97 cm    PV Vmax:        0.92 m/s AV Area (Vmean):   2.01 cm    PV Peak grad:   3.4 mmHg AV Area (VTI):     2.76 cm    RVOT Peak grad: 4 mmHg AV Vmax:  102.50 cm/s AV Vmean:          69.700 cm/s AV VTI:            0.158 m AV Peak Grad:      4.2 mmHg AV Mean Grad:      2.0 mmHg LVOT Vmax:         64.30 cm/s LVOT Vmean:        44.700 cm/s LVOT VTI:          0.139 m LVOT/AV VTI ratio: 0.88  AORTA Ao Root diam: 3.10 cm MITRAL VALVE               TRICUSPID VALVE MV Area (PHT): 3.65 cm    TR Peak grad:   11.2 mmHg MV Decel Time: 208 msec    TR Vmax:        167.00 cm/s MV E velocity: 45.10 cm/s MV A velocity: 91.20 cm/s  SHUNTS MV E/A ratio:  0.49        Systemic VTI:  0.14 m                            Systemic Diam: 2.00 cm Kathlyn Sacramento MD Electronically signed by Kathlyn Sacramento MD Signature Date/Time: 10/14/2019/11:36:43 AM    Final      CODE STATUS:     Code Status Orders  (From admission, onward)         Start     Ordered   10/13/19 2341  Full code  Continuous     10/13/19 2345        Code Status History    This patient has a current code status but no historical code status.   Advance Care Planning Activity    Advance Directive Documentation     Most Recent Value  Type of Advance Directive  Living will  Pre-existing  out of facility DNR order (yellow form or pink MOST form)  --  "MOST" Form in Place?  --       TOTAL TIME TAKING CARE OF THIS PATIENT: *35* minutes.    Fritzi Mandes M.D  [facility]    CC: Primary care physician; Kirk Bush, MD

## 2019-10-14 NOTE — Consult Note (Signed)
[facility] Consultation Note  Patient ID: Kirk Orn., MRN: [id number], DOB/AGE: 1951-04-11 69 y.o. Admit date: 10/13/2019   Date of Consult: 10/14/2019 Primary Physician: Ria Bush, MD Primary Cardiologist: None  Chief Complaint:  Chief Complaint  Patient presents with  . Weakness   Reason for Consult: Preventricular contractions  HPI: 69 y.o. male with known diabetes hypertension hyperlipidemia previous stroke chronic kidney disease and previous palpitations with preventricular contractions as well as seizure disorder.  The patient has been doing fairly well with appropriate medication management for risk reduction in cardiovascular event.  He has had no evidence of current chest discomfort anginal symptoms and/or congestive heart failure.  Occasionally has palpitations which are mainly PVCs for which he was seen by EKG.  He has had an episode where he stood up out of a chair fell backward into the chair and slide down to the floor and was unable to get up.  Since then he has been doing fine throughout the last 12 hours with telemetry showing normal sinus rhythm with preventricular contractions alone.  There was no other significant rhythm disturbances or concerns for heart failure or angina.  He has been ambulating slightly with no evidence of hypotension or syncope.  Past Medical History:  Diagnosis Date  . Cataracts, bilateral 02/2011   and suspected glaucoma, to return for f/u, no diabetic retinopathy  . CKD stage 3 due to type 2 diabetes mellitus ([place]) 2015   normal renal US, self referred to Dr Holley Raring  . 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 Mora Bellman at [facility])  . Depression    found by neuro  . Elevated PSA 05/27/2015   Serial monitoring ([place])   . Glaucoma 2015   suspect  . History of hepatitis A   . HLD (hyperlipidemia)   . HTN  (hypertension)   . Knee pain    s/p replacement  . SVT (supraventricular tachycardia) ([place]) 1996  . Vitamin D deficiency 03/02/2015  . 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 Gabriel Carina endo --> 02/2016 decided to return to PCP for DM care      Surgical History:  Past Surgical History:  Procedure Laterality Date  . CARDIAC CATHETERIZATION  July 2010   No blockages (Dr. Liliane Shi)  . COLONOSCOPY  09/16/2005   hyperplastic polyps, rpt due 10 yrs   . COLONOSCOPY WITH PROPOFOL N/A 12/11/2015   mult polyps, few TA, rpt 74yrs ([place])  . corrective surgery amblyopia  1957  . Cystectomy or meningioma removal brain  1990   (unclear)  . Left knee surgery  1971   Torn ACL  . REPLACEMENT TOTAL KNEE  April 2011   Left [facility] Dr. Garald Balding)     Home Meds: Prior to Admission medications   Medication Sig Start Date End Date Taking? Authorizing Provider  atorvastatin (LIPITOR) 20 MG tablet TAKE 1 TABLET BY MOUTH EVERY DAY 10/07/19  Yes Ria Bush, MD  Cholecalciferol (VITAMIN D3) 1000 units CAPS Take 1,000 Units by mouth daily.   Yes [provider]  dapagliflozin propanediol (FARXIGA) 10 MG TABS tablet TAKE 1 TABLET (10 MG TOTAL) BY MOUTH EVERY MORNING. 09/21/18  Yes Ria Bush, MD  glimepiride (AMARYL) 2 MG tablet TAKE 1 TABLET BY MOUTH DAILY WITH BREAKFAST 10/07/19  Yes Ria Bush, MD  levETIRAcetam (KEPPRA) 500 MG tablet Take 500 mg by  mouth as directed. 2 tab AM, 3 tab PM   Yes [provider]  lisinopril (ZESTRIL) 10 MG tablet TAKE 1 TABLET BY MOUTH TWICE A DAY 10/07/19  Yes Ria Bush, MD  metFORMIN (GLUCOPHAGE) 1000 MG tablet TAKE 1 TABLET BY MOUTH TWICE A DAY WITH A MEAL 09/21/18  Yes Ria Bush, MD  tamsulosin (FLOMAX) 0.4 MG CAPS capsule TAKE 1 CAPSULE BY MOUTH EVERY DAY 10/07/19  Yes Ria Bush, MD  glucose blood (ONE TOUCH ULTRA TEST) test strip Use to check sugar fasting in the AM  and 2 hours after lunch or supper. Dx: E11.21 03/28/16   Ria Bush, MD    Inpatient Medications:  . atorvastatin  20 mg Oral q1800  . enoxaparin (LOVENOX) injection  40 mg Subcutaneous Q24H  . insulin aspart  0-15 Units Subcutaneous TID WC  . insulin aspart  0-5 Units Subcutaneous QHS  . levETIRAcetam  1,000 mg Oral Daily  . levETIRAcetam  1,500 mg Oral QHS  . lisinopril  10 mg Oral BID  . sodium chloride flush  3 mL Intravenous Q12H  . tamsulosin  0.4 mg Oral Daily     Allergies: No Known Allergies  Social History   Socioeconomic History  . Marital status: Married    Spouse name: Not on file  . Number of children: Not on file  . Years of education: Not on file  . Highest education level: Not on file  Occupational History  . Not on file  Tobacco Use  . Smoking status: Never Smoker  . Smokeless tobacco: Never Used  Substance and Sexual Activity  . Alcohol use: No    Alcohol/week: 0.0 standard drinks  . Drug use: No  . Sexual activity: Not on file  Other Topics Concern  . 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   Social Determinants of Health   Financial Resource Strain:   . Difficulty of Paying Living Expenses: Not on file  Food Insecurity:   . Worried About Charity fundraiser in the Last Year: Not on file  . Ran Out of Food in the Last Year: Not on file  Transportation Needs:   . Lack of Transportation (Medical): Not on file  . Lack of Transportation (Non-Medical): Not on file  Physical Activity:   . Days of Exercise per Week: Not on file  . Minutes of Exercise per Session: Not on file  Stress:   . Feeling of Stress : Not on file  Social Connections:   . Frequency of Communication with Friends and Family: Not on file  . Frequency of Social  Gatherings with Friends and Family: Not on file  . Attends Religious Services: Not on file  . Active Member of Clubs or Organizations: Not on file  . Attends Archivist Meetings: Not on file  . Marital Status: Not on file  Intimate Partner Violence:   . Fear of Current or Ex-Partner: Not on file  . Emotionally Abused: Not on file  . Physically Abused: Not on file  . Sexually Abused: Not on file     Family History  Problem Relation Age of Onset  . Heart failure Father   . Cancer Mother        Lung, smoker  . Aortic dissection Mother  Aortic rupture  . Mental illness Sister        2 years old  . Diabetes Maternal Aunt   . Diabetes Cousin   . Cancer Cousin        Colon age 74  . CAD Neg Hx      Review of Systems Positive for dizziness Negative for: General:  chills, fever, night sweats or weight changes.  Cardiovascular: PND orthopnea syncope positive for dizziness  Dermatological skin lesions rashes Respiratory: Cough congestion Urologic: Frequent urination urination at night and hematuria Abdominal: negative for nausea, vomiting, diarrhea, bright red blood per rectum, melena, or hematemesis Neurologic: negative for visual changes, and/or hearing changes  All other systems reviewed and are otherwise negative except as noted above.  Labs: Recent Labs    10/13/19 2100  CKTOTAL 84   Lab Results  Component Value Date   WBC 6.2 10/14/2019   HGB 13.4 10/14/2019   HCT 41.7 10/14/2019   MCV 85.6 10/14/2019   PLT 153 10/14/2019    Recent Labs  Lab 10/13/19 2100 10/13/19 2100 10/14/19 0428  NA 142   < > 140  K 4.3   < > 3.8  CL 108   < > 107  CO2 23   < > 24  BUN 18   < > 18  CREATININE 1.81*   < > 1.64*  CALCIUM 9.3   < > 8.5*  PROT 7.3  --   --   BILITOT 0.9  --   --   ALKPHOS 99  --   --   ALT 15  --   --   AST 18  --   --   GLUCOSE 172*   < > 141*   < > = values in this interval not displayed.   Lab Results  Component Value Date    CHOL 131 09/20/2019   HDL 32 (L) 09/20/2019   LDLCALC 82 09/20/2019   TRIG 90 09/20/2019   No results found for: DDIMER  Radiology/Studies:  CT HEAD WO CONTRAST  Result Date: 10/14/2019 CLINICAL DATA:  Seizure EXAM: CT HEAD WITHOUT CONTRAST TECHNIQUE: Contiguous axial images were obtained from the base of the skull through the vertex without intravenous contrast. COMPARISON:  None. FINDINGS: Brain: There is no acute intracranial hemorrhage, mass-effect, or edema. There is no acute appearing loss of gray differentiation. Right temporal encephalomalacia is present with ex vacuo dilatation of the right temporal horn. Additional patchy hypoattenuation in the supratentorial white matter is nonspecific but may reflect mild chronic microvascular ischemic changes. There is no extra-axial fluid collection. Vascular: No hyperdense vessel or unexpected calcification. Skull: Prior right craniotomy.  Otherwise unremarkable. Sinuses/Orbits: Mild mucosal thickening.  Orbits are unremarkable. Other: None. IMPRESSION: No acute intracranial hemorrhage, mass effect, or evidence of acute infarction. Right temporal encephalomalacia. Mild chronic microvascular ischemic changes. Electronically Signed   By: Macy Mis M.D.   On: 10/14/2019 10:04   DG Chest Port 1 View  Result Date: 10/13/2019 CLINICAL DATA:  Generalized weakness, fell EXAM: PORTABLE CHEST 1 VIEW COMPARISON:  01/20/2012 FINDINGS: The heart size and mediastinal contours are within normal limits. Both lungs are clear. The visualized skeletal structures are unremarkable. IMPRESSION: No active disease. Electronically Signed   By: Randa Ngo M.D.   On: 10/13/2019 21:52   ECHOCARDIOGRAM COMPLETE  Result Date: 10/14/2019    ECHOCARDIOGRAM REPORT   Patient Name:   Jerauld Domagalski. Date of Exam: 10/14/2019 Medical Rec #:  [id number]  Height:       74.0 in Accession #:    [id number]         Weight:       248.9 lb Date of Birth:  14-Apr-1951           BSA:          2.385 m Patient Age:    89 years           BP:           136/78 mmHg Patient Gender: M                  HR:           73 bpm. Exam Location:  [facility] Procedure: 2D Echo, Cardiac Doppler and Color Doppler Indications:     Syncope 780.2  History:         Patient has no prior history of Echocardiogram examinations.                  Risk Factors:Diabetes and Hypertension. CKD, SVT.  Sonographer:     Sherrie Sport RDCS (AE) Referring Phys:  [id number] Athena Masse Diagnosing Phys: Kathlyn Sacramento MD IMPRESSIONS  1. Left ventricular ejection fraction, by estimation, is 55 to 60%. The left ventricle has normal function. The left ventricle has no regional wall motion abnormalities. There is mild left ventricular hypertrophy. Left ventricular diastolic parameters are consistent with Grade I diastolic dysfunction (impaired relaxation).  2. Right ventricular systolic function is normal. The right ventricular size is normal. Tricuspid regurgitation signal is inadequate for assessing PA pressure.  3. Left atrial size was mildly dilated.  4. The mitral valve is normal in structure and function. No evidence of mitral valve regurgitation. No evidence of mitral stenosis.  5. The aortic valve is normal in structure and function. Aortic valve regurgitation is not visualized. Mild aortic valve sclerosis is present, with no evidence of aortic valve stenosis.  6. The inferior vena cava is normal in size with greater than 50% respiratory variability, suggesting right atrial pressure of 3 mmHg. FINDINGS  Left Ventricle: Left ventricular ejection fraction, by estimation, is 55 to 60%. The left ventricle has normal function. The left ventricle has no regional wall motion abnormalities. The left ventricular internal cavity size was normal in size. There is  mild left ventricular hypertrophy. Left ventricular diastolic parameters are consistent with Grade I diastolic dysfunction (impaired relaxation). Right Ventricle: The right  ventricular size is normal. No increase in right ventricular wall thickness. Right ventricular systolic function is normal. Tricuspid regurgitation signal is inadequate for assessing PA pressure. The tricuspid regurgitant velocity is 1.67 m/s, and with an assumed right atrial pressure of 10 mmHg, the estimated right ventricular systolic pressure is 0000000 mmHg. Left Atrium: Left atrial size was mildly dilated. Right Atrium: Right atrial size was normal in size. Pericardium: There is no evidence of pericardial effusion. Mitral Valve: The mitral valve is normal in structure and function. Normal mobility of the mitral valve leaflets. No evidence of mitral valve regurgitation. No evidence of mitral valve stenosis. Tricuspid Valve: The tricuspid valve is normal in structure. Tricuspid valve regurgitation is not demonstrated. No evidence of tricuspid stenosis. Aortic Valve: The aortic valve is normal in structure and function. Aortic valve regurgitation is not visualized. Mild aortic valve sclerosis is present, with no evidence of aortic valve stenosis. Aortic valve mean gradient measures 2.0 mmHg. Aortic valve peak gradient measures 4.2 mmHg. Aortic valve area, by VTI measures 2.76 cm.  Pulmonic Valve: The pulmonic valve was normal in structure. Pulmonic valve regurgitation is trivial. No evidence of pulmonic stenosis. Aorta: The aortic root is normal in size and structure. Venous: The inferior vena cava is normal in size with greater than 50% respiratory variability, suggesting right atrial pressure of 3 mmHg. IAS/Shunts: No atrial level shunt detected by color flow Doppler.  LEFT VENTRICLE PLAX 2D LVIDd:         4.22 cm  Diastology LVIDs:         2.93 cm  LV e' lateral:   4.35 cm/s LV PW:         0.89 cm  LV E/e' lateral: 10.4 LV IVS:        1.31 cm  LV e' medial:    5.87 cm/s LVOT diam:     2.00 cm  LV E/e' medial:  7.7 LV SV:         43.67 ml LV SV Index:   18.31 LVOT Area:     3.14 cm  RIGHT VENTRICLE RV Basal diam:   4.18 cm RV S prime:     12.60 cm/s TAPSE (M-mode): 4.0 cm LEFT ATRIUM             Index       RIGHT ATRIUM           Index LA diam:        3.30 cm 1.38 cm/m  RA Area:     19.90 cm LA Vol (A2C):   48.0 ml 20.12 ml/m RA Volume:   55.30 ml  23.18 ml/m LA Vol (A4C):   63.5 ml 26.62 ml/m LA Biplane Vol: 58.5 ml 24.53 ml/m  AORTIC VALVE                   PULMONIC VALVE AV Area (Vmax):    1.97 cm    PV Vmax:        0.92 m/s AV Area (Vmean):   2.01 cm    PV Peak grad:   3.4 mmHg AV Area (VTI):     2.76 cm    RVOT Peak grad: 4 mmHg AV Vmax:           102.50 cm/s AV Vmean:          69.700 cm/s AV VTI:            0.158 m AV Peak Grad:      4.2 mmHg AV Mean Grad:      2.0 mmHg LVOT Vmax:         64.30 cm/s LVOT Vmean:        44.700 cm/s LVOT VTI:          0.139 m LVOT/AV VTI ratio: 0.88  AORTA Ao Root diam: 3.10 cm MITRAL VALVE               TRICUSPID VALVE MV Area (PHT): 3.65 cm    TR Peak grad:   11.2 mmHg MV Decel Time: 208 msec    TR Vmax:        167.00 cm/s MV E velocity: 45.10 cm/s MV A velocity: 91.20 cm/s  SHUNTS MV E/A ratio:  0.49        Systemic VTI:  0.14 m                            Systemic Diam: 2.00 cm Kathlyn Sacramento MD Electronically signed by Kathlyn Sacramento MD Signature Date/Time: 10/14/2019/11:36:43  AM    Final     EKG: Normal sinus rhythm with left atrial enlargement and preventricular contractions  Weights: Filed Weights   10/13/19 2106 10/14/19 0105  Weight: 110.7 kg 112.9 kg     Physical Exam: Blood pressure 136/78, pulse 73, temperature (!) 97.3 F (36.3 C), temperature source Oral, resp. rate 19, height 6\' 2"  (1.88 m), weight 112.9 kg, SpO2 100 %. Body mass index is 31.96 kg/m. General: Well developed, well nourished, in no acute distress. Head eyes ears nose throat: Normocephalic, atraumatic, sclera non-icteric, no xanthomas, nares are without discharge. No apparent thyromegaly and/or mass  Lungs: Normal respiratory effort.  no wheezes, no rales, no rhonchi.  Heart: RRR with  normal S1 S2. no murmur gallop, no rub, PMI is normal size and placement, carotid upstroke normal without bruit, jugular venous pressure is normal Abdomen: Soft, non-tender, non-distended with normoactive bowel sounds. No hepatomegaly. No rebound/guarding. No obvious abdominal masses. Abdominal aorta is normal size without bruit Extremities: No edema. no cyanosis, no clubbing, no ulcers  Peripheral : 2+ bilateral upper extremity pulses, 2+ bilateral femoral pulses, 2+ bilateral dorsal pedal pulse Neuro: Alert and oriented. No facial asymmetry. No focal deficit. Moves all extremities spontaneously. Musculoskeletal: Normal muscle tone without kyphosis Psych:  Responds to questions appropriately with a normal affect.    Assessment: 69 year old male with diabetes hypertension hyperlipidemia peripheral vascular disease chronic kidney disease with chronic benign preventricular contractions having a dizzy and fall spell without evidence of congestive heart failure rhythm disturbances causing that fall and/or myocardial infarction.  No further evidence of hypotension or orthostasis  Plan: 1.  Continue medication management for further risk reduction of cardiovascular disease in the future including high intensity cholesterol therapy and hypertension control 2.  Begin ambulation and follow for improvements of symptoms and concerns for orthostatic hypotension and causes of dizziness including rhythm disturbances 3.  Consider echocardiogram for LV systolic dysfunction valvular heart disease contributing to above 4.  If ambulating well without evidence of significant symptoms or concerns okay for discharge home with follow-up in 1 to 2 weeks for further adjustments of medications or investigations needed  Signed, Corey Skains M.D. [facility] 10/14/2019, 12:09 PM

## 2019-10-14 NOTE — Discharge Instructions (Signed)
Patient advised not to drive till seen by out pt neurology and cleared for driving

## 2019-10-15 LAB — HIV ANTIBODY (ROUTINE TESTING W REFLEX): HIV Screen 4th Generation wRfx: NONREACTIVE — AB

## 2019-10-16 LAB — LEVETIRACETAM LEVEL: Levetiracetam Lvl: 34.4 ug/mL (ref 10.0–40.0)

## 2019-10-21 ENCOUNTER — Ambulatory Visit: Payer: 59 | Admitting: Family Medicine

## 2019-10-21 ENCOUNTER — Encounter: Payer: Self-pay | Admitting: Family Medicine

## 2019-10-21 ENCOUNTER — Other Ambulatory Visit: Payer: Self-pay

## 2019-10-21 VITALS — BP 118/64 | HR 93 | Temp 97.5°F | Ht 73.5 in | Wt 248.4 lb

## 2019-10-21 DIAGNOSIS — E1140 Type 2 diabetes mellitus with diabetic neuropathy, unspecified: Secondary | ICD-10-CM | POA: Insufficient documentation

## 2019-10-21 DIAGNOSIS — N3941 Urge incontinence: Secondary | ICD-10-CM

## 2019-10-21 DIAGNOSIS — E1121 Type 2 diabetes mellitus with diabetic nephropathy: Secondary | ICD-10-CM

## 2019-10-21 DIAGNOSIS — R531 Weakness: Secondary | ICD-10-CM

## 2019-10-21 DIAGNOSIS — G40109 Localization-related (focal) (partial) symptomatic epilepsy and epileptic syndromes with simple partial seizures, not intractable, without status epilepticus: Secondary | ICD-10-CM

## 2019-10-21 MED ORDER — FARXIGA 5 MG PO TABS: 5.0000 mg | ORAL_TABLET | Freq: Every day | ORAL | 1 refills | Status: DC

## 2019-10-21 NOTE — Assessment & Plan Note (Signed)
Recent hospitalization for gen weakness with fall in setting of recent 2nd covid vaccine. Overall reassuring workup while in hospital, today with some generalized weakness but otherwise non focal neurological exam. Encouraged he continue HEP after recent PT course (07/2019). Encouraged f/u with neurology.

## 2019-10-21 NOTE — Assessment & Plan Note (Addendum)
Stable period. Recent keppra level in therapeutic range. Follows with neurology.

## 2019-10-21 NOTE — Progress Notes (Signed)
This visit was conducted in person.  BP 118/64 (BP Location: Left Arm, Patient Position: Sitting, Cuff Size: Large)   Pulse 93   Temp (!) 97.5 F (36.4 C) (Temporal)   Ht 6' 1.5" (1.867 m)   Wt 248 lb 7 oz (112.7 kg)   SpO2 95%   BMI 32.33 kg/m   Orthostatic VS for the past 24 hrs (Last 3 readings):  BP- Lying BP- Standing at 0 minutes  10/21/19 0853 -- 120/60  10/21/19 0850 122/50 --    CC: hosp f/u visit Subjective:    Patient ID: Kirk Bennett., male    DOB: 1951-01-29, 69 y.o.   MRN: [id number]  HPI: Kirk Bennett. is a 69 y.o. male presenting on 10/21/2019 for Hospitalization Follow-up   Recent hospitalization for episode of weakness while standing from a chair leading to falling backwards into chair and sliding on the floor associated with dizziness. Noted frequent PVCs, s/p reassuring cardiology evaluation including consult (Dr Aron Baba). Stable echocardiogram with EF 55-60%. Planned outpatient f/u. No reported seizures, felt stable on keppra and planned f/u Dr Opal Sidles outpatient [facility]). Head CT showing R temporal encephalomalacia. No indication for EEG. Declined PT eval.   Fall occurred 2 days after 2nd Covid shot. He also had sore arm and some diaphoresis after shot. No fevers or body aches. Denies dizziness or lightheadedness or presyncope prior to fall. Denies vision changes, unilateral numbness/weakness. Endorses some neuropathic sensation of toes, not feet or hands. No burning pain or paresthesias.   He completed outpatient PT course last year with benefit.   No upcoming cards appt. Will call for neurology f/u appt Opal Sidles).    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 Interim medical history since our last visit reviewed. Allergies and medications reviewed and updated. Outpatient Medications Prior to Visit  Medication Sig Dispense Refill  . atorvastatin (LIPITOR) 20 MG tablet TAKE 1 TABLET BY MOUTH EVERY DAY 90 tablet 3  . Cholecalciferol (VITAMIN D3) 1000 units CAPS Take 1,000 Units by mouth daily.    Marland Kitchen glimepiride (AMARYL) 2 MG tablet TAKE 1 TABLET BY MOUTH DAILY WITH BREAKFAST 90 tablet 3  . glucose blood (ONE TOUCH ULTRA TEST) test strip Use to check sugar fasting in the AM and 2 hours after lunch or supper. Dx: E11.21 100 each 3  . levETIRAcetam (KEPPRA) 500 MG tablet Take 500 mg by mouth as directed. 2 tab AM, 3 tab PM    . lisinopril (ZESTRIL) 10 MG tablet TAKE 1 TABLET BY MOUTH TWICE A DAY 180 tablet 3  . metFORMIN (GLUCOPHAGE) 1000 MG tablet TAKE 1 TABLET BY MOUTH TWICE A DAY WITH A MEAL 180 tablet 3  . tamsulosin (FLOMAX) 0.4 MG CAPS capsule TAKE 1 CAPSULE BY MOUTH EVERY DAY 90 capsule 3  . dapagliflozin propanediol (FARXIGA) 10 MG TABS tablet TAKE 1 TABLET (10 MG TOTAL) BY MOUTH EVERY MORNING. 90 tablet 3   No facility-administered medications prior to visit.     Per HPI unless specifically indicated in ROS section below Review of Systems Objective:    BP 118/64 (BP Location: Left Arm, Patient Position: Sitting, Cuff Size:  Large)   Pulse 93   Temp (!) 97.5 F (36.4 C) (Temporal)   Ht 6' 1.5" (1.867 m)   Wt 248 lb 7 oz (112.7 kg)   SpO2 95%   BMI 32.33 kg/m   Wt Readings from Last 3 Encounters:  10/21/19 248 lb 7 oz (112.7 kg)  10/14/19 248 lb 14.4 oz (112.9 kg)  09/26/19 245 lb 8 oz (111.4 kg)    Physical Exam Vitals and nursing note reviewed.  Constitutional:      Appearance: Normal appearance. He is not ill-appearing.  HENT:     Mouth/Throat:     Mouth: Mucous  membranes are moist.     Pharynx: Oropharynx is clear. No oropharyngeal exudate or posterior oropharyngeal erythema.  Eyes:     Extraocular Movements: Extraocular movements intact.     Pupils: Pupils are equal, round, and reactive to light.  Neck:     Thyroid: No thyromegaly or thyroid tenderness.     Vascular: No carotid bruit.  Cardiovascular:     Rate and Rhythm: Normal rate and regular rhythm.     Pulses: Normal pulses.     Heart sounds: Normal heart sounds. No murmur.  Pulmonary:     Effort: Pulmonary effort is normal. No respiratory distress.     Breath sounds: Normal breath sounds. No wheezing, rhonchi or rales.  Musculoskeletal:     Cervical back: Normal range of motion and neck supple.     Right lower leg: No edema.     Left lower leg: No edema.  Skin:    General: Skin is warm and dry.     Findings: No rash.  Neurological:     General: No focal deficit present.     Mental Status: He is alert.     Cranial Nerves: Cranial nerves are intact.     Sensory: Sensation is intact.     Motor: Motor function is intact. No weakness.     Coordination: Coordination is intact. Romberg sign negative. Coordination normal. Finger-Nose-Finger Test normal. Rapid alternating movements normal.     Gait: Gait is intact.     Comments:  CN 2-12 intact FTN intact No dysdiadochokinesia  5/5 strength BUE Difficulty getting out of chair   Psychiatric:        Mood and Affect: Mood normal.        Behavior: Behavior normal.       Results for orders placed or performed during the hospital encounter of 10/13/19  SARS CORONAVIRUS 2 (TAT 6-24 HRS) Nasopharyngeal Nasopharyngeal Swab   Specimen: Nasopharyngeal Swab  Result Value Ref Range   SARS Coronavirus 2 NEGATIVE NEGATIVE  Comprehensive metabolic panel  Result Value Ref Range   Sodium 142 135 - 145 mmol/L   Potassium 4.3 3.5 - 5.1 mmol/L   Chloride 108 98 - 111 mmol/L   CO2 23 22 - 32 mmol/L   Glucose, Bld 172 (H) 70 - 99 mg/dL   BUN 18  8 - 23 mg/dL   Creatinine, Ser 1.81 (H) 0.61 - 1.24 mg/dL   Calcium 9.3 8.9 - 10.3 mg/dL   Total Protein 7.3 6.5 - 8.1 g/dL   Albumin 3.7 3.5 - 5.0 g/dL   AST 18 15 - 41 U/L   ALT 15 0 - 44 U/L   Alkaline Phosphatase 99 38 - 126 U/L   Total Bilirubin 0.9 0.3 - 1.2 mg/dL   GFR calc non Af Amer 38 (L) >60 mL/min   GFR calc Af Amer 44 (L) >60  mL/min   Anion gap 11 5 - 15  CBC with Differential  Result Value Ref Range   WBC 8.7 4.0 - 10.5 K/uL   RBC 5.19 4.22 - 5.81 MIL/uL   Hemoglobin 14.5 13.0 - 17.0 g/dL   HCT 44.4 39.0 - 52.0 %   MCV 85.5 80.0 - 100.0 fL   MCH 27.9 26.0 - 34.0 pg   MCHC 32.7 30.0 - 36.0 g/dL   RDW 12.0 11.5 - 15.5 %   Platelets 218 150 - 400 K/uL   nRBC 0.0 0.0 - 0.2 %   Neutrophils Relative % 61 %   Neutro Abs 5.5 1.7 - 7.7 K/uL   Lymphocytes Relative 21 %   Lymphs Abs 1.8 0.7 - 4.0 K/uL   Monocytes Relative 13 %   Monocytes Absolute 1.1 (H) 0.1 - 1.0 K/uL   Eosinophils Relative 3 %   Eosinophils Absolute 0.3 0.0 - 0.5 K/uL   Basophils Relative 1 %   Basophils Absolute 0.1 0.0 - 0.1 K/uL   Immature Granulocytes 1 %   Abs Immature Granulocytes 0.04 0.00 - 0.07 K/uL  Magnesium  Result Value Ref Range   Magnesium 1.7 1.7 - 2.4 mg/dL  CK  Result Value Ref Range   Total CK 84 49 - 397 U/L  Urinalysis, Complete w Microscopic  Result Value Ref Range   Color, Urine YELLOW (A) YELLOW   APPearance CLEAR (A) CLEAR   Specific Gravity, Urine 1.017 1.005 - 1.030   pH 5.0 5.0 - 8.0   Glucose, UA >=500 (A) NEGATIVE mg/dL   Hgb urine dipstick NEGATIVE NEGATIVE   Bilirubin Urine NEGATIVE NEGATIVE   Ketones, ur 5 (A) NEGATIVE mg/dL   Protein, ur NEGATIVE NEGATIVE mg/dL   Nitrite NEGATIVE NEGATIVE   Leukocytes,Ua NEGATIVE NEGATIVE   RBC / HPF 0-5 0 - 5 RBC/hpf   WBC, UA 0-5 0 - 5 WBC/hpf   Bacteria, UA NONE SEEN NONE SEEN   Squamous Epithelial / LPF 0-5 0 - 5  Brain natriuretic peptide  Result Value Ref Range   B Natriuretic Peptide 26.0 0.0 - 100.0 pg/mL    HIV Antibody (routine testing w rflx)  Result Value Ref Range   HIV Screen 4th Generation wRfx Non Reactive (A) Non Reactive  Hemoglobin A1c  Result Value Ref Range   Hgb A1c MFr Bld 6.1 (H) 4.8 - 5.6 %   Mean Plasma Glucose 128.37 mg/dL  TSH  Result Value Ref Range   TSH 0.732 0.350 - 4.500 uIU/mL  Basic metabolic panel  Result Value Ref Range   Sodium 140 135 - 145 mmol/L   Potassium 3.8 3.5 - 5.1 mmol/L   Chloride 107 98 - 111 mmol/L   CO2 24 22 - 32 mmol/L   Glucose, Bld 141 (H) 70 - 99 mg/dL   BUN 18 8 - 23 mg/dL   Creatinine, Ser 1.64 (H) 0.61 - 1.24 mg/dL   Calcium 8.5 (L) 8.9 - 10.3 mg/dL   GFR calc non Af Amer 43 (L) >60 mL/min   GFR calc Af Amer 50 (L) >60 mL/min   Anion gap 9 5 - 15  CBC  Result Value Ref Range   WBC 6.2 4.0 - 10.5 K/uL   RBC 4.87 4.22 - 5.81 MIL/uL   Hemoglobin 13.4 13.0 - 17.0 g/dL   HCT 41.7 39.0 - 52.0 %   MCV 85.6 80.0 - 100.0 fL   MCH 27.5 26.0 - 34.0 pg   MCHC 32.1 30.0 - 36.0  g/dL   RDW 11.8 11.5 - 15.5 %   Platelets 153 150 - 400 K/uL   nRBC 0.0 0.0 - 0.2 %  Magnesium  Result Value Ref Range   Magnesium 2.0 1.7 - 2.4 mg/dL  Levetiracetam level  Result Value Ref Range   Levetiracetam Lvl 34.4 10.0 - 40.0 ug/mL  Glucose, capillary  Result Value Ref Range   Glucose-Capillary 151 (H) 70 - 99 mg/dL  Glucose, capillary  Result Value Ref Range   Glucose-Capillary 125 (H) 70 - 99 mg/dL  ECHOCARDIOGRAM COMPLETE  Result Value Ref Range   Weight 3,982.4 oz   Height 74 in   BP 136/78 mmHg  Troponin I (High Sensitivity)  Result Value Ref Range   Troponin I (High Sensitivity) 3 <18 ng/L  Troponin I (High Sensitivity)  Result Value Ref Range   Troponin I (High Sensitivity) 4 <18 ng/L   Assessment & Plan:  This visit occurred during the SARS-CoV-2 public health emergency.  Safety protocols were in place, including screening questions prior to the visit, additional usage of staff PPE, and extensive cleaning of exam room while observing  appropriate contact time as indicated for disinfecting solutions.   Problem List Items Addressed This Visit    Urge urinary incontinence    Ongoing trouble. Will trial lower farxiga dose. Update with effect. Recent UA normal.       Type 2 diabetes mellitus with diabetic neuropathy, unspecified (HCC)    Endorses some neuropathy at toes. Discussed impaired proprioception and contribution to unsteadiness.       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 Recent keppra level in therapeutic range. Follows with neurology.       Generalized weakness - Primary    Recent hospitalization for gen weakness with fall in setting of recent 2nd covid vaccine. Overall reassuring workup while in hospital, today with some generalized weakness but otherwise non focal neurological exam. Encouraged he continue HEP after recent PT course (07/2019). Encouraged f/u with neurology.       Controlled type 2 diabetes mellitus with diabetic nephropathy (HCC)    Overall well controlled, latest A1c 6.1% during hospitalization.       Relevant Medications   dapagliflozin propanediol (FARXIGA) 5 MG TABS tablet       Meds ordered this encounter  Medications  . dapagliflozin propanediol (FARXIGA) 5 MG TABS tablet    Sig: Take 5 mg by mouth daily before breakfast.    Dispense:  90 tablet    Refill:  1    Note new sig - to replace 10mg  dose   No orders of the defined types were placed in this encounter.   Patient Instructions  You are doing well today.  Once you run out of current farxiga dose, try lower 5mg  dose to see if it will help urinary symptoms.  Keep follow up appointment, let me know if new weakness or unsteadiness develops.    Follow up plan: Return if symptoms worsen or fail to improve.  Ria Bush, MD

## 2019-10-21 NOTE — Assessment & Plan Note (Signed)
Overall well controlled, latest A1c 6.1% during hospitalization.

## 2019-10-21 NOTE — Assessment & Plan Note (Addendum)
Ongoing trouble. Will trial lower farxiga dose. Update with effect. Recent UA normal.

## 2019-10-21 NOTE — Patient Instructions (Signed)
You are doing well today.  Once you run out of current farxiga dose, try lower 5mg  dose to see if it will help urinary symptoms.  Keep follow up appointment, let me know if new weakness or unsteadiness develops.

## 2019-10-21 NOTE — Assessment & Plan Note (Signed)
Endorses some neuropathy at toes. Discussed impaired proprioception and contribution to unsteadiness.

## 2019-10-23 DIAGNOSIS — N183 Type 2 diabetes mellitus with diabetic chronic kidney disease: Secondary | ICD-10-CM | POA: Insufficient documentation

## 2019-10-30 ENCOUNTER — Other Ambulatory Visit: Payer: Self-pay | Admitting: Family Medicine

## 2019-11-30 ENCOUNTER — Other Ambulatory Visit: Payer: Self-pay | Admitting: Family Medicine

## 2020-01-28 ENCOUNTER — Encounter: Payer: Self-pay | Admitting: Family Medicine

## 2020-01-28 DIAGNOSIS — E1140 Type 2 diabetes mellitus with diabetic neuropathy, unspecified: Secondary | ICD-10-CM

## 2020-01-28 DIAGNOSIS — E1122 Type 2 diabetes mellitus with diabetic chronic kidney disease: Secondary | ICD-10-CM

## 2020-01-29 ENCOUNTER — Other Ambulatory Visit: Payer: Self-pay

## 2020-01-29 ENCOUNTER — Other Ambulatory Visit (INDEPENDENT_AMBULATORY_CARE_PROVIDER_SITE_OTHER): Payer: 59

## 2020-01-29 DIAGNOSIS — N183 Chronic kidney disease, stage 3 unspecified: Secondary | ICD-10-CM

## 2020-01-29 DIAGNOSIS — E1122 Type 2 diabetes mellitus with diabetic chronic kidney disease: Secondary | ICD-10-CM

## 2020-01-29 DIAGNOSIS — E1140 Type 2 diabetes mellitus with diabetic neuropathy, unspecified: Secondary | ICD-10-CM

## 2020-01-29 NOTE — Addendum Note (Signed)
Addended by: Cloyd Stagers on: 01/29/2020 10:22 AM   Modules accepted: Orders

## 2020-01-29 NOTE — Telephone Encounter (Addendum)
Lvm asking pt to call back.  Need to schedule non-fasting lab visit for today.   Also, sent MyChart message.

## 2020-01-29 NOTE — Addendum Note (Signed)
Addended by: Ria Bush on: 01/29/2020 09:05 AM   Modules accepted: Orders

## 2020-01-29 NOTE — Telephone Encounter (Signed)
Please call to schedule nonfasting labs for today.

## 2020-01-30 ENCOUNTER — Other Ambulatory Visit: Payer: Self-pay

## 2020-01-30 ENCOUNTER — Encounter: Payer: Self-pay | Admitting: Family Medicine

## 2020-01-30 ENCOUNTER — Ambulatory Visit: Payer: 59 | Admitting: Family Medicine

## 2020-01-30 VITALS — BP 122/70 | HR 75 | Temp 96.2°F | Ht 73.5 in | Wt 246.8 lb

## 2020-01-30 DIAGNOSIS — N3941 Urge incontinence: Secondary | ICD-10-CM

## 2020-01-30 DIAGNOSIS — Z23 Encounter for immunization: Secondary | ICD-10-CM | POA: Diagnosis not present

## 2020-01-30 DIAGNOSIS — R21 Rash and other nonspecific skin eruption: Secondary | ICD-10-CM | POA: Insufficient documentation

## 2020-01-30 DIAGNOSIS — E1121 Type 2 diabetes mellitus with diabetic nephropathy: Secondary | ICD-10-CM

## 2020-01-30 DIAGNOSIS — E1140 Type 2 diabetes mellitus with diabetic neuropathy, unspecified: Secondary | ICD-10-CM | POA: Diagnosis not present

## 2020-01-30 DIAGNOSIS — N183 Chronic kidney disease, stage 3 unspecified: Secondary | ICD-10-CM

## 2020-01-30 DIAGNOSIS — E1122 Type 2 diabetes mellitus with diabetic chronic kidney disease: Secondary | ICD-10-CM | POA: Diagnosis not present

## 2020-01-30 LAB — RENAL FUNCTION PANEL
Albumin: 4.1 g/dL (ref 3.6–5.1)
BUN/Creatinine Ratio: 12 (calc) (ref 6–22)
BUN: 19 mg/dL (ref 7–25)
CO2: 27 mmol/L (ref 20–32)
Calcium: 9.4 mg/dL (ref 8.6–10.3)
Chloride: 105 mmol/L (ref 98–110)
Creat: 1.54 mg/dL — ABNORMAL HIGH (ref 0.70–1.25)
Glucose, Bld: 197 mg/dL — ABNORMAL HIGH (ref 65–99)
Phosphorus: 3.6 mg/dL (ref 2.1–4.3)
Potassium: 4.5 mmol/L (ref 3.5–5.3)
Sodium: 140 mmol/L (ref 135–146)

## 2020-01-30 LAB — HEMOGLOBIN A1C
Hgb A1c MFr Bld: 7.1 % of total Hgb — ABNORMAL HIGH (ref <5.7)
Mean Plasma Glucose: 157 (calc)
eAG (mmol/L): 8.7 (calc)

## 2020-01-30 LAB — POCT SKIN KOH: Skin KOH, POC: POSITIVE — AB

## 2020-01-30 MED ORDER — CLOTRIMAZOLE 1 % EX CREA: 1.0000 "application " | TOPICAL_CREAM | Freq: Two times a day (BID) | CUTANEOUS | 0 refills | Status: DC

## 2020-01-30 MED ORDER — GLIMEPIRIDE 4 MG PO TABS: 4.0000 mg | ORAL_TABLET | Freq: Every day | ORAL | 1 refills | Status: DC

## 2020-01-30 NOTE — Assessment & Plan Note (Addendum)
Chronic, deterioration with A1c 6.1->7.1 in setting of lowering farxiga dose. He will look into ongoing assistance for farxiga through pharmacy, let me know if medication becomes unaffordable to consider medication change. In interim, will increase amaryl to 4mg  daily with breakfast, reassess at 60mo f/u visit.  Consider weekly GLP1RA.

## 2020-01-30 NOTE — Progress Notes (Signed)
This visit was conducted in person.  BP 122/70 (BP Location: Left Arm, Patient Position: Sitting, Cuff Size: Normal)   Pulse 75   Temp (!) 96.2 F (35.7 C) (Temporal)   Ht 6' 1.5" (1.867 m)   Wt 246 lb 12.8 oz (111.9 kg)   SpO2 96%   BMI 32.12 kg/m    CC: 3 mo f/u visit  Subjective:    Patient ID: Kirk Bennett., male    DOB: Apr 02, 1951, 69 y.o.   MRN: [id number]  HPI: Kirk Bennett. is a 69 y.o. male presenting on 01/30/2020 for Follow-up (A1C 7.1  Pt states that his BS has been elevated since last OV - ranging 160s-upper 200s)   DM - does regularly check sugars 160-200s. Compliant with antihyperglycemic regimen which includes: farxiga 27m daily (recent decrease), amaryl 277mdaily, metformin 100064mid. Denies low sugars or hypoglycemic symptoms. Denies paresthesias. Last diabetic eye exam DUE. Pneumovax: 2013, due for final. Prevnar: 2019. Glucometer brand: one-touch. DSME: completed previously. Ongoing urinary urgency throughout the day - no benefit with lower farxiga dose.  Lab Results  Component Value Date   HGBA1C 7.1 (H) 01/29/2020   Diabetic Foot Exam - Simple   Simple Foot Form Diabetic Foot exam was performed with the following findings: Yes 01/30/2020  9:12 AM  Visual Inspection See comments: Yes Sensation Testing Intact to touch and monofilament testing bilaterally: Yes Pulse Check Posterior Tibialis and Dorsalis pulse intact bilaterally: Yes Comments Dry scaly skin to bilateral soles and feet    Lab Results  Component Value Date   MICROALBUR <0.2 09/11/2015         Relevant past medical, surgical, family and social history reviewed and updated as indicated. Interim medical history since our last visit reviewed. Allergies and medications reviewed and updated. Outpatient Medications Prior to Visit  Medication Sig Dispense Refill  . atorvastatin (LIPITOR) 20 MG tablet TAKE 1 TABLET BY MOUTH EVERY DAY 90 tablet 3  . Cholecalciferol (VITAMIN D3) 1000  units CAPS Take 1,000 Units by mouth daily.    . dapagliflozin propanediol (FARXIGA) 5 MG TABS tablet Take 5 mg by mouth daily before breakfast. 90 tablet 1  . glucose blood (ONE TOUCH ULTRA TEST) test strip Use to check sugar fasting in the AM and 2 hours after lunch or supper. Dx: E11.21 100 each 3  . levETIRAcetam (KEPPRA) 500 MG tablet Take 500 mg by mouth as directed. 2 tab AM, 3 tab PM    . lisinopril (ZESTRIL) 10 MG tablet TAKE 1 TABLET BY MOUTH TWICE A DAY 180 tablet 3  . metFORMIN (GLUCOPHAGE) 1000 MG tablet TAKE 1 TABLET BY MOUTH TWICE A DAY WITH MEALS 180 tablet 1  . tamsulosin (FLOMAX) 0.4 MG CAPS capsule TAKE 1 CAPSULE BY MOUTH EVERY DAY 90 capsule 3  . glimepiride (AMARYL) 2 MG tablet TAKE 1 TABLET BY MOUTH DAILY WITH BREAKFAST 90 tablet 3   No facility-administered medications prior to visit.     Per HPI unless specifically indicated in ROS section below Review of Systems Objective:  BP 122/70 (BP Location: Left Arm, Patient Position: Sitting, Cuff Size: Normal)   Pulse 75   Temp (!) 96.2 F (35.7 C) (Temporal)   Ht 6' 1.5" (1.867 m)   Wt 246 lb 12.8 oz (111.9 kg)   SpO2 96%   BMI 32.12 kg/m   Wt Readings from Last 3 Encounters:  01/30/20 246 lb 12.8 oz (111.9 kg)  10/21/19 248 lb 7 oz (112.7  kg)  10/14/19 248 lb 14.4 oz (112.9 kg)      Physical Exam Vitals and nursing note reviewed.  Constitutional:      General: He is not in acute distress.    Appearance: Normal appearance. He is well-developed. He is not ill-appearing.  HENT:     Head: Normocephalic and atraumatic.  Eyes:     General: No scleral icterus.    Extraocular Movements: Extraocular movements intact.     Conjunctiva/sclera: Conjunctivae normal.     Pupils: Pupils are equal, round, and reactive to light.  Cardiovascular:     Rate and Rhythm: Normal rate and regular rhythm.     Pulses: Normal pulses.     Heart sounds: Normal heart sounds. No murmur heard.   Pulmonary:     Effort: Pulmonary  effort is normal. No respiratory distress.     Breath sounds: Normal breath sounds. No wheezing, rhonchi or rales.  Musculoskeletal:     Cervical back: Normal range of motion and neck supple.     Right lower leg: No edema.     Left lower leg: No edema.     Comments: See HPI for foot exam if done  Lymphadenopathy:     Cervical: No cervical adenopathy.  Skin:    General: Skin is warm and dry.     Findings: No rash.  Neurological:     Mental Status: He is alert.  Psychiatric:        Mood and Affect: Mood normal.        Behavior: Behavior normal.       Results for orders placed or performed in visit on 01/30/20  POCT Skin KOH  Result Value Ref Range   Skin KOH, POC Positive (A) Negative   Assessment & Plan:  This visit occurred during the SARS-CoV-2 public health emergency.  Safety protocols were in place, including screening questions prior to the visit, additional usage of staff PPE, and extensive cleaning of exam room while observing appropriate contact time as indicated for disinfecting solutions.   Problem List Items Addressed This Visit    Urge urinary incontinence    No noted improvement on lower farxiga dose.       Type 2 diabetes mellitus with diabetic neuropathy, unspecified (HCC) - Primary   Relevant Medications   glimepiride (AMARYL) 4 MG tablet   Rash of foot    KOH mildly positive  Possible tinea pedis  Trial clotrimazole BID x 2-3 weeks, update with effect.      Relevant Orders   POCT Skin KOH (Completed)   Controlled type 2 diabetes mellitus with diabetic nephropathy (HCC)    Chronic, deterioration with A1c 6.1->7.1 in setting of lowering farxiga dose. He will look into ongoing assistance for farxiga through pharmacy, let me know if medication becomes unaffordable to consider medication change. In interim, will increase amaryl to 4m daily with breakfast, reassess at 345mo/u visit.  Consider weekly GLP1RA.       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       Relevant Medications   glimepiride (AMARYL) 4 MG tablet    Other Visit Diagnoses    Need for pneumococcal vaccination       Relevant Orders   Pneumococcal polysaccharide vaccine 23-valent greater than or equal to 2yo subcutaneous/IM (Completed)       Meds  ordered this encounter  Medications  . glimepiride (AMARYL) 4 MG tablet    Sig: Take 1 tablet (4 mg total) by mouth daily with breakfast.    Dispense:  90 tablet    Refill:  1    Note new dose  . clotrimazole (LOTRIMIN AF) 1 % cream    Sig: Apply 1 application topically 2 (two) times daily.    Dispense:  113 g    Refill:  0   Orders Placed This Encounter  Procedures  . Pneumococcal polysaccharide vaccine 23-valent greater than or equal to 2yo subcutaneous/IM  . POCT Skin KOH    Patient Instructions  Schedule eye exam at your convenience Continue farxiga 36m daily, increase amaryl to 441mdaily.  Final pneumovax today.  Use clotrimazole twice daily for 2-3 weeks to feet to see if any improvement in scaly dry skin of feet. Schedule follow up visit in 3 months, prior fasting labwork visit as well.    Follow up plan: Return in about 3 months (around 05/01/2020) for follow up visit.  JaRia BushMD

## 2020-01-30 NOTE — Assessment & Plan Note (Addendum)
Renal function slightly improved, stable with eGFR 55.

## 2020-01-30 NOTE — Assessment & Plan Note (Signed)
No noted improvement on lower farxiga dose.

## 2020-01-30 NOTE — Patient Instructions (Addendum)
Schedule eye exam at your convenience Continue farxiga 5mg  daily, increase amaryl to 4mg  daily.  Final pneumovax today.  Use clotrimazole twice daily for 2-3 weeks to feet to see if any improvement in scaly dry skin of feet. Schedule follow up visit in 3 months, prior fasting labwork visit as well.

## 2020-01-30 NOTE — Assessment & Plan Note (Signed)
KOH mildly positive  Possible tinea pedis  Trial clotrimazole BID x 2-3 weeks, update with effect.

## 2020-03-19 ENCOUNTER — Other Ambulatory Visit: Payer: Self-pay

## 2020-03-19 ENCOUNTER — Encounter: Payer: Self-pay | Admitting: Family Medicine

## 2020-03-19 ENCOUNTER — Ambulatory Visit: Payer: 59 | Admitting: Family Medicine

## 2020-03-19 VITALS — BP 122/70 | HR 87 | Temp 97.9°F | Ht 73.5 in | Wt 247.4 lb

## 2020-03-19 DIAGNOSIS — N3941 Urge incontinence: Secondary | ICD-10-CM

## 2020-03-19 DIAGNOSIS — R350 Frequency of micturition: Secondary | ICD-10-CM

## 2020-03-19 DIAGNOSIS — E1121 Type 2 diabetes mellitus with diabetic nephropathy: Secondary | ICD-10-CM

## 2020-03-19 DIAGNOSIS — E1140 Type 2 diabetes mellitus with diabetic neuropathy, unspecified: Secondary | ICD-10-CM

## 2020-03-19 DIAGNOSIS — R972 Elevated prostate specific antigen [PSA]: Secondary | ICD-10-CM

## 2020-03-19 DIAGNOSIS — G40109 Localization-related (focal) (partial) symptomatic epilepsy and epileptic syndromes with simple partial seizures, not intractable, without status epilepticus: Secondary | ICD-10-CM

## 2020-03-19 MED ORDER — MIRABEGRON ER 25 MG PO TB24: 25.0000 mg | ORAL_TABLET | Freq: Every day | ORAL | 3 refills | Status: DC

## 2020-03-19 NOTE — Assessment & Plan Note (Signed)
Evidence of diabetic peripheral neuropathy on prior exams

## 2020-03-19 NOTE — Progress Notes (Signed)
This visit was conducted in person.  BP 122/70 (BP Location: Left Arm, Patient Position: Sitting, Cuff Size: Normal)   Pulse 87   Temp 97.9 F (36.6 C) (Temporal)   Ht 6' 1.5" (1.867 m)   Wt (!) 247 lb 6 oz (112.2 kg)   SpO2 96%   BMI 32.19 kg/m    CC: worsening urinary frequency Subjective:    Patient ID: Kirk Buttery., male    DOB: 1950-11-18, 69 y.o.   MRN: [id number]  HPI: Kirk Nylund. is a 69 y.o. male presenting on 03/19/2020 for Urinary Frequency   Ongoing urinary symptoms specifically urgency and frequency throughout the day - with new urinary incontinence - does not feel urge to urinate and can have accident. Has had full urine leakage, latest recently while at [facility]. No improvement with trial of lower farxiga dose 5mg . Progressively worsening - to point of needing to use depends for last several months. Feels incompletely emptying.   No stress incontinence symptoms.   Nocturia x2.  No dysuria, fevers, urine odor, hematuria. No lower back or rectal pain.   Tamsulosin does not help - has continued taking regardless.  No significant cough. No significant constipation.   Saw nephrology Holley Raring) 2 wks ago. Told urine was ok - I don't have records available.   Last saw urology 04/2017 for transient PSA elevation. Lab Results  Component Value Date   PSA 1.6 09/20/2019   PSA 2.0 09/18/2018   PSA 3.0 09/11/2017   Lab Results  Component Value Date   HGBA1C 7.1 (H) 01/29/2020       Relevant past medical, surgical, family and social history reviewed and updated as indicated. Interim medical history since our last visit reviewed. Allergies and medications reviewed and updated. Outpatient Medications Prior to Visit  Medication Sig Dispense Refill  . atorvastatin (LIPITOR) 20 MG tablet TAKE 1 TABLET BY MOUTH EVERY DAY 90 tablet 3  . Cholecalciferol (VITAMIN D3) 1000 units CAPS Take 1,000 Units by mouth daily.    . clotrimazole (LOTRIMIN AF) 1 % cream  Apply 1 application topically 2 (two) times daily. 113 g 0  . dapagliflozin propanediol (FARXIGA) 5 MG TABS tablet Take 5 mg by mouth daily before breakfast. 90 tablet 1  . glimepiride (AMARYL) 4 MG tablet Take 1 tablet (4 mg total) by mouth daily with breakfast. 90 tablet 1  . glucose blood (ONE TOUCH ULTRA TEST) test strip Use to check sugar fasting in the AM and 2 hours after lunch or supper. Dx: E11.21 100 each 3  . levETIRAcetam (KEPPRA) 500 MG tablet Take 500 mg by mouth as directed. 2 tab AM, 3 tab PM    . lisinopril (ZESTRIL) 10 MG tablet TAKE 1 TABLET BY MOUTH TWICE A DAY 180 tablet 3  . metFORMIN (GLUCOPHAGE) 1000 MG tablet TAKE 1 TABLET BY MOUTH TWICE A DAY WITH MEALS 180 tablet 1  . tamsulosin (FLOMAX) 0.4 MG CAPS capsule TAKE 1 CAPSULE BY MOUTH EVERY DAY 90 capsule 3   No facility-administered medications prior to visit.     Per HPI unless specifically indicated in ROS section below Review of Systems Objective:  BP 122/70 (BP Location: Left Arm, Patient Position: Sitting, Cuff Size: Normal)   Pulse 87   Temp 97.9 F (36.6 C) (Temporal)   Ht 6' 1.5" (1.867 m)   Wt (!) 247 lb 6 oz (112.2 kg)   SpO2 96%   BMI 32.19 kg/m   Wt Readings from Last  3 Encounters:  03/19/20 (!) 247 lb 6 oz (112.2 kg)  01/30/20 246 lb 12.8 oz (111.9 kg)  10/21/19 248 lb 7 oz (112.7 kg)      Physical Exam Vitals and nursing note reviewed.  Constitutional:      Appearance: Normal appearance. He is not ill-appearing.  Abdominal:     General: Abdomen is protuberant. Bowel sounds are normal. There is no distension.     Palpations: Abdomen is soft. There is no mass.     Tenderness: There is no abdominal tenderness. There is no right CVA tenderness, left CVA tenderness, guarding or rebound.     Hernia: No hernia is present.  Musculoskeletal:     Comments:  No lower back pain to palpation  No paraspinous mm tenderness to palpation   Skin:    Findings: No rash.  Neurological:     Mental Status:  He is alert.       Results for orders placed or performed in visit on 01/30/20  POCT Skin KOH  Result Value Ref Range   Skin KOH, POC Positive (A) Negative   Assessment & Plan:  This visit occurred during the SARS-CoV-2 public health emergency.  Safety protocols were in place, including screening questions prior to the visit, additional usage of staff PPE, and extensive cleaning of exam room while observing appropriate contact time as indicated for disinfecting solutions.   Problem List Items Addressed This Visit    Urge urinary incontinence - Primary    Symptoms present since early this year. ?neurogenic bladder. Describes overactive bladder symptoms with possible overflow incontinence.  No improvement on lower farxiga. Stop this along with flomax.  Trial myrbetriq.  Refer to uro for further evaluation.  No stress incontinence symptoms.  Unable to provide urine sample today - will return for this.       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 Recent head CT 09/2019 unrevealing. Continues keppra 1000mg  bid.       Elevated PSA    PSA levels have stabilized.  DRE 09/2019 with evidence of mildly enlarged prostate. DRE not repeated today.  Will stop flomax - not effective for current symptoms.       Controlled type 2 diabetes mellitus with diabetic nephropathy ([place])    Sugars have been largely well controlled for several years now with A1c ranging 6s to low 7s.  Will stop any med which could contribute to urinary frequency/incontinence while we further evaluate symptoms - including farxiga.        Other Visit Diagnoses    Urinary frequency           Meds ordered this  encounter  Medications  . mirabegron ER (MYRBETRIQ) 25 MG TB24 tablet    Sig: Take 1 tablet (25 mg total) by mouth daily.    Dispense:  30 tablet    Refill:  3   Orders Placed This Encounter  Procedures  . Ambulatory referral to Urology    Referral Priority:   Routine    Referral Type:   Consultation    Referral Reason:   Specialty Services Required    Requested Specialty:   Urology    Number of  Visits Requested:   1    Patient Instructions  Stop flomax, stop farxiga.  Trial myrbetriq 25mg  daily for urinary issues.  Urinalysis check today.  We will refer you to urology as well for further evaluation [facility]).    Follow up plan: Return if symptoms worsen or fail to improve.  Ria Bush, MD

## 2020-03-19 NOTE — Assessment & Plan Note (Signed)
H/o this. Recent head CT 09/2019 unrevealing. Continues keppra 1000mg  bid.

## 2020-03-19 NOTE — Patient Instructions (Addendum)
Stop flomax, stop farxiga.  Trial myrbetriq 25mg  daily for urinary issues.  Urinalysis check today.  We will refer you to urology as well for further evaluation [facility]).

## 2020-03-19 NOTE — Assessment & Plan Note (Addendum)
PSA levels have stabilized.  DRE 09/2019 with evidence of mildly enlarged prostate. DRE not repeated today.  Will stop flomax - not effective for current symptoms.

## 2020-03-19 NOTE — Assessment & Plan Note (Signed)
Sugars have been largely well controlled for several years now with A1c ranging 6s to low 7s.  Will stop any med which could contribute to urinary frequency/incontinence while we further evaluate symptoms - including farxiga.

## 2020-03-19 NOTE — Assessment & Plan Note (Addendum)
Symptoms present since early this year. ?neurogenic bladder. Describes overactive bladder symptoms with possible overflow incontinence.  No improvement on lower farxiga. Stop this along with flomax.  Trial myrbetriq.  Refer to uro for further evaluation.  No stress incontinence symptoms.  Unable to provide urine sample today - will return for this.

## 2020-03-26 ENCOUNTER — Ambulatory Visit (INDEPENDENT_AMBULATORY_CARE_PROVIDER_SITE_OTHER): Payer: 59 | Admitting: Urology

## 2020-03-26 ENCOUNTER — Encounter: Payer: Self-pay | Admitting: Urology

## 2020-03-26 ENCOUNTER — Other Ambulatory Visit: Payer: Self-pay

## 2020-03-26 VITALS — BP 146/79 | HR 87 | Ht 73.0 in | Wt 245.0 lb

## 2020-03-26 DIAGNOSIS — N3941 Urge incontinence: Secondary | ICD-10-CM

## 2020-03-26 DIAGNOSIS — N3281 Overactive bladder: Secondary | ICD-10-CM | POA: Diagnosis not present

## 2020-03-26 LAB — BLADDER SCAN AMB NON-IMAGING

## 2020-03-26 MED ORDER — OXYBUTYNIN CHLORIDE ER 10 MG PO TB24: 10.0000 mg | ORAL_TABLET | Freq: Every day | ORAL | 11 refills | Status: DC

## 2020-03-26 NOTE — Patient Instructions (Signed)
1. Try to urinate on a schedule every 2-3 hours to prevent urgency and leakage 2. The medication will take a few weeks to work, so be patient  Overactive Bladder, Adult  Overactive bladder refers to a condition in which a person has a sudden need to pass urine. The person may leak urine if he or she cannot get to the bathroom fast enough (urinary incontinence). A person with this condition may also wake up several times in the night to go to the bathroom. Overactive bladder is associated with poor nerve signals between your bladder and your brain. Your bladder may get the signal to empty before it is full. You may also have very sensitive muscles that make your bladder squeeze too soon. These symptoms might interfere with daily work or social activities. What are the causes? This condition may be associated with or caused by:  Urinary tract infection.  Infection of nearby tissues, such as the prostate.  Prostate enlargement.  Surgery on the uterus or urethra.  Bladder stones, inflammation, or tumors.  Drinking too much caffeine or alcohol.  Certain medicines, especially medicines that get rid of extra fluid in the body (diuretics).  Muscle or nerve weakness, especially from: ? A spinal cord injury. ? Stroke. ? Multiple sclerosis. ? Parkinson's disease.  Diabetes.  Constipation. What increases the risk? You may be at greater risk for overactive bladder if you:  Are an older adult.  Smoke.  Are going through menopause.  Have prostate problems.  Have a neurological disease, such as stroke, dementia, Parkinson's disease, or multiple sclerosis (MS).  Eat or drink things that irritate the bladder. These include alcohol, spicy food, and caffeine.  Are overweight or obese. What are the signs or symptoms? Symptoms of this condition include:  Sudden, strong urge to urinate.  Leaking urine.  Urinating 8 or more times a day.  Waking up to urinate 2 or more times a  night. How is this diagnosed? Your health care provider may suspect overactive bladder based on your symptoms. He or she will diagnose this condition by:  A physical exam and medical history.  Blood or urine tests. You might need bladder or urine tests to help determine what is causing your overactive bladder. You might also need to see a health care provider who specializes in urinary tract problems (urologist). How is this treated? Treatment for overactive bladder depends on the cause of your condition and whether it is mild or severe. You can also make lifestyle changes at home. Options include:  Bladder training. This may include: ? Learning to control the urge to urinate by following a schedule that directs you to urinate at regular intervals (timed voiding). ? Doing Kegel exercises to strengthen your pelvic floor muscles, which support your bladder. Toning these muscles can help you control urination, even if your bladder muscles are overactive.  Special devices. This may include: ? Biofeedback, which uses sensors to help you become aware of your body's signals. ? Electrical stimulation, which uses electrodes placed inside the body (implanted) or outside the body. These electrodes send gentle pulses of electricity to strengthen the nerves or muscles that control the bladder. ? Women may use a plastic device that fits into the vagina and supports the bladder (pessary).  Medicines. ? Antibiotics to treat bladder infection. ? Antispasmodics to stop the bladder from releasing urine at the wrong time. ? Tricyclic antidepressants to relax bladder muscles. ? Injections of botulinum toxin type A directly into the bladder tissue to  relax bladder muscles.  Lifestyle changes. This may include: ? Weight loss. Talk to your health care provider about weight loss methods that would work best for you. ? Diet changes. This may include reducing how much alcohol and caffeine you consume, or drinking  fluids at different times of the day. ? Not smoking. Do not use any products that contain nicotine or tobacco, such as cigarettes and e-cigarettes. If you need help quitting, ask your health care provider.  Surgery. ? A device may be implanted to help manage the nerve signals that control urination. ? An electrode may be implanted to stimulate electrical signals in the bladder. ? A procedure may be done to change the shape of the bladder. This is done only in very severe cases. Follow these instructions at home: Lifestyle  Make any diet or lifestyle changes that are recommended by your health care provider. These may include: ? Drinking less fluid or drinking fluids at different times of the day. ? Cutting down on caffeine or alcohol. ? Doing Kegel exercises. ? Losing weight if needed. ? Eating a healthy and balanced diet to prevent constipation. This may include:  Eating foods that are high in fiber, such as fresh fruits and vegetables, whole grains, and beans.  Limiting foods that are high in fat and processed sugars, such as fried and sweet foods. General instructions  Take over-the-counter and prescription medicines only as told by your health care provider.  If you were prescribed an antibiotic medicine, take it as told by your health care provider. Do not stop taking the antibiotic even if you start to feel better.  Use any implants or pessary as told by your health care provider.  If needed, wear pads to absorb urine leakage.  Keep a journal or log to track how much and when you drink and when you feel the need to urinate. This will help your health care provider monitor your condition.  Keep all follow-up visits as told by your health care provider. This is important. Contact a health care provider if:  You have a fever.  Your symptoms do not get better with treatment.  Your pain and discomfort get worse.  You have more frequent urges to urinate. Get help right away  if:  You are not able to control your bladder. Summary  Overactive bladder refers to a condition in which a person has a sudden need to pass urine.  Several conditions may lead to an overactive bladder.  Treatment for overactive bladder depends on the cause and severity of your condition.  Follow your health care provider's instructions about lifestyle changes, doing Kegel exercises, keeping a journal, and taking medicines. This information is not intended to replace advice given to you by your health care provider. Make sure you discuss any questions you have with your health care provider. Document Revised: 11/29/2018 Document Reviewed: 08/24/2017 Elsevier Patient Education  [place].

## 2020-03-26 NOTE — Progress Notes (Signed)
03/26/20 1:01 PM   [name] 1950/11/27 [id number]  CC: Urgency and urge incontinence  HPI: I saw Mr. Kirk Bennett in urology clinic today for evaluation of urgency and urge incontinence.  He reports a few months of moderate to severe urinary urgency, frequency, and urge incontinence that is very bothersome and requires depends.  His past medical history is notable for diabetes and morbid obesity.  He was tried on Flomax which he does not think improved his symptoms at all.  His PCP also prescribed Myrbetriq, but he did not fill this medication secondary to cost.  He denies any gross hematuria, dysuria, or weak stream.  He has nocturia 1-2 times overnight.  He drinks only water during the day.  Unable to void for urinalysis today, PVR normal at 95 mL.  Prior urinalysis in February was completely benign.   PMH: Past Medical History:  Diagnosis Date  . Cataracts, bilateral 02/2011   and suspected glaucoma, to return for f/u, no diabetic retinopathy  . CKD stage 3 due to type 2 diabetes mellitus ([place]) 2015   normal renal US, self referred to Dr Holley Raring  . Complex partial seizures ([place]) 1990   from surgery for R temporal arachnoid cyst s/p drainage, possible continued sz so changed to lamotrigine ([facility]. Mora Bellman at [facility])  . Depression    found by neuro  . Elevated PSA 05/27/2015   Serial monitoring ([place])   . Glaucoma 2015   suspect  . History of hepatitis A   . HLD (hyperlipidemia)   . HTN (hypertension)   . Knee pain    s/p replacement  . SVT (supraventricular tachycardia) ([place]) 1996  . Vitamin D deficiency 03/02/2015  . Well controlled type 2 diabetes mellitus with nephropathy ([place]) 1996   established with Dr. Gabriel Carina endo --> 02/2016 decided to return to PCP for DM care    Surgical History: Past Surgical History:  Procedure Laterality Date  . CARDIAC CATHETERIZATION  July 2010   No blockages (Dr. Liliane Shi)  . COLONOSCOPY  09/16/2005   hyperplastic polyps, rpt due 10  yrs   . COLONOSCOPY WITH PROPOFOL N/A 12/11/2015   mult polyps, few TA, rpt 58yrs ([place])  . corrective surgery amblyopia  1957  . Cystectomy or meningioma removal brain  1990   (unclear)  . Left knee surgery  1971   Torn ACL  . 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 Garald Balding)    Family History: Family History  Problem Relation Age of Onset  . Heart failure Father   . Cancer Mother        Lung, smoker  . Aortic dissection Mother        Aortic rupture  . Mental illness Sister        30 years old  . Diabetes Maternal Aunt   . Diabetes Cousin   . Cancer Cousin        Colon age 80  . CAD Neg Hx     Social History:  reports that he has never smoked. He has never used smokeless tobacco. He reports that he does not drink alcohol and does not use drugs.  Physical Exam: BP (!) 146/79   Pulse 87   Ht 6\' 1"  (1.854 m)   Wt 245 lb (111.1 kg)   BMI 32.32 kg/m    Constitutional:  Alert and oriented, No acute distress. Cardiovascular: No clubbing, cyanosis, or edema. Respiratory: Normal respiratory effort, no increased work of breathing. GI: Abdomen  is soft, nontender, nondistended, no abdominal masses GU: Phallus with patent meatus, testicles 20 cc and descended bilaterally without masses  Laboratory Data: Reviewed, see HPI  Pertinent Imaging: None to review  Assessment & Plan:   69 year old male with diabetes and morbid obesity and moderate to severe overactive bladder symptoms.  We discussed that overactive bladder (OAB) is not a disease, but is a symptom complex that is generally not life-threatening.  Symptoms typically include urinary urgency, frequency, and urge incontinence.  There are numerous treatment options, however there are risks and benefits with both medical and surgical management.  First-line treatment is behavioral therapies including bladder training, pelvic floor muscle training, and fluid management.  Second line treatments include oral  antimuscarinics(Ditropan er, Trospium) and beta-3 agonist (Mybetriq). There is typically a period of medication trial (4-8 weeks) to find the optimal therapy and dosing. If symptoms are bothersome despite the above management, third line options include intra-detrusor botox, peripheral tibial nerve stimulation (PTNS), and interstim (SNS). These are more invasive treatments with higher side effect profile, but may improve quality of life for patients with severe OAB symptoms.   I recommended a trial of oxybutynin 10 mg XL, risks and benefits discussed at length Follow-up in 6 weeks for symptom check   Nickolas Madrid, MD 03/26/2020  [facility] [address] [place], [place] [address] [phone]

## 2020-04-02 ENCOUNTER — Other Ambulatory Visit: Payer: Self-pay | Admitting: Family Medicine

## 2020-04-26 ENCOUNTER — Other Ambulatory Visit: Payer: Self-pay | Admitting: Family Medicine

## 2020-04-26 DIAGNOSIS — E1122 Type 2 diabetes mellitus with diabetic chronic kidney disease: Secondary | ICD-10-CM

## 2020-04-26 DIAGNOSIS — E1121 Type 2 diabetes mellitus with diabetic nephropathy: Secondary | ICD-10-CM

## 2020-04-26 NOTE — Addendum Note (Signed)
Addended by: Ria Bush on: 04/26/2020 12:58 PM   Modules accepted: Orders

## 2020-04-28 ENCOUNTER — Telehealth (INDEPENDENT_AMBULATORY_CARE_PROVIDER_SITE_OTHER): Payer: 59

## 2020-04-28 ENCOUNTER — Other Ambulatory Visit: Payer: Self-pay

## 2020-04-28 ENCOUNTER — Other Ambulatory Visit (INDEPENDENT_AMBULATORY_CARE_PROVIDER_SITE_OTHER): Payer: 59

## 2020-04-28 DIAGNOSIS — E1121 Type 2 diabetes mellitus with diabetic nephropathy: Secondary | ICD-10-CM

## 2020-04-28 DIAGNOSIS — N3941 Urge incontinence: Secondary | ICD-10-CM

## 2020-04-28 DIAGNOSIS — E1122 Type 2 diabetes mellitus with diabetic chronic kidney disease: Secondary | ICD-10-CM

## 2020-04-28 LAB — POC URINALSYSI DIPSTICK (AUTOMATED)
Bilirubin, UA: NEGATIVE
Blood, UA: NEGATIVE
Glucose, UA: POSITIVE — AB
Ketones, UA: NEGATIVE
Leukocytes, UA: NEGATIVE
Nitrite, UA: NEGATIVE
Protein, UA: NEGATIVE
Spec Grav, UA: 1.025
Urobilinogen, UA: 0.2 U/dL
pH, UA: 5.5

## 2020-04-28 NOTE — Telephone Encounter (Signed)
Spoke with pt relaying Dr. Synthia Innocent message.  Pt verbalizes understanding and states he stopped [place].

## 2020-04-28 NOTE — Telephone Encounter (Addendum)
plz notify urinalysis returned ok - no signs of infection or blood in urine. There was sugar in the urine which could be from [place] - has he restarted farxiga or is this still on hold?

## 2020-04-28 NOTE — Telephone Encounter (Signed)
Pt in office for lab visit this morning and left a urine sample for a UA, per Dr. Darnell Level request.   Ran UA and documented results.

## 2020-04-28 NOTE — Addendum Note (Signed)
Addended by: Ellamae Sia on: 04/28/2020 07:55 AM   Modules accepted: Orders

## 2020-04-30 LAB — PROTEIN ELECTROPHORESIS, SERUM, WITH REFLEX
Albumin ELP: 4 g/dL (ref 3.8–4.8)
Alpha 1: 0.3 g/dL (ref 0.2–0.3)
Alpha 2: 0.8 g/dL (ref 0.5–0.9)
Beta 2: 0.3 g/dL (ref 0.2–0.5)
Beta Globulin: 0.4 g/dL (ref 0.4–0.6)
Gamma Globulin: 0.9 g/dL (ref 0.8–1.7)
Total Protein: 6.8 g/dL (ref 6.1–8.1)

## 2020-04-30 LAB — RENAL FUNCTION PANEL
Albumin: 4.2 g/dL (ref 3.6–5.1)
BUN/Creatinine Ratio: 10 (calc) (ref 6–22)
BUN: 15 mg/dL (ref 7–25)
CO2: 25 mmol/L (ref 20–32)
Calcium: 9.3 mg/dL (ref 8.6–10.3)
Chloride: 104 mmol/L (ref 98–110)
Creat: 1.49 mg/dL — ABNORMAL HIGH (ref 0.70–1.25)
Glucose, Bld: 152 mg/dL — ABNORMAL HIGH (ref 65–99)
Phosphorus: 3.6 mg/dL (ref 2.1–4.3)
Potassium: 4.1 mmol/L (ref 3.5–5.3)
Sodium: 139 mmol/L (ref 135–146)

## 2020-04-30 LAB — HEMOGLOBIN A1C
Hgb A1c MFr Bld: 6.2 % of total Hgb — ABNORMAL HIGH (ref <5.7)
Mean Plasma Glucose: 131 (calc)
eAG (mmol/L): 7.3 (calc)

## 2020-05-05 ENCOUNTER — Encounter: Payer: Self-pay | Admitting: Family Medicine

## 2020-05-05 ENCOUNTER — Other Ambulatory Visit: Payer: Self-pay

## 2020-05-05 ENCOUNTER — Ambulatory Visit: Payer: 59 | Admitting: Family Medicine

## 2020-05-05 VITALS — BP 120/68 | HR 84 | Temp 97.9°F | Ht 73.0 in | Wt 250.1 lb

## 2020-05-05 DIAGNOSIS — N3941 Urge incontinence: Secondary | ICD-10-CM | POA: Diagnosis not present

## 2020-05-05 DIAGNOSIS — E1122 Type 2 diabetes mellitus with diabetic chronic kidney disease: Secondary | ICD-10-CM

## 2020-05-05 DIAGNOSIS — I10 Essential (primary) hypertension: Secondary | ICD-10-CM

## 2020-05-05 DIAGNOSIS — E1121 Type 2 diabetes mellitus with diabetic nephropathy: Secondary | ICD-10-CM | POA: Diagnosis not present

## 2020-05-05 DIAGNOSIS — N183 Chronic kidney disease, stage 3 unspecified: Secondary | ICD-10-CM

## 2020-05-05 DIAGNOSIS — Z23 Encounter for immunization: Secondary | ICD-10-CM | POA: Diagnosis not present

## 2020-05-05 NOTE — Assessment & Plan Note (Signed)
Chronic, stable even with stopping farxiga. Notes ongoing impaired fasting glycemic control. RTC 3 mo DM f/u visit. Will request records from latest eye exam.

## 2020-05-05 NOTE — Patient Instructions (Addendum)
Flu shot today We will request latest eye exam from [facility].  Continue current medicines - sugars were doing great today!  Return as needed or in 3 months for diabetes follow up visit.

## 2020-05-05 NOTE — Progress Notes (Signed)
This visit was conducted in person.  BP 120/68 (BP Location: Left Arm, Patient Position: Sitting, Cuff Size: Normal)   Pulse 84   Temp 97.9 F (36.6 C) (Temporal)   Ht '6\' 1"'  (1.854 m)   Wt 250 lb 1 oz (113.4 kg)   SpO2 95%   BMI 32.99 kg/m    CC: 3 mo DM f/u visit  Subjective:    Patient ID: Kirk Buttery., male    DOB: 04-09-1951, 69 y.o.   MRN: [id number]  HPI: Kirk Lofgren. is a 69 y.o. male presenting on 05/05/2020 for Diabetes (Here for 3 mo f/u.)   Saw Dr Diamantina Providence last month for urinary urge incontinence - moderate to severe overactive bladder - started on oxybutynin XL 22m daily - doing some better with this. Flomax has not helped. Myrbetriq unaffordable. Recent UA normal.   DM - does regularly check sugars - well controlled at home. Compliant with antihyperglycemic regimen which includes: amaryl 474mdaily, metformin 100034mid. Denies low sugars or hypoglycemic symptoms. Denies paresthesias. Last diabetic eye exam 6 mo ago. Pneumovax: 01/2020. Prevnar: 2019. Glucometer brand: one-touch. DSME: completed previously. Lab Results  Component Value Date   HGBA1C 6.2 (H) 04/28/2020   Diabetic Foot Exam - Simple   No data filed     Lab Results  Component Value Date   MICROALBUR <0.2 09/11/2015         Relevant past medical, surgical, family and social history reviewed and updated as indicated. Interim medical history since our last visit reviewed. Allergies and medications reviewed and updated. Outpatient Medications Prior to Visit  Medication Sig Dispense Refill  . atorvastatin (LIPITOR) 20 MG tablet TAKE 1 TABLET BY MOUTH EVERY DAY 90 tablet 3  . Cholecalciferol (VITAMIN D3) 1000 units CAPS Take 1,000 Units by mouth daily.    . clotrimazole (LOTRIMIN AF) 1 % cream Apply 1 application topically 2 (two) times daily. 113 g 0  . glimepiride (AMARYL) 4 MG tablet Take 1 tablet (4 mg total) by mouth daily with breakfast. 90 tablet 1  . glucose blood (ONE TOUCH ULTRA  TEST) test strip Use to check sugar fasting in the AM and 2 hours after lunch or supper. Dx: E11.21 100 each 3  . levETIRAcetam (KEPPRA) 500 MG tablet Take 500 mg by mouth as directed. 2 tab AM, 3 tab PM    . lisinopril (ZESTRIL) 10 MG tablet TAKE 1 TABLET BY MOUTH TWICE A DAY 180 tablet 3  . metFORMIN (GLUCOPHAGE) 1000 MG tablet TAKE 1 TABLET BY MOUTH TWICE A DAY WITH MEALS 180 tablet 1  . oxybutynin (DITROPAN-XL) 10 MG 24 hr tablet Take 1 tablet (10 mg total) by mouth daily. 30 tablet 11  . dapagliflozin propanediol (FARXIGA) 5 MG TABS tablet Take 5 mg by mouth daily before breakfast. 90 tablet 1  . mirabegron ER (MYRBETRIQ) 25 MG TB24 tablet Take 1 tablet (25 mg total) by mouth daily. (Patient not taking: Reported on 05/05/2020) 30 tablet 3  . tamsulosin (FLOMAX) 0.4 MG CAPS capsule TAKE 1 CAPSULE BY MOUTH EVERY DAY 90 capsule 3   No facility-administered medications prior to visit.     Per HPI unless specifically indicated in ROS section below Review of Systems Objective:  BP 120/68 (BP Location: Left Arm, Patient Position: Sitting, Cuff Size: Normal)   Pulse 84   Temp 97.9 F (36.6 C) (Temporal)   Ht '6\' 1"'  (1.854 m)   Wt 250 lb 1 oz (113.4 kg)  SpO2 95%   BMI 32.99 kg/m   Wt Readings from Last 3 Encounters:  05/05/20 250 lb 1 oz (113.4 kg)  03/26/20 245 lb (111.1 kg)  03/19/20 (!) 247 lb 6 oz (112.2 kg)      Physical Exam Vitals and nursing note reviewed.  Constitutional:      Appearance: Normal appearance. He is not ill-appearing.  Cardiovascular:     Rate and Rhythm: Normal rate and regular rhythm.     Pulses: Normal pulses.     Heart sounds: Normal heart sounds. No murmur heard.  No gallop.   Pulmonary:     Effort: Pulmonary effort is normal. No respiratory distress.     Breath sounds: Normal breath sounds. No wheezing, rhonchi or rales.  Musculoskeletal:        General: Normal range of motion.     Right lower leg: No edema.     Left lower leg: No edema.  Skin:     General: Skin is warm and dry.     Findings: No rash.  Neurological:     Mental Status: He is alert.  Psychiatric:        Mood and Affect: Mood normal.        Behavior: Behavior normal.       Results for orders placed or performed in visit on 04/28/20  POCT Urinalysis Dipstick (Automated)  Result Value Ref Range   Color, UA yellow    Clarity, UA clear    Glucose, UA Positive (A) Negative   Bilirubin, UA negative    Ketones, UA negative    Spec Grav, UA 1.025 1.010 - 1.025   Blood, UA negative    pH, UA 5.5 5.0 - 8.0   Protein, UA Negative Negative   Urobilinogen, UA 0.2 0.2 or 1.0 E.U./dL   Nitrite, UA negative    Leukocytes, UA Negative Negative   Assessment & Plan:  This visit occurred during the SARS-CoV-2 public health emergency.  Safety protocols were in place, including screening questions prior to the visit, additional usage of staff PPE, and extensive cleaning of exam room while observing appropriate contact time as indicated for disinfecting solutions.   Problem List Items Addressed This Visit    Urge urinary incontinence    Appreciate uro care - for OAB, now on oxybutynin - continue.       Essential hypertension, benign    Chronic, stable. Continue current regimen.       Controlled type 2 diabetes mellitus with diabetic nephropathy (HCC) - Primary    Chronic, stable even with stopping farxiga. Notes ongoing impaired fasting glycemic control. RTC 3 mo DM f/u visit. Will request records from latest eye exam.       CKD stage 3 due to type 2 diabetes mellitus ([place])    Renal function stable off farxiga. eGFR = 55.        Other Visit Diagnoses    Need for influenza vaccination       Relevant Orders   Flu Vaccine QUAD High Dose(Fluad) (Completed)       No orders of the defined types were placed in this encounter.  Orders Placed This Encounter  Procedures  . Flu Vaccine QUAD High Dose(Fluad)    Patient Instructions  Flu shot today We will request  latest eye exam from [facility].  Continue current medicines - sugars were doing great today!  Return as needed or in 3 months for diabetes follow up visit.    Follow  up plan: Return in about 3 months (around 08/04/2020) for follow up visit.  Ria Bush, MD

## 2020-05-05 NOTE — Assessment & Plan Note (Signed)
Chronic, stable. Continue current regimen. 

## 2020-05-05 NOTE — Assessment & Plan Note (Signed)
Appreciate uro care - for OAB, now on oxybutynin - continue.

## 2020-05-05 NOTE — Assessment & Plan Note (Signed)
Renal function stable off farxiga. eGFR = 55.

## 2020-05-13 ENCOUNTER — Ambulatory Visit (INDEPENDENT_AMBULATORY_CARE_PROVIDER_SITE_OTHER): Payer: 59 | Admitting: Urology

## 2020-05-13 ENCOUNTER — Other Ambulatory Visit: Payer: Self-pay

## 2020-05-13 ENCOUNTER — Encounter: Payer: Self-pay | Admitting: Urology

## 2020-05-13 VITALS — BP 159/76 | HR 94 | Ht 73.0 in | Wt 250.0 lb

## 2020-05-13 DIAGNOSIS — N3941 Urge incontinence: Secondary | ICD-10-CM

## 2020-05-13 DIAGNOSIS — N3281 Overactive bladder: Secondary | ICD-10-CM

## 2020-05-13 LAB — BLADDER SCAN AMB NON-IMAGING

## 2020-05-13 MED ORDER — OXYBUTYNIN CHLORIDE 5 MG PO TABS: 5.0000 mg | ORAL_TABLET | Freq: Every day | ORAL | 2 refills | Status: DC

## 2020-05-13 NOTE — Patient Instructions (Signed)

## 2020-05-13 NOTE — Progress Notes (Signed)
° °  05/13/2020 4:06 PM   Kirk Bennett. 1950-10-08 [id number]  Reason for visit: Follow up overactive bladder  HPI: I saw Kirk Bennett in urology clinic for follow-up of overactive bladder with primary symptoms of urgency and urge incontinence.  When I saw him originally in early August he was having a few months of moderate to severe urinary urgency, frequency, and urge incontinence that was very bothersome and requires depends.  He previously was tried on Flomax and Myrbetriq without any significant improvement.  Urinalysis and PVR were previously normal.  At our last visit, we trialed oxybutynin 10 mg XL.  He reports this is almost completely resolved his bothersome urinary symptoms, however he is having some side effect of blurred vision and dry mouth.  He is interested in other options.  We discussed decreasing the dose to 5 mg, or trying a different anticholinergic, and he opts for the decreased dose.  Risks and benefits were discussed at length.  We also discussed the need for cystoscopy in the future if he has worsening symptoms, hematuria, or dysuria to rule out any other bladder or urethral pathology.  Decrease oxybutynin to 5 mg XL daily 99-month virtual visit symptom check  Consider cystoscopy if worsening urinary symptoms   Kirk Co, MD  [place] [address], [place], [place] [address] [phone]

## 2020-06-18 ENCOUNTER — Ambulatory Visit: Payer: 59 | Admitting: Podiatry

## 2020-06-18 ENCOUNTER — Encounter: Payer: Self-pay | Admitting: Podiatry

## 2020-06-18 ENCOUNTER — Other Ambulatory Visit: Payer: Self-pay

## 2020-06-18 DIAGNOSIS — E1121 Type 2 diabetes mellitus with diabetic nephropathy: Secondary | ICD-10-CM | POA: Diagnosis not present

## 2020-06-18 DIAGNOSIS — E1140 Type 2 diabetes mellitus with diabetic neuropathy, unspecified: Secondary | ICD-10-CM | POA: Diagnosis not present

## 2020-06-18 DIAGNOSIS — B351 Tinea unguium: Secondary | ICD-10-CM | POA: Insufficient documentation

## 2020-06-18 DIAGNOSIS — M79675 Pain in left toe(s): Secondary | ICD-10-CM | POA: Diagnosis not present

## 2020-06-18 DIAGNOSIS — M79674 Pain in right toe(s): Secondary | ICD-10-CM

## 2020-06-18 NOTE — Progress Notes (Signed)
This patient returns to my office for at risk foot care.  This patient requires this care by a professional since this patient will be at risk due to having  CKD and type 2 diabetes.  This patient is unable to cut nails himself since the patient cannot reach his nails.These nails are painful walking and wearing shoes.  This patient presents for at risk foot care today.  General Appearance  Alert, conversant and in no acute stress.  Vascular  Dorsalis pedis  are palpable  bilaterally. Posterior tibial pulses are absent. Capillary return is within normal limits  bilaterally. Temperature is within normal limits  bilaterally.  Neurologic  Senn-Weinstein monofilament wire test within normal limits  bilaterally. Muscle power within normal limits bilaterally.  Nails Thick disfigured discolored nails with subungual debris  from hallux to fifth toes bilaterally. No evidence of bacterial infection or drainage bilaterally.  Orthopedic  No limitations of motion  feet .  No crepitus or effusions noted.  No bony pathology or digital deformities noted.  Painful right swollen ankle. No pain or increased temperature or ROM right ankle.  Skin  normotropic skin with no porokeratosis noted bilaterally.  No signs of infections or ulcers noted.     Onychomycosis  Pain in right toes  Pain in left toes  Consent was obtained for treatment procedures.   Mechanical debridement of nails 1-5  bilaterally performed with a nail nipper.  Filed with dremel without incident.    Return office visit   3 months.                   Told patient to return for periodic foot care and evaluation due to potential at risk complications. Told him to be evaluated by one of the doctors in the office.   Gardiner Barefoot DPM

## 2020-06-23 ENCOUNTER — Other Ambulatory Visit: Payer: Self-pay

## 2020-06-23 ENCOUNTER — Encounter: Payer: Self-pay | Admitting: Podiatry

## 2020-06-23 ENCOUNTER — Ambulatory Visit (INDEPENDENT_AMBULATORY_CARE_PROVIDER_SITE_OTHER): Payer: 59 | Admitting: Podiatry

## 2020-06-23 DIAGNOSIS — E1121 Type 2 diabetes mellitus with diabetic nephropathy: Secondary | ICD-10-CM | POA: Diagnosis not present

## 2020-06-23 DIAGNOSIS — M25471 Effusion, right ankle: Secondary | ICD-10-CM | POA: Diagnosis not present

## 2020-06-23 DIAGNOSIS — R601 Generalized edema: Secondary | ICD-10-CM | POA: Diagnosis not present

## 2020-06-23 NOTE — Progress Notes (Signed)
Subjective:  Patient ID: Kirk Bennett., male    DOB: 1951-01-20,  MRN: [id number]  Chief Complaint  Patient presents with  . Ankle Pain    Patient presents today for right lateral side ankle swelling, denies pain x 3 weeks.  He denies any injury.  He says "it feels tight and stiff and in the mornings the swelling is down"    69 y.o. male presents with the above complaint.  Patient presents with complaint of right ankle swelling without any pain.  Patient has been going for 3 weeks.  Patient states he is usually in a dependent position.  He states that his foot is always hanging down.  He states is as a poor habit of elevating.  He states he elevates some but not enough.  He does not wear his compression his socks as occasionally as he should.  He denies any other acute complaints.  He is known to Dr. Prudence Davidson for routine foot care.  He is a diabetic.   Review of Systems: Negative except as noted in the HPI. Denies N/V/F/Ch.  Past Medical History:  Diagnosis Date  . Cataracts, bilateral 02/2011   and suspected glaucoma, to return for f/u, no diabetic retinopathy  . CKD stage 3 due to type 2 diabetes mellitus ([place]) 2015   normal renal US, self referred to Dr Holley Raring  . 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 Mora Bellman at [facility])  . Depression    found by neuro  . Elevated PSA 05/27/2015   Serial monitoring ([place])   . Glaucoma 2015   suspect  . History of hepatitis A   . HLD (hyperlipidemia)   . HTN (hypertension)   . Knee pain    s/p replacement  . SVT (supraventricular tachycardia) ([place]) 1996  . Vitamin D deficiency 03/02/2015  . Well controlled type 2 diabetes mellitus with nephropathy ([place]) 1996   established with Dr. Gabriel Carina endo --> 02/2016 decided to return to PCP for DM care    Current Outpatient Medications:  .  atorvastatin (LIPITOR) 20 MG tablet, TAKE 1 TABLET BY MOUTH  EVERY DAY, Disp: 90 tablet, Rfl: 3 .  Cholecalciferol (VITAMIN D3) 1000 units CAPS, Take 1,000 Units by mouth daily., Disp: , Rfl:  .  clotrimazole (LOTRIMIN AF) 1 % cream, Apply 1 application topically 2 (two) times daily., Disp: 113 g, Rfl: 0 .  glimepiride (AMARYL) 4 MG tablet, Take 1 tablet (4 mg total) by mouth daily with breakfast., Disp: 90 tablet, Rfl: 1 .  glucose blood (ONE TOUCH ULTRA TEST) test strip, Use to check sugar fasting in the AM and 2 hours after lunch or supper. Dx: E11.21, Disp: 100 each, Rfl: 3 .  levETIRAcetam (KEPPRA) 500 MG tablet, Take by mouth., Disp: , Rfl:  .  lisinopril (ZESTRIL) 10 MG tablet, TAKE 1 TABLET BY MOUTH TWICE A DAY, Disp: 180 tablet, Rfl: 3 .  metFORMIN (GLUCOPHAGE) 1000 MG tablet, TAKE 1 TABLET BY MOUTH TWICE A DAY WITH MEALS, Disp: 180 tablet, Rfl: 1 .  oxybutynin (DITROPAN) 5 MG tablet, Take 1 tablet (5 mg total) by mouth daily., Disp: 30 tablet, Rfl: 2  Social History   Tobacco Use  Smoking Status Never Smoker  Smokeless Tobacco Never Used    No Known Allergies Objective:  There were no vitals filed for this visit. There is no height or weight on file to calculate BMI.  Constitutional Well developed. Well nourished.  Vascular Dorsalis pedis pulses palpable bilaterally. Posterior tibial pulses palpable bilaterally. Capillary refill normal to all digits.  No cyanosis or clubbing noted. Pedal hair growth normal.  Neurologic Normal speech. Oriented to person, place, and time. Epicritic sensation to light touch grossly present bilaterally.  Dermatologic  generalized circumferential ankle swelling noted with no pitting edema.  This is nonpitting swelling.  No pain on palpation to the ankle joint, posterior tibial tendon, peroneal tendon, Achilles tendon, ATFL ligament.  Orthopedic: Normal joint ROM without pain or crepitus bilaterally. No visible deformities. No bony tenderness.   Radiographs: None Assessment:   1. Right ankle swelling    2. Generalized edema   3. Type 2 diabetes mellitus with diabetic nephropathy, without long-term current use of insulin ([place])    Plan:  Patient was evaluated and treated and all questions answered.  Right generalized ankle swelling -I explained to the patient the etiology of ankle swelling and various treatment options were discussed.  I believe patient will benefit from compression socks and I discussed the importance of it with the patient in extensive detail.  Patient states understanding and he will start wearing them more often.  Also discussed with the patient the importance of elevation as well.  I also discussed with the patient the importance of compression sleeve.  Compression sleeve for the ankle was dispensed to the patient.  Given that there is no clinical signs of pain at this time I will hold off on any kind of injection.  Patient agrees with the plan  No follow-ups on file.

## 2020-07-06 ENCOUNTER — Other Ambulatory Visit: Payer: Self-pay | Admitting: Neurology

## 2020-07-06 DIAGNOSIS — G959 Disease of spinal cord, unspecified: Secondary | ICD-10-CM

## 2020-07-08 ENCOUNTER — Ambulatory Visit (INDEPENDENT_AMBULATORY_CARE_PROVIDER_SITE_OTHER)
Admission: RE | Admit: 2020-07-08 | Discharge: 2020-07-08 | Disposition: A | Discharge status: Home / Self Care | Payer: 59 | Source: Ambulatory Visit | Attending: Family Medicine | Admitting: Family Medicine

## 2020-07-08 ENCOUNTER — Other Ambulatory Visit: Payer: Self-pay

## 2020-07-08 ENCOUNTER — Encounter: Payer: Self-pay | Admitting: Family Medicine

## 2020-07-08 ENCOUNTER — Ambulatory Visit: Payer: 59 | Admitting: Family Medicine

## 2020-07-08 VITALS — BP 130/76 | HR 103 | Temp 97.7°F | Ht 74.0 in | Wt 248.2 lb

## 2020-07-08 DIAGNOSIS — W19XXXA Unspecified fall, initial encounter: Secondary | ICD-10-CM

## 2020-07-08 DIAGNOSIS — M1711 Unilateral primary osteoarthritis, right knee: Secondary | ICD-10-CM

## 2020-07-08 DIAGNOSIS — M25561 Pain in right knee: Secondary | ICD-10-CM

## 2020-07-08 NOTE — Progress Notes (Signed)
Kirk Bennett T. Kirk Paget, MD, [place] Kirk [place], [address]  Phone: [phone]  FAX: [place]. - 69 y.o. male  MRN [id number]  Date of Birth: Jul 17, 1951  Date: 07/08/2020  PCP: Ria Bush, MD  Referral: Ria Bush, MD  Chief Complaint  Patient presents with  . Knee Pain    Right  . Fall    Last Wednesday    This visit occurred during the SARS-CoV-2 public health emergency.  Safety protocols were in place, including screening questions prior to the visit, additional usage of staff PPE, and extensive cleaning of exam room while observing appropriate contact time as indicated for disinfecting solutions.   Subjective:   Kirk Bennett. is a 69 y.o. very pleasant male patient with Body mass index is 31.87 kg/m. who presents with the following:  [place] last Wed:  R knee is having some significant pain acutely.  He does have a mild effusion.  He has no significant known prior knee injury that he can recall.  He is able to ambulate with minimal difficulty.  He does have some lateral knee pain.  He does not have any pain in the calf.  He did fall and strike his anterior knee.  He has no tenderness at the tibial tuberosity.  At this point, it is still significantly tender.  He does have a history of a distant left total knee arthroplasty done by Dr. Noel Journey at [facility]. He has no known history of other traumatic injury of the knees.  Review of Systems is noted in the HPI, as appropriate   Objective:   BP 130/76   Pulse (!) 103   Temp 97.7 F (36.5 C) (Temporal)   Ht 6\' 2"  (1.88 m)   Wt 248 lb 4 oz (112.6 kg)   SpO2 96%   BMI 31.87 kg/m   Right knee: Does have mild effusion.  Patellar and quad tendons are intact with an intact extensor as well as flexor mechanism with 5/5 strength  He does have some mild medial and lateral joint  line tenderness.  Stable to varus and valgus stress.  Lachman as well as anterior and posterior drawer testing are negative.  He has full extension.  Flexion to 105 degrees.  He has no tenderness at the tibial tuberosity.  No tenderness at the tibial plateau.  No tenderness along the proximal tibia or the fibula or the fibular head.  He has no pain with palpation of the patella itself.  Radiology: DG Knee Complete 4 Views Right  Result Date: 07/09/2020 CLINICAL DATA:  Right knee pain after fall. EXAM: RIGHT KNEE - COMPLETE 4+ VIEW COMPARISON:  None. FINDINGS: No evidence of dislocation or joint effusion. There appears to be fracture of the tibial tuberosity which most likely is old, but if patient is point tender in this area, further evaluation with CT or MRI is recommended. Chondrocalcinosis is noted medially and laterally suggesting early degenerative joint disease or possibly calcium pyrophosphate deposition disease mild patellar spurring is noted. Soft tissues are unremarkable. IMPRESSION: There appears to be fracture of the tibial tuberosity which most likely is old, but if patient is point tender in this area, further evaluation with CT or MRI is recommended. Mild degenerative changes are noted as described above. Electronically Signed   By: Marijo Conception M.D.   On: 07/09/2020 10:14  Assessment and Plan:     ICD-10-CM   1. Acute pain of right knee  M25.561 DG Knee Complete 4 Views Right  2. Fall, initial encounter  W19.XXXA DG Knee Complete 4 Views Right  3. Primary osteoarthritis of right knee  M17.11    Acute right knee pain in the setting of traumatic fall.  Internal derangement or meniscal tear is also certainly possible, but with some loss of motion from his effusion, I would like to reevaluate this in a few weeks.  If his symptoms persist an additional evaluation is certainly appropriate.  He does not have pain in the tibial tuberosity.  At the very least, the patient is  good to have a significant bone contusion plus or minus meniscal contusion.  Social currently he has very limited ability exercise or with generalized movement.  No orders of the defined types were placed in this encounter.  There are no discontinued medications. Orders Placed This Encounter  Procedures  . DG Knee Complete 4 Views Right    Follow-up: Return for 3-4 weeks with Dr. Loletha Grayer to recheck knee.  Signed,  Maud Deed. Kalisa Girtman, MD   Outpatient Encounter Medications as of 07/08/2020  Medication Sig  . atorvastatin (LIPITOR) 20 MG tablet TAKE 1 TABLET BY MOUTH EVERY DAY  . Cholecalciferol (VITAMIN D3) 1000 units CAPS Take 1,000 Units by mouth daily.  . clotrimazole (LOTRIMIN AF) 1 % cream Apply 1 application topically 2 (two) times daily.  Marland Kitchen glimepiride (AMARYL) 4 MG tablet Take 1 tablet (4 mg total) by mouth daily with breakfast.  . glucose blood (ONE TOUCH ULTRA TEST) test strip Use to check sugar fasting in the AM and 2 hours after lunch or supper. Dx: E11.21  . levETIRAcetam (KEPPRA) 500 MG tablet Take by mouth.  Marland Kitchen lisinopril (ZESTRIL) 10 MG tablet TAKE 1 TABLET BY MOUTH TWICE A DAY  . metFORMIN (GLUCOPHAGE) 1000 MG tablet TAKE 1 TABLET BY MOUTH TWICE A DAY WITH MEALS  . oxybutynin (DITROPAN) 5 MG tablet Take 1 tablet (5 mg total) by mouth daily.   No facility-administered encounter medications on file as of 07/08/2020.

## 2020-07-20 ENCOUNTER — Ambulatory Visit
Admission: RE | Admit: 2020-07-20 | Discharge: 2020-07-20 | Disposition: A | Discharge status: Home / Self Care | Payer: 59 | Source: Ambulatory Visit | Attending: Neurology | Admitting: Neurology

## 2020-07-20 ENCOUNTER — Other Ambulatory Visit: Payer: Self-pay

## 2020-07-20 DIAGNOSIS — G959 Disease of spinal cord, unspecified: Secondary | ICD-10-CM

## 2020-07-22 ENCOUNTER — Other Ambulatory Visit: Payer: Self-pay | Admitting: Family Medicine

## 2020-07-22 NOTE — Telephone Encounter (Signed)
E-scribed refill.  Plz schedule cpe and lab visits.  

## 2020-08-11 ENCOUNTER — Other Ambulatory Visit: Payer: Self-pay

## 2020-08-11 ENCOUNTER — Ambulatory Visit: Payer: 59 | Attending: Nephrology | Admitting: Physical Therapy

## 2020-08-11 ENCOUNTER — Encounter: Payer: Self-pay | Admitting: Physical Therapy

## 2020-08-11 DIAGNOSIS — R293 Abnormal posture: Secondary | ICD-10-CM | POA: Diagnosis present

## 2020-08-11 DIAGNOSIS — R262 Difficulty in walking, not elsewhere classified: Secondary | ICD-10-CM | POA: Diagnosis present

## 2020-08-11 DIAGNOSIS — Z9181 History of falling: Secondary | ICD-10-CM | POA: Diagnosis present

## 2020-08-11 NOTE — Therapy (Signed)
[facility] [address] [place], [place], [address] Phone: [phone]   Fax:  [phone]  Physical Therapy Evaluation  Patient Details  Name: Ansen Ocasio. MRN: [id number] Date of Birth: 1950/10/07 Referring Provider (PT): Holley Raring, [place]   Encounter Date: 08/11/2020   PT End of Session - 08/11/20 1511    Visit Number 1    Number of Visits 13    Date for PT Re-Evaluation 09/22/20    PT Start Time 0900    PT Stop Time 0955    PT Time Calculation (min) 55 min    Equipment Utilized During Treatment Gait belt    Activity Tolerance Patient tolerated treatment well    Behavior During Therapy WFL for tasks assessed/performed           Past Medical History:  Diagnosis Date  . Cataracts, bilateral 02/2011   and suspected glaucoma, to return for f/u, no diabetic retinopathy  . CKD stage 3 due to type 2 diabetes mellitus ([place]) 2015   normal renal US, self referred to Dr Holley Raring  . 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 Mora Bellman at [facility])  . Depression    found by neuro  . Elevated PSA 05/27/2015   Serial monitoring ([place])   . Glaucoma 2015   suspect  . History of hepatitis A   . HLD (hyperlipidemia)   . HTN (hypertension)   . Knee pain    s/p replacement  . SVT (supraventricular tachycardia) ([place]) 1996  . Vitamin D deficiency 03/02/2015  . 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 Gabriel Carina endo --> 02/2016 decided to return to PCP for DM care    Past Surgical History:  Procedure Laterality Date  . CARDIAC CATHETERIZATION  July 2010   No blockages (Dr. Liliane Shi)  . COLONOSCOPY  09/16/2005   hyperplastic polyps, rpt due 10 yrs   . COLONOSCOPY WITH PROPOFOL N/A 12/11/2015   mult polyps, few TA, rpt 36yrs ([place])  . corrective surgery amblyopia  1957  .  Cystectomy or meningioma removal brain  1990   (unclear)  . Left knee surgery  1971   Torn ACL  . REPLACEMENT TOTAL KNEE  April 2011   Left [facility] Dr. Garald Balding)    There were no vitals filed for this visit.    Subjective Assessment - 08/11/20 0909    Subjective Patient presents to clinic with complaints of weakness and deconditioning. Patient also notes that he has LBP which is relieved when bent forward or sitting. 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 Patient has had 1 fall which occurred when in socks and bending forward. His feet started to slide and he was unable to correct and recover balance resulting in a fall. Patient notes that he has not been regularly physically active. Patient notes that his difficulties with walking and balance are coming from his LBP. Patient also adds that when his back is flared up if he stops and sits, his pain resolves with no carry over of pain after the activity ceases.    Pertinent History Patient familiar to clinic as he was seen previously for BLE weakness. Patient achieved goals with last course of PT and was able  to self-manage. He notes he has maintained his level of strength and is still able to stand from couch in family room/den without complaint.    Limitations Lifting;Standing;Walking;House hold activities    How long can you sit comfortably? unlimited    How long can you stand comfortably? 30 min (9/10 pain)    How long can you walk comfortably? 30 min (9/10 pain)    Patient Stated Goals "work the back out"    Currently in Pain? No/denies              [facility] PT Assessment - 08/11/20 0901      Assessment   Medical Diagnosis Generalized weakness    Referring Provider (PT) Holley Raring, [place]    Prior Therapy yes      Balance Screen   Has the patient fallen in the past 6 months  Yes    How many times? 1    Has the patient had a decrease in activity level because of a fear of falling?  Yes           Red flags (bowel/bladder changes, saddle paresthesia, personal history of cancer, chills/fever, night sweats, unrelenting pain) Negative  OBJECTIVE MUSCULOSKELETAL: Tremor: Absent Bulk: Normal Tone: Normal, no clonus noted  Posture Sitting: no gross abnormalities; patient preferences "prop sitting" with anterior pelvic tilt Standing: hip and lumbar flexion considerable with patient unable to achieve stacked posture in standing without external support.   Gait Patient ambulates with limited B foot clearance and stride length, negligible arm swing (R>L). Trunk and hips remain flexed throughout gait cycle. Patient demonstrates festinating gait with pace changes (moderate frequency), surface changes (moderate frequency), and turns (occasional frequency). When ambulating with SPC, patient has mildly improved stride length and foot clearance but still struggles with festination at pace changes and surface changes.  Strength R/L 5/5 Hip flexion 5/5 Hip abduction (seated) 5/5 Hip adduction (seated) 5/5 Knee extension 5/5 Knee flexion 5/5 Ankle Plantarflexion 5/5 Ankle Dorsiflexion   NEUROLOGICAL: Mental Status Patient is oriented to person, place and time.  Recent memory is intact.  Remote memory is intact.  Attention span and concentration are intact.  Expressive speech is intact.  Patient's fund of knowledge is within normal limits for educational level.  Cranial Nerves Visual acuity and visual fields are intact  Extraocular muscles are intact  Facial sensation is intact bilaterally  Facial strength is intact bilaterally  Hearing is normal as tested by gross conversation Palate elevates midline, normal phonation  Shoulder shrug strength is intact  Tongue protrudes midline   Sensation Grossly intact to light touch bilateral UEs/LEs as determined by  testing dermatomes C2-T2/L2-S2 respectively Proprioception and hot/cold testing deferred on this date  Reflexes R/L Absent Knee Jerk (L3/4) Absent Ankle Jerk (S1/2)  Coordination/Cerebellar Finger to Nose: WNL Heel to Shin: WNL Rapid alternating movements: some difficulty coordinating with UE, no difficulty with LE Finger Opposition: WNL Pronator Drift: negative  FUNCTIONAL OUTCOME MEASURES   Results Comments  5TSTS 18.5 seconds From seating surface 23.5 in from floor; indicative of fall risk  10 Meter Gait Speed Self-selected: 0.15m/s with SPC; Below normative values for full community ambulation    ASSESSMENT Patient is a 69 year old presenting to clinic with chief complaints of difficulty with walking and lumbar pain. Upon examination, patient demonstrates deficits in gait, posture, pain, and falls risk as evidenced by limited B foot clearance and stride length with and without SPC, festinating gait with pace/surface/direction changes, limited arm swing with gait (R>L),  significantly flexed trunk and hips in standing posture, 0.39 m/s gait speed, and 9/10 back pain with ~30 min of standing/walking (per patient report). Patient's responses on FOTO outcome measures (54) indicate significant functional limitations/disability/distress. Patient's progress may be limited due to insidious onset and unclear etiology for gait changes; however, patient's past success in PT is advantageous. Considering patient's festinating gait, significantly limited arm swing during gait, and complaint of tongue fasciculations (noted by neurology) in the context of longstanding use of glimiperide for DM management as well as previous use of seizure medications, patient merits close monitoring for other symptoms indicative of extrapyramidal effects. Patient will benefit from continued skilled therapeutic intervention to address deficits in gait, posture, pain, and falls risk in order to increase function and improve  overall QOL.      Objective measurements completed on examination: See above findings.        PT Long Term Goals - 08/11/20 1550      PT LONG TERM GOAL #1   Title Patient will be independent with HEP in order to improve strength and balance in order to decrease fall risk and improve function at home and work.    Baseline IE: none provided at this time    Time 6    Period Weeks    Status New    Target Date 09/22/20      PT LONG TERM GOAL #2   Title Patient will improve DGI by at least 3 points in order to demonstrate clinically significant improvement in balance and decreased risk for falls.    Baseline IE: to be assessed at next session    Time 6    Period Weeks    Status New    Target Date 09/22/20      PT LONG TERM GOAL #3   Title Patient will decrease 5TSTS by at least 3 seconds without UE support and from 17-18" seat height in order to demonstrate clinically significant improvement in LE strength..    Baseline IE: 18.5 sec from 23.5" seat    Time 6    Period Weeks    Status New    Target Date 09/22/20      PT LONG TERM GOAL #4   Title Patient will increase 10 meter walk test to >1.74m/s as to improve gait speed for better community ambulation and to reduce fall risk.    Baseline IE: 0.41m/s, SPC (self-selected)    Time 6    Period Weeks    Status New    Target Date 09/22/20      PT LONG TERM GOAL #5   Title Patient will demonstrate improved function as evidenced by a score of 65 on FOTO measure for full participation in activities at home and in the community.    Baseline IE: 74    Time 6    Period Weeks    Status New    Target Date 09/22/20                  Plan - 08/11/20 1511    Clinical Impression Statement Patient is a 69 year old presenting to clinic with chief complaints of difficulty with walking and lumbar pain. Upon examination, patient demonstrates deficits in gait, posture, pain, and falls risk as evidenced by limited B foot clearance  and stride length with and without SPC, festinating gait with pace/surface/direction changes, limited arm swing with gait (R>L), significantly flexed trunk and hips in standing posture, 0.39 m/s gait speed, and 9/10 back  pain with ~30 min of standing/walking (per patient report). Patient's responses on FOTO outcome measures (54) indicate significant functional limitations/disability/distress. Patient's progress may be limited due to insidious onset and unclear etiology for gait changes; however, patient's past success in PT is advantageous. Considering patient's festinating gait, significantly limited arm swing during gait, and complaint of tongue fasciculations (noted by neurology) in the context of longstanding use of glimiperide for DM management as well as previous use of seizure medications, patient merits close monitoring for other symptoms indicative of extrapyramidal effects. Patient will benefit from continued skilled therapeutic intervention to address deficits in gait, posture, pain, and falls risk in order to increase function and improve overall QOL.    Personal Factors and Comorbidities Comorbidity 3+;Fitness;Time since onset of injury/illness/exacerbation;Past/Current Experience    Comorbidities HTN, DM, HLD,    Examination-Activity Limitations Lift;Bend;Squat;Transfers;Stand;Stairs;Carry;Reach Overhead;Locomotion Level    Examination-Participation Restrictions Interpersonal Relationship;Yard Work;Laundry;Cleaning;Community Activity;Shop    Stability/Clinical Decision Making Evolving/Moderate complexity    Clinical Decision Making Moderate    Rehab Potential Fair    PT Frequency 2x / week    PT Duration 6 weeks    PT Treatment/Interventions ADLs/Self Care Home Management;Moist Heat;Electrical Stimulation;Cryotherapy;DME Instruction;Therapeutic activities;Gait training;Stair training;Therapeutic exercise;Balance training;Neuromuscular re-education;Functional mobility training;Energy  conservation;Manual techniques;Patient/family education;Orthotic Fit/Training    PT Next Visit Plan DGI, hip extension, lumbar manual, stride length    PT Home Exercise Plan none initiated at this time    Consulted and Agree with Plan of Care Patient           Patient will benefit from skilled therapeutic intervention in order to improve the following deficits and impairments:  Abnormal gait,Decreased balance,Pain,Postural dysfunction,Decreased coordination,Decreased activity tolerance,Improper body mechanics,Difficulty walking,Decreased mobility,Decreased endurance  Visit Diagnosis: Difficulty in walking, not elsewhere classified  Personal history of fall  Abnormal posture     Problem List Patient Active Problem List   Diagnosis Date Noted  . Pain due to onychomycosis of toenails of both feet 06/18/2020  . Rash of foot 01/30/2020  . Stage 3 chronic kidney disease (HCC) 10/23/2019  . Type 2 diabetes mellitus with diabetic neuropathy, unspecified (HCC) 10/21/2019  . Impaired gait and mobility 10/14/2019  . Generalized weakness   . Postural dizziness with presyncope 10/13/2019  . Ventricular premature complexes 10/13/2019  . Urge urinary incontinence 09/26/2019  . Depressed mood 09/26/2019  . Advanced care planning/counseling discussion 09/22/2018  . Weakness of both lower extremities 09/22/2018  . History of hepatitis 01/05/2018  . 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 NAFLD (nonalcoholic fatty liver disease) 16/10/960402/04/2018  . Obesity, Class I, BMI 30.0-34.9 (see actual BMI) 09/18/2017  . Polyarthralgia 02/15/2017  . OSA (obstructive sleep apnea) 02/15/2017  . Partial epilepsy with impairment of consciousness (HCC) 01/29/2016  . Low vitamin B12 level 05/27/2015  . Elevated PSA 05/27/2015  . Vitamin D deficiency 03/02/2015  . Other long term (current) drug therapy 03/12/2012  . Health maintenance examination 04/26/2011  . TOTAL KNEE REPLACEMENT, LEFT, HX OF  04/07/2010  . Type 2 diabetes mellitus with diabetic nephropathy (HCC) 03/30/2010  . Hyperlipidemia associated with type 2 diabetes mellitus (HCC) 03/30/2010  . 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 Essential hypertension, benign 03/30/2010   Sheria LangKatlin Marriah Sanderlin PT, DPT [phone]  08/11/2020, 3:52 PM  [facility] [address] [place], [place], [address] Phone: [phone]   Fax:  [phone]  Name: Dessie Comaichard A Coudriet Jr.  MRN: [id number] Date of Birth: 1950-11-18

## 2020-08-12 ENCOUNTER — Telehealth: Payer: 59 | Admitting: Urology

## 2020-08-12 ENCOUNTER — Other Ambulatory Visit: Payer: Self-pay | Admitting: Urology

## 2020-08-12 DIAGNOSIS — N3941 Urge incontinence: Secondary | ICD-10-CM

## 2020-08-12 DIAGNOSIS — N3281 Overactive bladder: Secondary | ICD-10-CM

## 2020-08-13 ENCOUNTER — Ambulatory Visit: Payer: 59 | Admitting: Physical Therapy

## 2020-08-13 ENCOUNTER — Other Ambulatory Visit: Payer: Self-pay

## 2020-08-13 ENCOUNTER — Encounter: Payer: Self-pay | Admitting: Physical Therapy

## 2020-08-13 DIAGNOSIS — Z9181 History of falling: Secondary | ICD-10-CM

## 2020-08-13 DIAGNOSIS — R262 Difficulty in walking, not elsewhere classified: Secondary | ICD-10-CM

## 2020-08-13 DIAGNOSIS — R293 Abnormal posture: Secondary | ICD-10-CM

## 2020-08-13 NOTE — Therapy (Signed)
[facility] [address] [place], [place], [address] Phone: [phone]   Fax:  [phone]  Physical Therapy Treatment  Patient Details  Name: Kirk Bennett. MRN: [id number] Date of Birth: 12-Sep-1950 Referring Provider (PT): Holley Raring, [place]   Encounter Date: 08/13/2020   PT End of Session - 08/13/20 1057    Visit Number 2    Number of Visits 13    Date for PT Re-Evaluation 09/22/20    PT Start Time 1055    PT Stop Time 1135    PT Time Calculation (min) 40 min    Equipment Utilized During Treatment Gait belt    Activity Tolerance Patient tolerated treatment well    Behavior During Therapy WFL for tasks assessed/performed           Past Medical History:  Diagnosis Date  . Cataracts, bilateral 02/2011   and suspected glaucoma, to return for f/u, no diabetic retinopathy  . CKD stage 3 due to type 2 diabetes mellitus ([place]) 2015   normal renal US, self referred to Dr Holley Raring  . 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 Mora Bellman at [facility])  . Depression    found by neuro  . Elevated PSA 05/27/2015   Serial monitoring ([place])   . Glaucoma 2015   suspect  . History of hepatitis A   . HLD (hyperlipidemia)   . HTN (hypertension)   . Knee pain    s/p replacement  . SVT (supraventricular tachycardia) ([place]) 1996  . Vitamin D deficiency 03/02/2015  . 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 Gabriel Carina endo --> 02/2016 decided to return to PCP for DM care    Past Surgical History:  Procedure Laterality Date  . CARDIAC CATHETERIZATION  July 2010   No blockages (Dr. Liliane Shi)  . COLONOSCOPY  09/16/2005   hyperplastic polyps, rpt due 10 yrs   . COLONOSCOPY WITH PROPOFOL N/A 12/11/2015   mult polyps, few TA, rpt 36yrs ([place])  . corrective surgery amblyopia  1957  .  Cystectomy or meningioma removal brain  1990   (unclear)  . Left knee surgery  1971   Torn ACL  . REPLACEMENT TOTAL KNEE  April 2011   Left [facility] Dr. Garald Balding)    There were no vitals filed for this visit.   Subjective Assessment - 08/13/20 1055    Subjective Patient denies any significant changes since last visit. Denies any falls.    Pertinent History Patient familiar to clinic as he was seen previously for BLE weakness. Patient achieved goals with last course of PT and was able to self-manage. He notes he has maintained his level of strength and is still able to stand from couch in family room/den without complaint.    Limitations Lifting;Standing;Walking;House hold activities    How long can you sit comfortably? unlimited    How long can you stand comfortably? 30 min (9/10 pain)    How long can you walk comfortably? 30 min (9/10 pain)    Patient Stated Goals "work the back out"    Currently in Pain? No/denies           TREATMENT Therapeutic Exercise: NuStep, L3-5, seat 15, x10 min, SPM target 70-80 for warm-up and leg strengthening  Neuromuscular Re-education: DGI: 12 (see flowsheet) Gait in hallway with large amplitude strides for improved gait cycle //  bars: Hurdle walk (6") x4 laps, SBA, BUE support, large amplitude strides encouraged for improved fluidity of movement Cone walk (cones for visual cue on stride length),  x4 laps, SBA, BUE support, large amplitude strides encouraged for improved fluidity of movement Change of surface gait (airex pad stepping stones), x4 laps, SBA, BUE support, large amplitude strides encouraged for improved fluidity of movement Standing balance with postural challenge: nautilus bar shoulder flexion, chest press, trunk rotation, x6 each side for balance challenge 1/2 foam roll, A/P taps, x20, BUE support, SBA 1/2 foam roll, static balance, x45 sec, BUE support, SBA  Patient educated throughout session on appropriate technique and form using  multi-modal cueing, HEP, and activity modification.   Patient Response to interventions: Patient reporting fatigue.  ASSESSMENT Patient presents to clinic to participate in therapy with SPC. Patient demonstrates deficits in gait, posture, pain, and falls risk. Patient with improved fluidity of movement with encouragement of large amplitude during today's session and responded positively to all active interventions. Patient will benefit from continued skilled therapeutic intervention to address remaining deficits in gait, posture, pain, and falls risk in order to increase function and improve overall QOL.     PT Long Term Goals - 08/11/20 1550      PT LONG TERM GOAL #1   Title Patient will be independent with HEP in order to improve strength and balance in order to decrease fall risk and improve function at home and work.    Baseline IE: none provided at this time    Time 6    Period Weeks    Status New    Target Date 09/22/20      PT LONG TERM GOAL #2   Title Patient will improve DGI by at least 3 points in order to demonstrate clinically significant improvement in balance and decreased risk for falls.    Baseline IE: to be assessed at next session    Time 6    Period Weeks    Status New    Target Date 09/22/20      PT LONG TERM GOAL #3   Title Patient will decrease 5TSTS by at least 3 seconds without UE support and from 17-18" seat height in order to demonstrate clinically significant improvement in LE strength..    Baseline IE: 18.5 sec from 23.5" seat    Time 6    Period Weeks    Status New    Target Date 09/22/20      PT LONG TERM GOAL #4   Title Patient will increase 10 meter walk test to >1.69m/s as to improve gait speed for better community ambulation and to reduce fall risk.    Baseline IE: 0.11m/s, SPC (self-selected)    Time 6    Period Weeks    Status New    Target Date 09/22/20      PT LONG TERM GOAL #5   Title Patient will demonstrate improved function as  evidenced by a score of 65 on FOTO measure for full participation in activities at home and in the community.    Baseline IE: 53    Time 6    Period Weeks    Status New    Target Date 09/22/20                 Plan - 08/13/20 1057    Clinical Impression Statement Patient presents to clinic to participate in therapy with SPC. Patient demonstrates deficits in gait, posture, pain, and falls risk. Patient with  improved fluidity of movement with encouragement of large amplitude during today's session and responded positively to all active interventions. Patient will benefit from continued skilled therapeutic intervention to address remaining deficits in gait, posture, pain, and falls risk in order to increase function and improve overall QOL.    Personal Factors and Comorbidities Comorbidity 3+;Fitness;Time since onset of injury/illness/exacerbation;Past/Current Experience    Comorbidities HTN, DM, HLD,    Examination-Activity Limitations Lift;Bend;Squat;Transfers;Stand;Stairs;Carry;Reach Overhead;Locomotion Level    Examination-Participation Restrictions Interpersonal Relationship;Yard Work;Laundry;Cleaning;Community Activity;Shop    Stability/Clinical Decision Making Evolving/Moderate complexity    Rehab Potential Fair    PT Frequency 2x / week    PT Duration 6 weeks    PT Treatment/Interventions ADLs/Self Care Home Management;Moist Heat;Electrical Stimulation;Cryotherapy;DME Instruction;Therapeutic activities;Gait training;Stair training;Therapeutic exercise;Balance training;Neuromuscular re-education;Functional mobility training;Energy conservation;Manual techniques;Patient/family education;Orthotic Fit/Training    PT Next Visit Plan DGI, hip extension, lumbar manual, stride length    PT Home Exercise Plan none initiated at this time    Consulted and Agree with Plan of Care Patient           Patient will benefit from skilled therapeutic intervention in order to improve the following  deficits and impairments:  Abnormal gait,Decreased balance,Pain,Postural dysfunction,Decreased coordination,Decreased activity tolerance,Improper body mechanics,Difficulty walking,Decreased mobility,Decreased endurance  Visit Diagnosis: Difficulty in walking, not elsewhere classified  Personal history of fall  Abnormal posture     Problem List Patient Active Problem List   Diagnosis Date Noted  . Pain due to onychomycosis of toenails of both feet 06/18/2020  . Rash of foot 01/30/2020  . Stage 3 chronic kidney disease ([place]) 10/23/2019  . Type 2 diabetes mellitus with diabetic neuropathy, unspecified ([place]) 10/21/2019  . Impaired gait and mobility 10/14/2019  . Generalized weakness   . Postural dizziness with presyncope 10/13/2019  . Ventricular premature complexes 10/13/2019  . Urge urinary incontinence 09/26/2019  . Depressed mood 09/26/2019  . Advanced care planning/counseling discussion 09/22/2018  . Weakness of both lower extremities 09/22/2018  . History of hepatitis 01/05/2018  . 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 NAFLD (nonalcoholic fatty liver disease) 09/30/2017  . Obesity, Class I, BMI 30.0-34.9 (see actual BMI) 09/18/2017  . Polyarthralgia 02/15/2017  . OSA (obstructive sleep apnea) 02/15/2017  . Partial epilepsy with impairment of consciousness ([place]) 01/29/2016  . Low vitamin B12 level 05/27/2015  . Elevated PSA 05/27/2015  . Vitamin D deficiency 03/02/2015  . Other long term (current) drug therapy 03/12/2012  . Health maintenance examination 04/26/2011  . TOTAL KNEE REPLACEMENT, LEFT, HX OF 04/07/2010  . Type 2 diabetes mellitus with diabetic nephropathy ([place]) 03/30/2010  . Hyperlipidemia associated with type 2 diabetes mellitus ([place]) 03/30/2010  . 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 Essential hypertension, benign 03/30/2010   Myles Gip PT, DPT [phone]  08/13/2020, 12:53 PM  [facility] [address] [place], [place], [address] Phone: [phone]   Fax:  [phone]  Name: Kirk Bennett. MRN: [id number] Date of Birth: April 05, 1951

## 2020-08-18 ENCOUNTER — Other Ambulatory Visit: Payer: Self-pay

## 2020-08-18 ENCOUNTER — Encounter: Payer: Self-pay | Admitting: Physical Therapy

## 2020-08-18 ENCOUNTER — Ambulatory Visit: Payer: 59 | Admitting: Physical Therapy

## 2020-08-18 DIAGNOSIS — R262 Difficulty in walking, not elsewhere classified: Secondary | ICD-10-CM

## 2020-08-18 DIAGNOSIS — Z9181 History of falling: Secondary | ICD-10-CM

## 2020-08-18 DIAGNOSIS — R293 Abnormal posture: Secondary | ICD-10-CM

## 2020-08-18 NOTE — Therapy (Signed)
[facility] [address] [place], [place], [address] Phone: [phone]   Fax:  [phone]  Physical Therapy Treatment  Patient Details  Name: Kirk Bennett. MRN: [id number] Date of Birth: [birth year] Referring Provider (PT): Holley Raring, [place]   Encounter Date: 08/18/2020   PT End of Session - 08/18/20 1010    Visit Number 3    Number of Visits 13    Date for PT Re-Evaluation 09/22/20    PT Start Time 1005    PT Stop Time 1050    PT Time Calculation (min) 45 min    Equipment Utilized During Treatment Gait belt    Activity Tolerance Patient tolerated treatment well;Patient limited by fatigue    Behavior During Therapy WFL for tasks assessed/performed           Past Medical History:  Diagnosis Date   Cataracts, bilateral 02/2011   and suspected glaucoma, to return for f/u, no diabetic retinopathy   CKD stage 3 due to type 2 diabetes mellitus ([place]) 2015   normal renal US, self referred to Dr Holley Raring   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 Gabriel Carina endo --> 02/2016 decided to return to PCP for DM care    Past Surgical History:  Procedure Laterality Date   CARDIAC CATHETERIZATION  July 2010   No blockages (Dr. Liliane Shi)   COLONOSCOPY  09/16/2005   hyperplastic polyps, rpt due 10 yrs    COLONOSCOPY WITH PROPOFOL N/A 12/11/2015   mult polyps, few TA, rpt 45yrs ([place])   corrective  surgery amblyopia  1957   Cystectomy or meningioma removal brain  1990   (unclear)   Left knee surgery  1971   Torn ACL   REPLACEMENT TOTAL KNEE  April 2011   Left [facility] Dr. Garald Balding)    There were no vitals filed for this visit.   Subjective Assessment - 08/18/20 1009    Subjective Patient reports he had a nice holiday and time in [place] with family. Denies any falls since last session.    Pertinent History Patient familiar to clinic as he was seen previously for BLE weakness. Patient achieved goals with last course of PT and was able to self-manage. He notes he has maintained his level of strength and is still able to stand from couch in family room/den without complaint.    Limitations Lifting;Standing;Walking;House hold activities    How long can you sit comfortably? unlimited    How long can you stand comfortably? 30 min (9/10 pain)    How long can you walk comfortably? 30 min (9/10 pain)    Patient Stated Goals "work the back out"    Currently in Pain? No/denies             TREATMENT Therapeutic Exercise: NuStep, L3-5, seat 15, x6 min, SPM target 70-80 for warm-up and leg strengthening Seated postural strengthening: scapular row,  OH press, 3x12  Neuromuscular Re-education: Gait in hallway with large amplitude strides for improved gait cycle // bars: Hurdle walk (4") x6 laps, SBA, BUE support, large amplitude strides encouraged for improved fluidity of movement Hurdle walk (4-6") x6 laps, SBA, BUE support, large amplitude strides encouraged for improved fluidity of movement Airex static balance, 30 sec each, NBOS, tandem, SBA/CGA, SUE support Airex tandem gait, x3 laps, BUE support, SBA/CGA, BUE support   Patient educated throughout session on appropriate technique and form using multi-modal cueing, HEP, and activity modification.   Patient Response to interventions: Patient states "I feel terrible" and when asked for clarification notes high level of  fatigue.  ASSESSMENT Patient presents to clinic to participate in therapy. Patient demonstrates deficits in gait, posture, pain, and falls risk. Patient continuing to apply large amplitude movement with adequate consistency when provided with UE support during today's session and responded positively to all active interventions. Patient will benefit from continued skilled therapeutic intervention to address remaining deficits in gait, posture, pain, and falls risk in order to increase function and improve overall QOL.     PT Long Term Goals - 08/11/20 1550      PT LONG TERM GOAL #1   Title Patient will be independent with HEP in order to improve strength and balance in order to decrease fall risk and improve function at home and work.    Baseline IE: none provided at this time    Time 6    Period Weeks    Status New    Target Date 09/22/20      PT LONG TERM GOAL #2   Title Patient will improve DGI by at least 3 points in order to demonstrate clinically significant improvement in balance and decreased risk for falls.    Baseline IE: to be assessed at next session    Time 6    Period Weeks    Status New    Target Date 09/22/20      PT LONG TERM GOAL #3   Title Patient will decrease 5TSTS by at least 3 seconds without UE support and from 17-18" seat height in order to demonstrate clinically significant improvement in LE strength..    Baseline IE: 18.5 sec from 23.5" seat    Time 6    Period Weeks    Status New    Target Date 09/22/20      PT LONG TERM GOAL #4   Title Patient will increase 10 meter walk test to >1.32m/s as to improve gait speed for better community ambulation and to reduce fall risk.    Baseline IE: 0.35m/s, SPC (self-selected)    Time 6    Period Weeks    Status New    Target Date 09/22/20      PT LONG TERM GOAL #5   Title Patient will demonstrate improved function as evidenced by a score of 65 on FOTO measure for full participation in activities at home and in  the community.    Baseline IE: 54    Time 6    Period Weeks    Status New    Target Date 09/22/20                 Plan - 08/18/20 1010    Clinical Impression Statement Patient presents to clinic to participate in therapy. Patient demonstrates deficits in gait, posture, pain, and falls risk. Patient continuing to apply large amplitude movement with adequate consistency when provided with UE support during today's  session and responded positively to all active interventions. Patient will benefit from continued skilled therapeutic intervention to address remaining deficits in gait, posture, pain, and falls risk in order to increase function and improve overall QOL.    Personal Factors and Comorbidities Comorbidity 3+;Fitness;Time since onset of injury/illness/exacerbation;Past/Current Experience    Comorbidities HTN, DM, HLD,    Examination-Activity Limitations Lift;Bend;Squat;Transfers;Stand;Stairs;Carry;Reach Overhead;Locomotion Level    Examination-Participation Restrictions Interpersonal Relationship;Yard Work;Laundry;Cleaning;Community Activity;Shop    Stability/Clinical Decision Making Evolving/Moderate complexity    Rehab Potential Fair    PT Frequency 2x / week    PT Duration 6 weeks    PT Treatment/Interventions ADLs/Self Care Home Management;Moist Heat;Electrical Stimulation;Cryotherapy;DME Instruction;Therapeutic activities;Gait training;Stair training;Therapeutic exercise;Balance training;Neuromuscular re-education;Functional mobility training;Energy conservation;Manual techniques;Patient/family education;Orthotic Fit/Training    PT Next Visit Plan DGI, hip extension, lumbar manual, stride length    PT Home Exercise Plan none initiated at this time    Consulted and Agree with Plan of Care Patient           Patient will benefit from skilled therapeutic intervention in order to improve the following deficits and impairments:  Abnormal gait,Decreased balance,Pain,Postural  dysfunction,Decreased coordination,Decreased activity tolerance,Improper body mechanics,Difficulty walking,Decreased mobility,Decreased endurance  Visit Diagnosis: Difficulty in walking, not elsewhere classified  Personal history of fall  Abnormal posture     Problem List Patient Active Problem List   Diagnosis Date Noted   Pain due to onychomycosis of toenails of both feet 06/18/2020   Rash of foot 01/30/2020   Stage 3 chronic kidney disease ([place]) 10/23/2019   Type 2 diabetes mellitus with diabetic neuropathy, unspecified ([place]) 10/21/2019   Impaired gait and mobility 10/14/2019   Generalized weakness    Postural dizziness with presyncope 10/13/2019   Ventricular premature complexes 10/13/2019   Urge urinary incontinence 09/26/2019   Depressed mood 09/26/2019   Advanced care planning/counseling discussion 09/22/2018   Weakness of both lower extremities 09/22/2018   History of hepatitis 01/05/2018   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   NAFLD (nonalcoholic fatty liver disease) 09/30/2017   Obesity, Class I, BMI 30.0-34.9 (see actual BMI) 09/18/2017   Polyarthralgia 02/15/2017   OSA (obstructive sleep apnea) 02/15/2017   Partial epilepsy with impairment of consciousness ([place]) 01/29/2016   Low vitamin B12 level 05/27/2015   Elevated PSA 05/27/2015   Vitamin D deficiency 03/02/2015   Other long term (current) drug therapy 03/12/2012   Health maintenance examination 04/26/2011   TOTAL KNEE REPLACEMENT, LEFT, HX OF 04/07/2010   Type 2 diabetes mellitus with diabetic nephropathy ([place]) 03/30/2010   Hyperlipidemia associated with type 2 diabetes mellitus ([place]) 03/30/2010   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 [place], [place], [address] Phone: [phone]   Fax:  [phone]  Name: Kirk Bennett. MRN: [id number] Date of Birth: August 31, 1950

## 2020-08-20 ENCOUNTER — Ambulatory Visit: Payer: 59 | Admitting: Physical Therapy

## 2020-08-20 ENCOUNTER — Encounter: Payer: Self-pay | Admitting: Physical Therapy

## 2020-08-20 ENCOUNTER — Other Ambulatory Visit: Payer: Self-pay

## 2020-08-20 DIAGNOSIS — R262 Difficulty in walking, not elsewhere classified: Secondary | ICD-10-CM

## 2020-08-20 DIAGNOSIS — R293 Abnormal posture: Secondary | ICD-10-CM

## 2020-08-20 DIAGNOSIS — Z9181 History of falling: Secondary | ICD-10-CM

## 2020-08-20 NOTE — Therapy (Signed)
[facility] [address] [place], [place], [address] Phone: [phone]   Fax:  [phone]  Physical Therapy Treatment  Patient Details  Name: Kirk Bennett. MRN: [id number] Date of Birth: 12/16/1950 Referring Provider (PT): Holley Raring, [place]   Encounter Date: 08/20/2020   PT End of Session - 08/20/20 1224    Visit Number 4    Number of Visits 13    Date for PT Re-Evaluation 09/22/20    PT Start Time 1000    PT Stop Time [id number]    PT Time Calculation (min) 48 min    Equipment Utilized During Treatment Gait belt    Activity Tolerance Patient tolerated treatment well;Patient limited by fatigue    Behavior During Therapy WFL for tasks assessed/performed           Past Medical History:  Diagnosis Date   Cataracts, bilateral 02/2011   and suspected glaucoma, to return for f/u, no diabetic retinopathy   CKD stage 3 due to type 2 diabetes mellitus ([place]) 2015   normal renal US, self referred to Dr Holley Raring   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 Gabriel Carina endo --> 02/2016 decided to return to PCP for DM care    Past Surgical History:  Procedure Laterality Date   CARDIAC CATHETERIZATION  July 2010   No blockages (Dr. Liliane Shi)   COLONOSCOPY  09/16/2005   hyperplastic polyps, rpt due 10 yrs    COLONOSCOPY WITH PROPOFOL N/A 12/11/2015   mult polyps, few TA, rpt 10yrs ([place])   corrective  surgery amblyopia  1957   Cystectomy or meningioma removal brain  1990   (unclear)   Left knee surgery  1971   Torn ACL   REPLACEMENT TOTAL KNEE  April 2011   Left [facility] Dr. Garald Balding)    There were no vitals filed for this visit.   Subjective Assessment - 08/20/20 1223    Subjective Patient reports some incresaed R knee pain; he notes this may be from how he slept. Patient denies any other concerns at this time.    Pertinent History Patient familiar to clinic as he was seen previously for BLE weakness. Patient achieved goals with last course of PT and was able to self-manage. He notes he has maintained his level of strength and is still able to stand from couch in family room/den without complaint.    Limitations Lifting;Standing;Walking;House hold activities    How long can you sit comfortably? unlimited    How long can you stand comfortably? 30 min (9/10 pain)    How long can you walk comfortably? 30 min (9/10 pain)    Patient Stated Goals "work the back out"    Currently in Pain? Yes    Pain Score 2     Pain Location Knee    Pain Orientation Right  TREATMENT Manual Therapy: R knee patellar mobilizations, grade II/III, all directions, for improved pain and joint mobility R knee PA tib fem mobilizations, grade III, for improved pain and joint mobility STM/TPR of R distal quad for improved pain and joint mobility  Neuromuscular Re-education: NuStep, L1-3, seat 15, x5 min, SPM target 70-80 for pain modulation and improved knee mobility Gait in hallway with large amplitude strides for improved gait cycle // bars: Arm swing in standing, x20, for improved fluidity and coordination of movement Hip flexion gait with reciprocal contact for improved coordination of movement Large amplitude heel strike gait with step reduction over a given distance Large amplitude retropulsion for improved hip extension Large amplitude UE/LE gait for improved coordination of  movement  Patient educated throughout session on appropriate technique and form using multi-modal cueing, HEP, and activity modification.   Patient Response to interventions: Patient reports improved R knee pain.   ASSESSMENT Patient presents to clinic to participate in therapy. Patient demonstrates deficits in gait, posture, pain, and falls risk. Patient had >90% accuracy with large amplitude reciprocal gait at reduced speed and with min-mod VCs during today's session and responded positively to all active interventions. Patient will benefit from continued skilled therapeutic intervention to address remaining deficits in gait, posture, pain, and falls risk in order to increase function and improve overall QOL.     PT Long Term Goals - 08/11/20 1550      PT LONG TERM GOAL #1   Title Patient will be independent with HEP in order to improve strength and balance in order to decrease fall risk and improve function at home and work.    Baseline IE: none provided at this time    Time 6    Period Weeks    Status New    Target Date 09/22/20      PT LONG TERM GOAL #2   Title Patient will improve DGI by at least 3 points in order to demonstrate clinically significant improvement in balance and decreased risk for falls.    Baseline IE: to be assessed at next session    Time 6    Period Weeks    Status New    Target Date 09/22/20      PT LONG TERM GOAL #3   Title Patient will decrease 5TSTS by at least 3 seconds without UE support and from 17-18" seat height in order to demonstrate clinically significant improvement in LE strength..    Baseline IE: 18.5 sec from 23.5" seat    Time 6    Period Weeks    Status New    Target Date 09/22/20      PT LONG TERM GOAL #4   Title Patient will increase 10 meter walk test to >1.70m/s as to improve gait speed for better community ambulation and to reduce fall risk.    Baseline IE: 0.22m/s, SPC (self-selected)    Time 6    Period Weeks    Status New     Target Date 09/22/20      PT LONG TERM GOAL #5   Title Patient will demonstrate improved function as evidenced by a score of 65 on FOTO measure for full participation in activities at home and in the community.    Baseline IE: 74    Time 6    Period Weeks    Status New    Target Date 09/22/20                 Plan -  08/20/20 1225    Clinical Impression Statement Patient presents to clinic to participate in therapy. Patient demonstrates deficits in gait, posture, pain, and falls risk. Patient had >90% accuracy with large amplitude reciprocal gait at reduced speed and with min-mod VCs during today's session and responded positively to all active interventions. Patient will benefit from continued skilled therapeutic intervention to address remaining deficits in gait, posture, pain, and falls risk in order to increase function and improve overall QOL.    Personal Factors and Comorbidities Comorbidity 3+;Fitness;Time since onset of injury/illness/exacerbation;Past/Current Experience    Comorbidities HTN, DM, HLD,    Examination-Activity Limitations Lift;Bend;Squat;Transfers;Stand;Stairs;Carry;Reach Overhead;Locomotion Level    Examination-Participation Restrictions Interpersonal Relationship;Yard Work;Laundry;Cleaning;Community Activity;Shop    Stability/Clinical Decision Making Evolving/Moderate complexity    Rehab Potential Fair    PT Frequency 2x / week    PT Duration 6 weeks    PT Treatment/Interventions ADLs/Self Care Home Management;Moist Heat;Electrical Stimulation;Cryotherapy;DME Instruction;Therapeutic activities;Gait training;Stair training;Therapeutic exercise;Balance training;Neuromuscular re-education;Functional mobility training;Energy conservation;Manual techniques;Patient/family education;Orthotic Fit/Training    PT Next Visit Plan large amplitude training    PT Home Exercise Plan none initiated at this time    Consulted and Agree with Plan of Care Patient            Patient will benefit from skilled therapeutic intervention in order to improve the following deficits and impairments:  Abnormal gait,Decreased balance,Pain,Postural dysfunction,Decreased coordination,Decreased activity tolerance,Improper body mechanics,Difficulty walking,Decreased mobility,Decreased endurance  Visit Diagnosis: Difficulty in walking, not elsewhere classified  Personal history of fall  Abnormal posture     Problem List Patient Active Problem List   Diagnosis Date Noted   Pain due to onychomycosis of toenails of both feet 06/18/2020   Rash of foot 01/30/2020   Stage 3 chronic kidney disease (HCC) 10/23/2019   Type 2 diabetes mellitus with diabetic neuropathy, unspecified (HCC) 10/21/2019   Impaired gait and mobility 10/14/2019   Generalized weakness    Postural dizziness with presyncope 10/13/2019   Ventricular premature complexes 10/13/2019   Urge urinary incontinence 09/26/2019   Depressed mood 09/26/2019   Advanced care planning/counseling discussion 09/22/2018   Weakness of both lower extremities 09/22/2018   History of hepatitis 01/05/2018   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   NAFLD (nonalcoholic fatty liver disease) 12/45/8099   Obesity, Class I, BMI 30.0-34.9 (see actual BMI) 09/18/2017   Polyarthralgia 02/15/2017   OSA (obstructive sleep apnea) 02/15/2017   Partial epilepsy with impairment of consciousness (HCC) 01/29/2016   Low vitamin B12 level 05/27/2015   Elevated PSA 05/27/2015   Vitamin D deficiency 03/02/2015   Other long term (current) drug therapy 03/12/2012   Health maintenance examination 04/26/2011   TOTAL KNEE REPLACEMENT, LEFT, HX OF 04/07/2010   Type 2 diabetes mellitus with diabetic nephropathy (HCC) 03/30/2010   Hyperlipidemia associated with type 2 diabetes mellitus (HCC) 03/30/2010   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. [place], [place], [address] Phone: [phone]   Fax:  [phone]  Name: Kirk Bennett. MRN: [id number] Date of Birth: 1950/09/17

## 2020-08-25 ENCOUNTER — Ambulatory Visit: Payer: 59 | Attending: Nephrology | Admitting: Physical Therapy

## 2020-08-25 ENCOUNTER — Encounter: Payer: Self-pay | Admitting: Physical Therapy

## 2020-08-25 ENCOUNTER — Other Ambulatory Visit: Payer: Self-pay

## 2020-08-25 DIAGNOSIS — R262 Difficulty in walking, not elsewhere classified: Secondary | ICD-10-CM | POA: Diagnosis present

## 2020-08-25 DIAGNOSIS — Z9181 History of falling: Secondary | ICD-10-CM | POA: Diagnosis present

## 2020-08-25 DIAGNOSIS — R293 Abnormal posture: Secondary | ICD-10-CM | POA: Diagnosis present

## 2020-08-25 NOTE — Therapy (Signed)
[facility] [address] [place], [place], [address] Phone: [phone]   Fax:  [phone]  Physical Therapy Treatment  Patient Details  Name: Kirk Bennett. MRN: [id number] Date of Birth: [birth year] Referring Provider (PT): Holley Raring, [place]   Encounter Date: 08/25/2020   PT End of Session - 08/25/20 1253    Visit Number 5    Number of Visits 13    Date for PT Re-Evaluation 09/22/20    PT Start Time 1000    PT Stop Time 1047    PT Time Calculation (min) 47 min    Equipment Utilized During Treatment Gait belt    Activity Tolerance Patient tolerated treatment well;Patient limited by fatigue    Behavior During Therapy WFL for tasks assessed/performed           Past Medical History:  Diagnosis Date  . Cataracts, bilateral 02/2011   and suspected glaucoma, to return for f/u, no diabetic retinopathy  . CKD stage 3 due to type 2 diabetes mellitus ([place]) 2015   normal renal US, self referred to Dr Holley Raring  . 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 Mora Bellman at [facility])  . Depression    found by neuro  . Elevated PSA 05/27/2015   Serial monitoring ([place])   . Glaucoma 2015   suspect  . History of hepatitis A   . HLD (hyperlipidemia)   . HTN (hypertension)   . Knee pain    s/p replacement  . SVT (supraventricular tachycardia) ([place]) 1996  . Vitamin D deficiency 03/02/2015  . 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 Gabriel Carina endo --> 02/2016 decided to return to PCP for DM care    Past Surgical History:  Procedure Laterality Date  . CARDIAC CATHETERIZATION  July 2010   No blockages (Dr. Liliane Shi)  . COLONOSCOPY  09/16/2005   hyperplastic polyps, rpt due 10 yrs   . COLONOSCOPY WITH PROPOFOL N/A 12/11/2015   mult polyps, few TA, rpt 57yrs ([place])  . corrective surgery  amblyopia  1957  . Cystectomy or meningioma removal brain  1990   (unclear)  . Left knee surgery  1971   Torn ACL  . REPLACEMENT TOTAL KNEE  April 2011   Left [facility] Dr. Garald Balding)    There were no vitals filed for this visit.   Subjective Assessment - 08/25/20 1241    Subjective Patient denies any concerns since last session. Notes that he was fatigued (mentally and physically) after last session, but is eager to participate in today's session.    Pertinent History Patient familiar to clinic as he was seen previously for BLE weakness. Patient achieved goals with last course of PT and was able to self-manage. He notes he has maintained his level of strength and is still able to stand from couch in family room/den without complaint.    Limitations Lifting;Standing;Walking;House hold activities    How long can you sit comfortably? unlimited    How long can you stand comfortably? 30 min (9/10 pain)    How long can you walk comfortably? 30 min (9/10 pain)    Patient Stated Goals "work the back out"           TREATMENT Therapeutic Exercise: NuStep, L1-3, seat 15, x8 min, SPM target 70-80 for improved activity tolerance STS, BUE support, standard height chair 5x 1  Neuromuscular Re-education: Gait in hallway with large amplitude strides for improved gait cycle // bars: Arm swing in standing, x20, for improved fluidity and coordination of movement Large amplitude heel strike gait with step reduction over a given distance Large amplitude UE/LE gait for improved coordination of movement "Stepping stone" large amplitude gait using airex pads for increased stride length, BUE faded to SUE support, SBA Static balance: firm surface- NBOS, tandem R, tandem L with motor dual task (vertical ball toss x20 each) Static balance: airex pad- normal BOS with motor dual task (vertical ball toss 2x10)  Patient educated throughout session on appropriate technique and form using multi-modal cueing, HEP,  and activity modification.   Patient Response to interventions: Patient reports improved R knee pain.   ASSESSMENT Patient presents to clinic to participate in therapy. Patient demonstrates deficits in gait, posture, pain, and falls risk. Patient able to reduce step count over a given distance by half when cued for increased stride length during today's session and responded positively to all active interventions. Patient will benefit from continued skilled therapeutic intervention to address remaining deficits in gait, posture, pain, and falls risk in order to increase function and improve overall QOL.      PT Long Term Goals - 08/11/20 1550      PT LONG TERM GOAL #1   Title Patient will be independent with HEP in order to improve strength and balance in order to decrease fall risk and improve function at home and work.    Baseline IE: none provided at this time    Time 6    Period Weeks    Status New    Target Date 09/22/20      PT LONG TERM GOAL #2   Title Patient will improve DGI by at least 3 points in order to demonstrate clinically significant improvement in balance and decreased risk for falls.    Baseline IE: to be assessed at next session    Time 6    Period Weeks    Status New    Target Date 09/22/20      PT LONG TERM GOAL #3   Title Patient will decrease 5TSTS by at least 3 seconds without UE support and from 17-18" seat height in order to demonstrate clinically significant improvement in LE strength..    Baseline IE: 18.5 sec from 23.5" seat    Time 6    Period Weeks    Status New    Target Date 09/22/20      PT LONG TERM GOAL #4   Title Patient will increase 10 meter walk test to >1.62m/s as to improve gait speed for better community ambulation and to reduce fall risk.    Baseline IE: 0.24m/s, SPC (self-selected)    Time 6    Period Weeks    Status New    Target Date 09/22/20      PT LONG TERM GOAL #5   Title Patient will demonstrate improved function as  evidenced by a score of 65 on FOTO measure for full participation in activities at home and in the community.    Baseline IE: 54    Time 6    Period Weeks    Status New    Target Date 09/22/20                 Plan - 08/25/20 1253    Clinical Impression Statement Patient presents to clinic to participate in therapy. Patient demonstrates deficits in gait, posture, pain, and  falls risk. Patient able to reduce step count over a given distance by half when cued for increased stride length during today's session and responded positively to all active interventions. Patient will benefit from continued skilled therapeutic intervention to address remaining deficits in gait, posture, pain, and falls risk in order to increase function and improve overall QOL.    Personal Factors and Comorbidities Comorbidity 3+;Fitness;Time since onset of injury/illness/exacerbation;Past/Current Experience    Comorbidities HTN, DM, HLD,    Examination-Activity Limitations Lift;Bend;Squat;Transfers;Stand;Stairs;Carry;Reach Overhead;Locomotion Level    Examination-Participation Restrictions Interpersonal Relationship;Yard Work;Laundry;Cleaning;Community Activity;Shop    Stability/Clinical Decision Making Evolving/Moderate complexity    Rehab Potential Fair    PT Frequency 2x / week    PT Duration 6 weeks    PT Treatment/Interventions ADLs/Self Care Home Management;Moist Heat;Electrical Stimulation;Cryotherapy;DME Instruction;Therapeutic activities;Gait training;Stair training;Therapeutic exercise;Balance training;Neuromuscular re-education;Functional mobility training;Energy conservation;Manual techniques;Patient/family education;Orthotic Fit/Training    PT Next Visit Plan large amplitude training    PT Home Exercise Plan none initiated at this time    Consulted and Agree with Plan of Care Patient           Patient will benefit from skilled therapeutic intervention in order to improve the following deficits and  impairments:  Abnormal gait,Decreased balance,Pain,Postural dysfunction,Decreased coordination,Decreased activity tolerance,Improper body mechanics,Difficulty walking,Decreased mobility,Decreased endurance  Visit Diagnosis: Difficulty in walking, not elsewhere classified  Personal history of fall  Abnormal posture     Problem List Patient Active Problem List   Diagnosis Date Noted  . Pain due to onychomycosis of toenails of both feet 06/18/2020  . Rash of foot 01/30/2020  . Stage 3 chronic kidney disease ([place]) 10/23/2019  . Type 2 diabetes mellitus with diabetic neuropathy, unspecified ([place]) 10/21/2019  . Impaired gait and mobility 10/14/2019  . Generalized weakness   . Postural dizziness with presyncope 10/13/2019  . Ventricular premature complexes 10/13/2019  . Urge urinary incontinence 09/26/2019  . Depressed mood 09/26/2019  . Advanced care planning/counseling discussion 09/22/2018  . Weakness of both lower extremities 09/22/2018  . History of hepatitis 01/05/2018  . 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 NAFLD (nonalcoholic fatty liver disease) 09/30/2017  . Obesity, Class I, BMI 30.0-34.9 (see actual BMI) 09/18/2017  . Polyarthralgia 02/15/2017  . OSA (obstructive sleep apnea) 02/15/2017  . Partial epilepsy with impairment of consciousness ([place]) 01/29/2016  . Low vitamin B12 level 05/27/2015  . Elevated PSA 05/27/2015  . Vitamin D deficiency 03/02/2015  . Other long term (current) drug therapy 03/12/2012  . Health maintenance examination 04/26/2011  . TOTAL KNEE REPLACEMENT, LEFT, HX OF 04/07/2010  . Type 2 diabetes mellitus with diabetic nephropathy ([place]) 03/30/2010  . Hyperlipidemia associated with type 2 diabetes mellitus ([place]) 03/30/2010  . 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) 03/30/2010  . Essential hypertension, benign 03/30/2010   Myles Gip PT, DPT [id number]  08/25/2020, 1:02 PM  [facility] [address] [place], [place], [address] Phone: [phone]   Fax:  [phone]  Name: Kirk Bennett. MRN: [id number] Date of Birth: [birth year]

## 2020-08-27 ENCOUNTER — Other Ambulatory Visit: Payer: Self-pay

## 2020-08-27 ENCOUNTER — Ambulatory Visit: Payer: Self-pay | Admitting: Urology

## 2020-08-27 ENCOUNTER — Encounter: Payer: Self-pay | Admitting: Physical Therapy

## 2020-08-27 ENCOUNTER — Ambulatory Visit: Payer: 59 | Admitting: Physical Therapy

## 2020-08-27 DIAGNOSIS — Z9181 History of falling: Secondary | ICD-10-CM

## 2020-08-27 DIAGNOSIS — R293 Abnormal posture: Secondary | ICD-10-CM

## 2020-08-27 DIAGNOSIS — R262 Difficulty in walking, not elsewhere classified: Secondary | ICD-10-CM | POA: Diagnosis not present

## 2020-08-27 NOTE — Therapy (Signed)
[facility] [place] [address] [place], [place], [address] Phone: [phone]   Fax:  [phone]  Physical Therapy Treatment  Patient Details  Name: Kirk Bennett. MRN: [id number] Date of Birth: [birth year] Referring Provider (PT): Cherylann Ratel, [place]   Encounter Date: 08/27/2020   PT End of Session - 08/27/20 1249    Visit Number 6    Number of Visits 13    Date for PT Re-Evaluation 09/22/20    PT Start Time 0955    PT Stop Time 1045    PT Time Calculation (min) 50 min    Equipment Utilized During Treatment Gait belt    Activity Tolerance Patient tolerated treatment well;Patient limited by fatigue    Behavior During Therapy WFL for tasks assessed/performed           Past Medical History:  Diagnosis Date  . Cataracts, bilateral 02/2011   and suspected glaucoma, to return for f/u, no diabetic retinopathy  . CKD stage 3 due to type 2 diabetes mellitus (HCC) 2015   normal renal US, self referred to Dr Cherylann Ratel  . Complex partial seizures (HCC) 1990   from surgery for R temporal arachnoid cyst s/p drainage, possible continued sz so changed to lamotrigine ([facility] Keene Breath [facility])  . Depression    found by neuro  . Elevated PSA 05/27/2015   Serial monitoring ([place])   . Glaucoma 2015   suspect  . History of hepatitis A   . HLD (hyperlipidemia)   . HTN (hypertension)   . Knee pain    s/p replacement  . SVT (supraventricular tachycardia) (HCC) 1996  . Vitamin D deficiency 03/02/2015  . 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 Tedd Sias endo --> 02/2016 decided to return to PCP for DM care    Past Surgical History:  Procedure Laterality Date  . CARDIAC CATHETERIZATION  July 2010   No blockages (Dr. Christene Slates)  . COLONOSCOPY  09/16/2005   hyperplastic polyps, rpt due 10 yrs   . COLONOSCOPY WITH PROPOFOL N/A 12/11/2015   mult polyps, few TA, rpt 71yrs ([place])  . corrective surgery  amblyopia  1957  . Cystectomy or meningioma removal brain  1990   (unclear)  . Left knee surgery  1971   Torn ACL  . REPLACEMENT TOTAL KNEE  April 2011   Left [facility] Dr. Tobie Poet)    There were no vitals filed for this visit.   Subjective Assessment - 08/27/20 1248    Subjective Patient reports that he was fatigued after last session and did not feel fully recovered until this morning. Patient denies any concerns or changes since last visit.    Pertinent History Patient familiar to clinic as he was seen previously for BLE weakness. Patient achieved goals with last course of PT and was able to self-manage. He notes he has maintained his level of strength and is still able to stand from couch in family room/den without complaint.    Limitations Lifting;Standing;Walking;House hold activities    How long can you sit comfortably? unlimited    How long can you stand comfortably? 30 min (9/10 pain)    How long can you walk comfortably? 30 min (9/10 pain)    Patient Stated Goals "work the back out"    Currently in Pain? No/denies           TREATMENT Therapeutic Exercise: NuStep, L1-3, seat 15, x8 min, SPM target 70-80 for improved activity tolerance  STS, 26" seat surface, 2x5, 25" seat surface 2x5, no BUE support  Neuromuscular Re-education: Gait in hallway with large amplitude strides for improved gait cycle // bars: Large amplitude heel strike gait with step reduction over a given distance with visual cues Large amplitude UE/LE gait for improved coordination of movement "Stepping stone" large amplitude gait using airex pads for increased stride length, BUE faded to SUE support, SBA Static balance: airex pad- normal BOS with motor dual task (vertical ball toss 2x10), modified tandem 15 sec holdx3 each  Obstacle negotiation with increased stride length (4" obstacles), faded UE support, SBA/CGA  Patient educated throughout session on appropriate technique and form using multi-modal  cueing, HEP, and activity modification.   Patient Response to interventions: Patient reports comfort with incorporating counter balance and STS at home.  ASSESSMENT Patient presents to clinic to participate in therapy. Patient demonstrates deficits in gait, posture, pain, and falls risk. Patient with improved reciprocal arm movement with gait absent of cueing during today's session and responded positively to all active interventions. Patient will benefit from continued skilled therapeutic intervention to address remaining deficits in gait, posture, pain, and falls risk in order to increase function and improve overall QOL.    PT Long Term Goals - 08/11/20 1550      PT LONG TERM GOAL #1   Title Patient will be independent with HEP in order to improve strength and balance in order to decrease fall risk and improve function at home and work.    Baseline IE: none provided at this time    Time 6    Period Weeks    Status New    Target Date 09/22/20      PT LONG TERM GOAL #2   Title Patient will improve DGI by at least 3 points in order to demonstrate clinically significant improvement in balance and decreased risk for falls.    Baseline IE: to be assessed at next session    Time 6    Period Weeks    Status New    Target Date 09/22/20      PT LONG TERM GOAL #3   Title Patient will decrease 5TSTS by at least 3 seconds without UE support and from 17-18" seat height in order to demonstrate clinically significant improvement in LE strength..    Baseline IE: 18.5 sec from 23.5" seat    Time 6    Period Weeks    Status New    Target Date 09/22/20      PT LONG TERM GOAL #4   Title Patient will increase 10 meter walk test to >1.76m/s as to improve gait speed for better community ambulation and to reduce fall risk.    Baseline IE: 0.20m/s, SPC (self-selected)    Time 6    Period Weeks    Status New    Target Date 09/22/20      PT LONG TERM GOAL #5   Title Patient will demonstrate  improved function as evidenced by a score of 65 on FOTO measure for full participation in activities at home and in the community.    Baseline IE: 33    Time 6    Period Weeks    Status New    Target Date 09/22/20                 Plan - 08/27/20 1249    Clinical Impression Statement Patient presents to clinic to participate in therapy. Patient demonstrates deficits in gait, posture, pain, and  falls risk. Patient with improved reciprocal arm movement with gait absent of cueing during today's session and responded positively to all active interventions. Patient will benefit from continued skilled therapeutic intervention to address remaining deficits in gait, posture, pain, and falls risk in order to increase function and improve overall QOL.    Personal Factors and Comorbidities Comorbidity 3+;Fitness;Time since onset of injury/illness/exacerbation;Past/Current Experience    Comorbidities HTN, DM, HLD,    Examination-Activity Limitations Lift;Bend;Squat;Transfers;Stand;Stairs;Carry;Reach Overhead;Locomotion Level    Examination-Participation Restrictions Interpersonal Relationship;Yard Work;Laundry;Cleaning;Community Activity;Shop    Stability/Clinical Decision Making Evolving/Moderate complexity    Rehab Potential Fair    PT Frequency 2x / week    PT Duration 6 weeks    PT Treatment/Interventions ADLs/Self Care Home Management;Moist Heat;Electrical Stimulation;Cryotherapy;DME Instruction;Therapeutic activities;Gait training;Stair training;Therapeutic exercise;Balance training;Neuromuscular re-education;Functional mobility training;Energy conservation;Manual techniques;Patient/family education;Orthotic Fit/Training    PT Next Visit Plan large amplitude training    PT Home Exercise Plan none initiated at this time    Consulted and Agree with Plan of Care Patient           Patient will benefit from skilled therapeutic intervention in order to improve the following deficits and  impairments:  Abnormal gait,Decreased balance,Pain,Postural dysfunction,Decreased coordination,Decreased activity tolerance,Improper body mechanics,Difficulty walking,Decreased mobility,Decreased endurance  Visit Diagnosis: Difficulty in walking, not elsewhere classified  Personal history of fall  Abnormal posture     Problem List Patient Active Problem List   Diagnosis Date Noted  . Pain due to onychomycosis of toenails of both feet 06/18/2020  . Rash of foot 01/30/2020  . Stage 3 chronic kidney disease ([place]) 10/23/2019  . Type 2 diabetes mellitus with diabetic neuropathy, unspecified ([place]) 10/21/2019  . Impaired gait and mobility 10/14/2019  . Generalized weakness   . Postural dizziness with presyncope 10/13/2019  . Ventricular premature complexes 10/13/2019  . Urge urinary incontinence 09/26/2019  . Depressed mood 09/26/2019  . Advanced care planning/counseling discussion 09/22/2018  . Weakness of both lower extremities 09/22/2018  . History of hepatitis 01/05/2018  . 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 NAFLD (nonalcoholic fatty liver disease) 09/30/2017  . Obesity, Class I, BMI 30.0-34.9 (see actual BMI) 09/18/2017  . Polyarthralgia 02/15/2017  . OSA (obstructive sleep apnea) 02/15/2017  . Partial epilepsy with impairment of consciousness ([place]) 01/29/2016  . Low vitamin B12 level 05/27/2015  . Elevated PSA 05/27/2015  . Vitamin D deficiency 03/02/2015  . Other long term (current) drug therapy 03/12/2012  . Health maintenance examination 04/26/2011  . TOTAL KNEE REPLACEMENT, LEFT, HX OF 04/07/2010  . Type 2 diabetes mellitus with diabetic nephropathy ([place]) 03/30/2010  . Hyperlipidemia associated with type 2 diabetes mellitus ([place]) 03/30/2010  . 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 Essential hypertension, benign 03/30/2010   Myles Gip PT, DPT [id number]  08/27/2020, 1:06 PM  [facility] [address] [place], [place], [address] Phone: [phone]   Fax:  [phone]  Name: [name] MRN: [id number] Date of Birth: November 22, 1950

## 2020-08-31 ENCOUNTER — Ambulatory Visit: Payer: 59 | Admitting: Physical Therapy

## 2020-08-31 ENCOUNTER — Other Ambulatory Visit: Payer: Self-pay

## 2020-08-31 ENCOUNTER — Encounter: Payer: Self-pay | Admitting: Physical Therapy

## 2020-08-31 DIAGNOSIS — Z9181 History of falling: Secondary | ICD-10-CM

## 2020-08-31 DIAGNOSIS — R262 Difficulty in walking, not elsewhere classified: Secondary | ICD-10-CM

## 2020-08-31 DIAGNOSIS — R293 Abnormal posture: Secondary | ICD-10-CM

## 2020-08-31 NOTE — Therapy (Signed)
[facility] [address]. [place], [place], [address] Phone: [phone]   Fax:  [phone]  Physical Therapy Treatment  Patient Details  Name: Kirk Bennett. MRN: [id number] Date of Birth: 08/13/1951 Referring Provider (PT): Holley Raring, [place]   Encounter Date: 08/31/2020   PT End of Session - 08/31/20 0947    Visit Number 7    Number of Visits 13    Date for PT Re-Evaluation 09/22/20    PT Start Time 1000    PT Stop Time 1055    PT Time Calculation (min) 55 min    Equipment Utilized During Treatment Gait belt    Activity Tolerance Patient tolerated treatment well;Patient limited by fatigue    Behavior During Therapy WFL for tasks assessed/performed           Past Medical History:  Diagnosis Date  . Cataracts, bilateral 02/2011   and suspected glaucoma, to return for f/u, no diabetic retinopathy  . CKD stage 3 due to type 2 diabetes mellitus ([place]) 2015   normal renal US, self referred to Dr Holley Raring  . 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 Mora Bellman at [facility])  . Depression    found by neuro  . Elevated PSA 05/27/2015   Serial monitoring ([place])   . Glaucoma 2015   suspect  . History of hepatitis A   . HLD (hyperlipidemia)   . HTN (hypertension)   . Knee pain    s/p replacement  . SVT (supraventricular tachycardia) ([place]) 1996  . Vitamin D deficiency 03/02/2015  . 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 Gabriel Carina endo --> 02/2016 decided to return to PCP for DM care    Past Surgical History:  Procedure Laterality Date  . CARDIAC CATHETERIZATION  July 2010   No blockages (Dr. Liliane Shi)  . COLONOSCOPY  09/16/2005   hyperplastic polyps, rpt due 10 yrs   . COLONOSCOPY WITH PROPOFOL N/A 12/11/2015   mult polyps, few TA, rpt 69yrs ([place])  . corrective surgery  amblyopia  1957  . Cystectomy or meningioma removal brain  1990   (unclear)  . Left knee surgery  1971   Torn ACL  . REPLACEMENT TOTAL KNEE  April 2011   Left [facility] Dr. Garald Balding)    There were no vitals filed for this visit.   Subjective Assessment - 08/31/20 0953    Subjective Patient reports an uneventful weekend. He denies any concerns at this time.    Pertinent History Patient familiar to clinic as he was seen previously for BLE weakness. Patient achieved goals with last course of PT and was able to self-manage. He notes he has maintained his level of strength and is still able to stand from couch in family room/den without complaint.    Limitations Lifting;Standing;Walking;House hold activities    How long can you sit comfortably? unlimited    How long can you stand comfortably? 30 min (9/10 pain)    How long can you walk comfortably? 30 min (9/10 pain)    Patient Stated Goals "work the back out"    Currently in Pain? No/denies           TREATMENT Therapeutic Exercise: NuStep, L1, seat 15, x10 min, SPM target 70-80 for improved activity tolerance Chest press 3x10 for improved postural strength (nautilus bar) OH press 3x10 for improved postural  strength (nautilus bar)  Neuromuscular Re-education: Gait in hallway with large amplitude strides for improved gait cycle // bars: Large amplitude heel strike gait with step reduction over a given distance with visual cues, hidden obstacle surface, faded UE support Large amplitude UE/LE gait for improved coordination of movement "Stepping stone" large amplitude gait using airex pads and 6" step for increased stride length, BUE faded to SUE support, SBA Static balance: airex pad- normal BOS with motor dual task (vertical ball toss x20, 2# med ball x20, 4# med ball x20), SBA Static balance: firm and compliant surface, normal BOS with toss to PT, 2# med ball x20, SBA Alternating cone taps with foot, airex pad, faded UE support,  CGA  Patient educated throughout session on appropriate technique and form using multi-modal cueing, HEP, and activity modification.   Patient Response to interventions: Patient reports fatigue.  ASSESSMENT Patient presents to clinic with excellent motivation to participate in therapy. Patient demonstrates deficits in gait, posture, pain, and falls risk. Patient able to maintain balance independently with weighted ball toss/catch on firm and compliant surfaces during today's session and responded positively to all active interventions. Patient will benefit from continued skilled therapeutic intervention to address remaining deficits in gait, posture, pain, and falls risk in order to increase function and improve overall QOL.    PT Long Term Goals - 08/11/20 1550      PT LONG TERM GOAL #1   Title Patient will be independent with HEP in order to improve strength and balance in order to decrease fall risk and improve function at home and work.    Baseline IE: none provided at this time    Time 6    Period Weeks    Status New    Target Date 09/22/20      PT LONG TERM GOAL #2   Title Patient will improve DGI by at least 3 points in order to demonstrate clinically significant improvement in balance and decreased risk for falls.    Baseline IE: to be assessed at next session    Time 6    Period Weeks    Status New    Target Date 09/22/20      PT LONG TERM GOAL #3   Title Patient will decrease 5TSTS by at least 3 seconds without UE support and from 17-18" seat height in order to demonstrate clinically significant improvement in LE strength..    Baseline IE: 18.5 sec from 23.5" seat    Time 6    Period Weeks    Status New    Target Date 09/22/20      PT LONG TERM GOAL #4   Title Patient will increase 10 meter walk test to >1.7m/s as to improve gait speed for better community ambulation and to reduce fall risk.    Baseline IE: 0.74m/s, SPC (self-selected)    Time 6    Period Weeks     Status New    Target Date 09/22/20      PT LONG TERM GOAL #5   Title Patient will demonstrate improved function as evidenced by a score of 65 on FOTO measure for full participation in activities at home and in the community.    Baseline IE: 49    Time 6    Period Weeks    Status New    Target Date 09/22/20                 Plan - 08/31/20 0947    Clinical Impression  Statement Patient presents to clinic with excellent motivation to participate in therapy. Patient demonstrates deficits in gait, posture, pain, and falls risk. Patient able to maintain balance independently with weighted ball toss/catch on firm and compliant surfaces during today's session and responded positively to all active interventions. Patient will benefit from continued skilled therapeutic intervention to address remaining deficits in gait, posture, pain, and falls risk in order to increase function and improve overall QOL.    Personal Factors and Comorbidities Comorbidity 3+;Fitness;Time since onset of injury/illness/exacerbation;Past/Current Experience    Comorbidities HTN, DM, HLD,    Examination-Activity Limitations Lift;Bend;Squat;Transfers;Stand;Stairs;Carry;Reach Overhead;Locomotion Level    Examination-Participation Restrictions Interpersonal Relationship;Yard Work;Laundry;Cleaning;Community Activity;Shop    Stability/Clinical Decision Making Evolving/Moderate complexity    Rehab Potential Fair    PT Frequency 2x / week    PT Duration 6 weeks    PT Treatment/Interventions ADLs/Self Care Home Management;Moist Heat;Electrical Stimulation;Cryotherapy;DME Instruction;Therapeutic activities;Gait training;Stair training;Therapeutic exercise;Balance training;Neuromuscular re-education;Functional mobility training;Energy conservation;Manual techniques;Patient/family education;Orthotic Fit/Training    PT Next Visit Plan large amplitude training    PT Home Exercise Plan none initiated at this time    Consulted and  Agree with Plan of Care Patient           Patient will benefit from skilled therapeutic intervention in order to improve the following deficits and impairments:  Abnormal gait,Decreased balance,Pain,Postural dysfunction,Decreased coordination,Decreased activity tolerance,Improper body mechanics,Difficulty walking,Decreased mobility,Decreased endurance  Visit Diagnosis: Difficulty in walking, not elsewhere classified  Personal history of fall  Abnormal posture     Problem List Patient Active Problem List   Diagnosis Date Noted  . Pain due to onychomycosis of toenails of both feet 06/18/2020  . Rash of foot 01/30/2020  . Stage 3 chronic kidney disease ([place]) 10/23/2019  . Type 2 diabetes mellitus with diabetic neuropathy, unspecified ([place]) 10/21/2019  . Impaired gait and mobility 10/14/2019  . Generalized weakness   . Postural dizziness with presyncope 10/13/2019  . Ventricular premature complexes 10/13/2019  . Urge urinary incontinence 09/26/2019  . Depressed mood 09/26/2019  . Advanced care planning/counseling discussion 09/22/2018  . Weakness of both lower extremities 09/22/2018  . History of hepatitis 01/05/2018  . 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 NAFLD (nonalcoholic fatty liver disease) 09/30/2017  . Obesity, Class I, BMI 30.0-34.9 (see actual BMI) 09/18/2017  . Polyarthralgia 02/15/2017  . OSA (obstructive sleep apnea) 02/15/2017  . Partial epilepsy with impairment of consciousness ([place]) 01/29/2016  . Low vitamin B12 level 05/27/2015  . Elevated PSA 05/27/2015  . Vitamin D deficiency 03/02/2015  . Other long term (current) drug therapy 03/12/2012  . Health maintenance examination 04/26/2011  . TOTAL KNEE REPLACEMENT, LEFT, HX OF 04/07/2010  . Type 2 diabetes mellitus with diabetic nephropathy ([place]) 03/30/2010  . Hyperlipidemia associated with type 2 diabetes mellitus ([place]) 03/30/2010  . 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 Essential hypertension, benign 03/30/2010   Myles Gip PT, DPT [phone]  08/31/2020, 11:05 AM  [place] [facility] [address] [place], [place], [address] Phone: [phone]   Fax:  [phone]  Name: Kirk Bennett. MRN: [id number] Date of Birth: Jun 23, 1951

## 2020-09-01 ENCOUNTER — Encounter: Payer: 59 | Admitting: Physical Therapy

## 2020-09-03 ENCOUNTER — Other Ambulatory Visit: Payer: Self-pay

## 2020-09-03 ENCOUNTER — Ambulatory Visit: Payer: 59 | Admitting: Physical Therapy

## 2020-09-03 ENCOUNTER — Encounter: Payer: Self-pay | Admitting: Physical Therapy

## 2020-09-03 DIAGNOSIS — R262 Difficulty in walking, not elsewhere classified: Secondary | ICD-10-CM

## 2020-09-03 DIAGNOSIS — Z9181 History of falling: Secondary | ICD-10-CM

## 2020-09-03 DIAGNOSIS — R293 Abnormal posture: Secondary | ICD-10-CM

## 2020-09-03 NOTE — Therapy (Signed)
[facility] [address]. [place], [place], [address] Phone: [phone]   Fax:  [phone]  Physical Therapy Treatment  Patient Details  Name: Kirk Bennett. MRN: [id number] Date of Birth: 1951-07-03 Referring Provider (PT): Holley Raring, [place]   Encounter Date: 09/03/2020   PT End of Session - 09/03/20 1047    Visit Number 8    Number of Visits 13    Date for PT Re-Evaluation 09/22/20    PT Start Time 0955    PT Stop Time 1037    PT Time Calculation (min) 42 min    Equipment Utilized During Treatment Gait belt    Activity Tolerance Patient tolerated treatment well;Patient limited by fatigue    Behavior During Therapy WFL for tasks assessed/performed           Past Medical History:  Diagnosis Date  . Cataracts, bilateral 02/2011   and suspected glaucoma, to return for f/u, no diabetic retinopathy  . CKD stage 3 due to type 2 diabetes mellitus ([place]) 2015   normal renal US, self referred to Dr Holley Raring  . 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 Mora Bellman at [facility])  . Depression    found by neuro  . Elevated PSA 05/27/2015   Serial monitoring ([place])   . Glaucoma 2015   suspect  . History of hepatitis A   . HLD (hyperlipidemia)   . HTN (hypertension)   . Knee pain    s/p replacement  . SVT (supraventricular tachycardia) ([place]) 1996  . Vitamin D deficiency 03/02/2015  . 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 Gabriel Carina endo --> 02/2016 decided to return to PCP for DM care    Past Surgical History:  Procedure Laterality Date  . CARDIAC CATHETERIZATION  July 2010   No blockages (Dr. Liliane Shi)  . COLONOSCOPY  09/16/2005   hyperplastic polyps, rpt due 10 yrs   . COLONOSCOPY WITH PROPOFOL N/A 12/11/2015   mult polyps, few TA, rpt 47yrs ([place])  . corrective surgery  amblyopia  1957  . Cystectomy or meningioma removal brain  1990   (unclear)  . Left knee surgery  1971   Torn ACL  . REPLACEMENT TOTAL KNEE  April 2011   Left [facility] Dr. Garald Balding)    There were no vitals filed for this visit.   Subjective Assessment - 09/03/20 1046    Subjective Patient denies any changes since last visit.    Pertinent History Patient familiar to clinic as he was seen previously for BLE weakness. Patient achieved goals with last course of PT and was able to self-manage. He notes he has maintained his level of strength and is still able to stand from couch in family room/den without complaint.    Limitations Lifting;Standing;Walking;House hold activities    How long can you sit comfortably? unlimited    How long can you stand comfortably? 30 min (9/10 pain)    How long can you walk comfortably? 30 min (9/10 pain)    Patient Stated Goals "work the back out"    Currently in Pain? No/denies           TREATMENT Therapeutic Exercise: NuStep, L1, seat 15, x10 min, SPM target 70-80 for improved activity tolerance Chest press 3x10 for improved postural strength (3# CW on cane) OH press 3x10 for improved postural strength (3# CW  on cane) STS from 22.5" surface, 4x5 STS from 21" surface, 3x5 Ladder gait with 3# CW, SPC  Neuromuscular Re-education: Gait in hallway with large amplitude strides for improved gait cycle, SPC Gait in ladder with visual cues for increased stride length, SPC   Patient educated throughout session on appropriate technique and form using multi-modal cueing, HEP, and activity modification.   Patient Response to interventions: Patient reports fatigue.  ASSESSMENT Patient presents to clinic with excellent motivation to participate in therapy. Patient demonstrates deficits in gait, posture, pain, and falls risk. Patient tolerated increased strengthening activities with good form and limited rest during today's session and responded positively to  all active interventions. Patient will benefit from continued skilled therapeutic intervention to address remaining deficits in gait, posture, pain, and falls risk in order to increase function and improve overall QOL.     PT Long Term Goals - 08/11/20 1550      PT LONG TERM GOAL #1   Title Patient will be independent with HEP in order to improve strength and balance in order to decrease fall risk and improve function at home and work.    Baseline IE: none provided at this time    Time 6    Period Weeks    Status New    Target Date 09/22/20      PT LONG TERM GOAL #2   Title Patient will improve DGI by at least 3 points in order to demonstrate clinically significant improvement in balance and decreased risk for falls.    Baseline IE: to be assessed at next session    Time 6    Period Weeks    Status New    Target Date 09/22/20      PT LONG TERM GOAL #3   Title Patient will decrease 5TSTS by at least 3 seconds without UE support and from 17-18" seat height in order to demonstrate clinically significant improvement in LE strength..    Baseline IE: 18.5 sec from 23.5" seat    Time 6    Period Weeks    Status New    Target Date 09/22/20      PT LONG TERM GOAL #4   Title Patient will increase 10 meter walk test to >1.72m/s as to improve gait speed for better community ambulation and to reduce fall risk.    Baseline IE: 0.70m/s, SPC (self-selected)    Time 6    Period Weeks    Status New    Target Date 09/22/20      PT LONG TERM GOAL #5   Title Patient will demonstrate improved function as evidenced by a score of 65 on FOTO measure for full participation in activities at home and in the community.    Baseline IE: 34    Time 6    Period Weeks    Status New    Target Date 09/22/20                 Plan - 09/03/20 1048    Clinical Impression Statement Patient presents to clinic with excellent motivation to participate in therapy. Patient demonstrates deficits in gait,  posture, pain, and falls risk. Patient tolerated increased strengthening activities with good form and limited rest during today's session and responded positively to all active interventions. Patient will benefit from continued skilled therapeutic intervention to address remaining deficits in gait, posture, pain, and falls risk in order to increase function and improve overall QOL.    Personal Factors and Comorbidities Comorbidity  3+;Fitness;Time since onset of injury/illness/exacerbation;Past/Current Experience    Comorbidities HTN, DM, HLD,    Examination-Activity Limitations Lift;Bend;Squat;Transfers;Stand;Stairs;Carry;Reach Overhead;Locomotion Level    Examination-Participation Restrictions Interpersonal Relationship;Yard Work;Laundry;Cleaning;Community Activity;Shop    Stability/Clinical Decision Making Evolving/Moderate complexity    Rehab Potential Fair    PT Frequency 2x / week    PT Duration 6 weeks    PT Treatment/Interventions ADLs/Self Care Home Management;Moist Heat;Electrical Stimulation;Cryotherapy;DME Instruction;Therapeutic activities;Gait training;Stair training;Therapeutic exercise;Balance training;Neuromuscular re-education;Functional mobility training;Energy conservation;Manual techniques;Patient/family education;Orthotic Fit/Training    PT Next Visit Plan large amplitude training    Consulted and Agree with Plan of Care Patient           Patient will benefit from skilled therapeutic intervention in order to improve the following deficits and impairments:  Abnormal gait,Decreased balance,Pain,Postural dysfunction,Decreased coordination,Decreased activity tolerance,Improper body mechanics,Difficulty walking,Decreased mobility,Decreased endurance  Visit Diagnosis: Difficulty in walking, not elsewhere classified  Personal history of fall  Abnormal posture     Problem List Patient Active Problem List   Diagnosis Date Noted  . Pain due to onychomycosis of toenails of  both feet 06/18/2020  . Rash of foot 01/30/2020  . Stage 3 chronic kidney disease ([place]) 10/23/2019  . Type 2 diabetes mellitus with diabetic neuropathy, unspecified ([place]) 10/21/2019  . Impaired gait and mobility 10/14/2019  . Generalized weakness   . Postural dizziness with presyncope 10/13/2019  . Ventricular premature complexes 10/13/2019  . Urge urinary incontinence 09/26/2019  . Depressed mood 09/26/2019  . Advanced care planning/counseling discussion 09/22/2018  . Weakness of both lower extremities 09/22/2018  . History of hepatitis 01/05/2018  . 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 NAFLD (nonalcoholic fatty liver disease) 09/30/2017  . Obesity, Class I, BMI 30.0-34.9 (see actual BMI) 09/18/2017  . Polyarthralgia 02/15/2017  . OSA (obstructive sleep apnea) 02/15/2017  . Partial epilepsy with impairment of consciousness ([place]) 01/29/2016  . Low vitamin B12 level 05/27/2015  . Elevated PSA 05/27/2015  . Vitamin D deficiency 03/02/2015  . Other long term (current) drug therapy 03/12/2012  . Health maintenance examination 04/26/2011  . TOTAL KNEE REPLACEMENT, LEFT, HX OF 04/07/2010  . Type 2 diabetes mellitus with diabetic nephropathy ([place]) 03/30/2010  . Hyperlipidemia associated with type 2 diabetes mellitus ([place]) 03/30/2010  . 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 Essential hypertension, benign 03/30/2010   Myles Gip PT, DPT [phone]  09/03/2020, 10:52 AM  [facility] [address] [place], [place], [address] Phone: [phone]   Fax:  [phone]  Name: [name] MRN: [id number] Date of Birth: 10-28-1950

## 2020-09-08 ENCOUNTER — Ambulatory Visit: Payer: 59 | Admitting: Physical Therapy

## 2020-09-10 ENCOUNTER — Ambulatory Visit: Payer: 59 | Admitting: Physical Therapy

## 2020-09-15 ENCOUNTER — Ambulatory Visit: Payer: 59 | Admitting: Physical Therapy

## 2020-09-15 ENCOUNTER — Other Ambulatory Visit: Payer: Self-pay

## 2020-09-15 ENCOUNTER — Encounter: Payer: Self-pay | Admitting: Physical Therapy

## 2020-09-15 DIAGNOSIS — R262 Difficulty in walking, not elsewhere classified: Secondary | ICD-10-CM | POA: Diagnosis not present

## 2020-09-15 DIAGNOSIS — R293 Abnormal posture: Secondary | ICD-10-CM

## 2020-09-15 DIAGNOSIS — Z9181 History of falling: Secondary | ICD-10-CM

## 2020-09-15 NOTE — Therapy (Signed)
[facility] [address] [place], [place], [address] Phone: [phone]   Fax:  [phone]  Physical Therapy Treatment  Patient Details  Name: Kirk Bennett. MRN: [id number] Date of Birth: 26-May-1951 Referring Provider (PT): Holley Raring, [place]   Encounter Date: 09/15/2020   PT End of Session - 09/15/20 1222    Visit Number 9    Number of Visits 13    Date for PT Re-Evaluation 09/22/20    PT Start Time 1000    PT Stop Time 1055    PT Time Calculation (min) 55 min    Equipment Utilized During Treatment Gait belt    Activity Tolerance Patient tolerated treatment well;Patient limited by fatigue    Behavior During Therapy WFL for tasks assessed/performed           Past Medical History:  Diagnosis Date  . Cataracts, bilateral 02/2011   and suspected glaucoma, to return for f/u, no diabetic retinopathy  . CKD stage 3 due to type 2 diabetes mellitus ([place]) 2015   normal renal US, self referred to Dr Holley Raring  . 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 Mora Bellman at [facility])  . Depression    found by neuro  . Elevated PSA 05/27/2015   Serial monitoring ([place])   . Glaucoma 2015   suspect  . History of hepatitis A   . HLD (hyperlipidemia)   . HTN (hypertension)   . Knee pain    s/p replacement  . SVT (supraventricular tachycardia) ([place]) 1996  . Vitamin D deficiency 03/02/2015  . 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 Gabriel Carina endo --> 02/2016 decided to return to PCP for DM care    Past Surgical History:  Procedure Laterality Date  . CARDIAC CATHETERIZATION  July 2010   No blockages (Dr. Liliane Shi)  . COLONOSCOPY  09/16/2005   hyperplastic polyps, rpt due 10 yrs   . COLONOSCOPY WITH PROPOFOL N/A 12/11/2015   mult polyps, few TA, rpt 30yrs ([place])  . corrective surgery  amblyopia  1957  . Cystectomy or meningioma removal brain  1990   (unclear)  . Left knee surgery  1971   Torn ACL  . REPLACEMENT TOTAL KNEE  April 2011   Left [facility] Dr. Garald Balding)    There were no vitals filed for this visit.   Subjective Assessment - 09/15/20 1221    Subjective Patient reports that he had a fall since last session. Patient was in yard prior to snow fall and slipped and fell on his backside. Patient was unable to get up independently and his wife contacted a neighbor to assist with the ground to standing transfer. Patient denies any increased pain or indication of injury since falling.    Pertinent History Patient familiar to clinic as he was seen previously for BLE weakness. Patient achieved goals with last course of PT and was able to self-manage. He notes he has maintained his level of strength and is still able to stand from couch in family room/den without complaint.    Limitations Lifting;Standing;Walking;House hold activities    How long can you sit comfortably? unlimited    How long can you stand comfortably? 30 min (9/10 pain)    How long can you walk comfortably? 30 min (9/10 pain)    Patient Stated Goals "work the back out"  TREATMENT Therapeutic Exercise: NuStep, L1, seat 15, x8 min, SPM target 70-80 for improved activity tolerance GTB BUE strengthening for improved ability to floor transfer:  Horizontal abduction, x15  Rows, x15  Flexion, x15  Abduction, x15  Circles, x15  Neuromuscular Re-education: Gait in hallway with large amplitude strides for improved gait cycle, SPC Floor transfers with bench/table support, mod VCs for sequencing. Patient unable to perform safely at this time 2/2 to limitations in L knee and BLE strength as well as BUE strength.   Patient educated throughout session on appropriate technique and form using multi-modal cueing, HEP, and activity modification.   Patient Response to interventions: Patient reports  fatigue.  ASSESSMENT Patient presents to clinic with excellent motivation to participate in therapy. Patient demonstrates deficits in gait, posture, pain, and falls risk. Patient unable to perform floor transfer safely but deficits in shoulder stability and BUE strength were identified during today's session and patient responded positively to all active interventions. Patient will benefit from continued skilled therapeutic intervention to address remaining deficits in gait, posture, pain, and falls risk in order to increase function and improve overall QOL.     PT Long Term Goals - 08/11/20 1550      PT LONG TERM GOAL #1   Title Patient will be independent with HEP in order to improve strength and balance in order to decrease fall risk and improve function at home and work.    Baseline IE: none provided at this time    Time 6    Period Weeks    Status New    Target Date 09/22/20      PT LONG TERM GOAL #2   Title Patient will improve DGI by at least 3 points in order to demonstrate clinically significant improvement in balance and decreased risk for falls.    Baseline IE: to be assessed at next session    Time 6    Period Weeks    Status New    Target Date 09/22/20      PT LONG TERM GOAL #3   Title Patient will decrease 5TSTS by at least 3 seconds without UE support and from 17-18" seat height in order to demonstrate clinically significant improvement in LE strength..    Baseline IE: 18.5 sec from 23.5" seat    Time 6    Period Weeks    Status New    Target Date 09/22/20      PT LONG TERM GOAL #4   Title Patient will increase 10 meter walk test to >1.51m/s as to improve gait speed for better community ambulation and to reduce fall risk.    Baseline IE: 0.37m/s, SPC (self-selected)    Time 6    Period Weeks    Status New    Target Date 09/22/20      PT LONG TERM GOAL #5   Title Patient will demonstrate improved function as evidenced by a score of 65 on FOTO measure for full  participation in activities at home and in the community.    Baseline IE: 65    Time 6    Period Weeks    Status New    Target Date 09/22/20                 Plan - 09/15/20 1222    Clinical Impression Statement Patient presents to clinic with excellent motivation to participate in therapy. Patient demonstrates deficits in gait, posture, pain, and falls risk. Patient unable to perform floor transfer safely  but deficits in shoulder stability and BUE strength were identified during today's session and patient responded positively to all active interventions. Patient will benefit from continued skilled therapeutic intervention to address remaining deficits in gait, posture, pain, and falls risk in order to increase function and improve overall QOL.    Personal Factors and Comorbidities Comorbidity 3+;Fitness;Time since onset of injury/illness/exacerbation;Past/Current Experience    Comorbidities HTN, DM, HLD,    Examination-Activity Limitations Lift;Bend;Squat;Transfers;Stand;Stairs;Carry;Reach Overhead;Locomotion Level    Examination-Participation Restrictions Interpersonal Relationship;Yard Work;Laundry;Cleaning;Community Activity;Shop    Stability/Clinical Decision Making Evolving/Moderate complexity    Rehab Potential Fair    PT Frequency 2x / week    PT Duration 6 weeks    PT Treatment/Interventions ADLs/Self Care Home Management;Moist Heat;Electrical Stimulation;Cryotherapy;DME Instruction;Therapeutic activities;Gait training;Stair training;Therapeutic exercise;Balance training;Neuromuscular re-education;Functional mobility training;Energy conservation;Manual techniques;Patient/family education;Orthotic Fit/Training    PT Next Visit Plan large amplitude training    Consulted and Agree with Plan of Care Patient           Patient will benefit from skilled therapeutic intervention in order to improve the following deficits and impairments:  Abnormal gait,Decreased  balance,Pain,Postural dysfunction,Decreased coordination,Decreased activity tolerance,Improper body mechanics,Difficulty walking,Decreased mobility,Decreased endurance  Visit Diagnosis: Difficulty in walking, not elsewhere classified  Personal history of fall  Abnormal posture     Problem List Patient Active Problem List   Diagnosis Date Noted  . Pain due to onychomycosis of toenails of both feet 06/18/2020  . Rash of foot 01/30/2020  . Stage 3 chronic kidney disease ([place]) 10/23/2019  . Type 2 diabetes mellitus with diabetic neuropathy, unspecified ([place]) 10/21/2019  . Impaired gait and mobility 10/14/2019  . Generalized weakness   . Postural dizziness with presyncope 10/13/2019  . Ventricular premature complexes 10/13/2019  . Urge urinary incontinence 09/26/2019  . Depressed mood 09/26/2019  . Advanced care planning/counseling discussion 09/22/2018  . Weakness of both lower extremities 09/22/2018  . History of hepatitis 01/05/2018  . 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 NAFLD (nonalcoholic fatty liver disease) 09/30/2017  . Obesity, Class I, BMI 30.0-34.9 (see actual BMI) 09/18/2017  . Polyarthralgia 02/15/2017  . OSA (obstructive sleep apnea) 02/15/2017  . Partial epilepsy with impairment of consciousness ([place]) 01/29/2016  . Low vitamin B12 level 05/27/2015  . Elevated PSA 05/27/2015  . Vitamin D deficiency 03/02/2015  . Other long term (current) drug therapy 03/12/2012  . Health maintenance examination 04/26/2011  . TOTAL KNEE REPLACEMENT, LEFT, HX OF 04/07/2010  . Type 2 diabetes mellitus with diabetic nephropathy ([place]) 03/30/2010  . Hyperlipidemia associated with type 2 diabetes mellitus ([place]) 03/30/2010  . 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 Essential hypertension, benign 03/30/2010   Myles Gip PT, DPT [phone]  09/15/2020, 12:32 PM  [facility] [address] [place], [place], [address] Phone: [phone]   Fax:  [phone]  Name: Kirk Bennett. MRN: [id number] Date of Birth: 10/07/1950

## 2020-09-17 ENCOUNTER — Other Ambulatory Visit: Payer: Self-pay

## 2020-09-17 ENCOUNTER — Ambulatory Visit: Payer: 59 | Admitting: Physical Therapy

## 2020-09-17 ENCOUNTER — Encounter: Payer: Self-pay | Admitting: Physical Therapy

## 2020-09-17 DIAGNOSIS — R293 Abnormal posture: Secondary | ICD-10-CM

## 2020-09-17 DIAGNOSIS — Z9181 History of falling: Secondary | ICD-10-CM

## 2020-09-17 DIAGNOSIS — R262 Difficulty in walking, not elsewhere classified: Secondary | ICD-10-CM | POA: Diagnosis not present

## 2020-09-17 NOTE — Therapy (Signed)
[facility] [address] [place], [place], [address] Phone: [phone]   Fax:  [phone]  Physical Therapy Treatment Physical Therapy Progress Note   Dates of reporting period  08/11/2020   to   09/17/2020   Patient Details  Name: Kirk Bennett. MRN: [id number] Date of Birth: Aug 22, 1951 Referring Provider (PT): Holley Raring, [place]   Encounter Date: 09/17/2020   PT End of Session - 09/17/20 1631    Visit Number 10    Number of Visits 13    Date for PT Re-Evaluation 09/22/20    PT Start Time 1000    PT Stop Time 1045    PT Time Calculation (min) 45 min    Equipment Utilized During Treatment Gait belt    Activity Tolerance Patient tolerated treatment well;Patient limited by fatigue    Behavior During Therapy WFL for tasks assessed/performed           Past Medical History:  Diagnosis Date  . Cataracts, bilateral 02/2011   and suspected glaucoma, to return for f/u, no diabetic retinopathy  . CKD stage 3 due to type 2 diabetes mellitus ([place]) 2015   normal renal US, self referred to Dr Holley Raring  . 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 Mora Bellman at [facility])  . Depression    found by neuro  . Elevated PSA 05/27/2015   Serial monitoring ([place])   . Glaucoma 2015   suspect  . History of hepatitis A   . HLD (hyperlipidemia)   . HTN (hypertension)   . Knee pain    s/p replacement  . SVT (supraventricular tachycardia) ([place]) 1996  . Vitamin D deficiency 03/02/2015  . 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 Gabriel Carina endo --> 02/2016 decided to return to PCP for DM care    Past Surgical History:  Procedure Laterality Date  . CARDIAC CATHETERIZATION  July 2010   No blockages (Dr. Liliane Shi)  . COLONOSCOPY  09/16/2005   hyperplastic polyps, rpt due 10 yrs   . COLONOSCOPY  WITH PROPOFOL N/A 12/11/2015   mult polyps, few TA, rpt 27yrs ([place])  . corrective surgery amblyopia  1957  . Cystectomy or meningioma removal brain  1990   (unclear)  . Left knee surgery  1971   Torn ACL  . REPLACEMENT TOTAL KNEE  April 2011   Left [facility] Dr. Garald Balding)    There were no vitals filed for this visit.   Subjective Assessment - 09/17/20 1630    Subjective Patient notes he performed his arm exercises yesterday and stopped reps before increased soreness. Patient denies any other significant changes since last session.    Pertinent History Patient familiar to clinic as he was seen previously for BLE weakness. Patient achieved goals with last course of PT and was able to self-manage. He notes he has maintained his level of strength and is still able to stand from couch in family room/den without complaint.    Limitations Lifting;Standing;Walking;House hold activities    How long can you sit comfortably? unlimited    How long can you stand comfortably? 30 min (9/10 pain)    How long can you walk comfortably? 30 min (9/10 pain)    Patient Stated Goals "work the back out"           TREATMENT Therapeutic Exercise: NuStep, L2-4, seat 15, x10  min, SPM target 70-80 for improved activity tolerance (resistance adjusted throughout for "hill climb" workout) GTB BUE strengthening for improved ability to floor transfer:  Horizontal abduction, x15  Rows, x15  Flexion, x15  Abduction, x15  Circles, x15 BLE strengthening 4# CW:  Seated hip flexion, 2x15  LAQ, 2x15  Standing hip abduction, x10  Standing hamstring curl, x10 STS from 23.5" seat 5x5   Patient educated throughout session on appropriate technique and form using multi-modal cueing, HEP, and activity modification.   Patient Response to interventions: Patient discontinued session 2/2 to feeling unwell. Patient assured DPT he felt well enough to transport himself home and agreed to call clinic to confirm safe arrival.  Patient called clinic at 11:17A confirming safe arrival.  ASSESSMENT Patient presents to clinic with excellent motivation to participate in therapy. Patient demonstrates deficits in gait, posture, pain, and falls risk. Patient able to participate in increased strengthening activities during today's session and patient responded positively to interventions. Patient discontinued session early 2/2 to feeling unwell but denies any concerns with exercises prescribed. Patient's condition has the potential to improve in response to therapy. Maximum improvement is yet to be obtained. The anticipated improvement is attainable and reasonable in a generally predictable time. Patient will benefit from continued skilled therapeutic intervention to address remaining deficits in gait, posture, pain, and falls risk in order to increase function and improve overall QOL.     PT Long Term Goals - 09/17/20 1633      PT LONG TERM GOAL #1   Title Patient will be independent with HEP in order to improve strength and balance in order to decrease fall risk and improve function at home and work.    Baseline IE: none provided at this time; 1/27: IND    Time 6    Period Weeks    Status Achieved      PT LONG TERM GOAL #2   Title Patient will improve DGI by at least 3 points in order to demonstrate clinically significant improvement in balance and decreased risk for falls.    Baseline IE: to be assessed at next session; 12/23: 12    Time 6    Period Weeks    Status On-going    Target Date 09/22/20      PT LONG TERM GOAL #3   Title Patient will decrease 5TSTS by at least 3 seconds without UE support and from 17-18" seat height in order to demonstrate clinically significant improvement in LE strength..    Baseline IE: 18.5 sec from 23.5" seat; 1/27: 16.8 sec from 23.5 " seat    Time 6    Period Weeks    Status On-going    Target Date 09/22/20      PT LONG TERM GOAL #4   Title Patient will increase 10 meter walk  test to >1.14m/s as to improve gait speed for better community ambulation and to reduce fall risk.    Baseline IE: 0.28m/s, SPC (self-selected)    Time 6    Period Weeks    Status On-going    Target Date 09/22/20      PT LONG TERM GOAL #5   Title Patient will demonstrate improved function as evidenced by a score of 65 on FOTO measure for full participation in activities at home and in the community.    Baseline IE: 69;    Time 6    Period Weeks    Status On-going    Target Date 09/22/20  Plan - 09/17/20 1633    Clinical Impression Statement Patient presents to clinic with excellent motivation to participate in therapy. Patient demonstrates deficits in gait, posture, pain, and falls risk. Patient able to participate in increased strengthening activities during today's session and patient responded positively to interventions. Patient discontinued session early 2/2 to feeling unwell but denies any concerns with exercises prescribed. Patient's condition has the potential to improve in response to therapy. Maximum improvement is yet to be obtained. The anticipated improvement is attainable and reasonable in a generally predictable time. Patient will benefit from continued skilled therapeutic intervention to address remaining deficits in gait, posture, pain, and falls risk in order to increase function and improve overall QOL.    Personal Factors and Comorbidities Comorbidity 3+;Fitness;Time since onset of injury/illness/exacerbation;Past/Current Experience    Comorbidities HTN, DM, HLD,    Examination-Activity Limitations Lift;Bend;Squat;Transfers;Stand;Stairs;Carry;Reach Overhead;Locomotion Level    Examination-Participation Restrictions Interpersonal Relationship;Yard Work;Laundry;Cleaning;Community Activity;Shop    Stability/Clinical Decision Making Evolving/Moderate complexity    Rehab Potential Fair    PT Frequency 2x / week    PT Duration 6 weeks    PT  Treatment/Interventions ADLs/Self Care Home Management;Moist Heat;Electrical Stimulation;Cryotherapy;DME Instruction;Therapeutic activities;Gait training;Stair training;Therapeutic exercise;Balance training;Neuromuscular re-education;Functional mobility training;Energy conservation;Manual techniques;Patient/family education;Orthotic Fit/Training    PT Next Visit Plan large amplitude training    Consulted and Agree with Plan of Care Patient           Patient will benefit from skilled therapeutic intervention in order to improve the following deficits and impairments:  Abnormal gait,Decreased balance,Pain,Postural dysfunction,Decreased coordination,Decreased activity tolerance,Improper body mechanics,Difficulty walking,Decreased mobility,Decreased endurance  Visit Diagnosis: Difficulty in walking, not elsewhere classified  Personal history of fall  Abnormal posture     Problem List Patient Active Problem List   Diagnosis Date Noted  . Pain due to onychomycosis of toenails of both feet 06/18/2020  . Rash of foot 01/30/2020  . Stage 3 chronic kidney disease ([place]) 10/23/2019  . Type 2 diabetes mellitus with diabetic neuropathy, unspecified ([place]) 10/21/2019  . Impaired gait and mobility 10/14/2019  . Generalized weakness   . Postural dizziness with presyncope 10/13/2019  . Ventricular premature complexes 10/13/2019  . Urge urinary incontinence 09/26/2019  . Depressed mood 09/26/2019  . Advanced care planning/counseling discussion 09/22/2018  . Weakness of both lower extremities 09/22/2018  . History of hepatitis 01/05/2018  . 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 NAFLD (nonalcoholic fatty liver disease) 09/30/2017  . Obesity, Class I, BMI 30.0-34.9 (see actual BMI) 09/18/2017  . Polyarthralgia 02/15/2017  . OSA (obstructive sleep apnea) 02/15/2017  . Partial epilepsy with impairment of consciousness ([place]) 01/29/2016  . Low vitamin B12 level 05/27/2015  . Elevated  PSA 05/27/2015  . Vitamin D deficiency 03/02/2015  . Other long term (current) drug therapy 03/12/2012  . Health maintenance examination 04/26/2011  . TOTAL KNEE REPLACEMENT, LEFT, HX OF 04/07/2010  . Type 2 diabetes mellitus with diabetic nephropathy ([place]) 03/30/2010  . Hyperlipidemia associated with type 2 diabetes mellitus ([place]) 03/30/2010  . 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 Essential hypertension, benign 03/30/2010   Myles Gip PT, DPT [phone]  09/17/2020, 4:41 PM  [facility] [address] [place], [place], [address] Phone: [phone]   Fax:  [phone]  Name: Kirk Bennett. MRN: [id number] Date of Birth: [birth year]

## 2020-09-21 ENCOUNTER — Ambulatory Visit: Payer: Medicare Other | Admitting: Podiatry

## 2020-09-22 ENCOUNTER — Other Ambulatory Visit: Payer: Self-pay

## 2020-09-22 ENCOUNTER — Ambulatory Visit: Payer: 59 | Attending: Nephrology | Admitting: Physical Therapy

## 2020-09-22 ENCOUNTER — Encounter: Payer: Self-pay | Admitting: Physical Therapy

## 2020-09-22 DIAGNOSIS — R293 Abnormal posture: Secondary | ICD-10-CM | POA: Insufficient documentation

## 2020-09-22 DIAGNOSIS — R262 Difficulty in walking, not elsewhere classified: Secondary | ICD-10-CM | POA: Insufficient documentation

## 2020-09-22 DIAGNOSIS — M6281 Muscle weakness (generalized): Secondary | ICD-10-CM | POA: Diagnosis present

## 2020-09-22 DIAGNOSIS — Z9181 History of falling: Secondary | ICD-10-CM | POA: Diagnosis present

## 2020-09-22 NOTE — Therapy (Signed)
[facility] [address] [place], [place], [address] Phone: [phone]   Fax:  [phone]  Physical Therapy Treatment  Patient Details  Name: Kirk Bennett. MRN: [id number] Date of Birth: 07-Aug-1951 Referring Provider (PT): Holley Raring, [place]   Encounter Date: 09/22/2020   PT End of Session - 09/22/20 1012    Visit Number 11    Number of Visits 21    Date for PT Re-Evaluation 10/20/20    PT Start Time 1000    PT Stop Time 1055    PT Time Calculation (min) 55 min    Equipment Utilized During Treatment Gait belt    Activity Tolerance Patient tolerated treatment well;Patient limited by fatigue    Behavior During Therapy WFL for tasks assessed/performed           Past Medical History:  Diagnosis Date  . Cataracts, bilateral 02/2011   and suspected glaucoma, to return for f/u, no diabetic retinopathy  . CKD stage 3 due to type 2 diabetes mellitus ([place]) 2015   normal renal US, self referred to Dr Holley Raring  . 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 Mora Bellman at [facility])  . Depression    found by neuro  . Elevated PSA 05/27/2015   Serial monitoring ([place])   . Glaucoma 2015   suspect  . History of hepatitis A   . HLD (hyperlipidemia)   . HTN (hypertension)   . Knee pain    s/p replacement  . SVT (supraventricular tachycardia) ([employer]) 1996  . Vitamin D deficiency 03/02/2015  . 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 Gabriel Carina endo --> 02/2016 decided to return to PCP for DM care    Past Surgical History:  Procedure Laterality Date  . CARDIAC CATHETERIZATION  July 2010   No blockages (Dr. Liliane Shi)  . COLONOSCOPY  09/16/2005   hyperplastic polyps, rpt due 10 yrs   . COLONOSCOPY WITH PROPOFOL N/A 12/11/2015   mult polyps, few TA, rpt 64yrs ([place])  . corrective surgery  amblyopia  1957  . Cystectomy or meningioma removal brain  1990   (unclear)  . Left knee surgery  1971   Torn ACL  . REPLACEMENT TOTAL KNEE  April 2011   Left [facility] Dr. Garald Balding)    There were no vitals filed for this visit.   Subjective Assessment - 09/22/20 1011    Subjective Patient states that he had an uneventful weekend. Denies any significant concerns or changes since last visit.    Pertinent History Patient familiar to clinic as he was seen previously for BLE weakness. Patient achieved goals with last course of PT and was able to self-manage. He notes he has maintained his level of strength and is still able to stand from couch in family room/den without complaint.    Limitations Lifting;Standing;Walking;House hold activities    How long can you sit comfortably? unlimited    How long can you stand comfortably? 30 min (9/10 pain)    How long can you walk comfortably? 30 min (9/10 pain)    Patient Stated Goals "work the back out"              [facility] PT Assessment - 09/22/20 1034      Dynamic Gait Index   Level Surface Mild Impairment    Change in Gait Speed Moderate Impairment  Gait with Horizontal Head Turns Mild Impairment    Gait with Vertical Head Turns Mild Impairment    Gait and Pivot Turn Mild Impairment    Step Over Obstacle Mild Impairment    Step Around Obstacles Mild Impairment    Steps Moderate Impairment    Total Score 14          TREATMENT Therapeutic Exercise: NuStep, L2-4, seat 15, x10 min, SPM target 70-80 for improved activity tolerance (resistance adjusted throughout for "hill climb" workout) STS from 20" seat, BUE support 2x5  Neuromuscular Re-education: Gait in hallway with large amplitude strides for improved gait cycle, SPC, SBA // bars: Gait with step reduction for improved stride length, VCs for arm swing, SBA Gait with variable surface and large amplitude strides, faded UE support, SBA Reassessed DGI and 74m walking speed.  Patient  educated throughout session on appropriate technique and form using multi-modal cueing, HEP, and activity modification.   Patient Response to interventions: Denies increased discomfort.   ASSESSMENT Patient presents to clinic with excellent motivation to participate in therapy. Patient demonstrates deficits in gait, posture, pain, and falls risk. Patient indicating progress toward all goals (see below) during today's session and patient has responded positively to neuromuscular re-education interventions focused on large amplitude training for decreased festination and freezing of gait (FOG) observed on evaluation. Patient continues to have some FOG/festination with directional changes and thresholds, but is able to self-correct with occasional verbal cues. Patient will benefit from continued skilled therapeutic intervention to address remaining deficits in gait, posture, pain, and falls risk in order to increase function and improve overall QOL.     PT Long Term Goals - 09/22/20 1020      PT LONG TERM GOAL #1   Title Patient will be independent with HEP in order to improve strength and balance in order to decrease fall risk and improve function at home and work.    Baseline IE: none provided at this time; 1/27: IND    Time 6    Period Weeks    Status Achieved      PT LONG TERM GOAL #2   Title Patient will improve DGI by at least 3 points in order to demonstrate clinically significant improvement in balance and decreased risk for falls.    Baseline IE: to be assessed at next session; 12/23: 12; 2/1: 14    Time 6    Period Weeks    Status On-going      PT LONG TERM GOAL #3   Title Patient will decrease 5TSTS by at least 3 seconds without UE support and from 17-18" seat height in order to demonstrate clinically significant improvement in LE strength..    Baseline IE: 18.5 sec from 23.5" seat; 1/27: 16.8 sec from 23.5 " seat; 2/1: 25.1 sec from 20" seat, BUE support    Time 6    Period  Weeks    Status On-going      PT LONG TERM GOAL #4   Title Patient will increase 10 meter walk test to >1.67m/s as to improve gait speed for better community ambulation and to reduce fall risk.    Baseline IE: 0.77m/s, SPC (self-selected); 2/1: 0.78m/s    Time 6    Period Weeks    Status On-going      PT LONG TERM GOAL #5   Title Patient will demonstrate improved function as evidenced by a score of 65 on FOTO measure for full participation in activities at home and in the  community.    Baseline IE: 64; 2/1: 61    Time 6    Period Weeks    Status On-going                 Plan - 09/22/20 1012    Clinical Impression Statement Patient presents to clinic with excellent motivation to participate in therapy. Patient demonstrates deficits in gait, posture, pain, and falls risk. Patient indicating progress toward all goals (see below) during today's session and patient has responded positively to neuromuscular re-education interventions focused on large amplitude training for decreased festination and freezing of gait (FOG) observed on evaluation. Patient continues to have some FOG/festination with directional changes and thresholds, but is able to self-correct with occasional verbal cues. Patient will benefit from continued skilled therapeutic intervention to address remaining deficits in gait, posture, pain, and falls risk in order to increase function and improve overall QOL.    Personal Factors and Comorbidities Comorbidity 3+;Fitness;Time since onset of injury/illness/exacerbation;Past/Current Experience    Comorbidities HTN, DM, HLD,    Examination-Activity Limitations Lift;Bend;Squat;Transfers;Stand;Stairs;Carry;Reach Overhead;Locomotion Level    Examination-Participation Restrictions Interpersonal Relationship;Yard Work;Laundry;Cleaning;Community Activity;Shop    Stability/Clinical Decision Making Evolving/Moderate complexity    Rehab Potential Fair    PT Frequency 2x / week    PT  Duration 6 weeks    PT Treatment/Interventions ADLs/Self Care Home Management;Moist Heat;Electrical Stimulation;Cryotherapy;DME Instruction;Therapeutic activities;Gait training;Stair training;Therapeutic exercise;Balance training;Neuromuscular re-education;Functional mobility training;Energy conservation;Manual techniques;Patient/family education;Orthotic Fit/Training    PT Next Visit Plan large amplitude training    Consulted and Agree with Plan of Care Patient           Patient will benefit from skilled therapeutic intervention in order to improve the following deficits and impairments:  Abnormal gait,Decreased balance,Pain,Postural dysfunction,Decreased coordination,Decreased activity tolerance,Improper body mechanics,Difficulty walking,Decreased mobility,Decreased endurance  Visit Diagnosis: Difficulty in walking, not elsewhere classified  Personal history of fall  Abnormal posture     Problem List Patient Active Problem List   Diagnosis Date Noted  . Pain due to onychomycosis of toenails of both feet 06/18/2020  . Rash of foot 01/30/2020  . Stage 3 chronic kidney disease ([place]) 10/23/2019  . Type 2 diabetes mellitus with diabetic neuropathy, unspecified ([place]) 10/21/2019  . Impaired gait and mobility 10/14/2019  . Generalized weakness   . Postural dizziness with presyncope 10/13/2019  . Ventricular premature complexes 10/13/2019  . Urge urinary incontinence 09/26/2019  . Depressed mood 09/26/2019  . Advanced care planning/counseling discussion 09/22/2018  . Weakness of both lower extremities 09/22/2018  . History of hepatitis 01/05/2018  . 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 NAFLD (nonalcoholic fatty liver disease) 09/30/2017  . Obesity, Class I, BMI 30.0-34.9 (see actual BMI) 09/18/2017  . Polyarthralgia 02/15/2017  . OSA (obstructive sleep apnea) 02/15/2017  . Partial epilepsy with impairment of consciousness ([place]) 01/29/2016  . Low vitamin B12 level  05/27/2015  . Elevated PSA 05/27/2015  . Vitamin D deficiency 03/02/2015  . Other long term (current) drug therapy 03/12/2012  . Health maintenance examination 04/26/2011  . TOTAL KNEE REPLACEMENT, LEFT, HX OF 04/07/2010  . Type 2 diabetes mellitus with diabetic nephropathy ([place]) 03/30/2010  . Hyperlipidemia associated with type 2 diabetes mellitus ([place]) 03/30/2010  . 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 Essential hypertension, benign 03/30/2010   Myles Gip PT, DPT [phone]  09/22/2020, 1:26 PM  [facility] [address] [place], [place], [address] Phone: [phone]   Fax:  [phone]  Name: Kirk Bennett. MRN: [id number] Date of Birth: [birth year]

## 2020-09-24 ENCOUNTER — Encounter: Payer: Self-pay | Admitting: Physical Therapy

## 2020-09-24 ENCOUNTER — Other Ambulatory Visit: Payer: Self-pay | Admitting: Family Medicine

## 2020-09-24 ENCOUNTER — Ambulatory Visit: Payer: 59 | Admitting: Physical Therapy

## 2020-09-24 ENCOUNTER — Other Ambulatory Visit: Payer: Self-pay

## 2020-09-24 VITALS — BP 139/66 | HR 72

## 2020-09-24 DIAGNOSIS — R262 Difficulty in walking, not elsewhere classified: Secondary | ICD-10-CM

## 2020-09-24 DIAGNOSIS — N1831 Chronic kidney disease, stage 3a: Secondary | ICD-10-CM

## 2020-09-24 DIAGNOSIS — R972 Elevated prostate specific antigen [PSA]: Secondary | ICD-10-CM

## 2020-09-24 DIAGNOSIS — E785 Hyperlipidemia, unspecified: Secondary | ICD-10-CM

## 2020-09-24 DIAGNOSIS — E1169 Type 2 diabetes mellitus with other specified complication: Secondary | ICD-10-CM

## 2020-09-24 DIAGNOSIS — Z9181 History of falling: Secondary | ICD-10-CM

## 2020-09-24 DIAGNOSIS — E538 Deficiency of other specified B group vitamins: Secondary | ICD-10-CM

## 2020-09-24 DIAGNOSIS — E1121 Type 2 diabetes mellitus with diabetic nephropathy: Secondary | ICD-10-CM

## 2020-09-24 DIAGNOSIS — R293 Abnormal posture: Secondary | ICD-10-CM

## 2020-09-24 DIAGNOSIS — E559 Vitamin D deficiency, unspecified: Secondary | ICD-10-CM

## 2020-09-24 NOTE — Therapy (Signed)
[facility] [address] [place], [place], [address] Phone: [phone]   Fax:  [phone]  Physical Therapy Treatment  Patient Details  Name: Kirk Bennett. MRN: [id number] Date of Birth: [birth year] Referring Provider (PT): Holley Raring, [place]   Encounter Date: 09/24/2020   PT End of Session - 09/24/20 1001    Visit Number 12    Number of Visits 21    Date for PT Re-Evaluation 10/20/20    PT Start Time 0947    PT Stop Time 1026    PT Time Calculation (min) 39 min    Equipment Utilized During Treatment Gait belt    Activity Tolerance Patient tolerated treatment well;Patient limited by fatigue    Behavior During Therapy WFL for tasks assessed/performed           Past Medical History:  Diagnosis Date  . Cataracts, bilateral 02/2011   and suspected glaucoma, to return for f/u, no diabetic retinopathy  . CKD stage 3 due to type 2 diabetes mellitus ([place]) 2015   normal renal US, self referred to Dr Holley Raring  . 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 Mora Bellman at [facility])  . Depression    found by neuro  . Elevated PSA 05/27/2015   Serial monitoring ([place])   . Glaucoma 2015   suspect  . History of hepatitis A   . HLD (hyperlipidemia)   . HTN (hypertension)   . Knee pain    s/p replacement  . SVT (supraventricular tachycardia) ([place]) 1996  . Vitamin D deficiency 03/02/2015  . 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 Gabriel Carina endo --> 02/2016 decided to return to PCP for DM care    Past Surgical History:  Procedure Laterality Date  . CARDIAC CATHETERIZATION  July 2010   No blockages (Dr. Liliane Shi)  . COLONOSCOPY  09/16/2005   hyperplastic polyps, rpt due 10 yrs   . COLONOSCOPY WITH PROPOFOL N/A 12/11/2015   mult polyps, few TA, rpt 51yrs ([place])  . corrective surgery  amblyopia  1957  . Cystectomy or meningioma removal brain  1990   (unclear)  . Left knee surgery  1971   Torn ACL  . REPLACEMENT TOTAL KNEE  April 2011   Left [facility] Dr. Garald Balding)    Vitals:   09/24/20 1002  BP: 139/66  Pulse: 72  SpO2: 100%     Subjective Assessment - 09/24/20 1000    Subjective Patient reports he doesn't feel quite right today. He denies any dizziness/lightheadness, nausea. Patient notes appointment with nephrology went well and no medications were adjusted. Patient denies increased joint pain.    Pertinent History Patient familiar to clinic as he was seen previously for BLE weakness. Patient achieved goals with last course of PT and was able to self-manage. He notes he has maintained his level of strength and is still able to stand from couch in family room/den without complaint.    Limitations Lifting;Standing;Walking;House hold activities    How long can you sit comfortably? unlimited    How long can you stand comfortably? 30 min (9/10 pain)    How long can you walk comfortably? 30 min (9/10 pain)    Patient Stated Goals "work the back out"           TREATMENT Therapeutic Exercise: NuStep, L1, seat 15, x10 min, SPM target 50-60  to assess activity tolerance today, patient monitored throughout for fatigue RTB BUE strengthening for improved ability to floor transfer:  Horizontal abduction, x15  Unilateral Rows, x15  Unilateral Flyes, x15  Flexion, x15  Abduction, x15  Circles, x15  Bicep curls, x15  Scap retraction with ER, x15  B Flyes, x15  B Flyes, x15  Patient educated throughout session on appropriate technique and form using multi-modal cueing, HEP, and activity modification.   Patient Response to interventions: Patient ambulating out of clinic with excellent form and large amplitude strides.  ASSESSMENT Patient presents to clinic with excellent motivation to participate in therapy despite feeling unwell/fatigued. Patient demonstrates deficits  in gait, posture, pain, and falls risk. Patient performed BUE and scapular strengthening with good form during today's session and patient responded positively to interventions. Patient will benefit from continued skilled therapeutic intervention to address remaining deficits in gait, posture, pain, and falls risk in order to increase function and improve overall QOL.   PT Long Term Goals - 09/22/20 1020      PT LONG TERM GOAL #1   Title Patient will be independent with HEP in order to improve strength and balance in order to decrease fall risk and improve function at home and work.    Baseline IE: none provided at this time; 1/27: IND    Time 6    Period Weeks    Status Achieved      PT LONG TERM GOAL #2   Title Patient will improve DGI by at least 3 points in order to demonstrate clinically significant improvement in balance and decreased risk for falls.    Baseline IE: to be assessed at next session; 12/23: 12; 2/1: 14    Time 4    Period Weeks    Status On-going    Target Date 10/20/20      PT LONG TERM GOAL #3   Title Patient will decrease 5TSTS by at least 3 seconds without UE support and from 17-18" seat height in order to demonstrate clinically significant improvement in LE strength..    Baseline IE: 18.5 sec from 23.5" seat; 1/27: 16.8 sec from 23.5 " seat; 2/1: 25.1 sec from 20" seat, BUE support    Time 4    Period Weeks    Status On-going    Target Date 10/20/20      PT LONG TERM GOAL #4   Title Patient will increase 10 meter walk test to >1.25m/s as to improve gait speed for better community ambulation and to reduce fall risk.    Baseline IE: 0.58m/s, SPC (self-selected); 2/1: 0.48m/s    Time 4    Period Weeks    Status On-going    Target Date 10/20/20      PT LONG TERM GOAL #5   Title Patient will demonstrate improved function as evidenced by a score of 65 on FOTO measure for full participation in activities at home and in the community.    Baseline IE: 69; 2/1: 61     Time 4    Period Weeks    Status On-going    Target Date 10/20/20                 Plan - 09/24/20 1002    Clinical Impression Statement Patient presents to clinic with excellent motivation to participate in therapy despite feeling unwell/fatigued. Patient demonstrates deficits in gait, posture, pain, and falls risk. Patient performed BUE and scapular strengthening with good form during today's session and patient responded  positively to interventions. Patient will benefit from continued skilled therapeutic intervention to address remaining deficits in gait, posture, pain, and falls risk in order to increase function and improve overall QOL.    Personal Factors and Comorbidities Comorbidity 3+;Fitness;Time since onset of injury/illness/exacerbation;Past/Current Experience    Comorbidities HTN, DM, HLD,    Examination-Activity Limitations Lift;Bend;Squat;Transfers;Stand;Stairs;Carry;Reach Overhead;Locomotion Level    Examination-Participation Restrictions Interpersonal Relationship;Yard Work;Laundry;Cleaning;Community Activity;Shop    Stability/Clinical Decision Making Evolving/Moderate complexity    Rehab Potential Fair    PT Frequency 2x / week    PT Duration 4 weeks    PT Treatment/Interventions ADLs/Self Care Home Management;Moist Heat;Electrical Stimulation;Cryotherapy;DME Instruction;Therapeutic activities;Gait training;Stair training;Therapeutic exercise;Balance training;Neuromuscular re-education;Functional mobility training;Energy conservation;Manual techniques;Patient/family education;Orthotic Fit/Training    PT Next Visit Plan large amplitude training    Consulted and Agree with Plan of Care Patient           Patient will benefit from skilled therapeutic intervention in order to improve the following deficits and impairments:  Abnormal gait,Decreased balance,Pain,Postural dysfunction,Decreased coordination,Decreased activity tolerance,Improper body mechanics,Difficulty  walking,Decreased mobility,Decreased endurance  Visit Diagnosis: Difficulty in walking, not elsewhere classified  Personal history of fall  Abnormal posture     Problem List Patient Active Problem List   Diagnosis Date Noted  . Pain due to onychomycosis of toenails of both feet 06/18/2020  . Rash of foot 01/30/2020  . Stage 3 chronic kidney disease ([place]) 10/23/2019  . Type 2 diabetes mellitus with diabetic neuropathy, unspecified ([place]) 10/21/2019  . Impaired gait and mobility 10/14/2019  . Generalized weakness   . Postural dizziness with presyncope 10/13/2019  . Ventricular premature complexes 10/13/2019  . Urge urinary incontinence 09/26/2019  . Depressed mood 09/26/2019  . Advanced care planning/counseling discussion 09/22/2018  . Weakness of both lower extremities 09/22/2018  . History of hepatitis 01/05/2018  . 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 NAFLD (nonalcoholic fatty liver disease) 09/30/2017  . Obesity, Class I, BMI 30.0-34.9 (see actual BMI) 09/18/2017  . Polyarthralgia 02/15/2017  . OSA (obstructive sleep apnea) 02/15/2017  . Partial epilepsy with impairment of consciousness ([place]) 01/29/2016  . Low vitamin B12 level 05/27/2015  . Elevated PSA 05/27/2015  . Vitamin D deficiency 03/02/2015  . Other long term (current) drug therapy 03/12/2012  . Health maintenance examination 04/26/2011  . TOTAL KNEE REPLACEMENT, LEFT, HX OF 04/07/2010  . Type 2 diabetes mellitus with diabetic nephropathy ([place]) 03/30/2010  . Hyperlipidemia associated with type 2 diabetes mellitus ([place]) 03/30/2010  . 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 Essential hypertension, benign 03/30/2010   Myles Gip PT, DPT [id number]  09/24/2020, 10:42 AM  [facility] [address] [place], [place], [address] Phone: [phone]   Fax:  [phone]  Name: Kirk Bennett. MRN:  [id number] Date of Birth: [birth year]

## 2020-09-25 ENCOUNTER — Other Ambulatory Visit (INDEPENDENT_AMBULATORY_CARE_PROVIDER_SITE_OTHER): Payer: 59

## 2020-09-25 DIAGNOSIS — E538 Deficiency of other specified B group vitamins: Secondary | ICD-10-CM

## 2020-09-25 DIAGNOSIS — E785 Hyperlipidemia, unspecified: Secondary | ICD-10-CM

## 2020-09-25 DIAGNOSIS — N1831 Chronic kidney disease, stage 3a: Secondary | ICD-10-CM

## 2020-09-25 DIAGNOSIS — E559 Vitamin D deficiency, unspecified: Secondary | ICD-10-CM

## 2020-09-25 DIAGNOSIS — E1169 Type 2 diabetes mellitus with other specified complication: Secondary | ICD-10-CM | POA: Diagnosis not present

## 2020-09-25 DIAGNOSIS — E1121 Type 2 diabetes mellitus with diabetic nephropathy: Secondary | ICD-10-CM

## 2020-09-25 DIAGNOSIS — R972 Elevated prostate specific antigen [PSA]: Secondary | ICD-10-CM

## 2020-09-25 NOTE — Addendum Note (Signed)
Addended by: Cloyd Stagers on: 09/25/2020 11:06 AM   Modules accepted: Orders

## 2020-09-26 LAB — CBC WITH DIFFERENTIAL/PLATELET
Absolute Monocytes: 378 cells/uL (ref 200–950)
Basophils Absolute: 41 cells/uL (ref 0–200)
Basophils Relative: 0.7 %
Eosinophils Absolute: 301 cells/uL (ref 15–500)
Eosinophils Relative: 5.1 %
HCT: 40.8 % (ref 38.5–50.0)
Hemoglobin: 13.5 g/dL (ref 13.2–17.1)
Lymphs Abs: 2065 cells/uL (ref 850–3900)
MCH: 28.2 pg (ref 27.0–33.0)
MCHC: 33.1 g/dL (ref 32.0–36.0)
MCV: 85.4 fL (ref 80.0–100.0)
MPV: 12.2 fL (ref 7.5–12.5)
Monocytes Relative: 6.4 %
Neutro Abs: 3115 cells/uL (ref 1500–7800)
Neutrophils Relative %: 52.8 %
Platelets: 202 10*3/uL (ref 140–400)
RBC: 4.78 10*6/uL (ref 4.20–5.80)
RDW: 11.4 % (ref 11.0–15.0)
Total Lymphocyte: 35 %
WBC: 5.9 10*3/uL (ref 3.8–10.8)

## 2020-09-26 LAB — COMPREHENSIVE METABOLIC PANEL
AG Ratio: 1.6 (calc) (ref 1.0–2.5)
ALT: 11 U/L (ref 9–46)
AST: 12 U/L (ref 10–35)
Albumin: 4.1 g/dL (ref 3.6–5.1)
Alkaline phosphatase (APISO): 99 U/L (ref 35–144)
BUN/Creatinine Ratio: 11 (calc) (ref 6–22)
BUN: 18 mg/dL (ref 7–25)
CO2: 24 mmol/L (ref 20–32)
Calcium: 9.3 mg/dL (ref 8.6–10.3)
Chloride: 104 mmol/L (ref 98–110)
Creat: 1.57 mg/dL — ABNORMAL HIGH (ref 0.70–1.25)
Globulin: 2.5 g/dL (calc) (ref 1.9–3.7)
Glucose, Bld: 260 mg/dL — ABNORMAL HIGH (ref 65–99)
Potassium: 4.6 mmol/L (ref 3.5–5.3)
Sodium: 138 mmol/L (ref 135–146)
Total Bilirubin: 1 mg/dL (ref 0.2–1.2)
Total Protein: 6.6 g/dL (ref 6.1–8.1)

## 2020-09-26 LAB — VITAMIN D 25 HYDROXY (VIT D DEFICIENCY, FRACTURES): Vit D, 25-Hydroxy: 34 ng/mL (ref 30–100)

## 2020-09-26 LAB — LIPID PANEL
Cholesterol: 126 mg/dL (ref <200)
HDL: 31 mg/dL — ABNORMAL LOW (ref >=40)
LDL Cholesterol (Calc): 77 mg/dL (calc)
Non-HDL Cholesterol (Calc): 95 mg/dL (calc) (ref <130)
Total CHOL/HDL Ratio: 4.1 (calc) (ref <5.0)
Triglycerides: 98 mg/dL (ref <150)

## 2020-09-26 LAB — HEMOGLOBIN A1C
Hgb A1c MFr Bld: 9.2 % of total Hgb — ABNORMAL HIGH (ref <5.7)
Mean Plasma Glucose: 217 mg/dL
eAG (mmol/L): 12 mmol/L

## 2020-09-26 LAB — PSA: PSA: 1.21 ng/mL (ref <=4.0)

## 2020-09-26 LAB — VITAMIN B12: Vitamin B-12: 407 pg/mL (ref 200–1100)

## 2020-09-28 ENCOUNTER — Encounter: Payer: Self-pay | Admitting: Physical Therapy

## 2020-09-28 ENCOUNTER — Other Ambulatory Visit: Payer: Self-pay | Admitting: Family Medicine

## 2020-09-28 NOTE — Telephone Encounter (Signed)
Pharmacy requests refill on: Atorvastatin 20 mg & Lisinopril 10 mg   LAST REFILL: 10/07/2019  LAST OV: 07/08/2020 NEXT OV: 10/02/2020 PHARMACY: [id number] [place], [place]

## 2020-10-01 ENCOUNTER — Encounter: Payer: Self-pay | Admitting: Physical Therapy

## 2020-10-01 ENCOUNTER — Ambulatory Visit: Payer: 59 | Admitting: Physical Therapy

## 2020-10-01 ENCOUNTER — Other Ambulatory Visit: Payer: Self-pay

## 2020-10-01 DIAGNOSIS — R262 Difficulty in walking, not elsewhere classified: Secondary | ICD-10-CM | POA: Diagnosis not present

## 2020-10-01 DIAGNOSIS — M6281 Muscle weakness (generalized): Secondary | ICD-10-CM

## 2020-10-01 DIAGNOSIS — Z9181 History of falling: Secondary | ICD-10-CM

## 2020-10-01 DIAGNOSIS — R293 Abnormal posture: Secondary | ICD-10-CM

## 2020-10-01 NOTE — Therapy (Signed)
[facility] [address] [place], [place], [address] Phone: [phone]   Fax:  [phone]  Physical Therapy Treatment  Patient Details  Name: Kirk Bennett. MRN: [id number] Date of Birth: 1950-10-06 Referring Provider (PT): Holley Raring, [place]   Encounter Date: 10/01/2020   PT End of Session - 10/01/20 1130    Visit Number 13    Number of Visits 21    Date for PT Re-Evaluation 10/20/20    PT Start Time 1000    PT Stop Time 1045    PT Time Calculation (min) 45 min    Equipment Utilized During Treatment Gait belt    Activity Tolerance Patient tolerated treatment well;Patient limited by fatigue    Behavior During Therapy WFL for tasks assessed/performed           Past Medical History:  Diagnosis Date  . Cataracts, bilateral 02/2011   and suspected glaucoma, to return for f/u, no diabetic retinopathy  . CKD stage 3 due to type 2 diabetes mellitus ([place]) 2015   normal renal US, self referred to Dr Holley Raring  . 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 Mora Bellman at [facility])  . Depression    found by neuro  . Elevated PSA 05/27/2015   Serial monitoring ([place])   . Glaucoma 2015   suspect  . History of hepatitis A   . HLD (hyperlipidemia)   . HTN (hypertension)   . Knee pain    s/p replacement  . SVT (supraventricular tachycardia) ([place]) 1996  . Vitamin D deficiency 03/02/2015  . 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 Gabriel Carina endo --> 02/2016 decided to return to PCP for DM care    Past Surgical History:  Procedure Laterality Date  . CARDIAC CATHETERIZATION  July 2010   No blockages (Dr. Liliane Shi)  . COLONOSCOPY  09/16/2005   hyperplastic polyps, rpt due 10 yrs   . COLONOSCOPY WITH PROPOFOL N/A 12/11/2015   mult polyps, few TA, rpt 86yrs ([place])  . corrective  surgery amblyopia  1957  . Cystectomy or meningioma removal brain  1990   (unclear)  . Left knee surgery  1971   Torn ACL  . REPLACEMENT TOTAL KNEE  April 2011   Left [facility] Dr. Garald Balding)    There were no vitals filed for this visit.   Subjective Assessment - 10/01/20 1129    Subjective Patient states that he is feeling well today. He notes his knees are not bad, but they continue to occasionally buckle without explanation. Patient denies any falls since last visit.    Pertinent History Patient familiar to clinic as he was seen previously for BLE weakness. Patient achieved goals with last course of PT and was able to self-manage. He notes he has maintained his level of strength and is still able to stand from couch in family room/den without complaint.    Limitations Lifting;Standing;Walking;House hold activities    How long can you sit comfortably? unlimited    How long can you stand comfortably? 30 min (9/10 pain)    How long can you walk comfortably? 30 min (9/10 pain)    Patient Stated Goals "work the back out"          TREATMENT Therapeutic Exercise: NuStep, L2-1, seat 15, x10 min, SPM target 70-80 for improved activity tolerance (during history and resources provided  per patient request for orthopedic providers in the area) Blue TB BUE strengthening for improved ability to floor transfer:  Unilateral Horizontal abduction, 2x15  Unilateral Rows, 2x15  Unilateral Flyes, 2x15   Neuromuscular Re-education: Gait in hallway with large amplitude strides for improved gait cycle, SPC, SBA Gait in hallway with obstacle course including: 4-6" obstacles and negotiating turns in small spaces, SPC, SBA  Patient educated throughout session on appropriate technique and form using multi-modal cueing, HEP, and activity modification.   Patient Response to interventions: Notes he's hit his max for the day.    ASSESSMENT Patient presents to clinic with excellent motivation to participate in  therapy. Patient demonstrates deficits in gait, posture, pain, and falls risk. Patient able to negotiate obstacle course and maintain gait mechanics with reasonable safety during today's session and  responded positively to neuromuscular re-education interventions. Patient will benefit from continued skilled therapeutic intervention to address remaining deficits in gait, posture, pain, and falls risk in order to increase function and improve overall QOL.     PT Long Term Goals - 09/22/20 1020      PT LONG TERM GOAL #1   Title Patient will be independent with HEP in order to improve strength and balance in order to decrease fall risk and improve function at home and work.    Baseline IE: none provided at this time; 1/27: IND    Time 6    Period Weeks    Status Achieved      PT LONG TERM GOAL #2   Title Patient will improve DGI by at least 3 points in order to demonstrate clinically significant improvement in balance and decreased risk for falls.    Baseline IE: to be assessed at next session; 12/23: 12; 2/1: 14    Time 4    Period Weeks    Status On-going    Target Date 10/20/20      PT LONG TERM GOAL #3   Title Patient will decrease 5TSTS by at least 3 seconds without UE support and from 17-18" seat height in order to demonstrate clinically significant improvement in LE strength..    Baseline IE: 18.5 sec from 23.5" seat; 1/27: 16.8 sec from 23.5 " seat; 2/1: 25.1 sec from 20" seat, BUE support    Time 4    Period Weeks    Status On-going    Target Date 10/20/20      PT LONG TERM GOAL #4   Title Patient will increase 10 meter walk test to >1.96m/s as to improve gait speed for better community ambulation and to reduce fall risk.    Baseline IE: 0.65m/s, SPC (self-selected); 2/1: 0.60m/s    Time 4    Period Weeks    Status On-going    Target Date 10/20/20      PT LONG TERM GOAL #5   Title Patient will demonstrate improved function as evidenced by a score of 65 on FOTO measure for  full participation in activities at home and in the community.    Baseline IE: 20; 2/1: 61    Time 4    Period Weeks    Status On-going    Target Date 10/20/20                 Plan - 10/01/20 1131    Clinical Impression Statement Patient presents to clinic with excellent motivation to participate in therapy. Patient demonstrates deficits in gait, posture, pain, and falls risk. Patient able to negotiate obstacle course and  maintain gait mechanics with reasonable safety during today's session and  responded positively to neuromuscular re-education interventions. Patient will benefit from continued skilled therapeutic intervention to address remaining deficits in gait, posture, pain, and falls risk in order to increase function and improve overall QOL.    Personal Factors and Comorbidities Comorbidity 3+;Fitness;Time since onset of injury/illness/exacerbation;Past/Current Experience    Comorbidities HTN, DM, HLD,    Examination-Activity Limitations Lift;Bend;Squat;Transfers;Stand;Stairs;Carry;Reach Overhead;Locomotion Level    Examination-Participation Restrictions Interpersonal Relationship;Yard Work;Laundry;Cleaning;Community Activity;Shop    Stability/Clinical Decision Making Evolving/Moderate complexity    Rehab Potential Fair    PT Frequency 2x / week    PT Duration 4 weeks    PT Treatment/Interventions ADLs/Self Care Home Management;Moist Heat;Electrical Stimulation;Cryotherapy;DME Instruction;Therapeutic activities;Gait training;Stair training;Therapeutic exercise;Balance training;Neuromuscular re-education;Functional mobility training;Energy conservation;Manual techniques;Patient/family education;Orthotic Fit/Training    PT Next Visit Plan large amplitude training    Consulted and Agree with Plan of Care Patient           Patient will benefit from skilled therapeutic intervention in order to improve the following deficits and impairments:  Abnormal gait,Decreased  balance,Pain,Postural dysfunction,Decreased coordination,Decreased activity tolerance,Improper body mechanics,Difficulty walking,Decreased mobility,Decreased endurance  Visit Diagnosis: Difficulty in walking, not elsewhere classified  Abnormal posture  Muscle weakness (generalized)  Personal history of fall     Problem List Patient Active Problem List   Diagnosis Date Noted  . Pain due to onychomycosis of toenails of both feet 06/18/2020  . Rash of foot 01/30/2020  . Stage 3 chronic kidney disease ([place]) 10/23/2019  . Type 2 diabetes mellitus with diabetic neuropathy, unspecified ([place]) 10/21/2019  . Impaired gait and mobility 10/14/2019  . Generalized weakness   . Postural dizziness with presyncope 10/13/2019  . Ventricular premature complexes 10/13/2019  . Urge urinary incontinence 09/26/2019  . Depressed mood 09/26/2019  . Advanced care planning/counseling discussion 09/22/2018  . Weakness of both lower extremities 09/22/2018  . History of hepatitis 01/05/2018  . 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 NAFLD (nonalcoholic fatty liver disease) 09/30/2017  . Obesity, Class I, BMI 30.0-34.9 (see actual BMI) 09/18/2017  . Polyarthralgia 02/15/2017  . OSA (obstructive sleep apnea) 02/15/2017  . Partial epilepsy with impairment of consciousness ([place]) 01/29/2016  . Low vitamin B12 level 05/27/2015  . Elevated PSA 05/27/2015  . Vitamin D deficiency 03/02/2015  . Other long term (current) drug therapy 03/12/2012  . Health maintenance examination 04/26/2011  . TOTAL KNEE REPLACEMENT, LEFT, HX OF 04/07/2010  . Type 2 diabetes mellitus with diabetic nephropathy ([place]) 03/30/2010  . Hyperlipidemia associated with type 2 diabetes mellitus ([place]) 03/30/2010  . 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 Essential hypertension, benign 03/30/2010   Myles Gip PT, DPT [phone] 10/01/2020, 11:48 AM  [facility] [address] [place], [place], [address] Phone: [phone]   Fax:  [phone]  Name: Kirk Bennett. MRN: [id number] Date of Birth: Oct 03, 1950

## 2020-10-02 ENCOUNTER — Encounter: Payer: Self-pay | Admitting: Family Medicine

## 2020-10-02 ENCOUNTER — Ambulatory Visit (INDEPENDENT_AMBULATORY_CARE_PROVIDER_SITE_OTHER): Payer: 59 | Admitting: Family Medicine

## 2020-10-02 ENCOUNTER — Other Ambulatory Visit: Payer: Self-pay

## 2020-10-02 VITALS — BP 140/70 | HR 110 | Temp 97.2°F | Ht 74.0 in | Wt 245.2 lb

## 2020-10-02 DIAGNOSIS — Z0001 Encounter for general adult medical examination with abnormal findings: Secondary | ICD-10-CM

## 2020-10-02 DIAGNOSIS — R4589 Other symptoms and signs involving emotional state: Secondary | ICD-10-CM

## 2020-10-02 DIAGNOSIS — N401 Enlarged prostate with lower urinary tract symptoms: Secondary | ICD-10-CM | POA: Diagnosis not present

## 2020-10-02 DIAGNOSIS — E785 Hyperlipidemia, unspecified: Secondary | ICD-10-CM

## 2020-10-02 DIAGNOSIS — E1121 Type 2 diabetes mellitus with diabetic nephropathy: Secondary | ICD-10-CM

## 2020-10-02 DIAGNOSIS — R42 Dizziness and giddiness: Secondary | ICD-10-CM

## 2020-10-02 DIAGNOSIS — N1831 Chronic kidney disease, stage 3a: Secondary | ICD-10-CM

## 2020-10-02 DIAGNOSIS — R55 Syncope and collapse: Secondary | ICD-10-CM

## 2020-10-02 DIAGNOSIS — I10 Essential (primary) hypertension: Secondary | ICD-10-CM

## 2020-10-02 DIAGNOSIS — E559 Vitamin D deficiency, unspecified: Secondary | ICD-10-CM

## 2020-10-02 DIAGNOSIS — E1169 Type 2 diabetes mellitus with other specified complication: Secondary | ICD-10-CM | POA: Diagnosis not present

## 2020-10-02 DIAGNOSIS — E538 Deficiency of other specified B group vitamins: Secondary | ICD-10-CM

## 2020-10-02 DIAGNOSIS — N3941 Urge incontinence: Secondary | ICD-10-CM

## 2020-10-02 DIAGNOSIS — R972 Elevated prostate specific antigen [PSA]: Secondary | ICD-10-CM

## 2020-10-02 DIAGNOSIS — Z1211 Encounter for screening for malignant neoplasm of colon: Secondary | ICD-10-CM

## 2020-10-02 DIAGNOSIS — I493 Ventricular premature depolarization: Secondary | ICD-10-CM

## 2020-10-02 DIAGNOSIS — R29898 Other symptoms and signs involving the musculoskeletal system: Secondary | ICD-10-CM

## 2020-10-02 DIAGNOSIS — R2689 Other abnormalities of gait and mobility: Secondary | ICD-10-CM

## 2020-10-02 DIAGNOSIS — R350 Frequency of micturition: Secondary | ICD-10-CM

## 2020-10-02 DIAGNOSIS — G40109 Localization-related (focal) (partial) symptomatic epilepsy and epileptic syndromes with simple partial seizures, not intractable, without status epilepticus: Secondary | ICD-10-CM

## 2020-10-02 DIAGNOSIS — E1140 Type 2 diabetes mellitus with diabetic neuropathy, unspecified: Secondary | ICD-10-CM

## 2020-10-02 MED ORDER — DAPAGLIFLOZIN PROPANEDIOL 5 MG PO TABS: 5.0000 mg | ORAL_TABLET | Freq: Every day | ORAL | 6 refills | Status: DC

## 2020-10-02 NOTE — Patient Instructions (Addendum)
Stop oxybutynin.  Urinalysis today.  Pass by lab to pick up stool kit.  If interested, check with pharmacy about new 2 shot shingles series (shingrix).  Given sugar elevation, restart farxiga 70m daily.  Return in 3 months for diabetes check.  Keep me updated with evaluation by movement specialist.   Health Maintenance After Age 613After age 68 you are at a higher risk for certain long-term diseases and infections as well as injuries from falls. Falls are a major cause of broken bones and head injuries in people who are older than age 68 Getting regular preventive care can help to keep you healthy and well. Preventive care includes getting regular testing and making lifestyle changes as recommended by your health care provider. Talk with your health care provider about:  Which screenings and tests you should have. A screening is a test that checks for a disease when you have no symptoms.  A diet and exercise plan that is right for you. What should I know about screenings and tests to prevent falls? Screening and testing are the best ways to find a health problem early. Early diagnosis and treatment give you the best chance of managing medical conditions that are common after age 68 Certain conditions and lifestyle choices may make you more likely to have a fall. Your health care provider may recommend:  Regular vision checks. Poor vision and conditions such as cataracts can make you more likely to have a fall. If you wear glasses, make sure to get your prescription updated if your vision changes.  Medicine review. Work with your health care provider to regularly review all of the medicines you are taking, including over-the-counter medicines. Ask your health care provider about any side effects that may make you more likely to have a fall. Tell your health care provider if any medicines that you take make you feel dizzy or sleepy.  Osteoporosis screening. Osteoporosis is a condition that  causes the bones to get weaker. This can make the bones weak and cause them to break more easily.  Blood pressure screening. Blood pressure changes and medicines to control blood pressure can make you feel dizzy.  Strength and balance checks. Your health care provider may recommend certain tests to check your strength and balance while standing, walking, or changing positions.  Foot health exam. Foot pain and numbness, as well as not wearing proper footwear, can make you more likely to have a fall.  Depression screening. You may be more likely to have a fall if you have a fear of falling, feel emotionally low, or feel unable to do activities that you used to do.  Alcohol use screening. Using too much alcohol can affect your balance and may make you more likely to have a fall. What actions can I take to lower my risk of falls? General instructions  Talk with your health care provider about your risks for falling. Tell your health care provider if: ? You fall. Be sure to tell your health care provider about all falls, even ones that seem minor. ? You feel dizzy, sleepy, or off-balance.  Take over-the-counter and prescription medicines only as told by your health care provider. These include any supplements.  Eat a healthy diet and maintain a healthy weight. A healthy diet includes low-fat dairy products, low-fat (lean) meats, and fiber from whole grains, beans, and lots of fruits and vegetables. Home safety  Remove any tripping hazards, such as rugs, cords, and clutter.  Install safety equipment such  as grab bars in bathrooms and safety rails on stairs.  Keep rooms and walkways well-lit. Activity  Follow a regular exercise program to stay fit. This will help you maintain your balance. Ask your health care provider what types of exercise are appropriate for you.  If you need a cane or walker, use it as recommended by your health care provider.  Wear supportive shoes that have nonskid  soles.   Lifestyle  Do not drink alcohol if your health care provider tells you not to drink.  If you drink alcohol, limit how much you have: ? 0-1 drink a day for women. ? 0-2 drinks a day for men.  Be aware of how much alcohol is in your drink. In the [place] one drink equals one typical bottle of beer (12 oz), one-half glass of wine (5 oz), or one shot of hard liquor (1 oz).  Do not use any products that contain nicotine or tobacco, such as cigarettes and e-cigarettes. If you need help quitting, ask your health care provider. Summary  Having a healthy lifestyle and getting preventive care can help to protect your health and wellness after age 56.  Screening and testing are the best way to find a health problem early and help you avoid having a fall. Early diagnosis and treatment give you the best chance for managing medical conditions that are more common for people who are older than age 12.  Falls are a major cause of broken bones and head injuries in people who are older than age 84. Take precautions to prevent a fall at home.  Work with your health care provider to learn what changes you can make to improve your health and wellness and to prevent falls. This information is not intended to replace advice given to you by your health care provider. Make sure you discuss any questions you have with your health care provider. Document Revised: 11/29/2018 Document Reviewed: 06/21/2017 Elsevier Patient Education  2021 [employer].

## 2020-10-02 NOTE — Progress Notes (Signed)
Patient ID: Kirk Bennett., male    DOB: 01-10-1951, 70 y.o.   MRN: [id number]  This visit was conducted in person.  BP 140/70   Pulse (!) 110   Temp (!) 97.2 F (36.2 C) (Temporal)   Ht _0  (1.88 m)   Wt 245 lb 4 oz (111.2 kg)   SpO2 95%   BMI 31.49 kg/m   BP Readings from Last 3 Encounters:  10/02/20 140/70  09/24/20 139/66  07/08/20 130/76    Pulse Readings from Last 3 Encounters:  10/02/20 (!) 110  09/24/20 72  07/08/20 (!) 103    CC: CPE Subjective:   HPI: Kirk Bennett. is a 70 y.o. male presenting on 10/02/2020 for Annual Exam (Has concerns he will speak about )   Has Medicare part A.   Overactive bladder with urgency and urge incontinence - oxybutynin started late 2021 helped symptoms. Now he doesn't think this is really helping and desires to stop.   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 Planned brain MRI and referral to movement specialist. [facility] outpatient PT.  Increasing falls - latest 1 week ago - slipped outside on uneven surface. Has had about 3 falls this past month.   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  Preventative: COLONOSCOPY WITH PROPOFOL 12/11/2015 mult polyps, few TA, rpt 2yr ([place]) - postponed due to pandemic. Due for this - postpone at this time. Will do stool kit this year.  Prostate cancer screening -PSA increased to 4.4 last year, referred to urology with stable DRE and decreased PSA back to normal range in interim. Rec yearly monitoringatPCP's office. Flu shot yearly COVID vaccine Moderna 08/2019, 09/2019, booster 06/2020 Tdap -04/2011  Pneumovax 2013, prevnar 04/2018  Had hepatitis at age 10yo(?A), hospitalized [facility], [place].  No hep B in past. Shingrix - interested - will await completing covid vaccine.   Advanced directive: has this at home. Wife is HCPOA. Asked to bring uKoreacopy.  Seat belt use discussed. Sunscreen use discussed. R groin mole enlarging.  Non smoker Alcohol - none  Dentist q6 mo  Eye exam yearly  Bowel - no constipation Bladder - ongoing urge incontinence noted in the last year - uses depends. Occasional full uncontrollable emptying. No stress incontinence symptoms  Lives with wife and granddaughter, 1 cat Occupation: retired, worked air traffic control  Activity:uses stationary bicycle at home  Diet: good water, good vegetables, follows diabetic diet     Relevant past medical, surgical, family and social history reviewed and updated as indicated. Interim medical history since our last visit reviewed. Allergies and medications reviewed and updated. Outpatient Medications Prior to Visit  Medication Sig Dispense Refill  . atorvastatin (LIPITOR) 20 MG tablet TAKE 1 TABLET BY MOUTH EVERY DAY 90 tablet 3  . Cholecalciferol (VITAMIN D3) 1000 units CAPS Take 1,000 Units by mouth daily.    . clotrimazole (LOTRIMIN AF) 1 % cream Apply 1 application topically 2 (two) times daily. 113 g 0  . glimepiride (AMARYL) 4 MG tablet TAKE 1 TABLET BY MOUTH DAILY WITH BREAKFAST 90 tablet 0  . glucose blood (ONE TOUCH ULTRA TEST) test strip Use to check sugar fasting in the AM and 2 hours after lunch or supper. Dx: E11.21 100 each 3  . lisinopril (ZESTRIL) 10 MG tablet TAKE 1 TABLET BY  MOUTH TWICE A DAY 180 tablet 3  . metFORMIN (GLUCOPHAGE) 1000 MG tablet TAKE 1 TABLET BY MOUTH TWICE A DAY WITH MEALS 180 tablet 1  . levETIRAcetam (KEPPRA) 500 MG tablet Take by mouth.    . oxybutynin (DITROPAN) 5 MG tablet TAKE 1 TABLET BY MOUTH EVERY DAY 90 tablet 1  . levETIRAcetam (KEPPRA) 500 MG tablet Take 2 tablets (1,000 mg total) by mouth in the morning AND 3 tablets (1,500 mg total) every evening.     No facility-administered medications prior to visit.     Per HPI unless specifically  indicated in ROS section below Review of Systems  Constitutional: Negative for activity change, appetite change, chills, fatigue, fever and unexpected weight change.  HENT: Negative for hearing loss.   Eyes: Negative for visual disturbance.  Respiratory: Negative for cough, chest tightness, shortness of breath and wheezing.   Cardiovascular: Negative for chest pain, palpitations and leg swelling.  Gastrointestinal: Negative for abdominal distention, abdominal pain, blood in stool, constipation, diarrhea, nausea and vomiting.  Genitourinary: Negative for difficulty urinating and hematuria.  Musculoskeletal: Negative for arthralgias, myalgias and neck pain.  Skin: Negative for rash.  Neurological: Negative for dizziness, seizures, syncope and headaches.  Hematological: Negative for adenopathy. Does not bruise/bleed easily.  Psychiatric/Behavioral: Negative for dysphoric mood. The patient is not nervous/anxious.    Objective:  BP 140/70   Pulse (!) 110   Temp (!) 97.2 F (36.2 C) (Temporal)   Ht $R'6\' 2"'OE$  (1.88 m)   Wt 245 lb 4 oz (111.2 kg)   SpO2 95%   BMI 31.49 kg/m   Wt Readings from Last 3 Encounters:  10/02/20 245 lb 4 oz (111.2 kg)  07/08/20 248 lb 4 oz (112.6 kg)  05/13/20 250 lb (113.4 kg)      Physical Exam Vitals and nursing note reviewed.  Constitutional:      General: He is not in acute distress.    Appearance: Normal appearance. He is well-developed and well-nourished. He is not ill-appearing.     Comments: Unsteady ambulation despite using cane  HENT:     Head: Normocephalic and atraumatic.     Right Ear: Hearing, tympanic membrane, ear canal and external ear normal.     Left Ear: Hearing, tympanic membrane, ear canal and external ear normal.     Mouth/Throat:     Mouth: Oropharynx is clear and moist and mucous membranes are normal.     Pharynx: Uvula midline. No posterior oropharyngeal edema.  Eyes:     General: No scleral icterus.    Extraocular Movements:  Extraocular movements intact and EOM normal.     Conjunctiva/sclera: Conjunctivae normal.     Pupils: Pupils are equal, round, and reactive to light.  Neck:     Thyroid: No thyroid mass or thyromegaly.     Vascular: No carotid bruit.  Cardiovascular:     Rate and Rhythm: Normal rate and regular rhythm.     Pulses: Normal pulses and intact distal pulses.          Radial pulses are 2+ on the right side and 2+ on the left side.     Heart sounds: Normal heart sounds. No murmur heard.   Pulmonary:     Effort: Pulmonary effort is normal. No respiratory distress.     Breath sounds: Normal breath sounds. No wheezing, rhonchi or rales.  Abdominal:     General: Abdomen is protuberant. Bowel sounds are normal.     Palpations: Abdomen is soft. There is  no mass.     Tenderness: There is no abdominal tenderness. There is no guarding or rebound. Negative signs include Murphy's sign.     Hernia: No hernia is present.  Genitourinary:    Prostate: Enlarged (40gm) and tender. No nodules present.     Rectum: Normal. No mass, tenderness, anal fissure, external hemorrhoid or internal hemorrhoid. Normal anal tone.  Musculoskeletal:        General: No edema. Normal range of motion.     Cervical back: Normal range of motion and neck supple.     Right lower leg: No edema.     Left lower leg: No edema.  Lymphadenopathy:     Cervical: No cervical adenopathy.  Skin:    General: Skin is warm and dry.     Findings: No rash.  Neurological:     General: No focal deficit present.     Mental Status: He is alert and oriented to person, place, and time.     Deep Tendon Reflexes:     Reflex Scores:      Bicep reflexes are 2+ on the right side and 2+ on the left side.      Patellar reflexes are 1+ on the right side and 1+ on the left side.    Comments:  Diminished DTRs BLE 5/5 strength BLE Marked unsteady shuffling gait with small steps, forward flexed trunk  No significant cogwheel rigidity Mild masked  facies   Psychiatric:        Mood and Affect: Mood and affect normal.        Behavior: Behavior normal.        Thought Content: Thought content normal.        Judgment: Judgment normal.       Results for orders placed or performed in visit on 09/25/20  Lipid panel  Result Value Ref Range   Cholesterol 126 <200 mg/dL   HDL 31 (L) > OR = 40 mg/dL   Triglycerides 98 <150 mg/dL   LDL Cholesterol (Calc) 77 mg/dL (calc)   Total CHOL/HDL Ratio 4.1 <5.0 (calc)   Non-HDL Cholesterol (Calc) 95 <130 mg/dL (calc)  Comprehensive metabolic panel  Result Value Ref Range   Glucose, Bld 260 (H) 65 - 99 mg/dL   BUN 18 7 - 25 mg/dL   Creat 1.57 (H) 0.70 - 1.25 mg/dL   BUN/Creatinine Ratio 11 6 - 22 (calc)   Sodium 138 135 - 146 mmol/L   Potassium 4.6 3.5 - 5.3 mmol/L   Chloride 104 98 - 110 mmol/L   CO2 24 20 - 32 mmol/L   Calcium 9.3 8.6 - 10.3 mg/dL   Total Protein 6.6 6.1 - 8.1 g/dL   Albumin 4.1 3.6 - 5.1 g/dL   Globulin 2.5 1.9 - 3.7 g/dL (calc)   AG Ratio 1.6 1.0 - 2.5 (calc)   Total Bilirubin 1.0 0.2 - 1.2 mg/dL   Alkaline phosphatase (APISO) 99 35 - 144 U/L   AST 12 10 - 35 U/L   ALT 11 9 - 46 U/L  Hemoglobin A1c  Result Value Ref Range   Hgb A1c MFr Bld 9.2 (H) <5.7 % of total Hgb   Mean Plasma Glucose 217 mg/dL   eAG (mmol/L) 12.0 mmol/L  PSA  Result Value Ref Range   PSA 1.21 < OR = 4.0 ng/mL  CBC with Differential/Platelet  Result Value Ref Range   WBC 5.9 3.8 - 10.8 Thousand/uL   RBC 4.78 4.20 - 5.80 Million/uL   Hemoglobin  13.5 13.2 - 17.1 g/dL   HCT 40.8 38.5 - 50.0 %   MCV 85.4 80.0 - 100.0 fL   MCH 28.2 27.0 - 33.0 pg   MCHC 33.1 32.0 - 36.0 g/dL   RDW 11.4 11.0 - 15.0 %   Platelets 202 140 - 400 Thousand/uL   MPV 12.2 7.5 - 12.5 fL   Neutro Abs 3,115 1,500 - 7,800 cells/uL   Lymphs Abs 2,065 850 - 3,900 cells/uL   Absolute Monocytes 378 200 - 950 cells/uL   Eosinophils Absolute 301 15 - 500 cells/uL   Basophils Absolute 41 0 - 200 cells/uL   Neutrophils  Relative % 52.8 %   Total Lymphocyte 35.0 %   Monocytes Relative 6.4 %   Eosinophils Relative 5.1 %   Basophils Relative 0.7 %  Vitamin B12  Result Value Ref Range   Vitamin B-12 407 200 - 1,100 pg/mL  VITAMIN D 25 Hydroxy (Vit-D Deficiency, Fractures)  Result Value Ref Range   Vit D, 25-Hydroxy 34 30 - 100 ng/mL   Assessment & Plan:  This visit occurred during the SARS-CoV-2 public health emergency.  Safety protocols were in place, including screening questions prior to the visit, additional usage of staff PPE, and extensive cleaning of exam room while observing appropriate contact time as indicated for disinfecting solutions.   Problem List Items Addressed This Visit    Weakness of both lower extremities    Strength preserved. Continue outpatient PT.       Vitamin D deficiency    Stable period on 1000 IU replacement.       Ventricular premature complexes    Tachycardic and irregular with h/o PAC/PVCs - declines rpt EKG today. Not irregularly irregular.       Urge urinary incontinence    Ongoing, needs to use depends.  Saw urology (mod-severe OAB), tried and failed multiple medications including flomax, myrbetriq, oxybutynin (with partial effect).  Now concern for other neurodegenerative process - will stop oxybutynin to limit anticholinergic effect, await movement disorder specialist evaluation.       Type 2 diabetes mellitus with diabetic nephropathy (HCC)    Chronic, glycemic control has deteriorated off farxiga (stopped mid last year due to worsening urinary urge/incontinence). As urinary symptoms no better off farxiga will restart at 28m daily. RTC 3 mo DM f/u visit. Declines injectable medication.       Relevant Medications   dapagliflozin propanediol (FARXIGA) 5 MG TABS tablet   Type 2 diabetes mellitus with diabetic neuropathy, unspecified ([place]    NCS suspicious for diabetic polyneuropathy.       Relevant Medications   dapagliflozin propanediol (FARXIGA) 5 MG  TABS tablet   Stage 3 chronic kidney disease (HCC)    Chronic, stable.  Restart farxiga due to deteriorated glycemic control.  eGFR = 53.       Postural dizziness with presyncope    Stop oxybutynin      Low vitamin B12 level    Levels stable off replacement.       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       Relevant Medications   levETIRAcetam (KEPPRA) 500 MG tablet   Impaired gait and mobility    Marked change in gait over the past 6 months.  Recent reassuring thoracic and cervical MRI.  Upcoming brain MRI and evaluation by movement disorder specialist.  Concern for parkinsonism. No significant tremor or cognitive impairment/memory disturbance.  Hyperlipidemia associated with type 2 diabetes mellitus (HCC)    Chronic, stable on atorvastatin 79m daily with LDL 77.  The ASCVD Risk score (Mikey BussingDC Jr., et al., 2013) failed to calculate for the following reasons:   The valid total cholesterol range is 130 to 320 mg/dL       Relevant Medications   dapagliflozin propanediol (FARXIGA) 5 MG TABS tablet   Essential hypertension, benign    Chronic, adequate.       Encounter for general adult medical examination with abnormal findings - Primary    Preventative protocols reviewed and updated unless pt declined. Discussed healthy diet and lifestyle.  Overdue for colon cancer screening - will defer colonoscopy at this time pending completing neurological workup, but agrees to iFOB.       Elevated PSA    PSA reassuring. See below re BPH. Consider continue yearly PSA only.       Depressed mood    Situational due to decreased function over the past 6 months.       BPH (benign prostatic hyperplasia)    DRE with BPH, tender. Check UA - he was unable to provide sample, will return with this.        Other Visit Diagnoses    Special screening for malignant neoplasms, colon       Relevant  Orders   Fecal occult blood, imunochemical       Meds ordered this encounter  Medications  . dapagliflozin propanediol (FARXIGA) 5 MG TABS tablet    Sig: Take 1 tablet (5 mg total) by mouth daily before breakfast.    Dispense:  30 tablet    Refill:  6   Orders Placed This Encounter  Procedures  . Fecal occult blood, imunochemical    Standing Status:   Future    Standing Expiration Date:   09/21/2021    Patient instructions: Stop oxybutynin.  Urinalysis today.  Pass by lab to pick up stool kit.  If interested, check with pharmacy about new 2 shot shingles series (shingrix).  Given sugar elevation, restart farxiga 59mdaily.  Return in 3 months for diabetes check.  Keep me updated with evaluation by movement specialist.   Follow up plan: Return in about 3 months (around 12/30/2020) for follow up visit.  JaRia BushMD

## 2020-10-03 ENCOUNTER — Encounter: Payer: Self-pay | Admitting: Family Medicine

## 2020-10-03 DIAGNOSIS — N400 Enlarged prostate without lower urinary tract symptoms: Secondary | ICD-10-CM | POA: Insufficient documentation

## 2020-10-03 NOTE — Assessment & Plan Note (Addendum)
Chronic, glycemic control has deteriorated off farxiga (stopped mid last year due to worsening urinary urge/incontinence). As urinary symptoms no better off farxiga will restart at 5mg  daily. RTC 3 mo DM f/u visit. Declines injectable medication.

## 2020-10-03 NOTE — Assessment & Plan Note (Signed)
Ongoing, needs to use depends.  Saw urology (mod-severe OAB), tried and failed multiple medications including flomax, myrbetriq, oxybutynin (with partial effect).  Now concern for other neurodegenerative process - will stop oxybutynin to limit anticholinergic effect, await movement disorder specialist evaluation.

## 2020-10-03 NOTE — Assessment & Plan Note (Signed)
Strength preserved. Continue outpatient PT.

## 2020-10-03 NOTE — Assessment & Plan Note (Signed)
Followed by neuro on keppra 1000/1500mg  daily.

## 2020-10-03 NOTE — Assessment & Plan Note (Signed)
Chronic, adequate °

## 2020-10-03 NOTE — Assessment & Plan Note (Signed)
Marked change in gait over the past 6 months.  Recent reassuring thoracic and cervical MRI.  Upcoming brain MRI and evaluation by movement disorder specialist.  Concern for parkinsonism. No significant tremor or cognitive impairment/memory disturbance.

## 2020-10-03 NOTE — Assessment & Plan Note (Signed)
DRE with BPH, tender. Check UA - he was unable to provide sample, will return with this.

## 2020-10-03 NOTE — Assessment & Plan Note (Addendum)
Chronic, stable.  Restart farxiga due to deteriorated glycemic control.  eGFR = 53.

## 2020-10-03 NOTE — Assessment & Plan Note (Signed)
Chronic, stable on atorvastatin 20mg  daily with LDL 77.  The ASCVD Risk score Mikey Bussing DC Jr., et al., 2013) failed to calculate for the following reasons:   The valid total cholesterol range is 130 to 320 mg/dL

## 2020-10-03 NOTE — Assessment & Plan Note (Addendum)
Preventative protocols reviewed and updated unless pt declined. Discussed healthy diet and lifestyle.  Overdue for colon cancer screening - will defer colonoscopy at this time pending completing neurological workup, but agrees to iFOB.

## 2020-10-03 NOTE — Assessment & Plan Note (Signed)
Levels stable off replacement.  

## 2020-10-03 NOTE — Assessment & Plan Note (Addendum)
Tachycardic and irregular with h/o PAC/PVCs - declines rpt EKG today. Not irregularly irregular.

## 2020-10-03 NOTE — Assessment & Plan Note (Signed)
Stable period on 1000 IU replacement.

## 2020-10-03 NOTE — Assessment & Plan Note (Addendum)
PSA reassuring. See below re BPH. Consider continue yearly PSA only.

## 2020-10-03 NOTE — Assessment & Plan Note (Signed)
NCS suspicious for diabetic polyneuropathy.

## 2020-10-03 NOTE — Assessment & Plan Note (Signed)
Situational due to decreased function over the past 6 months.

## 2020-10-03 NOTE — Assessment & Plan Note (Signed)
Stop oxybutynin

## 2020-10-05 ENCOUNTER — Other Ambulatory Visit: Payer: Self-pay

## 2020-10-05 ENCOUNTER — Ambulatory Visit: Payer: 59 | Admitting: Physical Therapy

## 2020-10-05 ENCOUNTER — Encounter: Payer: Self-pay | Admitting: Physical Therapy

## 2020-10-05 DIAGNOSIS — R262 Difficulty in walking, not elsewhere classified: Secondary | ICD-10-CM | POA: Diagnosis not present

## 2020-10-05 DIAGNOSIS — R293 Abnormal posture: Secondary | ICD-10-CM

## 2020-10-05 DIAGNOSIS — M6281 Muscle weakness (generalized): Secondary | ICD-10-CM

## 2020-10-05 DIAGNOSIS — Z9181 History of falling: Secondary | ICD-10-CM

## 2020-10-05 NOTE — Therapy (Signed)
[facility] [address] [place], [place], [address] Phone: [phone]   Fax:  [phone]  Physical Therapy Treatment  Patient Details  Name: Kirk Bennett. MRN: [id number] Date of Birth: 30-Jun-1951 Referring Provider (PT): Holley Raring, [place]   Encounter Date: 10/05/2020   PT End of Session - 10/05/20 1239    Visit Number 14    Number of Visits 21    Date for PT Re-Evaluation 10/20/20    PT Start Time 1000    PT Stop Time 1045    PT Time Calculation (min) 45 min    Equipment Utilized During Treatment Gait belt    Activity Tolerance Patient tolerated treatment well;Patient limited by fatigue    Behavior During Therapy WFL for tasks assessed/performed           Past Medical History:  Diagnosis Date  . Cataracts, bilateral 02/2011   and suspected glaucoma, to return for f/u, no diabetic retinopathy  . CKD stage 3 due to type 2 diabetes mellitus ([place]) 2015   normal renal US, self referred to Dr Holley Raring  . 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 Mora Bellman at [facility])  . Depression    found by neuro  . Elevated PSA 05/27/2015   Serial monitoring ([place])   . Glaucoma 2015   suspect  . History of hepatitis A   . HLD (hyperlipidemia)   . HTN (hypertension)   . Knee pain    s/p replacement  . SVT (supraventricular tachycardia) ([place]) 1996  . Vitamin D deficiency 03/02/2015  . 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 Gabriel Carina endo --> 02/2016 decided to return to PCP for DM care    Past Surgical History:  Procedure Laterality Date  . CARDIAC CATHETERIZATION  July 2010   No blockages (Dr. Liliane Shi)  . COLONOSCOPY  09/16/2005   hyperplastic polyps, rpt due 10 yrs   . COLONOSCOPY WITH PROPOFOL N/A 12/11/2015   mult polyps, few TA, rpt 22yrs ([place])  . corrective  surgery amblyopia  1957  . Cystectomy or meningioma removal brain  1990   (unclear)  . Left knee surgery  1971   Torn ACL  . REPLACEMENT TOTAL KNEE  April 2011   Left [facility] Dr. Garald Balding)    There were no vitals filed for this visit.   Subjective Assessment - 10/05/20 1007    Subjective Patient states that his right hand is feeling sore in the thenar eminence limiting his ability to hold his cane. Patient notes that otherwise he is feeling fine.    Pertinent History Patient familiar to clinic as he was seen previously for BLE weakness. Patient achieved goals with last course of PT and was able to self-manage. He notes he has maintained his level of strength and is still able to stand from couch in family room/den without complaint.    Limitations Lifting;Standing;Walking;House hold activities    How long can you sit comfortably? unlimited    How long can you stand comfortably? 30 min (9/10 pain)    How long can you walk comfortably? 30 min (9/10 pain)    Patient Stated Goals "work the back out"    Currently in Pain? Yes    Pain Score 6     Pain Location Hand    Pain Orientation Right    Pain Descriptors /  Indicators Aching;Sore           TREATMENT Therapeutic Exercise: NuStep, L2-1, seat 15, x10 min, SPM target 70-80 for improved activity tolerance (with assessment of R thumb and hand, tenderness to palpation of thenar eminence and limited grip strength compared to L)  4# CW LE strengthening:  Hip flexor marches, 2x15  LAQ, 2x15  Neuromuscular Re-education: Gait in // bars with obstacle negotiation in small space, emphasis on "clock turns" with feet for improved balance and decreased FOG, SBA, intermittent UE support Obstacle and turn negotiation in gym with hurdles and without. Emphasis on clock turns. SBA, intermittent SUE support.  Patient educated throughout session on appropriate technique and form using multi-modal cueing, HEP, and activity modification.   Patient  Response to interventions: 3/10 pain in R hand  ASSESSMENT Patient presents to clinic with excellent motivation to participate in therapy. Patient demonstrates deficits in gait, posture, pain, and falls risk. Patient performing turns in narrow spaces with good "clock turn" technique during today's session and  responded positively to neuromuscular re-education interventions. Pain in R hand related to use of SPC requires continued monitoring. SPC was lowered one level to promote neutral wrist position as opposed to wrist extension. Patient will benefit from continued skilled therapeutic intervention to address remaining deficits in gait, posture, pain, and falls risk in order to increase function and improve overall QOL.     PT Long Term Goals - 09/22/20 1020      PT LONG TERM GOAL #1   Title Patient will be independent with HEP in order to improve strength and balance in order to decrease fall risk and improve function at home and work.    Baseline IE: none provided at this time; 1/27: IND    Time 6    Period Weeks    Status Achieved      PT LONG TERM GOAL #2   Title Patient will improve DGI by at least 3 points in order to demonstrate clinically significant improvement in balance and decreased risk for falls.    Baseline IE: to be assessed at next session; 12/23: 12; 2/1: 14    Time 4    Period Weeks    Status On-going    Target Date 10/20/20      PT LONG TERM GOAL #3   Title Patient will decrease 5TSTS by at least 3 seconds without UE support and from 17-18" seat height in order to demonstrate clinically significant improvement in LE strength..    Baseline IE: 18.5 sec from 23.5" seat; 1/27: 16.8 sec from 23.5 " seat; 2/1: 25.1 sec from 20" seat, BUE support    Time 4    Period Weeks    Status On-going    Target Date 10/20/20      PT LONG TERM GOAL #4   Title Patient will increase 10 meter walk test to >1.52m/s as to improve gait speed for better community ambulation and to reduce  fall risk.    Baseline IE: 0.52m/s, SPC (self-selected); 2/1: 0.68m/s    Time 4    Period Weeks    Status On-going    Target Date 10/20/20      PT LONG TERM GOAL #5   Title Patient will demonstrate improved function as evidenced by a score of 65 on FOTO measure for full participation in activities at home and in the community.    Baseline IE: 68; 2/1: 61    Time 4    Period Weeks  Status On-going    Target Date 10/20/20                 Plan - 10/05/20 1239    Clinical Impression Statement Patient presents to clinic with excellent motivation to participate in therapy. Patient demonstrates deficits in gait, posture, pain, and falls risk. Patient performing turns in narrow spaces with good "clock turn" technique during today's session and  responded positively to neuromuscular re-education interventions. Pain in R hand related to use of SPC requires continued monitoring. SPC was lowered one level to promote neutral wrist position as opposed to wrist extension. Patient will benefit from continued skilled therapeutic intervention to address remaining deficits in gait, posture, pain, and falls risk in order to increase function and improve overall QOL.    Personal Factors and Comorbidities Comorbidity 3+;Fitness;Time since onset of injury/illness/exacerbation;Past/Current Experience    Comorbidities HTN, DM, HLD,    Examination-Activity Limitations Lift;Bend;Squat;Transfers;Stand;Stairs;Carry;Reach Overhead;Locomotion Level    Examination-Participation Restrictions Interpersonal Relationship;Yard Work;Laundry;Cleaning;Community Activity;Shop    Stability/Clinical Decision Making Evolving/Moderate complexity    Rehab Potential Fair    PT Frequency 2x / week    PT Duration 4 weeks    PT Treatment/Interventions ADLs/Self Care Home Management;Moist Heat;Electrical Stimulation;Cryotherapy;DME Instruction;Therapeutic activities;Gait training;Stair training;Therapeutic exercise;Balance  training;Neuromuscular re-education;Functional mobility training;Energy conservation;Manual techniques;Patient/family education;Orthotic Fit/Training    PT Next Visit Plan large amplitude training    Consulted and Agree with Plan of Care Patient           Patient will benefit from skilled therapeutic intervention in order to improve the following deficits and impairments:  Abnormal gait,Decreased balance,Pain,Postural dysfunction,Decreased coordination,Decreased activity tolerance,Improper body mechanics,Difficulty walking,Decreased mobility,Decreased endurance  Visit Diagnosis: Difficulty in walking, not elsewhere classified  Abnormal posture  Muscle weakness (generalized)  Personal history of fall     Problem List Patient Active Problem List   Diagnosis Date Noted  . BPH (benign prostatic hyperplasia) 10/03/2020  . Pain due to onychomycosis of toenails of both feet 06/18/2020  . Rash of foot 01/30/2020  . Stage 3 chronic kidney disease ([place]) 10/23/2019  . Type 2 diabetes mellitus with diabetic neuropathy, unspecified ([place]) 10/21/2019  . Impaired gait and mobility 10/14/2019  . Postural dizziness with presyncope 10/13/2019  . Ventricular premature complexes 10/13/2019  . Urge urinary incontinence 09/26/2019  . Depressed mood 09/26/2019  . Advanced care planning/counseling discussion 09/22/2018  . Weakness of both lower extremities 09/22/2018  . History of hepatitis 01/05/2018  . 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 NAFLD (nonalcoholic fatty liver disease) 09/30/2017  . Obesity, Class I, BMI 30.0-34.9 (see actual BMI) 09/18/2017  . Polyarthralgia 02/15/2017  . OSA (obstructive sleep apnea) 02/15/2017  . Partial epilepsy with impairment of consciousness ([place]) 01/29/2016  . Low vitamin B12 level 05/27/2015  . Elevated PSA 05/27/2015  . Vitamin D deficiency 03/02/2015  . Other long term (current) drug therapy 03/12/2012  . Encounter for general adult  medical examination with abnormal findings 04/26/2011  . TOTAL KNEE REPLACEMENT, LEFT, HX OF 04/07/2010  . Type 2 diabetes mellitus with diabetic nephropathy ([place]) 03/30/2010  . Hyperlipidemia associated with type 2 diabetes mellitus ([place]) 03/30/2010  . 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 Essential hypertension, benign 03/30/2010   Myles Gip PT, DPT [id number]  10/05/2020, 12:52 PM  [facility] [address] [place], [place], [address] Phone: [phone]   Fax:  [phone]  Name: Kirk Bennett. MRN: [id number] Date of Birth: [birth year]

## 2020-10-06 LAB — POC URINALSYSI DIPSTICK (AUTOMATED)
Bilirubin, UA: NEGATIVE
Blood, UA: NEGATIVE
Glucose, UA: NEGATIVE
Ketones, UA: NEGATIVE
Leukocytes, UA: NEGATIVE
Protein, UA: NEGATIVE
Spec Grav, UA: 1.025 (ref 1.010–1.025)
Urobilinogen, UA: 0.2 E.U./dL
pH, UA: 5.5 (ref 5.0–8.0)

## 2020-10-06 NOTE — Addendum Note (Signed)
Addended by: Brenton Grills on: 3/55/7322 02:54 AM   Modules accepted: Orders

## 2020-10-06 NOTE — Addendum Note (Signed)
Addended by: Brenton Grills on: 2/69/4854 62:70 PM   Modules accepted: Orders

## 2020-10-07 ENCOUNTER — Telehealth: Payer: Self-pay

## 2020-10-07 DIAGNOSIS — E1140 Type 2 diabetes mellitus with diabetic neuropathy, unspecified: Secondary | ICD-10-CM

## 2020-10-07 NOTE — Telephone Encounter (Signed)
Spoke with pt informing him we need him to come by our office to complete the income and insurance sections for app, then sign and date.  Pt verbalizes understanding and states he will come by when he can find proof of income.

## 2020-10-07 NOTE — Telephone Encounter (Signed)
Pt provided application for Free [employer] meds program ([employer]).  Dr. Synthia Innocent portion has been completed and signed.  Need pt to fill out income and insurance portion, then sign & date.  [App is in basket on Lisa's desk.]

## 2020-10-08 ENCOUNTER — Ambulatory Visit: Payer: 59 | Admitting: Physical Therapy

## 2020-10-08 LAB — URINE CULTURE
MICRO NUMBER:: 11536402
SPECIMEN QUALITY:: ADEQUATE

## 2020-10-09 ENCOUNTER — Other Ambulatory Visit: Payer: Self-pay | Admitting: Family Medicine

## 2020-10-09 MED ORDER — AMOXICILLIN-POT CLAVULANATE 500-125 MG PO TABS: 1.0000 | ORAL_TABLET | Freq: Three times a day (TID) | ORAL | 0 refills | Status: DC

## 2020-10-12 ENCOUNTER — Encounter: Payer: 59 | Admitting: Physical Therapy

## 2020-10-13 ENCOUNTER — Encounter: Payer: Self-pay | Admitting: Physical Therapy

## 2020-10-13 ENCOUNTER — Ambulatory Visit: Payer: 59 | Admitting: Physical Therapy

## 2020-10-13 ENCOUNTER — Other Ambulatory Visit: Payer: Self-pay

## 2020-10-13 ENCOUNTER — Other Ambulatory Visit: Payer: Self-pay | Admitting: Psychiatry

## 2020-10-13 ENCOUNTER — Other Ambulatory Visit: Payer: Self-pay | Admitting: Physician Assistant

## 2020-10-13 DIAGNOSIS — R296 Repeated falls: Secondary | ICD-10-CM

## 2020-10-13 DIAGNOSIS — R531 Weakness: Secondary | ICD-10-CM

## 2020-10-13 DIAGNOSIS — R293 Abnormal posture: Secondary | ICD-10-CM

## 2020-10-13 DIAGNOSIS — M6281 Muscle weakness (generalized): Secondary | ICD-10-CM

## 2020-10-13 DIAGNOSIS — R269 Unspecified abnormalities of gait and mobility: Secondary | ICD-10-CM

## 2020-10-13 DIAGNOSIS — R262 Difficulty in walking, not elsewhere classified: Secondary | ICD-10-CM

## 2020-10-13 NOTE — Therapy (Signed)
[facility] [address] [place], [place], [address] Phone: [phone]   Fax:  [phone]  Physical Therapy Treatment  Patient Details  Name: Kirk Bennett. MRN: [id number] Date of Birth: May 04, 1951 Referring Provider (PT): Holley Raring, [place]   Encounter Date: 10/13/2020   PT End of Session - 10/13/20 1306    Visit Number 15    Number of Visits 21    Date for PT Re-Evaluation 10/20/20    PT Start Time 1300    PT Stop Time 1345    PT Time Calculation (min) 45 min    Equipment Utilized During Treatment Gait belt    Activity Tolerance Patient tolerated treatment well;Patient limited by fatigue    Behavior During Therapy WFL for tasks assessed/performed           Past Medical History:  Diagnosis Date  . Cataracts, bilateral 02/2011   and suspected glaucoma, to return for f/u, no diabetic retinopathy  . CKD stage 3 due to type 2 diabetes mellitus ([place]) 2015   normal renal US, self referred to Dr Holley Raring  . 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 Mora Bellman at [facility])  . Depression    found by neuro  . Elevated PSA 05/27/2015   Serial monitoring ([place])   . Glaucoma 2015   suspect  . History of hepatitis A   . HLD (hyperlipidemia)   . HTN (hypertension)   . Knee pain    s/p replacement  . SVT (supraventricular tachycardia) ([place]) 1996  . Vitamin D deficiency 03/02/2015  . 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 Gabriel Carina endo --> 02/2016 decided to return to PCP for DM care    Past Surgical History:  Procedure Laterality Date  . CARDIAC CATHETERIZATION  July 2010   No blockages (Dr. Liliane Shi)  . COLONOSCOPY  09/16/2005   hyperplastic polyps, rpt due 10 yrs   . COLONOSCOPY WITH PROPOFOL N/A 12/11/2015   mult polyps, few TA, rpt 82yrs ([place])  . corrective  surgery amblyopia  1957  . Cystectomy or meningioma removal brain  1990   (unclear)  . Left knee surgery  1971   Torn ACL  . REPLACEMENT TOTAL KNEE  April 2011   Left [facility] Dr. Garald Balding)    There were no vitals filed for this visit.   Subjective Assessment - 10/13/20 1304    Subjective Patient notes his R hand pain has resolved since last visit. Patient denies any other concerns at this time. Patient reports feeling a bit fatigued today.    Pertinent History Patient familiar to clinic as he was seen previously for BLE weakness. Patient achieved goals with last course of PT and was able to self-manage. He notes he has maintained his level of strength and is still able to stand from couch in family room/den without complaint.    Limitations Lifting;Standing;Walking;House hold activities    How long can you sit comfortably? unlimited    How long can you stand comfortably? 30 min (9/10 pain)    How long can you walk comfortably? 30 min (9/10 pain)    Patient Stated Goals "work the back out"           TREATMENT Therapeutic Exercise: NuStep, L2-1, seat 15, x10 min, SPM target 70-80 for improved activity tolerance  4# CW LE strengthening:  Hip flexor  marches, 2x10  LAQ, 2x10  Hip abduction (knee extended), 2x10 GTB hamstring curl, 2x10  Neuromuscular Re-education: Gait in hallway with 2-4" obstacles placed to encourage large amplitude movements, SPC, SBA. VCs intermittently for stride length and arm swing.  Patient educated throughout session on appropriate technique and form using multi-modal cueing, HEP, and activity modification.   Patient Response to interventions: 3/10 pain in back with gait; resolved with rest  ASSESSMENT Patient presents to clinic with excellent motivation to participate in therapy. Patient demonstrates deficits in gait, posture, pain, and falls risk. Patient able to use step-through pattern with obstacle negotiation while maintaining increased stride length  and balance during today's session and  responded positively to neuromuscular re-education interventions. Patient will benefit from continued skilled therapeutic intervention to address remaining deficits in gait, posture, pain, and falls risk in order to increase function and improve overall QOL.     PT Long Term Goals - 09/22/20 1020      PT LONG TERM GOAL #1   Title Patient will be independent with HEP in order to improve strength and balance in order to decrease fall risk and improve function at home and work.    Baseline IE: none provided at this time; 1/27: IND    Time 6    Period Weeks    Status Achieved      PT LONG TERM GOAL #2   Title Patient will improve DGI by at least 3 points in order to demonstrate clinically significant improvement in balance and decreased risk for falls.    Baseline IE: to be assessed at next session; 12/23: 12; 2/1: 14    Time 4    Period Weeks    Status On-going    Target Date 10/20/20      PT LONG TERM GOAL #3   Title Patient will decrease 5TSTS by at least 3 seconds without UE support and from 17-18" seat height in order to demonstrate clinically significant improvement in LE strength..    Baseline IE: 18.5 sec from 23.5" seat; 1/27: 16.8 sec from 23.5 " seat; 2/1: 25.1 sec from 20" seat, BUE support    Time 4    Period Weeks    Status On-going    Target Date 10/20/20      PT LONG TERM GOAL #4   Title Patient will increase 10 meter walk test to >1.38m/s as to improve gait speed for better community ambulation and to reduce fall risk.    Baseline IE: 0.41m/s, SPC (self-selected); 2/1: 0.79m/s    Time 4    Period Weeks    Status On-going    Target Date 10/20/20      PT LONG TERM GOAL #5   Title Patient will demonstrate improved function as evidenced by a score of 65 on FOTO measure for full participation in activities at home and in the community.    Baseline IE: 57; 2/1: 61    Time 4    Period Weeks    Status On-going    Target Date  10/20/20                 Plan - 10/13/20 1307    Clinical Impression Statement Patient presents to clinic with excellent motivation to participate in therapy. Patient demonstrates deficits in gait, posture, pain, and falls risk. Patient able to use step-through pattern with obstacle negotiation while maintaining increased stride length and balance during today's session and  responded positively to neuromuscular re-education interventions. Patient will benefit from  continued skilled therapeutic intervention to address remaining deficits in gait, posture, pain, and falls risk in order to increase function and improve overall QOL.    Personal Factors and Comorbidities Comorbidity 3+;Fitness;Time since onset of injury/illness/exacerbation;Past/Current Experience    Comorbidities HTN, DM, HLD,    Examination-Activity Limitations Lift;Bend;Squat;Transfers;Stand;Stairs;Carry;Reach Overhead;Locomotion Level    Examination-Participation Restrictions Interpersonal Relationship;Yard Work;Laundry;Cleaning;Community Activity;Shop    Stability/Clinical Decision Making Evolving/Moderate complexity    Rehab Potential Fair    PT Frequency 2x / week    PT Duration 4 weeks    PT Treatment/Interventions ADLs/Self Care Home Management;Moist Heat;Electrical Stimulation;Cryotherapy;DME Instruction;Therapeutic activities;Gait training;Stair training;Therapeutic exercise;Balance training;Neuromuscular re-education;Functional mobility training;Energy conservation;Manual techniques;Patient/family education;Orthotic Fit/Training    PT Next Visit Plan large amplitude training    Consulted and Agree with Plan of Care Patient           Patient will benefit from skilled therapeutic intervention in order to improve the following deficits and impairments:  Abnormal gait,Decreased balance,Pain,Postural dysfunction,Decreased coordination,Decreased activity tolerance,Improper body mechanics,Difficulty walking,Decreased  mobility,Decreased endurance  Visit Diagnosis: Difficulty in walking, not elsewhere classified  Abnormal posture  Muscle weakness (generalized)     Problem List Patient Active Problem List   Diagnosis Date Noted  . BPH (benign prostatic hyperplasia) 10/03/2020  . Pain due to onychomycosis of toenails of both feet 06/18/2020  . Rash of foot 01/30/2020  . Stage 3 chronic kidney disease ([place]) 10/23/2019  . Type 2 diabetes mellitus with diabetic neuropathy, unspecified ([place]) 10/21/2019  . Impaired gait and mobility 10/14/2019  . Postural dizziness with presyncope 10/13/2019  . Ventricular premature complexes 10/13/2019  . Urge urinary incontinence 09/26/2019  . Depressed mood 09/26/2019  . Advanced care planning/counseling discussion 09/22/2018  . Weakness of both lower extremities 09/22/2018  . History of hepatitis 01/05/2018  . 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 NAFLD (nonalcoholic fatty liver disease) 09/30/2017  . Obesity, Class I, BMI 30.0-34.9 (see actual BMI) 09/18/2017  . Polyarthralgia 02/15/2017  . OSA (obstructive sleep apnea) 02/15/2017  . Partial epilepsy with impairment of consciousness ([place]) 01/29/2016  . Low vitamin B12 level 05/27/2015  . Elevated PSA 05/27/2015  . Vitamin D deficiency 03/02/2015  . Other long term (current) drug therapy 03/12/2012  . Encounter for general adult medical examination with abnormal findings 04/26/2011  . TOTAL KNEE REPLACEMENT, LEFT, HX OF 04/07/2010  . Type 2 diabetes mellitus with diabetic nephropathy ([place]) 03/30/2010  . Hyperlipidemia associated with type 2 diabetes mellitus ([place]) 03/30/2010  . 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 Essential hypertension, benign 03/30/2010   Myles Gip PT, DPT [phone]  10/13/2020, 4:28 PM  [facility] [address] [place], [place], [address] Phone: [phone]   Fax:   [phone]  Name: Kirk Bennett. MRN: [id number] Date of Birth: [birth year]

## 2020-10-15 ENCOUNTER — Telehealth: Payer: Self-pay | Admitting: Family Medicine

## 2020-10-15 ENCOUNTER — Ambulatory Visit: Payer: 59 | Admitting: Physical Therapy

## 2020-10-15 ENCOUNTER — Other Ambulatory Visit: Payer: Self-pay | Admitting: Family Medicine

## 2020-10-15 ENCOUNTER — Other Ambulatory Visit: Payer: Self-pay

## 2020-10-15 DIAGNOSIS — M6281 Muscle weakness (generalized): Secondary | ICD-10-CM

## 2020-10-15 DIAGNOSIS — R262 Difficulty in walking, not elsewhere classified: Secondary | ICD-10-CM | POA: Diagnosis not present

## 2020-10-15 DIAGNOSIS — R293 Abnormal posture: Secondary | ICD-10-CM

## 2020-10-15 DIAGNOSIS — Z9181 History of falling: Secondary | ICD-10-CM

## 2020-10-15 NOTE — Therapy (Signed)
[facility] [address]. [place], [place], [address] Phone: [phone]   Fax:  [phone]  Physical Therapy Treatment  Patient Details  Name: Kirk Bennett. MRN: [id number] Date of Birth: 16-Mar-1951 Referring Provider (PT): Holley Raring, [place]   Encounter Date: 10/15/2020   PT End of Session - 10/15/20 1807    Visit Number 16    Number of Visits 21    Date for PT Re-Evaluation 10/20/20    PT Start Time 1000    PT Stop Time 1045    PT Time Calculation (min) 45 min    Equipment Utilized During Treatment Gait belt    Activity Tolerance Patient tolerated treatment well;Patient limited by fatigue    Behavior During Therapy WFL for tasks assessed/performed           Past Medical History:  Diagnosis Date  . Cataracts, bilateral 02/2011   and suspected glaucoma, to return for f/u, no diabetic retinopathy  . CKD stage 3 due to type 2 diabetes mellitus ([place]) 2015   normal renal US, self referred to Dr Holley Raring  . 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 Mora Bellman at [facility])  . Depression    found by neuro  . Elevated PSA 05/27/2015   Serial monitoring ([place])   . Glaucoma 2015   suspect  . History of hepatitis A   . HLD (hyperlipidemia)   . HTN (hypertension)   . Knee pain    s/p replacement  . SVT (supraventricular tachycardia) ([place]) 1996  . Vitamin D deficiency 03/02/2015  . 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 Gabriel Carina endo --> 02/2016 decided to return to PCP for DM care    Past Surgical History:  Procedure Laterality Date  . CARDIAC CATHETERIZATION  July 2010   No blockages (Dr. Liliane Shi)  . COLONOSCOPY  09/16/2005   hyperplastic polyps, rpt due 10 yrs   . COLONOSCOPY WITH PROPOFOL N/A 12/11/2015   mult polyps, few TA, rpt 40yrs ([place])  . corrective  surgery amblyopia  1957  . Cystectomy or meningioma removal brain  1990   (unclear)  . Left knee surgery  1971   Torn ACL  . REPLACEMENT TOTAL KNEE  April 2011   Left [facility] Dr. Garald Balding)    There were no vitals filed for this visit.   Subjective Assessment - 10/15/20 1806    Subjective Patient denies any concerns or significant changes. Does not report any falls since last session.    Pertinent History Patient familiar to clinic as he was seen previously for BLE weakness. Patient achieved goals with last course of PT and was able to self-manage. He notes he has maintained his level of strength and is still able to stand from couch in family room/den without complaint.    Limitations Lifting;Standing;Walking;House hold activities    How long can you sit comfortably? unlimited    How long can you stand comfortably? 30 min (9/10 pain)    How long can you walk comfortably? 30 min (9/10 pain)    Patient Stated Goals "work the back out"           TREATMENT Therapeutic Exercise: NuStep, L2-1, seat 15, x10 min, SPM target 70-80 for improved activity tolerance  4# CW LE seated strengthening:  Hip flexor marches, 2x10  LAQ, 2x10, 1-2-3 tempo Seated clamshells, BTB,  x20, 1-2-3 tempo Seated hip adduction squeezes, 5 sec x20 4# CW LE standing strengthening:  Stair taps, 2x10  Hip abduction, 2x10  Hamstring curls, 2x10 STS from 2 foam elevated seat, 2x10   Patient educated throughout session on appropriate technique and form using multi-modal cueing, HEP, and activity modification.   Patient Response to interventions: Does not report increased pain  ASSESSMENT Patient presents to clinic with excellent motivation to participate in therapy. Patient demonstrates deficits in gait, posture, pain, and falls risk. Patient with good form throughout strength based session and responded positively to increased repetition/resistance when applied to interventions. Patient will benefit from  continued skilled therapeutic intervention to address remaining deficits in gait, posture, pain, and falls risk in order to increase function and improve overall QOL.     PT Long Term Goals - 09/22/20 1020      PT LONG TERM GOAL #1   Title Patient will be independent with HEP in order to improve strength and balance in order to decrease fall risk and improve function at home and work.    Baseline IE: none provided at this time; 1/27: IND    Time 6    Period Weeks    Status Achieved      PT LONG TERM GOAL #2   Title Patient will improve DGI by at least 3 points in order to demonstrate clinically significant improvement in balance and decreased risk for falls.    Baseline IE: to be assessed at next session; 12/23: 12; 2/1: 14    Time 4    Period Weeks    Status On-going    Target Date 10/20/20      PT LONG TERM GOAL #3   Title Patient will decrease 5TSTS by at least 3 seconds without UE support and from 17-18" seat height in order to demonstrate clinically significant improvement in LE strength..    Baseline IE: 18.5 sec from 23.5" seat; 1/27: 16.8 sec from 23.5 " seat; 2/1: 25.1 sec from 20" seat, BUE support    Time 4    Period Weeks    Status On-going    Target Date 10/20/20      PT LONG TERM GOAL #4   Title Patient will increase 10 meter walk test to >1.82m/s as to improve gait speed for better community ambulation and to reduce fall risk.    Baseline IE: 0.36m/s, SPC (self-selected); 2/1: 0.17m/s    Time 4    Period Weeks    Status On-going    Target Date 10/20/20      PT LONG TERM GOAL #5   Title Patient will demonstrate improved function as evidenced by a score of 65 on FOTO measure for full participation in activities at home and in the community.    Baseline IE: 51; 2/1: 61    Time 4    Period Weeks    Status On-going    Target Date 10/20/20                 Plan - 10/15/20 1807    Clinical Impression Statement Patient presents to clinic with excellent  motivation to participate in therapy. Patient demonstrates deficits in gait, posture, pain, and falls risk. Patient with good form throughout strength based session and responded positively to increased repetition/resistance when applied to interventions. Patient will benefit from continued skilled therapeutic intervention to address remaining deficits in gait, posture, pain, and falls risk in order to increase function and improve overall QOL.  Personal Factors and Comorbidities Comorbidity 3+;Fitness;Time since onset of injury/illness/exacerbation;Past/Current Experience    Comorbidities HTN, DM, HLD,    Examination-Activity Limitations Lift;Bend;Squat;Transfers;Stand;Stairs;Carry;Reach Overhead;Locomotion Level    Examination-Participation Restrictions Interpersonal Relationship;Yard Work;Laundry;Cleaning;Community Activity;Shop    Stability/Clinical Decision Making Evolving/Moderate complexity    Rehab Potential Fair    PT Frequency 2x / week    PT Duration 4 weeks    PT Treatment/Interventions ADLs/Self Care Home Management;Moist Heat;Electrical Stimulation;Cryotherapy;DME Instruction;Therapeutic activities;Gait training;Stair training;Therapeutic exercise;Balance training;Neuromuscular re-education;Functional mobility training;Energy conservation;Manual techniques;Patient/family education;Orthotic Fit/Training    PT Next Visit Plan large amplitude training    Consulted and Agree with Plan of Care Patient           Patient will benefit from skilled therapeutic intervention in order to improve the following deficits and impairments:  Abnormal gait,Decreased balance,Pain,Postural dysfunction,Decreased coordination,Decreased activity tolerance,Improper body mechanics,Difficulty walking,Decreased mobility,Decreased endurance  Visit Diagnosis: Difficulty in walking, not elsewhere classified  Abnormal posture  Muscle weakness (generalized)  Personal history of fall     Problem  List Patient Active Problem List   Diagnosis Date Noted  . BPH (benign prostatic hyperplasia) 10/03/2020  . Pain due to onychomycosis of toenails of both feet 06/18/2020  . Rash of foot 01/30/2020  . Stage 3 chronic kidney disease ([place]) 10/23/2019  . Type 2 diabetes mellitus with diabetic neuropathy, unspecified ([place]) 10/21/2019  . Impaired gait and mobility 10/14/2019  . Postural dizziness with presyncope 10/13/2019  . Ventricular premature complexes 10/13/2019  . Urge urinary incontinence 09/26/2019  . Depressed mood 09/26/2019  . Advanced care planning/counseling discussion 09/22/2018  . Weakness of both lower extremities 09/22/2018  . History of hepatitis 01/05/2018  . 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 NAFLD (nonalcoholic fatty liver disease) 09/30/2017  . Obesity, Class I, BMI 30.0-34.9 (see actual BMI) 09/18/2017  . Polyarthralgia 02/15/2017  . OSA (obstructive sleep apnea) 02/15/2017  . Partial epilepsy with impairment of consciousness ([place]) 01/29/2016  . Low vitamin B12 level 05/27/2015  . Elevated PSA 05/27/2015  . Vitamin D deficiency 03/02/2015  . Other long term (current) drug therapy 03/12/2012  . Encounter for general adult medical examination with abnormal findings 04/26/2011  . TOTAL KNEE REPLACEMENT, LEFT, HX OF 04/07/2010  . Type 2 diabetes mellitus with diabetic nephropathy ([place]) 03/30/2010  . Hyperlipidemia associated with type 2 diabetes mellitus ([place]) 03/30/2010  . 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 Essential hypertension, benign 03/30/2010   Myles Gip PT, DPT [phone]  10/15/2020, 6:12 PM  [facility] [address] [place], [place], [address] Phone: [phone]   Fax:  [phone]  Name: [name] MRN: [id number] Date of Birth: 1950/09/24

## 2020-10-15 NOTE — Telephone Encounter (Signed)
Need to triage pt to see why he needs additional augmentin. Tried to contact pt but no answer and no VM.

## 2020-10-15 NOTE — Telephone Encounter (Signed)
Pt called in wanted to know about why he couldn't get a refiill augmentin

## 2020-10-16 NOTE — Telephone Encounter (Signed)
Attempted to contact pt.  No answer.  No vm.  Need to find out why he needs refill on abx.

## 2020-10-19 ENCOUNTER — Encounter: Payer: 59 | Admitting: Physical Therapy

## 2020-10-19 NOTE — Telephone Encounter (Signed)
Attempted to contact pt.  No answer.  No vm.  Need to find out why he needs refill on abx.

## 2020-10-20 ENCOUNTER — Encounter: Payer: Self-pay | Admitting: Physical Therapy

## 2020-10-20 ENCOUNTER — Ambulatory Visit: Payer: 59 | Attending: Nephrology | Admitting: Physical Therapy

## 2020-10-20 ENCOUNTER — Other Ambulatory Visit: Payer: Self-pay

## 2020-10-20 DIAGNOSIS — R296 Repeated falls: Secondary | ICD-10-CM | POA: Insufficient documentation

## 2020-10-20 DIAGNOSIS — R262 Difficulty in walking, not elsewhere classified: Secondary | ICD-10-CM | POA: Insufficient documentation

## 2020-10-20 DIAGNOSIS — M6281 Muscle weakness (generalized): Secondary | ICD-10-CM | POA: Diagnosis present

## 2020-10-20 DIAGNOSIS — R293 Abnormal posture: Secondary | ICD-10-CM | POA: Diagnosis present

## 2020-10-20 NOTE — Therapy (Signed)
[facility] [address] [place], [place], [address] Phone: [phone]   Fax:  [phone]  Physical Therapy Treatment  Patient Details  Name: Kirk Bennett. MRN: [id number] Date of Birth: 1951-04-17 Referring Provider (PT): Holley Raring, [place]   Encounter Date: 10/20/2020   PT End of Session - 10/20/20 1057    Visit Number 17    Number of Visits 21    Date for PT Re-Evaluation 10/20/20    PT Start Time 1055    PT Stop Time 1150    PT Time Calculation (min) 55 min    Equipment Utilized During Treatment Gait belt    Activity Tolerance Patient tolerated treatment well;Patient limited by fatigue    Behavior During Therapy WFL for tasks assessed/performed           Past Medical History:  Diagnosis Date  . Cataracts, bilateral 02/2011   and suspected glaucoma, to return for f/u, no diabetic retinopathy  . CKD stage 3 due to type 2 diabetes mellitus ([place]) 2015   normal renal US, self referred to Dr Holley Raring  . 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 Mora Bellman at [facility])  . Depression    found by neuro  . Elevated PSA 05/27/2015   Serial monitoring ([place])   . Glaucoma 2015   suspect  . History of hepatitis A   . HLD (hyperlipidemia)   . HTN (hypertension)   . Knee pain    s/p replacement  . SVT (supraventricular tachycardia) ([place]) 1996  . Vitamin D deficiency 03/02/2015  . 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 Gabriel Carina endo --> 02/2016 decided to return to PCP for DM care    Past Surgical History:  Procedure Laterality Date  . CARDIAC CATHETERIZATION  July 2010   No blockages (Dr. Liliane Shi)  . COLONOSCOPY  09/16/2005   hyperplastic polyps, rpt due 10 yrs   . COLONOSCOPY WITH PROPOFOL N/A 12/11/2015   mult polyps, few TA, rpt 60yrs ([place])  . corrective surgery  amblyopia  1957  . Cystectomy or meningioma removal brain  1990   (unclear)  . Left knee surgery  1971   Torn ACL  . REPLACEMENT TOTAL KNEE  April 2011   Left [facility] Dr. Garald Balding)    There were no vitals filed for this visit.   Subjective Assessment - 10/20/20 1056    Subjective Patient states that he has had no significant changes since last session. Does not report any falls since last session.    Pertinent History Patient familiar to clinic as he was seen previously for BLE weakness. Patient achieved goals with last course of PT and was able to self-manage. He notes he has maintained his level of strength and is still able to stand from couch in family room/den without complaint.    Limitations Lifting;Standing;Walking;House hold activities    How long can you sit comfortably? unlimited    How long can you stand comfortably? 30 min (9/10 pain)    How long can you walk comfortably? 30 min (9/10 pain)    Patient Stated Goals "work the back out"              [facility] PT Assessment - 10/20/20 0001      Dynamic Gait Index   Level Surface Mild Impairment    Change in Gait Speed  Mild Impairment    Gait with Horizontal Head Turns Mild Impairment    Gait with Vertical Head Turns Mild Impairment    Gait and Pivot Turn Normal    Step Over Obstacle Normal    Step Around Obstacles Normal    Steps Moderate Impairment    Total Score 18            TREATMENT Therapeutic Exercise: NuStep, L2-4, seat 15, x5 min, SPM target 70-80 for improved activity tolerance (during history) STS from 22" seat, BUE support 3x5  Neuromuscular Re-education: Reassessed goals; see below.  Patient educated throughout session on appropriate technique and form using multi-modal cueing, HEP, and activity modification.   Patient Response to interventions: Expresses desire to continue with PT 2x/week.  ASSESSMENT Patient presents to clinic with excellent motivation to participate in therapy. Patient  demonstrates deficits in gait, posture, pain, and falls risk. Patient continues to show progress toward goals on reassessment during today's session and patient has responded positively to both strengthening and neuromuscular re-education interventions focused on gait stability. Patient's condition has the potential to improve in response to therapy. Maximum improvement is yet to be obtained. The anticipated improvement is attainable and reasonable in a generally predictable time. Patient will benefit from continued skilled therapeutic intervention to address remaining deficits in gait, posture, pain, and falls risk in order to increase function and improve overall QOL.     PT Long Term Goals - 10/20/20 1058      PT LONG TERM GOAL #1   Title Patient will be independent with HEP in order to improve strength and balance in order to decrease fall risk and improve function at home and work.    Baseline IE: none provided at this time; 1/27: IND    Time 6    Period Weeks    Status Achieved      PT LONG TERM GOAL #2   Title Patient will improve DGI by at least 3 points in order to demonstrate clinically significant improvement in balance and decreased risk for falls.    Baseline IE: to be assessed at next session; 12/23: 12; 2/1: 14; 3/1: 18    Time 8    Period Weeks    Status On-going    Target Date 12/15/20      PT LONG TERM GOAL #3   Title Patient will decrease 5TSTS by at least 3 seconds without UE support and from 17-18" seat height in order to demonstrate clinically significant improvement in LE strength..    Baseline IE: 18.5 sec from 23.5" seat; 1/27: 16.8 sec from 23.5 " seat; 2/1: 25.1 sec from 20" seat, BUE support; 3/1: 28.2 sec from 22" surface    Time 8    Period Weeks    Status On-going    Target Date 12/15/20      PT LONG TERM GOAL #4   Title Patient will increase 10 meter walk test to >1.31m/s as to improve gait speed for better community ambulation and to reduce fall risk.     Baseline IE: 0.46m/s, SPC (self-selected); 2/1: 0.2m/s; 3/1: 0.87 m/s with SPC, 0.69 m/s without    Time 8    Period Weeks    Status On-going    Target Date 12/15/20      PT LONG TERM GOAL #5   Title Patient will demonstrate improved function as evidenced by a score of 65 on FOTO measure for full participation in activities at home and in the community.  Baseline IE: 14; 2/1: 57; 3/1: 63    Time 8    Period Weeks    Status On-going    Target Date 12/15/20                 Plan - 10/20/20 1058    Clinical Impression Statement Patient presents to clinic with excellent motivation to participate in therapy. Patient demonstrates deficits in gait, posture, pain, and falls risk. Patient continues to show progress toward goals on reassessment during today's session and patient has responded positively to both strengthening and neuromuscular re-education interventions focused on gait stability. Patient's condition has the potential to improve in response to therapy. Maximum improvement is yet to be obtained. The anticipated improvement is attainable and reasonable in a generally predictable time. Patient will benefit from continued skilled therapeutic intervention to address remaining deficits in gait, posture, pain, and falls risk in order to increase function and improve overall QOL.    Personal Factors and Comorbidities Comorbidity 3+;Fitness;Time since onset of injury/illness/exacerbation;Past/Current Experience    Comorbidities HTN, DM, HLD,    Examination-Activity Limitations Lift;Bend;Squat;Transfers;Stand;Stairs;Carry;Reach Overhead;Locomotion Level    Examination-Participation Restrictions Interpersonal Relationship;Yard Work;Laundry;Cleaning;Community Activity;Shop    Stability/Clinical Decision Making Evolving/Moderate complexity    Rehab Potential Fair    PT Frequency 2x / week    PT Duration 8 weeks    PT Treatment/Interventions ADLs/Self Care Home Management;Moist  Heat;Electrical Stimulation;Cryotherapy;DME Instruction;Therapeutic activities;Gait training;Stair training;Therapeutic exercise;Balance training;Neuromuscular re-education;Functional mobility training;Energy conservation;Manual techniques;Patient/family education;Orthotic Fit/Training    Consulted and Agree with Plan of Care Patient           Patient will benefit from skilled therapeutic intervention in order to improve the following deficits and impairments:  Abnormal gait,Decreased balance,Pain,Postural dysfunction,Decreased coordination,Decreased activity tolerance,Improper body mechanics,Difficulty walking,Decreased mobility,Decreased endurance  Visit Diagnosis: Difficulty in walking, not elsewhere classified  Abnormal posture  Muscle weakness (generalized)  Repeated falls     Problem List Patient Active Problem List   Diagnosis Date Noted  . BPH (benign prostatic hyperplasia) 10/03/2020  . Pain due to onychomycosis of toenails of both feet 06/18/2020  . Rash of foot 01/30/2020  . Stage 3 chronic kidney disease ([place]) 10/23/2019  . Type 2 diabetes mellitus with diabetic neuropathy, unspecified ([place]) 10/21/2019  . Impaired gait and mobility 10/14/2019  . Postural dizziness with presyncope 10/13/2019  . Ventricular premature complexes 10/13/2019  . Urge urinary incontinence 09/26/2019  . Depressed mood 09/26/2019  . Advanced care planning/counseling discussion 09/22/2018  . Weakness of both lower extremities 09/22/2018  . History of hepatitis 01/05/2018  . 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 NAFLD (nonalcoholic fatty liver disease) 09/30/2017  . Obesity, Class I, BMI 30.0-34.9 (see actual BMI) 09/18/2017  . Polyarthralgia 02/15/2017  . OSA (obstructive sleep apnea) 02/15/2017  . Partial epilepsy with impairment of consciousness ([place]) 01/29/2016  . Low vitamin B12 level 05/27/2015  . Elevated PSA 05/27/2015  . Vitamin D deficiency 03/02/2015  . Other  long term (current) drug therapy 03/12/2012  . Encounter for general adult medical examination with abnormal findings 04/26/2011  . TOTAL KNEE REPLACEMENT, LEFT, HX OF 04/07/2010  . Type 2 diabetes mellitus with diabetic nephropathy ([place]) 03/30/2010  . Hyperlipidemia associated with type 2 diabetes mellitus ([place]) 03/30/2010  . 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 Essential hypertension, benign 03/30/2010   Myles Gip PT, DPT [phone]  10/20/2020, 1:04 PM  [facility] [address] [place], [place], [address] Phone: [phone]  Fax:  [phone]  Name: Kirk Bennett. MRN: [id number] Date of Birth: [birth year]

## 2020-10-22 ENCOUNTER — Ambulatory Visit: Payer: 59 | Admitting: Physical Therapy

## 2020-10-22 ENCOUNTER — Other Ambulatory Visit: Payer: Self-pay

## 2020-10-22 ENCOUNTER — Encounter: Payer: Self-pay | Admitting: Physical Therapy

## 2020-10-22 DIAGNOSIS — R262 Difficulty in walking, not elsewhere classified: Secondary | ICD-10-CM | POA: Diagnosis not present

## 2020-10-22 DIAGNOSIS — M6281 Muscle weakness (generalized): Secondary | ICD-10-CM

## 2020-10-22 DIAGNOSIS — R293 Abnormal posture: Secondary | ICD-10-CM

## 2020-10-22 DIAGNOSIS — R296 Repeated falls: Secondary | ICD-10-CM

## 2020-10-22 NOTE — Therapy (Signed)
[facility] [address]. [place], [place], [address] Phone: [phone]   Fax:  [phone]  Physical Therapy Treatment  Patient Details  Name: Kirk Bennett. MRN: [id number] Date of Birth: 19-Jun-1951 Referring Provider (PT): Holley Raring, [place]   Encounter Date: 10/22/2020   PT End of Session - 10/22/20 1425    Visit Number 18    Number of Visits 37    Date for PT Re-Evaluation 12/15/20    PT Start Time 1005    PT Stop Time 1045    PT Time Calculation (min) 40 min    Equipment Utilized During Treatment Gait belt    Activity Tolerance Patient tolerated treatment well;Patient limited by fatigue    Behavior During Therapy WFL for tasks assessed/performed           Past Medical History:  Diagnosis Date  . Cataracts, bilateral 02/2011   and suspected glaucoma, to return for f/u, no diabetic retinopathy  . CKD stage 3 due to type 2 diabetes mellitus ([place]) 2015   normal renal US, self referred to Dr Holley Raring  . 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 Mora Bellman at [facility])  . Depression    found by neuro  . Elevated PSA 05/27/2015   Serial monitoring ([place])   . Glaucoma 2015   suspect  . History of hepatitis A   . HLD (hyperlipidemia)   . HTN (hypertension)   . Knee pain    s/p replacement  . SVT (supraventricular tachycardia) ([place]) 1996  . Vitamin D deficiency 03/02/2015  . 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 Gabriel Carina endo --> 02/2016 decided to return to PCP for DM care    Past Surgical History:  Procedure Laterality Date  . CARDIAC CATHETERIZATION  July 2010   No blockages (Dr. Liliane Shi)  . COLONOSCOPY  09/16/2005   hyperplastic polyps, rpt due 10 yrs   . COLONOSCOPY WITH PROPOFOL N/A 12/11/2015   mult polyps, few TA, rpt 80yrs ([place])  . corrective surgery  amblyopia  1957  . Cystectomy or meningioma removal brain  1990   (unclear)  . Left knee surgery  1971   Torn ACL  . REPLACEMENT TOTAL KNEE  April 2011   Left [facility] Dr. Garald Balding)    There were no vitals filed for this visit.   Subjective Assessment - 10/22/20 1424    Subjective Patient denies any falls, changes, or concerns since last session. Notes some increased knee pain today.    Pertinent History Patient familiar to clinic as he was seen previously for BLE weakness. Patient achieved goals with last course of PT and was able to self-manage. He notes he has maintained his level of strength and is still able to stand from couch in family room/den without complaint.    Limitations Lifting;Standing;Walking;House hold activities    How long can you sit comfortably? unlimited    How long can you stand comfortably? 30 min (9/10 pain)    How long can you walk comfortably? 30 min (9/10 pain)    Patient Stated Goals "work the back out"            TREATMENT Therapeutic Exercise: NuStep, L2-1, seat 15, x5 min, SPM target 70-80 for improved activity tolerance  UBE, target 60, seat 14, x2.5 min fwd/bwd each for improved postural endurance 4# CW LE  seated strengthening:  LAQ, 3x10, 1-2-3 tempo 4# CW LE standing strengthening:  Hip flexion marches, 3x10  Hip abduction, 3x10  Hamstring curls, 3x10 STS from 2 foam elevated seat, x5   Patient educated throughout session on appropriate technique and form using multi-modal cueing, HEP, and activity modification.   Patient Response to interventions: Gives a thumbs down.  ASSESSMENT Patient presents to clinic with excellent motivation to participate in therapy. Patient demonstrates deficits in gait, posture, pain, and falls risk. Patient with good form throughout strength based session and responded positively to increased repetition/resistance when applied to interventions despite increased fatigue at end of session. Patient will benefit from  continued skilled therapeutic intervention to address remaining deficits in gait, posture, pain, and falls risk in order to increase function and improve overall QOL.      PT Long Term Goals - 10/20/20 1058      PT LONG TERM GOAL #1   Title Patient will be independent with HEP in order to improve strength and balance in order to decrease fall risk and improve function at home and work.    Baseline IE: none provided at this time; 1/27: IND    Time 6    Period Weeks    Status Achieved      PT LONG TERM GOAL #2   Title Patient will improve DGI by at least 3 points in order to demonstrate clinically significant improvement in balance and decreased risk for falls.    Baseline IE: to be assessed at next session; 12/23: 12; 2/1: 14; 3/1: 18    Time 8    Period Weeks    Status On-going    Target Date 12/15/20      PT LONG TERM GOAL #3   Title Patient will decrease 5TSTS by at least 3 seconds without UE support and from 17-18" seat height in order to demonstrate clinically significant improvement in LE strength..    Baseline IE: 18.5 sec from 23.5" seat; 1/27: 16.8 sec from 23.5 " seat; 2/1: 25.1 sec from 20" seat, BUE support; 3/1: 28.2 sec from 22" surface    Time 8    Period Weeks    Status On-going    Target Date 12/15/20      PT LONG TERM GOAL #4   Title Patient will increase 10 meter walk test to >1.34m/s as to improve gait speed for better community ambulation and to reduce fall risk.    Baseline IE: 0.80m/s, SPC (self-selected); 2/1: 0.51m/s; 3/1: 0.87 m/s with SPC, 0.69 m/s without    Time 8    Period Weeks    Status On-going    Target Date 12/15/20      PT LONG TERM GOAL #5   Title Patient will demonstrate improved function as evidenced by a score of 65 on FOTO measure for full participation in activities at home and in the community.    Baseline IE: 41; 2/1: 61; 3/1: 63    Time 8    Period Weeks    Status On-going    Target Date 12/15/20                 Plan -  10/22/20 1425    Clinical Impression Statement Patient presents to clinic with excellent motivation to participate in therapy. Patient demonstrates deficits in gait, posture, pain, and falls risk. Patient with good form throughout strength based session and responded positively to increased repetition/resistance when applied to interventions despite increased fatigue at end of session. Patient  will benefit from continued skilled therapeutic intervention to address remaining deficits in gait, posture, pain, and falls risk in order to increase function and improve overall QOL.    Personal Factors and Comorbidities Comorbidity 3+;Fitness;Time since onset of injury/illness/exacerbation;Past/Current Experience    Comorbidities HTN, DM, HLD,    Examination-Activity Limitations Lift;Bend;Squat;Transfers;Stand;Stairs;Carry;Reach Overhead;Locomotion Level    Examination-Participation Restrictions Interpersonal Relationship;Yard Work;Laundry;Cleaning;Community Activity;Shop    Stability/Clinical Decision Making Evolving/Moderate complexity    Rehab Potential Fair    PT Frequency 2x / week    PT Duration 8 weeks    PT Treatment/Interventions ADLs/Self Care Home Management;Moist Heat;Electrical Stimulation;Cryotherapy;DME Instruction;Therapeutic activities;Gait training;Stair training;Therapeutic exercise;Balance training;Neuromuscular re-education;Functional mobility training;Energy conservation;Manual techniques;Patient/family education;Orthotic Fit/Training    Consulted and Agree with Plan of Care Patient           Patient will benefit from skilled therapeutic intervention in order to improve the following deficits and impairments:  Abnormal gait,Decreased balance,Pain,Postural dysfunction,Decreased coordination,Decreased activity tolerance,Improper body mechanics,Difficulty walking,Decreased mobility,Decreased endurance  Visit Diagnosis: Difficulty in walking, not elsewhere classified  Abnormal  posture  Muscle weakness (generalized)  Repeated falls     Problem List Patient Active Problem List   Diagnosis Date Noted  . BPH (benign prostatic hyperplasia) 10/03/2020  . Pain due to onychomycosis of toenails of both feet 06/18/2020  . Rash of foot 01/30/2020  . Stage 3 chronic kidney disease ([place]) 10/23/2019  . Type 2 diabetes mellitus with diabetic neuropathy, unspecified ([place]) 10/21/2019  . Impaired gait and mobility 10/14/2019  . Postural dizziness with presyncope 10/13/2019  . Ventricular premature complexes 10/13/2019  . Urge urinary incontinence 09/26/2019  . Depressed mood 09/26/2019  . Advanced care planning/counseling discussion 09/22/2018  . Weakness of both lower extremities 09/22/2018  . History of hepatitis 01/05/2018  . 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 NAFLD (nonalcoholic fatty liver disease) 09/30/2017  . Obesity, Class I, BMI 30.0-34.9 (see actual BMI) 09/18/2017  . Polyarthralgia 02/15/2017  . OSA (obstructive sleep apnea) 02/15/2017  . Partial epilepsy with impairment of consciousness ([place]) 01/29/2016  . Low vitamin B12 level 05/27/2015  . Elevated PSA 05/27/2015  . Vitamin D deficiency 03/02/2015  . Other long term (current) drug therapy 03/12/2012  . Encounter for general adult medical examination with abnormal findings 04/26/2011  . TOTAL KNEE REPLACEMENT, LEFT, HX OF 04/07/2010  . Type 2 diabetes mellitus with diabetic nephropathy ([place]) 03/30/2010  . Hyperlipidemia associated with type 2 diabetes mellitus ([place]) 03/30/2010  . 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 Essential hypertension, benign 03/30/2010   Myles Gip PT, DPT [phone]  10/22/2020, 2:28 PM  [employer] [facility] [address] [place], [place], [address] Phone: [phone]   Fax:  [phone]  Name: Kirk Bennett. MRN: [id number] Date of Birth: 10/26/1950

## 2020-10-22 NOTE — Telephone Encounter (Signed)
Spoke with pt reminding him we still need his proof-of-income to complete the PAP form for his medication.  Pt verbalizes understanding and says he will bring it in.

## 2020-10-22 NOTE — Telephone Encounter (Signed)
Spoke with pt asking if he still need abx.  States he does not.

## 2020-10-26 ENCOUNTER — Other Ambulatory Visit: Payer: Self-pay

## 2020-10-26 ENCOUNTER — Encounter: Payer: 59 | Admitting: Physical Therapy

## 2020-10-26 ENCOUNTER — Ambulatory Visit
Admission: RE | Admit: 2020-10-26 | Discharge: 2020-10-26 | Disposition: A | Discharge status: Home / Self Care | Payer: 59 | Source: Ambulatory Visit | Attending: Psychiatry | Admitting: Psychiatry

## 2020-10-26 DIAGNOSIS — R296 Repeated falls: Secondary | ICD-10-CM | POA: Diagnosis present

## 2020-10-26 DIAGNOSIS — R531 Weakness: Secondary | ICD-10-CM | POA: Insufficient documentation

## 2020-10-26 DIAGNOSIS — R269 Unspecified abnormalities of gait and mobility: Secondary | ICD-10-CM | POA: Insufficient documentation

## 2020-10-27 ENCOUNTER — Ambulatory Visit: Payer: 59 | Admitting: Physical Therapy

## 2020-10-27 DIAGNOSIS — R262 Difficulty in walking, not elsewhere classified: Secondary | ICD-10-CM | POA: Diagnosis not present

## 2020-10-27 DIAGNOSIS — M6281 Muscle weakness (generalized): Secondary | ICD-10-CM

## 2020-10-27 DIAGNOSIS — R293 Abnormal posture: Secondary | ICD-10-CM

## 2020-10-27 DIAGNOSIS — R296 Repeated falls: Secondary | ICD-10-CM

## 2020-10-27 NOTE — Therapy (Signed)
[facility] [address]. [place], [place], [address] Phone: [phone]   Fax:  [phone]  Physical Therapy Treatment  Patient Details  Name: Kirk Bennett. MRN: [id number] Date of Birth: [birth year] Referring Provider (PT): Holley Raring, [place]   Encounter Date: 10/27/2020   PT End of Session - 10/27/20 1109    Visit Number 19    Number of Visits 37    Date for PT Re-Evaluation 12/15/20    PT Start Time 1100    PT Stop Time 1145    PT Time Calculation (min) 45 min    Equipment Utilized During Treatment Gait belt    Activity Tolerance Patient tolerated treatment well;Patient limited by fatigue;Patient limited by pain    Behavior During Therapy WFL for tasks assessed/performed           Past Medical History:  Diagnosis Date  . Cataracts, bilateral 02/2011   and suspected glaucoma, to return for f/u, no diabetic retinopathy  . CKD stage 3 due to type 2 diabetes mellitus ([place]) 2015   normal renal US, self referred to Dr Holley Raring  . 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 Mora Bellman at [facility])  . Depression    found by neuro  . Elevated PSA 05/27/2015   Serial monitoring ([place])   . Glaucoma 2015   suspect  . History of hepatitis A   . HLD (hyperlipidemia)   . HTN (hypertension)   . Knee pain    s/p replacement  . SVT (supraventricular tachycardia) ([place]) 1996  . Vitamin D deficiency 03/02/2015  . 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 Gabriel Carina endo --> 02/2016 decided to return to PCP for DM care    Past Surgical History:  Procedure Laterality Date  . CARDIAC CATHETERIZATION  July 2010   No blockages (Dr. Liliane Shi)  . COLONOSCOPY  09/16/2005   hyperplastic polyps, rpt due 10 yrs   . COLONOSCOPY WITH PROPOFOL N/A 12/11/2015   mult polyps, few TA, rpt 67yrs  ([place])  . corrective surgery amblyopia  1957  . Cystectomy or meningioma removal brain  1990   (unclear)  . Left knee surgery  1971   Torn ACL  . REPLACEMENT TOTAL KNEE  April 2011   Left [facility] Dr. Garald Balding)    There were no vitals filed for this visit.   Subjective Assessment - 10/27/20 1103    Subjective Patient does not report any significant changes or falls since last visit. Notes increased knee pain today.    Pertinent History Patient familiar to clinic as he was seen previously for BLE weakness. Patient achieved goals with last course of PT and was able to self-manage. He notes he has maintained his level of strength and is still able to stand from couch in family room/den without complaint.    Limitations Lifting;Standing;Walking;House hold activities    How long can you sit comfortably? unlimited    How long can you stand comfortably? 30 min (9/10 pain)    How long can you walk comfortably? 30 min (9/10 pain)    Patient Stated Goals "work the back out"    Currently in Pain? Yes    Pain Score 7     Pain Location Knee    Pain Orientation Left;Right           TREATMENT Therapeutic Exercise: NuStep,  L2-0, seat 15, x5 min, SPM target 70-80 for improved activity tolerance (during history) Blue TB BUE strengthening for improved ability to floor transfer:  Bicep curls, x15  Rows, x15  Lat pull down, x15  Abduction, x15  PNF D2, x12 Cryotherapy applied to B knees in sitting during postural strengthening exercises for pain modulation. Gait in hallway with 4-6" obstacles, SBA for improved hip flexion and foot clearance  Patient educated throughout session on appropriate technique and form using multi-modal cueing, HEP, and activity modification.   Patient Response to interventions: Patient discontinued session 2/2 to knee pain.  ASSESSMENT Patient presents to clinic with excellent motivation to participate in therapy. Patient demonstrates deficits in gait, posture,  pain, and falls risk. Patient able to participate in postural strengthening activities during today's session and patient responded positively to active interventions despite 7/10 knee pain that was not modified by rest nor cryotherapy. Patient will benefit from continued skilled therapeutic intervention to address remaining deficits in gait, posture, pain, and falls risk in order to increase function and improve overall QOL.     PT Long Term Goals - 10/20/20 1058      PT LONG TERM GOAL #1   Title Patient will be independent with HEP in order to improve strength and balance in order to decrease fall risk and improve function at home and work.    Baseline IE: none provided at this time; 1/27: IND    Time 6    Period Weeks    Status Achieved      PT LONG TERM GOAL #2   Title Patient will improve DGI by at least 3 points in order to demonstrate clinically significant improvement in balance and decreased risk for falls.    Baseline IE: to be assessed at next session; 12/23: 12; 2/1: 14; 3/1: 18    Time 8    Period Weeks    Status On-going    Target Date 12/15/20      PT LONG TERM GOAL #3   Title Patient will decrease 5TSTS by at least 3 seconds without UE support and from 17-18" seat height in order to demonstrate clinically significant improvement in LE strength..    Baseline IE: 18.5 sec from 23.5" seat; 1/27: 16.8 sec from 23.5 " seat; 2/1: 25.1 sec from 20" seat, BUE support; 3/1: 28.2 sec from 22" surface    Time 8    Period Weeks    Status On-going    Target Date 12/15/20      PT LONG TERM GOAL #4   Title Patient will increase 10 meter walk test to >1.38m/s as to improve gait speed for better community ambulation and to reduce fall risk.    Baseline IE: 0.40m/s, SPC (self-selected); 2/1: 0.88m/s; 3/1: 0.87 m/s with SPC, 0.69 m/s without    Time 8    Period Weeks    Status On-going    Target Date 12/15/20      PT LONG TERM GOAL #5   Title Patient will demonstrate improved  function as evidenced by a score of 65 on FOTO measure for full participation in activities at home and in the community.    Baseline IE: 41; 2/1: 62; 3/1: 63    Time 8    Period Weeks    Status On-going    Target Date 12/15/20                 Plan - 10/27/20 1109    Clinical Impression Statement Patient presents  to clinic with excellent motivation to participate in therapy. Patient demonstrates deficits in gait, posture, pain, and falls risk. Patient able to participate in postural strengthening activities during today's session and patient responded positively to active interventions despite 7/10 knee pain that was not modified by rest nor cryotherapy. Patient will benefit from continued skilled therapeutic intervention to address remaining deficits in gait, posture, pain, and falls risk in order to increase function and improve overall QOL.    Personal Factors and Comorbidities Comorbidity 3+;Fitness;Time since onset of injury/illness/exacerbation;Past/Current Experience    Comorbidities HTN, DM, HLD,    Examination-Activity Limitations Lift;Bend;Squat;Transfers;Stand;Stairs;Carry;Reach Overhead;Locomotion Level    Examination-Participation Restrictions Interpersonal Relationship;Yard Work;Laundry;Cleaning;Community Activity;Shop    Stability/Clinical Decision Making Evolving/Moderate complexity    Rehab Potential Fair    PT Frequency 2x / week    PT Duration 8 weeks    PT Treatment/Interventions ADLs/Self Care Home Management;Moist Heat;Electrical Stimulation;Cryotherapy;DME Instruction;Therapeutic activities;Gait training;Stair training;Therapeutic exercise;Balance training;Neuromuscular re-education;Functional mobility training;Energy conservation;Manual techniques;Patient/family education;Orthotic Fit/Training    Consulted and Agree with Plan of Care Patient           Patient will benefit from skilled therapeutic intervention in order to improve the following deficits and  impairments:  Abnormal gait,Decreased balance,Pain,Postural dysfunction,Decreased coordination,Decreased activity tolerance,Improper body mechanics,Difficulty walking,Decreased mobility,Decreased endurance  Visit Diagnosis: Difficulty in walking, not elsewhere classified  Abnormal posture  Muscle weakness (generalized)  Repeated falls     Problem List Patient Active Problem List   Diagnosis Date Noted  . BPH (benign prostatic hyperplasia) 10/03/2020  . Pain due to onychomycosis of toenails of both feet 06/18/2020  . Rash of foot 01/30/2020  . Stage 3 chronic kidney disease ([place]) 10/23/2019  . Type 2 diabetes mellitus with diabetic neuropathy, unspecified ([place]) 10/21/2019  . Impaired gait and mobility 10/14/2019  . Postural dizziness with presyncope 10/13/2019  . Ventricular premature complexes 10/13/2019  . Urge urinary incontinence 09/26/2019  . Depressed mood 09/26/2019  . Advanced care planning/counseling discussion 09/22/2018  . Weakness of both lower extremities 09/22/2018  . History of hepatitis 01/05/2018  . 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 NAFLD (nonalcoholic fatty liver disease) 09/30/2017  . Obesity, Class I, BMI 30.0-34.9 (see actual BMI) 09/18/2017  . Polyarthralgia 02/15/2017  . OSA (obstructive sleep apnea) 02/15/2017  . Partial epilepsy with impairment of consciousness ([place]) 01/29/2016  . Low vitamin B12 level 05/27/2015  . Elevated PSA 05/27/2015  . Vitamin D deficiency 03/02/2015  . Other long term (current) drug therapy 03/12/2012  . Encounter for general adult medical examination with abnormal findings 04/26/2011  . TOTAL KNEE REPLACEMENT, LEFT, HX OF 04/07/2010  . Type 2 diabetes mellitus with diabetic nephropathy ([place]) 03/30/2010  . Hyperlipidemia associated with type 2 diabetes mellitus ([place]) 03/30/2010  . 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 Essential hypertension, benign 03/30/2010    Myles Gip PT, DPT [phone]  10/27/2020, 12:42 PM  [facility] [address] [place], [place], [address] Phone: [phone]   Fax:  [phone]  Name: [name] MRN: [id number] Date of Birth: Dec 23, 1950

## 2020-10-29 ENCOUNTER — Ambulatory Visit: Payer: 59 | Admitting: Physical Therapy

## 2020-11-02 ENCOUNTER — Encounter: Payer: 59 | Admitting: Physical Therapy

## 2020-11-03 ENCOUNTER — Ambulatory Visit: Payer: 59 | Admitting: Physical Therapy

## 2020-11-05 ENCOUNTER — Encounter: Payer: Self-pay | Admitting: Physical Therapy

## 2020-11-05 ENCOUNTER — Other Ambulatory Visit: Payer: Self-pay

## 2020-11-05 ENCOUNTER — Ambulatory Visit: Payer: 59 | Admitting: Physical Therapy

## 2020-11-05 DIAGNOSIS — R262 Difficulty in walking, not elsewhere classified: Secondary | ICD-10-CM | POA: Diagnosis not present

## 2020-11-05 DIAGNOSIS — R293 Abnormal posture: Secondary | ICD-10-CM

## 2020-11-05 DIAGNOSIS — M6281 Muscle weakness (generalized): Secondary | ICD-10-CM

## 2020-11-05 DIAGNOSIS — R296 Repeated falls: Secondary | ICD-10-CM

## 2020-11-05 NOTE — Therapy (Signed)
[facility] [address]. [place], [place], [address] Phone: [phone]   Fax:  [phone]  Physical Therapy Treatment Physical Therapy Progress Note   Dates of reporting period  09/17/2020   to   11/05/2020   Patient Details  Name: Kirk Bennett. MRN: [id number] Date of Birth: [birth year] Referring Provider (PT): Holley Raring, [place]   Encounter Date: 11/05/2020   PT End of Session - 11/05/20 1241    Visit Number 20    Number of Visits 37    Date for PT Re-Evaluation 12/15/20    Authorization Type PN 11/05/2020    PT Start Time 1100    PT Stop Time 1130    PT Time Calculation (min) 30 min    Equipment Utilized During Treatment Gait belt    Activity Tolerance Patient tolerated treatment well;Patient limited by fatigue    Behavior During Therapy WFL for tasks assessed/performed           Past Medical History:  Diagnosis Date  . Cataracts, bilateral 02/2011   and suspected glaucoma, to return for f/u, no diabetic retinopathy  . CKD stage 3 due to type 2 diabetes mellitus ([place]) 2015   normal renal US, self referred to Dr Holley Raring  . 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 Mora Bellman at [facility])  . Depression    found by neuro  . Elevated PSA 05/27/2015   Serial monitoring ([place])   . Glaucoma 2015   suspect  . History of hepatitis A   . HLD (hyperlipidemia)   . HTN (hypertension)   . Knee pain    s/p replacement  . SVT (supraventricular tachycardia) ([place]) 1996  . Vitamin D deficiency 03/02/2015  . 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 Gabriel Carina endo --> 02/2016 decided to return to PCP for DM care    Past Surgical History:  Procedure Laterality Date  . CARDIAC CATHETERIZATION  July 2010   No blockages (Dr. Liliane Shi)  . COLONOSCOPY  09/16/2005   hyperplastic  polyps, rpt due 10 yrs   . COLONOSCOPY WITH PROPOFOL N/A 12/11/2015   mult polyps, few TA, rpt 54yrs ([place])  . corrective surgery amblyopia  1957  . Cystectomy or meningioma removal brain  1990   (unclear)  . Left knee surgery  1971   Torn ACL  . REPLACEMENT TOTAL KNEE  April 2011   Left [facility] Dr. Garald Balding)    There were no vitals filed for this visit.   Subjective Assessment - 11/05/20 1242    Subjective Patient noting increased fatigue day. Patient had difficulty with rising from chair in waiting room, but notes this may be from sitting too long in the waiting area. Patient does not report any siginificant changes or falls since last session.    Pertinent History Patient familiar to clinic as he was seen previously for BLE weakness. Patient achieved goals with last course of PT and was able to self-manage. He notes he has maintained his level of strength and is still able to stand from couch in family room/den without complaint.    Limitations Lifting;Standing;Walking;House hold activities    How long can you sit comfortably? unlimited    How long can you stand comfortably? 30 min (9/10 pain)    How long can you walk comfortably? 30 min (9/10 pain)    Patient  Stated Goals "work the back out"    Currently in Pain? No/denies          TREATMENT Therapeutic Exercise: NuStep, L1, seat 15, x5 min, SPM target 70-80 for improved activity tolerance (during history) 3# CW strengthening:   LAQ 2x10  Step taps, 2x10  Alternating step taps 2x10  Neuromuscular Re-education: Gait in hallway with cues for large amplitude strides, SPC, SBA, VCsfor improved hip flexion and foot clearance with arm swing Patient unable ot participate in goal reassessment 2/2 high level of fatigue.  Patient educated throughout session on appropriate technique and form using multi-modal cueing, HEP, and activity modification.   Patient Response to interventions: Patient discontinued session 2/2  fatigue  ASSESSMENT Patient presents to clinic with excellent motivation to participate in therapy. Patient demonstrates deficits in gait, posture, pain, and falls risk. Patient had limited tolerance for activity with high level of fatigue during today's session but responded to arm swing cueing with gait and was able to prevent shuffling. Patient's condition has the potential to improve in response to therapy. Maximum improvement is yet to be obtained. The anticipated improvement is attainable and reasonable in a generally predictable time. Patient will benefit from continued skilled therapeutic intervention to address remaining deficits in gait, posture, pain, and falls risk in order to increase function and improve overall QOL.     PT Long Term Goals - 10/20/20 1058      PT LONG TERM GOAL #1   Title Patient will be independent with HEP in order to improve strength and balance in order to decrease fall risk and improve function at home and work.    Baseline IE: none provided at this time; 1/27: IND    Time 6    Period Weeks    Status Achieved      PT LONG TERM GOAL #2   Title Patient will improve DGI by at least 3 points in order to demonstrate clinically significant improvement in balance and decreased risk for falls.    Baseline IE: to be assessed at next session; 12/23: 12; 2/1: 14; 3/1: 18    Time 8    Period Weeks    Status On-going    Target Date 12/15/20      PT LONG TERM GOAL #3   Title Patient will decrease 5TSTS by at least 3 seconds without UE support and from 17-18" seat height in order to demonstrate clinically significant improvement in LE strength..    Baseline IE: 18.5 sec from 23.5" seat; 1/27: 16.8 sec from 23.5 " seat; 2/1: 25.1 sec from 20" seat, BUE support; 3/1: 28.2 sec from 22" surface    Time 8    Period Weeks    Status On-going    Target Date 12/15/20      PT LONG TERM GOAL #4   Title Patient will increase 10 meter walk test to >1.24m/s as to improve gait  speed for better community ambulation and to reduce fall risk.    Baseline IE: 0.22m/s, SPC (self-selected); 2/1: 0.50m/s; 3/1: 0.87 m/s with SPC, 0.69 m/s without    Time 8    Period Weeks    Status On-going    Target Date 12/15/20      PT LONG TERM GOAL #5   Title Patient will demonstrate improved function as evidenced by a score of 65 on FOTO measure for full participation in activities at home and in the community.    Baseline IE: 41; 2/1: 61; 3/1: 63  Time 8    Period Weeks    Status On-going    Target Date 12/15/20                 Plan - 11/05/20 1242    Clinical Impression Statement Patient presents to clinic with excellent motivation to participate in therapy. Patient demonstrates deficits in gait, posture, pain, and falls risk. Patient had limited tolerance for activity with high level of fatigue during today's session but responded to arm swing cueing with gait and was able to prevent shuffling. Patient's condition has the potential to improve in response to therapy. Maximum improvement is yet to be obtained. The anticipated improvement is attainable and reasonable in a generally predictable time. Patient will benefit from continued skilled therapeutic intervention to address remaining deficits in gait, posture, pain, and falls risk in order to increase function and improve overall QOL.    Personal Factors and Comorbidities Comorbidity 3+;Fitness;Time since onset of injury/illness/exacerbation;Past/Current Experience    Comorbidities HTN, DM, HLD,    Examination-Activity Limitations Lift;Bend;Squat;Transfers;Stand;Stairs;Carry;Reach Overhead;Locomotion Level    Examination-Participation Restrictions Interpersonal Relationship;Yard Work;Laundry;Cleaning;Community Activity;Shop    Stability/Clinical Decision Making Evolving/Moderate complexity    Rehab Potential Fair    PT Frequency 2x / week    PT Duration 8 weeks    PT Treatment/Interventions ADLs/Self Care Home  Management;Moist Heat;Electrical Stimulation;Cryotherapy;DME Instruction;Therapeutic activities;Gait training;Stair training;Therapeutic exercise;Balance training;Neuromuscular re-education;Functional mobility training;Energy conservation;Manual techniques;Patient/family education;Orthotic Fit/Training    Consulted and Agree with Plan of Care Patient           Patient will benefit from skilled therapeutic intervention in order to improve the following deficits and impairments:  Abnormal gait,Decreased balance,Pain,Postural dysfunction,Decreased coordination,Decreased activity tolerance,Improper body mechanics,Difficulty walking,Decreased mobility,Decreased endurance  Visit Diagnosis: Difficulty in walking, not elsewhere classified  Abnormal posture  Muscle weakness (generalized)  Repeated falls     Problem List Patient Active Problem List   Diagnosis Date Noted  . BPH (benign prostatic hyperplasia) 10/03/2020  . Pain due to onychomycosis of toenails of both feet 06/18/2020  . Rash of foot 01/30/2020  . Stage 3 chronic kidney disease ([place]) 10/23/2019  . Type 2 diabetes mellitus with diabetic neuropathy, unspecified ([place]) 10/21/2019  . Impaired gait and mobility 10/14/2019  . Postural dizziness with presyncope 10/13/2019  . Ventricular premature complexes 10/13/2019  . Urge urinary incontinence 09/26/2019  . Depressed mood 09/26/2019  . Advanced care planning/counseling discussion 09/22/2018  . Weakness of both lower extremities 09/22/2018  . History of hepatitis 01/05/2018  . 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 NAFLD (nonalcoholic fatty liver disease) 09/30/2017  . Obesity, Class I, BMI 30.0-34.9 (see actual BMI) 09/18/2017  . Polyarthralgia 02/15/2017  . OSA (obstructive sleep apnea) 02/15/2017  . Partial epilepsy with impairment of consciousness ([place]) 01/29/2016  . Low vitamin B12 level 05/27/2015  . Elevated PSA 05/27/2015  . Vitamin D deficiency  03/02/2015  . Other long term (current) drug therapy 03/12/2012  . Encounter for general adult medical examination with abnormal findings 04/26/2011  . TOTAL KNEE REPLACEMENT, LEFT, HX OF 04/07/2010  . Type 2 diabetes mellitus with diabetic nephropathy ([place]) 03/30/2010  . Hyperlipidemia associated with type 2 diabetes mellitus ([place]) 03/30/2010  . 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 Essential hypertension, benign 03/30/2010   Myles Gip PT, DPT [phone]  11/05/2020, 12:54 PM  [facility] [address] [place], [place], [address] Phone: [phone]   Fax:  [phone]  Name: Kirk Both  Brooke Bennett MRN: [id number] Date of Birth: 1951/06/13

## 2020-11-06 ENCOUNTER — Other Ambulatory Visit: Payer: Self-pay | Admitting: Family Medicine

## 2020-11-10 ENCOUNTER — Ambulatory Visit: Payer: 59 | Admitting: Physical Therapy

## 2020-11-12 ENCOUNTER — Ambulatory Visit: Payer: 59 | Admitting: Physical Therapy

## 2020-11-17 ENCOUNTER — Ambulatory Visit: Payer: 59 | Admitting: Physical Therapy

## 2020-11-17 ENCOUNTER — Encounter: Payer: Self-pay | Admitting: Physical Therapy

## 2020-11-17 ENCOUNTER — Other Ambulatory Visit: Payer: Self-pay

## 2020-11-17 DIAGNOSIS — R293 Abnormal posture: Secondary | ICD-10-CM

## 2020-11-17 DIAGNOSIS — R262 Difficulty in walking, not elsewhere classified: Secondary | ICD-10-CM

## 2020-11-17 DIAGNOSIS — M6281 Muscle weakness (generalized): Secondary | ICD-10-CM

## 2020-11-17 DIAGNOSIS — R296 Repeated falls: Secondary | ICD-10-CM

## 2020-11-17 NOTE — Therapy (Signed)
[facility] [address] [place], [place], [address] Phone: [phone]   Fax:  [phone]  Physical Therapy Treatment  Patient Details  Name: Kirk Bennett. MRN: [id number] Date of Birth: [birth year] Referring Provider (PT): Holley Raring, [place]   Encounter Date: 11/17/2020   PT End of Session - 11/17/20 1152    Visit Number 21    Number of Visits 37    Date for PT Re-Evaluation 12/15/20    Authorization Type PN 11/05/2020    PT Start Time 1045    PT Stop Time 1115    PT Time Calculation (min) 30 min    Equipment Utilized During Treatment Gait belt    Activity Tolerance Patient tolerated treatment well;Patient limited by fatigue    Behavior During Therapy WFL for tasks assessed/performed           Past Medical History:  Diagnosis Date  . Cataracts, bilateral 02/2011   and suspected glaucoma, to return for f/u, no diabetic retinopathy  . CKD stage 3 due to type 2 diabetes mellitus ([place]) 2015   normal renal US, self referred to Dr Holley Raring  . 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 Mora Bellman at [facility])  . Depression    found by neuro  . Elevated PSA 05/27/2015   Serial monitoring ([place])   . Glaucoma 2015   suspect  . History of hepatitis A   . HLD (hyperlipidemia)   . HTN (hypertension)   . Knee pain    s/p replacement  . SVT (supraventricular tachycardia) ([place]) 1996  . Vitamin D deficiency 03/02/2015  . 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 Gabriel Carina endo --> 02/2016 decided to return to PCP for DM care    Past Surgical History:  Procedure Laterality Date  . CARDIAC CATHETERIZATION  July 2010   No blockages (Dr. Liliane Shi)  . COLONOSCOPY  09/16/2005   hyperplastic polyps, rpt due 10 yrs   . COLONOSCOPY WITH PROPOFOL N/A 12/11/2015   mult polyps, few TA, rpt  52yrs ([place])  . corrective surgery amblyopia  1957  . Cystectomy or meningioma removal brain  1990   (unclear)  . Left knee surgery  1971   Torn ACL  . REPLACEMENT TOTAL KNEE  April 2011   Left [facility] Dr. Garald Balding)    There were no vitals filed for this visit.   Subjective Assessment - 11/17/20 1150    Subjective Patient reports that he heard back about the MRI of his brain and the results were not concerning. Patient notes that he is feeling good today. He states that he is ready to end this course of PT at Thursday's appointment and manage with his HEP.    Pertinent History Patient familiar to clinic as he was seen previously for BLE weakness. Patient achieved goals with last course of PT and was able to self-manage. He notes he has maintained his level of strength and is still able to stand from couch in family room/den without complaint.    Limitations Lifting;Standing;Walking;House hold activities    How long can you sit comfortably? unlimited    How long can you stand comfortably? 30 min (9/10 pain)    How long can you walk comfortably? 30 min (9/10 pain)    Patient Stated Goals "work the back out"    Currently in Pain? No/denies  TREATMENT Therapeutic Exercise: NuStep, L1, seat 15, x5 min, SPM target 70-80 for improved activity tolerance (during history) STS from elevated surface x5  Neuromuscular Re-education: Gait in hallway with cues for large amplitude strides, SPC, SBA, VCsfor improved hip flexion and foot clearance with arm swing Gait in hallway with obstacle negotiation, variable surfaces, and step overs. SPC faded to no AD. Intermittent cues for stride and gaze.   Patient educated throughout session on appropriate technique and form using multi-modal cueing, HEP, and activity modification.   Patient Response to interventions: Patient discontinued session 2/2 fatigue  ASSESSMENT Patient presents to clinic with excellent motivation to participate in  therapy. Patient demonstrates deficits in gait, posture, pain, and falls risk. Patient had no LOB with gait in hallway, but limited endurance during today's session and responded to arm swing cueing with gait and was able to prevent shuffling. Patient will benefit from continued skilled therapeutic intervention to address remaining deficits in gait, posture, pain, and falls risk in order to increase function and improve overall QOL.      PT Long Term Goals - 10/20/20 1058      PT LONG TERM GOAL #1   Title Patient will be independent with HEP in order to improve strength and balance in order to decrease fall risk and improve function at home and work.    Baseline IE: none provided at this time; 1/27: IND    Time 6    Period Weeks    Status Achieved      PT LONG TERM GOAL #2   Title Patient will improve DGI by at least 3 points in order to demonstrate clinically significant improvement in balance and decreased risk for falls.    Baseline IE: to be assessed at next session; 12/23: 12; 2/1: 14; 3/1: 18    Time 8    Period Weeks    Status On-going    Target Date 12/15/20      PT LONG TERM GOAL #3   Title Patient will decrease 5TSTS by at least 3 seconds without UE support and from 17-18" seat height in order to demonstrate clinically significant improvement in LE strength..    Baseline IE: 18.5 sec from 23.5" seat; 1/27: 16.8 sec from 23.5 " seat; 2/1: 25.1 sec from 20" seat, BUE support; 3/1: 28.2 sec from 22" surface    Time 8    Period Weeks    Status On-going    Target Date 12/15/20      PT LONG TERM GOAL #4   Title Patient will increase 10 meter walk test to >1.24m/s as to improve gait speed for better community ambulation and to reduce fall risk.    Baseline IE: 0.54m/s, SPC (self-selected); 2/1: 0.60m/s; 3/1: 0.87 m/s with SPC, 0.69 m/s without    Time 8    Period Weeks    Status On-going    Target Date 12/15/20      PT LONG TERM GOAL #5   Title Patient will demonstrate  improved function as evidenced by a score of 65 on FOTO measure for full participation in activities at home and in the community.    Baseline IE: 30; 2/1: 51; 3/1: 63    Time 8    Period Weeks    Status On-going    Target Date 12/15/20                 Plan - 11/17/20 1152    Clinical Impression Statement Patient presents to clinic with excellent  motivation to participate in therapy. Patient demonstrates deficits in gait, posture, pain, and falls risk. Patient had no LOB with gait in hallway, but limited endurance during today's session and responded to arm swing cueing with gait and was able to prevent shuffling. Patient will benefit from continued skilled therapeutic intervention to address remaining deficits in gait, posture, pain, and falls risk in order to increase function and improve overall QOL.    Personal Factors and Comorbidities Comorbidity 3+;Fitness;Time since onset of injury/illness/exacerbation;Past/Current Experience    Comorbidities HTN, DM, HLD,    Examination-Activity Limitations Lift;Bend;Squat;Transfers;Stand;Stairs;Carry;Reach Overhead;Locomotion Level    Examination-Participation Restrictions Interpersonal Relationship;Yard Work;Laundry;Cleaning;Community Activity;Shop    Stability/Clinical Decision Making Evolving/Moderate complexity    Rehab Potential Fair    PT Frequency 2x / week    PT Duration 8 weeks    PT Treatment/Interventions ADLs/Self Care Home Management;Moist Heat;Electrical Stimulation;Cryotherapy;DME Instruction;Therapeutic activities;Gait training;Stair training;Therapeutic exercise;Balance training;Neuromuscular re-education;Functional mobility training;Energy conservation;Manual techniques;Patient/family education;Orthotic Fit/Training    Consulted and Agree with Plan of Care Patient           Patient will benefit from skilled therapeutic intervention in order to improve the following deficits and impairments:  Abnormal gait,Decreased  balance,Pain,Postural dysfunction,Decreased coordination,Decreased activity tolerance,Improper body mechanics,Difficulty walking,Decreased mobility,Decreased endurance  Visit Diagnosis: Difficulty in walking, not elsewhere classified  Abnormal posture  Muscle weakness (generalized)  Repeated falls     Problem List Patient Active Problem List   Diagnosis Date Noted  . BPH (benign prostatic hyperplasia) 10/03/2020  . Pain due to onychomycosis of toenails of both feet 06/18/2020  . Rash of foot 01/30/2020  . Stage 3 chronic kidney disease ([place]) 10/23/2019  . Type 2 diabetes mellitus with diabetic neuropathy, unspecified ([place]) 10/21/2019  . Impaired gait and mobility 10/14/2019  . Postural dizziness with presyncope 10/13/2019  . Ventricular premature complexes 10/13/2019  . Urge urinary incontinence 09/26/2019  . Depressed mood 09/26/2019  . Advanced care planning/counseling discussion 09/22/2018  . Weakness of both lower extremities 09/22/2018  . History of hepatitis 01/05/2018  . 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 NAFLD (nonalcoholic fatty liver disease) 09/30/2017  . Obesity, Class I, BMI 30.0-34.9 (see actual BMI) 09/18/2017  . Polyarthralgia 02/15/2017  . OSA (obstructive sleep apnea) 02/15/2017  . Partial epilepsy with impairment of consciousness ([place]) 01/29/2016  . Low vitamin B12 level 05/27/2015  . Elevated PSA 05/27/2015  . Vitamin D deficiency 03/02/2015  . Other long term (current) drug therapy 03/12/2012  . Encounter for general adult medical examination with abnormal findings 04/26/2011  . TOTAL KNEE REPLACEMENT, LEFT, HX OF 04/07/2010  . Type 2 diabetes mellitus with diabetic nephropathy ([place]) 03/30/2010  . Hyperlipidemia associated with type 2 diabetes mellitus ([place]) 03/30/2010  . 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 Essential hypertension, benign 03/30/2010   Myles Gip PT, DPT [phone]   11/17/2020, 12:31 PM  [facility] [address] [place], [place], [address] Phone: [phone]   Fax:  [phone]  Name: Kirk Bennett. MRN: [id number] Date of Birth: [birth year]

## 2020-11-18 ENCOUNTER — Telehealth: Payer: Self-pay | Admitting: Family Medicine

## 2020-11-18 ENCOUNTER — Other Ambulatory Visit: Payer: Self-pay | Admitting: Family Medicine

## 2020-11-18 MED ORDER — ATORVASTATIN CALCIUM 20 MG PO TABS: 20.0000 mg | ORAL_TABLET | Freq: Every day | ORAL | 3 refills | Status: DC

## 2020-11-18 NOTE — Telephone Encounter (Signed)
E-scribed refill 

## 2020-11-18 NOTE — Telephone Encounter (Signed)
  LAST APPOINTMENT DATE: 11/18/2020   NEXT APPOINTMENT DATE:@5 /13/2022  MEDICATION: lipitor   PHARMACY: [facility] [address](standalone)  Let patient know to contact pharmacy at the end of the day to make sure medication is ready.  Please notify patient to allow 48-72 hours to process  Encourage patient to contact the pharmacy for refills or they can request refills through [place]:   LAST REFILL:  QTY:  REFILL DATE:    OTHER COMMENTS:    Okay for refill?  Please advise

## 2020-11-19 ENCOUNTER — Other Ambulatory Visit: Payer: Self-pay

## 2020-11-19 ENCOUNTER — Ambulatory Visit: Payer: 59 | Admitting: Physical Therapy

## 2020-11-19 DIAGNOSIS — R293 Abnormal posture: Secondary | ICD-10-CM

## 2020-11-19 DIAGNOSIS — M6281 Muscle weakness (generalized): Secondary | ICD-10-CM

## 2020-11-19 DIAGNOSIS — R262 Difficulty in walking, not elsewhere classified: Secondary | ICD-10-CM | POA: Diagnosis not present

## 2020-11-19 NOTE — Therapy (Signed)
[facility] [address] [place], [place], [address] Phone: [phone]   Fax:  [phone]  Physical Therapy Treatment/Discharge  Patient Details  Name: Kirk Bennett. MRN: [id number] Date of Birth: October 05, 1950 Referring Provider (PT): Holley Raring, [place]   Encounter Date: 11/19/2020   PT End of Session - 11/19/20 1117    Visit Number 22    Number of Visits 37    Date for PT Re-Evaluation 12/15/20    Authorization Type PN 11/05/2020    PT Start Time 1050    PT Stop Time 1105    PT Time Calculation (min) 15 min    Equipment Utilized During Treatment Gait belt    Activity Tolerance Patient tolerated treatment well;Patient limited by fatigue    Behavior During Therapy WFL for tasks assessed/performed           Past Medical History:  Diagnosis Date  . Cataracts, bilateral 02/2011   and suspected glaucoma, to return for f/u, no diabetic retinopathy  . CKD stage 3 due to type 2 diabetes mellitus ([place]) 2015   normal renal US, self referred to Dr Holley Raring  . 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 Mora Bellman at [facility])  . Depression    found by neuro  . Elevated PSA 05/27/2015   Serial monitoring ([place])   . Glaucoma 2015   suspect  . History of hepatitis A   . HLD (hyperlipidemia)   . HTN (hypertension)   . Knee pain    s/p replacement  . SVT (supraventricular tachycardia) ([place]) 1996  . Vitamin D deficiency 03/02/2015  . 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 Gabriel Carina endo --> 02/2016 decided to return to PCP for DM care    Past Surgical History:  Procedure Laterality Date  . CARDIAC CATHETERIZATION  July 2010   No blockages (Dr. Liliane Shi)  . COLONOSCOPY  09/16/2005   hyperplastic polyps, rpt due 10 yrs   . COLONOSCOPY WITH PROPOFOL N/A 12/11/2015   mult polyps, few  TA, rpt 69yr ([place])  . corrective surgery amblyopia  1957  . Cystectomy or meningioma removal brain  1990   (unclear)  . Left knee surgery  1971   Torn ACL  . REPLACEMENT TOTAL KNEE  April 2011   [facility]    There were no vitals filed for this visit.   Subjective Assessment - 11/19/20 1115    Subjective Patient states that he continues to feel confident about transitioning to self-managment. He notes he almost didn't attend today because of the severe weather and if possible would like to keep the session short.    Pertinent History Patient familiar to clinic as he was seen previously for BLE weakness. Patient achieved goals with last course of PT and was able to self-manage. He notes he has maintained his level of strength and is still able to stand from couch in family room/den without complaint.    Limitations Lifting;Standing;Walking;House hold activities    How long can you sit comfortably? unlimited    How long can you stand comfortably? 30 min (9/10 pain)    How long can you walk comfortably? 30 min (9/10 pain)    Patient Stated Goals "work the back out"    Currently in Pain? No/denies  [facility] - 11/19/20 1112      Dynamic Gait Index   Level Surface Mild Impairment    Change in Gait Speed Mild Impairment    Gait with Horizontal Head Turns Normal    Gait with Vertical Head Turns Normal    Gait and Pivot Turn Normal    Step Over Obstacle Normal    Step Around Obstacles Normal    Steps Moderate Impairment    Total Score 20          TREATMENT Neuromuscular Re-education: Reassessed goals; see below. Patient education on layering HEP into ADLs.  Patient educated throughout session on appropriate technique and form using multi-modal cueing, HEP, and activity modification.   Patient Response to interventions: Patient comfortable to discharge.  ASSESSMENT Patient presents to clinic with excellent motivation to participate  in therapy. Patient demonstrates deficits in gait, posture, pain, and falls risk. Patient has achieved majority of goals and is satisfied with his progress in physical therapy. At this time he is appropriate for discharge to self-management of gait, posture, pain, and falls risk in order to maintain function and QOL.     PT Long Term Goals - 11/19/20 1101      PT LONG TERM GOAL #1   Title Patient will be independent with HEP in order to improve strength and balance in order to decrease fall risk and improve function at home and work.    Baseline IE: none provided at this time; 1/27: IND    Time 6    Period Weeks    Status Achieved      PT LONG TERM GOAL #2   Title Patient will improve DGI by at least 3 points in order to demonstrate clinically significant improvement in balance and decreased risk for falls.    Baseline IE: to be assessed at next session; 12/23: 12; 2/1: 14; 3/1: 18; 3/31: 20    Time 8    Period Weeks    Status Achieved      PT LONG TERM GOAL #3   Title Patient will decrease 5TSTS by at least 3 seconds without UE support and from 17-18" seat height in order to demonstrate clinically significant improvement in LE strength..    Baseline IE: 18.5 sec from 23.5" seat; 1/27: 16.8 sec from 23.5 " seat; 2/1: 25.1 sec from 20" seat, BUE support; 3/1: 28.2 sec from 22" surface; 3/31: 17.74 sec from 22" surface no UE    Time 8    Period Weeks    Status Not Met      PT LONG TERM GOAL #4   Title Patient will increase 10 meter walk test to >1.56ms as to improve gait speed for better community ambulation and to reduce fall risk.    Baseline IE: 0.373m, SPC (self-selected); 2/1: 0.5185m 3/1: 0.87 m/s with SPC, 0.69 m/s without; 3/1: 1.12 m/s with SPC,    Time 8    Period Weeks    Status Achieved      PT LONG TERM GOAL #5   Title Patient will demonstrate improved function as evidenced by a score of 65 on FOTO measure for full participation in activities at home and in the  community.    Baseline IE: 54; 2/1: 61; 3/1: 63; 3/31: 72    Time 8    Period Weeks    Status Achieved                 Plan - 11/19/20 1117  Clinical Impression Statement Patient presents to clinic with excellent motivation to participate in therapy. Patient demonstrates deficits in gait, posture, pain, and falls risk. Patient has achieved majority of goals and is satisfied with his progress in physical therapy. At this time he is appropriate for discharge to self-management of gait, posture, pain, and falls risk in order to maintain function and QOL.    Personal Factors and Comorbidities Comorbidity 3+;Fitness;Time since onset of injury/illness/exacerbation;Past/Current Experience    Comorbidities HTN, DM, HLD,    Examination-Activity Limitations Lift;Bend;Squat;Transfers;Stand;Stairs;Carry;Reach Overhead;Locomotion Level    Examination-Participation Restrictions Interpersonal Relationship;Yard Work;Laundry;Cleaning;Community Activity;Shop    Stability/Clinical Decision Making Evolving/Moderate complexity    Rehab Potential Fair    PT Frequency 2x / week    PT Duration 8 weeks    PT Treatment/Interventions ADLs/Self Care Home Management;Moist Heat;Electrical Stimulation;Cryotherapy;DME Instruction;Therapeutic activities;Gait training;Stair training;Therapeutic exercise;Balance training;Neuromuscular re-education;Functional mobility training;Energy conservation;Manual techniques;Patient/family education;Orthotic Fit/Training    Consulted and Agree with Plan of Care Patient           Patient will benefit from skilled therapeutic intervention in order to improve the following deficits and impairments:  Abnormal gait,Decreased balance,Pain,Postural dysfunction,Decreased coordination,Decreased activity tolerance,Improper body mechanics,Difficulty walking,Decreased mobility,Decreased endurance  Visit Diagnosis: Difficulty in walking, not elsewhere classified  Abnormal  posture  Muscle weakness (generalized)     Problem List Patient Active Problem List   Diagnosis Date Noted  . BPH (benign prostatic hyperplasia) 10/03/2020  . Pain due to onychomycosis of toenails of both feet 06/18/2020  . Rash of foot 01/30/2020  . Stage 3 chronic kidney disease ([place]) 10/23/2019  . Type 2 diabetes mellitus with diabetic neuropathy, unspecified ([place]) 10/21/2019  . Impaired gait and mobility 10/14/2019  . Postural dizziness with presyncope 10/13/2019  . Ventricular premature complexes 10/13/2019  . Urge urinary incontinence 09/26/2019  . Depressed mood 09/26/2019  . Advanced care planning/counseling discussion 09/22/2018  . Weakness of both lower extremities 09/22/2018  . History of hepatitis 01/05/2018  . 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 NAFLD (nonalcoholic fatty liver disease) 09/30/2017  . Obesity, Class I, BMI 30.0-34.9 (see actual BMI) 09/18/2017  . Polyarthralgia 02/15/2017  . OSA (obstructive sleep apnea) 02/15/2017  . Partial epilepsy with impairment of consciousness ([place]) 01/29/2016  . Low vitamin B12 level 05/27/2015  . Elevated PSA 05/27/2015  . Vitamin D deficiency 03/02/2015  . Other long term (current) drug therapy 03/12/2012  . Encounter for general adult medical examination with abnormal findings 04/26/2011  . TOTAL KNEE REPLACEMENT, LEFT, HX OF 04/07/2010  . Type 2 diabetes mellitus with diabetic nephropathy ([place]) 03/30/2010  . Hyperlipidemia associated with type 2 diabetes mellitus ([place]) 03/30/2010  . 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 Essential hypertension, benign 03/30/2010   Myles Gip PT, DPT [phone]  11/19/2020, 11:34 AM  [facility] [address] [place], [place], [address] Phone: [phone]   Fax:  [phone]  Name: Kirk Bennett. MRN: [id number] Date of Birth: Apr 24, 1951

## 2020-11-24 ENCOUNTER — Encounter: Payer: Self-pay | Admitting: Family Medicine

## 2020-11-24 ENCOUNTER — Ambulatory Visit: Payer: 59 | Admitting: Physical Therapy

## 2020-11-24 ENCOUNTER — Other Ambulatory Visit: Payer: Self-pay

## 2020-11-24 ENCOUNTER — Ambulatory Visit: Payer: 59 | Admitting: Family Medicine

## 2020-11-24 VITALS — BP 138/82 | HR 74 | Temp 97.9°F | Ht 74.0 in | Wt 237.2 lb

## 2020-11-24 DIAGNOSIS — N3941 Urge incontinence: Secondary | ICD-10-CM

## 2020-11-24 DIAGNOSIS — R2689 Other abnormalities of gait and mobility: Secondary | ICD-10-CM

## 2020-11-24 MED ORDER — OXYBUTYNIN CHLORIDE ER 10 MG PO TB24: 10.0000 mg | ORAL_TABLET | Freq: Every day | ORAL | 6 refills | Status: DC

## 2020-11-24 NOTE — Progress Notes (Signed)
Patient ID: Kirk Bennett., male    DOB: [birth year], 70 y.o.   MRN: [id number]  This visit was conducted in person.  BP 138/82   Pulse 74   Temp 97.9 F (36.6 C) (Temporal)   Ht 6\' 2"  (1.88 m)   Wt 237 lb 4 oz (107.6 kg)   SpO2 98%   BMI 30.46 kg/m    CC: bladder concerns  Subjective:   HPI: Kirk Bennett. is a 70 y.o. male presenting on 11/24/2020 for Urinary Incontinence (C/o continually urinating.  Has had issue for yrs. )   See prior notes for details.  Saw urology concern for overactive bladder with urgency, urge incontinence, s/p trial of oxybutynin, myrbetriq, flomax. Wets bed at night, trouble during day as well - cannot stop urination. Continues using depends. Denies incomplete emptying or dribbling.  Never fevers, abd pain, dysuria, hematuria, flank pain, nausea.  UCx last visit grew Klebsiella bacteria - ?UTI vs colonization. Treated with 7d augmentin course with benefit - felt decreased urge to go and decreased accidents while on antibiotic.   Marked gait instability over several months. 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  Instability has significantly improved with PT.   Denies concerns with memory even short term memory recall.   Recent MRI showed prominent ventricles raising question for NPH.      Relevant past medical, surgical, family and social history reviewed and updated as indicated. Interim medical history since our last visit reviewed. Allergies and medications reviewed and updated. Outpatient Medications Prior to Visit  Medication Sig Dispense Refill  . atorvastatin (LIPITOR) 20 MG tablet Take 1 tablet (20 mg total) by mouth daily. 90 tablet 3  . Cholecalciferol (VITAMIN D3) 1000 units CAPS Take 1,000 Units by mouth daily.    . clotrimazole (LOTRIMIN AF) 1 % cream Apply 1 application topically 2 (two) times daily. 113 g 0  . dapagliflozin propanediol (FARXIGA) 5 MG TABS tablet Take 1  tablet (5 mg total) by mouth daily before breakfast. 30 tablet 6  . glimepiride (AMARYL) 4 MG tablet TAKE 1 TABLET BY MOUTH DAILY WITH BREAKFAST 90 tablet 3  . glucose blood (ONE TOUCH ULTRA TEST) test strip Use to check sugar fasting in the AM and 2 hours after lunch or supper. Dx: E11.21 100 each 3  . levETIRAcetam (KEPPRA) 500 MG tablet Take 2 tablets (1,000 mg total) by mouth in the morning AND 3 tablets (1,500 mg total) every evening.    Marland Kitchen lisinopril (ZESTRIL) 10 MG tablet TAKE 1 TABLET BY MOUTH TWICE A DAY 180 tablet 3  . metFORMIN (GLUCOPHAGE) 1000 MG tablet TAKE 1 TABLET BY MOUTH TWICE A DAY WITH MEALS 180 tablet 3  . amoxicillin-clavulanate (AUGMENTIN) 500-125 MG tablet Take 1 tablet (500 mg total) by mouth 3 (three) times daily. 14 tablet 0   No facility-administered medications prior to visit.     Per HPI unless specifically indicated in ROS section below Review of Systems Objective:  BP 138/82   Pulse 74   Temp 97.9 F (36.6 C) (Temporal)   Ht 6\' 2"  (1.88 m)   Wt 237 lb 4 oz (107.6 kg)   SpO2 98%   BMI 30.46 kg/m   Wt Readings from Last 3 Encounters:  11/24/20 237 lb 4 oz (107.6 kg)  10/02/20 245 lb 4 oz (111.2 kg)  07/08/20 248 lb 4 oz (112.6 kg)      Physical Exam Vitals and nursing note reviewed.  Constitutional:      Appearance: Normal appearance. He is not ill-appearing.  Cardiovascular:     Rate and Rhythm: Normal rate and regular rhythm.     Pulses: Normal pulses.     Heart sounds: Normal heart sounds. No murmur heard.   Pulmonary:     Effort: Pulmonary effort is normal. No respiratory distress.     Breath sounds: Normal breath sounds. No wheezing, rhonchi or rales.  Abdominal:     General: Bowel sounds are normal.     Palpations: Abdomen is soft. There is no mass.     Tenderness: There is no abdominal tenderness. There is no right CVA tenderness, left CVA tenderness, guarding or rebound.     Hernia: No hernia is present.  Musculoskeletal:     Right  lower leg: No edema.     Left lower leg: No edema.  Skin:    General: Skin is warm and dry.     Findings: No rash.  Neurological:     Mental Status: He is alert.     Coordination: Romberg sign negative.     Comments:  5/5 strength BLE Ongoing unsteady gait albeit improved from last visit, unsteady with romberg testing  No pronator drift  Difficulty with toe walking       Results for orders placed or performed in visit on 10/02/20  Urine Culture   Specimen: Urine  Result Value Ref Range   MICRO NUMBER: [id number]    SPECIMEN QUALITY: Adequate    Sample Source NOT GIVEN    STATUS: FINAL    ISOLATE 1: Klebsiella pneumoniae (A)       Susceptibility   Klebsiella pneumoniae - URINE CULTURE, REFLEX    AMOX/CLAVULANIC <=2 Sensitive     AMPICILLIN >=32 Resistant     AMPICILLIN/SULBACTAM 4 Sensitive     CEFAZOLIN* <=4 Not Reportable      * For infections other than uncomplicated UTIcaused by E. coli, K. pneumoniae or P. mirabilis:Cefazolin is resistant if MIC > or = 8 mcg/mL.(Distinguishing susceptible versus intermediatefor isolates with MIC < or = 4 mcg/mL requiresadditional testing.)For uncomplicated UTI caused by E. coli,K. pneumoniae or P. mirabilis: Cefazolin issusceptible if MIC <32 mcg/mL and predictssusceptible to the oral agents cefaclor, cefdinir,cefpodoxime, cefprozil, cefuroxime, cephalexinand loracarbef.    CEFEPIME <=1 Sensitive     CEFTRIAXONE <=1 Sensitive     CIPROFLOXACIN <=0.25 Sensitive     LEVOFLOXACIN <=0.12 Sensitive     ERTAPENEM <=0.5 Sensitive     GENTAMICIN <=1 Sensitive     IMIPENEM <=0.25 Sensitive     NITROFURANTOIN 64 Intermediate     PIP/TAZO <=4 Sensitive     TOBRAMYCIN <=1 Sensitive     TRIMETH/SULFA* <=20 Sensitive      * For infections other than uncomplicated UTIcaused by E. coli, K. pneumoniae or P. mirabilis:Cefazolin is resistant if MIC > or = 8 mcg/mL.(Distinguishing susceptible versus intermediatefor isolates with MIC < or = 4 mcg/mL  requiresadditional testing.)For uncomplicated UTI caused by E. coli,K. pneumoniae or P. mirabilis: Cefazolin issusceptible if MIC <32 mcg/mL and predictssusceptible to the oral agents cefaclor, cefdinir,cefpodoxime, cefprozil, cefuroxime, cephalexinand loracarbef.Legend:S = Susceptible  I = IntermediateR = Resistant  NS = Not susceptible* = Not tested  NR = Not reported**NN = See antimicrobic comments  POCT Urinalysis Dipstick (Automated)  Result Value Ref Range   Color, UA yellow    Clarity, UA cloudy    Glucose, UA Negative Negative   Bilirubin, UA negative    Ketones, UA negative  Spec Grav, UA 1.025 1.010 - 1.025   Blood, UA negative    pH, UA 5.5 5.0 - 8.0   Protein, UA Negative Negative   Urobilinogen, UA 0.2 0.2 or 1.0 E.U./dL   Nitrite, UA postitive    Leukocytes, UA Negative Negative   Assessment & Plan:  This visit occurred during the SARS-CoV-2 public health emergency.  Safety protocols were in place, including screening questions prior to the visit, additional usage of staff PPE, and extensive cleaning of exam room while observing appropriate contact time as indicated for disinfecting solutions.   Problem List Items Addressed This Visit    Urge urinary incontinence - Primary    Ongoing difficulty with urinary urge incontinence during both day and night. Has seen uro. Recent MRI ?NPH which could explain symptoms - I will see if I can reach [facility] neurology to touch base on recent MRI results.  In interim will have him return urine sample for rpt UA UCx and restart oxybutynin XL 10mg  daily which was previously helpful.  I-PSS = 19-6      Relevant Medications   oxybutynin (DITROPAN-XL) 10 MG 24 hr tablet   Other Relevant Orders   Urine Culture   Urinalysis, Routine w reflex microscopic   Impaired gait and mobility    Actual improvement noted with outpatient PT course which he has now completed.  Still I will touch base with neurology about recent MRI imaging.            Meds ordered this encounter  Medications  . oxybutynin (DITROPAN-XL) 10 MG 24 hr tablet    Sig: Take 1 tablet (10 mg total) by mouth at bedtime.    Dispense:  30 tablet    Refill:  6   Orders Placed This Encounter  Procedures  . Urine Culture    Standing Status:   Future    Standing Expiration Date:   11/24/2021  . Urinalysis, Routine w reflex microscopic    Standing Status:   Future    Standing Expiration Date:   11/24/2021    Patient Instructions  Bring [place] back a urine sample to send for culture.  I will touch base with [facility] neurology about recent MRI.  Restart oxybutynin nightly which should hopefully help urgency/incontinence symptoms.   Follow up plan: Return if symptoms worsen or fail to improve.  Ria Bush, MD

## 2020-11-24 NOTE — Assessment & Plan Note (Signed)
Actual improvement noted with outpatient PT course which he has now completed.  Still I will touch base with neurology about recent MRI imaging.

## 2020-11-24 NOTE — Patient Instructions (Addendum)
Bring [place] back a urine sample to send for culture.  I will touch base with [facility] neurology about recent MRI.  Restart oxybutynin nightly which should hopefully help urgency/incontinence symptoms.

## 2020-11-24 NOTE — Assessment & Plan Note (Addendum)
Ongoing difficulty with urinary urge incontinence during both day and night. Has seen uro. Recent MRI ?NPH which could explain symptoms - I will see if I can reach [place] neurology to touch base on recent MRI results.  In interim will have him return urine sample for rpt UA UCx and restart oxybutynin XL 10mg  daily which was previously helpful.  I-PSS = 19-6

## 2020-11-26 ENCOUNTER — Encounter: Payer: 59 | Admitting: Physical Therapy

## 2020-12-01 ENCOUNTER — Encounter: Payer: 59 | Admitting: Physical Therapy

## 2020-12-01 ENCOUNTER — Other Ambulatory Visit (INDEPENDENT_AMBULATORY_CARE_PROVIDER_SITE_OTHER): Payer: 59

## 2020-12-01 DIAGNOSIS — N3941 Urge incontinence: Secondary | ICD-10-CM

## 2020-12-01 NOTE — Addendum Note (Signed)
Addended by: Cloyd Stagers on: 12/01/2020 12:54 PM   Modules accepted: Orders

## 2020-12-03 ENCOUNTER — Encounter: Payer: 59 | Admitting: Physical Therapy

## 2020-12-03 LAB — URINE CULTURE
MICRO NUMBER:: 11760296
SPECIMEN QUALITY:: ADEQUATE

## 2020-12-03 LAB — URINALYSIS, ROUTINE W REFLEX MICROSCOPIC
Bilirubin Urine: NEGATIVE
Hgb urine dipstick: NEGATIVE
Ketones, ur: NEGATIVE
Leukocytes,Ua: NEGATIVE
Nitrite: NEGATIVE
Protein, ur: NEGATIVE
Specific Gravity, Urine: 1.018 (ref 1.001–1.03)
pH: 5.5 (ref 5.0–8.0)

## 2020-12-07 ENCOUNTER — Telehealth: Payer: Self-pay

## 2020-12-07 NOTE — Telephone Encounter (Signed)
Lvm with Leafy Ro, nurse for Dr. Delsa Sale of [place], asking to have him return Dr. Synthia Innocent call directly on our office backline.

## 2020-12-08 ENCOUNTER — Encounter: Payer: 59 | Admitting: Physical Therapy

## 2020-12-09 NOTE — Telephone Encounter (Signed)
Called and spoke to [place] and advised him that Dr. Danise Mina has been trying to get in touch with Dr. Delsa Sale to discuss the patient since Friday. Merry Proud stated that the patient last saw Claiborne Billings their PA. Merry Proud was given the back line number and he stated that he will have Claiborne Billings call back to talk with Dr. Danise Mina. Merry Proud stated in the future to call [phone] and hit option 4.

## 2020-12-09 NOTE — Telephone Encounter (Signed)
Please give Kirk Bennett my cell # to call and discuss patient tomorrow as I'm out of office.

## 2020-12-09 NOTE — Telephone Encounter (Signed)
Merry Proud nurse at [facility] neurology left v/m that Dr Delsa Sale does not know pt and Theadore Nan PA does know pt but Claiborne Billings is out of office and Merry Proud said that Claiborne Billings will call Dr Darnell Level on 12/10/20 or 12/11/20. Any questions or concerns can call [phone] option 4. Sending note to DR G and R Laws LPN.

## 2020-12-10 ENCOUNTER — Encounter: Payer: 59 | Admitting: Physical Therapy

## 2020-12-10 NOTE — Telephone Encounter (Signed)
Spoke with pt relaying Dr. Synthia Innocent message.  Pt verbalizes understanding and states his gait is good right now.  Also, oxybutynin has helped with the urinary incontinence.  FYI to Dr. Darnell Level.

## 2020-12-10 NOTE — Telephone Encounter (Signed)
Spoke with [facility] PA [phone]).   Please call patient to notify I spoke with [facility]. I discussed my concerns with Claiborne Billings. Still unclear picture as to cause of his unsteadiness but concern for neurologic process going on - important for him to keep [facility] movement disorder clinic evaluation scheduled for 12/31/2020 for further evaluation and recommendations on further testing.   How is gait? How is urine incontinence on nightly oxybutynin?   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 Let me know how the oxybutynin is helping. If ongoing, we may have you return to see the urologist Dr Diamantina Providence again.

## 2020-12-10 NOTE — Telephone Encounter (Signed)
Spoke with Merry Proud providing him with Dr. Synthia Innocent cell # to give Claiborne Billings.  Says he is documenting the info and will fwd to [employer], [place].

## 2020-12-15 ENCOUNTER — Encounter: Payer: 59 | Admitting: Physical Therapy

## 2021-01-01 ENCOUNTER — Ambulatory Visit: Payer: 59 | Admitting: Family Medicine

## 2021-01-08 ENCOUNTER — Telehealth: Payer: Self-pay

## 2021-01-08 ENCOUNTER — Encounter: Payer: Self-pay | Admitting: Family Medicine

## 2021-01-08 ENCOUNTER — Other Ambulatory Visit: Payer: Self-pay

## 2021-01-08 ENCOUNTER — Ambulatory Visit: Payer: 59 | Admitting: Family Medicine

## 2021-01-08 VITALS — BP 126/64 | HR 84 | Temp 97.8°F | Ht 74.0 in | Wt 236.1 lb

## 2021-01-08 DIAGNOSIS — N3941 Urge incontinence: Secondary | ICD-10-CM

## 2021-01-08 DIAGNOSIS — R2689 Other abnormalities of gait and mobility: Secondary | ICD-10-CM | POA: Diagnosis not present

## 2021-01-08 DIAGNOSIS — E1121 Type 2 diabetes mellitus with diabetic nephropathy: Secondary | ICD-10-CM | POA: Diagnosis not present

## 2021-01-08 DIAGNOSIS — I10 Essential (primary) hypertension: Secondary | ICD-10-CM | POA: Diagnosis not present

## 2021-01-08 DIAGNOSIS — R29898 Other symptoms and signs involving the musculoskeletal system: Secondary | ICD-10-CM

## 2021-01-08 DIAGNOSIS — Z1211 Encounter for screening for malignant neoplasm of colon: Secondary | ICD-10-CM

## 2021-01-08 DIAGNOSIS — E1140 Type 2 diabetes mellitus with diabetic neuropathy, unspecified: Secondary | ICD-10-CM

## 2021-01-08 DIAGNOSIS — G40109 Localization-related (focal) (partial) symptomatic epilepsy and epileptic syndromes with simple partial seizures, not intractable, without status epilepticus: Secondary | ICD-10-CM

## 2021-01-08 LAB — POCT GLYCOSYLATED HEMOGLOBIN (HGB A1C): Hemoglobin A1C: 7.6 % — AB (ref 4.0–5.6)

## 2021-01-08 NOTE — Progress Notes (Signed)
Patient ID: Kirk Bennett., male    DOB: [birth year], 70 y.o.   MRN: [id number]  This visit was conducted in person.  BP 126/64   Pulse 84   Temp 97.8 F (36.6 C) (Temporal)   Ht 6\' 2"  (1.88 m)   Wt 236 lb 1 oz (107.1 kg)   SpO2 97%   BMI 30.31 kg/m   BP Readings from Last 3 Encounters:  01/08/21 126/64  11/24/20 138/82  10/02/20 140/70    CC: DM f/u visit  Subjective:   HPI: Kirk Bennett. is a 70 y.o. male presenting on 01/08/2021 for Diabetes (Here for 3 mo f/u.)   Overdue for colonoscopy - requests referral.   Sees neurology Dr Opal Sidles for seizures on keppra - Dr Opal Sidles will be leaving practice in June, planning to establish with another epileptologist.   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   Ongoing urge incontinence - restarted oxybutynin XL 10mg  daily which was previously helpful - tolerating well without blurry vision or dry mouth. Wears depens and pad. Both accidents with leaking and full incontinence without warning.   DM - does regularly check sugars - well controlled. Compliant with antihyperglycemic regimen which includes: amaryl 4mg  daily, farxiga 5mg  daily, metformin 1000mg  bid. Denies low sugars or hypoglycemic symptoms. Denies paresthesias. Last diabetic eye exam DUE. Pneumovax: 01/2020. Prevnar: 04/2018. Glucometer brand: one touch. DSME: completed remotely. Lab Results  Component Value Date   HGBA1C 7.6 (A) 01/08/2021   Diabetic Foot Exam - Simple   Simple Foot Form Diabetic Foot exam was performed with the following findings: Yes 01/08/2021  9:01 AM  Visual Inspection No deformities, no ulcerations, no other skin breakdown  bilaterally: Yes Sensation Testing Intact to touch and monofilament testing bilaterally: Yes Pulse Check Posterior Tibialis and Dorsalis pulse intact bilaterally: Yes Comments    Lab Results  Component Value Date   MICROALBUR <0.2 09/11/2015       Relevant past medical, surgical, family and social history reviewed and updated as indicated. Interim medical history since our last visit reviewed. Allergies and medications reviewed and updated. Outpatient Medications Prior to Visit  Medication Sig Dispense Refill  . atorvastatin (LIPITOR) 20 MG tablet Take 1 tablet (20 mg total) by mouth daily. 90 tablet 3  . Cholecalciferol (VITAMIN D3) 1000 units CAPS Take 1,000 Units by mouth daily.    . clotrimazole (LOTRIMIN AF) 1 % cream Apply 1 application topically 2 (two) times daily. 113 g 0  . dapagliflozin propanediol (FARXIGA) 5 MG TABS tablet Take 1 tablet (5 mg total) by mouth daily before breakfast. 30 tablet 6  . glimepiride (AMARYL) 4 MG tablet TAKE 1 TABLET BY MOUTH DAILY WITH BREAKFAST 90 tablet 3  . glucose blood (ONE TOUCH ULTRA TEST) test strip Use to check sugar fasting in the AM and 2 hours after lunch or supper. Dx: E11.21 100 each 3  . levETIRAcetam (KEPPRA) 500 MG tablet Take 2 tablets (1,000 mg total) by mouth in the morning AND 3 tablets (1,500 mg total) every evening.    Marland Kitchen lisinopril (ZESTRIL) 10 MG tablet TAKE 1 TABLET BY MOUTH TWICE A DAY 180 tablet 3  . metFORMIN (GLUCOPHAGE) 1000 MG tablet TAKE 1 TABLET  BY MOUTH TWICE A DAY WITH MEALS 180 tablet 3  . oxybutynin (DITROPAN-XL) 10 MG 24 hr tablet Take 1 tablet (10 mg total) by mouth at bedtime. 30 tablet 6   No facility-administered medications prior to visit.     Per HPI unless specifically indicated in ROS section below Review of Systems Objective:  BP 126/64   Pulse 84   Temp 97.8 F (36.6 C) (Temporal)   Ht 6\' 2"  (1.88 m)   Wt 236 lb 1 oz (107.1 kg)   SpO2 97%   BMI 30.31 kg/m   Wt Readings from Last 3  Encounters:  01/08/21 236 lb 1 oz (107.1 kg)  11/24/20 237 lb 4 oz (107.6 kg)  10/02/20 245 lb 4 oz (111.2 kg)      Physical Exam Vitals and nursing note reviewed.  Constitutional:      General: He is not in acute distress.    Appearance: Normal appearance. He is well-developed. He is not ill-appearing.  Eyes:     General: No scleral icterus.    Extraocular Movements: Extraocular movements intact.     Conjunctiva/sclera: Conjunctivae normal.     Pupils: Pupils are equal, round, and reactive to light.  Cardiovascular:     Rate and Rhythm: Normal rate and regular rhythm.     Pulses: Normal pulses.     Heart sounds: Normal heart sounds. No murmur heard.   Pulmonary:     Effort: Pulmonary effort is normal. No respiratory distress.     Breath sounds: Normal breath sounds. No wheezing, rhonchi or rales.  Musculoskeletal:     Cervical back: Normal range of motion and neck supple.     Right lower leg: No edema.     Left lower leg: No edema.     Comments: See HPI for foot exam if done  Lymphadenopathy:     Cervical: No cervical adenopathy.  Skin:    General: Skin is warm and dry.     Findings: No rash.  Neurological:     Mental Status: He is alert.     Comments:  Difficulty getting out of chair even with arm use 5/5 strength BLE Slowed gait, walks with stooped forward posture       Results for orders placed or performed in visit on 01/08/21  POCT glycosylated hemoglobin (Hb A1C)  Result Value Ref Range   Hemoglobin A1C 7.6 (A) 4.0 - 5.6 %   HbA1c POC (<> result, manual entry)     HbA1c, POC (prediabetic range)     HbA1c, POC (controlled diabetic range)     Assessment & Plan:  This visit occurred during the SARS-CoV-2 public health emergency.  Safety protocols were in place, including screening questions prior to the visit, additional usage of staff PPE, and extensive cleaning of exam room while observing appropriate contact time as indicated for disinfecting solutions.    Problem List Items Addressed This Visit    Type 2 diabetes mellitus with diabetic nephropathy ([place]) - Primary    Chronic, improving control since restarting farxiga.  Continue current regimen without med changes. Reassess at 81mo DM f/u visit.       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       Essential hypertension, benign    Chronic, well controlled      Weakness  of both lower extremities    Ongoing, however strength preserved on exam. See below.       Urge urinary incontinence    Ongoing trouble despite restarting oxybutynin - # provided to call and schedule urology f/u.  UCx grew Klebsiella x2 earlier this year, initially treated with abx course but presumed colonization on repeat as no other UTI symptoms.       Impaired gait and mobility    Appreciate [facility] movement disorder clinic evaluation. They plan to continue monitoring abnormal gait.  Continue HEP received through PT.       Type 2 diabetes mellitus with diabetic neuropathy, unspecified ([place])    Other Visit Diagnoses    Special screening for malignant neoplasms, colon       Relevant Orders   Ambulatory referral to Gastroenterology       No orders of the defined types were placed in this encounter.  Orders Placed This Encounter  Procedures  . Ambulatory referral to Gastroenterology    Referral Priority:   Routine    Referral Type:   Consultation    Referral Reason:   Specialty Services Required    Number of Visits Requested:   1  . POCT glycosylated hemoglobin (Hb A1C)    Patient Instructions  Call Dr Diamantina Providence for follow up visit for ongoing urinary symptoms. [phone] Touch base with eye doctor as I think you're due for visit.  Good to see you today Return in 3 months for diabetes follow up visit.     Follow up plan: Return in about 3 months (around 04/10/2021) for follow up visit.  Ria Bush, MD

## 2021-01-08 NOTE — Assessment & Plan Note (Signed)
Chronic, well-controlled. 

## 2021-01-08 NOTE — Patient Instructions (Addendum)
Call Dr Diamantina Providence for follow up visit for ongoing urinary symptoms. [phone] Touch base with eye doctor as I think you're due for visit.  Good to see you today Return in 3 months for diabetes follow up visit.

## 2021-01-08 NOTE — Telephone Encounter (Signed)
[place] Night - Client Nonclinical Telephone Record AccessNurse Client [facility] Night - Client Client Site [place] - Night Physician [facility] - PHYSICIAN, NOT LISTED- MD Contact Type Call Who Is Calling Patient / Member / Family / Caregiver Caller Name [place] Phone Number [phone] Patient Name Kirk Bennett Patient DOB November 23, 1950 Call Type Message Only Information Provided Reason for Call Request for General Office Information Initial Comment Caller states he would like to know what time his appointment is scheduled. Disp. Time Disposition Final User 01/08/2021 7:01:28 AM General Information Provided Yes Kirk Bennett, [name] Call Closed By: Lezlie Octave Transaction Date/Time: 01/08/2021 6:59:37 AM (ET)

## 2021-01-08 NOTE — Telephone Encounter (Signed)
Pt notified of appt time today and pt said he is on his way.

## 2021-01-08 NOTE — Assessment & Plan Note (Addendum)
Appreciate [facility] movement disorder clinic evaluation. They plan to continue monitoring abnormal gait.  Continue HEP received through PT.

## 2021-01-08 NOTE — Assessment & Plan Note (Signed)
Ongoing, however strength preserved on exam. See below.

## 2021-01-08 NOTE — Assessment & Plan Note (Signed)
Dr Opal Sidles leaving this summer - planning to establish with new provider at next visit.

## 2021-01-08 NOTE — Assessment & Plan Note (Signed)
Ongoing trouble despite restarting oxybutynin - # provided to call and schedule urology f/u.  UCx grew Klebsiella x2 earlier this year, initially treated with abx course but presumed colonization on repeat as no other UTI symptoms.

## 2021-01-08 NOTE — Assessment & Plan Note (Signed)
Chronic, improving control since restarting farxiga.  Continue current regimen without med changes. Reassess at 48mo DM f/u visit.

## 2021-02-03 NOTE — Telephone Encounter (Signed)
Faxed application on 08/25/99.

## 2021-02-03 NOTE — Telephone Encounter (Signed)
Received faxed response from [employer] stating they will not be able to offer enrollment into the rx savings program because his insurance provides coverage for the product.  FYI to Dr. Darnell Level.

## 2021-02-05 NOTE — Telephone Encounter (Signed)
Spoke with pt informing him of decision form rx assistance program and relaying Dr. Synthia Innocent message.  Pt verbalizes understanding.  States Wilder Glade is about $40.00, which he says affordable right now.  Pt will contact the pharmacy to have if filled.  He will let Dr. Darnell Level know if it becomes unaffordable.

## 2021-02-05 NOTE — Telephone Encounter (Addendum)
Noted. Plz notify pt - will need to go through insurance for [place].  Is the medication affordable? If not ,may need to price out alternatives jardiance or invokana

## 2021-02-28 IMAGING — CT CT HEAD W/O CM
4 series · 17 of 47 positions shown, 19 images · non-contrast
Comparison: None.

CLINICAL DATA: Seizure

EXAM:
CT HEAD WITHOUT CONTRAST
TECHNIQUE: Contiguous axial images were obtained from the base of the skull
through the vertex without intravenous contrast.

[Series 2: head bone · axial · 0.42mm/px · z∈[+315,+373]mm · 4 of 85 slices shown]
[im 9/85  bone]
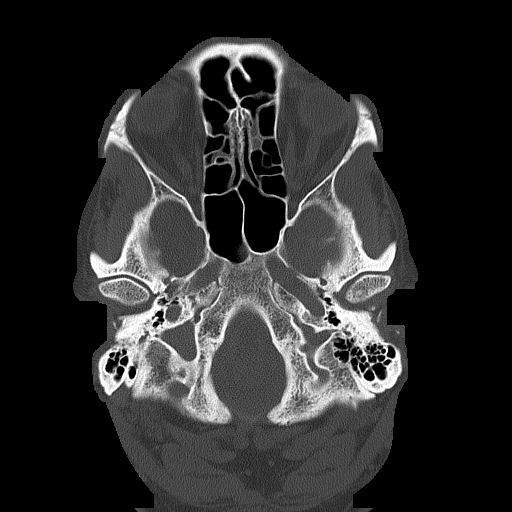
[im 17/85  bone]
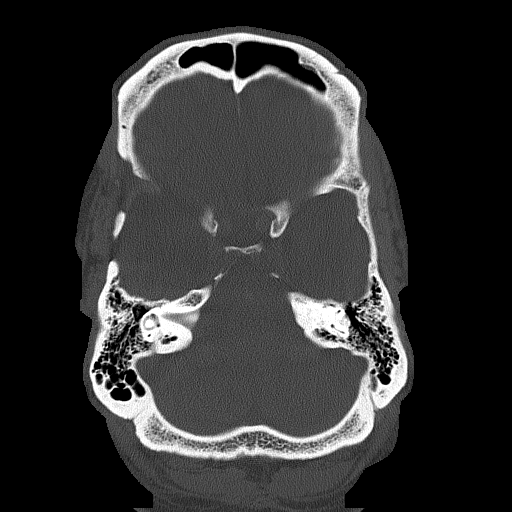
[im 26/85  bone]
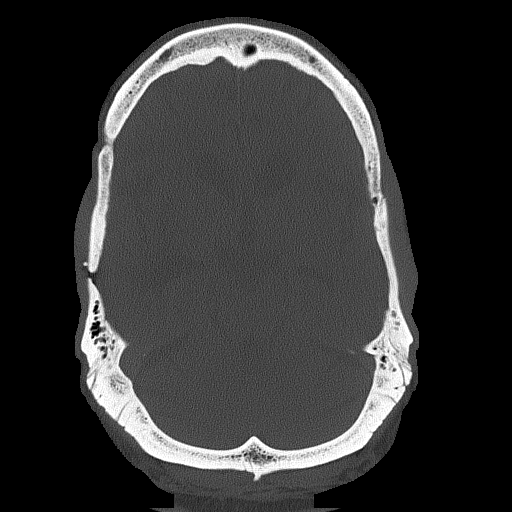
[im 38/85  bone]
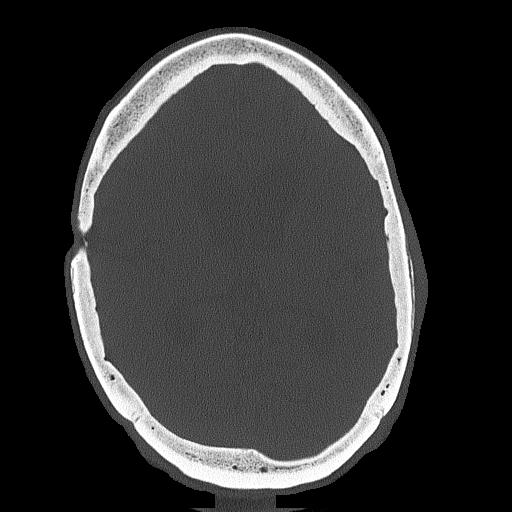

[Series 3: head wo · axial · 0.42mm/px · z∈[+319,+439]mm · 7 of 34 slices shown, 9 images]
[im 5/34  brain]
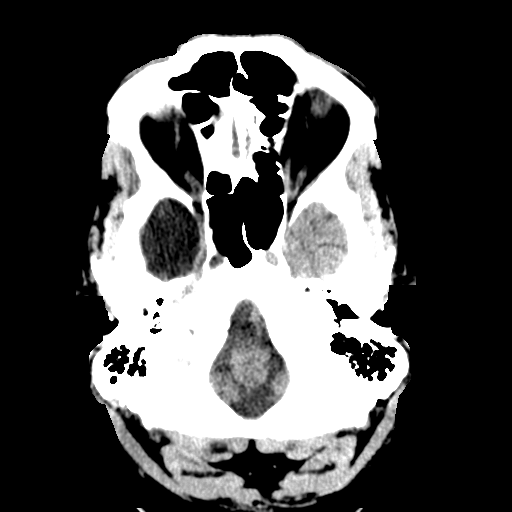
[im 5/34  bone]
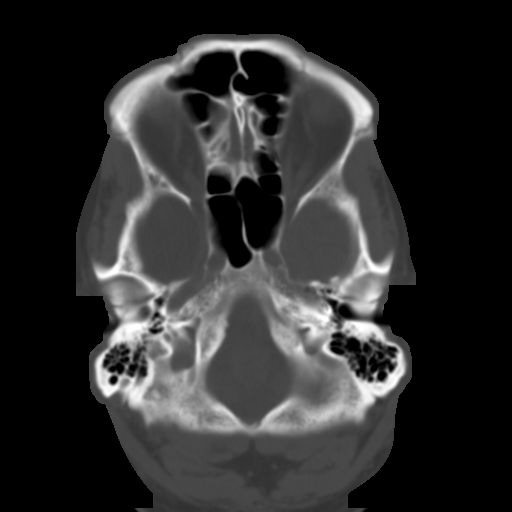
[im 9/34  brain]
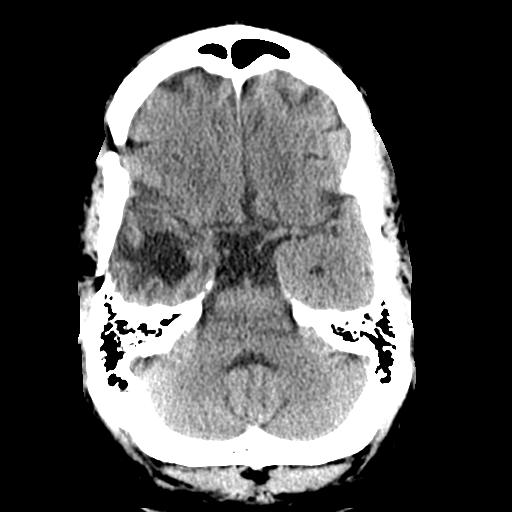
[im 13/34  brain]
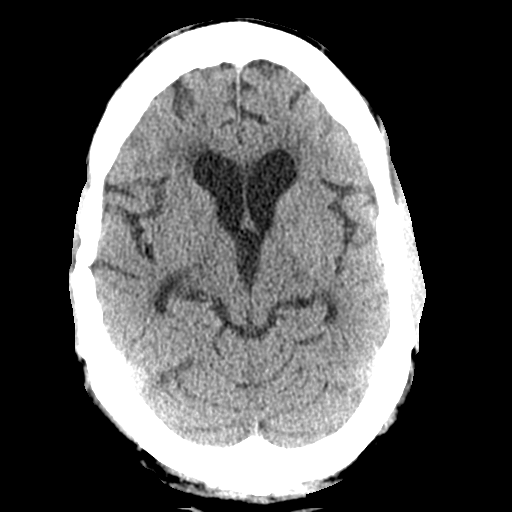
[im 17/34  brain]
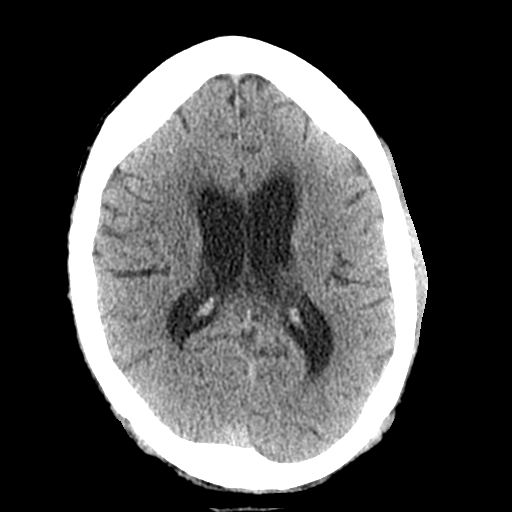
[im 21/34  brain]
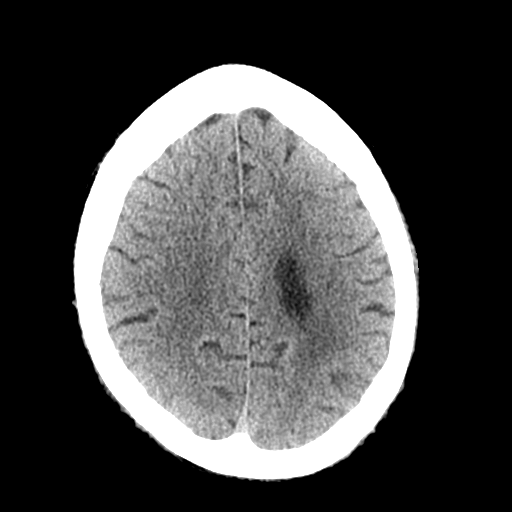
[im 21/34  bone]
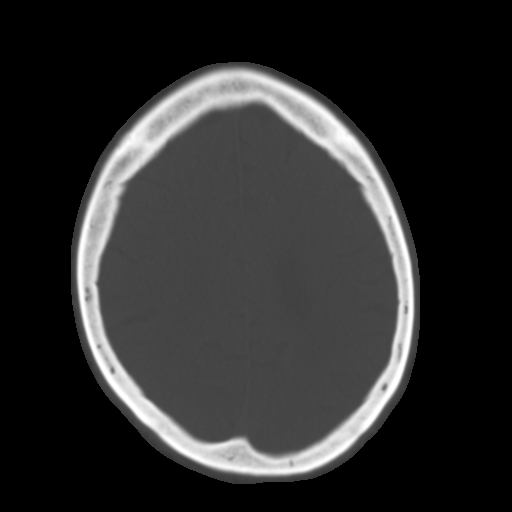
[im 25/34  brain]
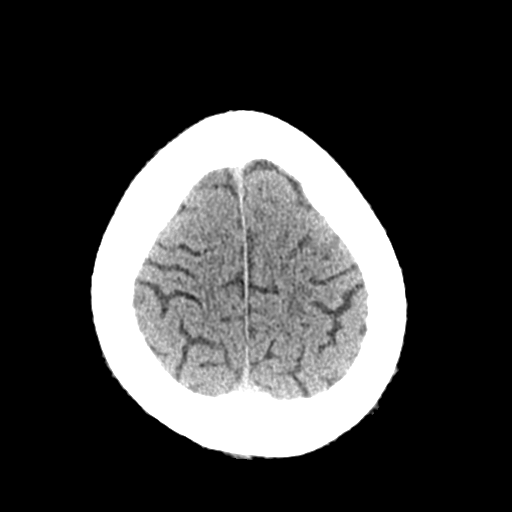
[im 29/34  brain]
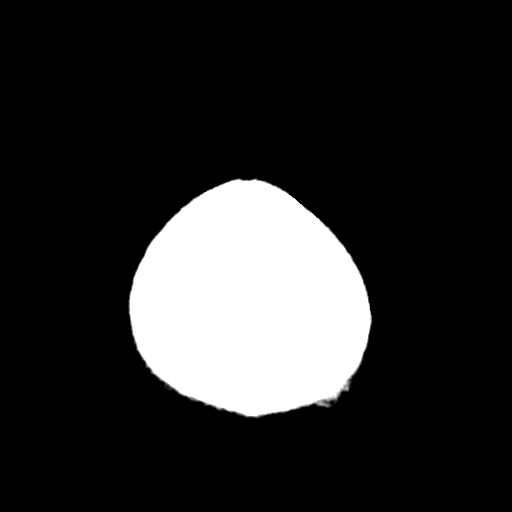

[Series 4: coronal soft tissue · coronal · 0.35mm/px · 3 of 70 slices shown]
[im 24/70  brain]
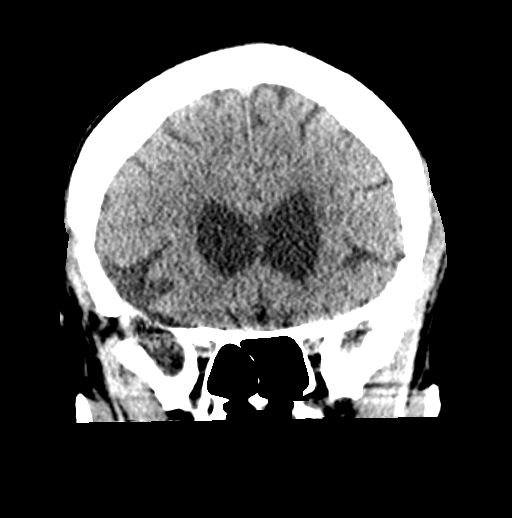
[im 31/70  brain]
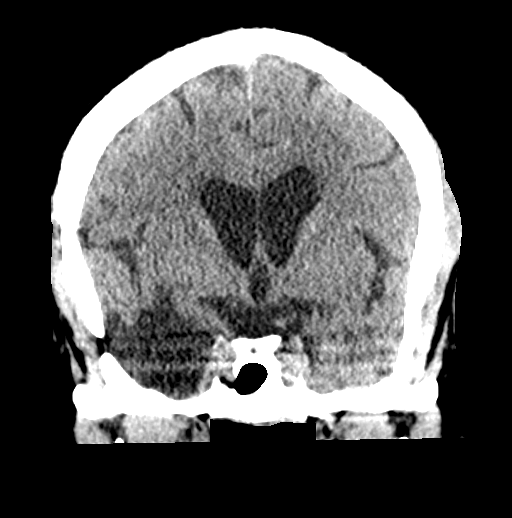
[im 39/70  brain]
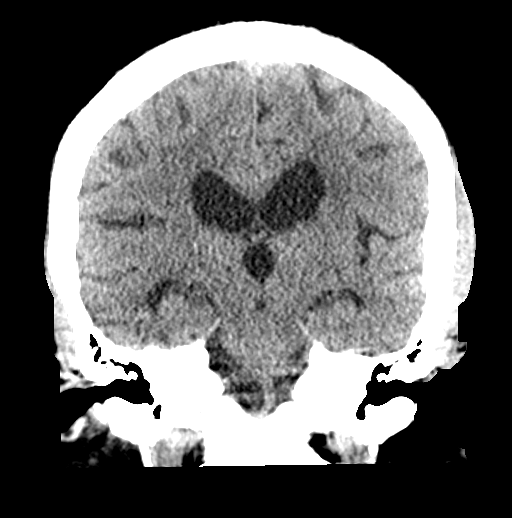

[Series 5: sagittal soft tissue · sagittal · 0.36mm/px · 3 of 58 slices shown]
[im 20/58  brain]
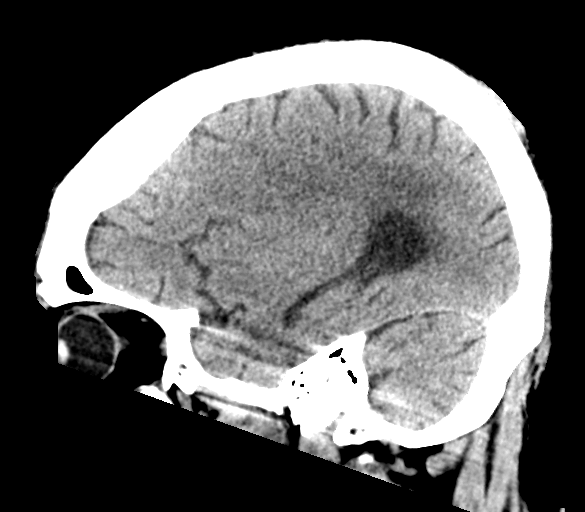
[im 29/58  brain]
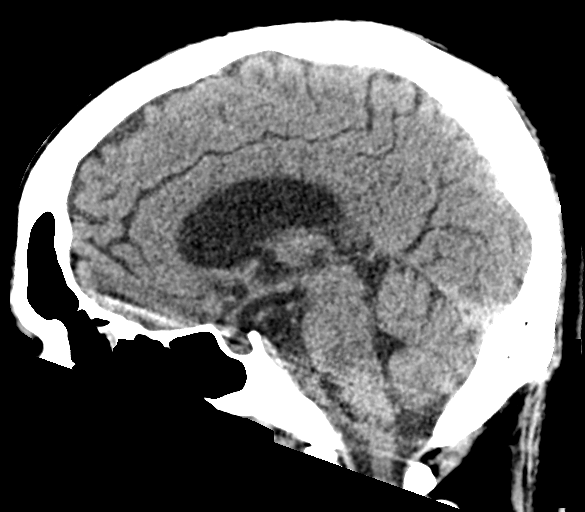
[im 39/58  brain]
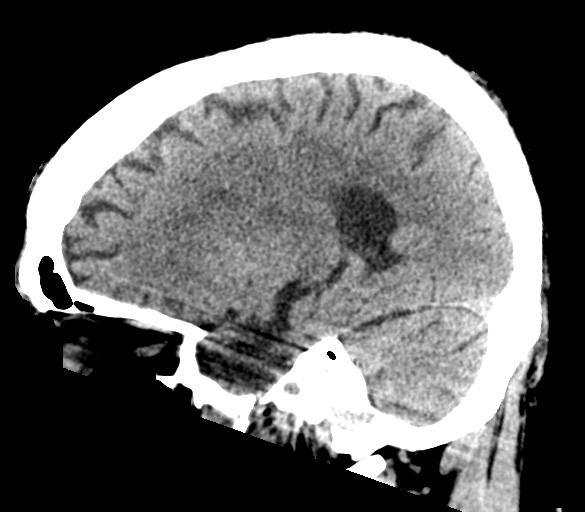

[17 of 47 positions shown; findings below may reference images not displayed]

FINDINGS: Brain: There is no acute intracranial hemorrhage, mass-effect, or
edema. There is no acute appearing loss of gray differentiation.
Right temporal encephalomalacia is present with ex vacuo dilatation
of the right temporal horn. Additional patchy hypoattenuation in the
supratentorial white matter is nonspecific but may reflect mild
chronic microvascular ischemic changes. There is no extra-axial
fluid collection.

Vascular: No hyperdense vessel or unexpected calcification.

Skull: Prior right craniotomy.  Otherwise unremarkable.

Sinuses/Orbits: Mild mucosal thickening.  Orbits are unremarkable.

Other: None.
IMPRESSION: No acute intracranial hemorrhage, mass effect, or evidence of acute
infarction.

Right temporal encephalomalacia.

Mild chronic microvascular ischemic changes.

## 2021-03-10 ENCOUNTER — Ambulatory Visit: Payer: 59 | Admitting: Urology

## 2021-03-12 ENCOUNTER — Encounter: Payer: Self-pay | Admitting: Urology

## 2021-03-31 ENCOUNTER — Ambulatory Visit: Payer: 59 | Admitting: Urology

## 2021-04-07 ENCOUNTER — Ambulatory Visit: Payer: 59 | Admitting: Urology

## 2021-04-07 ENCOUNTER — Other Ambulatory Visit: Payer: Self-pay

## 2021-04-07 ENCOUNTER — Encounter: Payer: Self-pay | Admitting: Urology

## 2021-04-07 VITALS — BP 160/78 | HR 86 | Ht 74.0 in | Wt 230.0 lb

## 2021-04-07 DIAGNOSIS — N3941 Urge incontinence: Secondary | ICD-10-CM

## 2021-04-07 DIAGNOSIS — N390 Urinary tract infection, site not specified: Secondary | ICD-10-CM | POA: Diagnosis not present

## 2021-04-07 LAB — BLADDER SCAN AMB NON-IMAGING

## 2021-04-07 NOTE — Patient Instructions (Signed)
Cystoscopy Cystoscopy is a procedure that is used to help diagnose and sometimes treat conditions that affect the lower urinary tract. The lower urinary tract includes the bladder and the urethra. The urethra is the tube that drains urine from the bladder. Cystoscopy is done using a thin, tube-shaped instrument with a light and camera at the end (cystoscope). The cystoscope may be hard or flexible, depending on the goal of the procedure. The cystoscope is inserted through the urethra, into the bladder. Cystoscopy may be recommended if you have: Urinary tract infections that keep coming back. Blood in the urine (hematuria). An inability to control when you urinate (urinary incontinence) or an overactive bladder. Unusual cells found in a urine sample. A blockage in the urethra, such as a urinary stone. Painful urination. An abnormality in the bladder found during an intravenous pyelogram (IVP) or CT scan. Cystoscopy may also be done to remove a sample of tissue to be examined under a microscope (biopsy). What are the risks? Generally, this is a safe procedure. However, problems may occur, including: Infection. Bleeding.  What happens during the procedure?  You will be given one or more of the following: A medicine to numb the area (local anesthetic). The area around the opening of your urethra will be cleaned. The cystoscope will be passed through your urethra into your bladder. Germ-free (sterile) fluid will flow through the cystoscope to fill your bladder. The fluid will stretch your bladder so that your health care provider can clearly examine your bladder walls. Your doctor will look at the urethra and bladder. The cystoscope will be removed The procedure may vary among health care providers  What can I expect after the procedure? After the procedure, it is common to have: Some soreness or pain in your abdomen and urethra. Urinary symptoms. These include: Mild pain or burning when you  urinate. Pain should stop within a few minutes after you urinate. This may last for up to 1 week. A small amount of blood in your urine for several days. Feeling like you need to urinate but producing only a small amount of urine. Follow these instructions at home: General instructions Return to your normal activities as told by your health care provider.  Do not drive for 24 hours if you were given a sedative during your procedure. Watch for any blood in your urine. If the amount of blood in your urine increases, call your health care provider. If a tissue sample was removed for testing (biopsy) during your procedure, it is up to you to get your test results. Ask your health care provider, or the department that is doing the test, when your results will be ready. Drink enough fluid to keep your urine pale yellow. Keep all follow-up visits as told by your health care provider. This is important. Contact a health care provider if you: Have pain that gets worse or does not get better with medicine, especially pain when you urinate. Have trouble urinating. Have more blood in your urine. Get help right away if you: Have blood clots in your urine. Have abdominal pain. Have a fever or chills. Are unable to urinate. Summary Cystoscopy is a procedure that is used to help diagnose and sometimes treat conditions that affect the lower urinary tract. Cystoscopy is done using a thin, tube-shaped instrument with a light and camera at the end. After the procedure, it is common to have some soreness or pain in your abdomen and urethra. Watch for any blood in your urine.   If the amount of blood in your urine increases, call your health care provider. If you were prescribed an antibiotic medicine, take it as told by your health care provider. Do not stop taking the antibiotic even if you start to feel better. This information is not intended to replace advice given to you by your health care provider. Make  sure you discuss any questions you have with your health care provider. Document Revised: 07/31/2018 Document Reviewed: 07/31/2018 Elsevier Patient Education  2020 Elsevier Inc.  

## 2021-04-07 NOTE — Progress Notes (Signed)
   04/07/2021 1:55 PM   Kirk Bennett. 23-Sep-1950 [id number]  Reason for visit: Urinary urgency, frequency, urge incontinence, UTIs  HPI: I saw Kirk Bennett and his partner back in clinic today for follow-up of overactive bladder symptoms.  He is a very challenging historian and much of the history is obtained from his partner and chart review.  Briefly I saw him originally in August 2021 when he was having a few months of urinary urgency, frequency, and urge incontinence requiring depends.  Urinalysis and PVR were normal at that visit.  We tried him on oxybutynin 10 mg XL which resolved his urgency/frequency/incontinence at least temporarily.  He was then having some bothersome side effects of dry eyes and dry mouth, and the dose was lowered to 5 mg XL daily.    It sounds like over the last 8 to 10 months he has had worsening urinary symptoms with urgency, frequency, and incontinence.  He also had 2 culture documented Klebsiella UTIs in February and April with his PCP, but he denies symptoms of dysuria with these infections.  There is no recent cross-sectional imaging to review.  He also has diabetes which likely contributes to his risk of infections and urinary symptoms.  PVR today elevated at 300 mL, unable to urinate.  I recommended further evaluation with cystoscopy in the setting of his UTIs and worsening urgency/frequency/urge incontinence as well as nocturia.  Consider further imaging pending those findings  Billey Co, MD  [place] [address], [place], [place] [address] [phone]

## 2021-04-09 ENCOUNTER — Ambulatory Visit: Payer: 59 | Admitting: Family Medicine

## 2021-04-12 ENCOUNTER — Other Ambulatory Visit: Payer: Self-pay

## 2021-04-12 ENCOUNTER — Ambulatory Visit: Payer: 59 | Admitting: Family Medicine

## 2021-04-12 ENCOUNTER — Encounter: Payer: Self-pay | Admitting: Family Medicine

## 2021-04-12 VITALS — BP 136/74 | HR 67 | Temp 97.1°F | Ht 74.0 in | Wt 234.0 lb

## 2021-04-12 DIAGNOSIS — G40109 Localization-related (focal) (partial) symptomatic epilepsy and epileptic syndromes with simple partial seizures, not intractable, without status epilepticus: Secondary | ICD-10-CM

## 2021-04-12 DIAGNOSIS — N3941 Urge incontinence: Secondary | ICD-10-CM

## 2021-04-12 DIAGNOSIS — R2689 Other abnormalities of gait and mobility: Secondary | ICD-10-CM | POA: Diagnosis not present

## 2021-04-12 DIAGNOSIS — E1121 Type 2 diabetes mellitus with diabetic nephropathy: Secondary | ICD-10-CM

## 2021-04-12 LAB — POCT GLYCOSYLATED HEMOGLOBIN (HGB A1C): Hemoglobin A1C: 7.3 % — AB (ref 4.0–5.6)

## 2021-04-12 NOTE — Progress Notes (Signed)
Patient ID: Kirk Bennett., male    DOB: 04/04/1951, 70 y.o.   MRN: [id number]  This visit was conducted in person.  BP 136/74   Pulse 67   Temp (!) 97.1 F (36.2 C) (Temporal)   Ht '6\' 2"'$  (1.88 m)   Wt 234 lb (106.1 kg)   SpO2 97%   BMI 30.04 kg/m    CC: DM f/u visit  Subjective:   HPI: Kirk Bennett. is a 70 y.o. male presenting on 04/12/2021 for Diabetes (3 mo f/u)   Here with wife, [name]   Saw urology [facility]) for known overactive bladder. Was on oxybutynin XL '10mg'$  but notes side effect of dry mouth/eyes. Planned cystoscopy in setting of prior UTIs and worsening LUTS.   Movement disorder clinic through [facility] Dr Lavena Bullion. Upcoming f/u visit 04/20/2021.  No falls recently  Upcoming [facility] appt tomorrow.   DM - does regularly check sugars - well controlled. Compliant with antihyperglycemic regimen which includes: amaryl '4mg'$  daily with breakfast, faxiga '5mg'$  daily, metformin '1000mg'$  bid. Denies low sugars or hypoglycemic symptoms. Denies paresthesias, blurry vision. Last diabetic eye exam 10/2018 - DUE - will call [facility]. Glucometer brand: one touch. Last foot exam: 12/2020. DSME: completed per patient.  Lab Results  Component Value Date   HGBA1C 7.3 (A) 04/12/2021   Diabetic Foot Exam - Simple   No data filed    Lab Results  Component Value Date   MICROALBUR <0.2 09/11/2015         Relevant past medical, surgical, family and social history reviewed and updated as indicated. Interim medical history since our last visit reviewed. Allergies and medications reviewed and updated. Outpatient Medications Prior to Visit  Medication Sig Dispense Refill   atorvastatin (LIPITOR) 20 MG tablet Take 1 tablet (20 mg total) by mouth daily. 90 tablet 3   Cholecalciferol (VITAMIN D3) 1000 units CAPS Take 1,000 Units by mouth daily.     clotrimazole (LOTRIMIN AF) 1 % cream Apply 1 application topically 2 (two) times daily. 113 g 0   dapagliflozin propanediol  (FARXIGA) 5 MG TABS tablet Take 1 tablet (5 mg total) by mouth daily before breakfast. 30 tablet 6   glimepiride (AMARYL) 4 MG tablet TAKE 1 TABLET BY MOUTH DAILY WITH BREAKFAST 90 tablet 3   glucose blood (ONE TOUCH ULTRA TEST) test strip Use to check sugar fasting in the AM and 2 hours after lunch or supper. Dx: E11.21 100 each 3   levETIRAcetam (KEPPRA) 500 MG tablet Take 2 tablets by mouth 2 (two) times daily.     lisinopril (ZESTRIL) 10 MG tablet TAKE 1 TABLET BY MOUTH TWICE A DAY 180 tablet 3   metFORMIN (GLUCOPHAGE) 1000 MG tablet TAKE 1 TABLET BY MOUTH TWICE A DAY WITH MEALS 180 tablet 3   oxybutynin (DITROPAN-XL) 10 MG 24 hr tablet Take 1 tablet (10 mg total) by mouth at bedtime. 30 tablet 6   No facility-administered medications prior to visit.     Per HPI unless specifically indicated in ROS section below Review of Systems  Objective:  BP 136/74   Pulse 67   Temp (!) 97.1 F (36.2 C) (Temporal)   Ht '6\' 2"'$  (1.88 m)   Wt 234 lb (106.1 kg)   SpO2 97%   BMI 30.04 kg/m   Wt Readings from Last 3 Encounters:  04/12/21 234 lb (106.1 kg)  04/07/21 230 lb (104.3 kg)  01/08/21 236 lb 1 oz (107.1 kg)  Physical Exam Vitals and nursing note reviewed.  Constitutional:      Appearance: Normal appearance. He is not ill-appearing.     Comments: Ambulates with cane.  Stiff slowed wide based gait  Eyes:     Extraocular Movements: Extraocular movements intact.     Conjunctiva/sclera: Conjunctivae normal.     Pupils: Pupils are equal, round, and reactive to light.  Cardiovascular:     Rate and Rhythm: Normal rate and regular rhythm. Occasional Extrasystoles are present.    Pulses: Normal pulses.     Heart sounds: Normal heart sounds. No murmur heard. Pulmonary:     Effort: Pulmonary effort is normal. No respiratory distress.     Breath sounds: Normal breath sounds. No wheezing, rhonchi or rales.  Musculoskeletal:     Right lower leg: No edema.     Left lower leg: No edema.      Comments: See HPI for foot exam if done  Skin:    General: Skin is warm and dry.     Findings: No rash.  Neurological:     Mental Status: He is alert.  Psychiatric:        Mood and Affect: Mood normal.        Behavior: Behavior normal.      Results for orders placed or performed in visit on 04/12/21  POCT glycosylated hemoglobin (Hb A1C)  Result Value Ref Range   Hemoglobin A1C 7.3 (A) 4.0 - 5.6 %   HbA1c POC (<> result, manual entry)     HbA1c, POC (prediabetic range)     HbA1c, POC (controlled diabetic range)     Lab Results  Component Value Date   CREATININE 1.57 (H) 09/25/2020   BUN 18 09/25/2020   NA 138 09/25/2020   K 4.6 09/25/2020   CL 104 09/25/2020   CO2 24 09/25/2020    Assessment & Plan:  This visit occurred during the SARS-CoV-2 public health emergency.  Safety protocols were in place, including screening questions prior to the visit, additional usage of staff PPE, and extensive cleaning of exam room while observing appropriate contact time as indicated for disinfecting solutions.   Problem List Items Addressed This Visit     Type 2 diabetes mellitus with diabetic nephropathy ([place]) - Primary    Chronic, continues improving since restarting farxiga.  Will need Cr checked next visit.  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 Dr Opal Sidles left practice - upcoming appt with general neurology to establish with new doctor.       Urge urinary incontinence    Ongoing trouble, tolerating oxybutynin XL '10mg'$  daily well. Saw urology, planned cystoscopy to further evaluate incontinence in h/o UTIs.       Impaired gait and mobility    Appreciate [facility] movement disorder care. Upcoming appt next week.  He has decided to proceed with DAT scan to  evaluate for PD.  Continues walking with cane. He has undergone physical therapy with benefit.         No orders of the defined types were placed in this encounter.  Orders Placed This Encounter  Procedures   POCT glycosylated hemoglobin (Hb A1C)     Patient Instructions  Call [facility] to schedule eye exam as you're due.  I will follow  along with upcoming movement clinic appointment. Return in 3 months for follow up visit   Follow up plan: Return in about 3 months (around 07/13/2021) for follow up visit.  Ria Bush, MD

## 2021-04-12 NOTE — Patient Instructions (Addendum)
Call [facility] to schedule eye exam as you're due.  I will follow along with upcoming movement clinic appointment. Return in 3 months for follow up visit

## 2021-04-13 ENCOUNTER — Encounter: Payer: Self-pay | Admitting: Family Medicine

## 2021-04-13 NOTE — Assessment & Plan Note (Addendum)
Controlled with keppra '1000mg'$  bid. Dr Opal Sidles left practice - upcoming appt with general neurology to establish with new doctor.

## 2021-04-13 NOTE — Assessment & Plan Note (Addendum)
Chronic, continues improving since restarting farxiga.  Will need Cr checked next visit.  Encouraged he schedule eye exam - they will call [facility] to get this scheduled

## 2021-04-13 NOTE — Assessment & Plan Note (Signed)
Ongoing trouble, tolerating oxybutynin XL '10mg'$  daily well. Saw urology, planned cystoscopy to further evaluate incontinence in h/o UTIs.

## 2021-04-13 NOTE — Assessment & Plan Note (Addendum)
Appreciate [facility] disorder care. Upcoming appt next week.  He has decided to proceed with DAT scan to evaluate for PD.  Continues walking with cane. He has undergone physical therapy with benefit.

## 2021-04-21 ENCOUNTER — Ambulatory Visit: Payer: 59 | Admitting: Urology

## 2021-04-21 ENCOUNTER — Other Ambulatory Visit: Payer: Self-pay

## 2021-04-21 ENCOUNTER — Encounter: Payer: Self-pay | Admitting: Urology

## 2021-04-21 VITALS — BP 126/75 | HR 100 | Ht 74.0 in | Wt 235.0 lb

## 2021-04-21 DIAGNOSIS — N3941 Urge incontinence: Secondary | ICD-10-CM | POA: Diagnosis not present

## 2021-04-21 DIAGNOSIS — N390 Urinary tract infection, site not specified: Secondary | ICD-10-CM

## 2021-04-21 LAB — BLADDER SCAN AMB NON-IMAGING

## 2021-04-21 MED ORDER — SULFAMETHOXAZOLE-TRIMETHOPRIM 800-160 MG PO TABS
1.0000 | ORAL_TABLET | Freq: Once | ORAL | Status: AC
  Administered 2021-04-21: 1 via ORAL

## 2021-04-21 MED ORDER — LIDOCAINE HCL URETHRAL/MUCOSAL 2 % EX GEL: 1.0000 "application " | Freq: Once | CUTANEOUS | Status: DC

## 2021-04-21 NOTE — Progress Notes (Signed)
Cystoscopy Procedure Note:  Indication: Urinary incontinence  PSA normal at 1.6 in February 2022.  MRI brain and spine both normal.  Challenging historian.  He complains of both incontinence without realization and waking up wet with depends overnight, but also complains of inability to urinate.  His wife is unable to contribute significantly to the history.  Bladder scan 215 mL currently, and he denies urge to void.  He is also being worked up for possible Parkinson's via the movement disorders clinic and has an upcoming appointment.  After informed consent and discussion of the procedure and its risks, Kirk Bennett. was positioned and prepped in the standard fashion. Cystoscopy was performed with a flexible cystoscope. The urethra, bladder neck and entire bladder was visualized in a standard fashion. The prostate was moderate in size with a high bladder neck. The ureteral orifices were visualized in their normal location and orientation.  Bladder mucosa grossly normal throughout, no abnormalities on retroflexion.  Imaging: Prostate measures 24 g on CT from 2016  Findings: Normal cystoscopy  Assessment and Plan: Challenging patient with mixed symptoms of incontinence without realization as well as some urgency.  Difficult historian.  Unfortunately, I think he may have a neurologic condition that is contributing to his urinary symptoms, and we discussed at length that it can be very difficult to improve incontinence without realization or sensation.  Encouraged timed voiding, changed oxybutynin to Myrbetriq RTC 1 month and symptom check  Nickolas Madrid, MD 04/21/2021

## 2021-05-05 ENCOUNTER — Other Ambulatory Visit: Payer: Self-pay | Admitting: Family Medicine

## 2021-05-10 LAB — HM DIABETES EYE EXAM

## 2021-05-12 ENCOUNTER — Encounter: Payer: Self-pay | Admitting: Family Medicine

## 2021-05-17 ENCOUNTER — Telehealth: Payer: Self-pay | Admitting: Urology

## 2021-05-17 NOTE — Telephone Encounter (Signed)
Patient called at 4:46 on 05/17/21 asking for more samples of Myrbetriq. The office was closed so I let him know that I would need to send a message and that someone would get back with him tomorrow.   Thanks, Sharyn Lull

## 2021-05-18 NOTE — Telephone Encounter (Signed)
Called pt's home number no answer. Called pt's mobile number no answer. LM asking pt to call back to confirm what dose of Myrbetriq he is currently taking so that RX can be sent in.

## 2021-05-22 ENCOUNTER — Other Ambulatory Visit: Payer: Self-pay | Admitting: Family Medicine

## 2021-05-26 ENCOUNTER — Other Ambulatory Visit: Payer: Self-pay

## 2021-05-26 ENCOUNTER — Ambulatory Visit: Payer: 59 | Admitting: Urology

## 2021-05-26 ENCOUNTER — Encounter: Payer: Self-pay | Admitting: Urology

## 2021-05-26 DIAGNOSIS — N3941 Urge incontinence: Secondary | ICD-10-CM

## 2021-05-26 DIAGNOSIS — N3281 Overactive bladder: Secondary | ICD-10-CM

## 2021-05-26 LAB — BLADDER SCAN AMB NON-IMAGING

## 2021-05-26 NOTE — Patient Instructions (Signed)

## 2021-05-26 NOTE — Progress Notes (Signed)
   05/26/2021 2:41 PM   Kirk Bennett. 1951-03-17 [id number]  Reason for visit: Follow up overactive bladder  HPI: Challenging 70 year old patient who is a difficult historian.  I originally saw him in August 2021 when he was having frequency, urgency, and urge incontinence that responded very well to oxybutynin 10 mg XL.  He then had worsening symptoms over the last year despite this medication as well as some symptoms of dry eyes and dry mouth.  He had 2 prior positive urine cultures, but no UTI symptoms of dysuria at that time, and no change in his symptoms on antibiotics.  Suspect this may represent colonization.  He underwent a cystoscopy in August 2022 that showed a moderate size prostate with a high bladder neck, but the bladder was grossly normal throughout, and prostate measured 24 g on his CT in 2016.  He also has diabetes with persistent glucosuria, and recent hemoglobin A1c of 7.6 that likely contributes to his urinary frequency as well.  Most importantly, he is currently undergoing work-up with neurology for a movement disorder/parkinsonism, and is awaiting results of a brain imaging test performed last week.  I have a high suspicion that his bladder symptoms are related to his movement disorder/parkinsonism, and represent overactive bladder.  I am hopeful that if he is able to undergo any treatments that address this neurologic abnormality may improve his bladder symptoms as well.  At our last visit, we trialed Myrbetriq 50 mg daily which has moderately improved his urinary symptoms.  He ran out of the samples.  We discussed the differences between [facility], and he is interested in trying the [place] to see which one helps the most.  6 weeks of Gemtesa samples given.  We discussed alternative options in the future including PTNS which I think may be helpful for him.  I do not think he would tolerate urodynamics well based on his poor tolerance of cystoscopy.  -Trial of  Gemtesa 75 mg daily, 6 weeks samples given -RTC 1 month symptom check, consider PTNS at that time if persistently bothersome urinary symptoms   Billey Co, MD  [facility] [address], [place], [place] [address] [phone]

## 2021-06-01 LAB — BASIC METABOLIC PANEL
BUN: 19 (ref 4–21)
Creatinine: 1.5 — AB (ref 0.6–1.3)
Glucose: 372
Potassium: 4.3 (ref 3.4–5.3)
Sodium: 136 — AB (ref 137–147)

## 2021-06-01 LAB — CBC AND DIFFERENTIAL
Hemoglobin: 15.1 (ref 13.5–17.5)
Platelets: 179 (ref 150–399)
WBC: 6.2

## 2021-06-10 ENCOUNTER — Encounter: Payer: Self-pay | Admitting: *Deleted

## 2021-06-11 ENCOUNTER — Ambulatory Visit: Admission: RE | Admit: 2021-06-11 | Payer: 59 | Source: Home / Self Care

## 2021-06-11 ENCOUNTER — Encounter: Admission: RE | Payer: Self-pay | Source: Home / Self Care

## 2021-06-11 SURGERY — COLONOSCOPY WITH PROPOFOL
Anesthesia: General

## 2021-06-21 ENCOUNTER — Other Ambulatory Visit: Payer: Self-pay | Admitting: Family Medicine

## 2021-06-22 NOTE — Progress Notes (Signed)
06/23/2021 1:26 PM   [name] 1951-02-12 [id number]  Referring provider: Ria Bush, MD [address] [place],  [place] [address]  Chief Complaint  Patient presents with   Over Active Bladder   Urological history: 1. Urge incontinence -contributing factors of age, Parkinson's disease, diabetes, HTN, depression and obesity -cysto 2022 - moderate size prostate with a high bladder neck, but the bladder was grossly normal throughout -PVR 177 mL -failed tamsulosin 0.4 mg daily -oxybutynin intolerable side effects -Myrbetriq cost prohibitive   HPI: Kirk Bennettis a 70 y.o. male who presents today for a one month follow up after a trial of Gemtesa 75 mg daily with his wife, Kirk Bennett.    He has not noticed any difference in his urinary issues while taking the [place]  Patient denies any modifying or aggravating factors.  Patient denies any gross hematuria, dysuria or suprapubic/flank pain.  Patient denies any fevers, chills, nausea or vomiting.    His wife would like for him to try the PTNS.   PMH: Past Medical History:  Diagnosis Date   Cataracts, bilateral 02/2011   and suspected glaucoma, to return for f/u, no diabetic retinopathy   CKD stage 3 due to type 2 diabetes mellitus ([place]) 2015   normal renal US, self referred to Dr Holley Raring   Complex partial seizures ([place]) 1990   from surgery for R temporal arachnoid cyst s/p drainage, possible continued sz so changed to lamotrigine (Dr. Mora Bellman at [facility])   Depression    found by neuro   Elevated PSA 05/27/2015   Serial monitoring ([place])    Glaucoma 2015   suspect   History of hepatitis A    HLD (hyperlipidemia)    HTN (hypertension)    Knee pain    s/p replacement   SVT (supraventricular tachycardia) ([place]) 1996   Vitamin D deficiency 03/02/2015   Well controlled type 2 diabetes mellitus with nephropathy ([place]) 1996   established with Dr. Gabriel Carina endo --> 02/2016 decided to return to PCP for DM care     Surgical History: Past Surgical History:  Procedure Laterality Date   CARDIAC CATHETERIZATION  July 2010   No blockages (Dr. Liliane Shi)   COLONOSCOPY  09/16/2005   hyperplastic polyps, rpt due 10 yrs    COLONOSCOPY WITH PROPOFOL N/A 12/11/2015   mult polyps, few TA, rpt 81yrs ([place])   corrective surgery amblyopia  1957   Cystectomy or meningioma removal brain  1990   (unclear)   Left knee surgery  1971   Torn ACL   REPLACEMENT TOTAL KNEE  April 2011   Left [facility] Dr. Garald Balding)    Home Medications:  Allergies as of 06/23/2021       Reactions   Bee Venom Shortness Of Breath   Demerol [meperidine Hcl] Nausea And Vomiting        Medication List        Accurate as of June 23, 2021  1:26 PM. If you have any questions, ask your nurse or doctor.          atorvastatin 20 MG tablet Commonly known as: LIPITOR Take 1 tablet (20 mg total) by mouth daily.   carbidopa-levodopa 25-100 MG tablet Commonly known as: SINEMET IR PLEASE SEE ATTACHED FOR DETAILED DIRECTIONS   clotrimazole 1 % cream Commonly known as: Lotrimin AF Apply 1 application topically 2 (two) times daily.   Farxiga 5 MG Tabs tablet Generic drug: dapagliflozin propanediol TAKE 1 TABLET BY MOUTH EVERY DAY  BEFORE BREAKFAST   glimepiride 4 MG tablet Commonly known as: AMARYL TAKE 1 TABLET BY MOUTH DAILY WITH BREAKFAST   glucose blood test strip Commonly known as: ONE TOUCH ULTRA TEST Use to check sugar fasting in the AM and 2 hours after lunch or supper. Dx: E11.21   levETIRAcetam 1000 MG tablet Commonly known as: KEPPRA Take 1,000 mg by mouth 2 (two) times daily.   lisinopril 10 MG tablet Commonly known as: ZESTRIL TAKE 1 TABLET BY MOUTH TWICE A DAY   metFORMIN 1000 MG tablet Commonly known as: GLUCOPHAGE TAKE 1 TABLET BY MOUTH TWICE A DAY WITH MEALS   Vitamin D3 25 MCG (1000 UT) Caps Take 1,000 Units by mouth daily.        Allergies:  Allergies  Allergen Reactions   Bee  Venom Shortness Of Breath   Demerol [Meperidine Hcl] Nausea And Vomiting    Family History: Family History  Problem Relation Age of Onset   Heart failure Father    Cancer Mother        Lung, smoker   Aortic dissection Mother        Aortic rupture   Mental illness Sister        64 years old   Diabetes Maternal Aunt    Diabetes Cousin    Cancer Cousin        Colon age 81   CAD Neg Hx     Social History:  reports that he has never smoked. He has never used smokeless tobacco. He reports that he does not drink alcohol and does not use drugs.  ROS: Pertinent ROS in HPI  Physical Exam: BP 123/72   Pulse (!) 105   Ht 6\' 2"  (1.88 m)   Wt 213 lb (96.6 kg)   BMI 27.35 kg/m   Constitutional:  Well nourished. Alert and oriented, No acute distress. HEENT: [place] AT, mask in place.  Trachea midline Cardiovascular: No clubbing, cyanosis, or edema. Respiratory: Normal respiratory effort, no increased work of breathing. Neurologic: Grossly intact, no focal deficits, moving all 4 extremities. Psychiatric: Normal mood and affect.  Laboratory Data: Lab Results  Component Value Date   WBC 5.9 09/25/2020   HGB 13.5 09/25/2020   HCT 40.8 09/25/2020   MCV 85.4 09/25/2020   PLT 202 09/25/2020    Lab Results  Component Value Date   CREATININE 1.57 (H) 09/25/2020    Lab Results  Component Value Date   PSA 1.21 09/25/2020   PSA 1.6 09/20/2019   PSA 2.0 09/18/2018    Lab Results  Component Value Date   HGBA1C 7.3 (A) 04/12/2021        Component Value Date/Time   CHOL 126 09/25/2020 1106   CHOL 96 11/21/2011 0000   HDL 31 (L) 09/25/2020 1106   CHOLHDL 4.1 09/25/2020 1106   VLDL 19 09/05/2016 0737   LDLCALC 77 09/25/2020 1106    Lab Results  Component Value Date   AST 12 09/25/2020   Lab Results  Component Value Date   ALT 11 09/25/2020    Urinalysis    Component Value Date/Time   COLORURINE YELLOW 12/01/2020 1254   APPEARANCEUR CLOUDY (A) 12/01/2020 1254    LABSPEC 1.018 12/01/2020 1254   PHURINE 5.5 12/01/2020 1254   GLUCOSEU 3+ (A) 12/01/2020 1254   HGBUR NEGATIVE 12/01/2020 1254   BILIRUBINUR negative 10/06/2020 1144   KETONESUR NEGATIVE 12/01/2020 1254   PROTEINUR NEGATIVE 12/01/2020 1254   UROBILINOGEN 0.2 10/06/2020 1144   NITRITE NEGATIVE 12/01/2020  [place] 12/01/2020 1254  I have reviewed the labs.   Pertinent Imaging: Results for RAHEEM, KOLBE" (MRN [id number]) as of 06/23/2021 13:25  Ref. Range 06/23/2021 13:05  Scan Result Unknown 165mL    Assessment & Plan:    1. Urge incontinence -Failed tamsulosin, oxybutynin, Myrbetriq and Gemtesa -Would like to try PTNS at this time -explained the PTNS provides treatment by indirectly providing electrical stimulation to the nerves responsible for bladder and pelvic floor function - a needle electrode generates an adjustable electrical pulse that travels to the sacral plexus via the tibial nerve which is located in the ankle, among other functions, the sacral nerve plexus regulates bladder and pelvic floor function - treatment protocol requires once-a-week treatments for 12 weeks, 30 minutes per session and many patients begin to see improvements by the 6th treatment. Patients who respond to treatment may require occasional treatments (~ once every 3 weeks) to sustain improvements. PTNS is a low-risk procedure. The most common side-effects with PTNS treatment are temporary and minor, resulting from the placement of the needle electrode. They include minor bleeding, mild pain and skin inflammation and patients have seen up to an 80% success rate with this form of treatment  Return for PTNS .  These notes generated with voice recognition software. I apologize for typographical errors.  Zara Council, PA-C  [facility] [address]  [place], [place] [address] [phone]

## 2021-06-23 ENCOUNTER — Encounter: Payer: Self-pay | Admitting: Urology

## 2021-06-23 ENCOUNTER — Other Ambulatory Visit: Payer: Self-pay

## 2021-06-23 ENCOUNTER — Ambulatory Visit: Payer: 59 | Admitting: Urology

## 2021-06-23 VITALS — BP 123/72 | HR 105 | Ht 74.0 in | Wt 213.0 lb

## 2021-06-23 DIAGNOSIS — N3941 Urge incontinence: Secondary | ICD-10-CM | POA: Diagnosis not present

## 2021-06-23 LAB — BLADDER SCAN AMB NON-IMAGING

## 2021-07-05 ENCOUNTER — Encounter: Payer: Self-pay | Admitting: Family Medicine

## 2021-07-06 ENCOUNTER — Telehealth: Payer: Self-pay

## 2021-07-06 NOTE — Telephone Encounter (Signed)
Spoke with Vara Guardian. At [employer]. No PA required. Call reference # [id number]. Patient scheduled for 12 PTNS visits.

## 2021-07-06 NOTE — Telephone Encounter (Signed)
-----   Message from Nori Riis, PA-C sent at 06/23/2021  1:16 PM EDT ----- Regarding: PTNS Would you check on insurance coverage for his PTNS?

## 2021-07-13 ENCOUNTER — Other Ambulatory Visit: Payer: Self-pay

## 2021-07-13 ENCOUNTER — Encounter: Payer: Self-pay | Admitting: Family Medicine

## 2021-07-13 ENCOUNTER — Ambulatory Visit: Payer: 59 | Admitting: Family Medicine

## 2021-07-13 ENCOUNTER — Other Ambulatory Visit: Payer: Self-pay | Admitting: Family Medicine

## 2021-07-13 VITALS — BP 130/64 | HR 66 | Temp 97.4°F | Ht 74.0 in | Wt 234.3 lb

## 2021-07-13 DIAGNOSIS — Z23 Encounter for immunization: Secondary | ICD-10-CM

## 2021-07-13 DIAGNOSIS — E1121 Type 2 diabetes mellitus with diabetic nephropathy: Secondary | ICD-10-CM

## 2021-07-13 DIAGNOSIS — K760 Fatty (change of) liver, not elsewhere classified: Secondary | ICD-10-CM | POA: Diagnosis not present

## 2021-07-13 DIAGNOSIS — I499 Cardiac arrhythmia, unspecified: Secondary | ICD-10-CM

## 2021-07-13 DIAGNOSIS — N3941 Urge incontinence: Secondary | ICD-10-CM

## 2021-07-13 DIAGNOSIS — N1831 Chronic kidney disease, stage 3a: Secondary | ICD-10-CM | POA: Diagnosis not present

## 2021-07-13 DIAGNOSIS — R2689 Other abnormalities of gait and mobility: Secondary | ICD-10-CM

## 2021-07-13 DIAGNOSIS — I444 Left anterior fascicular block: Secondary | ICD-10-CM

## 2021-07-13 DIAGNOSIS — E1140 Type 2 diabetes mellitus with diabetic neuropathy, unspecified: Secondary | ICD-10-CM

## 2021-07-13 LAB — POCT GLYCOSYLATED HEMOGLOBIN (HGB A1C): Hemoglobin A1C: 11 % — AB (ref 4.0–5.6)

## 2021-07-13 MED ORDER — BASAGLAR KWIKPEN 100 UNIT/ML ~~LOC~~ SOPN: 10.0000 [IU] | PEN_INJECTOR | Freq: Every day | SUBCUTANEOUS | 3 refills | Status: DC

## 2021-07-13 NOTE — Patient Instructions (Addendum)
Flu shot today  EKG today  Start lantus 10 units daily in place of glimepiride.  Titrate lantus by 2 units every 2 days if average fasting sugar stays >150, to goal 30 units daily.  Return in 6 weeks for diabetes follow up visit with sugar log and in 3 months for physical.  Look at diabetes.org for further resources.   Diabetes Mellitus and Nutrition, Adult When you have diabetes, or diabetes mellitus, it is very important to have healthy eating habits because your blood sugar (glucose) levels are greatly affected by what you eat and drink. Eating healthy foods in the right amounts, at about the same times every day, can help you: Manage your blood glucose. Lower your risk of heart disease. Improve your blood pressure. Reach or maintain a healthy weight. What can affect my meal plan? Every person with diabetes is different, and each person has different needs for a meal plan. Your health care provider may recommend that you work with a dietitian to make a meal plan that is best for you. Your meal plan may vary depending on factors such as: The calories you need. The medicines you take. Your weight. Your blood glucose, blood pressure, and cholesterol levels. Your activity level. Other health conditions you have, such as heart or kidney disease. How do carbohydrates affect me? Carbohydrates, also called carbs, affect your blood glucose level more than any other type of food. Eating carbs raises the amount of glucose in your blood. It is important to know how many carbs you can safely have in each meal. This is different for every person. Your dietitian can help you calculate how many carbs you should have at each meal and for each snack. How does alcohol affect me? Alcohol can cause a decrease in blood glucose (hypoglycemia), especially if you use insulin or take certain diabetes medicines by mouth. Hypoglycemia can be a life-threatening condition. Symptoms of hypoglycemia, such as sleepiness,  dizziness, and confusion, are similar to symptoms of having too much alcohol. Do not drink alcohol if: Your health care provider tells you not to drink. You are pregnant, may be pregnant, or are planning to become pregnant. If you drink alcohol: Limit how much you have to: 0-1 drink a day for women. 0-2 drinks a day for men. Know how much alcohol is in your drink. In the [place] one drink equals one 12 oz bottle of beer (355 mL), one 5 oz glass of wine (148 mL), or one 1 oz glass of hard liquor (44 mL). Keep yourself hydrated with water, diet soda, or unsweetened iced tea. Keep in mind that regular soda, juice, and other mixers may contain a lot of sugar and must be counted as carbs. What are tips for following this plan? Reading food labels Start by checking the serving size on the Nutrition Facts label of packaged foods and drinks. The number of calories and the amount of carbs, fats, and other nutrients listed on the label are based on one serving of the item. Many items contain more than one serving per package. Check the total grams (g) of carbs in one serving. Check the number of grams of saturated fats and trans fats in one serving. Choose foods that have a low amount or none of these fats. Check the number of milligrams (mg) of salt (sodium) in one serving. Most people should limit total sodium intake to less than 2,300 mg per day. Always check the nutrition information of foods labeled as "low-fat" or "nonfat."  These foods may be higher in added sugar or refined carbs and should be avoided. Talk to your dietitian to identify your daily goals for nutrients listed on the label. Shopping Avoid buying canned, pre-made, or processed foods. These foods tend to be high in fat, sodium, and added sugar. Shop around the outside edge of the grocery store. This is where you will most often find fresh fruits and vegetables, bulk grains, fresh meats, and fresh dairy products. Cooking Use low-heat  cooking methods, such as baking, instead of high-heat cooking methods, such as deep frying. Cook using healthy oils, such as olive, canola, or sunflower oil. Avoid cooking with butter, cream, or high-fat meats. Meal planning Eat meals and snacks regularly, preferably at the same times every day. Avoid going long periods of time without eating. Eat foods that are high in fiber, such as fresh fruits, vegetables, beans, and whole grains. Eat 4-6 oz (112-168 g) of lean protein each day, such as lean meat, chicken, fish, eggs, or tofu. One ounce (oz) (28 g) of lean protein is equal to: 1 oz (28 g) of meat, chicken, or fish. 1 egg.  cup (62 g) of tofu. Eat some foods each day that contain healthy fats, such as avocado, nuts, seeds, and fish. What foods should I eat? Fruits Berries. Apples. Oranges. Peaches. Apricots. Plums. Grapes. Mangoes. Papayas. Pomegranates. Kiwi. Cherries. Vegetables Leafy greens, including lettuce, spinach, kale, chard, collard greens, mustard greens, and cabbage. Beets. Cauliflower. Broccoli. Carrots. Green beans. Tomatoes. Peppers. Onions. Cucumbers. Brussels sprouts. Grains Whole grains, such as whole-wheat or whole-grain bread, crackers, tortillas, cereal, and pasta. Unsweetened oatmeal. Quinoa. Brown or wild rice. Meats and other proteins Seafood. Poultry without skin. Lean cuts of poultry and beef. Tofu. Nuts. Seeds. Dairy Low-fat or fat-free dairy products such as milk, yogurt, and cheese. The items listed above may not be a complete list of foods and beverages you can eat and drink. Contact a dietitian for more information. What foods should I avoid? Fruits Fruits canned with syrup. Vegetables Canned vegetables. Frozen vegetables with butter or cream sauce. Grains Refined white flour and flour products such as bread, pasta, snack foods, and cereals. Avoid all processed foods. Meats and other proteins Fatty cuts of meat. Poultry with skin. Breaded or fried  meats. Processed meat. Avoid saturated fats. Dairy Full-fat yogurt, cheese, or milk. Beverages Sweetened drinks, such as soda or iced tea. The items listed above may not be a complete list of foods and beverages you should avoid. Contact a dietitian for more information. Questions to ask a health care provider Do I need to meet with a certified diabetes care and education specialist? Do I need to meet with a dietitian? What number can I call if I have questions? When are the best times to check my blood glucose? Where to find more information: American Diabetes Association: diabetes.org Academy of Nutrition and Dietetics: eatright.[employer] of Diabetes and Digestive and Kidney Diseases: [employer]is Association of Diabetes Care & Education Specialists: diabeteseducator.org Summary It is important to have healthy eating habits because your blood sugar (glucose) levels are greatly affected by what you eat and drink. It is important to use alcohol carefully. A healthy meal plan will help you manage your blood glucose and lower your risk of heart disease. Your health care provider may recommend that you work with a dietitian to make a meal plan that is best for you. This information is not intended to replace advice given to you by your health care  provider. Make sure you discuss any questions you have with your health care provider. Document Revised: 03/11/2020 Document Reviewed: 03/11/2020 Elsevier Patient Education  [place].

## 2021-07-13 NOTE — Progress Notes (Signed)
Patient ID: Kirk Bennett., male    DOB: [birth year], 70 y.o.   MRN: [id number]  This visit was conducted in person.  BP 130/64   Pulse 66   Temp (!) 97.4 F (36.3 C) (Temporal)   Ht _0  (1.88 m)   Wt 234 lb 5 oz (106.3 kg)   SpO2 97%   BMI 30.08 kg/m    CC: DM f/u visit  Subjective:   HPI: Kirk Bennett. is a 70 y.o. male presenting on 07/13/2021 for Diabetes (Here for 3 mo f/u. Pt accompanied by wife, Kirk Bennett. )   No palpitations, no dyspnea.   Recent diagnosis Parkinsonism at [facility] Disorder clinic with abnormal DatScan 04/2021 - started on sinemet. 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 Rec 2-3 mo f/u with Dr Laurance Flatten. Currently taking sinemet 1 tablet in am, 1 at noon and 1/2 tab at night time. Hasn't yet noticed significant benefit.   Seizure disorder - followed by [facility] Dr Lisabeth Devoid on keppra 1043m bid.   Overactive bladder with urge incontinence - saw urology earlier this month - flomax ineffective and oxybutynin was intolerable, Myrbetriq was unaffordable. No noted benefit on Gemtesa. Planning to try PTNS. Reassuring cystoscopy 03/2021.   DM - does not regularly check sugars, last checked yesterday and normal. Compliant with antihyperglycemic regimen which includes: farxiga 596mdaily, amaryl 43m62maily, metformin 1000m23md. Denies low sugars or hypoglycemic symptoms. Denies paresthesias, blurry vision. Last diabetic eye exam 04/2021. Glucometer brand: Onetouch. Last foot exam: 12/2020. DSME: completed previously per pt.  Lab Results  Component Value Date   HGBA1C 11.0 (A) 07/13/2021   Diabetic Foot Exam - Simple   No data filed    Lab Results  Component Value Date   MICROALBUR <0.2 09/11/2015       Relevant past medical, surgical, family and social history reviewed and updated as indicated. Interim medical history since our last visit reviewed. Allergies and medications reviewed and updated. Outpatient Medications Prior to Visit  Medication Sig  Dispense Refill   atorvastatin (LIPITOR) 20 MG tablet Take 1 tablet (20 mg total) by mouth daily. 90 tablet 3   carbidopa-levodopa (SINEMET IR) 25-100 MG tablet PLEASE SEE ATTACHED FOR DETAILED DIRECTIONS     Cholecalciferol (VITAMIN D3) 1000 units CAPS Take 1,000 Units by mouth daily.     clotrimazole (LOTRIMIN AF) 1 % cream Apply 1 application topically 2 (two) times daily. 113 g 0   FARXIGA 5 MG TABS tablet TAKE 1 TABLET BY MOUTH EVERY DAY BEFORE BREAKFAST 30 tablet 6   glucose blood (ONE TOUCH ULTRA TEST) test strip Use to check sugar fasting in the AM and 2 hours after lunch or supper. Dx: E11.21 100 each 3   levETIRAcetam (KEPPRA) 1000 MG tablet Take 1,000 mg by mouth 2 (two) times daily.     lisinopril (ZESTRIL) 10 MG tablet TAKE 1 TABLET BY MOUTH TWICE A DAY 180 tablet 3   metFORMIN (GLUCOPHAGE) 1000 MG tablet TAKE 1 TABLET BY MOUTH TWICE A DAY WITH MEALS 180 tablet 3   glimepiride (AMARYL) 4 MG tablet TAKE 1 TABLET BY MOUTH DAILY WITH BREAKFAST 90 tablet 3   Facility-Administered Medications Prior to Visit  Medication Dose Route Frequency Provider Last Rate Last Admin   lidocaine (XYLOCAINE) 2 % jelly 1 application  1 application Urethral Once [place]         Per HPI unless specifically indicated in ROS section below Review of Systems  Objective:  BP 130/64   Pulse 66   Temp (!) 97.4 F (36.3 C) (Temporal)   Ht _0  (1.88 m)   Wt 234 lb 5 oz (106.3 kg)   SpO2 97%   BMI 30.08 kg/m   Wt Readings from Last 3 Encounters:  07/13/21 234 lb 5 oz (106.3 kg)  06/23/21 213 lb (96.6 kg)  04/21/21 235 lb (106.6 kg)      Physical Exam Vitals and nursing note reviewed.  Constitutional:      Appearance: Normal appearance. He is not ill-appearing.  Eyes:     Extraocular Movements: Extraocular movements intact.     Conjunctiva/sclera: Conjunctivae normal.     Pupils: Pupils are equal, round, and reactive to light.  Cardiovascular:     Rate and Rhythm: Normal rate  and regular rhythm. FrequentExtrasystoles are present.    Pulses: Normal pulses.     Heart sounds: Normal heart sounds. No murmur heard. Pulmonary:     Effort: Pulmonary effort is normal. No respiratory distress.     Breath sounds: Normal breath sounds. No wheezing, rhonchi or rales.  Abdominal:     General: Abdomen is protuberant. Bowel sounds are normal. There is distension (abdomen).     Tenderness: There is no abdominal tenderness. There is no guarding or rebound. Negative signs include Murphy's sign.  Musculoskeletal:     Right lower leg: No edema.     Left lower leg: No edema.     Comments: See HPI for foot exam if done  Skin:    General: Skin is warm and dry.     Findings: No rash.  Neurological:     Mental Status: He is alert.  Psychiatric:        Mood and Affect: Mood normal.        Behavior: Behavior normal.      Results for orders placed or performed in visit on 07/13/21  POCT glycosylated hemoglobin (Hb A1C)  Result Value Ref Range   Hemoglobin A1C 11.0 (A) 4.0 - 5.6 %   HbA1c POC (<> result, manual entry)     HbA1c, POC (prediabetic range)     HbA1c, POC (controlled diabetic range)     EKG - NSR rate 90s, frequent PVCs, LAD, no hypertrophy or acute ST/T changes, poor R wave progression, largely unchanged from prior.   Assessment & Plan:  This visit occurred during the SARS-CoV-2 public health emergency.  Safety protocols were in place, including screening questions prior to the visit, additional usage of staff PPE, and extensive cleaning of exam room while observing appropriate contact time as indicated for disinfecting solutions.   Problem List Items Addressed This Visit     Type 2 diabetes mellitus with diabetic nephropathy ([place]) - Primary    Chronic, deteriorated as evidenced by A1c. Has not been regularly checking sugars. Given worsened control, continue metformin and farxiga, change amaryl to basaglar 10u daily, with slow titration to effect, max 30u daily.  RTC 6 wks closer DM f/u visit.       Relevant Medications   Insulin Glargine (BASAGLAR KWIKPEN) 100 UNIT/ML   Other Relevant Orders   POCT glycosylated hemoglobin (Hb A1C) (Completed)   Stage 3 chronic kidney disease (HCC)    Recent kidney function stable with eGFR 50 followed by nephrologist.      NAFLD (nonalcoholic fatty liver disease)    Continue to monitor with chronic distended abdomen.       LAFB (left anterior fascicular block)  Update EKG with arrhythmia noted on exam - frequent extrasystoles      Urge urinary incontinence    Appreciate uro care - planning to try PTNS      Impaired gait and mobility    Followed by [facility], recent dx Parkinsonisms started on sinemet. Had abnormal DatScan 04/2021      Type 2 diabetes mellitus with diabetic neuropathy, unspecified (HCC)   Relevant Medications   Insulin Glargine (BASAGLAR KWIKPEN) 100 UNIT/ML   Irregular heart beat    EKG - trigeminy with PVCs H/o normal echocardiogram 2021.       Relevant Orders   EKG 12-Lead (Completed)   Other Visit Diagnoses     Need for influenza vaccination       Relevant Orders   Flu Vaccine QUAD High Dose(Fluad) (Completed)        Meds ordered this encounter  Medications   Insulin Glargine (BASAGLAR KWIKPEN) 100 UNIT/ML    Sig: Inject 10 Units into the skin daily.    Dispense:  3 mL    Refill:  3    Orders Placed This Encounter  Procedures   Flu Vaccine QUAD High Dose(Fluad)   POCT glycosylated hemoglobin (Hb A1C)   EKG 12-Lead     Patient instructions: Flu shot today  EKG today  Start lantus 10 units daily in place of glimepiride.  Titrate lantus by 2 units every 2 days if average fasting sugar stays >150, to goal 30 units daily.  Return in 6 weeks for diabetes follow up visit with sugar log and in 3 months for physical.  Look at diabetes.org for further resources.   Follow up plan: Return in about 6 weeks (around 08/24/2021), or if symptoms worsen or  fail to improve, for follow up visit.  Ria Bush, MD

## 2021-07-19 ENCOUNTER — Ambulatory Visit (INDEPENDENT_AMBULATORY_CARE_PROVIDER_SITE_OTHER): Payer: 59

## 2021-07-19 ENCOUNTER — Telehealth: Payer: Self-pay | Admitting: Family Medicine

## 2021-07-19 ENCOUNTER — Other Ambulatory Visit: Payer: Self-pay

## 2021-07-19 DIAGNOSIS — E1121 Type 2 diabetes mellitus with diabetic nephropathy: Secondary | ICD-10-CM

## 2021-07-19 DIAGNOSIS — N3941 Urge incontinence: Secondary | ICD-10-CM

## 2021-07-19 DIAGNOSIS — N3281 Overactive bladder: Secondary | ICD-10-CM

## 2021-07-19 DIAGNOSIS — I499 Cardiac arrhythmia, unspecified: Secondary | ICD-10-CM | POA: Insufficient documentation

## 2021-07-19 MED ORDER — INSULIN PEN NEEDLE 31G X 8 MM MISC: 3 refills | Status: DC

## 2021-07-19 MED ORDER — BD PEN NEEDLE SHORT U/F 31G X 8 MM MISC: 3 refills | Status: DC

## 2021-07-19 NOTE — Telephone Encounter (Signed)
Pt called in stated  his Blood sugar is running around 273 . Requesting a proscription for insulin needles Please advise [phone]

## 2021-07-19 NOTE — Patient Instructions (Signed)
Tracking Your Bladder Symptoms    Patient Name:___________________________________________________   Sample: Day   Daytime Voids  Nighttime Voids Urgency for the Day(0-4) Number of Accidents Beverage Comments  Monday IIII II 2 I Water IIII Coffee  I      Week Starting:____________________________________   Day Daytime  Voids Nighttime  Voids Urgency for  The Day(0-4) Number of Accidents Beverages Comments                                                           This week my symptoms were:  O much better  O better O the same O worse   

## 2021-07-19 NOTE — Progress Notes (Signed)
PTNS  Session # 1  Health & Social Factors: Parkinson's disease, diabetes, HTN, depression and obesity Caffeine: 1 Alcohol: 0 Daytime voids #per day: 5 Night-time voids #per night: 5 Urgency: Mild Incontinence Episodes #per day: 3 Ankle used: Left Treatment Setting: 3 Feeling/ Response: Sensory & Toe Flex  Comments: PTNS consent signed, Pt given voiding diary.   Performed By: Gordy Clement, CMA  Follow Up: RTC in 1 week

## 2021-07-19 NOTE — Assessment & Plan Note (Signed)
Chronic, deteriorated as evidenced by A1c. Has not been regularly checking sugars. Given worsened control, continue metformin and farxiga, change amaryl to basaglar 10u daily, with slow titration to effect, max 30u daily. RTC 6 wks closer DM f/u visit.

## 2021-07-19 NOTE — Assessment & Plan Note (Signed)
Followed by [facility], recent dx Parkinsonisms started on sinemet. Had abnormal DatScan 04/2021

## 2021-07-19 NOTE — Telephone Encounter (Signed)
E-scribed pen needle rx.   Fwd msg to Dr. Darnell Level.

## 2021-07-19 NOTE — Telephone Encounter (Addendum)
Thank you for sending in.  Plan is to start basaglar 10u daily with slow titration as needed 2 units every 2 days if average fasting sugar stays >150 to goal 30u.

## 2021-07-19 NOTE — Assessment & Plan Note (Signed)
Appreciate uro care - planning to try PTNS

## 2021-07-19 NOTE — Assessment & Plan Note (Signed)
EKG - trigeminy with PVCs H/o normal echocardiogram 2021.

## 2021-07-19 NOTE — Assessment & Plan Note (Signed)
Recent kidney function stable with eGFR 50 followed by nephrologist.

## 2021-07-19 NOTE — Assessment & Plan Note (Addendum)
Continue to monitor with chronic distended abdomen.

## 2021-07-19 NOTE — Assessment & Plan Note (Addendum)
Update EKG with arrhythmia noted on exam - frequent extrasystoles

## 2021-07-19 NOTE — Addendum Note (Signed)
Addended by: Brenton Grills on: 72/62/0355 01:41 PM   Modules accepted: Orders

## 2021-07-20 NOTE — Telephone Encounter (Signed)
Spoke with pt relaying Dr. G's message. Pt verbalizes understanding.  

## 2021-07-26 ENCOUNTER — Ambulatory Visit: Payer: 59

## 2021-07-27 ENCOUNTER — Encounter: Payer: Self-pay | Admitting: Urology

## 2021-07-27 NOTE — Telephone Encounter (Signed)
Lvm asking pt to call back.  Need to relay Dr. G's message.  

## 2021-07-27 NOTE — Telephone Encounter (Signed)
Please clarify - he should have been doing slow basaglar titration PLUS his other medications of farxiga 5mg  daily and metformin 1000mg  bid. Only medicine he should have stopped was amaryl 4mg  daily.

## 2021-07-27 NOTE — Telephone Encounter (Signed)
Pt called stated his blood sugar has been running around 295 stated would like to get back on his previous medication  Please advise # [phone]

## 2021-07-28 ENCOUNTER — Telehealth: Payer: Self-pay | Admitting: *Deleted

## 2021-07-28 NOTE — Telephone Encounter (Addendum)
See prior phone note.  He should not have stopped metformin. Please restart. Continue farxiga and basaglar, continue titrating basaglar as discussed. Basaglar replaced amaryl only.

## 2021-07-28 NOTE — Telephone Encounter (Signed)
See other phone note. Patient notified as instructed by telephone and verbalized understanding.

## 2021-07-28 NOTE — Telephone Encounter (Signed)
PLEASE NOTE: All timestamps contained within this report are represented as [place] Standard Time. CONFIDENTIALTY NOTICE: This fax transmission is intended only for the addressee. It contains information that is legally privileged, confidential or otherwise protected from use or disclosure. If you are not the intended recipient, you are strictly prohibited from reviewing, disclosing, copying using or disseminating any of this information or taking any action in reliance on or regarding this information. If you have received this fax in error, please notify us immediately by telephone so that we can arrange for its return to [place]. Phone: [phone], Toll-Free: [phone], Fax: [phone] Page: 1 of 1 Call Id: [id number] [place] [facility] - Client Nonclinical Telephone Record  [facility] Client [place] [facility] - Client Client Site [employer] Provider Ria Bush - MD Contact Type Call Who Is Calling Patient / Member / Family / Caregiver Caller Name [place] [facility] Phone Number [phone] Patient Name Kirk Bennett Patient DOB 09/10/1950 Call Type Message Only Information Provided Reason for Call Request for General Office Information Initial Comment Caller states, needs to speak with office. Additional Comment Caller declined triage. Disp. Time Disposition Final User 07/28/2021 7:28:46 AM General Information Provided Yes Jobie Quaker Call Closed By: Jobie Quaker Transaction Date/Time: 07/28/2021 7:27:06 AM (ET)

## 2021-07-28 NOTE — Telephone Encounter (Signed)
Spoke to patient by telephone and was advised that his blood sugar has been running over 400 everyday since stopping Metformin. Patient stated that he is up to taking 12-14 units of insulin daily. Patient stated that he is taking [place] and has not missed any of his medications. Patient stated that he feels just fine. Patient stated this am his FBS was 420 and other days around 400. Patient wants to know what Dr. Danise Mina recommends that he do with his blood sugar being so high.

## 2021-07-28 NOTE — Telephone Encounter (Signed)
Patient notified as instructed by telephone and verbalized understanding. Advised patient to monitor his blood sugar and call back if it does not come down.

## 2021-08-02 ENCOUNTER — Ambulatory Visit: Payer: 59

## 2021-08-09 ENCOUNTER — Other Ambulatory Visit: Payer: Self-pay

## 2021-08-09 ENCOUNTER — Ambulatory Visit (INDEPENDENT_AMBULATORY_CARE_PROVIDER_SITE_OTHER): Payer: 59 | Admitting: Family Medicine

## 2021-08-09 DIAGNOSIS — N3941 Urge incontinence: Secondary | ICD-10-CM

## 2021-08-09 NOTE — Patient Instructions (Signed)
Tracking Your Bladder Symptoms    Patient Name:___________________________________________________   Sample: Day   Daytime Voids  Nighttime Voids Urgency for the Day(0-4) Number of Accidents Beverage Comments  Monday IIII II 2 I Water IIII Coffee  I      Week Starting:____________________________________   Day Daytime  Voids Nighttime  Voids Urgency for  The Day(0-4) Number of Accidents Beverages Comments                                                           This week my symptoms were:  O much better  O better O the same O worse   

## 2021-08-09 NOTE — Progress Notes (Addendum)
PTNS  Session # 2  Health & Social Factors: no change Caffeine: 0 Alcohol: 0 Daytime voids #per day: 4 Night-time voids #per night: 3 Urgency: mild Incontinence Episodes #per day: 0 Ankle used: left Treatment Setting: 1 Feeling/ Response: sensory Comments: Patient tolerated well  Performed By: Elberta Leatherwood, CMA  Follow Up: 1 week #3  Patient shifted his leg and the needle came out 8 minutes early. Because if his height he is wanting to be in another chair. Next week a regular room with a low bed can be used and add a chair to the end to see if that is more comfortable.

## 2021-08-17 ENCOUNTER — Other Ambulatory Visit: Payer: Self-pay

## 2021-08-17 ENCOUNTER — Ambulatory Visit (INDEPENDENT_AMBULATORY_CARE_PROVIDER_SITE_OTHER): Payer: 59

## 2021-08-17 DIAGNOSIS — N3941 Urge incontinence: Secondary | ICD-10-CM | POA: Diagnosis not present

## 2021-08-17 DIAGNOSIS — N3281 Overactive bladder: Secondary | ICD-10-CM

## 2021-08-17 NOTE — Progress Notes (Signed)
PTNS  Session # 3 of 45  Health & Social Factors: no changes Caffeine: 1 Alcohol: none Daytime voids #per day: 4 Night-time voids #per night: 3 Urgency: mild Incontinence Episodes #per day: 0 Ankle used: left Treatment Setting: 5 Feeling/ Response: sensory Comments: pt tolerated well  Performed By: Kerman Passey, RMA  Follow Up: 1 week

## 2021-08-24 ENCOUNTER — Encounter: Payer: Self-pay | Admitting: Family Medicine

## 2021-08-24 ENCOUNTER — Ambulatory Visit: Payer: 59 | Admitting: Family Medicine

## 2021-08-24 ENCOUNTER — Ambulatory Visit (INDEPENDENT_AMBULATORY_CARE_PROVIDER_SITE_OTHER): Payer: 59 | Admitting: Family Medicine

## 2021-08-24 ENCOUNTER — Other Ambulatory Visit: Payer: Self-pay

## 2021-08-24 VITALS — BP 118/60 | HR 55 | Temp 97.4°F | Ht 74.0 in | Wt 233.5 lb

## 2021-08-24 DIAGNOSIS — I10 Essential (primary) hypertension: Secondary | ICD-10-CM

## 2021-08-24 DIAGNOSIS — E1140 Type 2 diabetes mellitus with diabetic neuropathy, unspecified: Secondary | ICD-10-CM | POA: Diagnosis not present

## 2021-08-24 DIAGNOSIS — N1831 Chronic kidney disease, stage 3a: Secondary | ICD-10-CM | POA: Diagnosis not present

## 2021-08-24 DIAGNOSIS — E1121 Type 2 diabetes mellitus with diabetic nephropathy: Secondary | ICD-10-CM

## 2021-08-24 DIAGNOSIS — N3281 Overactive bladder: Secondary | ICD-10-CM

## 2021-08-24 DIAGNOSIS — N3941 Urge incontinence: Secondary | ICD-10-CM

## 2021-08-24 LAB — RENAL FUNCTION PANEL
Albumin: 4.2 g/dL (ref 3.5–5.2)
BUN: 20 mg/dL (ref 6–23)
CO2: 26 mEq/L (ref 19–32)
Calcium: 9.7 mg/dL (ref 8.4–10.5)
Chloride: 105 mEq/L (ref 96–112)
Creatinine, Ser: 1.53 mg/dL — ABNORMAL HIGH (ref 0.40–1.50)
GFR: 46.42 mL/min — ABNORMAL LOW (ref >60.00)
Glucose, Bld: 160 mg/dL — ABNORMAL HIGH (ref 70–99)
Phosphorus: 3.3 mg/dL (ref 2.3–4.6)
Potassium: 4.2 mEq/L (ref 3.5–5.1)
Sodium: 139 mEq/L (ref 135–145)

## 2021-08-24 NOTE — Assessment & Plan Note (Signed)
Chronic. There was some confusion over med changes from last visit - he stopped both metformin and amaryl at time of last visit when he started basaglar with subsequent marked elevation in sugars. Now back on original regimen of metformin, amaryl, farxiga and feels comfortable with this. Off basaglar. Will continue original regimen and update fructosamine and Cr.

## 2021-08-24 NOTE — Patient Instructions (Signed)
Tracking Your Bladder Symptoms    Patient Name:___________________________________________________   Sample: Day   Daytime Voids  Nighttime Voids Urgency for the Day(0-4) Number of Accidents Beverage Comments  Monday IIII II 2 I Water IIII Coffee  I      Week Starting:____________________________________   Day Daytime  Voids Nighttime  Voids Urgency for  The Day(0-4) Number of Accidents Beverages Comments                                                           This week my symptoms were:  O much better  O better O the same O worse   

## 2021-08-24 NOTE — Progress Notes (Signed)
PTNS  Session # 4 of 45  Health & Social Factors: no change Caffeine: 0 Alcohol: 0 Daytime voids #per day: 5 Night-time voids #per night: 0 Urgency: strong Incontinence Episodes #per day: 0 Ankle used: right Treatment Setting: 6 Feeling/ Response: sensory Comments: patient tolerated well  Performed By: Elberta Leatherwood, CMA  Follow Up: 1 week #5

## 2021-08-24 NOTE — Assessment & Plan Note (Signed)
Chronic, stable on lisinopril 10mg  bid.

## 2021-08-24 NOTE — Assessment & Plan Note (Signed)
Has started PTNS treatment.s

## 2021-08-24 NOTE — Progress Notes (Signed)
Patient ID: Kirk Bennett., male    DOB: 07-31-1951, 71 y.o.   MRN: [id number]  This visit was conducted in person.  BP 118/60 (BP Location: Left Arm, Cuff Size: Large)    Pulse (!) 55    Temp (!) 97.4 F (36.3 C) (Temporal)    Ht 6\' 2"  (1.88 m)    Wt 233 lb 8 oz (105.9 kg)    SpO2 96%    BMI 29.98 kg/m   BP Readings from Last 3 Encounters:  08/24/21 118/60  07/13/21 130/64  06/23/21 123/72    CC: 6wk DM f/u visit  Subjective:   HPI: Kirk Bennett. is a 71 y.o. male presenting on 08/24/2021 for Follow-up (6 week Dm ) and Foot Swelling (R x weeks)   Upcoming colonoscopy 09/02/2021.  Sees urology regularly for PTNS.   See prior note for details. Recent diagnosis parkinsonism through [facility], last seen 05/2021 and started on sinemet with planned f/u in 2-3 months. Had abnormal DatScan 04/2021.   Last visit A1c elevated to 11%. Last visit we chagned amaryl to basaglar 10u daily with planned slow titration to 30u daily. Confusion - he stopped both amaryl and metformin so sugars shot up to 400s despite basaglar.   DM - does regularly check sugars 188 fasting. Compliant with antihyperglycemic regimen which includes: farxiga 5mg  daily, metformin 1000mg  bid. Chronic paresthesias, no blurry vision. Last diabetic eye exam 04/2021. Glucometer brand: onetouch. Last foot exam: 12/2020. DSME: completed. Lab Results  Component Value Date   HGBA1C 11.0 (A) 07/13/2021   Diabetic Foot Exam - Simple   No data filed    Lab Results  Component Value Date   MICROALBUR <0.2 09/11/2015         Relevant past medical, surgical, family and social history reviewed and updated as indicated. Interim medical history since our last visit reviewed. Allergies and medications reviewed and updated. Outpatient Medications Prior to Visit  Medication Sig Dispense Refill   atorvastatin (LIPITOR) 20 MG tablet Take 1 tablet (20 mg total) by mouth daily. 90 tablet 3   carbidopa-levodopa  (SINEMET IR) 25-100 MG tablet PLEASE SEE ATTACHED FOR DETAILED DIRECTIONS     Cholecalciferol (VITAMIN D3) 1000 units CAPS Take 1,000 Units by mouth daily.     clotrimazole (LOTRIMIN AF) 1 % cream Apply 1 application topically 2 (two) times daily. 113 g 0   FARXIGA 5 MG TABS tablet TAKE 1 TABLET BY MOUTH EVERY DAY BEFORE BREAKFAST 30 tablet 6   glimepiride (AMARYL) 4 MG tablet Take 1 tablet (4 mg total) by mouth daily before breakfast.     glucose blood (ONE TOUCH ULTRA TEST) test strip Use to check sugar fasting in the AM and 2 hours after lunch or supper. Dx: E11.21 100 each 3   levETIRAcetam (KEPPRA) 1000 MG tablet Take 1,000 mg by mouth 2 (two) times daily.     lisinopril (ZESTRIL) 10 MG tablet TAKE 1 TABLET BY MOUTH TWICE A DAY 180 tablet 3   metFORMIN (GLUCOPHAGE) 1000 MG tablet TAKE 1 TABLET BY MOUTH TWICE A DAY WITH MEALS 180 tablet 3   Insulin Pen Needle (B-D ULTRAFINE III SHORT PEN) 31G X 8 MM MISC Use to inject insulin daily (Patient not taking: Reported on 08/24/2021) 100 each 3   Insulin Glargine (BASAGLAR KWIKPEN) 100 UNIT/ML Inject 10 Units into the skin daily. (Patient not taking: Reported on 08/24/2021) 3 mL 3   Facility-Administered Medications Prior to Visit  Medication Dose Route Frequency Provider Last Rate Last Admin   lidocaine (XYLOCAINE) 2 % jelly 1 application  1 application Urethral Once Billey Co, MD         Per HPI unless specifically indicated in ROS section below Review of Systems  Objective:  BP 118/60 (BP Location: Left Arm, Cuff Size: Large)    Pulse (!) 55    Temp (!) 97.4 F (36.3 C) (Temporal)    Ht 6\' 2"  (1.88 m)    Wt 233 lb 8 oz (105.9 kg)    SpO2 96%    BMI 29.98 kg/m   Wt Readings from Last 3 Encounters:  08/24/21 233 lb 8 oz (105.9 kg)  07/13/21 234 lb 5 oz (106.3 kg)  06/23/21 213 lb (96.6 kg)      Physical Exam Vitals and nursing note reviewed.  Constitutional:      Appearance: Normal appearance. He is not ill-appearing.  Eyes:      Extraocular Movements: Extraocular movements intact.     Conjunctiva/sclera: Conjunctivae normal.     Pupils: Pupils are equal, round, and reactive to light.  Cardiovascular:     Rate and Rhythm: Normal rate and regular rhythm.     Pulses: Normal pulses.     Heart sounds: Normal heart sounds. No murmur heard. Pulmonary:     Effort: Pulmonary effort is normal. No respiratory distress.     Breath sounds: Normal breath sounds. No wheezing, rhonchi or rales.  Musculoskeletal:        General: No tenderness or deformity.     Right lower leg: No edema.     Left lower leg: No edema.     Comments:  2+ DP on right No significant pedal edema appreciated See HPI for foot exam if done  Skin:    General: Skin is warm and dry.     Findings: No rash.  Neurological:     Mental Status: He is alert.     Comments: Ambulates with camptocormia  Psychiatric:        Mood and Affect: Mood normal.        Behavior: Behavior normal.      Results for orders placed or performed in visit on 07/13/21  POCT glycosylated hemoglobin (Hb A1C)  Result Value Ref Range   Hemoglobin A1C 11.0 (A) 4.0 - 5.6 %   HbA1c POC (<> result, manual entry)     HbA1c, POC (prediabetic range)     HbA1c, POC (controlled diabetic range)      Assessment & Plan:  This visit occurred during the SARS-CoV-2 public health emergency.  Safety protocols were in place, including screening questions prior to the visit, additional usage of staff PPE, and extensive cleaning of exam room while observing appropriate contact time as indicated for disinfecting solutions.   Problem List Items Addressed This Visit     Type 2 diabetes mellitus with diabetic nephropathy ([place]) - Primary    Chronic. There was some confusion over med changes from last visit - he stopped both metformin and amaryl at time of last visit when he started basaglar with subsequent marked elevation in sugars. Now back on original regimen of metformin, amaryl, farxiga and  feels comfortable with this. Off basaglar. Will continue original regimen and update fructosamine and Cr.       Relevant Medications   glimepiride (AMARYL) 4 MG tablet   Other Relevant Orders   Fructosamine   Renal function panel   Essential hypertension, benign  Chronic, stable on lisinopril 10mg  bid.       Stage 3 chronic kidney disease (HCC)    Update renal panel.       Urge urinary incontinence    Has started PTNS treatment.s       Type 2 diabetes mellitus with diabetic neuropathy, unspecified (HCC)   Relevant Medications   glimepiride (AMARYL) 4 MG tablet     No orders of the defined types were placed in this encounter.  Orders Placed This Encounter  Procedures   Fructosamine   Renal function panel     Patient Instructions  Labs today.  Continue metformin 1000mg  twice daily, amaryl 4mg  daily with breakfast, and farxiga 5mg  daily.  Return in March for physical, prior for fasting labs.   Follow up plan: Return if symptoms worsen or fail to improve.  Ria Bush, MD

## 2021-08-24 NOTE — Assessment & Plan Note (Signed)
Update renal panel 

## 2021-08-24 NOTE — Patient Instructions (Addendum)
Labs today.  Continue metformin 1000mg  twice daily, amaryl 4mg  daily with breakfast, and farxiga 5mg  daily.  Return in March for physical, prior for fasting labs.

## 2021-08-27 LAB — FRUCTOSAMINE: Fructosamine: 357 umol/L — ABNORMAL HIGH (ref 205–285)

## 2021-08-30 ENCOUNTER — Other Ambulatory Visit: Payer: Self-pay

## 2021-08-30 ENCOUNTER — Ambulatory Visit (INDEPENDENT_AMBULATORY_CARE_PROVIDER_SITE_OTHER): Payer: 59

## 2021-08-30 DIAGNOSIS — N3941 Urge incontinence: Secondary | ICD-10-CM

## 2021-08-30 DIAGNOSIS — N3281 Overactive bladder: Secondary | ICD-10-CM

## 2021-08-30 NOTE — Progress Notes (Signed)
PTNS  Session # 5  Health & Social Factors: No change Caffeine: 0 Alcohol: 0 Daytime voids #per day: 5 Night-time voids #per night: 2 Urgency: Strong Incontinence Episodes #per day: 2 (due to Parkinson's) Ankle used: Right Treatment Setting: 2 Feeling/ Response: Sensory Comments: Patient tolerated well.   Performed By: Bradly Bienenstock CMA  Follow Up: RTC in 1 week for session #6.

## 2021-08-30 NOTE — Patient Instructions (Signed)
Tracking Your Bladder Symptoms    Patient Name:___________________________________________________   Sample: Day   Daytime Voids  Nighttime Voids Urgency for the Day(0-4) Number of Accidents Beverage Comments  Monday IIII II 2 I Water IIII Coffee  I      Week Starting:____________________________________   Day Daytime  Voids Nighttime  Voids Urgency for  The Day(0-4) Number of Accidents Beverages Comments                                                           This week my symptoms were:  O much better  O better O the same O worse   

## 2021-09-01 ENCOUNTER — Encounter: Payer: Self-pay | Admitting: Gastroenterology

## 2021-09-01 NOTE — H&P (Signed)
Pre-Procedure H&P   Patient ID: Kirk Zoll. is a 71 y.o. Kirk.  Gastroenterology Provider: Annamaria Helling, DO  Referring Provider: Stephens November, NP PCP: Ria Bush, MD  Date: 09/02/2021  HPI Mr. Kirk Bennett. is a 71 y.o. Kirk who presents today for Colonoscopy for surveillance- phx polyps.  Pt with normal BM, no blood or melena, diarrhea or constipation. No other acute gi complaints. Hgb 15.1 mcv 87 plt 179 cr 1.5; a1c 7.3, gfr 46 On multiple AEDs w//o any seizure activity Last colonoscopy 11/2015 with 3 TA. Nodular TI noted on colonoscopy in 2007.  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 Mora Bellman at [facility])   Depression    found by neuro   Elevated PSA 05/27/2015   Serial monitoring ([place])    Glaucoma 2015   suspect   History of hepatitis A    HLD (hyperlipidemia)    HTN (hypertension)    Knee pain    s/p replacement   SVT (supraventricular tachycardia) ([place]) 1996   Vitamin D deficiency 03/02/2015   Well controlled type 2 diabetes mellitus with nephropathy ([place]) 1996   established with Dr. Gabriel Carina endo --> 02/2016 decided to return to PCP for DM care    Past Surgical History:  Procedure Laterality Date   CARDIAC CATHETERIZATION  02/19/2009   No blockages (Dr. 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 Garald Balding)    Family History Cousin with CRC at age 36. No other h/o GI disease or malignancy  Review of Systems  Constitutional:  Negative for activity change, appetite change, chills, diaphoresis, fatigue, fever and unexpected weight change.  HENT:  Negative for trouble swallowing and voice change.   Respiratory:  Negative for shortness of breath and wheezing.   Cardiovascular:  Negative for chest pain, palpitations and leg swelling.  Gastrointestinal:  Negative for abdominal distention, abdominal pain, anal bleeding, blood in stool, constipation, diarrhea, nausea and vomiting.  Musculoskeletal:  Negative for arthralgias and myalgias.  Skin:  Negative for color change and pallor.  Neurological:  Negative for dizziness, syncope and weakness.  Psychiatric/Behavioral:  Negative for confusion. The patient is not nervous/anxious.   All other systems reviewed and are negative.   Medications No current facility-administered medications on file prior to encounter.   Current Outpatient Medications on File Prior to Encounter  Medication Sig Dispense Refill   atorvastatin (LIPITOR) 20 MG tablet Take 1 tablet (20 mg total) by mouth daily. 90 tablet 3   carbidopa-levodopa (SINEMET IR) 25-100 MG tablet PLEASE SEE ATTACHED FOR DETAILED DIRECTIONS     Cholecalciferol (VITAMIN D3) 1000 units CAPS Take 1,000 Units by mouth daily.  clotrimazole (LOTRIMIN AF) 1 % cream Apply 1 application topically 2 (two) times daily. 113 g 0   Cyanocobalamin (VITAMIN B12 PO) Take by mouth.     FARXIGA 5 MG TABS tablet TAKE 1 TABLET BY MOUTH EVERY DAY BEFORE BREAKFAST 30 tablet 6   glimepiride (AMARYL) 4 MG tablet Take 1 tablet (4 mg total) by mouth daily before breakfast.     levETIRAcetam (KEPPRA) 1000 MG tablet Take 1,000 mg by mouth 2 (two) times daily.     lisinopril (ZESTRIL) 10 MG tablet TAKE 1 TABLET BY MOUTH  TWICE A DAY 180 tablet 3   metFORMIN (GLUCOPHAGE) 1000 MG tablet TAKE 1 TABLET BY MOUTH TWICE A DAY WITH MEALS 180 tablet 3   oxybutynin (DITROPAN) 5 MG tablet Take 5 mg by mouth daily.     tamsulosin (FLOMAX) 0.4 MG CAPS capsule Take 0.4 mg by mouth. Take 30 minutes after the same meal each day     glucose blood (ONE TOUCH ULTRA TEST) test strip Use to check sugar fasting in the AM and 2 hours after lunch or supper. Dx: E11.21 100 each 3   Insulin Pen Needle (B-D ULTRAFINE III SHORT PEN) 31G X 8 MM MISC Use to inject insulin daily (Patient not taking: Reported on 08/24/2021) 100 each 3    Pertinent medications related to GI and procedure were reviewed by me with the patient prior to the procedure   Current Facility-Administered Medications:    0.9 %  sodium chloride infusion, , Intravenous, Continuous, Annamaria Helling, DO, Last Rate: 20 mL/hr at 09/02/21 0901, New Bag at 09/02/21 0901  sodium chloride 20 mL/hr at 09/02/21 0901       Allergies  Allergen Reactions   Bee Venom Shortness Of Breath   Demerol [Meperidine Hcl] Nausea And Vomiting   Allergies were reviewed by me prior to the procedure  Objective    Vitals:   09/02/21 0848  BP: (!) 150/80  Pulse: 80  Resp: 18  Temp: (!) 96.9 F (36.1 C)  TempSrc: Temporal  SpO2: 98%  Weight: 99.8 kg  Height: 6\' 2"  (1.88 m)     Physical Exam Vitals and nursing note reviewed.  Constitutional:      General: He is not in acute distress.    Appearance: Normal appearance. He is not ill-appearing, toxic-appearing or diaphoretic.  HENT:     Head: Normocephalic and atraumatic.     Nose: Nose normal.     Mouth/Throat:     Mouth: Mucous membranes are moist.     Pharynx: Oropharynx is clear.  Eyes:     General: No scleral icterus.    Extraocular Movements: Extraocular movements intact.  Cardiovascular:     Rate and Rhythm: Normal rate and regular rhythm.     Heart sounds: Normal heart sounds. No murmur heard.   No friction  rub. No gallop.  Pulmonary:     Effort: Pulmonary effort is normal. No respiratory distress.     Breath sounds: Normal breath sounds. No wheezing, rhonchi or rales.  Abdominal:     General: Bowel sounds are normal. There is no distension.     Palpations: Abdomen is soft.     Tenderness: There is no abdominal tenderness. There is no guarding or rebound.  Musculoskeletal:     Cervical back: Neck supple.     Right lower leg: No edema.     Left lower leg: No edema.  Skin:    General: Skin is warm and dry.  Coloration: Skin is not jaundiced or pale.  Neurological:     General: No focal deficit present.     Mental Status: He is alert and oriented to person, place, and time. Mental status is at baseline.  Psychiatric:        Mood and Affect: Mood normal.        Behavior: Behavior normal.        Thought Content: Thought content normal.        Judgment: Judgment normal.     Assessment:  Mr. Aaban Griep. is a 71 y.o. Kirk  who presents today for Colonoscopy for surveillance- phx colon polyps.  Plan:  Colonoscopy with possible intervention today  Colonoscopy with possible biopsy, control of bleeding, polypectomy, and interventions as necessary has been discussed with the patient/patient representative. Informed consent was obtained from the patient/patient representative after explaining the indication, nature, and risks of the procedure including but not limited to death, bleeding, perforation, missed neoplasm/lesions, cardiorespiratory compromise, and reaction to medications. Opportunity for questions was given and appropriate answers were provided. Patient/patient representative has verbalized understanding is amenable to undergoing the procedure.   Annamaria Helling, DO  [facility]  Portions of the record may have been created with voice recognition software. Occasional wrong-word or 'sound-a-like' substitutions may have occurred due to the inherent  limitations of voice recognition software.  Read the chart carefully and recognize, using context, where substitutions may have occurred.

## 2021-09-02 ENCOUNTER — Other Ambulatory Visit: Payer: Self-pay

## 2021-09-02 ENCOUNTER — Encounter
Admission: RE | Disposition: A | Discharge status: Home / Self Care | Payer: Self-pay | Source: Home / Self Care | Attending: Gastroenterology

## 2021-09-02 ENCOUNTER — Ambulatory Visit
Admission: RE | Admit: 2021-09-02 | Discharge: 2021-09-02 | Disposition: A | Discharge status: Home / Self Care | Payer: 59 | Attending: Gastroenterology | Admitting: Gastroenterology

## 2021-09-02 ENCOUNTER — Ambulatory Visit: Payer: 59 | Admitting: Registered Nurse

## 2021-09-02 ENCOUNTER — Encounter: Payer: Self-pay | Admitting: Gastroenterology

## 2021-09-02 DIAGNOSIS — I129 Hypertensive chronic kidney disease with stage 1 through stage 4 chronic kidney disease, or unspecified chronic kidney disease: Secondary | ICD-10-CM | POA: Insufficient documentation

## 2021-09-02 DIAGNOSIS — Z1211 Encounter for screening for malignant neoplasm of colon: Secondary | ICD-10-CM | POA: Diagnosis not present

## 2021-09-02 DIAGNOSIS — Z955 Presence of coronary angioplasty implant and graft: Secondary | ICD-10-CM | POA: Insufficient documentation

## 2021-09-02 DIAGNOSIS — Z7984 Long term (current) use of oral hypoglycemic drugs: Secondary | ICD-10-CM | POA: Insufficient documentation

## 2021-09-02 DIAGNOSIS — D124 Benign neoplasm of descending colon: Secondary | ICD-10-CM | POA: Diagnosis not present

## 2021-09-02 DIAGNOSIS — Z79899 Other long term (current) drug therapy: Secondary | ICD-10-CM | POA: Insufficient documentation

## 2021-09-02 DIAGNOSIS — Z86010 Personal history of colonic polyps: Secondary | ICD-10-CM | POA: Diagnosis not present

## 2021-09-02 DIAGNOSIS — K640 First degree hemorrhoids: Secondary | ICD-10-CM | POA: Diagnosis not present

## 2021-09-02 DIAGNOSIS — D120 Benign neoplasm of cecum: Secondary | ICD-10-CM | POA: Insufficient documentation

## 2021-09-02 DIAGNOSIS — E1122 Type 2 diabetes mellitus with diabetic chronic kidney disease: Secondary | ICD-10-CM | POA: Insufficient documentation

## 2021-09-02 DIAGNOSIS — D127 Benign neoplasm of rectosigmoid junction: Secondary | ICD-10-CM | POA: Diagnosis not present

## 2021-09-02 DIAGNOSIS — N183 Chronic kidney disease, stage 3 unspecified: Secondary | ICD-10-CM | POA: Insufficient documentation

## 2021-09-02 DIAGNOSIS — E785 Hyperlipidemia, unspecified: Secondary | ICD-10-CM | POA: Diagnosis not present

## 2021-09-02 DIAGNOSIS — D125 Benign neoplasm of sigmoid colon: Secondary | ICD-10-CM | POA: Diagnosis not present

## 2021-09-02 DIAGNOSIS — Z794 Long term (current) use of insulin: Secondary | ICD-10-CM | POA: Diagnosis not present

## 2021-09-02 HISTORY — PX: COLONOSCOPY WITH PROPOFOL: SHX5780

## 2021-09-02 LAB — GLUCOSE, CAPILLARY: Glucose-Capillary: 203 mg/dL — ABNORMAL HIGH (ref 70–99)

## 2021-09-02 SURGERY — COLONOSCOPY WITH PROPOFOL
Anesthesia: General

## 2021-09-02 MED ORDER — SODIUM CHLORIDE 0.9 % IV SOLN: INTRAVENOUS | Status: DC

## 2021-09-02 MED ORDER — PROPOFOL 500 MG/50ML IV EMUL
INTRAVENOUS | Status: DC | PRN
  Administered 2021-09-02: 125 ug/kg/min via INTRAVENOUS

## 2021-09-02 MED ORDER — LIDOCAINE HCL (CARDIAC) PF 100 MG/5ML IV SOSY
PREFILLED_SYRINGE | INTRAVENOUS | Status: DC | PRN
  Administered 2021-09-02: 50 mg via INTRAVENOUS

## 2021-09-02 MED ORDER — PROPOFOL 10 MG/ML IV BOLUS
INTRAVENOUS | Status: DC | PRN
  Administered 2021-09-02: 80 mg via INTRAVENOUS

## 2021-09-02 MED ORDER — PROPOFOL 500 MG/50ML IV EMUL
INTRAVENOUS | Status: AC
  Filled 2021-09-02: qty 50

## 2021-09-02 NOTE — Interval H&P Note (Signed)
History and Physical Interval Note: Preprocedure H&P from 09/02/21  was reviewed and there was no interval change after seeing and examining the patient.  Written consent was obtained from the patient after discussion of risks, benefits, and alternatives. Patient has consented to proceed with Colonoscopy with possible intervention   09/02/2021 9:21 AM  Kirk Bennett.  has presented today for surgery, with the diagnosis of HX OF POLYPS.  The various methods of treatment have been discussed with the patient and family. After consideration of risks, benefits and other options for treatment, the patient has consented to  Procedure(s) with comments: COLONOSCOPY WITH PROPOFOL (N/A) - DM as a surgical intervention.  The patient's history has been reviewed, patient examined, no change in status, stable for surgery.  I have reviewed the patient's chart and labs.  Questions were answered to the patient's satisfaction.     Annamaria Helling

## 2021-09-02 NOTE — Anesthesia Procedure Notes (Signed)
Date/Time: 09/02/2021 9:30 AM Performed by: Doreen Salvage, CRNA Pre-anesthesia Checklist: Patient identified, Emergency Drugs available, Patient being monitored and Suction available Patient Re-evaluated:Patient Re-evaluated prior to induction Oxygen Delivery Method: Nasal cannula Induction Type: IV induction Comments: Nasal cannula with etCO2 monitoring

## 2021-09-02 NOTE — Anesthesia Preprocedure Evaluation (Signed)
Anesthesia Evaluation  Patient identified by MRN, date of birth, ID band Patient awake    Reviewed: Allergy & Precautions, NPO status , Patient's Chart, lab work & pertinent test results  History of Anesthesia Complications Negative for: history of anesthetic complications  Airway Mallampati: II  TM Distance: >3 FB Neck ROM: Full    Dental  (+) Dental Advidsory Given, Poor Dentition, Missing   Pulmonary neg shortness of breath, sleep apnea , neg COPD, neg recent URI,    Pulmonary exam normal        Cardiovascular hypertension, Pt. on medications (-) angina(-) Past MI and (-) Cardiac Stents Normal cardiovascular exam+ dysrhythmias Supra Ventricular Tachycardia (-) Valvular Problems/Murmurs     Neuro/Psych Seizures -, Well Controlled,  PSYCHIATRIC DISORDERS Depression    GI/Hepatic negative GI ROS, Neg liver ROS,   Endo/Other  diabetes, Well Controlled, Type 2, Oral Hypoglycemic Agents  Renal/GU Renal InsufficiencyRenal disease  negative genitourinary   Musculoskeletal  (+) Arthritis , Osteoarthritis,    Abdominal Normal abdominal exam  (+)   Peds negative pediatric ROS (+)  Hematology negative hematology ROS (+)   Anesthesia Other Findings Past Medical History: 02/2011: Cataracts, bilateral     Comment:  and suspected glaucoma, to return for f/u, no diabetic               retinopathy 2015: CKD stage 3 due to type 2 diabetes mellitus (HCC)     Comment:  normal renal US, self referred to Dr Holley Raring 1990: Complex partial seizures ([place])     Comment:  from surgery for R temporal arachnoid cyst s/p drainage,              possible continued sz so changed to lamotrigine (Dr.               Mora Bellman at [facility]) No date: Depression     Comment:  found by neuro 05/27/2015: Elevated PSA     Comment:  Serial monitoring ([place])  2015: Glaucoma     Comment:  suspect No date: History of hepatitis A No date: HLD  (hyperlipidemia) No date: HTN (hypertension) No date: Knee pain     Comment:  s/p replacement 1996: SVT (supraventricular tachycardia) ([place]) 03/02/2015: Vitamin D deficiency 1996: Well controlled type 2 diabetes mellitus with nephropathy ([place])     Comment:  established with Dr. Gabriel Carina endo --> 02/2016 decided to               return to PCP for DM care   Reproductive/Obstetrics                             Anesthesia Physical  Anesthesia Plan  ASA: III  Anesthesia Plan: General   Post-op Pain Management:    Induction: Intravenous  PONV Risk Score and Plan: 1 and Propofol infusion and TIVA  Airway Management Planned: Nasal Cannula and Natural Airway  Additional Equipment:   Intra-op Plan:   Post-operative Plan:   Informed Consent: I have reviewed the patients History and Physical, chart, labs and discussed the procedure including the risks, benefits and alternatives for the proposed anesthesia with the patient or authorized representative who has indicated his/her understanding and acceptance.     Dental advisory given  Plan Discussed with: CRNA and Surgeon  Anesthesia Plan Comments:         Anesthesia Quick Evaluation

## 2021-09-02 NOTE — Op Note (Signed)
[facility] Patient Name: Kirk Bennett Procedure Date: 09/02/2021 9:20 AM MRN: [id number] Account #: [id number] Date of Birth: [birth year] Admit Type: Outpatient Age: 71 Room: [facility] ENDO ROOM 1 Gender: Male Note Status: Finalized Instrument Name: Colonoscope [id number] Procedure:             Colonoscopy Indications:           High risk colon cancer surveillance: Personal history                         of colonic polyps Providers:             Rueben Bash, DO Referring MD:          Ria Bush (Referring MD) Medicines:             Monitored Anesthesia Care Complications:         No immediate complications. Estimated blood loss:                         Minimal. Procedure:             Pre-Anesthesia Assessment:                        - Prior to the procedure, a History and Physical was                         performed, and patient medications and allergies were                         reviewed. The patient is competent. The risks and                         benefits of the procedure and the sedation options and                         risks were discussed with the patient. All questions                         were answered and informed consent was obtained.                         Patient identification and proposed procedure were                         verified by the physician, the nurse, the anesthetist                         and the technician in the endoscopy suite. Mental                         Status Examination: alert and oriented. Airway                         Examination: normal oropharyngeal airway and neck                         mobility. Respiratory Examination: clear to  auscultation. CV Examination: RRR, no murmurs, no S3                         or S4. Prophylactic Antibiotics: The patient does not                         require prophylactic antibiotics. Prior                          Anticoagulants: The patient has taken no previous                         anticoagulant or antiplatelet agents. ASA Grade                         Assessment: III - A patient with severe systemic                         disease. After reviewing the risks and benefits, the                         patient was deemed in satisfactory condition to                         undergo the procedure. The anesthesia plan was to use                         monitored anesthesia care (MAC). Immediately prior to                         administration of medications, the patient was                         re-assessed for adequacy to receive sedatives. The                         heart rate, respiratory rate, oxygen saturations,                         blood pressure, adequacy of pulmonary ventilation, and                         response to care were monitored throughout the                         procedure. The physical status of the patient was                         re-assessed after the procedure.                        After obtaining informed consent, the colonoscope was                         passed under direct vision. Throughout the procedure,                         the patient's blood pressure, pulse, and oxygen  saturations were monitored continuously. The                         Colonoscope was introduced through the anus and                         advanced to the the terminal ileum, with                         identification of the appendiceal orifice and IC                         valve. The colonoscopy was performed without                         difficulty. The patient tolerated the procedure well.                         The quality of the bowel preparation was evaluated                         using the BBPS [facility] Bowel Preparation Scale) with                         scores of: Right Colon = 2 (minor amount of residual                         staining, small  fragments of stool and/or opaque                         liquid, but mucosa seen well), Transverse Colon = 2                         (minor amount of residual staining, small fragments of                         stool and/or opaque liquid, but mucosa seen well) and                         Left Colon = 3 (entire mucosa seen well with no                         residual staining, small fragments of stool or opaque                         liquid). The total BBPS score equals 7. The quality of                         the bowel preparation was good. The terminal ileum,                         ileocecal valve, appendiceal orifice, and rectum were                         photographed. Findings:      The perianal and digital rectal examinations were normal. Pertinent       negatives include normal sphincter  tone.      Many polyps were found in the distal ileum. The polyps were 1 to 2 mm in       size. No biopsies taken as these have been previously documented and       biopsied as benign Estimated blood loss: none.      Six sessile polyps were found in the sigmoid colon (2), descending colon       (3) and cecum (1). The polyps were 1 to 2 mm in size. These polyps were       removed with a cold biopsy forceps. Resection and retrieval were       complete. Estimated blood loss was minimal.      A 4 to 5 mm polyp was found in the rectum. The polyp was sessile. The       polyp was removed with a cold snare. Resection and retrieval were       complete. Estimated blood loss was minimal.      Non-bleeding internal hemorrhoids were found during retroflexion. The       hemorrhoids were Grade I (internal hemorrhoids that do not prolapse).       Estimated blood loss: none.      The exam was otherwise without abnormality on direct and retroflexion       views. 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                        - Six 1 to 2 mm polyps in the sigmoid colon, in the                          descending colon and in the cecum, removed with a cold                         biopsy forceps. Resected and retrieved.                        - One 4 to 5 mm polyp in the rectum, removed with a                         cold snare. Resected and retrieved.                        - Non-bleeding internal hemorrhoids.                        - The examination was otherwise normal on direct and                         retroflexion views. Recommendation:        - Discharge patient to home.                        - Resume previous diet.                        - Continue present medications.                        - No aspirin, ibuprofen, naproxen, or other  non-steroidal anti-inflammatory drugs for 5 days after                         polyp removal.                        - Await pathology results.                        - Repeat colonoscopy for surveillance based on                         pathology results.                        - Return to referring physician as previously                         scheduled. Procedure Code(s):     --- Professional ---                        [phone], Colonoscopy, flexible; with removal of                         tumor(s), polyp(s), or other lesion(s) by snare                         technique                        [id number], 63, Colonoscopy, flexible; with biopsy, single                         or multiple Diagnosis Code(s):     --- Professional ---                        Z86.010, Personal history of colonic polyps                        D13.39, Benign neoplasm of other parts of small                         intestine                        K63.5, Polyp of colon                        K62.1, Rectal polyp                        K64.0, First degree hemorrhoids CPT copyright 2019 [employer]. All rights reserved. The codes documented in this report are preliminary and upon coder review may  be revised to meet current  compliance requirements. Attending Participation:      I personally performed the entire procedure. Volney American, DO Annamaria Helling DO, DO 09/02/2021 10:05:56 AM This report has been signed electronically. Number of Addenda: 0 Note Initiated On: 09/02/2021 9:20 AM Scope Withdrawal Time: 0 hours 21 minutes 41 seconds  Total Procedure Duration: 0 hours 29 minutes 47 seconds  Estimated Blood Loss:  Estimated blood loss was minimal.  [facility]

## 2021-09-02 NOTE — Transfer of Care (Signed)
Immediate Anesthesia Transfer of Care Note  Patient: Kirk Bennett.  Procedure(s) Performed: COLONOSCOPY WITH PROPOFOL  Patient Location: PACU and Endoscopy Unit  Anesthesia Type:General  Level of Consciousness: drowsy  Airway & Oxygen Therapy: Patient Spontanous Breathing  Post-op Assessment: Report given to RN and Post -op Vital signs reviewed and stable  Post vital signs: Reviewed and stable  Last Vitals:  Vitals Value Taken Time  BP 112/59 09/02/21 1003  Temp 36.1 C 09/02/21 1003  Pulse 87 09/02/21 1005  Resp 19 09/02/21 1005  SpO2 97 % 09/02/21 1005  Vitals shown include unvalidated device data.  Last Pain:  Vitals:   09/02/21 1000  TempSrc: Temporal  PainSc:          Complications: No notable events documented.

## 2021-09-02 NOTE — OR Nursing (Signed)
Pt seems a little agitated,He told me that HE WAS READY TO GO 15 MINUTES AGO. ASSISTED TO DRESS WITH lEIGH AND ESCORTED TO CAR IN W/C.

## 2021-09-03 ENCOUNTER — Encounter: Payer: Self-pay | Admitting: Gastroenterology

## 2021-09-03 LAB — SURGICAL PATHOLOGY

## 2021-09-04 ENCOUNTER — Encounter: Payer: Self-pay | Admitting: Family Medicine

## 2021-09-04 NOTE — Anesthesia Postprocedure Evaluation (Addendum)
Anesthesia Post Note  Patient: Kirk Bennett.  Procedure(s) Performed: COLONOSCOPY WITH PROPOFOL  Patient location during evaluation: Endoscopy Anesthesia Type: General Level of consciousness: awake and alert Pain management: pain level controlled Vital Signs Assessment: post-procedure vital signs reviewed and stable Respiratory status: spontaneous breathing, nonlabored ventilation, respiratory function stable and patient connected to nasal cannula oxygen Cardiovascular status: blood pressure returned to baseline and stable Postop Assessment: no apparent nausea or vomiting Anesthetic complications: no   No notable events documented.   Last Vitals:  Vitals:   09/02/21 1020 09/02/21 1030  BP: 124/75 133/62  Pulse: 75 76  Resp: 18 13  Temp:    SpO2: 100% 95%    Last Pain:  Vitals:   09/02/21 1000  TempSrc: Temporal  PainSc:                  [name]

## 2021-09-04 NOTE — Addendum Note (Signed)
Addendum  created 09/04/21 1708 by [name], MD   Clinical Note Signed

## 2021-09-06 ENCOUNTER — Other Ambulatory Visit: Payer: Self-pay

## 2021-09-06 ENCOUNTER — Ambulatory Visit (INDEPENDENT_AMBULATORY_CARE_PROVIDER_SITE_OTHER): Payer: 59 | Admitting: Family Medicine

## 2021-09-06 DIAGNOSIS — N3941 Urge incontinence: Secondary | ICD-10-CM | POA: Diagnosis not present

## 2021-09-06 NOTE — Patient Instructions (Signed)
Tracking Your Bladder Symptoms    Patient Name:___________________________________________________   Sample: Day   Daytime Voids  Nighttime Voids Urgency for the Day(0-4) Number of Accidents Beverage Comments  Monday IIII II 2 I Water IIII Coffee  I      Week Starting:____________________________________   Day Daytime  Voids Nighttime  Voids Urgency for  The Day(0-4) Number of Accidents Beverages Comments                                                           This week my symptoms were:  O much better  O better O the same O worse   

## 2021-09-06 NOTE — Progress Notes (Signed)
PTNS  Session # 6 of 45  Health & Social Factors: no change Caffeine: 0 Alcohol: 0 Daytime voids #per day: 5 Night-time voids #per night: 2 Urgency: strong Incontinence Episodes #per day: 2 Ankle used: right Treatment Setting: 6 Feeling/ Response: sensory Comments: Patient tolerated well  Performed By: Elberta Leatherwood, CMA  Follow Up: 1 week #7

## 2021-09-13 ENCOUNTER — Other Ambulatory Visit: Payer: Self-pay

## 2021-09-13 ENCOUNTER — Ambulatory Visit (INDEPENDENT_AMBULATORY_CARE_PROVIDER_SITE_OTHER): Payer: 59 | Admitting: Family Medicine

## 2021-09-13 DIAGNOSIS — N3941 Urge incontinence: Secondary | ICD-10-CM | POA: Diagnosis not present

## 2021-09-13 NOTE — Patient Instructions (Signed)
Tracking Your Bladder Symptoms    Patient Name:___________________________________________________   Sample: Day   Daytime Voids  Nighttime Voids Urgency for the Day(0-4) Number of Accidents Beverage Comments  Monday IIII II 2 I Water IIII Coffee  I      Week Starting:____________________________________   Day Daytime  Voids Nighttime  Voids Urgency for  The Day(0-4) Number of Accidents Beverages Comments                                                           This week my symptoms were:  O much better  O better O the same O worse   

## 2021-09-13 NOTE — Progress Notes (Signed)
PTNS  Session # 7 of 45  Health & Social Factors: no change Caffeine: 0 Alcohol: 0 Daytime voids #per day: 5 Night-time voids #per night: 2 Urgency: strong Incontinence Episodes #per day: 2 Ankle used: left Treatment Setting: 1 Feeling/ Response: sensory Comments: Patient tolerated well  Performed By: Elberta Leatherwood, CMA  Follow Up: 1 week #8

## 2021-09-20 ENCOUNTER — Other Ambulatory Visit: Payer: Self-pay

## 2021-09-20 ENCOUNTER — Ambulatory Visit: Payer: 59

## 2021-09-20 DIAGNOSIS — N3281 Overactive bladder: Secondary | ICD-10-CM

## 2021-09-20 NOTE — Progress Notes (Signed)
PTNS  Session # 8  Health & Social Factors: No Change Caffeine: 0 Alcohol: 0 Daytime voids #per day: 5 Night-time voids #per night: 2 Urgency: Strong Incontinence Episodes #per day: 3 Ankle used: Right Treatment Setting: 10 Feeling/ Response: Toe Flex & Sensory  Comments: N/A  Performed By: Gordy Clement, CMA   Follow Up: RTC in 1 week

## 2021-09-27 ENCOUNTER — Other Ambulatory Visit: Payer: Self-pay

## 2021-09-27 ENCOUNTER — Ambulatory Visit (INDEPENDENT_AMBULATORY_CARE_PROVIDER_SITE_OTHER): Payer: 59

## 2021-09-27 DIAGNOSIS — N3941 Urge incontinence: Secondary | ICD-10-CM

## 2021-09-27 DIAGNOSIS — N3281 Overactive bladder: Secondary | ICD-10-CM | POA: Diagnosis not present

## 2021-09-27 NOTE — Progress Notes (Signed)
PTNS  Session # 9  Health & Social Factors: No change Caffeine: 0 Alcohol: 0 Daytime voids #per day: 5 Night-time voids #per night: 2 Urgency: Strong Incontinence Episodes #per day: 2-3 Ankle used: Right Treatment Setting: 1 Feeling/ Response: Sensory Comments: Had to stick pt a second time, first one fell out.  Performed By: Bradly Bienenstock CMA  Follow Up: RTC 1 week.

## 2021-09-27 NOTE — Patient Instructions (Signed)
Tracking Your Bladder Symptoms    Patient Name:___________________________________________________   Sample: Day   Daytime Voids  Nighttime Voids Urgency for the Day(0-4) Number of Accidents Beverage Comments  Monday IIII II 2 I Water IIII Coffee  I      Week Starting:____________________________________   Day Daytime  Voids Nighttime  Voids Urgency for  The Day(0-4) Number of Accidents Beverages Comments                                                           This week my symptoms were:  O much better  O better O the same O worse   

## 2021-10-04 ENCOUNTER — Ambulatory Visit (INDEPENDENT_AMBULATORY_CARE_PROVIDER_SITE_OTHER): Payer: 59

## 2021-10-04 ENCOUNTER — Other Ambulatory Visit: Payer: Self-pay

## 2021-10-04 DIAGNOSIS — N3941 Urge incontinence: Secondary | ICD-10-CM | POA: Diagnosis not present

## 2021-10-04 DIAGNOSIS — N3281 Overactive bladder: Secondary | ICD-10-CM

## 2021-10-04 NOTE — Progress Notes (Signed)
PTNS  Session # 10  Health & Social Factors: No Change Caffeine: 0 Alcohol: 0 Daytime voids #per day: 5 Night-time voids #per night: 2 Urgency: Strong Incontinence Episodes #per day: 3 Ankle used: Left Treatment Setting: 1 Feeling/ Response: Sensory  Comments: Pt and wife note no change in symptoms.   Performed By: Gordy Clement, CMA   Follow Up: RTC in 1 week

## 2021-10-08 ENCOUNTER — Other Ambulatory Visit: Payer: Self-pay | Admitting: Family Medicine

## 2021-10-11 ENCOUNTER — Ambulatory Visit (INDEPENDENT_AMBULATORY_CARE_PROVIDER_SITE_OTHER): Payer: 59

## 2021-10-11 ENCOUNTER — Other Ambulatory Visit: Payer: Self-pay

## 2021-10-11 DIAGNOSIS — N3281 Overactive bladder: Secondary | ICD-10-CM

## 2021-10-11 DIAGNOSIS — N3941 Urge incontinence: Secondary | ICD-10-CM | POA: Diagnosis not present

## 2021-10-11 NOTE — Progress Notes (Signed)
PTNS  Session # 11  Health & Social Factors: No change Caffeine: 0 Alcohol: 0 Daytime voids #per day: 5 Night-time voids #per night: 2 Urgency: Strong Incontinence Episodes #per day: 3 Ankle used: Right Treatment Setting: 3 Feeling/ Response: Sensory Comments: Pt tolerated well, no complications were noted.   Performed By: Bradly Bienenstock CMA  Follow Up: RTC in 1 week for PTNS #12.

## 2021-10-11 NOTE — Patient Instructions (Signed)
Tracking Your Bladder Symptoms    Patient Name:___________________________________________________   Sample: Day   Daytime Voids  Nighttime Voids Urgency for the Day(0-4) Number of Accidents Beverage Comments  Monday IIII II 2 I Water IIII Coffee  I      Week Starting:____________________________________   Day Daytime  Voids Nighttime  Voids Urgency for  The Day(0-4) Number of Accidents Beverages Comments                                                           This week my symptoms were:  O much better  O better O the same O worse   

## 2021-10-13 ENCOUNTER — Other Ambulatory Visit: Payer: Self-pay | Admitting: Family Medicine

## 2021-10-18 ENCOUNTER — Ambulatory Visit (INDEPENDENT_AMBULATORY_CARE_PROVIDER_SITE_OTHER): Payer: 59

## 2021-10-18 ENCOUNTER — Other Ambulatory Visit: Payer: Self-pay | Admitting: Family Medicine

## 2021-10-18 ENCOUNTER — Other Ambulatory Visit: Payer: Self-pay

## 2021-10-18 DIAGNOSIS — E1169 Type 2 diabetes mellitus with other specified complication: Secondary | ICD-10-CM

## 2021-10-18 DIAGNOSIS — N3941 Urge incontinence: Secondary | ICD-10-CM | POA: Diagnosis not present

## 2021-10-18 DIAGNOSIS — E559 Vitamin D deficiency, unspecified: Secondary | ICD-10-CM

## 2021-10-18 DIAGNOSIS — N3281 Overactive bladder: Secondary | ICD-10-CM

## 2021-10-18 DIAGNOSIS — E1121 Type 2 diabetes mellitus with diabetic nephropathy: Secondary | ICD-10-CM

## 2021-10-18 DIAGNOSIS — E538 Deficiency of other specified B group vitamins: Secondary | ICD-10-CM

## 2021-10-18 DIAGNOSIS — R972 Elevated prostate specific antigen [PSA]: Secondary | ICD-10-CM

## 2021-10-18 DIAGNOSIS — N1831 Chronic kidney disease, stage 3a: Secondary | ICD-10-CM

## 2021-10-18 NOTE — Progress Notes (Signed)
PTNS  Session # 12  Health & Social Factors: No change Caffeine: 0 Alcohol: 0 Daytime voids #per day: 5 Night-time voids #per night: 2 Urgency: Strong Incontinence Episodes #per day: 3 Ankle used: Right Treatment Setting: 8 Feeling/ Response: Sensory Comments: 1st attempt at sticking pt, pt stated it "hurt." Asked pt if he would like for someone else to stick him. Pt replied yes. Elberta Leatherwood CMA was successful at stick #2.  Performed By: Bradly Bienenstock CMA & Elberta Leatherwood CMA  Follow Up: RTC for post PTNS appt. With [employer]

## 2021-10-19 ENCOUNTER — Other Ambulatory Visit (INDEPENDENT_AMBULATORY_CARE_PROVIDER_SITE_OTHER): Payer: 59

## 2021-10-19 DIAGNOSIS — E559 Vitamin D deficiency, unspecified: Secondary | ICD-10-CM | POA: Diagnosis not present

## 2021-10-19 DIAGNOSIS — E538 Deficiency of other specified B group vitamins: Secondary | ICD-10-CM

## 2021-10-19 DIAGNOSIS — R7989 Other specified abnormal findings of blood chemistry: Secondary | ICD-10-CM

## 2021-10-19 DIAGNOSIS — E1169 Type 2 diabetes mellitus with other specified complication: Secondary | ICD-10-CM

## 2021-10-19 DIAGNOSIS — N1831 Chronic kidney disease, stage 3a: Secondary | ICD-10-CM | POA: Diagnosis not present

## 2021-10-19 DIAGNOSIS — E1121 Type 2 diabetes mellitus with diabetic nephropathy: Secondary | ICD-10-CM | POA: Diagnosis not present

## 2021-10-19 DIAGNOSIS — R972 Elevated prostate specific antigen [PSA]: Secondary | ICD-10-CM

## 2021-10-19 DIAGNOSIS — E785 Hyperlipidemia, unspecified: Secondary | ICD-10-CM | POA: Diagnosis not present

## 2021-10-19 LAB — CBC WITH DIFFERENTIAL/PLATELET
Basophils Absolute: 0 10*3/uL (ref 0.0–0.1)
Basophils Relative: 0.6 % (ref 0.0–3.0)
Eosinophils Absolute: 0.3 10*3/uL (ref 0.0–0.7)
Eosinophils Relative: 5 % (ref 0.0–5.0)
HCT: 44.9 % (ref 39.0–52.0)
Hemoglobin: 14.6 g/dL (ref 13.0–17.0)
Lymphocytes Relative: 34.8 % (ref 12.0–46.0)
Lymphs Abs: 2.2 10*3/uL (ref 0.7–4.0)
MCHC: 32.6 g/dL (ref 30.0–36.0)
MCV: 85.4 fl (ref 78.0–100.0)
Monocytes Absolute: 0.5 10*3/uL (ref 0.1–1.0)
Monocytes Relative: 7.2 % (ref 3.0–12.0)
Neutro Abs: 3.3 10*3/uL (ref 1.4–7.7)
Neutrophils Relative %: 52.4 % (ref 43.0–77.0)
Platelets: 184 10*3/uL (ref 150.0–400.0)
RBC: 5.25 Mil/uL (ref 4.22–5.81)
RDW: 12.4 % (ref 11.5–15.5)
WBC: 6.4 10*3/uL (ref 4.0–10.5)

## 2021-10-19 LAB — LIPID PANEL
Cholesterol: 140 mg/dL (ref 0–200)
HDL: 39.3 mg/dL (ref >39.00)
LDL Cholesterol: 87 mg/dL (ref 0–99)
NonHDL: 100.77
Total CHOL/HDL Ratio: 4
Triglycerides: 67 mg/dL (ref 0.0–149.0)
VLDL: 13.4 mg/dL (ref 0.0–40.0)

## 2021-10-19 LAB — COMPREHENSIVE METABOLIC PANEL
ALT: 2 U/L (ref 0–53)
AST: 8 U/L (ref 0–37)
Albumin: 4.4 g/dL (ref 3.5–5.2)
Alkaline Phosphatase: 111 U/L (ref 39–117)
BUN: 21 mg/dL (ref 6–23)
CO2: 26 mEq/L (ref 19–32)
Calcium: 9.7 mg/dL (ref 8.4–10.5)
Chloride: 105 mEq/L (ref 96–112)
Creatinine, Ser: 1.65 mg/dL — ABNORMAL HIGH (ref 0.40–1.50)
GFR: 42.35 mL/min — ABNORMAL LOW (ref >60.00)
Glucose, Bld: 207 mg/dL — ABNORMAL HIGH (ref 70–99)
Potassium: 4 mEq/L (ref 3.5–5.1)
Sodium: 140 mEq/L (ref 135–145)
Total Bilirubin: 0.9 mg/dL (ref 0.2–1.2)
Total Protein: 7 g/dL (ref 6.0–8.3)

## 2021-10-19 LAB — VITAMIN B12: Vitamin B-12: 296 pg/mL (ref 211–911)

## 2021-10-19 LAB — PSA: PSA: 0.71 ng/mL (ref 0.10–4.00)

## 2021-10-19 LAB — VITAMIN D 25 HYDROXY (VIT D DEFICIENCY, FRACTURES): VITD: 38.32 ng/mL (ref 30.00–100.00)

## 2021-10-19 LAB — HEMOGLOBIN A1C: Hgb A1c MFr Bld: 7.4 % — ABNORMAL HIGH (ref 4.6–6.5)

## 2021-10-19 NOTE — Addendum Note (Signed)
Addended by: Ellamae Sia on: 10/19/2021 10:59 AM   Modules accepted: Orders

## 2021-10-26 ENCOUNTER — Other Ambulatory Visit: Payer: Self-pay

## 2021-10-26 ENCOUNTER — Ambulatory Visit (INDEPENDENT_AMBULATORY_CARE_PROVIDER_SITE_OTHER): Payer: 59 | Admitting: Family Medicine

## 2021-10-26 ENCOUNTER — Encounter: Payer: Self-pay | Admitting: Family Medicine

## 2021-10-26 VITALS — BP 138/68 | HR 91 | Temp 97.4°F | Wt 239.5 lb

## 2021-10-26 DIAGNOSIS — R2689 Other abnormalities of gait and mobility: Secondary | ICD-10-CM

## 2021-10-26 DIAGNOSIS — Z7189 Other specified counseling: Secondary | ICD-10-CM | POA: Diagnosis not present

## 2021-10-26 DIAGNOSIS — E785 Hyperlipidemia, unspecified: Secondary | ICD-10-CM

## 2021-10-26 DIAGNOSIS — R350 Frequency of micturition: Secondary | ICD-10-CM

## 2021-10-26 DIAGNOSIS — G40109 Localization-related (focal) (partial) symptomatic epilepsy and epileptic syndromes with simple partial seizures, not intractable, without status epilepticus: Secondary | ICD-10-CM

## 2021-10-26 DIAGNOSIS — E538 Deficiency of other specified B group vitamins: Secondary | ICD-10-CM

## 2021-10-26 DIAGNOSIS — G20 Parkinson's disease: Secondary | ICD-10-CM

## 2021-10-26 DIAGNOSIS — N3941 Urge incontinence: Secondary | ICD-10-CM

## 2021-10-26 DIAGNOSIS — G4733 Obstructive sleep apnea (adult) (pediatric): Secondary | ICD-10-CM

## 2021-10-26 DIAGNOSIS — E1121 Type 2 diabetes mellitus with diabetic nephropathy: Secondary | ICD-10-CM

## 2021-10-26 DIAGNOSIS — N1832 Chronic kidney disease, stage 3b: Secondary | ICD-10-CM

## 2021-10-26 DIAGNOSIS — E1140 Type 2 diabetes mellitus with diabetic neuropathy, unspecified: Secondary | ICD-10-CM

## 2021-10-26 DIAGNOSIS — N401 Enlarged prostate with lower urinary tract symptoms: Secondary | ICD-10-CM

## 2021-10-26 DIAGNOSIS — E669 Obesity, unspecified: Secondary | ICD-10-CM

## 2021-10-26 DIAGNOSIS — K760 Fatty (change of) liver, not elsewhere classified: Secondary | ICD-10-CM

## 2021-10-26 DIAGNOSIS — E1169 Type 2 diabetes mellitus with other specified complication: Secondary | ICD-10-CM | POA: Diagnosis not present

## 2021-10-26 DIAGNOSIS — R972 Elevated prostate specific antigen [PSA]: Secondary | ICD-10-CM

## 2021-10-26 DIAGNOSIS — Z0001 Encounter for general adult medical examination with abnormal findings: Secondary | ICD-10-CM

## 2021-10-26 DIAGNOSIS — R29898 Other symptoms and signs involving the musculoskeletal system: Secondary | ICD-10-CM

## 2021-10-26 DIAGNOSIS — E559 Vitamin D deficiency, unspecified: Secondary | ICD-10-CM

## 2021-10-26 DIAGNOSIS — I10 Essential (primary) hypertension: Secondary | ICD-10-CM

## 2021-10-26 MED ORDER — DAPAGLIFLOZIN PROPANEDIOL 10 MG PO TABS: 10.0000 mg | ORAL_TABLET | Freq: Every day | ORAL | 3 refills | Status: DC

## 2021-10-26 MED ORDER — METFORMIN HCL 1000 MG PO TABS: ORAL_TABLET | ORAL | 3 refills | Status: DC

## 2021-10-26 NOTE — Assessment & Plan Note (Signed)
Advanced directive: has this at home - in process of updated. Wife is HCPOA. Asked to bring [place] copy.  ?

## 2021-10-26 NOTE — Progress Notes (Signed)
Patient ID: Kirk Bennett., male    DOB: 1950/10/19, 71 y.o.   MRN: [id number]  This visit was conducted in person.  BP 138/68    Pulse 91    Temp (!) 97.4 F (36.3 C) (Temporal)    Wt 239 lb 8 oz (108.6 kg)    SpO2 95%    BMI 30.75 kg/m    CC: CPE Subjective:   HPI: Kirk Bartling. is a 70 y.o. male presenting on 10/26/2021 for Annual Exam   Has medicare part A.   Overactive bladder with urgency and urge incontinence followed by urology, receiving PTNS x12 with limited noted benefit.   DM - stable period on metformin, amaryl, farxiga.   Recent diagnosis parkinsonism through [facility], last seen 05/2021 and started on sinemet with planned close f/u (11/25/2021 with Dr Laurance Flatten). Had abnormal DatScan 04/2021. 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 Notes some left leg weakness without recent back pain.   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  Preventative: COLONOSCOPY WITH PROPOFOL 12/11/2015 mult polyps, few TA, rpt 64yr ([place]) - postponed due to pandemic COLONOSCOPY WITH PROPOFOL 09/02/2021 - TAs, HPs, int hem, distal ileal polyposis - benign, rpt 5 yrs ([place] J[facility] SRichardo Hanks DO) Prostate cancer screening - PSA increased to 4.4 last year, referred to urology with stable DRE and decreased PSA back to normal range in interim. Rec yearly monitoring at PCP's office.  Lung cancer screening - not eligible  Flu shot yearly COVID vaccine Moderna 08/2019, 09/2019, Moderna booster 06/2020, 01/2021 Tdap - 04/2011  Pneumovax 2013, [id number]  Had hepatitis at age 89yo(?A), hospitalized [facility] [place].   No hep B in past.  Shingrix - interested - will await completing covid vaccine.  Advanced directive: has this at home - in process of updated. Wife is HCPOA. Asked to bring [place].  Seat belt use  discussed.  Sunscreen use discussed. R groin mole enlarging.  Non smoker Alcohol - none  Dentist q6 mo  Eye exam yearly  Bowel - no constipation Bladder - ongoing urge incontinence over last few years - uses depends. Occasional full uncontrollable emptying. No stress incontinence symptoms. Seeing urology as per above.    Lives with wife and granddaughter, 1 cat Occupation: retired, worked aPhysicist, medical  Activity: has stationary bicycle at home  Diet: good water, good vegetables, follows diabetic diet      Relevant past medical, surgical, family and social history reviewed and updated as indicated. Interim medical history since our last visit reviewed. Allergies and medications reviewed and updated. Outpatient Medications Prior to Visit  Medication Sig Dispense Refill   atorvastatin (LIPITOR) 20 MG tablet Take 1 tablet (20 mg total) by mouth daily. 90 tablet 3   carbidopa-levodopa (SINEMET IR) 25-100 MG tablet PLEASE SEE ATTACHED FOR DETAILED DIRECTIONS     Cholecalciferol (VITAMIN D3) 1000 units CAPS Take 1,000 Units by mouth daily.     clotrimazole (LOTRIMIN AF) 1 % cream Apply 1 application topically 2 (two) times daily. 113 g 0   Cyanocobalamin (VITAMIN B12 PO) Take by mouth.     glimepiride (AMARYL) 4 MG tablet TAKE 1 TABLET BY MOUTH EVERY DAY WITH BREAKFAST 90 tablet 0   glucose blood (ONE TOUCH ULTRA TEST) test strip Use to check sugar fasting in the AM and 2 hours after lunch  or supper. Dx: E11.21 100 each 3   Insulin Pen Needle (B-D ULTRAFINE III SHORT PEN) 31G X 8 MM MISC Use to inject insulin daily 100 each 3   levETIRAcetam (KEPPRA) 1000 MG tablet Take 1,000 mg by mouth 2 (two) times daily.     oxybutynin (DITROPAN) 5 MG tablet Take 5 mg by mouth daily.     tamsulosin (FLOMAX) 0.4 MG CAPS capsule Take 0.4 mg by mouth. Take 30 minutes after the same meal each day     FARXIGA 5 MG TABS tablet TAKE 1 TABLET BY MOUTH EVERY DAY BEFORE BREAKFAST 30 tablet 6   lisinopril  (ZESTRIL) 10 MG tablet TAKE 1 TABLET BY MOUTH TWICE A DAY 180 tablet 3   metFORMIN (GLUCOPHAGE) 1000 MG tablet TAKE 1 TABLET BY MOUTH TWICE A DAY WITH MEALS 180 tablet 3   Facility-Administered Medications Prior to Visit  Medication Dose Route Frequency Provider Last Rate Last Admin   lidocaine (XYLOCAINE) 2 % jelly 1 application  1 application. Urethral Once [place], MD         Per HPI unless specifically indicated in ROS section below Review of Systems  Constitutional:  Negative for activity change, appetite change, chills, fatigue, fever and unexpected weight change.  HENT:  Negative for hearing loss.   Eyes:  Negative for visual disturbance.  Respiratory:  Negative for cough, chest tightness, shortness of breath and wheezing.   Cardiovascular:  Positive for leg swelling (R ankle swelling). Negative for chest pain and palpitations.  Gastrointestinal:  Negative for abdominal distention, abdominal pain, blood in stool, constipation, diarrhea, nausea and vomiting.  Genitourinary:  Negative for difficulty urinating and hematuria.  Musculoskeletal:  Negative for arthralgias, myalgias and neck pain.  Skin:  Negative for rash.  Neurological:  Negative for dizziness, seizures, syncope and headaches.       Ongoing imbalance, some left foot drop  Hematological:  Negative for adenopathy. Does not bruise/bleed easily.  Psychiatric/Behavioral:  Negative for dysphoric mood. The patient is not nervous/anxious.    Objective:  BP 138/68    Pulse 91    Temp (!) 97.4 F (36.3 C) (Temporal)    Wt 239 lb 8 oz (108.6 kg)    SpO2 95%    BMI 30.75 kg/m   Wt Readings from Last 3 Encounters:  10/26/21 239 lb 8 oz (108.6 kg)  09/02/21 220 lb (99.8 kg)  08/24/21 233 lb 8 oz (105.9 kg)      Physical Exam Vitals and nursing note reviewed.  Constitutional:      General: He is not in acute distress.    Appearance: Normal appearance. He is well-developed. He is not ill-appearing.     Comments:  Sitting in wheelchair today, normally ambulates with cane  HENT:     Head: Normocephalic and atraumatic.     Right Ear: Hearing, tympanic membrane, ear canal and external ear normal.     Left Ear: Hearing, tympanic membrane, ear canal and external ear normal.  Eyes:     General: No scleral icterus.    Extraocular Movements: Extraocular movements intact.     Conjunctiva/sclera: Conjunctivae normal.     Pupils: Pupils are equal, round, and reactive to light.  Neck:     Thyroid: No thyroid mass or thyromegaly.     Vascular: No carotid bruit.  Cardiovascular:     Rate and Rhythm: Normal rate and regular rhythm.     Pulses: Normal pulses.  Radial pulses are 2+ on the right side and 2+ on the left side.     Heart sounds: Normal heart sounds. No murmur heard. Pulmonary:     Effort: Pulmonary effort is normal. No respiratory distress.     Breath sounds: Normal breath sounds. No wheezing, rhonchi or rales.  Abdominal:     General: Bowel sounds are normal. There is no distension.     Palpations: Abdomen is soft. There is no mass.     Tenderness: There is no abdominal tenderness. There is no guarding or rebound.     Hernia: No hernia is present.  Musculoskeletal:        General: Normal range of motion.     Cervical back: Normal range of motion and neck supple.     Right lower leg: No edema.     Left lower leg: No edema.  Lymphadenopathy:     Cervical: No cervical adenopathy.  Skin:    General: Skin is warm and dry.     Findings: No rash.  Neurological:     General: No focal deficit present.     Mental Status: He is alert and oriented to person, place, and time.     Sensory: Sensation is intact.     Motor: Weakness present. No atrophy.     Coordination: Coordination abnormal.     Gait: Gait abnormal.     Comments:  5/5 strength BLE, subjective weakness Uses arms>legs to go from sitting to standing [place] (stooped posture) with ambulation, relies on cane and balancing  self with wall, shuffling gait Low voice volume  Psychiatric:        Mood and Affect: Mood normal.        Behavior: Behavior normal.        Thought Content: Thought content normal.        Judgment: Judgment normal.      Results for orders placed or performed in visit on 10/19/21  VITAMIN D 25 Hydroxy (Vit-D Deficiency, Fractures)  Result Value Ref Range   VITD 38.32 30.00 - 100.00 ng/mL  Vitamin B12  Result Value Ref Range   Vitamin B-12 296 211 - 911 pg/mL  CBC with Differential/Platelet  Result Value Ref Range   WBC 6.4 4.0 - 10.5 K/uL   RBC 5.25 4.22 - 5.81 Mil/uL   Hemoglobin 14.6 13.0 - 17.0 g/dL   HCT 44.9 39.0 - 52.0 %   MCV 85.4 78.0 - 100.0 fl   MCHC 32.6 30.0 - 36.0 g/dL   RDW 12.4 11.5 - 15.5 %   Platelets 184.0 150.0 - 400.0 K/uL   Neutrophils Relative % 52.4 43.0 - 77.0 %   Lymphocytes Relative 34.8 12.0 - 46.0 %   Monocytes Relative 7.2 3.0 - 12.0 %   Eosinophils Relative 5.0 0.0 - 5.0 %   Basophils Relative 0.6 0.0 - 3.0 %   Neutro Abs 3.3 1.4 - 7.7 K/uL   Lymphs Abs 2.2 0.7 - 4.0 K/uL   Monocytes Absolute 0.5 0.1 - 1.0 K/uL   Eosinophils Absolute 0.3 0.0 - 0.7 K/uL   Basophils Absolute 0.0 0.0 - 0.1 K/uL  PSA  Result Value Ref Range   PSA 0.71 0.10 - 4.00 ng/mL  Hemoglobin A1c  Result Value Ref Range   Hgb A1c MFr Bld 7.4 (H) 4.6 - 6.5 %  Comprehensive metabolic panel  Result Value Ref Range   Sodium 140 135 - 145 mEq/L   Potassium 4.0 3.5 - 5.1 mEq/L   Chloride  105 96 - 112 mEq/L   CO2 26 19 - 32 mEq/L   Glucose, Bld 207 (H) 70 - 99 mg/dL   BUN 21 6 - 23 mg/dL   Creatinine, Ser 1.65 (H) 0.40 - 1.50 mg/dL   Total Bilirubin 0.9 0.2 - 1.2 mg/dL   Alkaline Phosphatase 111 39 - 117 U/L   AST 8 0 - 37 U/L   ALT 2 0 - 53 U/L   Total Protein 7.0 6.0 - 8.3 g/dL   Albumin 4.4 3.5 - 5.2 g/dL   GFR 42.35 (L) >60.00 mL/min   Calcium 9.7 8.4 - 10.5 mg/dL  Lipid panel  Result Value Ref Range   Cholesterol 140 0 - 200 mg/dL   Triglycerides 67.0 0.0 -  149.0 mg/dL   HDL 39.30 >39.00 mg/dL   VLDL 13.4 0.0 - 40.0 mg/dL   LDL Cholesterol 87 0 - 99 mg/dL   Total CHOL/HDL Ratio 4    NonHDL 100.77     Assessment & Plan:  This visit occurred during the SARS-CoV-2 public health emergency.  Safety protocols were in place, including screening questions prior to the visit, additional usage of staff PPE, and extensive cleaning of exam room while observing appropriate contact time as indicated for disinfecting solutions.   Problem List Items Addressed This Visit     Encounter for general adult medical examination with abnormal findings - Primary (Chronic)    Preventative protocols reviewed and updated unless pt declined. Discussed healthy diet and lifestyle.       Advanced care planning/counseling discussion (Chronic)    Advanced directive: has this at home - in process of updated. Wife is HCPOA. Asked to bring [place] copy.       Type 2 diabetes mellitus with diabetic nephropathy (HCC)    Chronic, improved - with CKD will slowly drop metformin dose down to 500/'1000mg'$  daily and titrate farxiga to full dose of '10mg'$  daily. Continue amaryl '4mg'$  daily with breakfast. No hypoglycemia, no UTI/yeast infection.       Relevant Medications   dapagliflozin propanediol (FARXIGA) 10 MG TABS tablet   metFORMIN (GLUCOPHAGE) 1000 MG tablet   Hyperlipidemia associated with type 2 diabetes mellitus (HCC)    Chronic, continue atorvastatin '20mg'$  daily  The 10-year ASCVD risk score (Arnett DK, et al., 2019) is: 34.1%   Values used to calculate the score:     Age: 68 years     Sex: Male     Is Non-Hispanic African American: Yes     Diabetic: Yes     Tobacco smoker: No     Systolic Blood Pressure: 884 mmHg     Is BP treated: Yes     HDL Cholesterol: 39.3 mg/dL     Total Cholesterol: 140 mg/dL       Relevant Medications   dapagliflozin propanediol (FARXIGA) 10 MG TABS tablet   metFORMIN (GLUCOPHAGE) 1000 MG tablet   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       Essential hypertension, benign    Chronic, stable on lisinopril '10mg'$  bid.  Monitor BP on higher farxiga dose, may be able to drop lisinopril dosing.       Stage 3 chronic kidney disease (HCC)    Stage 3a CKD with GFR 42. Increase farxiga to '10mg'$  daily.       Vitamin D deficiency    Continue vit D 1000 IU daily.  Low vitamin B12 level    Continue oral b12 daily replacement - they will check dose at home.       Elevated PSA    This has resolved. Continue yearly PSA      OSA (obstructive sleep apnea)   Obesity, Class I, BMI 30.0-34.9 (see actual BMI)    Continue to encourage efforts towards weight loss.       NAFLD (nonalcoholic fatty liver disease)   Weakness of both lower extremities    Largely subjective weakness - would benefit from rpt outpatient PT - referral placed.       Relevant Orders   Ambulatory referral to Physical Therapy   Urge urinary incontinence    Chronic, seeing urology receiving PTNS treatments, also on oxybutynin. Previously tried [employer] and gemtesa      Impaired gait and mobility    Ongoing abnormal gait. Now followed by [facility], diagnosed with parkinsonisms on sinemet. Has upcoming f/u next month.  Will refer to outpatient PT - interested in their recommendations for ambulatory assistive device optimization.      Relevant Orders   Ambulatory referral to Physical Therapy   Type 2 diabetes mellitus with diabetic neuropathy, unspecified (HCC)   Relevant Medications   dapagliflozin propanediol (FARXIGA) 10 MG TABS tablet   metFORMIN (GLUCOPHAGE) 1000 MG tablet   BPH (benign prostatic hyperplasia)    Continue yearly PSA.       Other Visit Diagnoses     Parkinsonism, unspecified Parkinsonism type ([place])       Relevant Orders   Ambulatory referral to Physical Therapy        Meds ordered this encounter  Medications   dapagliflozin  propanediol (FARXIGA) 10 MG TABS tablet    Sig: Take 1 tablet (10 mg total) by mouth daily.    Dispense:  30 tablet    Refill:  3   metFORMIN (GLUCOPHAGE) 1000 MG tablet    Sig: Take 0.5 tablets (500 mg total) by mouth daily with breakfast AND 1 tablet (1,000 mg total) daily with supper.    Dispense:  135 tablet    Refill:  3    Note new sig   Orders Placed This Encounter  Procedures   Ambulatory referral to Physical Therapy    Referral Priority:   Routine    Referral Type:   Physical Medicine    Referral Reason:   Specialty Services Required    Requested Specialty:   Physical Therapy    Number of Visits Requested:   1    Patient instructions: If interested, check with pharmacy about new 2 shot shingles series (shingrix).  Bring [place] a copy of living will to update your chart.  Drop metformin dose to '500mg'$  in the morning (1/2 tab) and '1000mg'$  at night time (full tab). Increase farxiga to '10mg'$  daily.  We will refer you back to outpatient physical therapy in [place].  Return in 3-4 months for diabetes follow up visit  Follow up plan: Return in about 3 months (around 01/26/2022) for follow up visit.  Ria Bush, MD

## 2021-10-26 NOTE — Assessment & Plan Note (Signed)
Preventative protocols reviewed and updated unless pt declined. Discussed healthy diet and lifestyle.  

## 2021-10-26 NOTE — Patient Instructions (Addendum)
If interested, check with pharmacy about new 2 shot shingles series (shingrix).  ?Bring [place] a copy of living will to update your chart.  ?Drop metformin dose to '500mg'$  in the morning (1/2 tab) and '1000mg'$  at night time (full tab). Increase farxiga to '10mg'$  daily.  ?We will refer you back to outpatient physical therapy in [place].  ?Return in 3-4 months for diabetes follow up visit.  ? ?Health Maintenance After Age 69 ?After age 49, you are at a higher risk for certain long-term diseases and infections as well as injuries from falls. Falls are a major cause of broken bones and head injuries in people who are older than age 73. Getting regular preventive care can help to keep you healthy and well. Preventive care includes getting regular testing and making lifestyle changes as recommended by your health care provider. Talk with your health care provider about: ?Which screenings and tests you should have. A screening is a test that checks for a disease when you have no symptoms. ?A diet and exercise plan that is right for you. ?What should I know about screenings and tests to prevent falls? ?Screening and testing are the best ways to find a health problem early. Early diagnosis and treatment give you the best chance of managing medical conditions that are common after age 16. Certain conditions and lifestyle choices may make you more likely to have a fall. Your health care provider may recommend: ?Regular vision checks. Poor vision and conditions such as cataracts can make you more likely to have a fall. If you wear glasses, make sure to get your prescription updated if your vision changes. ?Medicine review. Work with your health care provider to regularly review all of the medicines you are taking, including over-the-counter medicines. Ask your health care provider about any side effects that may make you more likely to have a fall. Tell your health care provider if any medicines that you take make you feel dizzy or  sleepy. ?Strength and balance checks. Your health care provider may recommend certain tests to check your strength and balance while standing, walking, or changing positions. ?Foot health exam. Foot pain and numbness, as well as not wearing proper footwear, can make you more likely to have a fall. ?Screenings, including: ?Osteoporosis screening. Osteoporosis is a condition that causes the bones to get weaker and break more easily. ?Blood pressure screening. Blood pressure changes and medicines to control blood pressure can make you feel dizzy. ?Depression screening. You may be more likely to have a fall if you have a fear of falling, feel depressed, or feel unable to do activities that you used to do. ?Alcohol use screening. Using too much alcohol can affect your balance and may make you more likely to have a fall. ?Follow these instructions at home: ?Lifestyle ?Do not drink alcohol if: ?Your health care provider tells you not to drink. ?If you drink alcohol: ?Limit how much you have to: ?0-1 drink a day for women. ?0-2 drinks a day for men. ?Know how much alcohol is in your drink. In the [place] one drink equals one 12 oz bottle of beer (355 mL), one 5 oz glass of wine (148 mL), or one 1? oz glass of hard liquor (44 mL). ?Do not use any products that contain nicotine or tobacco. These products include cigarettes, chewing tobacco, and vaping devices, such as e-cigarettes. If you need help quitting, ask your health care provider. ?Activity ? ?Follow a regular exercise program to stay fit. This will  help you maintain your balance. Ask your health care provider what types of exercise are appropriate for you. ?If you need a cane or walker, use it as recommended by your health care provider. ?Wear supportive shoes that have nonskid soles. ?Safety ? ?Remove any tripping hazards, such as rugs, cords, and clutter. ?Install safety equipment such as grab bars in bathrooms and safety rails on stairs. ?Keep rooms and walkways  well-lit. ?General instructions ?Talk with your health care provider about your risks for falling. Tell your health care provider if: ?You fall. Be sure to tell your health care provider about all falls, even ones that seem minor. ?You feel dizzy, tiredness (fatigue), or off-balance. ?Take over-the-counter and prescription medicines only as told by your health care provider. These include supplements. ?Eat a healthy diet and maintain a healthy weight. A healthy diet includes low-fat dairy products, low-fat (lean) meats, and fiber from whole grains, beans, and lots of fruits and vegetables. ?Stay current with your vaccines. ?Schedule regular health, dental, and eye exams. ?Summary ?Having a healthy lifestyle and getting preventive care can help to protect your health and wellness after age 47. ?Screening and testing are the best way to find a health problem early and help you avoid having a fall. Early diagnosis and treatment give you the best chance for managing medical conditions that are more common for people who are older than age 65. ?Falls are a major cause of broken bones and head injuries in people who are older than age 82. Take precautions to prevent a fall at home. ?Work with your health care provider to learn what changes you can make to improve your health and wellness and to prevent falls. ?This information is not intended to replace advice given to you by your health care provider. Make sure you discuss any questions you have with your health care provider. ?Document Revised: 12/28/2020 Document Reviewed: 12/28/2020 ?Elsevier Patient Education ? [place]. ? ?

## 2021-10-27 ENCOUNTER — Other Ambulatory Visit: Payer: Self-pay | Admitting: Family Medicine

## 2021-10-28 NOTE — Assessment & Plan Note (Addendum)
Chronic, stable on lisinopril '10mg'$  bid.  ?Monitor BP on higher farxiga dose, may be able to drop lisinopril dosing.  ?

## 2021-10-28 NOTE — Assessment & Plan Note (Addendum)
Chronic, improved - with CKD will slowly drop metformin dose down to 500/'1000mg'$  daily and titrate farxiga to full dose of '10mg'$  daily. Continue amaryl '4mg'$  daily with breakfast. No hypoglycemia, no UTI/yeast infection.  ?

## 2021-10-28 NOTE — Assessment & Plan Note (Signed)
Chronic, seeing urology receiving PTNS treatments, also on oxybutynin. Previously tried [place] and gemtesa ?

## 2021-10-28 NOTE — Assessment & Plan Note (Signed)
Continue to encourage efforts towards weight loss.  ?

## 2021-10-28 NOTE — Assessment & Plan Note (Signed)
This has resolved. Continue yearly PSA ?

## 2021-10-28 NOTE — Assessment & Plan Note (Addendum)
Continue [facility] neuro f/u - on keppra '1000mg'$  b id.  ?

## 2021-10-28 NOTE — Assessment & Plan Note (Addendum)
Ongoing abnormal gait. Now followed by [facility], diagnosed with parkinsonisms on sinemet. Has upcoming f/u next month.  ?Will refer to outpatient PT - interested in their recommendations for ambulatory assistive device optimization. ?

## 2021-10-28 NOTE — Assessment & Plan Note (Addendum)
Largely subjective weakness - would benefit from rpt outpatient PT - referral placed.  ?

## 2021-10-28 NOTE — Assessment & Plan Note (Signed)
Continue oral b12 daily replacement - they will check dose at home.  ?

## 2021-10-28 NOTE — Assessment & Plan Note (Signed)
Continue yearly PSA.  

## 2021-10-28 NOTE — Assessment & Plan Note (Signed)
Continue vit D 1000 IU daily. 

## 2021-10-28 NOTE — Assessment & Plan Note (Signed)
Chronic, continue atorvastatin '20mg'$  daily  ?The 10-year ASCVD risk score (Arnett DK, et al., 2019) is: 34.1% ?  Values used to calculate the score: ?    Age: 71 years ?    Sex: Male ?    Is Non-Hispanic African American: Yes ?    Diabetic: Yes ?    Tobacco smoker: No ?    Systolic Blood Pressure: 919 mmHg ?    Is BP treated: Yes ?    HDL Cholesterol: 39.3 mg/dL ?    Total Cholesterol: 140 mg/dL  ?

## 2021-10-28 NOTE — Assessment & Plan Note (Addendum)
Stage 3a CKD with GFR 42. Increase farxiga to '10mg'$  daily.  ?

## 2021-11-15 NOTE — Progress Notes (Addendum)
12/11/21 ?10:37 AM  ? ?Kirk Bennett. ?08-12-1951 ?[id number] ? ?Referring provider:  ?Ria Bush, MD ?[address] ?[place],  [place] [id number] ? ?Chief Complaint  ?Patient presents with  ? Urinary Incontinence  ? Over Active Bladder  ? ? ?Urological history  ?1. Urge incontinence ?-contributing factors of age, Parkinson's disease, diabetes, HTN, depression and obesity ?-cysto 2022 - moderate size prostate with a high bladder neck, but the bladder was grossly normal throughout ?- PVR 98 ml ?- IPSS 23/6 ?-failed tamsulosin 0.4 mg daily ?-oxybutynin intolerable side effects ?-Myrbetriq cost prohibitive ?- Managed on PTNS ? ? ?HPI: ?Kirk Bennett. is a 71 y.o.male who presents today for post PTNS discussion.  ? ?He reports that PTNS has not had any symptomatic improvement.  ? ?He continues to have leakage. Patient denies any modifying or aggravating factors.  Patient denies any gross hematuria, dysuria or suprapubic/flank pain.  Patient denies any fevers, chills, nausea or vomiting.  ? ? ? IPSS   ? ? [place] Name 11/16/21 1300  ?  ?  ?  ? International Prostate Symptom Score  ? How often have you had the sensation of not emptying your bladder? More than half the time    ? How often have you had to urinate less than every two hours? More than half the time    ? How often have you found you stopped and started again several times when you urinated? More than half the time    ? How often have you found it difficult to postpone urination? About half the time    ? How often have you had a weak urinary stream? About half the time    ? How often have you had to strain to start urination? About half the time    ? How many times did you typically get up at night to urinate? 2 Times    ? Total IPSS Score 23    ?  ? Quality of Life due to urinary symptoms  ? If you were to spend the rest of your life with your urinary condition just the way it is now how would you feel about that? Terrible    ? ?  ?  ? ?   ? ? ?Score:  ?1-7 Mild ?8-19 Moderate ?20-35 Severe ? ? ?PMH: ?Past Medical History:  ?Diagnosis Date  ? Cataracts, bilateral 02/2011  ? and suspected glaucoma, to return for f/u, no diabetic retinopathy  ? CKD stage 3 due to type 2 diabetes mellitus ([place]) 2015  ? normal renal US, self referred to Dr Holley Raring  ? 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 from surgery for R temporal arachnoid cyst s/p drainage, possible continued sz so changed to lamotrigine ([facility] at [facility])  ? Depression   ? found by neuro  ? Elevated PSA 05/27/2015  ? Serial monitoring ([place])   ? Glaucoma 2015  ? suspect  ? History of hepatitis A   ? HLD (hyperlipidemia)   ? HTN (hypertension)   ? Knee pain   ? s/p replacement  ? SVT (supraventricular tachycardia) ([place]) 1996  ? Vitamin D deficiency 03/02/2015  ? 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 established with Dr. Gabriel Carina endo --> 02/2016 decided to return to PCP for DM care  ? ? ?Surgical History: ?Past Surgical History:  ?Procedure Laterality Date  ? CARDIAC CATHETERIZATION  02/19/2009  ? No blockages (Dr. Liliane Shi)  ? COLONOSCOPY  09/16/2005  ? hyperplastic polyps, rpt due 10 yrs   ? COLONOSCOPY WITH PROPOFOL N/A 12/11/2015  ? mult polyps, few TA, rpt 43yr (Gustavo Lah  ? COLONOSCOPY WITH PROPOFOL N/A 09/02/2021  ? TAs, HPs, int hem, distal ileal polyposis - benign, rpt yrs([place], SRichardo Hanks DO)  ? corrective surgery amblyopia  08/23/1955  ? Cystectomy or meningioma removal brain  08/22/1988  ? (unclear)  ? Left knee surgery  08/22/1969  ? Torn ACL  ? REPLACEMENT TOTAL KNEE  11/20/2009  ? [facility]  ? ? ?Home Medications:  ?Allergies as of 11/16/2021   ? ?   Reactions  ? Bee Venom Shortness Of Breath  ? Demerol [meperidine Hcl] Nausea And Vomiting  ? ?  ? ?  ?Medication List  ?  ? ?  ? Accurate as of November 16, 2021 11:59 PM. If you have any questions, ask your nurse or doctor.  ?  ?  ? ?  ? ?atorvastatin 20 MG tablet ?Commonly known as:  LIPITOR ?Take 1 tablet (20 mg total) by mouth daily. ?  ?B-D ULTRAFINE III SHORT PEN 31G X 8 MM Misc ?Generic drug: Insulin Pen Needle ?Use to inject insulin daily ?  ?carbidopa-levodopa 25-100 MG tablet ?Commonly known as: SINEMET IR ?PLEASE SEE ATTACHED FOR DETAILED DIRECTIONS ?  ?clotrimazole 1 % cream ?Commonly known as: Lotrimin AF ?Apply 1 application topically 2 (two) times daily. ?  ?dapagliflozin propanediol 10 MG Tabs tablet ?Commonly known as: FIran?Take 1 tablet (10 mg total) by mouth daily. ?  ?glimepiride 4 MG tablet ?Commonly known as: AMARYL ?TAKE 1 TABLET BY MOUTH EVERY DAY WITH BREAKFAST ?  ?glucose blood test strip ?Commonly known as: ONE TOUCH ULTRA TEST ?Use to check sugar fasting in the AM and 2 hours after lunch or supper. Dx: E11.21 ?  ?levETIRAcetam 1000 MG tablet ?Commonly known as: KEPPRA ?Take 1,000 mg by mouth 2 (two) times daily. ?  ?lisinopril 10 MG tablet ?Commonly known as: ZESTRIL ?TAKE 1 TABLET BY MOUTH TWICE A DAY ?  ?metFORMIN 1000 MG tablet ?Commonly known as: GLUCOPHAGE ?Take 0.5 tablets (500 mg total) by mouth daily with breakfast AND 1 tablet (1,000 mg total) daily with supper. ?  ?oxybutynin 5 MG tablet ?Commonly known as: DITROPAN ?Take 5 mg by mouth daily. ?  ?tamsulosin 0.4 MG Caps capsule ?Commonly known as: FLOMAX ?Take 0.4 mg by mouth. Take 30 minutes after the same meal each day ?  ?VITAMIN B12 PO ?Take by mouth. ?  ?Vitamin D3 25 MCG (1000 UT) Caps ?Take 1,000 Units by mouth daily. ?  ? ?  ? ? ?Allergies:  ?Allergies  ?Allergen Reactions  ? Bee Venom Shortness Of Breath  ? Demerol [Meperidine Hcl] Nausea And Vomiting  ? ? ?Family History: ?Family History  ?Problem Relation Age of Onset  ? Heart failure Father   ? Cancer Mother   ?     Lung, smoker  ? Aortic dissection Mother   ?     Aortic rupture  ? Mental illness Sister   ?     657years old  ? Diabetes Maternal Aunt   ? Diabetes Cousin   ? Cancer Cousin   ?     Colon age 69 ? CAD Neg Hx   ? ? ?Social History:   reports that he has never smoked. He has never used smokeless tobacco. He reports that he does not drink alcohol and does not use drugs. ? ? ?Physical Exam: ?  BP 125/79   Pulse 79   Ht '6\' 2"'$  (1.88 m)   Wt 239 lb (108.4 kg)   BMI 30.69 kg/m?   ?Constitutional:  Alert and oriented, No acute distress. ?HEENT: [place] AT, moist mucus membranes.  Trachea midline, no masses. ?Cardiovascular: No clubbing, cyanosis, or edema. ?Respiratory: Normal respiratory effort, no increased work of breathing. ?Neurologic: Grossly intact, no focal deficits, moving all 4 extremities. ?Psychiatric: Normal mood and affect. ? ? ?Laboratory Data: ?Lab Results  ?Component Value Date  ? CREATININE 1.65 (H) 10/19/2021  ? ?Lab Results  ?Component Value Date  ? HGBA1C 7.4 (H) 10/19/2021  ? ?Component ?    Latest Ref Rng 10/19/2021  ?VITD ?    30.00 - 100.00 ng/mL 38.32   ? ?Component ?    Latest Ref Rng 10/19/2021  ?Vitamin B12 ?    211 - 911 pg/mL 296   ? ?Component ?    Latest Ref Rng 10/19/2021  ?PSA ?    0.10 - 4.00 ng/mL 0.71   ? ? ?Component ?    Latest Ref Rng 10/19/2021  ?Sodium ?    135 - 145 mEq/L 140   ?Potassium ?    3.5 - 5.1 mEq/L 4.0   ?Chloride ?    96 - 112 mEq/L 105   ?CO2 ?    19 - 32 mEq/L 26   ?Glucose ?    70 - 99 mg/dL 207 (H)   ?BUN ?    6 - 23 mg/dL 21   ?Creatinine ?    0.40 - 1.50 mg/dL 1.65 (H)   ?Albumin ?    3.5 - 5.2 g/dL 4.4   ?Calcium ?    8.4 - 10.5 mg/dL 9.7   ?Total Protein ?    6.0 - 8.3 g/dL 7.0   ?Total Bilirubin ?    0.2 - 1.2 mg/dL 0.9   ?AST ?    0 - 37 U/L 8   ?ALT ?    0 - 53 U/L 2   ?GFR ?    >60.00 mL/min 42.35 (L)   ?Alkaline Phosphatase ?    39 - 117 U/L 111   ?  ?(H) High ?(L) Low ? ?Component ?    Latest Ref Rng 10/19/2021  ?Cholesterol ?    0 - 200 mg/dL 140   ?HDL Cholesterol ?    >39.00 mg/dL 39.30   ?Triglycerides ?    0.0 - 149.0 mg/dL 67.0   ?Total CHOL/HDL Ratio 4   ?VLDL ?    0.0 - 40.0 mg/dL 13.4   ?LDL (calc) ?    0 - 99 mg/dL 87   ?NonHDL 100.77   ? ? ?Pertinent Imaging: ?Results for orders  placed or performed in visit on 11/16/21  ?Bladder Scan (Post Void Residual) in office  ?Result Value Ref Range  ? Scan Result 98 ml   ? ? ?Assessment & Plan:   ? ?Urge incontinence  ?-Failed tamsulosin, oxybutynin,

## 2021-11-16 ENCOUNTER — Other Ambulatory Visit: Payer: Self-pay

## 2021-11-16 ENCOUNTER — Ambulatory Visit: Payer: 59 | Admitting: Urology

## 2021-11-16 VITALS — BP 125/79 | HR 79 | Ht 74.0 in | Wt 239.0 lb

## 2021-11-16 DIAGNOSIS — N3281 Overactive bladder: Secondary | ICD-10-CM

## 2021-11-16 DIAGNOSIS — N3941 Urge incontinence: Secondary | ICD-10-CM

## 2021-11-16 LAB — BLADDER SCAN AMB NON-IMAGING: Scan Result: 98

## 2021-12-11 ENCOUNTER — Encounter: Payer: Self-pay | Admitting: Urology

## 2021-12-26 ENCOUNTER — Other Ambulatory Visit: Payer: Self-pay | Admitting: Family Medicine

## 2021-12-27 ENCOUNTER — Ambulatory Visit: Payer: 59 | Admitting: Urology

## 2022-01-03 ENCOUNTER — Ambulatory Visit: Payer: 59 | Admitting: Urology

## 2022-01-03 ENCOUNTER — Encounter: Payer: Self-pay | Admitting: Urology

## 2022-01-03 VITALS — BP 156/75 | HR 84 | Ht 74.0 in | Wt 239.0 lb

## 2022-01-03 DIAGNOSIS — N3946 Mixed incontinence: Secondary | ICD-10-CM | POA: Diagnosis not present

## 2022-01-03 DIAGNOSIS — N3281 Overactive bladder: Secondary | ICD-10-CM | POA: Diagnosis not present

## 2022-01-03 MED ORDER — SOLIFENACIN SUCCINATE 5 MG PO TABS: 5.0000 mg | ORAL_TABLET | Freq: Every day | ORAL | 11 refills | Status: DC

## 2022-01-03 NOTE — Progress Notes (Signed)
? ?01/03/2022 ?10:31 AM  ? ?Kirk Bennett. ?Mar 22, 1951 ?[id number] ? ?Referring provider: Ria Bush, MD ?[address] ?[place],   [address] ? ?Chief Complaint  ?Patient presents with  ? Over Active Bladder  ? ? ?HPI: ?SN: Overactive bladder with urgency incontinence on oxybutynin.  2 positive urine cultures.  He may have colonization.  Large prostate and high bladder neck August 22 cystoscopy.  He has Parkinson's and is not a good historian.  Myrbetriq may have helped in the past but was too expensive.  It was not felt that he would tolerate urodynamics since he did not tolerate the cystoscopy well.  Was given Gemtesa with no benefit and offered and failed percutaneous tibial nerve stimulation.  When seen by nurse practitioner recently residual was 177 mL.  He did fail Flomax.  Subsequent residual was 98 mL.  Last visit discussed condom catheter. ? ?According to the medical record patient on Flomax and oxybutynin but his wife was not sure if he was on anything at this stage.  ? ?He soaks 3 pull-ups a day.  He can walk with a cane.  He has moderately severe bedwetting and can leak without awareness.  He has a poor flow.  He hesitates.  He strains.  He does not think he could urinate if he did not strain.  He feels empty. ? ?Clinically not infected today ? ? ? ? ?PMH: ?Past Medical History:  ?Diagnosis Date  ? Cataracts, bilateral 02/2011  ? and suspected glaucoma, to return for f/u, no diabetic retinopathy  ? CKD stage 3 due to type 2 diabetes mellitus ([place]) 2015  ? normal renal US, self referred to Dr Holley Raring  ? 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 from surgery for R temporal arachnoid cyst s/p drainage, possible continued sz so changed to lamotrigine (Dr. Mora Bellman at [facility])  ? Depression   ? found by neuro  ? Elevated PSA 05/27/2015  ? Serial monitoring ([place])   ? Glaucoma 2015  ? suspect  ? History of hepatitis A   ? HLD (hyperlipidemia)   ? HTN (hypertension)   ? Knee pain   ? s/p  replacement  ? SVT (supraventricular tachycardia) ([place]) 1996  ? Vitamin D deficiency 03/02/2015  ? 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 established with Dr. Gabriel Carina endo --> 02/2016 decided to return to PCP for DM care  ? ? ?Surgical History: ?Past Surgical History:  ?Procedure Laterality Date  ? CARDIAC CATHETERIZATION  02/19/2009  ? No blockages (Dr. Liliane Shi)  ? COLONOSCOPY  09/16/2005  ? hyperplastic polyps, rpt due 10 yrs   ? COLONOSCOPY WITH PROPOFOL N/A 12/11/2015  ? mult polyps, few TA, rpt 62yr (Gustavo Lah  ? COLONOSCOPY WITH PROPOFOL N/A 09/02/2021  ? TAs, HPs, int hem, distal ileal polyposis - benign, rpt yrs([place], SRichardo Hanks DO)  ? corrective surgery amblyopia  08/23/1955  ? Cystectomy or meningioma removal brain  08/22/1988  ? (unclear)  ? Left knee surgery  08/22/1969  ? Torn ACL  ? REPLACEMENT TOTAL KNEE  11/20/2009  ? [facility]  ? ? ?Home Medications:  ?Allergies as of 01/03/2022   ? ?   Reactions  ? Bee Venom Shortness Of Breath  ? Demerol [meperidine Hcl] Nausea And Vomiting  ? ?  ? ?  ?Medication List  ?  ? ?  ? Accurate as of Jan 03, 2022 10:31 AM. If you have any questions, ask your nurse or  doctor.  ?  ?  ? ?  ? ?atorvastatin 20 MG tablet ?Commonly known as: LIPITOR ?TAKE 1 TABLET BY MOUTH EVERY DAY ?  ?B-D ULTRAFINE III SHORT PEN 31G X 8 MM Misc ?Generic drug: Insulin Pen Needle ?Use to inject insulin daily ?  ?carbidopa-levodopa 25-100 MG tablet ?Commonly known as: SINEMET IR ?PLEASE SEE ATTACHED FOR DETAILED DIRECTIONS ?  ?clotrimazole 1 % cream ?Commonly known as: Lotrimin AF ?Apply 1 application topically 2 (two) times daily. ?  ?dapagliflozin propanediol 10 MG Tabs tablet ?Commonly known as: [place] ?Take 1 tablet (10 mg total) by mouth daily. ?  ?glimepiride 4 MG tablet ?Commonly known as: AMARYL ?TAKE 1 TABLET BY MOUTH EVERY DAY WITH BREAKFAST ?  ?glucose blood test strip ?Commonly known as: ONE TOUCH ULTRA TEST ?Use to check  sugar fasting in the AM and 2 hours after lunch or supper. Dx: E11.21 ?  ?levETIRAcetam 1000 MG tablet ?Commonly known as: KEPPRA ?Take 1,000 mg by mouth 2 (two) times daily. ?  ?lisinopril 10 MG tablet ?Commonly known as: ZESTRIL ?TAKE 1 TABLET BY MOUTH TWICE A DAY ?  ?metFORMIN 1000 MG tablet ?Commonly known as: GLUCOPHAGE ?Take 0.5 tablets (500 mg total) by mouth daily with breakfast AND 1 tablet (1,000 mg total) daily with supper. ?  ?oxybutynin 5 MG tablet ?Commonly known as: DITROPAN ?Take 5 mg by mouth daily. ?  ?tamsulosin 0.4 MG Caps capsule ?Commonly known as: FLOMAX ?Take 0.4 mg by mouth. Take 30 minutes after the same meal each day ?  ?VITAMIN B12 PO ?Take by mouth. ?  ?Vitamin D3 25 MCG (1000 UT) Caps ?Take 1,000 Units by mouth daily. ?  ? ?  ? ? ?Allergies:  ?Allergies  ?Allergen Reactions  ? Bee Venom Shortness Of Breath  ? Demerol [Meperidine Hcl] Nausea And Vomiting  ? ? ?Family History: ?Family History  ?Problem Relation Age of Onset  ? Heart failure Father   ? Cancer Mother   ?     Lung, smoker  ? Aortic dissection Mother   ?     Aortic rupture  ? Mental illness Sister   ?     76 years old  ? Diabetes Maternal Aunt   ? Diabetes Cousin   ? Cancer Cousin   ?     Colon age 35  ? CAD Neg Hx   ? ? ?Social History:  reports that he has never smoked. He has never used smokeless tobacco. He reports that he does not drink alcohol and does not use drugs. ? ?ROS: ?  ? ?  ? ?  ? ?  ? ?  ? ?  ? ?  ? ?  ? ?  ? ?  ? ?  ? ?  ? ?  ? ?Physical Exam: ?BP (!) 156/75   Pulse 84   Ht '6\' 2"'$  (1.88 m)   Wt 108.4 kg   BMI 30.69 kg/m?   ?Constitutional:  Alert and oriented, No acute distress. ? ? ?Laboratory Data: ?Lab Results  ?Component Value Date  ? WBC 6.4 10/19/2021  ? HGB 14.6 10/19/2021  ? HCT 44.9 10/19/2021  ? MCV 85.4 10/19/2021  ? PLT 184.0 10/19/2021  ? ? ?Lab Results  ?Component Value Date  ? CREATININE 1.65 (H) 10/19/2021  ? ? ?Lab Results  ?Component Value Date  ? PSA 0.71 10/19/2021  ? PSA 1.21  09/25/2020  ? PSA 1.6 09/20/2019  ? ? ?No results found for: TESTOSTERONE ? ?Lab Results  ?  Component Value Date  ? HGBA1C 7.4 (H) 10/19/2021  ? ? ?Urinalysis ?   ?Component Value Date/Time  ? COLORURINE YELLOW 12/01/2020 1254  ? APPEARANCEUR CLOUDY (A) 12/01/2020 1254  ? LABSPEC 1.018 12/01/2020 1254  ? PHURINE 5.5 12/01/2020 1254  ? GLUCOSEU 3+ (A) 12/01/2020 1254  ? HGBUR NEGATIVE 12/01/2020 1254  ? BILIRUBINUR negative 10/06/2020 1144  ? [place] NEGATIVE 12/01/2020 1254  ? PROTEINUR NEGATIVE 12/01/2020 1254  ? UROBILINOGEN 0.2 10/06/2020 1144  ? NITRITE NEGATIVE 12/01/2020 1254  ? LEUKOCYTESUR NEGATIVE 12/01/2020 1254  ? ? ?Pertinent Imaging: ? ? 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  Apparently did not tolerate cystoscopy well.  His flow symptoms also could be due to his neurogenic bladder with Parkinson's.  In my opinion a prostate outlet procedure would not likely be in his best interest in his incontinence certainly could worsen.  I do not think he should have Botox.  I am not certain if urinary prophylaxis would help any of his symptoms with 2 recent positive cultures.  He said he can lay on his stomach and we could do a peripheral nerve evaluation recognizing success rates may be lower.  I mention this today but like to try 1 more antimuscarinic.  Reassess on Vesicare 5 mg 30x11.  Stop oxybutynin.  Try to get a urine culture again.  Stay on the Flomax ? ?There are no diagnoses linked to this encounter. ? ?No follow-ups on file. ? ?Reece Packer, MD ? ?[facility] ?[address], [address] ?[place], [place] [address] ?[phone] ?  ?

## 2022-01-03 NOTE — Addendum Note (Signed)
Addended by: Verlene Mayer A on: 01/03/2022 11:10 AM ? ? Modules accepted: Orders ? ?

## 2022-01-26 ENCOUNTER — Encounter: Payer: Self-pay | Admitting: Family Medicine

## 2022-01-26 ENCOUNTER — Ambulatory Visit: Payer: 59 | Admitting: Family Medicine

## 2022-01-26 VITALS — BP 140/76 | HR 90 | Temp 97.7°F | Ht 74.0 in | Wt 237.4 lb

## 2022-01-26 DIAGNOSIS — G20C Parkinsonism, unspecified: Secondary | ICD-10-CM | POA: Insufficient documentation

## 2022-01-26 DIAGNOSIS — I10 Essential (primary) hypertension: Secondary | ICD-10-CM

## 2022-01-26 DIAGNOSIS — E1121 Type 2 diabetes mellitus with diabetic nephropathy: Secondary | ICD-10-CM | POA: Diagnosis not present

## 2022-01-26 DIAGNOSIS — G20 Parkinson's disease: Secondary | ICD-10-CM | POA: Diagnosis not present

## 2022-01-26 DIAGNOSIS — E1140 Type 2 diabetes mellitus with diabetic neuropathy, unspecified: Secondary | ICD-10-CM

## 2022-01-26 DIAGNOSIS — N1832 Chronic kidney disease, stage 3b: Secondary | ICD-10-CM | POA: Diagnosis not present

## 2022-01-26 DIAGNOSIS — R2689 Other abnormalities of gait and mobility: Secondary | ICD-10-CM

## 2022-01-26 DIAGNOSIS — E538 Deficiency of other specified B group vitamins: Secondary | ICD-10-CM

## 2022-01-26 DIAGNOSIS — N3941 Urge incontinence: Secondary | ICD-10-CM

## 2022-01-26 LAB — RENAL FUNCTION PANEL
Albumin: 4.3 g/dL (ref 3.5–5.2)
BUN: 16 mg/dL (ref 6–23)
CO2: 26 mEq/L (ref 19–32)
Calcium: 9.8 mg/dL (ref 8.4–10.5)
Chloride: 103 mEq/L (ref 96–112)
Creatinine, Ser: 1.5 mg/dL (ref 0.40–1.50)
GFR: 47.39 mL/min — ABNORMAL LOW (ref >60.00)
Glucose, Bld: 313 mg/dL — ABNORMAL HIGH (ref 70–99)
Phosphorus: 2.7 mg/dL (ref 2.3–4.6)
Potassium: 3.9 mEq/L (ref 3.5–5.1)
Sodium: 138 mEq/L (ref 135–145)

## 2022-01-26 LAB — POCT GLYCOSYLATED HEMOGLOBIN (HGB A1C): Hemoglobin A1C: 7.8 % — AB (ref 4.0–5.6)

## 2022-01-26 MED ORDER — MULTIVITAMIN ADULT PO TABS: 1.0000 | ORAL_TABLET | Freq: Every day | ORAL | Status: AC

## 2022-01-26 MED ORDER — VITAMIN B-12 1000 MCG PO TABS: 1000.0000 ug | ORAL_TABLET | Freq: Every day | ORAL | Status: DC

## 2022-01-26 NOTE — Assessment & Plan Note (Signed)
Chronic, deteriorated with A1c up to 7.8%. declines medication change at this time. Agrees to renew efforts to follow diabetic diet, reassess control at 3 mo f/u. If persistently >goal, discussed injectable insulin vs GLP1RA.

## 2022-01-26 NOTE — Assessment & Plan Note (Signed)
Has not been taking B12 replacement orally.  I recommend he start 1000 mcg daily.

## 2022-01-26 NOTE — Assessment & Plan Note (Signed)
Update renal panel on full dose Farxiga.

## 2022-01-26 NOTE — Assessment & Plan Note (Signed)
Likely neurogenic bladder followed by urology.

## 2022-01-26 NOTE — Assessment & Plan Note (Signed)
BP adequate on lisinopril '10mg'$  bid and farxiga. Discussed ideally tighter BP control, will consider adding low dose amlodipine to regimen if BP remains elevated.

## 2022-01-26 NOTE — Assessment & Plan Note (Signed)
Followed by Movement Disorder clinic at [facility], now on sinemet TID.  Appreciate neurology care. I have placed referral to local speech therapy at [facility] per family preference.

## 2022-01-26 NOTE — Assessment & Plan Note (Signed)
Continues outpatient PT.

## 2022-01-26 NOTE — Patient Instructions (Addendum)
Labs today  Sugar was a bit higher with A1c 7.8% Continue current medicines.  Work on low sugar low carb diabetic diet. Look into mediterranean diet - heart health.  Start over the counter vitamin B12 1074mg daily.       Mediterranean Diet  Why follow it? Research shows. Those who follow the Mediterranean diet have a reduced risk of heart disease  The diet is associated with a reduced incidence of Parkinson's and Alzheimer's diseases People following the diet may have longer life expectancies and lower rates of chronic diseases  The Dietary Guidelines for Americans recommends the Mediterranean diet as an eating plan to promote health and prevent disease  What Is the Mediterranean Diet?  Healthy eating plan based on typical foods and recipes of Mediterranean-style cooking The diet is primarily a plant based diet; these foods should make up a majority of meals   Starches - Plant based foods should make up a majority of meals - They are an important sources of vitamins, minerals, energy, antioxidants, and fiber - Choose whole grains, foods high in fiber and minimally processed items  - Typical grain sources include wheat, oats, barley, corn, brown rice, bulgar, farro, millet, polenta, couscous  - Various types of beans include chickpeas, lentils, fava beans, black beans, white beans   Fruits  Veggies - Large quantities of antioxidant rich fruits & veggies; 6 or more servings  - Vegetables can be eaten raw or lightly drizzled with oil and cooked  - Vegetables common to the traditional Mediterranean Diet include: artichokes, arugula, beets, broccoli, brussel sprouts, cabbage, carrots, celery, collard greens, cucumbers, eggplant, kale, leeks, lemons, lettuce, mushrooms, okra, onions, peas, peppers, potatoes, pumpkin, radishes, rutabaga, shallots, spinach, sweet potatoes, turnips, zucchini - Fruits common to the Mediterranean Diet include: apples, apricots, avocados, cherries, clementines,  dates, figs, grapefruits, grapes, melons, nectarines, oranges, peaches, pears, pomegranates, strawberries, tangerines  Fats - Replace butter and margarine with healthy oils, such as olive oil, canola oil, and tahini  - Limit nuts to no more than a handful a day  - Nuts include walnuts, almonds, pecans, pistachios, pine nuts  - Limit or avoid candied, honey roasted or heavily salted nuts - Olives are central to the [employer]- can be eaten whole or used in a variety of dishes   Meats Protein - Limiting red meat: no more than a few times a month - When eating red meat: choose lean cuts and keep the portion to the size of deck of cards - Eggs: approx. 0 to 4 times a week  - Fish and lean poultry: at least 2 a week  - Healthy protein sources include, chicken, t[place] lean beef, lamb - Increase intake of seafood such as tuna, salmon, trout, mackerel, shrimp, scallops - Avoid or limit high fat processed meats such as sausage and bacon  Dairy - Include moderate amounts of low fat dairy products  - Focus on healthy dairy such as fat free yogurt, skim milk, low or reduced fat cheese - Limit dairy products higher in fat such as whole or 2% milk, cheese, ice cream  Alcohol - Moderate amounts of red wine is ok  - No more than 5 oz daily for women (all ages) and men older than age 69 - No more than 10 oz of wine daily for men younger than 636 Other - Limit sweets and other desserts  - Use herbs and spices instead of salt to flavor foods  - Herbs and spices common  to the traditional Mediterranean Diet include: basil, bay leaves, chives, cloves, cumin, fennel, garlic, lavender, marjoram, mint, oregano, parsley, pepper, rosemary, sage, savory, sumac, tarragon, thyme   It's not just a diet, it's a lifestyle:  The Mediterranean diet includes lifestyle factors typical of those in the region  Foods, drinks and meals are best eaten with others and savored Daily physical activity is important for  overall good health This could be strenuous exercise like running and aerobics This could also be more leisurely activities such as walking, housework, yard-work, or taking the stairs Moderation is the key; a balanced and healthy diet accommodates most foods and drinks Consider portion sizes and frequency of consumption of certain foods   Meal Ideas & Options:  Breakfast:  Whole wheat toast or whole wheat English muffins with peanut butter & hard boiled egg Steel cut oats topped with apples & cinnamon and skim milk  Fresh fruit: banana, strawberries, melon, berries, peaches  Smoothies: strawberries, bananas, greek yogurt, peanut butter Low fat greek yogurt with blueberries and granola  Egg white omelet with spinach and mushrooms Breakfast couscous: whole wheat couscous, apricots, skim milk, cranberries  Sandwiches:  Hummus and grilled vegetables (peppers, zucchini, squash) on whole wheat bread   Grilled chicken on whole wheat pita with lettuce, tomatoes, cucumbers or tzatziki  [place] salad on whole wheat bread: tuna salad made with greek yogurt, olives, red peppers, capers, green onions Garlic rosemary lamb pita: lamb sauted with garlic, rosemary, salt & pepper; add lettuce, cucumber, greek yogurt to pita - flavor with lemon juice and black pepper  Seafood:  Mediterranean grilled salmon, seasoned with garlic, basil, parsley, lemon juice and black pepper Shrimp, lemon, and spinach whole-grain pasta salad made with low fat greek yogurt  Seared scallops with lemon orzo  Seared tuna steaks seasoned salt, pepper, coriander topped with tomato mixture of olives, tomatoes, olive oil, minced garlic, parsley, green onions and cappers  Meats:  Herbed greek chicken salad with kalamata olives, cucumber, feta  [place] peppers stuffed with spinach, bulgur, lean ground beef (or lentils) & topped with feta   Kebabs: skewers of chicken, tomatoes, onions, zucchini, squash  [place] burgers: made with red  onions, mint, dill, lemon juice, feta cheese topped with roasted red peppers Vegetarian Cucumber salad: cucumbers, artichoke hearts, celery, red onion, feta cheese, tossed in olive oil & lemon juice  Hummus and whole grain pita points with a greek salad (lettuce, tomato, feta, olives, cucumbers, red onion) Lentil soup with celery, carrots made with vegetable broth, garlic, salt and pepper  Tabouli salad: parsley, bulgur, mint, scallions, cucumbers, tomato, radishes, lemon juice, olive oil, salt and pepper.

## 2022-01-26 NOTE — Progress Notes (Signed)
Patient ID: Kirk Bennett., male    DOB: 01/31/1951, 71 y.o.   MRN: [id number]  This visit was conducted in person.  BP 140/76   Pulse 90   Temp 97.7 F (36.5 C) (Temporal)   Ht '6\' 2"'$  (1.88 m)   Wt 237 lb 6 oz (107.7 kg)   SpO2 94%   BMI 30.48 kg/m   BP Readings from Last 3 Encounters:  01/26/22 140/76  01/03/22 (!) 156/75  11/16/21 125/79    CC: 3 mo DM f/u visit  Subjective:   HPI: Kirk Bennett. is a 71 y.o. male presenting on 01/26/2022 for Diabetes (Here for 3 mo f/u.  Accompanied by wife, Kirk Bennett. )   Parkinsonism followed by [facility] Dr Laurance Flatten on sinemet. 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 Planned f/u 03/2022. No ST set up yet - will facilitate with local referral to [facility].   Saw urology for likely neurogenic bladder - did not tolerate cystoscopy, likely wouldn't tolerate UDS. Now on Vesicare '5mg'$  + flomax, stopped oxybutynin, planned f/u 03/21/2022. No significant improvement noted so far.   DM - does not regularly check sugars. Compliant with antihyperglycemic regimen which includes: farxiga '10mg'$  daily, amaryl '4mg'$  daily, metformin 500/'1000mg'$  daily. Possible dietary liberties in the past several months. Denies low sugars. Denies paresthesias, blurry vision. Last diabetic eye exam 04/2021. Glucometer brand: accuchek. Last foot exam: 12/2020 - DUE. DSME: previously completed per pt. Lab Results  Component Value Date   HGBA1C 7.8 (A) 01/26/2022   Diabetic Foot Exam - Simple   Simple Foot Form Diabetic Foot exam was performed with the following findings: Yes 01/26/2022  1:39 PM  Visual Inspection No deformities, no ulcerations, no other skin breakdown bilaterally: Yes Sensation Testing Intact to touch and monofilament testing bilaterally: Yes Pulse Check Posterior Tibialis and Dorsalis pulse intact bilaterally: Yes Comments 2+ DP bilaterally    Lab Results  Component Value  Date   MICROALBUR <0.2 09/11/2015         Relevant past medical, surgical, family and social history reviewed and updated as indicated. Interim medical history since our last visit reviewed. Allergies and medications reviewed and updated. Outpatient Medications Prior to Visit  Medication Sig Dispense Refill   atorvastatin (LIPITOR) 20 MG tablet TAKE 1 TABLET BY MOUTH EVERY DAY 90 tablet 3   carbidopa-levodopa (SINEMET IR) 25-100 MG tablet PLEASE SEE ATTACHED FOR DETAILED DIRECTIONS     Cholecalciferol (VITAMIN D3) 1000 units CAPS Take 1,000 Units by mouth daily.     clotrimazole (LOTRIMIN AF) 1 % cream Apply 1 application topically 2 (two) times daily. 113 g 0   dapagliflozin propanediol (FARXIGA) 10 MG TABS tablet Take 1 tablet (10 mg total) by mouth daily. 30 tablet 3   glimepiride (AMARYL) 4 MG tablet TAKE 1 TABLET BY MOUTH EVERY DAY WITH BREAKFAST 90 tablet 0   glucose blood (ONE TOUCH ULTRA TEST) test strip Use to check sugar fasting in the AM and 2 hours after lunch or supper. Dx: E11.21 100 each 3   levETIRAcetam (KEPPRA) 1000 MG tablet Take 1,000 mg by mouth 2 (two) times daily.     lisinopril (ZESTRIL) 10 MG tablet TAKE 1 TABLET BY MOUTH TWICE A DAY 180 tablet 3   metFORMIN (GLUCOPHAGE) 1000 MG tablet Take 0.5 tablets (500 mg total) by mouth daily with breakfast AND 1 tablet (1,000 mg total) daily with supper. 135 tablet 3  solifenacin (VESICARE) 5 MG tablet Take 1 tablet (5 mg total) by mouth daily. 30 tablet 11   tamsulosin (FLOMAX) 0.4 MG CAPS capsule Take 0.4 mg by mouth. Take 30 minutes after the same meal each day     Cyanocobalamin (VITAMIN B12 PO) Take by mouth.     Insulin Pen Needle (B-D ULTRAFINE III SHORT PEN) 31G X 8 MM MISC Use to inject insulin daily 100 each 3   Facility-Administered Medications Prior to Visit  Medication Dose Route Frequency Provider Last Rate Last Admin   lidocaine (XYLOCAINE) 2 % jelly 1 application  1 application. Urethral Once Billey Co, MD         Per HPI unless specifically indicated in ROS section below Review of Systems  Objective:  BP 140/76   Pulse 90   Temp 97.7 F (36.5 C) (Temporal)   Ht '6\' 2"'$  (1.88 m)   Wt 237 lb 6 oz (107.7 kg)   SpO2 94%   BMI 30.48 kg/m   Wt Readings from Last 3 Encounters:  01/26/22 237 lb 6 oz (107.7 kg)  01/03/22 239 lb (108.4 kg)  11/16/21 239 lb (108.4 kg)      Physical Exam Vitals and nursing note reviewed.  Constitutional:      Appearance: Normal appearance. He is not ill-appearing.  Eyes:     Extraocular Movements: Extraocular movements intact.     Conjunctiva/sclera: Conjunctivae normal.     Pupils: Pupils are equal, round, and reactive to light.  Cardiovascular:     Rate and Rhythm: Normal rate and regular rhythm.     Pulses: Normal pulses.     Heart sounds: Normal heart sounds. No murmur heard. Pulmonary:     Effort: Pulmonary effort is normal. No respiratory distress.     Breath sounds: Normal breath sounds. No wheezing, rhonchi or rales.  Musculoskeletal:     Right lower leg: No edema.     Left lower leg: No edema.     Comments: See HPI for foot exam if done  Skin:    General: Skin is warm and dry.     Findings: No rash.  Neurological:     Mental Status: He is alert.  Psychiatric:        Mood and Affect: Mood normal.        Behavior: Behavior normal.      Results for orders placed or performed in visit on 01/26/22  POCT glycosylated hemoglobin (Hb A1C)  Result Value Ref Range   Hemoglobin A1C 7.8 (A) 4.0 - 5.6 %   HbA1c POC (<> result, manual entry)     HbA1c, POC (prediabetic range)     HbA1c, POC (controlled diabetic range)     Lab Results  Component Value Date   CREATININE 1.65 (H) 10/19/2021   BUN 21 10/19/2021   NA 140 10/19/2021   K 4.0 10/19/2021   CL 105 10/19/2021   CO2 26 10/19/2021    Assessment & Plan:   Problem List Items Addressed This Visit     Type 2 diabetes mellitus with diabetic nephropathy ([place]) - Primary     Chronic, deteriorated with A1c up to 7.8%. declines medication change at this time. Agrees to renew efforts to follow diabetic diet, reassess control at 3 mo f/u. If persistently >goal, discussed injectable insulin vs GLP1RA.        Relevant Orders   POCT glycosylated hemoglobin (Hb A1C) (Completed)   Essential hypertension, benign    BP adequate on  lisinopril '10mg'$  bid and farxiga. Discussed ideally tighter BP control, will consider adding low dose amlodipine to regimen if BP remains elevated.        Stage 3 chronic kidney disease ([place])    Update renal panel on full dose Farxiga.       Relevant Orders   Renal function panel   Parathyroid hormone, intact (no Ca)   Low vitamin B12 level    Has not been taking B12 replacement orally.  I recommend he start 1000 mcg daily.       Urge urinary incontinence    Likely neurogenic bladder followed by urology.        Impaired gait and mobility    Continues outpatient PT.        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  Appreciate neurology care. I have placed referral to local speech therapy at [facility] regional per family preference.       Relevant Orders   Ambulatory referral to Speech Therapy     Meds ordered this encounter  Medications   vitamin B-12 (CYANOCOBALAMIN) 1000 MCG tablet    Sig: Take 1 tablet (1,000 mcg total) by mouth daily.   Multiple Vitamin (MULTIVITAMIN ADULT) TABS    Sig: Take 1 tablet by mouth daily.   Orders Placed This Encounter  Procedures   Renal function panel   Parathyroid hormone, intact (no Ca)   Ambulatory referral to Speech Therapy    Referral Priority:   Routine    Referral Type:   Speech Therapy    Referral Reason:   Specialty Services Required    Requested Specialty:   Speech Pathology    Number of Visits Requested:   1   POCT glycosylated hemoglobin (Hb  A1C)     Patient instructions: Labs today  Sugar was a bit higher with A1c 7.8% Continue current medicines.  Work on low sugar low carb diabetic diet. Look into mediterranean diet - heart health.  Start over the counter vitamin B12 1044mg daily.   Follow up plan: Return in about 3 months (around 04/28/2022) for follow up visit.  JRia Bush MD

## 2022-01-27 LAB — PARATHYROID HORMONE, INTACT (NO CA): PTH: 31 pg/mL (ref 16–77)

## 2022-02-01 ENCOUNTER — Telehealth: Payer: Self-pay

## 2022-02-01 NOTE — Telephone Encounter (Signed)
Lvm asking pt to call back.  Need to relay results and schedule 3 mo DM f/u.  Labs/Dr. Synthia Innocent msg: Please notify kidney function remains impaired but overall stable, electrolytes and parathyroid hormone were normal.  Sugar was a little high as discussed at office visit.

## 2022-02-02 NOTE — Telephone Encounter (Signed)
Lvm asking pt to call back.  Need to relay results and schedule 3 mo DM f/u.  Labs/Dr. Synthia Innocent msg: Please notify kidney function remains impaired but overall stable, electrolytes and parathyroid hormone were normal.  Sugar was a little high as discussed at office visit.

## 2022-02-03 NOTE — Telephone Encounter (Signed)
Lvm asking pt to call back.  Need to relay results and schedule 3 mo DM f/u. Mailing a letter.   Labs/Dr. Synthia Innocent msg: Please notify kidney function remains impaired but overall stable, electrolytes and parathyroid hormone were normal.  Sugar was a little high as discussed at office visit.

## 2022-02-08 ENCOUNTER — Ambulatory Visit: Payer: 59 | Attending: Family Medicine | Admitting: Speech Pathology

## 2022-02-08 DIAGNOSIS — R471 Dysarthria and anarthria: Secondary | ICD-10-CM | POA: Insufficient documentation

## 2022-02-08 DIAGNOSIS — R490 Dysphonia: Secondary | ICD-10-CM | POA: Diagnosis present

## 2022-02-08 DIAGNOSIS — R41841 Cognitive communication deficit: Secondary | ICD-10-CM | POA: Insufficient documentation

## 2022-02-08 DIAGNOSIS — G20 Parkinson's disease: Secondary | ICD-10-CM | POA: Insufficient documentation

## 2022-02-09 ENCOUNTER — Encounter: Payer: Self-pay | Admitting: Speech Pathology

## 2022-02-09 NOTE — Therapy (Signed)
OUTPATIENT SPEECH LANGUAGE PATHOLOGY PARKINSON'S EVALUATION   Patient Name: Kirk Bennett. MRN: [id number] DOB:12-15-1950, 71 y.o., male Today's Date: 02/09/2022  PCP: Dr Philip Aspen REFERRING PROVIDER: Dr Philip Aspen   End of Session - 02/09/22 1336     Visit Number 1    Number of Visits 25    Date for SLP Re-Evaluation 04/20/22    Authorization Type Aetna/Aetna NAP    SLP Start Time 1000    SLP Stop Time  1100    SLP Time Calculation (min) 60 min    Activity Tolerance Treatment limited secondary to agitation             Past Medical History:  Diagnosis Date   Cataracts, bilateral 02/2011   and suspected glaucoma, to return for f/u, no diabetic retinopathy   CKD stage 3 due to type 2 diabetes mellitus ([place]) 2015   normal renal US, self referred to Dr Holley Raring   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 Mora Bellman at [facility])   Depression    found by neuro   Elevated PSA 05/27/2015   Serial monitoring ([place])    Glaucoma 2015   suspect   History of hepatitis A    HLD (hyperlipidemia)    HTN (hypertension)    Knee pain    s/p replacement   SVT (supraventricular tachycardia) ([place]) 1996   Vitamin D deficiency 03/02/2015   Well controlled type 2 diabetes mellitus with nephropathy ([place]) 1996   established with Dr. Gabriel Carina endo --> 02/2016 decided to return to PCP for DM care   Past Surgical History:  Procedure Laterality Date   CARDIAC CATHETERIZATION  02/19/2009   No blockages (Dr. Liliane Shi)   COLONOSCOPY  09/16/2005   hyperplastic polyps, rpt due 10 yrs    COLONOSCOPY WITH PROPOFOL N/A 12/11/2015   mult polyps, few TA, rpt 61yr ([place])   COLONOSCOPY WITH PROPOFOL N/A 09/02/2021   TAs, HPs, int hem, distal ileal polyposis - benign, rpt yrs([place], SRichardo Hanks DO)   corrective surgery amblyopia  08/23/1955   Cystectomy or meningioma removal  brain  08/22/1988   (unclear)   Left knee surgery  08/22/1969   Torn ACL   REPLACEMENT TOTAL KNEE  11/20/2009   Left ([facility] Dr. LGarald Balding   Patient Active Problem List   Diagnosis Date Noted   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[place] 01/26/2022   Irregular heart beat 07/19/2021   BPH (benign prostatic hyperplasia) 10/03/2020   Pain due to onychomycosis of toenails of both feet 06/18/2020   Stage 3 chronic kidney disease ([place] 10/23/2019   Type 2 diabetes mellitus with diabetic neuropathy, unspecified ([place] 10/21/2019   Impaired gait and mobility 10/14/2019   Postural dizziness with presyncope 10/13/2019   Ventricular premature complexes 10/13/2019   Urge urinary incontinence 09/26/2019   Depressed mood 09/26/2019   Advanced care planning/counseling discussion 09/22/2018   Weakness of both lower extremities 09/22/2018   History of hepatitis 01/05/2018   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   NAFLD (nonalcoholic fatty liver disease) 09/30/2017   Obesity, Class I, BMI 30.0-34.9 (see actual BMI) 09/18/2017   Polyarthralgia 02/15/2017   OSA (obstructive sleep apnea) 02/15/2017   Partial epilepsy with impairment of consciousness ([place] 01/29/2016   Low vitamin B12 level 05/27/2015   Elevated PSA 05/27/2015   Vitamin D deficiency 03/02/2015   Other long term (current) drug therapy 03/12/2012   Encounter for general adult  medical examination with abnormal findings 04/26/2011   TOTAL KNEE REPLACEMENT, LEFT, HX OF 04/07/2010   Type 2 diabetes mellitus with diabetic nephropathy ([place]) 03/30/2010   Hyperlipidemia associated with type 2 diabetes mellitus ([place]) 03/30/2010   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 ([place]) 03/30/2010   Essential hypertension, benign 03/30/2010    ONSET DATE: 01/26/2022 (referral date); initial Parkinson's dx 05/2021  REFERRING DIAG: G20 (ICD-10-CM) - 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[place])   THERAPY DIAG:  Cognitive communication  deficit  Dysphonia  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   PAIN:  Are you having pain? No  FALLS: Has patient fallen in last 6 months?  No  LIVING ENVIRONMENT: Lives with: lives with their spouse Lives in: House/apartment  PLOF:  Level of assistance: Needed assistance with ADLs, Needed assistance with IADLS Employment: Retired  PATIENT GOALS none stated d/t lack of insight into deficits  OBJECTIVE:   DIAGNOSTIC FINDINGS: hypophonia, cognition impairment  COGNITION: Overall cognitive status: Impaired Areas of impairment:  Attention: Impaired: Selective Memory: Impaired: Immediate Working Short term Programmer, systems Awareness: Impaired: Intellectual and Error awareness: 0 % Executive function: Impaired: Initiation, Problem solving, Planning, Error awareness, Self-correction, and Slow processing Behavior: Poor frustration tolerance Functional deficits: Pt unaware of the above deficits, is dependent on his wife for recall of medicines, medication management, difficulty initiating any daily tasks.   MOTOR SPEECH: Overall motor speech: impaired Level of impairment: Word Respiration: clavicular breathing and speaking on residual capacity Phonation: breathy and low vocal intensity Resonance: WFL Articulation: Impaired: word Intelligibility: Intelligibility reduced Motor planning: Impaired: unaware and consistent Motor speech errors: unaware and  consistent Effective technique: increased vocal intensity  ORAL MOTOR EXAMINATION Overall status: WFL    OBJECTIVE ASSESSMENT:  Measured when a sound level meter was placed 30 cm away from pt's mouth, 20 minutes of moderate to complex conversational speech was reduced today, at average 65dB (WNL= average 70-72dB) with range of 5dB. Overall speech intelligibility for this listener in a quiet environment was affected, at approximately 100%. Production of loud /a/ averaged 76dB (range of 77 to 77) and consistent max cues needed for loudness. In addition pt unable to alter pitch and demonstrated diminished prosody.   Pt then rated effort level at 1/10 for production of loud /a/ (10=maximal effort). Pt would benefit from skilled ST in order to improve speech intelligibility and pt's QOL.  In addition, pt presents with moderate cognitive impairments as reflected by his score on the SLUMS.  The "[place] Mental Status" (SLUMS) Examination was administered. Pt scored 14/30, raising concern for the presence of a neurocognitive disorder. Further testing would be beneficial, as deficits of attention, memory, oriantion, problem solving, and executive functions identified today may negatively impact pt safety with independent living.   SLUMS Examination Orientation  3/3  Numeric Problem Solving  1/3  Memory  1/5  Attention 0/2  Thought Organization 1/3  Clock Drawing 0/4  Visuospatial Skills               2/2  Short Story Recall  6/8  Total  14/30     Scoring  High School Education  Less than High School Education   Normal  27-30 25-30  Mild Neurocognitive Disorder 21-26 20-24  Dementia  1-20 1-19      Pt does/does not report difficulty with swallowing warranting further evaluation      PATIENT REPORTED OUTCOME MEASURES (PROM): Pt refused     PATIENT EDUCATION: Education details: results of this evaluation, cognitive and speech impairments, ST POC Person educated:  Patient and Spouse Education method: Explanation, Demonstration, and Verbal cues Education comprehension: needs further education      GOALS: Goals reviewed with patient? Yes  SHORT TERM GOALS: Target date: 10 therapy sessions   The patient produce "ah", High/Lows, and Functional Phrases at average loudness >/= 80 dB and with loud, good quality voice with moderate cues.   Baseline: unable to produce with total cues Goal status: INITIAL  2.  The patient will answer personal information using a memory book given moderate verbal cues. Baseline: unable to recall important events/medications/address Goal status: INITIAL   LONG TERM GOALS: Target date: 04/06/2022   The patient will participate in 5-8 minutes conversation, maintaining average loudness of 75 dB and loud, good quality voice with min cues.   Baseline: answers mostly yes/no/I don't know - uninterested in conversation Goal status: INITIAL  2.  The patient will complete a daily journal given occasional cues. Baseline: new goal, unable to recall daily events Goal status: INITIAL  ASSESSMENT:  CLINICAL IMPRESSION: Patient is a 71 y.o. male who was seen today for evaluation of hypophonia and cognitive communication deficits. Pt presents with moderately decreased speech intelligibility d/t mumbled, low intensity speech resulting in ~ 50% speech intelligibility at the sentence level in a quiet environment. In addition he presents with moderate to severe cognitive impairment that is c/b severe deficits in memory, semi-complex problem solving, moderate deficits in selective attention, lack of task or conversation initiation, impaired pragmatic ability and poor task tolerance d/t severely impaired insight into deficits.   OBJECTIVE IMPAIRMENTS  Objective impairments include attention, memory, awareness, executive functioning, and voice disorder. These impairments are limiting patient from managing medications, managing appointments,  household responsibilities, ADLs/IADLs, and effectively communicating at home and in community.Factors affecting potential to achieve goals and functional outcome are cooperation/participation level. Patient will benefit from skilled SLP services to address above impairments and improve overall function.  REHAB POTENTIAL: Fair - pt with no insight into deficits, appears very disinterested  PLAN: SLP FREQUENCY: 2x/week  SLP DURATION: 8 weeks  PLANNED INTERVENTIONS: Internal/external aids, Functional tasks, SLP instruction and feedback, and Patient/family education   Jhoanna Heyde B. Rutherford Nail, M.S., CCC-SLP, Mining engineer Certified Brain Injury [place]  [place] Office [phone] Ascom [phone] Fax [phone]

## 2022-02-14 ENCOUNTER — Ambulatory Visit: Payer: 59 | Admitting: Urology

## 2022-02-15 ENCOUNTER — Ambulatory Visit: Payer: 59 | Admitting: Speech Pathology

## 2022-02-15 ENCOUNTER — Telehealth: Payer: Self-pay | Admitting: Speech Pathology

## 2022-02-15 DIAGNOSIS — G20 Parkinson's disease: Secondary | ICD-10-CM

## 2022-02-15 DIAGNOSIS — R490 Dysphonia: Secondary | ICD-10-CM

## 2022-02-15 DIAGNOSIS — R471 Dysarthria and anarthria: Secondary | ICD-10-CM

## 2022-02-15 DIAGNOSIS — R41841 Cognitive communication deficit: Secondary | ICD-10-CM | POA: Diagnosis not present

## 2022-02-15 NOTE — Telephone Encounter (Signed)
Pt was a "no show" for today's speech therapy session at 11:45am. I called and spoke with patient's wife who stated they forgot.   Offered to reschedule for 2pm this afternoon. They confirmed that they will be here at 2pm for his session.   Carroll Ranney B. Dreama Saa, M.S., CCC-SLP, Tree surgeon Certified Brain Injury Specialist [facility]  [facility] Office [phone] Ascom [phone] Fax [phone]

## 2022-02-16 ENCOUNTER — Encounter: Payer: 59 | Admitting: Speech Pathology

## 2022-02-16 NOTE — Therapy (Signed)
OUTPATIENT SPEECH LANGUAGE PATHOLOGY  DISCHARGE SUMMARY  Patient Name: Kirk A Rix Jr. MRN: [id number] DOB:08/21/1951, 71 y.o., male Today's Date: 02/17/2022  PCP: Dr Javier Guiterrez REFERRING PROVIDER: Dr Javier Guiterrez   End of Session - 02/16/22 2153     Visit Number 2    Number of Visits 25    Date for SLP Re-Evaluation 04/20/22    Authorization Type Aetna/Aetna NAP    SLP Start Time 1410    SLP Stop Time  1500    SLP Time Calculation (min) 50 min    Activity Tolerance Treatment limited secondary to agitation             Past Medical History:  Diagnosis Date   Cataracts, bilateral 02/2011   and suspected glaucoma, to return for f/u, no diabetic retinopathy   CKD stage 3 due to type 2 diabetes mellitus (HCC) 2015   normal renal US, self referred to Dr Lateef   Complex partial seizures (HCC) 1990   from surgery for R temporal arachnoid cyst s/p drainage, possible continued sz so changed to lamotrigine (Dr. 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 Solum endo --> 02/2016 decided to return to PCP for DM care   Past Surgical History:  Procedure Laterality Date   CARDIAC CATHETERIZATION  02/19/2009   No blockages ([place]   COLONOSCOPY  09/16/2005   hyperplastic polyps, rpt due 10 yrs    COLONOSCOPY WITH PROPOFOL N/A 12/11/2015   mult polyps, few TA, rpt 3yrs ([place])   COLONOSCOPY WITH PROPOFOL N/A 09/02/2021   TAs, HPs, int hem, distal ileal polyposis - benign, rpt yrs(Russo, Steven Michael, DO)   corrective surgery amblyopia  08/23/1955   Cystectomy or meningioma removal brain   08/22/1988   (unclear)   Left knee surgery  08/22/1969   Torn ACL   REPLACEMENT TOTAL KNEE  11/20/2009   Left ([facility] Dr. 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   Irregular heart beat 07/19/2021   BPH (benign prostatic hyperplasia) 10/03/2020   Pain due to onychomycosis of toenails of both feet 06/18/2020   Stage 3 chronic kidney disease (HCC) 10/23/2019   Type 2 diabetes mellitus with diabetic neuropathy, unspecified (HCC) 10/21/2019   Impaired gait and mobility 10/14/2019   Postural dizziness with presyncope 10/13/2019   Ventricular premature complexes 10/13/2019   Urge urinary incontinence 09/26/2019   Depressed mood 09/26/2019   Advanced care planning/counseling discussion 09/22/2018   Weakness of both lower extremities 09/22/2018   History of hepatitis 01/05/2018   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   NAFLD (nonalcoholic fatty liver disease) 09/30/2017   Obesity, Class I, BMI 30.0-34.9 (see actual BMI) 09/18/2017   Polyarthralgia 02/15/2017   OSA (obstructive sleep apnea) 02/15/2017   Partial epilepsy with impairment of consciousness (HCC) 01/29/2016   Low vitamin B12 level 05/27/2015   Elevated PSA 05/27/2015   Vitamin D deficiency 03/02/2015   Other long term (current) drug therapy 03/12/2012   Encounter for general adult   medical examination with abnormal findings 04/26/2011   TOTAL KNEE REPLACEMENT, LEFT, HX OF 04/07/2010   Type 2 diabetes mellitus with diabetic nephropathy ([place]) 03/30/2010   Hyperlipidemia associated with type 2 diabetes mellitus ([place]) 03/30/2010   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 ([place]) 03/30/2010   Essential hypertension, benign 03/30/2010    ONSET DATE: 01/26/2022 (referral date); initial Parkinson's dx 05/2021  REFERRING DIAG: G20 (ICD-10-CM) - 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[place])   THERAPY DIAG:  Cognitive communication deficit  Dysarthria  and anarthria  Dysphonia  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[place])  Rationale for Evaluation and Treatment Rehabilitation  SUBJECTIVE:   SUBJECTIVE STATEMENT: "There is nothing wrong with either one" referring to his speech and memory Pt accompanied by: significant other   OBJECTIVE:   Today's treatment: Skilled treatment focused on pt's cognitive communication goals. SLP facilitated session by providing maximal multimodal assistance to achieve 1 sustained "ah" at 82 dB d/t pt disinterested. Pt also required maximal assistance to recall the names of his grandchildren who attended his anniversary cook-out and he was incorrect with recalling how many of his children came to the party. Pt appeared unaware of the discrepancies.   PATIENT EDUCATION: Education details: results of this evaluation, cognitive and speech impairments, ST POC Person educated: Patient and Spouse Education method: Explanation, Demonstration, and Verbal cues Education comprehension: needs further education      GOALS: Goals reviewed with patient? Yes  SHORT TERM GOALS: Target date: 10 therapy sessions   The patient produce "ah", High/Lows, and Functional Phrases at average loudness >/= 80 dB and with loud, good quality voice with moderate cues.   Baseline: unable to produce with total cues Goal status: NOT MET  2.  The patient will answer personal information using a memory book given moderate verbal cues. Baseline: unable to recall important events/medications/address Goal status: NOT MET   LONG TERM GOALS: Target date: 04/06/2022   The patient will participate in 5-8 minutes conversation, maintaining average loudness of 75 dB and loud, good quality voice with min cues.   Baseline: answers mostly yes/no/I don't know - uninterested in conversation Goal status: NOT MET  2.  The patient will complete a daily journal given occasional cues. Baseline: new goal, unable to recall daily events Goal status: NOT  MET  ASSESSMENT:  CLINICAL IMPRESSION: Pt continues to present with severe memory and speech impairments that are likely multi-factoral in nature - hx brain surgery, seizures, Parkinson's Disease. While pt could certainly benefit from skilled ST intervention, he voices that he has "no desire to work on it, because nothing is wrong with it." Despite attempts by this Probation officer and pt's wife to build awareness of deficits including audio feedback of his speech, he no longer wants to proceed with ST services as "there is nothing wrong with me."   OBJECTIVE IMPAIRMENTS  Objective impairments include attention, memory, awareness, executive functioning, and voice disorder. These impairments are limiting patient from managing medications, managing appointments, household responsibilities, ADLs/IADLs, and effectively communicating at home and in community.Factors affecting potential to achieve goals and functional outcome are cooperation/participation level.   REHAB POTENTIAL: POOR - pt not interested in ST services at this time.   PLAN: DISCHARGE PATIENT FROM SERVICES  Lissette Schenk B. Rutherford Nail, M.S., CCC-SLP, Mining engineer Certified Brain Injury [place]  [place] Office [phone] Ascom [phone] Fax [phone]

## 2022-02-17 ENCOUNTER — Encounter: Payer: 59 | Admitting: Speech Pathology

## 2022-02-18 ENCOUNTER — Encounter: Payer: 59 | Admitting: Speech Pathology

## 2022-02-24 ENCOUNTER — Encounter: Payer: 59 | Admitting: Speech Pathology

## 2022-02-28 ENCOUNTER — Encounter: Payer: 59 | Admitting: Speech Pathology

## 2022-03-01 ENCOUNTER — Encounter: Payer: 59 | Admitting: Speech Pathology

## 2022-03-02 ENCOUNTER — Encounter: Payer: 59 | Admitting: Speech Pathology

## 2022-03-03 ENCOUNTER — Encounter: Payer: 59 | Admitting: Speech Pathology

## 2022-03-07 ENCOUNTER — Ambulatory Visit: Payer: 59 | Admitting: Urology

## 2022-03-07 ENCOUNTER — Encounter: Payer: Self-pay | Admitting: Urology

## 2022-03-07 ENCOUNTER — Encounter: Payer: 59 | Admitting: Speech Pathology

## 2022-03-07 VITALS — BP 126/70 | HR 91 | Ht 74.0 in | Wt 240.0 lb

## 2022-03-07 DIAGNOSIS — N3281 Overactive bladder: Secondary | ICD-10-CM | POA: Diagnosis not present

## 2022-03-07 DIAGNOSIS — N3941 Urge incontinence: Secondary | ICD-10-CM

## 2022-03-07 DIAGNOSIS — N3946 Mixed incontinence: Secondary | ICD-10-CM

## 2022-03-07 MED ORDER — TAMSULOSIN HCL 0.4 MG PO CAPS: 0.4000 mg | ORAL_CAPSULE | Freq: Every day | ORAL | 11 refills | Status: DC

## 2022-03-07 NOTE — Progress Notes (Signed)
03/07/2022 11:13 AM   Kirk Bennett. 11-21-1950 [id number]  Referring provider: Ria Bush, MD [address] [place],  [place] [address]  Chief Complaint  Patient presents with   Over Active Bladder    6wk follow up    HPI: SN: Overactive bladder with urgency incontinence on oxybutynin.  2 positive urine cultures.  He may have colonization.  Large prostate and high bladder neck August 22 cystoscopy.  He has Parkinson's and is not a good historian.  Myrbetriq may have helped in the past but was too expensive.  It was not felt that he would tolerate urodynamics since he did not tolerate the cystoscopy well.  Was given Gemtesa with no benefit and offered and failed percutaneous tibial nerve stimulation.  When seen by nurse practitioner recently residual was 177 mL.  He did fail Flomax.  Subsequent residual was 98 mL.  Last visit discussed condom catheter.   According to the medical record patient on Flomax and oxybutynin but his wife was not sure if he was on anything at this stage.    He soaks 3 pull-ups a day.  He can walk with a cane.  He has moderately severe bedwetting and can leak without awareness.  He has a poor flow.  He hesitates.  He strains.  He does not think he could urinate if he did not strain.  He feels empty.   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  Apparently did not tolerate cystoscopy well.  His flow symptoms also could be due to his neurogenic bladder with Parkinson's.  In my opinion a prostate outlet procedure would not likely be in his best interest in his incontinence certainly could worsen.  I do not think he should have Botox.  I am not certain if urinary prophylaxis would help any of his symptoms with 2 recent positive cultures.  He said he can lay on his stomach and we could do a peripheral nerve evaluation recognizing success rates may be lower.  I mention this today but like to try 1 more antimuscarinic.  Reassess on Vesicare 5 mg 30x11.  Stop  oxybutynin.  Try to get a urine culture again.  Stay on the Flomax  Today Kirk Bennett has the bedwetting and urge incontinence.  Still on Flomax.  Clinically not infected  Recognizing patient complexity I went through InterStim with full template.  I mentioned and did not think he should have Botox.    PMH: Past Medical History:  Diagnosis Date   Cataracts, bilateral 02/2011   and suspected glaucoma, to return for f/u, no diabetic retinopathy   CKD stage 3 due to type 2 diabetes mellitus ([place]) 2015   normal renal US, self referred to Dr Holley Raring   Complex partial seizures ([place]) 1990   from surgery for R temporal arachnoid cyst s/p drainage, possible continued sz so changed to lamotrigine (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 Gabriel Carina endo --> 02/2016 decided to return to PCP for DM care    Surgical History: Past Surgical  History:  Procedure Laterality Date   CARDIAC CATHETERIZATION  02/19/2009   No blockages (Dr. Liliane Shi)   COLONOSCOPY  09/16/2005   hyperplastic polyps, rpt due 10 yrs    COLONOSCOPY WITH PROPOFOL N/A 12/11/2015   mult polyps, few TA, rpt 35yr ([place])   COLONOSCOPY WITH PROPOFOL N/A 09/02/2021   TAs, HPs, int hem, distal ileal polyposis - benign, rpt yrs([place], SRichardo Hanks DO)   corrective surgery amblyopia  08/23/1955   Cystectomy or meningioma removal brain  08/22/1988   (unclear)   Left knee surgery  08/22/1969   Torn ACL   REPLACEMENT TOTAL KNEE  11/20/2009   Left ([facility] Dr. LGarald Balding    Home Medications:  Allergies as of 03/07/2022       Reactions   Bee Venom Shortness Of  Breath   Demerol [meperidine Hcl] Nausea And Vomiting        Medication List        Accurate as of March 07, 2022 11:13 AM. If you have any questions, ask your nurse or doctor.          atorvastatin 20 MG tablet Commonly known as: LIPITOR TAKE 1 TABLET BY MOUTH EVERY DAY   carbidopa-levodopa 25-100 MG tablet Commonly known as: SINEMET IR PLEASE SEE ATTACHED FOR DETAILED DIRECTIONS   clotrimazole 1 % cream Commonly known as: Lotrimin AF Apply 1 application topically 2 (two) times daily.   dapagliflozin propanediol 10 MG Tabs tablet Commonly known as: Farxiga Take 1 tablet (10 mg total) by mouth daily.   glimepiride 4 MG tablet Commonly known as: AMARYL TAKE 1 TABLET BY MOUTH EVERY DAY WITH BREAKFAST   glucose blood test strip Commonly known as: ONE TOUCH ULTRA TEST Use to check sugar fasting in the AM and 2 hours after lunch or supper. Dx: E11.21   levETIRAcetam 1000 MG tablet Commonly known as: KEPPRA Take 1,000 mg by mouth 2 (two) times daily.   lisinopril 10 MG tablet Commonly known as: ZESTRIL TAKE 1 TABLET BY MOUTH TWICE A DAY   metFORMIN 1000 MG tablet Commonly known as: GLUCOPHAGE Take 0.5 tablets (500 mg total) by mouth daily with breakfast AND 1 tablet (1,000 mg total) daily with supper.   Multivitamin Adult Tabs Take 1 tablet by mouth daily.   solifenacin 5 MG tablet Commonly known as: VESICARE Take 1 tablet (5 mg total) by mouth daily.   tamsulosin 0.4 MG Caps capsule Commonly known as: FLOMAX Take 0.4 mg by mouth. Take 30 minutes after the same meal each day   vitamin B-12 1000 MCG tablet Commonly known as: CYANOCOBALAMIN Take 1 tablet (1,000 mcg total) by mouth daily.   Vitamin D3 25 MCG (1000 UT) Caps Take 1,000 Units by mouth daily.        Allergies:  Allergies  Allergen Reactions   Bee Venom Shortness Of Breath   Demerol [Meperidine Hcl] Nausea And Vomiting    Family History: Family History  Problem Relation Age of Onset    Heart failure Father    Cancer Mother        Lung, smoker   Aortic dissection Mother        Aortic rupture   Mental illness Sister        651years old   Diabetes Maternal Aunt    Diabetes Cousin    Cancer Cousin        Colon age 69  CAD Neg Hx     Social History:  reports that he has never smoked.  He has never used smokeless tobacco. He reports that he does not drink alcohol and does not use drugs.  ROS:                                        Physical Exam: There were no vitals taken for this visit.  Constitutional:  Alert and oriented, No acute distress. HEENT: [place] AT, moist mucus membranes.  Trachea midline, no masses.   Laboratory Data: Lab Results  Component Value Date   WBC 6.4 10/19/2021   HGB 14.6 10/19/2021   HCT 44.9 10/19/2021   MCV 85.4 10/19/2021   PLT 184.0 10/19/2021    Lab Results  Component Value Date   CREATININE 1.50 01/26/2022    Lab Results  Component Value Date   PSA 0.71 10/19/2021   PSA 1.21 09/25/2020   PSA 1.6 09/20/2019    No results found for: "TESTOSTERONE"  Lab Results  Component Value Date   HGBA1C 7.8 (A) 01/26/2022    Urinalysis    Component Value Date/Time   COLORURINE YELLOW 12/01/2020 1254   APPEARANCEUR CLOUDY (A) 12/01/2020 1254   LABSPEC 1.018 12/01/2020 1254   PHURINE 5.5 12/01/2020 1254   GLUCOSEU 3+ (A) 12/01/2020 1254   HGBUR NEGATIVE 12/01/2020 1254   BILIRUBINUR negative 10/06/2020 1144   KETONESUR NEGATIVE 12/01/2020 1254   PROTEINUR NEGATIVE 12/01/2020 1254   UROBILINOGEN 0.2 10/06/2020 1144   NITRITE NEGATIVE 12/01/2020 1254   LEUKOCYTESUR NEGATIVE 12/01/2020 1254    Pertinent Imaging:   Assessment & Plan: Patient does not want InterStim and I can understand his decision.  He Kirk Bennett stop the Vesicare.  I Kirk Bennett see him in a year on Flomax or he Kirk Bennett follow-up with primary care for Flomax.  We have reached the end of the treatment pathway  There are no diagnoses linked to  this encounter.  No follow-ups on file.  Reece Packer, MD  [facility] [address] [place],  [address] [phone]

## 2022-03-08 ENCOUNTER — Encounter: Payer: 59 | Admitting: Speech Pathology

## 2022-03-09 ENCOUNTER — Encounter: Payer: 59 | Admitting: Speech Pathology

## 2022-03-10 ENCOUNTER — Encounter: Payer: 59 | Admitting: Speech Pathology

## 2022-03-11 ENCOUNTER — Encounter: Payer: 59 | Admitting: Speech Pathology

## 2022-03-14 ENCOUNTER — Encounter: Payer: 59 | Admitting: Speech Pathology

## 2022-03-15 ENCOUNTER — Encounter: Payer: 59 | Admitting: Speech Pathology

## 2022-03-16 ENCOUNTER — Encounter: Payer: 59 | Admitting: Speech Pathology

## 2022-03-17 ENCOUNTER — Encounter: Payer: 59 | Admitting: Speech Pathology

## 2022-03-21 ENCOUNTER — Telehealth: Payer: Self-pay

## 2022-03-21 ENCOUNTER — Encounter: Payer: 59 | Admitting: Speech Pathology

## 2022-03-21 ENCOUNTER — Ambulatory Visit: Payer: 59 | Admitting: Urology

## 2022-03-21 NOTE — Telephone Encounter (Signed)
Patient's wife returned phone call about lab results.

## 2022-03-21 NOTE — Telephone Encounter (Signed)
Results mailed to pt on 02/03/22 after several attempts to contact pt with no response. (See 02/01/22 phn note.)

## 2022-03-22 ENCOUNTER — Encounter: Payer: 59 | Admitting: Speech Pathology

## 2022-03-24 ENCOUNTER — Encounter: Payer: 59 | Admitting: Speech Pathology

## 2022-03-28 ENCOUNTER — Encounter: Payer: 59 | Admitting: Speech Pathology

## 2022-03-29 ENCOUNTER — Encounter: Payer: 59 | Admitting: Speech Pathology

## 2022-03-30 ENCOUNTER — Encounter: Payer: 59 | Admitting: Speech Pathology

## 2022-03-31 ENCOUNTER — Encounter: Payer: 59 | Admitting: Speech Pathology

## 2022-04-04 ENCOUNTER — Encounter: Payer: 59 | Admitting: Speech Pathology

## 2022-04-05 ENCOUNTER — Encounter: Payer: 59 | Admitting: Speech Pathology

## 2022-04-06 ENCOUNTER — Encounter: Payer: 59 | Admitting: Speech Pathology

## 2022-04-07 ENCOUNTER — Encounter: Payer: 59 | Admitting: Speech Pathology

## 2022-04-08 ENCOUNTER — Encounter: Payer: 59 | Admitting: Speech Pathology

## 2022-04-11 ENCOUNTER — Encounter: Payer: 59 | Admitting: Speech Pathology

## 2022-04-12 ENCOUNTER — Encounter: Payer: 59 | Admitting: Speech Pathology

## 2022-04-13 ENCOUNTER — Encounter: Payer: 59 | Admitting: Speech Pathology

## 2022-04-14 ENCOUNTER — Encounter: Payer: 59 | Admitting: Speech Pathology

## 2022-04-18 ENCOUNTER — Encounter: Payer: 59 | Admitting: Speech Pathology

## 2022-04-19 ENCOUNTER — Encounter: Payer: 59 | Admitting: Speech Pathology

## 2022-04-20 ENCOUNTER — Encounter: Payer: 59 | Admitting: Speech Pathology

## 2022-04-21 ENCOUNTER — Encounter: Payer: 59 | Admitting: Speech Pathology

## 2022-04-26 ENCOUNTER — Encounter: Payer: 59 | Admitting: Speech Pathology

## 2022-04-27 ENCOUNTER — Encounter: Payer: 59 | Admitting: Speech Pathology

## 2022-04-28 ENCOUNTER — Encounter: Payer: 59 | Admitting: Speech Pathology

## 2022-04-29 ENCOUNTER — Ambulatory Visit: Payer: 59 | Admitting: Family Medicine

## 2022-04-29 ENCOUNTER — Encounter: Payer: Self-pay | Admitting: Family Medicine

## 2022-04-29 VITALS — BP 122/64 | HR 91 | Temp 97.7°F | Ht 74.0 in | Wt 236.0 lb

## 2022-04-29 DIAGNOSIS — I10 Essential (primary) hypertension: Secondary | ICD-10-CM

## 2022-04-29 DIAGNOSIS — N1831 Chronic kidney disease, stage 3a: Secondary | ICD-10-CM

## 2022-04-29 DIAGNOSIS — E1121 Type 2 diabetes mellitus with diabetic nephropathy: Secondary | ICD-10-CM

## 2022-04-29 DIAGNOSIS — E1140 Type 2 diabetes mellitus with diabetic neuropathy, unspecified: Secondary | ICD-10-CM

## 2022-04-29 DIAGNOSIS — N3941 Urge incontinence: Secondary | ICD-10-CM

## 2022-04-29 DIAGNOSIS — G20 Parkinson's disease: Secondary | ICD-10-CM

## 2022-04-29 LAB — POCT GLYCOSYLATED HEMOGLOBIN (HGB A1C): Hemoglobin A1C: 9.2 % — AB (ref 4.0–5.6)

## 2022-04-29 MED ORDER — GLIMEPIRIDE 4 MG PO TABS: 4.0000 mg | ORAL_TABLET | Freq: Every day | ORAL | 1 refills | Status: DC

## 2022-04-29 MED ORDER — DAPAGLIFLOZIN PROPANEDIOL 10 MG PO TABS: 10.0000 mg | ORAL_TABLET | Freq: Every day | ORAL | 6 refills | Status: DC

## 2022-04-29 MED ORDER — OZEMPIC (0.25 OR 0.5 MG/DOSE) 2 MG/3ML ~~LOC~~ SOPN: PEN_INJECTOR | SUBCUTANEOUS | 6 refills | Status: AC

## 2022-04-29 NOTE — Patient Instructions (Addendum)
Start ozempic 0.41mg weekly for 2 weeks then increase to 05.mg weekly.  Watch for nausea, constipation, upper abdominal pain and let uKoreaknow if this happens. Drop metformin to 1/2 tablet in the morning and 1 tablet at night time.  Return in 3 months for diabetes follow up visit

## 2022-04-29 NOTE — Progress Notes (Unsigned)
Patient ID: Kirk Bennett., male    DOB: May 09, 1951, 71 y.o.   MRN: [id number]  This visit was conducted in person.  BP 122/64   Pulse 91   Temp 97.7 F (36.5 C) (Temporal)   Ht '6\' 2"'$  (1.88 m)   Wt 236 lb (107 kg)   SpO2 94%   BMI 30.30 kg/m    CC: 11modiabetes f/u visit  Subjective:   HPI: Kirk Bennett is a 71y.o. male presenting on 04/29/2022 for Diabetes (Here for 3 mo f/u. Pt accompanied by wife, Kirk Bennett. )   [facility]SP [facility] Sinemet [facility]. Sees PT, s/p ST eval - he declined return due to lack of awareness of voice issues. Next visit 3 mo.   Sees new seizure doctor on Monday.   Saw urology for neurogenic bladder - flomax 0.'4mg'$  nightly. He declined InterStim treatment. He stopped vesicare/oxybutynin due to dizziness, continues flomax. No further intervention option.   HTN - bp well controlled today on lisinopril '10mg'$  bid.   DM - does not regularly check sugars. Compliant with antihyperglycemic regimen which includes: farxiga '10mg'$  daily, amaryl '4mg'$  daily, metformin 500/'1000mg'$  daily. No fmhx thyroid disease. Denies low sugars or hypoglycemic symptoms. Denies paresthesias, blurry vision. Last diabetic eye exam 04/2021 - appt next week. Glucometer brand: accuchek. Last foot exam: 01/2022. DSME: previously completed. Lab Results  Component Value Date   HGBA1C 9.2 (A) 04/29/2022   Diabetic Foot Exam - Simple   No data filed    Lab Results  Component Value Date   MICROALBUR <0.2 09/11/2015        Relevant past medical, surgical, family and social history reviewed and updated as indicated. Interim medical history since our last visit reviewed. Allergies and medications reviewed and updated. Outpatient Medications Prior to Visit  Medication Sig Dispense Refill   atorvastatin (LIPITOR) 20 MG tablet TAKE 1 TABLET BY MOUTH EVERY DAY 90 tablet 3   carbidopa-levodopa (SINEMET IR) 25-100 MG tablet PLEASE SEE  ATTACHED FOR DETAILED DIRECTIONS     Cholecalciferol (VITAMIN D3) 1000 units CAPS Take 1,000 Units by mouth daily.     clotrimazole (LOTRIMIN AF) 1 % cream Apply 1 application topically 2 (two) times daily. 113 g 0   dapagliflozin propanediol (FARXIGA) 10 MG TABS tablet Take 1 tablet (10 mg total) by mouth daily. 30 tablet 3   glimepiride (AMARYL) 4 MG tablet TAKE 1 TABLET BY MOUTH EVERY DAY WITH BREAKFAST 90 tablet 0   glucose blood (ONE TOUCH ULTRA TEST) test strip Use to check sugar fasting in the AM and 2 hours after lunch or supper. Dx: E11.21 100 each 3   levETIRAcetam (KEPPRA) 1000 MG tablet Take 1,000 mg by mouth 2 (two) times daily.     lisinopril (ZESTRIL) 10 MG tablet TAKE 1 TABLET BY MOUTH TWICE A DAY 180 tablet 3   metFORMIN (GLUCOPHAGE) 1000 MG tablet Take 0.5 tablets (500 mg total) by mouth daily with breakfast AND 1 tablet (1,000 mg total) daily with supper. 135 tablet 3   Multiple Vitamin (MULTIVITAMIN ADULT) TABS Take 1 tablet by mouth daily.     tamsulosin (FLOMAX) 0.4 MG CAPS capsule Take 1 capsule (0.4 mg total) by mouth daily. Take 30 minutes after the same meal each day 30 capsule 11   vitamin B-12 (CYANOCOBALAMIN) 1000 MCG tablet Take 1 tablet (1,000 mcg total) by mouth daily.     Facility-Administered Medications Prior to  Visit  Medication Dose Route Frequency Provider Last Rate Last Admin   lidocaine (XYLOCAINE) 2 % jelly 1 application  1 application  Urethral Once Billey Co, MD         Per HPI unless specifically indicated in ROS section below Review of Systems  Objective:  BP 122/64   Pulse 91   Temp 97.7 F (36.5 C) (Temporal)   Ht '6\' 2"'$  (1.88 m)   Wt 236 lb (107 kg)   SpO2 94%   BMI 30.30 kg/m   Wt Readings from Last 3 Encounters:  04/29/22 236 lb (107 kg)  03/07/22 240 lb (108.9 kg)  01/26/22 237 lb 6 oz (107.7 kg)      Physical Exam Vitals and nursing note reviewed.  Constitutional:      Appearance: Normal appearance. He is not  ill-appearing.     Comments: Sitting in wheelchair  Eyes:     Extraocular Movements: Extraocular movements intact.     Conjunctiva/sclera: Conjunctivae normal.     Pupils: Pupils are equal, round, and reactive to light.  Cardiovascular:     Rate and Rhythm: Normal rate and regular rhythm.     Pulses: Normal pulses.     Heart sounds: Normal heart sounds. No murmur heard. Pulmonary:     Effort: Pulmonary effort is normal. No respiratory distress.     Breath sounds: Normal breath sounds. No wheezing, rhonchi or rales.  Abdominal:     General: Bowel sounds are normal.     Palpations: Abdomen is soft. There is no mass.     Tenderness: There is no abdominal tenderness. There is no guarding or rebound.     Hernia: No hernia is present.  Musculoskeletal:     Right lower leg: No edema.     Left lower leg: No edema.     Comments: See HPI for foot exam if done  Skin:    General: Skin is warm and dry.     Findings: No rash.  Neurological:     Mental Status: He is alert.  Psychiatric:        Mood and Affect: Mood normal.        Behavior: Behavior normal.       Results for orders placed or performed in visit on 04/29/22  POCT glycosylated hemoglobin (Hb A1C)  Result Value Ref Range   Hemoglobin A1C 9.2 (A) 4.0 - 5.6 %   HbA1c POC (<> result, manual entry)     HbA1c, POC (prediabetic range)     HbA1c, POC (controlled diabetic range)     Lab Results  Component Value Date   CREATININE 1.50 01/26/2022   BUN 16 01/26/2022   NA 138 01/26/2022   K 3.9 01/26/2022   CL 103 01/26/2022   CO2 26 01/26/2022  GFR 47  Assessment & Plan:   Problem List Items Addressed This Visit     Type 2 diabetes mellitus with diabetic nephropathy ([place]) - Primary   Relevant Medications   Semaglutide,0.25 or 0.'5MG'$ /DOS, (OZEMPIC, 0.25 OR 0.5 MG/DOSE,) 2 MG/3ML SOPN   Other Relevant Orders   POCT glycosylated hemoglobin (Hb A1C) (Completed)     Meds ordered this encounter  Medications    Semaglutide,0.25 or 0.'5MG'$ /DOS, (OZEMPIC, 0.25 OR 0.5 MG/DOSE,) 2 MG/3ML SOPN    Sig: Inject 0.25 mg into the skin once a week for 14 days, THEN 0.5 mg once a week.    Dispense:  3 mL    Refill:  6   Orders  Placed This Encounter  Procedures   POCT glycosylated hemoglobin (Hb A1C)     Patient Instructions  Start ozempic 0.55mg weekly for 2 weeks then increase to 05.mg weekly.  Watch for nausea, constipation, upper abdominal pain and let u[place]now if this happens. Drop metformin to 1/2 tablet in the morning and 1 tablet at night time.  Return in 3 months for diabetes follow up visit   Follow up plan: Return in about 3 months (around 07/29/2022) for follow up visit.  JRia Bush MD

## 2022-05-01 NOTE — Assessment & Plan Note (Signed)
Thought progressive supranuclear palsy, followed by [facility] Dr Laurance Flatten on sinemet.  Continues PT, he declined continued ST.

## 2022-05-01 NOTE — Assessment & Plan Note (Signed)
Drop metformin dose.

## 2022-05-01 NOTE — Assessment & Plan Note (Signed)
Chronic, again deteriorated despite current regimen of metformin '1000mg'$  bid, amaryl '4mg'$ , farxiga '10mg'$  daily.  Discussed options including weekly GLP1RA and daily basal insulin - will drop metformin to 500/'1000mg'$  given CKD, discussed GLP1RA - he will price out ozempic. No fmhx thyroid cancer. Reviewed mechanism of action and side effects of medication. RTC 3 mo DM f/u visit.

## 2022-05-01 NOTE — Assessment & Plan Note (Signed)
Saw urology ,appreciate their care. Continues only flomax 0.'4mg'$  nightly. Other meds tried ineffective, pt declined interstim treatment.

## 2022-05-01 NOTE — Assessment & Plan Note (Signed)
Chronic, stable. Continue lisinopril '10mg'$  bid.

## 2022-05-02 ENCOUNTER — Ambulatory Visit: Payer: 59 | Admitting: Family Medicine

## 2022-05-02 ENCOUNTER — Encounter: Payer: 59 | Admitting: Speech Pathology

## 2022-05-03 ENCOUNTER — Ambulatory Visit: Payer: 59 | Admitting: Family Medicine

## 2022-05-04 ENCOUNTER — Encounter: Payer: 59 | Admitting: Speech Pathology

## 2022-05-05 ENCOUNTER — Encounter: Payer: 59 | Admitting: Speech Pathology

## 2022-05-09 ENCOUNTER — Encounter: Payer: 59 | Admitting: Speech Pathology

## 2022-05-11 ENCOUNTER — Encounter: Payer: 59 | Admitting: Speech Pathology

## 2022-05-12 ENCOUNTER — Encounter: Payer: 59 | Admitting: Speech Pathology

## 2022-05-16 ENCOUNTER — Encounter: Payer: 59 | Admitting: Speech Pathology

## 2022-05-19 ENCOUNTER — Encounter: Payer: 59 | Admitting: Speech Pathology

## 2022-05-31 LAB — HM DIABETES EYE EXAM

## 2022-06-03 ENCOUNTER — Encounter: Payer: Self-pay | Admitting: Family Medicine

## 2022-06-29 ENCOUNTER — Telehealth: Payer: Self-pay | Admitting: Family Medicine

## 2022-06-29 MED ORDER — TRULICITY 0.75 MG/0.5ML ~~LOC~~ SOAJ: 0.7500 mg | SUBCUTANEOUS | 3 refills | Status: DC

## 2022-06-29 NOTE — Telephone Encounter (Addendum)
Did he ever start ozempic? If so what dose was he on? I've sent in trulicity 0.'75mg'$  weekly to [facility] to try in place of ozempic.

## 2022-06-29 NOTE — Telephone Encounter (Signed)
Patient wife Kirk Bennett called in and stated that Kirk Bennett hasn't been able to get his Ozempic due to the pharmacy been on back order. She was wondering if there was something else he can take in place of it. Please advise. Thank you!

## 2022-06-29 NOTE — Addendum Note (Signed)
Addended by: Ria Bush on: 06/29/2022 05:04 PM   Modules accepted: Orders

## 2022-06-30 NOTE — Telephone Encounter (Addendum)
Spoke with pt's wife, [name] (on dpr), asking about pt's Ozempic.  She states pt did start Ozempic but stayed on 0.25 mg dose the entire time.  They didn't realize he was to increase the strength after 2 wks to 0.5 mg.  However, pt did not have any issues with the Ozempic.  I notified her rx was sent in for Trulicity 1.98 mg wkly in place of Ozempic for now.  She verbalizes understanding and will inform pt.

## 2022-07-19 ENCOUNTER — Telehealth: Payer: Self-pay | Admitting: Family Medicine

## 2022-07-19 NOTE — Telephone Encounter (Signed)
Pt's wife, [name], called stating Dr. Darnell Level prescribed the pt, Dulaglutide (TRULICITY) 5.53 ZS/8.2LM SOPN, on 06/29/22 & she believes the pt is possibly hallucinating. [name] wants advice on what to do with or for the pt? Call back # [phone]

## 2022-07-20 NOTE — Telephone Encounter (Signed)
I don't think trulicity should cause hallucinations.  What type of symptoms is he having? More trouble with visual or auditory hallucination?  This could be due to parkinsonism. Would encourage they contact his neurologist as well.

## 2022-07-20 NOTE — Telephone Encounter (Addendum)
Patient's wife notified as instructed by telephone and verbalized understanding. Patient's wife stated that they have one cat and her husband is seeing multiply cats. Patient's wife stated that they have read the side effects ofTrulicity and it may cause cancer and they don't want him to take it. Patient's wife stated that Ozempic is available now and wants to know if he should change to that. Patient's wife will reach out to neurologist.

## 2022-07-20 NOTE — Telephone Encounter (Signed)
Trulicity is not started if there's personal or family history of a specific type of thyroid cancer - medullary thyroid cancer, but otherwise shouldn't cause cancer. Same goes for ozempic.   If they'd prefer to try ozempic ok to do - does she want me to send in ozempic Rx?

## 2022-07-21 MED ORDER — OZEMPIC (0.25 OR 0.5 MG/DOSE) 2 MG/3ML ~~LOC~~ SOPN: PEN_INJECTOR | SUBCUTANEOUS | 3 refills | Status: AC

## 2022-07-21 NOTE — Telephone Encounter (Signed)
Ozempic sent to pharmacy.

## 2022-07-21 NOTE — Telephone Encounter (Signed)
Noted  

## 2022-07-21 NOTE — Addendum Note (Signed)
Addended by: Ria Bush on: 07/21/2022 12:52 PM   Modules accepted: Orders

## 2022-07-21 NOTE — Telephone Encounter (Signed)
Spoke with pt relaying Dr. Synthia Innocent message.  Pt verbalizes understanding and prefers to try Ozempic.

## 2022-07-29 ENCOUNTER — Encounter: Payer: Self-pay | Admitting: Family Medicine

## 2022-07-29 ENCOUNTER — Ambulatory Visit: Payer: 59 | Admitting: Family Medicine

## 2022-07-29 VITALS — BP 138/78 | HR 85 | Temp 97.1°F | Ht 74.0 in | Wt 240.4 lb

## 2022-07-29 DIAGNOSIS — R2689 Other abnormalities of gait and mobility: Secondary | ICD-10-CM

## 2022-07-29 DIAGNOSIS — E1140 Type 2 diabetes mellitus with diabetic neuropathy, unspecified: Secondary | ICD-10-CM

## 2022-07-29 DIAGNOSIS — G20C Parkinsonism, unspecified: Secondary | ICD-10-CM

## 2022-07-29 DIAGNOSIS — R441 Visual hallucinations: Secondary | ICD-10-CM | POA: Diagnosis not present

## 2022-07-29 DIAGNOSIS — E1121 Type 2 diabetes mellitus with diabetic nephropathy: Secondary | ICD-10-CM

## 2022-07-29 DIAGNOSIS — Z23 Encounter for immunization: Secondary | ICD-10-CM | POA: Diagnosis not present

## 2022-07-29 DIAGNOSIS — G40109 Localization-related (focal) (partial) symptomatic epilepsy and epileptic syndromes with simple partial seizures, not intractable, without status epilepticus: Secondary | ICD-10-CM

## 2022-07-29 LAB — POCT GLYCOSYLATED HEMOGLOBIN (HGB A1C): Hemoglobin A1C: 6.8 % — AB (ref 4.0–5.6)

## 2022-07-29 NOTE — Assessment & Plan Note (Signed)
Has established with new [facility] epileptologist.  Continues keppra.

## 2022-07-29 NOTE — Assessment & Plan Note (Signed)
He has upcoming appointment with [facility]. Appreciate Dr Tawanna Sat care.

## 2022-07-29 NOTE — Progress Notes (Signed)
Patient ID: Kirk Bennett., male    DOB: [birth year], 71 y.o.   MRN: [id number]  This visit was conducted in person.  BP 138/78   Pulse 85   Temp (!) 97.1 F (36.2 C) (Temporal)   Ht '6\' 2"'$  (1.88 m)   Wt 240 lb 6.4 oz (109 kg)   SpO2 97%   BMI 30.87 kg/m    CC: DM f/u visit  Subjective:   HPI Kirk Bennett. is a 71 y.o. male presenting on 07/29/2022 for Diabetes (Here for 3 mo f/u. Pt accompanied by wife, Kirk Bennett. )   Upcoming movement disorder clinic neurology f/u scheduled 08/01/2022 with Dr Laurance Flatten. Sees for parkinsonisms, possibly PSP.   Saw new epileptologist Dr Haydee Monica NP - meds refilled.    DM - does not regularly check sugars. Compliant with antihyperglycemic regimen which includes: amaryl '4mg'$  daily, farxiga '10mg'$  daily, metformin 500/'1000mg'$  daily, trulicity 0.'75mg'$  weekly. Unable to find ozempic. Visual hallucinations have resolved. Denies low sugars or hypoglycemic symptoms. Denies paresthesias, blurry vision. Last diabetic eye exam 05/2022. Glucometer brand: accuchek. Last foot exam: 01/2022. DSME: previously completed. Lab Results  Component Value Date   HGBA1C 6.8 (A) 07/29/2022   Diabetic Foot Exam - Simple   No data filed    Lab Results  Component Value Date   MICROALBUR <0.2 09/11/2015         Relevant past medical, surgical, family and social history reviewed and updated as indicated. Interim medical history since our last visit reviewed. Allergies and medications reviewed and updated. Outpatient Medications Prior to Visit  Medication Sig Dispense Refill   atorvastatin (LIPITOR) 20 MG tablet TAKE 1 TABLET BY MOUTH EVERY DAY 90 tablet 3   carbidopa-levodopa (SINEMET IR) 25-100 MG tablet PLEASE SEE ATTACHED FOR DETAILED DIRECTIONS     Cholecalciferol (VITAMIN D3) 1000 units CAPS Take 1,000 Units by mouth daily.     clotrimazole (LOTRIMIN AF) 1 % cream Apply 1 application topically 2 (two) times daily. 113 g 0   dapagliflozin propanediol (FARXIGA) 10  MG TABS tablet Take 1 tablet (10 mg total) by mouth daily. 30 tablet 6   glimepiride (AMARYL) 4 MG tablet Take 1 tablet (4 mg total) by mouth daily with breakfast. 90 tablet 1   glucose blood (ONE TOUCH ULTRA TEST) test strip Use to check sugar fasting in the AM and 2 hours after lunch or supper. Dx: E11.21 100 each 3   levETIRAcetam (KEPPRA) 1000 MG tablet Take 1,000 mg by mouth 2 (two) times daily.     lisinopril (ZESTRIL) 10 MG tablet TAKE 1 TABLET BY MOUTH TWICE A DAY 180 tablet 3   metFORMIN (GLUCOPHAGE) 1000 MG tablet Take 0.5 tablets (500 mg total) by mouth daily with breakfast AND 1 tablet (1,000 mg total) daily with supper. 135 tablet 3   Multiple Vitamin (MULTIVITAMIN ADULT) TABS Take 1 tablet by mouth daily.     Semaglutide,0.25 or 0.'5MG'$ /DOS, (OZEMPIC, 0.25 OR 0.5 MG/DOSE,) 2 MG/3ML SOPN Inject 0.25 mg into the skin once a week for 14 days, THEN 0.5 mg once a week. 3 mL 3   tamsulosin (FLOMAX) 0.4 MG CAPS capsule Take 1 capsule (0.4 mg total) by mouth daily. Take 30 minutes after the same meal each day 30 capsule 11   vitamin B-12 (CYANOCOBALAMIN) 1000 MCG tablet Take 1 tablet (1,000 mcg total) by mouth daily.     Facility-Administered Medications Prior to Visit  Medication Dose Route Frequency Provider Last Rate Last Admin  lidocaine (XYLOCAINE) 2 % jelly 1 application  1 application  Urethral Once Billey Co, MD         Per HPI unless specifically indicated in ROS section below Review of Systems  Objective:  BP 138/78   Pulse 85   Temp (!) 97.1 F (36.2 C) (Temporal)   Ht '6\' 2"'$  (1.88 m)   Wt 240 lb 6.4 oz (109 kg)   SpO2 97%   BMI 30.87 kg/m   Wt Readings from Last 3 Encounters:  07/29/22 240 lb 6.4 oz (109 kg)  04/29/22 236 lb (107 kg)  03/07/22 240 lb (108.9 kg)      Physical Exam Vitals and nursing note reviewed.  Constitutional:      Appearance: Normal appearance. He is not ill-appearing.     Comments: Sitting in wheelchair  HENT:     Head:  Normocephalic and atraumatic.     Mouth/Throat:     Comments: Wearing mask Eyes:     Extraocular Movements: Extraocular movements intact.     Conjunctiva/sclera: Conjunctivae normal.     Pupils: Pupils are equal, round, and reactive to light.  Cardiovascular:     Rate and Rhythm: Normal rate and regular rhythm.     Pulses: Normal pulses.     Heart sounds: Normal heart sounds. No murmur heard. Pulmonary:     Effort: Pulmonary effort is normal. No respiratory distress.     Breath sounds: Normal breath sounds. No wheezing, rhonchi or rales.  Musculoskeletal:     Right lower leg: No edema.     Left lower leg: No edema.     Comments: See HPI for foot exam if done  Skin:    General: Skin is warm and dry.     Findings: No rash.  Neurological:     Mental Status: He is alert.  Psychiatric:        Mood and Affect: Mood normal.        Behavior: Behavior normal.       Results for orders placed or performed in visit on 07/29/22  POCT glycosylated hemoglobin (Hb A1C)  Result Value Ref Range   Hemoglobin A1C 6.8 (A) 4.0 - 5.6 %   HbA1c POC (<> result, manual entry)     HbA1c, POC (prediabetic range)     HbA1c, POC (controlled diabetic range)      Assessment & Plan:   Problem List Items Addressed This Visit       Unprioritized   Type 2 diabetes mellitus with diabetic nephropathy ([place]) - Primary    Chronic, marked improvement in diabetic control since starting trulicity based on D7A today. Congratulated - wife mentions they've focused on healthy diet changes. He is not checking sugars at home. Continue current regimen, reassess at CPE in 3 months.      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  Continues keppra.       Impaired gait and mobility    Continues walker use. Outpatient PT course at [facility] completed 02/2022      Type  2 diabetes mellitus with diabetic neuropathy, unspecified ([place])   Atypical parkinsonism    He has upcoming appointment with [facility]. Appreciate Dr Tawanna Sat care.       Visual hallucinations    Episodes recently of seeing multiple cats at home. I don't think this was trulicity  related. ?Parkinsonism related. Fortunately this has now resolved.       Other Visit Diagnoses     Need for influenza vaccination       Relevant Orders   Flu Vaccine QUAD High Dose(Fluad) (Completed)        No orders of the defined types were placed in this encounter.  Orders Placed This Encounter  Procedures   Flu Vaccine QUAD High Dose(Fluad)   POCT glycosylated hemoglobin (Hb A1C)     Patient Instructions  Flu shot today  A1c is great today - down to 6.8%!  You are doing well today  Continue current medicines Return in 3 months for physical.   Follow up plan: Return in about 3 months (around 10/28/2022) for annual exam, prior fasting for blood work.  Ria Bush, MD

## 2022-07-29 NOTE — Assessment & Plan Note (Signed)
Continues walker use. Outpatient PT course at [facility] completed 02/2022

## 2022-07-29 NOTE — Assessment & Plan Note (Signed)
Chronic, marked improvement in diabetic control since starting trulicity based on P1Y today. Congratulated - wife mentions they've focused on healthy diet changes. He is not checking sugars at home. Continue current regimen, reassess at CPE in 3 months.

## 2022-07-29 NOTE — Patient Instructions (Addendum)
Flu shot today  A1c is great today - down to 6.8%!  You are doing well today  Continue current medicines Return in 3 months for physical.

## 2022-07-29 NOTE — Assessment & Plan Note (Signed)
Episodes recently of seeing multiple cats at home. I don't think this was trulicity related. ?Parkinsonism related. Fortunately this has now resolved.

## 2022-10-04 NOTE — Therapy (Unsigned)
OUTPATIENT PHYSICAL THERAPY NEURO EVALUATION   Patient Name: Kirk Bennett. MRN: [id number] DOB:[birth year], 72 y.o., male Today's Date: 10/05/2022   PCP: Ria Bush, MD  REFERRING PROVIDER: Jeanette Caprice,  MD    END OF SESSION:  PT End of Session - 10/05/22 0758     Visit Number 1    Number of Visits 24    Date for PT Re-Evaluation 12/28/22    Authorization Type --    PT Start Time 0801    PT Stop Time [id number]    PT Time Calculation (min) 53 min    Equipment Utilized During Treatment Gait belt    Activity Tolerance Patient tolerated treatment well;Patient limited by fatigue    Behavior During Therapy [employer] for tasks assessed/performed             Past Medical History:  Diagnosis Date   Cataracts, bilateral 02/2011   and suspected glaucoma, to return for f/u, no diabetic retinopathy   CKD stage 3 due to type 2 diabetes mellitus ([place]) 2015   normal renal US, self referred to Dr Holley Raring   Complex partial seizures ([place]) 1990   from surgery for R temporal arachnoid cyst s/p drainage, possible continued sz so changed to lamotrigine (Dr. Mora Bellman at [facility])   Depression    found by neuro   Elevated PSA 05/27/2015   Serial monitoring ([place])    Glaucoma 2015   suspect   History of hepatitis A    HLD (hyperlipidemia)    HTN (hypertension)    Knee pain    s/p replacement   SVT (supraventricular tachycardia) 1996   Vitamin D deficiency 03/02/2015   Well controlled type 2 diabetes mellitus with nephropathy ([place]) 1996   established with Dr. Gabriel Carina endo --> 02/2016 decided to return to PCP for DM care   Past Surgical History:  Procedure Laterality Date   CARDIAC CATHETERIZATION  02/19/2009   No blockages (Dr. Liliane Shi)   COLONOSCOPY  09/16/2005   hyperplastic polyps, rpt due 10 yrs    COLONOSCOPY WITH PROPOFOL N/A 12/11/2015   mult polyps, few TA, rpt 71yr ([place])   COLONOSCOPY WITH PROPOFOL N/A 09/02/2021   TAs, HPs, int hem, distal ileal polyposis -  benign, rpt yrs([place], SRichardo Hanks DO)   corrective surgery amblyopia  08/23/1955   Cystectomy or meningioma removal brain  08/22/1988   (unclear)   Left knee surgery  08/22/1969   Torn ACL   REPLACEMENT TOTAL KNEE  11/20/2009   Left ([facility] Dr. 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   Irregular heart beat 07/19/2021   BPH (benign prostatic hyperplasia) 10/03/2020   Pain due to onychomycosis of toenails of both feet 06/18/2020   Stage 3 chronic kidney disease ([place] 10/23/2019   Type 2 diabetes mellitus with diabetic neuropathy, unspecified ([place] 10/21/2019   Impaired gait and mobility 10/14/2019   Postural dizziness with presyncope 10/13/2019   Ventricular premature complexes 10/13/2019   Urge urinary incontinence 09/26/2019   Depressed mood 09/26/2019   Advanced care planning/counseling discussion 09/22/2018   Weakness of both lower extremities 09/22/2018   History of hepatitis 01/05/2018   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   NAFLD (nonalcoholic fatty liver disease) 09/30/2017   Obesity, Class I, BMI 30.0-34.9 (see actual BMI) 09/18/2017   Polyarthralgia 02/15/2017   OSA (obstructive sleep apnea) 02/15/2017   Partial epilepsy with impairment of consciousness ([place] 01/29/2016  Low vitamin B12 level 05/27/2015   Elevated PSA 05/27/2015   Vitamin D deficiency 03/02/2015   Other long term (current) drug therapy 03/12/2012   Encounter for general adult medical examination with abnormal findings 04/26/2011   TOTAL KNEE REPLACEMENT, LEFT, HX OF 04/07/2010   Type 2 diabetes mellitus with diabetic nephropathy ([place]) 03/30/2010   Hyperlipidemia associated with type 2 diabetes mellitus ([place]) 03/30/2010   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 ([place]) 03/30/2010   Essential hypertension, benign 03/30/2010    ONSET DATE: 05/2021  REFERRING DIAG:  G20.C (ICD-10-CM) - Parkinsonism, unspecified   THERAPY DIAG:  Abnormality of gait and mobility  Difficulty in walking, not elsewhere classified  Muscle weakness (generalized)  Other abnormalities of gait and mobility  Unsteadiness on feet  Rationale for Evaluation and Treatment: Rehabilitation  SUBJECTIVE:                                                                                                                                                                                             SUBJECTIVE STATEMENT: Pt and caregiver report all of the following.  Pt has parkinson's and wants to work on his strength in his legs and his mobility. Pt was in [facility] last week for about two weeks until they decided he would be better to complete therapy In a site that was specific for his condition. 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 Pt went to [facility] a few years back with some success. Pt has flat affect and his wife answers many of the subjective questions. Pt and caregiver have different answers when asked about his functional mobility.  Patient's wife reports he is difficulty getting out of a car as well as getting in and out of chairs and maneuvering in bed whereas patient does not report issues with any of these.  Pt reports he is walking around the house some but not nearly as much as he would like. Pt wife reports he has had difficulty getting up and down from chairs without arms but is able to effectively get in and out of chairs with arms.  Pt accompanied by: significant other  PERTINENT HISTORY: Diabetic neuropathy, atypical PD which MD thinks could be PSP, pt reports diagnosis is parkinson's disease.    PAIN:  Are you having pain? No  PRECAUTIONS: Fall  WEIGHT BEARING RESTRICTIONS: No  FALLS: Has patient fallen in last 6 months? Yes. Number of falls 1 fall when he slippe dand fell getting out of the shower.   LIVING  ENVIRONMENT: Lives with: lives with  their family and lives with their spouse Lives in: House/apartment Stairs: Yes: Internal: 72, 2 sets steps; on right going up and External: 5 steps; on right going up Has following equipment at home: Single point cane, Walker - 2 wheeled, shower chair, and elevated commode  PLOF: Independent with basic ADLs, Independent with household mobility with device, Independent with community mobility with device, and Needs assistance with homemaking  PATIENT GOALS: Lower and upper body strength as well as balance   OBJECTIVE:   DIAGNOSTIC FINDINGS: No significant findings   COGNITION: Overall cognitive status:  A little flat but otherwise WNL    SENSATION: Not tested  COORDINATION: Not tested   EDEMA:  None   MUSCLE TONE: Not tested, if in supine may be good to assess if pt has rigidity      POSTURE: rounded shoulders, forward head, and increased thoracic kyphosis  LOWER EXTREMITY ROM:   WNL in seated, in standing pt unable to obtain full erect posture.    LOWER EXTREMITY MMT:    MMT Right Eval Left Eval  Hip flexion 4 4-  Hip extension    Hip abduction 4+ 4+  Hip adduction 4 4  Hip internal rotation    Hip external rotation    Knee flexion 4+ 4+  Knee extension 4+ 4+  Ankle dorsiflexion 4+ 4+  Ankle plantarflexion 5 5  Ankle inversion    Ankle eversion    (Blank rows = not tested) All tested in seated position   BED MOBILITY:  Not tested at eval   TRANSFERS: Assistive device utilized:  UE and cane    Sit to stand: CGA Stand to sit: CGA Chair to chair: CGA Floor:  Not indicated for testing     CURB: Not tested   STAIRS: Not tested at eval, to look at in future assessments    GAIT: Gait pattern: step through pattern, decreased stride length, knee flexed in stance- Right, knee flexed in stance- Left, shuffling, and trunk flexed Distance walked: 30 feet  Assistive device utilized: Single point cane Level of  assistance: CGA Comments: uses walls and other ibjects in clinic for steadying   FUNCTIONAL TESTS:  5 times sit to stand: 73.7 sec with significant UE assist and unable to stand in full erect posture  Timed up and go (TUG): 42.45 sec with SPC and with UE assist on STS transfer 10 meter walk test: Test visit 2  Berg Balance Scale: Test visit 2   PATIENT SURVEYS:  FOTO 40  TODAY'S TREATMENT:                                                                                                                              DATE: Eval Only     PATIENT EDUCATION: Education details: POC  Person educated: Patient and Spouse Education method: Explanation Education comprehension: verbalized understanding  HOME EXERCISE PROGRAM: To begin next session.   GOALS: Goals reviewed with patient? Yes  SHORT TERM GOALS: Target date: 11/02/2022       Patient will be independent in home exercise program to improve strength/mobility for better functional independence with ADLs. Baseline: No HEP currently  Goal status: INITIAL   LONG TERM GOALS: Target date: 12/28/2022    1.  Patient (> 75 years old) will complete five times sit to stand test in < 30 seconds indicating an increased LE strength and improved balance. Baseline: 73.7 sec with significant UE assistance and not able to obtain full erect posture in standing  Goal status: INITIAL  2.  Patient will increase FOTO score to equal to or greater than  47   to demonstrate statistically significant improvement in mobility and quality of life.  Baseline: 40 Goal status: INITIAL   3.  Patient will increase Berg Balance score by > 6 points to demonstrate decreased fall risk during functional activities. Baseline: test visit 2  Goal status: INITIAL   4.   Patient will reduce timed up and go to <25 seconds with LRAD to reduce fall risk and demonstrate improved transfer/gait ability. Baseline: 42.45 with SPC and UE assist with stand  Goal status:  INITIAL  5.   Patient will increase 10 meter walk test to >1.6ms as to improve gait speed for better community ambulation and to reduce fall risk. Baseline: Test visit 2  Goal status: INITIAL       ASSESSMENT:  CLINICAL IMPRESSION: Patient is a 72y.o. male who was seen today for physical therapy evaluation and treatment for balance, mobility and strength decline since diagnosis with PD last year.  Patient presents with significant difficulty with transfers evidenced by 5 times sit to stand as well as timed up and go.  Physical therapist not able to test 10 m walk test this patient at urgent need to use the bathroom during mid performance of this.  This will be deferred to next visit.  During initial stage of 10 m walk test patient did utilize straight point cane and had constant flexed posture throughout and did attempt to reach for various objects within the clinic along the path in order to steady himself.  Patient also has to stop and rest a couple times during the ambulation.  Patient demonstrates typical parkinsonian gait with flexed posture and shuffling pattern both of which may be addressed with home exercise program interventions next session.  Patient will benefit from skilled physical therapy intervention in order to improve his transfer ability, lower extremity strength, improve balance, decrease risk of falls, improve mobility, and  improve quality of life.  OBJECTIVE IMPAIRMENTS: Abnormal gait, decreased activity tolerance, decreased balance, decreased endurance, decreased knowledge of use of DME, decreased mobility, difficulty walking, decreased strength, decreased safety awareness, impaired perceived functional ability, and improper body mechanics.   ACTIVITY LIMITATIONS: bending, sitting, standing, squatting, stairs, transfers, bed mobility, bathing, toileting, and locomotion level  PARTICIPATION LIMITATIONS:  Pt reports no participation restrictions when prompted   PERSONAL  FACTORS: 3+ comorbidities: HTN, depressed mood ( chart), T2DM  are also affecting patient's functional outcome.   REHAB POTENTIAL: Fair secondary to current functional level and progressive aspect of diagnosis   CLINICAL DECISION MAKING: Evolving/moderate complexity  EVALUATION COMPLEXITY: Moderate  PLAN:  PT FREQUENCY: 2x/week  PT DURATION: 12 weeks  PLANNED INTERVENTIONS: Therapeutic exercises, Therapeutic activity, Neuromuscular re-education, Balance training, Gait training, Patient/Family education, Self Care, Joint mobilization, Stair training, and Manual therapy  PLAN FOR NEXT SESSION: BERG, 10 meter walk, HEP ( posture, STS progression, balance  as indicated by [facility] and clinical judgment from treating PT)    Particia Lather, PT 10/05/2022, 8:59 AM

## 2022-10-05 ENCOUNTER — Ambulatory Visit: Payer: 59 | Attending: Family Medicine | Admitting: Physical Therapy

## 2022-10-05 ENCOUNTER — Encounter: Payer: Self-pay | Admitting: Physical Therapy

## 2022-10-05 DIAGNOSIS — R296 Repeated falls: Secondary | ICD-10-CM | POA: Diagnosis present

## 2022-10-05 DIAGNOSIS — R41841 Cognitive communication deficit: Secondary | ICD-10-CM | POA: Insufficient documentation

## 2022-10-05 DIAGNOSIS — R2681 Unsteadiness on feet: Secondary | ICD-10-CM | POA: Insufficient documentation

## 2022-10-05 DIAGNOSIS — R262 Difficulty in walking, not elsewhere classified: Secondary | ICD-10-CM | POA: Insufficient documentation

## 2022-10-05 DIAGNOSIS — R293 Abnormal posture: Secondary | ICD-10-CM | POA: Diagnosis present

## 2022-10-05 DIAGNOSIS — M6281 Muscle weakness (generalized): Secondary | ICD-10-CM | POA: Insufficient documentation

## 2022-10-05 DIAGNOSIS — R269 Unspecified abnormalities of gait and mobility: Secondary | ICD-10-CM | POA: Diagnosis present

## 2022-10-05 DIAGNOSIS — R2689 Other abnormalities of gait and mobility: Secondary | ICD-10-CM | POA: Diagnosis present

## 2022-10-11 ENCOUNTER — Ambulatory Visit: Payer: 59

## 2022-10-11 DIAGNOSIS — R269 Unspecified abnormalities of gait and mobility: Secondary | ICD-10-CM | POA: Diagnosis not present

## 2022-10-11 DIAGNOSIS — R2689 Other abnormalities of gait and mobility: Secondary | ICD-10-CM

## 2022-10-11 DIAGNOSIS — M6281 Muscle weakness (generalized): Secondary | ICD-10-CM

## 2022-10-11 DIAGNOSIS — R262 Difficulty in walking, not elsewhere classified: Secondary | ICD-10-CM

## 2022-10-11 DIAGNOSIS — R2681 Unsteadiness on feet: Secondary | ICD-10-CM

## 2022-10-11 NOTE — Therapy (Signed)
OUTPATIENT PHYSICAL THERAPY NEURO TREATMENT   Patient Name: Kirk Bennett. MRN: [id number] DOB:1951/05/31, 72 y.o., male Today's Date: 10/11/2022   PCP: Ria Bush, MD  REFERRING PROVIDER: Jeanette Caprice,  MD    END OF SESSION:  PT End of Session - 10/11/22 1516     Visit Number 2    Number of Visits 24    Date for PT Re-Evaluation 12/28/22    PT Start Time [id number]    PT Stop Time 1600    PT Time Calculation (min) 44 min    Equipment Utilized During Treatment Gait belt    Activity Tolerance Patient tolerated treatment well;Patient limited by fatigue    Behavior During Therapy WFL for tasks assessed/performed             Past Medical History:  Diagnosis Date   Cataracts, bilateral 02/2011   and suspected glaucoma, to return for f/u, no diabetic retinopathy   CKD stage 3 due to type 2 diabetes mellitus ([place]) 2015   normal renal US, self referred to Dr Holley Raring   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 Gabriel Carina endo --> 02/2016 decided to return to PCP for DM care   Past Surgical History:  Procedure Laterality Date   CARDIAC CATHETERIZATION  02/19/2009   No blockages (Dr. Liliane Shi)   COLONOSCOPY  09/16/2005   hyperplastic polyps, rpt due 10 yrs    COLONOSCOPY WITH PROPOFOL N/A 12/11/2015   mult polyps, few TA, rpt 68yr ([place])   COLONOSCOPY WITH PROPOFOL N/A 09/02/2021   TAs, HPs, int hem, distal ileal polyposis - benign, rpt yrs([place],  SRichardo Hanks DO)   corrective surgery amblyopia  08/23/1955   Cystectomy or meningioma removal brain  08/22/1988   (unclear)   Left knee surgery  08/22/1969   Torn ACL   REPLACEMENT TOTAL KNEE  11/20/2009   Left ([facility] Dr. 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   Irregular heart beat 07/19/2021   BPH (benign prostatic hyperplasia) 10/03/2020   Pain due to onychomycosis of toenails of both feet 06/18/2020   Stage 3 chronic kidney disease ([place] 10/23/2019   Type 2 diabetes mellitus with diabetic neuropathy, unspecified ([place] 10/21/2019   Impaired gait and mobility 10/14/2019   Postural dizziness with presyncope 10/13/2019   Ventricular premature complexes 10/13/2019   Urge urinary incontinence 09/26/2019   Depressed mood 09/26/2019   Advanced care planning/counseling discussion 09/22/2018   Weakness of both lower extremities 09/22/2018   History of hepatitis 01/05/2018   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   NAFLD (nonalcoholic fatty liver disease) 09/30/2017   Obesity, Class I, BMI 30.0-34.9 (see actual BMI) 09/18/2017   Polyarthralgia 02/15/2017   OSA (obstructive sleep apnea) 02/15/2017   Partial epilepsy with impairment of consciousness ([place] 01/29/2016   Low vitamin B12 level 05/27/2015  Elevated PSA 05/27/2015   Vitamin D deficiency 03/02/2015   Other long term (current) drug therapy 03/12/2012   Encounter for general adult medical examination with abnormal findings 04/26/2011   TOTAL KNEE REPLACEMENT, LEFT, HX OF 04/07/2010   Type 2 diabetes mellitus with diabetic nephropathy ([place]) 03/30/2010   Hyperlipidemia associated with type 2 diabetes mellitus ([place]) 03/30/2010   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 ([place]) 03/30/2010   Essential hypertension, benign 03/30/2010    ONSET DATE: 05/2021  REFERRING DIAG: G20.C (ICD-10-CM) -  Parkinsonism, unspecified   THERAPY DIAG:  Abnormality of gait and mobility  Difficulty in walking, not elsewhere classified  Muscle weakness (generalized)  Other abnormalities of gait and mobility  Unsteadiness on feet  Rationale for Evaluation and Treatment: Rehabilitation  SUBJECTIVE:                                                                                                                                                                                             SUBJECTIVE STATEMENT:   Pt reports watching college football over the weekend.  Pt otherwise did not do  much of anything over the weekend.   Pt accompanied by: significant other  PERTINENT HISTORY: Diabetic neuropathy, atypical PD which MD thinks could be PSP, pt reports diagnosis is parkinson's disease.    PAIN:  Are you having pain? No  PRECAUTIONS: Fall  WEIGHT BEARING RESTRICTIONS: No  FALLS: Has patient fallen in last 6 months? Yes. Number of falls 1 fall when he slippe dand fell getting out of the shower.   LIVING ENVIRONMENT: Lives with: lives with their family and lives with their spouse Lives in: House/apartment Stairs: Yes: Internal: 25, 2 sets steps; on right going up and External: 5 steps; on right going up Has following equipment at home: Single point cane, Walker - 2 wheeled, shower chair, and elevated commode  PLOF: Independent with basic ADLs, Independent with household mobility with device, Independent with community mobility with device, and Needs assistance with homemaking  PATIENT GOALS: Lower and upper body strength as well as balance   OBJECTIVE:   DIAGNOSTIC FINDINGS: No significant findings   COGNITION: Overall cognitive status:  A little flat but otherwise WNL    SENSATION: Not tested  COORDINATION: Not tested   EDEMA:  None   MUSCLE TONE: Not tested, if in supine may be good to assess if pt has rigidity      POSTURE: rounded shoulders, forward head, and  increased thoracic kyphosis  LOWER EXTREMITY ROM:   WNL in seated, in standing pt unable to obtain full erect posture.    LOWER EXTREMITY MMT:  MMT Right Eval Left Eval  Hip flexion 4 4-  Hip extension    Hip abduction 4+ 4+  Hip adduction 4 4  Hip internal rotation    Hip external rotation    Knee flexion 4+ 4+  Knee extension 4+ 4+  Ankle dorsiflexion 4+ 4+  Ankle plantarflexion 5 5  Ankle inversion    Ankle eversion    (Blank rows = not tested) All tested in seated position   BED MOBILITY:  Not tested at eval   TRANSFERS: Assistive device utilized:  UE and cane    Sit to stand: CGA Stand to sit: CGA Chair to chair: CGA Floor:  Not indicated for testing     CURB: Not tested   STAIRS: Not tested at eval, to look at in future assessments    GAIT: Gait pattern: step through pattern, decreased stride length, knee flexed in stance- Right, knee flexed in stance- Left, shuffling, and trunk flexed Distance walked: 30 feet  Assistive device utilized: Single point cane Level of assistance: CGA Comments: uses walls and other ibjects in clinic for steadying   FUNCTIONAL TESTS:  5 times sit to stand: 73.7 sec with significant UE assist and unable to stand in full erect posture  Timed up and go (TUG): 42.45 sec with SPC and with UE assist on STS transfer 10 meter walk test: 57.80 with bariatric walker Berg Balance Scale: 33/56   PATIENT SURVEYS:  FOTO 40  TODAY'S TREATMENT:                                                                                                                              DATE: 10/11/22   Neuro:  10MWT and BERG Balance test performed as noted below.  HEP given to pt and spouse with visual demonstration of how to perform at home and how to utilize counter/kitchen sink for UE support in order to balance while performing the tasks.      PATIENT EDUCATION: Education details: POC  Person educated: Patient and Spouse Education  method: Explanation Education comprehension: verbalized understanding  HOME EXERCISE PROGRAM: To begin next session.   GOALS: Goals reviewed with patient? Yes  SHORT TERM GOALS: Target date: 11/02/2022   Patient will be independent in home exercise program to improve strength/mobility for better functional independence with ADLs. Baseline: No HEP currently  Goal status: INITIAL    LONG TERM GOALS: Target date: 12/28/2022   1.  Patient (> 48 years old) will complete five times sit to stand test in < 30 seconds indicating an increased LE strength and improved balance. Baseline: 73.7 sec with significant UE assistance and not able to obtain full erect posture in standing  Goal status: INITIAL  2.  Patient will increase FOTO score to equal to or greater than  47   to demonstrate statistically significant improvement in mobility and quality of life.  Baseline: 40 Goal status: INITIAL   3.  Patient will increase  Berg Balance score by > 6 points to demonstrate decreased fall risk during functional activities. Baseline: 33/56 Goal status: INITIAL   4.  Patient will reduce timed up and go to <25 seconds with LRAD to reduce fall risk and demonstrate improved transfer/gait ability. Baseline: 42.45 with SPC and UE assist with stand  Goal status: INITIAL  5.  Patient will increase 10 meter walk test to >1.23ms as to improve gait speed for better community ambulation and to reduce fall risk. Baseline: 57.80 sec with bariatric walker Goal status: INITIAL       ASSESSMENT:  CLINICAL IMPRESSION:  Pt responded well to the continued assessment for balance related tasks.  Pt put good effort throughout the session and was able to complete the walking test with the use of a bariatric walker.  Pt continues to demonstrate anterior bias due to the fear of falling backwards, and requires consistent verbal and tactile cuing in order to correct posture.  Pt would likely have better balance with  tasks once he is able to self correct posture and fear of falling.  Pt will continue to benefit from skilled therapy to address remaining deficits in order to improve overall QoL and return to PLOF.      OBJECTIVE IMPAIRMENTS: Abnormal gait, decreased activity tolerance, decreased balance, decreased endurance, decreased knowledge of use of DME, decreased mobility, difficulty walking, decreased strength, decreased safety awareness, impaired perceived functional ability, and improper body mechanics.   ACTIVITY LIMITATIONS: bending, sitting, standing, squatting, stairs, transfers, bed mobility, bathing, toileting, and locomotion level  PARTICIPATION LIMITATIONS:  Pt reports no participation restrictions when prompted   PERSONAL FACTORS: 3+ comorbidities: HTN, depressed mood ( chart), T2DM  are also affecting patient's functional outcome.   REHAB POTENTIAL: Fair secondary to current functional level and progressive aspect of diagnosis   CLINICAL DECISION MAKING: Evolving/moderate complexity  EVALUATION COMPLEXITY: Moderate  PLAN:  PT FREQUENCY: 2x/week  PT DURATION: 12 weeks  PLANNED INTERVENTIONS: Therapeutic exercises, Therapeutic activity, Neuromuscular re-education, Balance training, Gait training, Patient/Family education, Self Care, Joint mobilization, Stair training, and Manual therapy  PLAN FOR NEXT SESSION:   BERG, 10 meter walk, HEP ( posture, STS progression, balance as indicated by BERG and clinical judgment from treating PT)     JGwenlyn Saran PT, DPT Physical Therapist - [facility] 10/11/22, 4:33 PM

## 2022-10-13 ENCOUNTER — Ambulatory Visit: Payer: 59

## 2022-10-13 DIAGNOSIS — R2689 Other abnormalities of gait and mobility: Secondary | ICD-10-CM

## 2022-10-13 DIAGNOSIS — R269 Unspecified abnormalities of gait and mobility: Secondary | ICD-10-CM | POA: Diagnosis not present

## 2022-10-13 DIAGNOSIS — R41841 Cognitive communication deficit: Secondary | ICD-10-CM

## 2022-10-13 DIAGNOSIS — R262 Difficulty in walking, not elsewhere classified: Secondary | ICD-10-CM

## 2022-10-13 DIAGNOSIS — M6281 Muscle weakness (generalized): Secondary | ICD-10-CM

## 2022-10-13 DIAGNOSIS — R2681 Unsteadiness on feet: Secondary | ICD-10-CM

## 2022-10-13 NOTE — Therapy (Signed)
OUTPATIENT PHYSICAL THERAPY NEURO TREATMENT   Patient Name: Kirk Bennett. MRN: [id number] DOB:March 17, 1951, 72 y.o., male Today's Date: 10/13/2022   PCP: Ria Bush, MD  REFERRING PROVIDER: Jeanette Caprice,  MD    END OF SESSION:  PT End of Session - 10/13/22 1351     Visit Number 3    Number of Visits 24    Date for PT Re-Evaluation 12/28/22    PT Start Time [id number]    PT Stop Time 1430    PT Time Calculation (min) 41 min    Equipment Utilized During Treatment Gait belt    Activity Tolerance Patient tolerated treatment well;Patient limited by fatigue    Behavior During Therapy [facility] for tasks assessed/performed             Past Medical History:  Diagnosis Date   Cataracts, bilateral 02/2011   and suspected glaucoma, to return for f/u, no diabetic retinopathy   CKD stage 3 due to type 2 diabetes mellitus ([place]) 2015   normal renal US, self referred to Dr Holley Raring   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 Gabriel Carina endo --> 02/2016 decided to return to PCP for DM care   Past Surgical History:  Procedure Laterality Date   CARDIAC CATHETERIZATION  02/19/2009   No blockages (Dr. Liliane Shi)   COLONOSCOPY  09/16/2005   hyperplastic polyps, rpt due 10 yrs    COLONOSCOPY WITH PROPOFOL N/A 12/11/2015   mult polyps, few TA, rpt 33yr ([place])   COLONOSCOPY WITH PROPOFOL N/A 09/02/2021   TAs, HPs, int hem, distal ileal polyposis - benign, rpt yrs([place],  SRichardo Hanks DO)   corrective surgery amblyopia  08/23/1955   Cystectomy or meningioma removal brain  08/22/1988   (unclear)   Left knee surgery  08/22/1969   Torn ACL   REPLACEMENT TOTAL KNEE  11/20/2009   Left ([facility] Dr. 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   Irregular heart beat 07/19/2021   BPH (benign prostatic hyperplasia) 10/03/2020   Pain due to onychomycosis of toenails of both feet 06/18/2020   Stage 3 chronic kidney disease ([place] 10/23/2019   Type 2 diabetes mellitus with diabetic neuropathy, unspecified ([place] 10/21/2019   Impaired gait and mobility 10/14/2019   Postural dizziness with presyncope 10/13/2019   Ventricular premature complexes 10/13/2019   Urge urinary incontinence 09/26/2019   Depressed mood 09/26/2019   Advanced care planning/counseling discussion 09/22/2018   Weakness of both lower extremities 09/22/2018   History of hepatitis 01/05/2018   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   NAFLD (nonalcoholic fatty liver disease) 09/30/2017   Obesity, Class I, BMI 30.0-34.9 (see actual BMI) 09/18/2017   Polyarthralgia 02/15/2017   OSA (obstructive sleep apnea) 02/15/2017   Partial epilepsy with impairment of consciousness ([place] 01/29/2016   Low vitamin B12 level 05/27/2015  Elevated PSA 05/27/2015   Vitamin D deficiency 03/02/2015   Other long term (current) drug therapy 03/12/2012   Encounter for general adult medical examination with abnormal findings 04/26/2011   TOTAL KNEE REPLACEMENT, LEFT, HX OF 04/07/2010   Type 2 diabetes mellitus with diabetic nephropathy ([place]) 03/30/2010   Hyperlipidemia associated with type 2 diabetes mellitus ([place]) 03/30/2010   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 ([place]) 03/30/2010   Essential hypertension, benign 03/30/2010    ONSET DATE: 05/2021  REFERRING DIAG: G20.C (ICD-10-CM) -  Parkinsonism, unspecified   THERAPY DIAG:  Abnormality of gait and mobility  Difficulty in walking, not elsewhere classified  Muscle weakness (generalized)  Other abnormalities of gait and mobility  Unsteadiness on feet  Cognitive communication deficit  Rationale for Evaluation and Treatment: Rehabilitation  SUBJECTIVE:                                                                                                                                                                                             SUBJECTIVE STATEMENT:   Pt denies any falls or doing anything new from that last visit.     Pt accompanied by: significant other  PERTINENT HISTORY: Diabetic neuropathy, atypical PD which MD thinks could be PSP, pt reports diagnosis is parkinson's disease.    PAIN:  Are you having pain? No  PRECAUTIONS: Fall  WEIGHT BEARING RESTRICTIONS: No  FALLS: Has patient fallen in last 6 months? Yes. Number of falls 1 fall when he slippe dand fell getting out of the shower.   LIVING ENVIRONMENT: Lives with: lives with their family and lives with their spouse Lives in: House/apartment Stairs: Yes: Internal: 46, 2 sets steps; on right going up and External: 5 steps; on right going up Has following equipment at home: Single point cane, Walker - 2 wheeled, shower chair, and elevated commode  PLOF: Independent with basic ADLs, Independent with household mobility with device, Independent with community mobility with device, and Needs assistance with homemaking  PATIENT GOALS: Lower and upper body strength as well as balance   OBJECTIVE:   DIAGNOSTIC FINDINGS: No significant findings   COGNITION: Overall cognitive status:  A little flat but otherwise WNL    SENSATION: Not tested  COORDINATION: Not tested   EDEMA:  None   MUSCLE TONE: Not tested, if in supine may be good to assess if pt has rigidity      POSTURE: rounded shoulders, forward head, and increased thoracic  kyphosis  LOWER EXTREMITY ROM:   WNL in seated, in standing pt unable to obtain full erect posture.    LOWER EXTREMITY MMT:  MMT Right Eval Left Eval  Hip flexion 4 4-  Hip extension    Hip abduction 4+ 4+  Hip adduction 4 4  Hip internal rotation    Hip external rotation    Knee flexion 4+ 4+  Knee extension 4+ 4+  Ankle dorsiflexion 4+ 4+  Ankle plantarflexion 5 5  Ankle inversion    Ankle eversion    (Blank rows = not tested) All tested in seated position   BED MOBILITY:  Not tested at eval   TRANSFERS: Assistive device utilized:  UE and cane    Sit to stand: CGA Stand to sit: CGA Chair to chair: CGA Floor:  Not indicated for testing     CURB: Not tested   STAIRS: Not tested at eval, to look at in future assessments    GAIT: Gait pattern: step through pattern, decreased stride length, knee flexed in stance- Right, knee flexed in stance- Left, shuffling, and trunk flexed Distance walked: 30 feet  Assistive device utilized: Single point cane Level of assistance: CGA Comments: uses walls and other ibjects in clinic for steadying   FUNCTIONAL TESTS:  5 times sit to stand: 73.7 sec with significant UE assist and unable to stand in full erect posture  Timed up and go (TUG): 42.45 sec with SPC and with UE assist on STS transfer 10 meter walk test: 57.80 with bariatric walker Berg Balance Scale: 33/56   PATIENT SURVEYS:  FOTO 40  TODAY'S TREATMENT:                                                                                                                              DATE: 10/13/22   TherEx:  Ambulation with bariatric walker, x1 lap (150') with verbal and visual feedback for proper posture while performing, pt very fatigued following   Following ambulation SpO2: 94% BPM: 79  Standing UE supported hip thrusts/lumbar extension to improve posture, 2x10 however pt very fatigued and noted weakness in the hips when attempting to perform Seated hip  adduction into yellow physioball, 2x15 Seated lateral step over hedgehods, 2x10 each LE with AAROM necessary for the L LE in order to complete final set     PATIENT EDUCATION: Education details: POC  Person educated: Patient and Spouse Education method: Explanation Education comprehension: verbalized understanding  HOME EXERCISE PROGRAM: To begin next session.   GOALS: Goals reviewed with patient? Yes  SHORT TERM GOALS: Target date: 11/02/2022  Patient will be independent in home exercise program to improve strength/mobility for better functional independence with ADLs. Baseline: No HEP currently  Goal status: INITIAL    LONG TERM GOALS: Target date: 12/28/2022  1.  Patient (> 70 years old) will complete five times sit to stand test in < 30 seconds indicating an increased LE strength and improved balance. Baseline: 73.7 sec with significant UE assistance and not able to obtain full erect posture in standing  Goal status: INITIAL  2.  Patient will  increase FOTO score to equal to or greater than  47   to demonstrate statistically significant improvement in mobility and quality of life.  Baseline: 40 Goal status: INITIAL   3.  Patient will increase Berg Balance score by > 6 points to demonstrate decreased fall risk during functional activities. Baseline: 33/56 Goal status: INITIAL   4.  Patient will reduce timed up and go to <25 seconds with LRAD to reduce fall risk and demonstrate improved transfer/gait ability. Baseline: 42.45 with SPC and UE assist with stand  Goal status: INITIAL  5.  Patient will increase 10 meter walk test to >1.39ms as to improve gait speed for better community ambulation and to reduce fall risk. Baseline: 57.80 sec with bariatric walker Goal status: INITIAL       ASSESSMENT:  CLINICAL IMPRESSION:   Pt continues to demonstrate flat affect throughout the session.  Pt much more fatigued with the ambulation attempts and notes that he is  experiencing fatigue in the hips with the hip extension/thrusts while supported by UE's at the wall.  Pt demonstrates significant tightness in the hip flexors that will continue to need addressing in future visits.  Pt also brought cane to utilize, but due to instability at this time, bariatric walker utilized and is most appropriate for pt at this time.   Pt will continue to benefit from skilled therapy to address remaining deficits in order to improve overall QoL and return to PLOF.      OBJECTIVE IMPAIRMENTS: Abnormal gait, decreased activity tolerance, decreased balance, decreased endurance, decreased knowledge of use of DME, decreased mobility, difficulty walking, decreased strength, decreased safety awareness, impaired perceived functional ability, and improper body mechanics.   ACTIVITY LIMITATIONS: bending, sitting, standing, squatting, stairs, transfers, bed mobility, bathing, toileting, and locomotion level  PARTICIPATION LIMITATIONS:  Pt reports no participation restrictions when prompted   PERSONAL FACTORS: 3+ comorbidities: HTN, depressed mood ( chart), T2DM  are also affecting patient's functional outcome.   REHAB POTENTIAL: Fair secondary to current functional level and progressive aspect of diagnosis   CLINICAL DECISION MAKING: Evolving/moderate complexity  EVALUATION COMPLEXITY: Moderate  PLAN:  PT FREQUENCY: 2x/week  PT DURATION: 12 weeks  PLANNED INTERVENTIONS: Therapeutic exercises, Therapeutic activity, Neuromuscular re-education, Balance training, Gait training, Patient/Family education, Self Care, Joint mobilization, Stair training, and Manual therapy  PLAN FOR NEXT SESSION:    Hip flexor stretching at the stairs, continued ambulation with use of the bariatric walker, strengthening of the LE's likely in a seated position due to fatigue levels in standing.    JGwenlyn Saran PT, DPT Physical Therapist - [facility] 10/13/22,  4:43 PM

## 2022-10-16 ENCOUNTER — Other Ambulatory Visit: Payer: Self-pay | Admitting: Family Medicine

## 2022-10-17 NOTE — Telephone Encounter (Signed)
Change in therapy. Trulicity stopped and changed to Ozempic. (See 07/19/22 phn note.)  Request denied.

## 2022-10-18 ENCOUNTER — Ambulatory Visit: Payer: 59

## 2022-10-18 DIAGNOSIS — R2681 Unsteadiness on feet: Secondary | ICD-10-CM

## 2022-10-18 DIAGNOSIS — R296 Repeated falls: Secondary | ICD-10-CM

## 2022-10-18 DIAGNOSIS — R269 Unspecified abnormalities of gait and mobility: Secondary | ICD-10-CM | POA: Diagnosis not present

## 2022-10-18 DIAGNOSIS — R293 Abnormal posture: Secondary | ICD-10-CM

## 2022-10-18 DIAGNOSIS — M6281 Muscle weakness (generalized): Secondary | ICD-10-CM

## 2022-10-18 DIAGNOSIS — R262 Difficulty in walking, not elsewhere classified: Secondary | ICD-10-CM

## 2022-10-18 DIAGNOSIS — R2689 Other abnormalities of gait and mobility: Secondary | ICD-10-CM

## 2022-10-18 NOTE — Therapy (Signed)
OUTPATIENT PHYSICAL THERAPY NEURO TREATMENT   Patient Name: Kirk Bennett. MRN: [id number] DOB:09-04-1950, 72 y.o., male Today's Date: 10/18/2022   PCP: Ria Bush, MD  REFERRING PROVIDER: Jeanette Caprice,  MD    END OF SESSION:  PT End of Session - 10/18/22 1519     Visit Number 4    Number of Visits 24    Date for PT Re-Evaluation 12/28/22    PT Start Time 1519    PT Stop Time 1600    PT Time Calculation (min) 41 min    Equipment Utilized During Treatment Gait belt    Activity Tolerance Patient tolerated treatment well;Patient limited by fatigue    Behavior During Therapy WFL for tasks assessed/performed             Past Medical History:  Diagnosis Date   Cataracts, bilateral 02/2011   and suspected glaucoma, to return for f/u, no diabetic retinopathy   CKD stage 3 due to type 2 diabetes mellitus ([place]) 2015   normal renal US, self referred to Dr Holley Raring   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 Gabriel Carina endo --> 02/2016 decided to return to PCP for DM care   Past Surgical History:  Procedure Laterality Date   CARDIAC CATHETERIZATION  02/19/2009   No blockages (Dr. 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   Irregular heart beat 07/19/2021   BPH (benign prostatic hyperplasia) 10/03/2020   Pain due to onychomycosis of toenails of both feet 06/18/2020   Stage 3 chronic kidney disease ([place] 10/23/2019   Type 2 diabetes mellitus with diabetic neuropathy, unspecified ([place] 10/21/2019   Impaired gait and mobility 10/14/2019   Postural dizziness with presyncope 10/13/2019   Ventricular premature complexes 10/13/2019   Urge urinary incontinence 09/26/2019   Depressed mood 09/26/2019   Advanced care planning/counseling discussion 09/22/2018   Weakness of both lower extremities 09/22/2018   History of hepatitis 01/05/2018   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   NAFLD (nonalcoholic fatty liver disease) 09/30/2017   Obesity, Class I, BMI 30.0-34.9 (see actual BMI) 09/18/2017   Polyarthralgia 02/15/2017   OSA (obstructive sleep apnea) 02/15/2017   Partial epilepsy with impairment of consciousness ([place] 01/29/2016   Low vitamin B12 level 05/27/2015  Elevated PSA 05/27/2015   Vitamin D deficiency 03/02/2015   Other long term (current) drug therapy 03/12/2012   Encounter for general adult medical examination with abnormal findings 04/26/2011   TOTAL KNEE REPLACEMENT, LEFT, HX OF 04/07/2010   Type 2 diabetes mellitus with diabetic nephropathy ([place]) 03/30/2010   Hyperlipidemia associated with type 2 diabetes mellitus ([place]) 03/30/2010   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 ([place]) 03/30/2010   Essential hypertension, benign 03/30/2010    ONSET DATE: 05/2021  REFERRING DIAG: G20.C (ICD-10-CM) -  Parkinsonism, unspecified   THERAPY DIAG:  Abnormality of gait and mobility  Difficulty in walking, not elsewhere classified  Muscle weakness (generalized)  Other abnormalities of gait and mobility  Unsteadiness on feet  Abnormal posture  Repeated falls  Rationale for Evaluation and Treatment: Rehabilitation  SUBJECTIVE:                                                                                                                                                                                             SUBJECTIVE STATEMENT:   Pt denies doing anything fun over the weekend, however wife states that the pt did do some of his exercises.     Pt accompanied by: significant other  PERTINENT HISTORY: Diabetic neuropathy, atypical PD which MD thinks could be PSP, pt reports diagnosis is parkinson's disease.    PAIN:  Are you having pain? No  PRECAUTIONS: Fall  WEIGHT BEARING RESTRICTIONS: No  FALLS: Has patient fallen in last 6 months? Yes. Number of falls 1 fall when he slippe dand fell getting out of the shower.   LIVING ENVIRONMENT: Lives with: lives with their family and lives with their spouse Lives in: House/apartment Stairs: Yes: Internal: 32, 2 sets steps; on right going up and External: 5 steps; on right going up Has following equipment at home: Single point cane, Walker - 2 wheeled, shower chair, and elevated commode  PLOF: Independent with basic ADLs, Independent with household mobility with device, Independent with community mobility with device, and Needs assistance with homemaking  PATIENT GOALS: Lower and upper body strength as well as balance   OBJECTIVE:   DIAGNOSTIC FINDINGS: No significant findings   COGNITION: Overall cognitive status:  A little flat but otherwise WNL    SENSATION: Not tested  COORDINATION: Not tested   EDEMA:  None   MUSCLE TONE: Not tested, if in supine may be good to assess if pt has rigidity      POSTURE:  rounded shoulders, forward head, and increased thoracic kyphosis  LOWER EXTREMITY ROM:   WNL in seated, in standing pt unable to obtain full erect  posture.    LOWER EXTREMITY MMT:    MMT Right Eval Left Eval  Hip flexion 4 4-  Hip extension    Hip abduction 4+ 4+  Hip adduction 4 4  Hip internal rotation    Hip external rotation    Knee flexion 4+ 4+  Knee extension 4+ 4+  Ankle dorsiflexion 4+ 4+  Ankle plantarflexion 5 5  Ankle inversion    Ankle eversion    (Blank rows = not tested) All tested in seated position   BED MOBILITY:  Not tested at eval   TRANSFERS: Assistive device utilized:  UE and cane    Sit to stand: CGA Stand to sit: CGA Chair to chair: CGA Floor:  Not indicated for testing     CURB: Not tested   STAIRS: Not tested at eval, to look at in future assessments    GAIT: Gait pattern: step through pattern, decreased stride length, knee flexed in stance- Right, knee flexed in stance- Left, shuffling, and trunk flexed Distance walked: 30 feet  Assistive device utilized: Single point cane Level of assistance: CGA Comments: uses walls and other ibjects in clinic for steadying   FUNCTIONAL TESTS:  5 times sit to stand: 73.7 sec with significant UE assist and unable to stand in full erect posture  Timed up and go (TUG): 42.45 sec with SPC and with UE assist on STS transfer 10 meter walk test: 57.80 with bariatric walker Berg Balance Scale: 33/56   PATIENT SURVEYS:  FOTO 40  TODAY'S TREATMENT:                                                                                                                              DATE: 10/18/22   TherAct:  Pt requested to be assisted to the bathroom.  Pt ambulated to the bathroom and therapist assisted pt in getting into correct position.  Pt spent significant amount of time in bathroom with therapist checking in periodically.  Pt required assistance in getting up off the toilet, however pt was completely  clothed and unsure if pt actually doffed his clothing prior to going to the restroom.   Ambulation with bariatric walker, x2 laps (150') with verbal and visual feedback for proper posture while performing, pt very fatigued following   Lunges at stairs in order to promote improved hip extension necessary for proper ambulation, 2x10 each LE     PATIENT EDUCATION: Education details: POC  Person educated: Patient and Spouse Education method: Explanation Education comprehension: verbalized understanding  HOME EXERCISE PROGRAM: To begin next session.   GOALS: Goals reviewed with patient? Yes  SHORT TERM GOALS: Target date: 11/02/2022  Patient will be independent in home exercise program to improve strength/mobility for better functional independence with ADLs. Baseline: No HEP currently  Goal status: INITIAL    LONG TERM GOALS: Target date: 12/28/2022  1.  Patient (> 67 years old) will complete five times sit to stand test in <  30 seconds indicating an increased LE strength and improved balance. Baseline: 73.7 sec with significant UE assistance and not able to obtain full erect posture in standing  Goal status: INITIAL  2.  Patient will increase FOTO score to equal to or greater than  47   to demonstrate statistically significant improvement in mobility and quality of life.  Baseline: 40 Goal status: INITIAL   3.  Patient will increase Berg Balance score by > 6 points to demonstrate decreased fall risk during functional activities. Baseline: 33/56 Goal status: INITIAL   4.  Patient will reduce timed up and go to <25 seconds with LRAD to reduce fall risk and demonstrate improved transfer/gait ability. Baseline: 42.45 with SPC and UE assist with stand  Goal status: INITIAL  5.  Patient will increase 10 meter walk test to >1.71ms as to improve gait speed for better community ambulation and to reduce fall risk. Baseline: 57.80 sec with bariatric walker Goal status:  INITIAL       ASSESSMENT:  CLINICAL IMPRESSION:  Session was limited due to pt spending significant amount of time in the bathroom, and unsure of if the pt was able to doff the clothes in order to utilize the restroom.  Therapist spoke with spouse and she states he usually needs assistance getting to the bathroom, but is able to don/doff his clothing without any complication.  Pt may need further supervision with bathroom breaks in the future if pt requests to utilize the bathroom.  Pt also still very limited with hip extension that is necessary for proper gait mechanics.  Pt continues to have very flat affect and has decreased safety awareness.  Pt's spouse may come back for future session to assist with bathroom breaks and other exercises.   Pt will continue to benefit from skilled therapy to address remaining deficits in order to improve overall QoL and return to PLOF.       OBJECTIVE IMPAIRMENTS: Abnormal gait, decreased activity tolerance, decreased balance, decreased endurance, decreased knowledge of use of DME, decreased mobility, difficulty walking, decreased strength, decreased safety awareness, impaired perceived functional ability, and improper body mechanics.   ACTIVITY LIMITATIONS: bending, sitting, standing, squatting, stairs, transfers, bed mobility, bathing, toileting, and locomotion level  PARTICIPATION LIMITATIONS:  Pt reports no participation restrictions when prompted   PERSONAL FACTORS: 3+ comorbidities: HTN, depressed mood ( chart), T2DM  are also affecting patient's functional outcome.   REHAB POTENTIAL: Fair secondary to current functional level and progressive aspect of diagnosis   CLINICAL DECISION MAKING: Evolving/moderate complexity  EVALUATION COMPLEXITY: Moderate  PLAN:  PT FREQUENCY: 2x/week  PT DURATION: 12 weeks  PLANNED INTERVENTIONS: Therapeutic exercises, Therapeutic activity, Neuromuscular re-education, Balance training, Gait training,  Patient/Family education, Self Care, Joint mobilization, Stair training, and Manual therapy   PLAN FOR NEXT SESSION:    Hip flexor stretching at the stairs, continued ambulation with use of the bariatric walker, strengthening of the LE's likely in a seated position due to fatigue levels in standing.    JGwenlyn Saran PT, DPT Physical Therapist - [facility] 10/18/22, 5:33 PM

## 2022-10-20 ENCOUNTER — Ambulatory Visit: Payer: 59

## 2022-10-20 DIAGNOSIS — R269 Unspecified abnormalities of gait and mobility: Secondary | ICD-10-CM | POA: Diagnosis not present

## 2022-10-20 DIAGNOSIS — R2689 Other abnormalities of gait and mobility: Secondary | ICD-10-CM

## 2022-10-20 DIAGNOSIS — R296 Repeated falls: Secondary | ICD-10-CM

## 2022-10-20 DIAGNOSIS — M6281 Muscle weakness (generalized): Secondary | ICD-10-CM

## 2022-10-20 DIAGNOSIS — R2681 Unsteadiness on feet: Secondary | ICD-10-CM

## 2022-10-20 DIAGNOSIS — R262 Difficulty in walking, not elsewhere classified: Secondary | ICD-10-CM

## 2022-10-20 NOTE — Therapy (Signed)
OUTPATIENT PHYSICAL THERAPY NEURO TREATMENT   Patient Name: Kirk Bennett. MRN: [id number] DOB:02-May-1951, 72 y.o., male Today's Date: 10/20/2022   PCP: Ria Bush, MD  REFERRING PROVIDER: Jeanette Caprice,  MD    END OF SESSION:  PT End of Session - 10/20/22 1602     Visit Number 5    Number of Visits 24    Date for PT Re-Evaluation 12/28/22    PT Start Time 1603    PT Stop Time 1645    PT Time Calculation (min) 42 min    Equipment Utilized During Treatment Gait belt    Activity Tolerance Patient tolerated treatment well;Patient limited by fatigue    Behavior During Therapy WFL for tasks assessed/performed             Past Medical History:  Diagnosis Date   Cataracts, bilateral 02/2011   and suspected glaucoma, to return for f/u, no diabetic retinopathy   CKD stage 3 due to type 2 diabetes mellitus ([place]) 2015   normal renal US, self referred to Dr Holley Raring   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 Gabriel Carina endo --> 02/2016 decided to return to PCP for DM care   Past Surgical History:  Procedure Laterality Date   CARDIAC CATHETERIZATION  02/19/2009   No blockages (Dr. Liliane Shi)   COLONOSCOPY  09/16/2005   hyperplastic polyps, rpt due 10 yrs    COLONOSCOPY WITH PROPOFOL N/A 12/11/2015   mult polyps, few TA, rpt 40yr ([place])   COLONOSCOPY WITH PROPOFOL N/A 09/02/2021   TAs, HPs, int hem, distal ileal polyposis - benign, rpt yrs([place],  SRichardo Hanks DO)   corrective surgery amblyopia  08/23/1955   Cystectomy or meningioma removal brain  08/22/1988   (unclear)   Left knee surgery  08/22/1969   Torn ACL   REPLACEMENT TOTAL KNEE  11/20/2009   Left ([facility] Dr. 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   Irregular heart beat 07/19/2021   BPH (benign prostatic hyperplasia) 10/03/2020   Pain due to onychomycosis of toenails of both feet 06/18/2020   Stage 3 chronic kidney disease ([place] 10/23/2019   Type 2 diabetes mellitus with diabetic neuropathy, unspecified ([place] 10/21/2019   Impaired gait and mobility 10/14/2019   Postural dizziness with presyncope 10/13/2019   Ventricular premature complexes 10/13/2019   Urge urinary incontinence 09/26/2019   Depressed mood 09/26/2019   Advanced care planning/counseling discussion 09/22/2018   Weakness of both lower extremities 09/22/2018   History of hepatitis 01/05/2018   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   NAFLD (nonalcoholic fatty liver disease) 09/30/2017   Obesity, Class I, BMI 30.0-34.9 (see actual BMI) 09/18/2017   Polyarthralgia 02/15/2017   OSA (obstructive sleep apnea) 02/15/2017   Partial epilepsy with impairment of consciousness ([place] 01/29/2016   Low vitamin B12 level 05/27/2015  Elevated PSA 05/27/2015   Vitamin D deficiency 03/02/2015   Other long term (current) drug therapy 03/12/2012   Encounter for general adult medical examination with abnormal findings 04/26/2011   TOTAL KNEE REPLACEMENT, LEFT, HX OF 04/07/2010   Type 2 diabetes mellitus with diabetic nephropathy ([employer]) 03/30/2010   Hyperlipidemia associated with type 2 diabetes mellitus ([place]) 03/30/2010   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 ([place]) 03/30/2010   Essential hypertension, benign 03/30/2010    ONSET DATE: 05/2021  REFERRING DIAG: G20.C (ICD-10-CM) -  Parkinsonism, unspecified   THERAPY DIAG:  Abnormality of gait and mobility  Difficulty in walking, not elsewhere classified  Muscle weakness (generalized)  Other abnormalities of gait and mobility  Unsteadiness on feet  Repeated falls  Rationale for Evaluation and Treatment: Rehabilitation  SUBJECTIVE:                                                                                                                                                                                             SUBJECTIVE STATEMENT:    Pt reports he has been doing "leg stretches" at home.  Pt demonstrates doing LAQ, but unable to recall any other exercise.    Pt accompanied by: significant other  PERTINENT HISTORY: Diabetic neuropathy, atypical PD which MD thinks could be PSP, pt reports diagnosis is parkinson's disease.    PAIN:  Are you having pain? No  PRECAUTIONS: Fall  WEIGHT BEARING RESTRICTIONS: No  FALLS: Has patient fallen in last 6 months? Yes. Number of falls 1 fall when he slippe dand fell getting out of the shower.   LIVING ENVIRONMENT: Lives with: lives with their family and lives with their spouse Lives in: House/apartment Stairs: Yes: Internal: 54, 2 sets steps; on right going up and External: 5 steps; on right going up Has following equipment at home: Single point cane, Walker - 2 wheeled, shower chair, and elevated commode  PLOF: Independent with basic ADLs, Independent with household mobility with device, Independent with community mobility with device, and Needs assistance with homemaking  PATIENT GOALS: Lower and upper body strength as well as balance   OBJECTIVE:   DIAGNOSTIC FINDINGS: No significant findings   COGNITION: Overall cognitive status:  A little flat but otherwise WNL    SENSATION: Not tested  COORDINATION: Not tested   EDEMA:  None   MUSCLE TONE: Not tested, if in supine may be good to assess if pt has rigidity      POSTURE: rounded  shoulders, forward head, and increased thoracic kyphosis  LOWER EXTREMITY ROM:   WNL in seated, in standing pt unable to obtain full erect posture.  LOWER EXTREMITY MMT:    MMT Right Eval Left Eval  Hip flexion 4 4-  Hip extension    Hip abduction 4+ 4+  Hip adduction 4 4  Hip internal rotation    Hip external rotation    Knee flexion 4+ 4+  Knee extension 4+ 4+  Ankle dorsiflexion 4+ 4+  Ankle plantarflexion 5 5  Ankle inversion    Ankle eversion    (Blank rows = not tested) All tested in seated position   BED MOBILITY:  Not tested at eval   TRANSFERS: Assistive device utilized:  UE and cane    Sit to stand: CGA Stand to sit: CGA Chair to chair: CGA Floor:  Not indicated for testing     CURB: Not tested   STAIRS: Not tested at eval, to look at in future assessments    GAIT: Gait pattern: step through pattern, decreased stride length, knee flexed in stance- Right, knee flexed in stance- Left, shuffling, and trunk flexed Distance walked: 30 feet  Assistive device utilized: Single point cane Level of assistance: CGA Comments: uses walls and other ibjects in clinic for steadying   FUNCTIONAL TESTS:  5 times sit to stand: 73.7 sec with significant UE assist and unable to stand in full erect posture  Timed up and go (TUG): 42.45 sec with SPC and with UE assist on STS transfer 10 meter walk test: 57.80 with bariatric walker Berg Balance Scale: 33/56   PATIENT SURVEYS:  FOTO 40  TODAY'S TREATMENT:                                                                                                                              DATE: 10/20/22   TherEx:  Seated LAQ, x10 each LE, First 5 normal, second set of 5 holding for 3 sec at end range Seated marching, x10 each LE Seated hamstring curls, GTB, x15 each LE Seated hip adduction into yellow physioball, 3 sec hold, x10 Standing hip extension at wall supported by forearms, 2x10 with focus on glute  activation   TherAct:  Ambulation with bariatric walker, x2 laps total with seated rest break in between(150' x2) with verbal and visual feedback for proper posture while performing, pt very fatigued following   Ambulation from seated position to doorway and transferred to transport chair     PATIENT EDUCATION: Education details: POC  Person educated: Patient and Spouse Education method: Explanation Education comprehension: verbalized understanding  HOME EXERCISE PROGRAM: To begin next session.   GOALS: Goals reviewed with patient? Yes  SHORT TERM GOALS: Target date: 11/02/2022  Patient will be independent in home exercise program to improve strength/mobility for better functional independence with ADLs. Baseline: No HEP currently  Goal status: INITIAL   LONG TERM GOALS: Target date: 12/28/2022  1.  Patient (> 69 years old) will complete five times sit to stand test in < 30 seconds indicating an increased LE strength and improved balance. Baseline: 73.7 sec  with significant UE assistance and not able to obtain full erect posture in standing  Goal status: INITIAL  2.  Patient will increase FOTO score to equal to or greater than  47   to demonstrate statistically significant improvement in mobility and quality of life.  Baseline: 40 Goal status: INITIAL   3.  Patient will increase Berg Balance score by > 6 points to demonstrate decreased fall risk during functional activities. Baseline: 33/56 Goal status: INITIAL   4.  Patient will reduce timed up and go to <25 seconds with LRAD to reduce fall risk and demonstrate improved transfer/gait ability. Baseline: 42.45 with SPC and UE assist with stand  Goal status: INITIAL  5.  Patient will increase 10 meter walk test to >1.93ms as to improve gait speed for better community ambulation and to reduce fall risk. Baseline: 57.80 sec with bariatric walker Goal status: INITIAL       ASSESSMENT:  CLINICAL IMPRESSION:  Pt  performed well today and put forth good effort throughout the session with wife in attendance.  Pt still has difficulty with postural correction, continuing to demonstrate forward flexed posture at the hips.  Pt given exercises to focus on and continued during at home time.  Pt encouraged to continue to mobilize at home as well.   Pt will continue to benefit from skilled therapy to address remaining deficits in order to improve overall QoL and return to PLOF.     OBJECTIVE IMPAIRMENTS: Abnormal gait, decreased activity tolerance, decreased balance, decreased endurance, decreased knowledge of use of DME, decreased mobility, difficulty walking, decreased strength, decreased safety awareness, impaired perceived functional ability, and improper body mechanics.   ACTIVITY LIMITATIONS: bending, sitting, standing, squatting, stairs, transfers, bed mobility, bathing, toileting, and locomotion level  PARTICIPATION LIMITATIONS:  Pt reports no participation restrictions when prompted   PERSONAL FACTORS: 3+ comorbidities: HTN, depressed mood ( chart), T2DM  are also affecting patient's functional outcome.   REHAB POTENTIAL: Fair secondary to current functional level and progressive aspect of diagnosis   CLINICAL DECISION MAKING: Evolving/moderate complexity  EVALUATION COMPLEXITY: Moderate  PLAN:  PT FREQUENCY: 2x/week  PT DURATION: 12 weeks  PLANNED INTERVENTIONS: Therapeutic exercises, Therapeutic activity, Neuromuscular re-education, Balance training, Gait training, Patient/Family education, Self Care, Joint mobilization, Stair training, and Manual therapy   PLAN FOR NEXT SESSION:   Hip flexor stretching at the stairs, continued ambulation with use of the bariatric walker, strengthening of the LE's likely in a seated position due to fatigue levels in standing.  Try to perform prone stretch for hip flexors.    JGwenlyn Saran PT, DPT Physical Therapist - [facility] 10/20/22, 5:43 PM

## 2022-10-22 ENCOUNTER — Other Ambulatory Visit: Payer: Self-pay | Admitting: Family Medicine

## 2022-10-22 DIAGNOSIS — E1121 Type 2 diabetes mellitus with diabetic nephropathy: Secondary | ICD-10-CM

## 2022-10-22 DIAGNOSIS — E1169 Type 2 diabetes mellitus with other specified complication: Secondary | ICD-10-CM

## 2022-10-22 DIAGNOSIS — E785 Hyperlipidemia, unspecified: Secondary | ICD-10-CM

## 2022-10-22 DIAGNOSIS — I10 Essential (primary) hypertension: Secondary | ICD-10-CM

## 2022-10-23 ENCOUNTER — Other Ambulatory Visit: Payer: Self-pay | Admitting: Family Medicine

## 2022-10-23 DIAGNOSIS — E1121 Type 2 diabetes mellitus with diabetic nephropathy: Secondary | ICD-10-CM

## 2022-10-25 ENCOUNTER — Ambulatory Visit: Payer: 59 | Attending: Neurology | Admitting: Physical Therapy

## 2022-10-25 DIAGNOSIS — R2689 Other abnormalities of gait and mobility: Secondary | ICD-10-CM | POA: Insufficient documentation

## 2022-10-25 DIAGNOSIS — M6281 Muscle weakness (generalized): Secondary | ICD-10-CM | POA: Diagnosis present

## 2022-10-25 DIAGNOSIS — R262 Difficulty in walking, not elsewhere classified: Secondary | ICD-10-CM | POA: Insufficient documentation

## 2022-10-25 DIAGNOSIS — R296 Repeated falls: Secondary | ICD-10-CM | POA: Insufficient documentation

## 2022-10-25 DIAGNOSIS — R2681 Unsteadiness on feet: Secondary | ICD-10-CM | POA: Insufficient documentation

## 2022-10-25 DIAGNOSIS — R293 Abnormal posture: Secondary | ICD-10-CM | POA: Insufficient documentation

## 2022-10-25 DIAGNOSIS — R269 Unspecified abnormalities of gait and mobility: Secondary | ICD-10-CM | POA: Diagnosis present

## 2022-10-25 NOTE — Therapy (Signed)
OUTPATIENT PHYSICAL THERAPY NEURO TREATMENT   Patient Name: Kirk Bennett. MRN: [id number] DOB:18-Oct-1950, 72 y.o., male Today's Date: 10/25/2022   PCP: Ria Bush, MD  REFERRING PROVIDER: Jeanette Caprice,  MD    END OF SESSION:  PT End of Session - 10/25/22 1521     Visit Number 6    Number of Visits 24    Date for PT Re-Evaluation 12/28/22    PT Start Time [id number]    PT Stop Time 1603    PT Time Calculation (min) 42 min    Equipment Utilized During Treatment Gait belt    Activity Tolerance Patient tolerated treatment well;Patient limited by fatigue    Behavior During Therapy WFL for tasks assessed/performed             Past Medical History:  Diagnosis Date   Cataracts, bilateral 02/2011   and suspected glaucoma, to return for f/u, no diabetic retinopathy   CKD stage 3 due to type 2 diabetes mellitus ([place]) 2015   normal renal US, self referred to Dr Holley Raring   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 Gabriel Carina endo --> 02/2016 decided to return to PCP for DM care   Past Surgical History:  Procedure Laterality Date   CARDIAC CATHETERIZATION  02/19/2009   No blockages (Dr. 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   Irregular heart beat 07/19/2021   BPH (benign prostatic hyperplasia) 10/03/2020   Pain due to onychomycosis of toenails of both feet 06/18/2020   Stage 3 chronic kidney disease ([place] 10/23/2019   Type 2 diabetes mellitus with diabetic neuropathy, unspecified ([place] 10/21/2019   Impaired gait and mobility 10/14/2019   Postural dizziness with presyncope 10/13/2019   Ventricular premature complexes 10/13/2019   Urge urinary incontinence 09/26/2019   Depressed mood 09/26/2019   Advanced care planning/counseling discussion 09/22/2018   Weakness of both lower extremities 09/22/2018   History of hepatitis 01/05/2018   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   NAFLD (nonalcoholic fatty liver disease) 09/30/2017   Obesity, Class I, BMI 30.0-34.9 (see actual BMI) 09/18/2017   Polyarthralgia 02/15/2017   OSA (obstructive sleep apnea) 02/15/2017   Partial epilepsy with impairment of consciousness ([place] 01/29/2016   Low vitamin B12 level 05/27/2015  Elevated PSA 05/27/2015   Vitamin D deficiency 03/02/2015   Other long term (current) drug therapy 03/12/2012   Encounter for general adult medical examination with abnormal findings 04/26/2011   TOTAL KNEE REPLACEMENT, LEFT, HX OF 04/07/2010   Type 2 diabetes mellitus with diabetic nephropathy ([place]) 03/30/2010   Hyperlipidemia associated with type 2 diabetes mellitus ([place]) 03/30/2010   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 ([place]) 03/30/2010   Essential hypertension, benign 03/30/2010    ONSET DATE: 05/2021  REFERRING DIAG: G20.C (ICD-10-CM) - Parkinsonism,  unspecified   THERAPY DIAG:  Abnormality of gait and mobility  Difficulty in walking, not elsewhere classified  Muscle weakness (generalized)  Other abnormalities of gait and mobility  Unsteadiness on feet  Repeated falls  Abnormal posture  Rationale for Evaluation and Treatment: Rehabilitation  SUBJECTIVE:                                                                                                                                                                                             SUBJECTIVE STATEMENT:    Pt reports that he is doing well. He says that he has been trying to "stay in shape" and states that he has been doing some exercises in the morning, but unable to recall which exercises.    Pt accompanied by: significant other  PERTINENT HISTORY: Diabetic neuropathy, atypical PD which MD thinks could be PSP, pt reports diagnosis is parkinson's disease.    PAIN:  Are you having pain? No  PRECAUTIONS: Fall  WEIGHT BEARING RESTRICTIONS: No  FALLS: Has patient fallen in last 6 months? Yes. Number of falls 1 fall when he slippe dand fell getting out of the shower.   LIVING ENVIRONMENT: Lives with: lives with their family and lives with their spouse Lives in: House/apartment Stairs: Yes: Internal: 29, 2 sets steps; on right going up and External: 5 steps; on right going up Has following equipment at home: Single point cane, Walker - 2 wheeled, shower chair, and elevated commode  PLOF: Independent with basic ADLs, Independent with household mobility with device, Independent with community mobility with device, and Needs assistance with homemaking  PATIENT GOALS: Lower and upper body strength as well as balance   OBJECTIVE:   DIAGNOSTIC FINDINGS: No significant findings   COGNITION: Overall cognitive status:  A little flat but otherwise WNL    SENSATION: Not tested  COORDINATION: Not tested   EDEMA:  None   MUSCLE TONE: Not tested, if in supine  may be good to assess if pt has rigidity      POSTURE: rounded shoulders, forward head, and increased thoracic kyphosis  LOWER EXTREMITY ROM:   WNL in seated, in standing pt unable to obtain full erect posture.    LOWER EXTREMITY MMT:    MMT Right Eval Left Eval  Hip flexion 4 4-  Hip extension    Hip abduction 4+ 4+  Hip adduction 4 4  Hip internal rotation    Hip external rotation    Knee flexion 4+ 4+  Knee extension 4+ 4+  Ankle dorsiflexion 4+ 4+  Ankle plantarflexion 5 5  Ankle inversion    Ankle eversion    (Blank rows = not tested) All tested in seated position   BED MOBILITY:  Not tested at eval   TRANSFERS: Assistive device utilized:  UE and cane    Sit to stand: CGA Stand to sit: CGA Chair to chair: CGA Floor:  Not indicated for testing     CURB: Not tested   STAIRS: Not tested at eval, to look at in future assessments    GAIT: Gait pattern: step through pattern, decreased stride length, knee flexed in stance- Right, knee flexed in stance- Left, shuffling, and trunk flexed Distance walked: 30 feet  Assistive device utilized: Single point cane Level of assistance: CGA Comments: uses walls and other ibjects in clinic for steadying   FUNCTIONAL TESTS:  5 times sit to stand: 73.7 sec with significant UE assist and unable to stand in full erect posture  Timed up and go (TUG): 42.45 sec with SPC and with UE assist on STS transfer 10 meter walk test: 57.80 with bariatric walker Berg Balance Scale: 33/56   PATIENT SURVEYS:  FOTO 40  TODAY'S TREATMENT:                                                                                                                              DATE: 10/25/22   Gait training:  Pt performed gait training with bariatric RW x 161f with CGA for safety and to facilitate improved pelvic rotation to increase step length on BLE as well as cues to keep BLE within RW and improve hip extension to reduce flexed posture.   Additional gait training through rehab gym with RW and min assist from PT for wegith shift to the R to allow improved step length on the R consistently x 664fwith    TherEx: Pt performed 5 time sit<>stand (5xSTS): 28.3 sec and 27.4sec (>15 sec indicates increased fall risk) min assist from PT from PT to facilitate adequate weight shift on second bout. Heavy UE use to push from chair arm rests.   Standing hip flexor stretch 2 x 30 sec bil with BUE supported on parallel bars.  LAQ with blue tband x 10 BLE  Seated Hip abduction with blue tband x 10 BLE with 3 sec hold      PATIENT EDUCATION: Education details: POC  Person educated: Patient and Spouse Education method: Explanation Education comprehension: verbalized understanding  HOME EXERCISE PROGRAM: Access Code: [email or web address] URL: [email or web address] Date:  10/25/2022 Prepared by: Barrie Folk  Exercises - Seated Hip Abduction with Pelvic Floor Contraction and Resistance Loop  - 1 x daily - 7 x weekly - 3 sets - 10 reps - Sitting Knee Extension with Resistance  - 1 x daily - 7 x weekly - 3 sets - 10 reps - Standing Hip Flexor Stretch  - 1 x daily - 7 x weekly - 3 sets - 10 reps  GOALS: Goals reviewed with patient? Yes  SHORT TERM GOALS: Target date: 11/02/2022  Patient will be independent in home exercise program to improve strength/mobility for better functional independence with ADLs. Baseline: No HEP currently  Goal status: INITIAL   LONG TERM GOALS: Target date: 12/28/2022  1.  Patient (> 47 years old) will complete five times sit to stand test in < 30 seconds indicating an increased LE strength and improved balance. Baseline: 73.7 sec with significant UE assistance and not able to obtain full erect posture in standing  Goal status: INITIAL  2.  Patient will increase FOTO score to equal to or greater than  47   to demonstrate statistically significant improvement in mobility and quality of life.   Baseline: 40 Goal status: INITIAL   3.  Patient will increase Berg Balance score by > 6 points to demonstrate decreased fall risk during functional activities. Baseline: 33/56 Goal status: INITIAL   4.  Patient will reduce timed up and go to <25 seconds with LRAD to reduce fall risk and demonstrate improved transfer/gait ability. Baseline: 42.45 with SPC and UE assist with stand  Goal status: INITIAL  5.  Patient will increase 10 meter walk test to >1.57ms as to improve gait speed for better community ambulation and to reduce fall risk. Baseline: 57.80 sec with bariatric walker Goal status: INITIAL    ASSESSMENT:  CLINICAL IMPRESSION:  Pt participated well in treatment on this day. Wife elected to no attend session. Demonstrated  improved attention and strength/power in BLE with progression in 5xSTS to ~28 sec. Pt given HEP and demonstrated understanding of movements. Pt encouraged to continue to mobilize at home as well.   Pt will continue to benefit from skilled therapy to address remaining deficits in order to improve overall QoL and return to PLOF.    OBJECTIVE IMPAIRMENTS: Abnormal gait, decreased activity tolerance, decreased balance, decreased endurance, decreased knowledge of use of DME, decreased mobility, difficulty walking, decreased strength, decreased safety awareness, impaired perceived functional ability, and improper body mechanics.   ACTIVITY LIMITATIONS: bending, sitting, standing, squatting, stairs, transfers, bed mobility, bathing, toileting, and locomotion level  PARTICIPATION LIMITATIONS:  Pt reports no participation restrictions when prompted   PERSONAL FACTORS: 3+ comorbidities: HTN, depressed mood ( chart), T2DM  are also affecting patient's functional outcome.   REHAB POTENTIAL: Fair secondary to current functional level and progressive aspect of diagnosis   CLINICAL DECISION MAKING: Evolving/moderate complexity  EVALUATION COMPLEXITY:  Moderate  PLAN:  PT FREQUENCY: 2x/week  PT DURATION: 12 weeks  PLANNED INTERVENTIONS: Therapeutic exercises, Therapeutic activity, Neuromuscular re-education, Balance training, Gait training, Patient/Family education, Self Care, Joint mobilization, Stair training, and Manual therapy   PLAN FOR NEXT SESSION:   Endurance training. Continue BLE strengthening and balance training. Try to perform prone stretch for hip flexors.   ABarrie FolkPT, DPT  Physical Therapist - [facility] 10/25/22, 5:01 PM

## 2022-10-27 ENCOUNTER — Ambulatory Visit: Payer: 59

## 2022-10-27 DIAGNOSIS — R269 Unspecified abnormalities of gait and mobility: Secondary | ICD-10-CM | POA: Diagnosis not present

## 2022-10-27 DIAGNOSIS — R2681 Unsteadiness on feet: Secondary | ICD-10-CM

## 2022-10-27 DIAGNOSIS — R296 Repeated falls: Secondary | ICD-10-CM

## 2022-10-27 DIAGNOSIS — R262 Difficulty in walking, not elsewhere classified: Secondary | ICD-10-CM

## 2022-10-27 DIAGNOSIS — R293 Abnormal posture: Secondary | ICD-10-CM

## 2022-10-27 DIAGNOSIS — R2689 Other abnormalities of gait and mobility: Secondary | ICD-10-CM

## 2022-10-27 DIAGNOSIS — M6281 Muscle weakness (generalized): Secondary | ICD-10-CM

## 2022-10-27 NOTE — Therapy (Signed)
OUTPATIENT PHYSICAL THERAPY NEURO TREATMENT   Patient Name: Kirk Bennett. MRN: [id number] DOB:01/08/1951, 72 y.o., male Today's Date: 10/27/2022   PCP: Ria Bush, MD  REFERRING PROVIDER: Jeanette Caprice,  MD    END OF SESSION:  PT End of Session - 10/27/22 0943     Visit Number 7    Number of Visits 24    Date for PT Re-Evaluation 12/28/22    PT Start Time 0943    PT Stop Time 1024    PT Time Calculation (min) 41 min    Equipment Utilized During Treatment Gait belt    Activity Tolerance Patient tolerated treatment well;Patient limited by fatigue    Behavior During Therapy [facility] for tasks assessed/performed             Past Medical History:  Diagnosis Date   Cataracts, bilateral 02/2011   and suspected glaucoma, to return for f/u, no diabetic retinopathy   CKD stage 3 due to type 2 diabetes mellitus ([place]) 2015   normal renal US, self referred to Dr Holley Raring   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 Gabriel Carina endo --> 02/2016 decided to return to PCP for DM care   Past Surgical History:  Procedure Laterality Date   CARDIAC CATHETERIZATION  02/19/2009   No blockages (Dr. Liliane Shi)   COLONOSCOPY  09/16/2005   hyperplastic polyps, rpt due 10 yrs    COLONOSCOPY WITH PROPOFOL N/A 12/11/2015   mult polyps, few TA, rpt 66yr ([place])   COLONOSCOPY WITH PROPOFOL N/A 09/02/2021   TAs, HPs, int hem, distal ileal polyposis - benign, rpt yrs([place], SRichardo Hanks DO)   corrective surgery amblyopia  08/23/1955   Cystectomy or meningioma removal brain  08/22/1988   (unclear)   Left knee surgery  08/22/1969   Torn ACL   REPLACEMENT TOTAL KNEE  11/20/2009   Left ([facility] Dr. 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   Irregular heart beat 07/19/2021   BPH (benign prostatic hyperplasia) 10/03/2020   Pain due to onychomycosis of toenails of both feet 06/18/2020   Stage 3 chronic kidney disease ([place] 10/23/2019   Type 2 diabetes mellitus with diabetic neuropathy, unspecified ([place] 10/21/2019   Impaired gait and mobility 10/14/2019   Postural dizziness with presyncope 10/13/2019   Ventricular premature complexes 10/13/2019   Urge urinary incontinence 09/26/2019   Depressed mood 09/26/2019   Advanced care planning/counseling discussion 09/22/2018   Weakness of both lower extremities 09/22/2018   History of hepatitis 01/05/2018   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   NAFLD (nonalcoholic fatty liver disease) 09/30/2017   Obesity, Class I, BMI 30.0-34.9 (see actual BMI) 09/18/2017   Polyarthralgia 02/15/2017   OSA (obstructive sleep apnea) 02/15/2017   Partial epilepsy with impairment of consciousness ([place] 01/29/2016   Low vitamin B12 level 05/27/2015  Elevated PSA 05/27/2015   Vitamin D deficiency 03/02/2015   Other long term (current) drug therapy 03/12/2012   Encounter for general adult medical examination with abnormal findings 04/26/2011   TOTAL KNEE REPLACEMENT, LEFT, HX OF 04/07/2010   Type 2 diabetes mellitus with diabetic nephropathy ([place]) 03/30/2010   Hyperlipidemia associated with type 2 diabetes mellitus ([place]) 03/30/2010   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 ([place]) 03/30/2010   Essential hypertension, benign 03/30/2010    ONSET DATE: 05/2021  REFERRING DIAG: G20.C (ICD-10-CM) - Parkinsonism,  unspecified   THERAPY DIAG:  Abnormality of gait and mobility  Difficulty in walking, not elsewhere classified  Muscle weakness (generalized)  Other abnormalities of gait and mobility  Unsteadiness on feet  Repeated falls  Abnormal posture  Rationale for Evaluation and Treatment: Rehabilitation  SUBJECTIVE:                                                                                                                                                                                             SUBJECTIVE STATEMENT:   Pt reports he is ready to begin therapy and notes his late arrival was due to his wife.   Pt accompanied by: significant other  PERTINENT HISTORY: Diabetic neuropathy, atypical PD which MD thinks could be PSP, pt reports diagnosis is parkinson's disease.    PAIN:  Are you having pain? No  PRECAUTIONS: Fall  WEIGHT BEARING RESTRICTIONS: No  FALLS: Has patient fallen in last 6 months? Yes. Number of falls 1 fall when he slippe dand fell getting out of the shower.   LIVING ENVIRONMENT: Lives with: lives with their family and lives with their spouse Lives in: House/apartment Stairs: Yes: Internal: 60, 2 sets steps; on right going up and External: 5 steps; on right going up Has following equipment at home: Single point cane, Walker - 2 wheeled, shower chair, and elevated commode  PLOF: Independent with basic ADLs, Independent with household mobility with device, Independent with community mobility with device, and Needs assistance with homemaking  PATIENT GOALS: Lower and upper body strength as well as balance   OBJECTIVE:   DIAGNOSTIC FINDINGS: No significant findings   COGNITION: Overall cognitive status:  A little flat but otherwise WNL    SENSATION: Not tested  COORDINATION: Not tested   EDEMA:  None   MUSCLE TONE: Not tested, if in supine may be good to assess if pt has rigidity    POSTURE: rounded shoulders, forward head, and increased  thoracic kyphosis  LOWER EXTREMITY ROM:   WNL in seated, in standing pt unable to obtain full erect posture.    LOWER EXTREMITY  MMT:    MMT Right Eval Left Eval  Hip flexion 4 4-  Hip extension    Hip abduction 4+ 4+  Hip adduction 4 4  Hip internal rotation    Hip external rotation    Knee flexion 4+ 4+  Knee extension 4+ 4+  Ankle dorsiflexion 4+ 4+  Ankle plantarflexion 5 5  Ankle inversion    Ankle eversion    (Blank rows = not tested) All tested in seated position   BED MOBILITY:  Not tested at eval   TRANSFERS: Assistive device utilized:  UE and cane    Sit to stand: CGA Stand to sit: CGA Chair to chair: CGA Floor:  Not indicated for testing     CURB: Not tested   STAIRS: Not tested at eval, to look at in future assessments    GAIT: Gait pattern: step through pattern, decreased stride length, knee flexed in stance- Right, knee flexed in stance- Left, shuffling, and trunk flexed Distance walked: 30 feet  Assistive device utilized: Single point cane Level of assistance: CGA Comments: uses walls and other ibjects in clinic for steadying   FUNCTIONAL TESTS:  5 times sit to stand: 73.7 sec with significant UE assist and unable to stand in full erect posture  Timed up and go (TUG): 42.45 sec with SPC and with UE assist on STS transfer 10 meter walk test: 57.80 with bariatric walker Berg Balance Scale: 33/56   PATIENT SURVEYS:  FOTO 40  TODAY'S TREATMENT: DATE: 10/27/22   Gait training:  Pt performed gait training with bariatric RW x 169f with CGA for safety and to facilitate improved pelvic rotation to increase step length on BLE as well as cues to keep BLE within RW and improve hip extension to reduce flexed posture.    TherEx:  Seated resisted hip abduction into BTB, 3 sec holds, 2x15 Seated hamstring curls with BTB, 2x15 with verbal cues for slowed contraction and pt getting somewhat irritated Seated LAQ with BTB around distal lower legs,  2x15 Seated resisted marches with BTB placed at feet, 2x15 STS x5 in standing chair with verbal cuing for no UE use and proper form including sliding forward and keeping feet back; pt had increased difficulty performing so transitioned to elevated plinth STS x10 from elevated plinth, lowering with each subsequent attempt    PATIENT EDUCATION: Education details: POC  Person educated: Patient and Spouse Education method: Explanation Education comprehension: verbalized understanding  HOME EXERCISE PROGRAM: Access Code: [email or web address] URL: [email or web address] Date: 10/25/2022 Prepared by: ABarrie Folk Exercises - Seated Hip Abduction with Pelvic Floor Contraction and Resistance Loop  - 1 x daily - 7 x weekly - 3 sets - 10 reps - Sitting Knee Extension with Resistance  - 1 x daily - 7 x weekly - 3 sets - 10 reps - Standing Hip Flexor Stretch  - 1 x daily - 7 x weekly - 3 sets - 10 reps  GOALS: Goals reviewed with patient? Yes  SHORT TERM GOALS: Target date: 11/02/2022  Patient will be independent in home exercise program to improve strength/mobility for better functional independence with ADLs. Baseline: No HEP currently  Goal status: INITIAL   LONG TERM GOALS: Target date: 12/28/2022  1.  Patient (> 819years old) will complete five times sit to stand test in < 30 seconds indicating an increased LE strength and improved balance. Baseline: 73.7 sec with significant UE assistance and not able to obtain full erect posture in standing  Goal status: INITIAL  2.  Patient will increase FOTO score to equal to or greater than  47   to demonstrate statistically significant improvement in mobility and quality of life.  Baseline: 40 Goal status: INITIAL   3.  Patient will increase Berg Balance score by > 6 points to demonstrate decreased fall risk during functional activities. Baseline: 33/56 Goal status: INITIAL   4.  Patient will reduce timed up and go to <25 seconds with  LRAD to reduce fall risk and demonstrate improved transfer/gait ability. Baseline: 42.45 with SPC and UE assist with stand  Goal status: INITIAL  5.  Patient will increase 10 meter walk test to >1.42ms as to improve gait speed for better community ambulation and to reduce fall risk. Baseline: 57.80 sec with bariatric walker Goal status: INITIAL    ASSESSMENT:  CLINICAL IMPRESSION:  Pt somewhat antagonistic with poor safety choice throughout the session.  Pt educated on importance of having shoes tied prior to ambulation, however pt disagreed.  Pt also given proper education for ambulating to the chair and feeling prior to sitting and the proper form for standing from a seated position.  Pt will continue to benefit from skilled therapy to address the deficits and benefit from consistent safety awareness training.    OBJECTIVE IMPAIRMENTS: Abnormal gait, decreased activity tolerance, decreased balance, decreased endurance, decreased knowledge of use of DME, decreased mobility, difficulty walking, decreased strength, decreased safety awareness, impaired perceived functional ability, and improper body mechanics.   ACTIVITY LIMITATIONS: bending, sitting, standing, squatting, stairs, transfers, bed mobility, bathing, toileting, and locomotion level  PARTICIPATION LIMITATIONS:  Pt reports no participation restrictions when prompted   PERSONAL FACTORS: 3+ comorbidities: HTN, depressed mood ( chart), T2DM  are also affecting patient's functional outcome.   REHAB POTENTIAL: Fair secondary to current functional level and progressive aspect of diagnosis   CLINICAL DECISION MAKING: Evolving/moderate complexity  EVALUATION COMPLEXITY: Moderate  PLAN:  PT FREQUENCY: 2x/week  PT DURATION: 12 weeks  PLANNED INTERVENTIONS: Therapeutic exercises, Therapeutic activity, Neuromuscular re-education, Balance training, Gait training, Patient/Family education, Self Care, Joint mobilization, Stair training,  and Manual therapy   PLAN FOR NEXT SESSION:   Endurance training. Continue BLE strengthening and balance training. Try to perform prone stretch for hip flexors.    JGwenlyn Saran PT, DPT Physical Therapist - [facility] 10/27/22, 11:43 AM

## 2022-10-27 NOTE — Therapy (Signed)
OUTPATIENT PHYSICAL THERAPY NEURO TREATMENT   Patient Name: Kirk Bennett. 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 Gabriel Carina endo --> 02/2016 decided to return to PCP for DM care   Past Surgical History:  Procedure Laterality Date   CARDIAC CATHETERIZATION  02/19/2009   No blockages (Dr. 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   Irregular heart beat 07/19/2021   BPH  (benign prostatic hyperplasia) 10/03/2020   Pain due to onychomycosis of toenails of both feet 06/18/2020   Stage 3 chronic kidney disease ([place] 10/23/2019   Type 2 diabetes mellitus with diabetic neuropathy, unspecified ([place] 10/21/2019   Impaired gait and mobility 10/14/2019   Postural dizziness with presyncope 10/13/2019   Ventricular premature complexes 10/13/2019   Urge urinary incontinence 09/26/2019   Depressed mood 09/26/2019   Advanced care planning/counseling discussion 09/22/2018   Weakness of both lower extremities 09/22/2018   History of hepatitis 01/05/2018   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   NAFLD (nonalcoholic fatty liver disease) 09/30/2017   Obesity, Class I, BMI 30.0-34.9 (see actual BMI) 09/18/2017   Polyarthralgia 02/15/2017   OSA (obstructive sleep apnea) 02/15/2017   Partial epilepsy with impairment of consciousness ([place] 01/29/2016   Low vitamin B12 level 05/27/2015   Elevated PSA 05/27/2015   Vitamin D deficiency 03/02/2015   Other long term (current) drug therapy 03/12/2012   Encounter for general adult medical examination with abnormal findings 04/26/2011   TOTAL KNEE REPLACEMENT, LEFT, HX OF 04/07/2010   Type 2 diabetes mellitus with diabetic nephropathy ([place] 03/30/2010   Hyperlipidemia associated with type 2 diabetes mellitus ([place] 03/30/2010   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  Rationale for Evaluation and Treatment: Rehabilitation  SUBJECTIVE:                                                                                                                                                                                             SUBJECTIVE STATEMENT:    Pt reports that he is doing well. He says that he has been  trying to "stay in shape" and states that he has been doing some exercises in the morning, but unable to recall which exercises.    Pt accompanied by: significant other  PERTINENT HISTORY: Diabetic neuropathy, atypical PD which MD thinks could be PSP, pt reports diagnosis is parkinson's disease.    PAIN:  Are you having pain? No  PRECAUTIONS: Fall  WEIGHT BEARING RESTRICTIONS: No  FALLS: Has patient fallen in last 6 months? Yes. Number of falls 1 fall when he slippe dand fell getting out of the shower.   LIVING ENVIRONMENT: Lives with: lives with their family and lives with their spouse Lives in: House/apartment Stairs: Yes: Internal: 48, 2 sets steps; on right going up and External: 5 steps; on right going up Has following equipment at home: Single point cane, Walker - 2 wheeled, shower chair, and elevated commode  PLOF: Independent with basic ADLs, Independent with household mobility with device, Independent with community mobility with device, and Needs assistance with homemaking  PATIENT GOALS: Lower and upper body strength as well as balance   OBJECTIVE:   DIAGNOSTIC FINDINGS: No significant findings   COGNITION: Overall cognitive status:  A little flat but otherwise WNL    SENSATION: Not tested  COORDINATION: Not tested   EDEMA:  None   MUSCLE TONE: Not tested, if in supine may be good to assess if pt has rigidity      POSTURE: rounded shoulders, forward head, and increased thoracic kyphosis  LOWER EXTREMITY ROM:   WNL in seated, in standing pt unable to obtain full erect posture.    LOWER EXTREMITY MMT:    MMT Right Eval Left Eval  Hip flexion 4 4-  Hip extension    Hip abduction 4+ 4+  Hip adduction 4 4  Hip internal rotation    Hip external rotation    Knee flexion 4+ 4+  Knee extension 4+ 4+  Ankle dorsiflexion 4+ 4+  Ankle plantarflexion 5 5  Ankle inversion    Ankle eversion    (Blank rows = not tested) All tested in seated position    BED MOBILITY:  Not tested at eval   TRANSFERS: Assistive device utilized:  UE and cane  Sit to stand: CGA Stand to sit: CGA Chair to chair: CGA Floor:  Not indicated for testing     CURB: Not tested   STAIRS: Not tested at eval, to look at in future assessments    GAIT: Gait pattern: step through pattern, decreased stride length, knee flexed in stance- Right, knee flexed in stance- Left, shuffling, and trunk flexed Distance walked: 30 feet  Assistive device utilized: Single point cane Level of assistance: CGA Comments: uses walls and other ibjects in clinic for steadying   FUNCTIONAL TESTS:  5 times sit to stand: 73.7 sec with significant UE assist and unable to stand in full erect posture  Timed up and go (TUG): 42.45 sec with SPC and with UE assist on STS transfer 10 meter walk test: 57.80 with bariatric walker Berg Balance Scale: 33/56   PATIENT SURVEYS:  FOTO 40  TODAY'S TREATMENT:                                                                                                                              DATE: 10/27/22   Gait training:  Pt performed gait training with bariatric RW x 189f with CGA for safety and to facilitate improved pelvic rotation to increase step length on BLE as well as cues to keep BLE within RW and improve hip extension to reduce flexed posture.  Additional gait training through rehab gym with RW and min assist from PT for wegith shift to the R to allow improved step length on the R consistently x [id number]    TherEx: Pt performed 5 time sit<>stand (5xSTS): 28.3 sec and 27.4sec (>15 sec indicates increased fall risk) min assist from PT from PT to facilitate adequate weight shift on second bout. Heavy UE use to push from chair arm rests.   Standing hip flexor stretch 2 x 30 sec bil with BUE supported on parallel bars.  LAQ with blue tband x 10 BLE  Seated Hip abduction with blue tband x 10 BLE with 3 sec hold      PATIENT  EDUCATION: Education details: POC  Person educated: Patient and Spouse Education method: Explanation Education comprehension: verbalized understanding  HOME EXERCISE PROGRAM: Access Code: [email or web address] URL: [email or web address] Date: 10/25/2022 Prepared by: AuBarrie FolkExercises - Seated Hip Abduction with Pelvic Floor Contraction and Resistance Loop  - 1 x daily - 7 x weekly - 3 sets - 10 reps - Sitting Knee Extension with Resistance  - 1 x daily - 7 x weekly - 3 sets - 10 reps - Standing Hip Flexor Stretch  - 1 x daily - 7 x weekly - 3 sets - 10 reps  GOALS: Goals reviewed with patient? Yes  SHORT TERM GOALS: Target date: 11/02/2022  Patient will be independent in home exercise program to improve strength/mobility for better functional independence with ADLs. Baseline: No HEP currently  Goal status: INITIAL   LONG TERM GOALS: Target date: 12/28/2022  1.  Patient (>  18 years old) will complete five times sit to stand test in < 30 seconds indicating an increased LE strength and improved balance. Baseline: 73.7 sec with significant UE assistance and not able to obtain full erect posture in standing  Goal status: INITIAL  2.  Patient will increase FOTO score to equal to or greater than  47   to demonstrate statistically significant improvement in mobility and quality of life.  Baseline: 40 Goal status: INITIAL   3.  Patient will increase Berg Balance score by > 6 points to demonstrate decreased fall risk during functional activities. Baseline: 33/56 Goal status: INITIAL   4.  Patient will reduce timed up and go to <25 seconds with LRAD to reduce fall risk and demonstrate improved transfer/gait ability. Baseline: 42.45 with SPC and UE assist with stand  Goal status: INITIAL  5.  Patient will increase 10 meter walk test to >1.61ms as to improve gait speed for better community ambulation and to reduce fall risk. Baseline: 57.80 sec with bariatric walker Goal  status: INITIAL    ASSESSMENT:  CLINICAL IMPRESSION:  Pt participated well in treatment on this day. Wife elected to no attend session. Demonstrated  improved attention and strength/power in BLE with progression in 5xSTS to ~28 sec. Pt given HEP and demonstrated understanding of movements. Pt encouraged to continue to mobilize at home as well.   Pt will continue to benefit from skilled therapy to address remaining deficits in order to improve overall QoL and return to PLOF.    OBJECTIVE IMPAIRMENTS: Abnormal gait, decreased activity tolerance, decreased balance, decreased endurance, decreased knowledge of use of DME, decreased mobility, difficulty walking, decreased strength, decreased safety awareness, impaired perceived functional ability, and improper body mechanics.   ACTIVITY LIMITATIONS: bending, sitting, standing, squatting, stairs, transfers, bed mobility, bathing, toileting, and locomotion level  PARTICIPATION LIMITATIONS:  Pt reports no participation restrictions when prompted   PERSONAL FACTORS: 3+ comorbidities: HTN, depressed mood ( chart), T2DM  are also affecting patient's functional outcome.   REHAB POTENTIAL: Fair secondary to current functional level and progressive aspect of diagnosis   CLINICAL DECISION MAKING: Evolving/moderate complexity  EVALUATION COMPLEXITY: Moderate  PLAN:  PT FREQUENCY: 2x/week  PT DURATION: 12 weeks  PLANNED INTERVENTIONS: Therapeutic exercises, Therapeutic activity, Neuromuscular re-education, Balance training, Gait training, Patient/Family education, Self Care, Joint mobilization, Stair training, and Manual therapy   PLAN FOR NEXT SESSION:   Endurance training. Continue BLE strengthening and balance training. Try to perform prone stretch for hip flexors.   CParticia LatherPT ,DPT Physical Therapist- [facility]  10/27/22, 9:18 AM

## 2022-11-01 ENCOUNTER — Ambulatory Visit: Payer: 59 | Admitting: Physical Therapy

## 2022-11-01 DIAGNOSIS — R2681 Unsteadiness on feet: Secondary | ICD-10-CM

## 2022-11-01 DIAGNOSIS — R269 Unspecified abnormalities of gait and mobility: Secondary | ICD-10-CM

## 2022-11-01 DIAGNOSIS — M6281 Muscle weakness (generalized): Secondary | ICD-10-CM

## 2022-11-01 DIAGNOSIS — R262 Difficulty in walking, not elsewhere classified: Secondary | ICD-10-CM

## 2022-11-01 NOTE — Therapy (Signed)
OUTPATIENT PHYSICAL THERAPY NEURO TREATMENT   Patient Name: Kirk Bennett. MRN: [id number] DOB:22-Aug-1951, 72 y.o., male Today's Date: 11/01/2022   PCP: Ria Bush, MD  REFERRING PROVIDER: Jeanette Caprice,  MD    END OF SESSION:  PT End of Session - 11/01/22 1101     Visit Number 8    Number of Visits 24    Date for PT Re-Evaluation 12/28/22    PT Start Time 1102    PT Stop Time 1147    PT Time Calculation (min) 45 min    Equipment Utilized During Treatment Gait belt    Activity Tolerance Patient tolerated treatment well;Patient limited by fatigue    Behavior During Therapy [facility] for tasks assessed/performed              Past Medical History:  Diagnosis Date   Cataracts, bilateral 02/2011   and suspected glaucoma, to return for f/u, no diabetic retinopathy   CKD stage 3 due to type 2 diabetes mellitus ([place]) 2015   normal renal US, self referred to Dr Holley Raring   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 Gabriel Carina endo --> 02/2016 decided to return to PCP for DM care   Past Surgical History:  Procedure Laterality Date   CARDIAC CATHETERIZATION  02/19/2009   No blockages (Dr. Liliane Shi)   COLONOSCOPY  09/16/2005   hyperplastic polyps, rpt due 10 yrs    COLONOSCOPY WITH PROPOFOL N/A 12/11/2015   mult polyps, few TA, rpt 79yr ([place])   COLONOSCOPY WITH PROPOFOL N/A 09/02/2021   TAs, HPs, int hem, distal ileal polyposis - benign, rpt yrs([place],  SRichardo Hanks DO)   corrective surgery amblyopia  08/23/1955   Cystectomy or meningioma removal brain  08/22/1988   (unclear)   Left knee surgery  08/22/1969   Torn ACL   REPLACEMENT TOTAL KNEE  11/20/2009   Left ([facility] Dr. 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   Irregular heart beat 07/19/2021   BPH (benign prostatic hyperplasia) 10/03/2020   Pain due to onychomycosis of toenails of both feet 06/18/2020   Stage 3 chronic kidney disease ([place] 10/23/2019   Type 2 diabetes mellitus with diabetic neuropathy, unspecified ([place] 10/21/2019   Impaired gait and mobility 10/14/2019   Postural dizziness with presyncope 10/13/2019   Ventricular premature complexes 10/13/2019   Urge urinary incontinence 09/26/2019   Depressed mood 09/26/2019   Advanced care planning/counseling discussion 09/22/2018   Weakness of both lower extremities 09/22/2018   History of hepatitis 01/05/2018   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   NAFLD (nonalcoholic fatty liver disease) 09/30/2017   Obesity, Class I, BMI 30.0-34.9 (see actual BMI) 09/18/2017   Polyarthralgia 02/15/2017   OSA (obstructive sleep apnea) 02/15/2017   Partial epilepsy with impairment of consciousness ([place] 01/29/2016   Low vitamin B12 level 05/27/2015  Elevated PSA 05/27/2015   Vitamin D deficiency 03/02/2015   Other long term (current) drug therapy 03/12/2012   Encounter for general adult medical examination with abnormal findings 04/26/2011   TOTAL KNEE REPLACEMENT, LEFT, HX OF 04/07/2010   Type 2 diabetes mellitus with diabetic nephropathy ([place]) 03/30/2010   Hyperlipidemia associated with type 2 diabetes mellitus ([place]) 03/30/2010   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 ([place]) 03/30/2010   Essential hypertension, benign 03/30/2010    ONSET DATE: 05/2021  REFERRING DIAG: G20.C (ICD-10-CM) -  Parkinsonism, unspecified   THERAPY DIAG:  Abnormality of gait and mobility  Difficulty in walking, not elsewhere classified  Muscle weakness (generalized)  Unsteadiness on feet  Rationale for Evaluation and Treatment: Rehabilitation  SUBJECTIVE:                                                                                                                                                                                             SUBJECTIVE STATEMENT:   Pt's wife reports that pt had a great weekend. States that he was up and walking around the house with no assistive device with improved posture several times to and from living room and kitchen. Reports that he has felt more steady on feet. Wife states that she has added Vitam B12 and Vitam D supplements back into medication regiment over the past week.     Pt accompanied by: significant other  PERTINENT HISTORY: Diabetic neuropathy, atypical PD which MD thinks could be PSP, pt reports diagnosis is parkinson's disease.    PAIN:  Are you having pain? No  PRECAUTIONS: Fall  WEIGHT BEARING RESTRICTIONS: No  FALLS: Has patient fallen in last 6 months? Yes. Number of falls 1 fall when he slippe dand fell getting out of the shower.   LIVING ENVIRONMENT: Lives with: lives with their family and lives with their spouse Lives in: House/apartment Stairs: Yes: Internal: 79, 2 sets steps; on right going up and External: 5 steps; on right going up Has following equipment at home: Single point cane, Walker - 2 wheeled, shower chair, and elevated commode  PLOF: Independent with basic ADLs, Independent with household mobility with device, Independent with community mobility with device, and Needs assistance with homemaking  PATIENT GOALS: Lower and upper body strength as well as balance   OBJECTIVE:   DIAGNOSTIC FINDINGS: No significant findings   COGNITION: Overall cognitive status:  A little flat but otherwise WNL     SENSATION: Not tested  COORDINATION: Not tested   EDEMA:  None   MUSCLE TONE: Not tested, if in supine may be good to assess if pt has rigidity  POSTURE: rounded shoulders, forward head, and increased thoracic kyphosis  LOWER EXTREMITY ROM:   WNL in seated, in standing pt unable to obtain full erect posture.    LOWER EXTREMITY MMT:    MMT Right Eval Left Eval  Hip flexion 4 4-  Hip extension    Hip abduction 4+ 4+  Hip adduction 4 4  Hip internal rotation    Hip external rotation    Knee flexion 4+ 4+  Knee extension 4+ 4+  Ankle dorsiflexion 4+ 4+  Ankle plantarflexion 5 5  Ankle inversion    Ankle eversion    (Blank rows = not tested) All tested in seated position   BED MOBILITY:  Not tested at eval   TRANSFERS: Assistive device utilized:  UE and cane    Sit to stand: CGA Stand to sit: CGA Chair to chair: CGA Floor:  Not indicated for testing     CURB: Not tested   STAIRS: Not tested at eval, to look at in future assessments    GAIT: Gait pattern: step through pattern, decreased stride length, knee flexed in stance- Right, knee flexed in stance- Left, shuffling, and trunk flexed Distance walked: 30 feet  Assistive device utilized: Single point cane Level of assistance: CGA Comments: uses walls and other ibjects in clinic for steadying   FUNCTIONAL TESTS:  5 times sit to stand: 73.7 sec with significant UE assist and unable to stand in full erect posture  Timed up and go (TUG): 42.45 sec with SPC and with UE assist on STS transfer 10 meter walk test: 57.80 with bariatric walker Berg Balance Scale: 33/56   PATIENT SURVEYS:  FOTO 40  TODAY'S TREATMENT: DATE: 11/01/22   Nustep BLE/BUE endurance training x 4 min level 3. Instruction to keep SPM >45 and to improve ROM a to terminal knee extension for BLE throughout training.  Gait training with RW through rehab gym to bathroom x 31f then 239fto treatment table with no seated rest break.  Pt attempted to urinate, standing at toilet, but unsuccessfull. Distant supervision assist from PT for safety with cues for UE support on rail or RW. Additional gait training with 3# ankle weight with SPC x [id number] then with RW [id number] 3# ankle weights. Noted to have decreased step length, erect posture, and stance time on the LLE with SPC vs RW. Therapeutic rest break required between bouts due to BLE fatigue.  Increased time also required with SPC.   TUG.  PT instructed pt in TUG: 48.5 sec (average of 2 trials; 52 sec and 45 sec with RW; >13.5 sec indicates increased fall risk) Tug with SPC 45 sec and 45 sec. Noted to have increased difficulty with transfer on second bout with SPC, requiring multiple attempts to achieve standing.  Instruction also for improved AD management with each device.     Pt performed gait training with SPC with improved step length and posture in TUG, resulting in reduced time, but with increased distance, fatigue limited safety with use of SPC.   Throughout session, pt performed all transfers with supervision assist, requiring UE to push from arm rest on chairs, with cues and adequate weight shift.      PATIENT EDUCATION: Education details: POC  Person educated: Patient and Spouse Education method: Explanation Education comprehension: verbalized understanding  HOME EXERCISE PROGRAM: Access Code: [email or web address] URL: [email or web address] Date: 10/25/2022 Prepared by: AustBarrie Folkercises - Seated Hip Abduction with Pelvic Floor Contraction and Resistance Loop  - 1 x  daily - 7 x weekly - 3 sets - 10 reps - Sitting Knee Extension with Resistance  - 1 x daily - 7 x weekly - 3 sets - 10 reps - Standing Hip Flexor Stretch  - 1 x daily - 7 x weekly - 3 sets - 10 reps  GOALS: Goals reviewed with patient? Yes  SHORT TERM GOALS: Target date: 11/02/2022  Patient will be independent in home exercise program to improve strength/mobility for better  functional independence with ADLs. Baseline: No HEP currently  Goal status: INITIAL   LONG TERM GOALS: Target date: 12/28/2022  1.  Patient (> 40 years old) will complete five times sit to stand test in < 30 seconds indicating an increased LE strength and improved balance. Baseline: 73.7 sec with significant UE assistance and not able to obtain full erect posture in standing  Goal status: INITIAL  2.  Patient will increase FOTO score to equal to or greater than  47   to demonstrate statistically significant improvement in mobility and quality of life.  Baseline: 40 Goal status: INITIAL   3.  Patient will increase Berg Balance score by > 6 points to demonstrate decreased fall risk during functional activities. Baseline: 33/56 Goal status: INITIAL   4.  Patient will reduce timed up and go to <25 seconds with LRAD to reduce fall risk and demonstrate improved transfer/gait ability. Baseline: 42.45 with SPC and UE assist with stand  Goal status: INITIAL  5.  Patient will increase 10 meter walk test to >1.78ms as to improve gait speed for better community ambulation and to reduce fall risk. Baseline: 57.80 sec with bariatric walker Goal status: INITIAL    ASSESSMENT:  CLINICAL IMPRESSION:  Pt somewhat flat, but agreeable to PT treatment on this day.   PT treatment focused on gait and transfer training with emphasis in improved posture, step length, gait speed and AD management. Pt able to demonstrate improved RW management with turns at end of session, but denies understanding for improved AD use technique. At times, pt demonstrated impulsivity with movements and transfers, then mildly resistant to education and need for improved safety awareness. With increased distance, pt continues to demonstrate improved gait pattern and safety with RW over SPC. Pt will continue to benefit from skilled therapy to address the deficits and benefit from consistent safety awareness training.    OBJECTIVE  IMPAIRMENTS: Abnormal gait, decreased activity tolerance, decreased balance, decreased endurance, decreased knowledge of use of DME, decreased mobility, difficulty walking, decreased strength, decreased safety awareness, impaired perceived functional ability, and improper body mechanics.   ACTIVITY LIMITATIONS: bending, sitting, standing, squatting, stairs, transfers, bed mobility, bathing, toileting, and locomotion level  PARTICIPATION LIMITATIONS:  Pt reports no participation restrictions when prompted   PERSONAL FACTORS: 3+ comorbidities: HTN, depressed mood ( chart), T2DM  are also affecting patient's functional outcome.   REHAB POTENTIAL: Fair secondary to current functional level and progressive aspect of diagnosis   CLINICAL DECISION MAKING: Evolving/moderate complexity  EVALUATION COMPLEXITY: Moderate  PLAN:  PT FREQUENCY: 2x/week  PT DURATION: 12 weeks  PLANNED INTERVENTIONS: Therapeutic exercises, Therapeutic activity, Neuromuscular re-education, Balance training, Gait training, Patient/Family education, Self Care, Joint mobilization, Stair training, and Manual therapy   PLAN FOR NEXT SESSION:   Continue BLE strengthening and balance training. Endurance training.    ABarrie FolkPT, DPT  Physical Therapist - [facility] 1:16 PM 11/01/22

## 2022-11-03 ENCOUNTER — Ambulatory Visit: Payer: 59 | Admitting: Physical Therapy

## 2022-11-03 DIAGNOSIS — R2681 Unsteadiness on feet: Secondary | ICD-10-CM

## 2022-11-03 DIAGNOSIS — R269 Unspecified abnormalities of gait and mobility: Secondary | ICD-10-CM

## 2022-11-03 DIAGNOSIS — R262 Difficulty in walking, not elsewhere classified: Secondary | ICD-10-CM

## 2022-11-03 DIAGNOSIS — M6281 Muscle weakness (generalized): Secondary | ICD-10-CM

## 2022-11-03 NOTE — Therapy (Signed)
OUTPATIENT PHYSICAL THERAPY NEURO TREATMENT   Patient Name: Kirk Bennett. MRN: [id number] DOB:April 27, 1951, 72 y.o., male Today's Date: 11/03/2022   PCP: Ria Bush, MD  REFERRING PROVIDER: Jeanette Caprice,  MD    END OF SESSION:  PT End of Session - 11/03/22 1351     Visit Number 9    Number of Visits 24    Date for PT Re-Evaluation 12/28/22    PT Start Time 1350    PT Stop Time [id number]    PT Time Calculation (min) 41 min    Equipment Utilized During Treatment Gait belt    Activity Tolerance Patient tolerated treatment well;Patient limited by fatigue    Behavior During Therapy [facility] for tasks assessed/performed               Past Medical History:  Diagnosis Date   Cataracts, bilateral 02/2011   and suspected glaucoma, to return for f/u, no diabetic retinopathy   CKD stage 3 due to type 2 diabetes mellitus ([place]) 2015   normal renal US, self referred to Dr Holley Raring   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 Gabriel Carina endo --> 02/2016 decided to return to PCP for DM care   Past Surgical History:  Procedure Laterality Date   CARDIAC CATHETERIZATION  02/19/2009   No blockages (Dr. 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   Irregular heart beat 07/19/2021   BPH (benign prostatic hyperplasia) 10/03/2020   Pain due to onychomycosis of toenails of both feet 06/18/2020   Stage 3 chronic kidney disease ([place] 10/23/2019   Type 2 diabetes mellitus with diabetic neuropathy, unspecified ([place] 10/21/2019   Impaired gait and mobility 10/14/2019   Postural dizziness with presyncope 10/13/2019   Ventricular premature complexes 10/13/2019   Urge urinary incontinence 09/26/2019   Depressed mood 09/26/2019   Advanced care planning/counseling discussion 09/22/2018   Weakness of both lower extremities 09/22/2018   History of hepatitis 01/05/2018   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   NAFLD (nonalcoholic fatty liver disease) 09/30/2017   Obesity, Class I, BMI 30.0-34.9 (see actual BMI) 09/18/2017   Polyarthralgia 02/15/2017   OSA (obstructive sleep apnea) 02/15/2017   Partial epilepsy with impairment of consciousness ([place] 01/29/2016   Low vitamin B12 level  05/27/2015   Elevated PSA 05/27/2015   Vitamin D deficiency 03/02/2015   Other long term (current) drug therapy 03/12/2012   Encounter for general adult medical examination with abnormal findings 04/26/2011   TOTAL KNEE REPLACEMENT, LEFT, HX OF 04/07/2010   Type 2 diabetes mellitus with diabetic nephropathy ([place]) 03/30/2010   Hyperlipidemia associated with type 2 diabetes mellitus ([place]) 03/30/2010   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 ([place]) 03/30/2010   Essential hypertension, benign 03/30/2010    ONSET DATE: 05/2021  REFERRING DIAG: G20.C (ICD-10-CM) -  Parkinsonism, unspecified   THERAPY DIAG:  Abnormality of gait and mobility  Difficulty in walking, not elsewhere classified  Muscle weakness (generalized)  Unsteadiness on feet  Rationale for Evaluation and Treatment: Rehabilitation  SUBJECTIVE:                                                                                                                                                                                             SUBJECTIVE STATEMENT:   Pt reports that he is doing well. Has done "nothing" since prior session. Wife states that he has been using cane around house, but reports that he is not as stable with cane compared to RW.     Pt accompanied by: significant other  PERTINENT HISTORY: Diabetic neuropathy, atypical PD which MD thinks could be PSP, pt reports diagnosis is parkinson's disease.    PAIN:  Are you having pain? No  PRECAUTIONS: Fall  WEIGHT BEARING RESTRICTIONS: No  FALLS: Has patient fallen in last 6 months? Yes. Number of falls 1 fall when he slippe dand fell getting out of the shower.   LIVING ENVIRONMENT: Lives with: lives with their family and lives with their spouse Lives in: House/apartment Stairs: Yes: Internal: 64, 2 sets steps; on right going up and External: 5 steps; on right going up Has following equipment at home: Single point cane, Walker - 2 wheeled, shower chair, and elevated commode  PLOF: Independent with basic ADLs, Independent with household mobility with device, Independent with community mobility with device, and Needs assistance with homemaking  PATIENT GOALS: Lower and upper body strength as well as balance   OBJECTIVE:   DIAGNOSTIC FINDINGS: No significant findings   COGNITION: Overall cognitive status:  A little flat but otherwise WNL    SENSATION: Not tested  COORDINATION: Not tested   EDEMA:  None   MUSCLE TONE: Not tested, if in supine may be good to assess if pt has rigidity    POSTURE: rounded  shoulders, forward head, and increased thoracic kyphosis  LOWER EXTREMITY ROM:   WNL in seated, in standing pt unable  to obtain full erect posture.    LOWER EXTREMITY MMT:    MMT Right Eval Left Eval  Hip flexion 4 4-  Hip extension    Hip abduction 4+ 4+  Hip adduction 4 4  Hip internal rotation    Hip external rotation    Knee flexion 4+ 4+  Knee extension 4+ 4+  Ankle dorsiflexion 4+ 4+  Ankle plantarflexion 5 5  Ankle inversion    Ankle eversion    (Blank rows = not tested) All tested in seated position   BED MOBILITY:  Not tested at eval   TRANSFERS: Assistive device utilized:  UE and cane    Sit to stand: CGA Stand to sit: CGA Chair to chair: CGA Floor:  Not indicated for testing     CURB: Not tested   STAIRS: Not tested at eval, to look at in future assessments    GAIT: Gait pattern: step through pattern, decreased stride length, knee flexed in stance- Right, knee flexed in stance- Left, shuffling, and trunk flexed Distance walked: 30 feet  Assistive device utilized: Single point cane Level of assistance: CGA Comments: uses walls and other ibjects in clinic for steadying   FUNCTIONAL TESTS:  5 times sit to stand: 73.7 sec with significant UE assist and unable to stand in full erect posture  Timed up and go (TUG): 42.45 sec with SPC and with UE assist on STS transfer 10 meter walk test: 57.80 with bariatric walker Berg Balance Scale: 33/56   PATIENT SURVEYS:  FOTO 40  TODAY'S TREATMENT: DATE: 11/03/22  Wife present throughout session  Gait training with RW through rehab gym to x47ft +76ft x2 and with SPC x 22ft. Pt required significantly more time and effort with [facility] as well as antalgic presentation, but no reports of pain. Improved posture and step length with RW compared to [facility]. Pt reports preferance for [facility] with lack of awareness for safety concerns with RW, educated wife on need for RW for safety in home.   Therex:  Standing Hip abduction  blue Tband 2 x 10 Hip extension blue Tband 2 x 10 Hip flexion blue Tband 2x 10  Toe raise/heel raise 2x 12 Seated hip abduction blue Tband 2 x 12 Squat with band on thighs, blue Tband 2 x 10  Standing hip flexion with band on thighs. 2 x 10  Instruction for full ROM and improved posture to improve activation of target muscles throughout therex with seated rest break between sets.   PATIENT EDUCATION: Education details: POC  Person educated: Patient and Spouse Education method: Explanation Education comprehension: verbalized understanding  HOME EXERCISE PROGRAM: Access Code: [email or web address] URL: [email or web address] Date: 10/25/2022 Prepared by: Grier Rocher  Exercises - Seated Hip Abduction with Pelvic Floor Contraction and Resistance Loop  - 1 x daily - 7 x weekly - 3 sets - 10 reps - Sitting Knee Extension with Resistance  - 1 x daily - 7 x weekly - 3 sets - 10 reps - Standing Hip Flexor Stretch  - 1 x daily - 7 x weekly - 3 sets - 10 reps  GOALS: Goals reviewed with patient? Yes  SHORT TERM GOALS: Target date: 11/02/2022  Patient will be independent in home exercise program to improve strength/mobility for better functional independence with ADLs. Baseline: provided on 3/5 Goal status: INITIAL   LONG TERM GOALS: Target date: 12/28/2022  1.  Patient (> 62 years old) will complete five times sit to stand test in < 30 seconds indicating an increased  LE strength and improved balance. Baseline: 73.7 sec with significant UE assistance and not able to obtain full erect posture in standing  Goal status: INITIAL  2.  Patient will increase FOTO score to equal to or greater than  47   to demonstrate statistically significant improvement in mobility and quality of life.  Baseline: 40 Goal status: INITIAL   3.  Patient will increase Berg Balance score by > 6 points to demonstrate decreased fall risk during functional activities. Baseline: 33/56 Goal status: INITIAL   4.   Patient will reduce timed up and go to <25 seconds with LRAD to reduce fall risk and demonstrate improved transfer/gait ability. Baseline: 42.45 with SPC and UE assist with stand  Goal status: INITIAL  5.  Patient will increase 10 meter walk test to >1.73m/s as to improve gait speed for better community ambulation and to reduce fall risk. Baseline: 57.80 sec with bariatric walker Goal status: INITIAL    ASSESSMENT:  CLINICAL IMPRESSION:  Pt somewhat flat, but agreeable to PT treatment on this day.   PT treatment focused on safety education for AD use at home and BLE strengthening.  Pt will continue to benefit from skilled therapy to address the deficits and benefit from consistent safety awareness training.    OBJECTIVE IMPAIRMENTS: Abnormal gait, decreased activity tolerance, decreased balance, decreased endurance, decreased knowledge of use of DME, decreased mobility, difficulty walking, decreased strength, decreased safety awareness, impaired perceived functional ability, and improper body mechanics.   ACTIVITY LIMITATIONS: bending, sitting, standing, squatting, stairs, transfers, bed mobility, bathing, toileting, and locomotion level  PARTICIPATION LIMITATIONS:  Pt reports no participation restrictions when prompted   PERSONAL FACTORS: 3+ comorbidities: HTN, depressed mood ( chart), T2DM  are also affecting patient's functional outcome.   REHAB POTENTIAL: Fair secondary to current functional level and progressive aspect of diagnosis   CLINICAL DECISION MAKING: Evolving/moderate complexity  EVALUATION COMPLEXITY: Moderate  PLAN:  PT FREQUENCY: 2x/week  PT DURATION: 12 weeks  PLANNED INTERVENTIONS: Therapeutic exercises, Therapeutic activity, Neuromuscular re-education, Balance training, Gait training, Patient/Family education, Self Care, Joint mobilization, Stair training, and Manual therapy   PLAN FOR NEXT SESSION:   PROGRESS NOTE and Complete Foto re-assessment. Continue  balance training. Endurance training. BLE strengthening.    Barrie Folk PT, DPT  Physical Therapist - [facility]  5:34 PM 11/03/22

## 2022-11-06 ENCOUNTER — Other Ambulatory Visit: Payer: Self-pay | Admitting: Family Medicine

## 2022-11-06 DIAGNOSIS — E1169 Type 2 diabetes mellitus with other specified complication: Secondary | ICD-10-CM

## 2022-11-06 DIAGNOSIS — E559 Vitamin D deficiency, unspecified: Secondary | ICD-10-CM

## 2022-11-06 DIAGNOSIS — R7989 Other specified abnormal findings of blood chemistry: Secondary | ICD-10-CM

## 2022-11-06 DIAGNOSIS — N1831 Chronic kidney disease, stage 3a: Secondary | ICD-10-CM

## 2022-11-06 DIAGNOSIS — R972 Elevated prostate specific antigen [PSA]: Secondary | ICD-10-CM

## 2022-11-06 DIAGNOSIS — E1140 Type 2 diabetes mellitus with diabetic neuropathy, unspecified: Secondary | ICD-10-CM

## 2022-11-06 NOTE — Addendum Note (Signed)
Addended by: Ria Bush on: 11/06/2022 10:23 PM   Modules accepted: Orders

## 2022-11-08 ENCOUNTER — Ambulatory Visit: Payer: 59 | Admitting: Physical Therapy

## 2022-11-08 DIAGNOSIS — R269 Unspecified abnormalities of gait and mobility: Secondary | ICD-10-CM

## 2022-11-08 DIAGNOSIS — R262 Difficulty in walking, not elsewhere classified: Secondary | ICD-10-CM

## 2022-11-08 DIAGNOSIS — R293 Abnormal posture: Secondary | ICD-10-CM

## 2022-11-08 DIAGNOSIS — R2689 Other abnormalities of gait and mobility: Secondary | ICD-10-CM

## 2022-11-08 DIAGNOSIS — R2681 Unsteadiness on feet: Secondary | ICD-10-CM

## 2022-11-08 DIAGNOSIS — M6281 Muscle weakness (generalized): Secondary | ICD-10-CM

## 2022-11-08 NOTE — Therapy (Signed)
OUTPATIENT PHYSICAL THERAPY NEURO TREATMENT/PROGRESS NOTE reporting period 10/05/22-11/08/22   Patient Name: Kirk Bennett. MRN: [id number] DOB:1951/08/07, 72 y.o., male Today's Date: 11/08/2022   PCP: Ria Bush, MD  REFERRING PROVIDER: Jeanette Caprice,  MD    END OF SESSION:  PT End of Session - 11/08/22 1524     Visit Number 10    Number of Visits 24    Date for PT Re-Evaluation 12/28/22    Progress Note Due on Visit 84    PT Start Time 1524    PT Stop Time 1600    PT Time Calculation (min) 36 min    Equipment Utilized During Treatment Gait belt    Activity Tolerance Patient tolerated treatment well;Patient limited by fatigue    Behavior During Therapy [facility] for tasks assessed/performed               Past Medical History:  Diagnosis Date   Cataracts, bilateral 02/2011   and suspected glaucoma, to return for f/u, no diabetic retinopathy   CKD stage 3 due to type 2 diabetes mellitus ([place]) 2015   normal renal US, self referred to Dr Holley Raring   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 Gabriel Carina endo --> 02/2016 decided to return to PCP for DM care   Past Surgical History:  Procedure Laterality Date   CARDIAC CATHETERIZATION  02/19/2009   No blockages (Dr. Liliane Shi)   COLONOSCOPY  09/16/2005   hyperplastic polyps, rpt due 10 yrs    COLONOSCOPY WITH PROPOFOL N/A 12/11/2015   mult polyps, few TA, rpt 70yrs ([place])   COLONOSCOPY WITH PROPOFOL N/A  09/02/2021   TAs, HPs, int hem, distal ileal polyposis - benign, rpt yrs([place], Richardo Hanks, DO)   corrective surgery amblyopia  08/23/1955   Cystectomy or meningioma removal brain  08/22/1988   (unclear)   Left knee surgery  08/22/1969   Torn ACL   REPLACEMENT TOTAL KNEE  11/20/2009   Left ([facility] Dr. 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   Irregular heart beat 07/19/2021   BPH (benign prostatic hyperplasia) 10/03/2020   Pain due to onychomycosis of toenails of both feet 06/18/2020   Stage 3 chronic kidney disease ([place]) 10/23/2019   Type 2 diabetes mellitus with diabetic neuropathy, unspecified ([place]) 10/21/2019   Impaired gait and mobility 10/14/2019   Postural dizziness with presyncope 10/13/2019   Ventricular premature complexes 10/13/2019   Urge urinary incontinence 09/26/2019   Depressed mood 09/26/2019   Advanced care planning/counseling discussion 09/22/2018   Weakness of both lower extremities 09/22/2018   History of hepatitis 01/05/2018   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   NAFLD (nonalcoholic fatty liver disease) 09/30/2017   Obesity, Class I, BMI 30.0-34.9 (see actual BMI) 09/18/2017   Polyarthralgia 02/15/2017   OSA (obstructive sleep apnea) 02/15/2017   Partial  epilepsy with impairment of consciousness ([place]) 01/29/2016   Low vitamin B12 level 05/27/2015   Elevated PSA 05/27/2015   Vitamin D deficiency 03/02/2015   Other long term (current) drug therapy 03/12/2012   Encounter for general adult medical examination with abnormal findings 04/26/2011   TOTAL KNEE REPLACEMENT, LEFT, HX OF 04/07/2010   Type 2 diabetes mellitus with diabetic nephropathy ([place]) 03/30/2010   Hyperlipidemia associated with type 2 diabetes mellitus ([place]) 03/30/2010   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 ([place]) 03/30/2010   Essential hypertension, benign  03/30/2010    ONSET DATE: 05/2021  REFERRING DIAG: G20.C (ICD-10-CM) - Parkinsonism, unspecified   THERAPY DIAG:  Abnormality of gait and mobility  Difficulty in walking, not elsewhere classified  Muscle weakness (generalized)  Unsteadiness on feet  Other abnormalities of gait and mobility  Abnormal posture  Rationale for Evaluation and Treatment: Rehabilitation  SUBJECTIVE:                                                                                                                                                                                             SUBJECTIVE STATEMENT:   Pt reports that he is doing well. Has done "nothing" since prior session. Pt is late to therapy session on this day. Unable to recall why they were running behind.     Pt accompanied by: self  PERTINENT HISTORY: Diabetic neuropathy, atypical PD which MD thinks could be PSP, pt reports diagnosis is parkinson's disease.    PAIN:  Are you having pain? No  PRECAUTIONS: Fall  WEIGHT BEARING RESTRICTIONS: No  FALLS: Has patient fallen in last 6 months? Yes. Number of falls 1 fall when he slippe dand fell getting out of the shower.   LIVING ENVIRONMENT: Lives with: lives with their family and lives with their spouse Lives in: House/apartment Stairs: Yes: Internal: 69, 2 sets steps; on right going up and External: 5 steps; on right going up Has following equipment at home: Single point cane, Walker - 2 wheeled, shower chair, and elevated commode  PLOF: Independent with basic ADLs, Independent with household mobility with device, Independent with community mobility with device, and Needs assistance with homemaking  PATIENT GOALS: Lower and upper body strength as well as balance   OBJECTIVE:   DIAGNOSTIC FINDINGS: No significant findings   COGNITION: Overall cognitive status:  A little flat but otherwise WNL    SENSATION: Not tested  COORDINATION: Not tested   EDEMA:  None    MUSCLE TONE: Not tested, if in supine may be good to assess if pt has rigidity    POSTURE: rounded shoulders, forward head, and  increased thoracic kyphosis  LOWER EXTREMITY ROM:   WNL in seated, in standing pt unable to obtain full erect posture.    LOWER EXTREMITY MMT:    MMT Right Eval Left Eval  Hip flexion 4 4-  Hip extension    Hip abduction 4+ 4+  Hip adduction 4 4  Hip internal rotation    Hip external rotation    Knee flexion 4+ 4+  Knee extension 4+ 4+  Ankle dorsiflexion 4+ 4+  Ankle plantarflexion 5 5  Ankle inversion    Ankle eversion    (Blank rows = not tested) All tested in seated position   BED MOBILITY:  Not tested at eval   TRANSFERS: Assistive device utilized:  UE and cane    Sit to stand: CGA Stand to sit: CGA Chair to chair: CGA Floor:  Not indicated for testing     CURB: Not tested   STAIRS: Not tested at eval, to look at in future assessments    GAIT: Gait pattern: step through pattern, decreased stride length, knee flexed in stance- Right, knee flexed in stance- Left, shuffling, and trunk flexed Distance walked: 30 feet  Assistive device utilized: Single point cane Level of assistance: CGA Comments: uses walls and other ibjects in clinic for steadying   FUNCTIONAL TESTS:  5 times sit to stand:  2/14:73.7 sec with significant UE assist and unable to stand in full erect posture. 3/19: 36.76 with UEpushing from arm rests  Timed up and go (TUG): 2/14: 42.45 sec with SPC and with UE assist on STS transfer. 3/19 55.2 sec with RW and UE assist for chair transfer. 10 meter walk test: 2/14: 57.80 with bariatric walker. 3/19: 44.31sec  Berg Balance Scale: 33/56   PATIENT SURVEYS:  FOTO 2/14: 40. 3/19: 50   TODAY'S TREATMENT: DATE: 11/08/22  Limited due to pt being late to session.   Pt performed 5 time sit<>stand (5xSTS): 36.76 sec (>15 sec indicates increased fall risk) performed with BUE to push from arm rest.  PT instructed pt in  TUG: 55.2 sec ( >13.5 sec indicates increased fall risk) 10 Meter Walk Test: Patient instructed to walk 10 meters (32.8 ft) as quickly and as safely as possible at their normal speed. Time measured from 2 meter mark to 8 meter mark to accommodate ramp-up and ramp-down.  Average Normal speed: 0.22 m/s (44.31 sec) Cut off scores: <0.4 m/s = household Ambulator, 0.4-0.8 m/s = limited community Ambulator, >0.8 m/s = community Ambulator, >1.2 m/s = crossing a street, <1.0 = increased fall risk MCID 0.05 m/s (small), 0.13 m/s (moderate), 0.06 m/s (significant)  (ANPTA Core Set of Outcome Measures for Adults with Neurologic Conditions, 2018)   Gait training through rehab gym with HDRW 2x 73ft with supervision assist for safety. Noted to have increased hip flexion in stance with mild report of pain in the L hip on this day, but cannot specifify location or description when asked by PT.   PATIENT EDUCATION: Education details: POC  Person educated: Patient and Spouse Education method: Explanation Education comprehension: verbalized understanding  HOME EXERCISE PROGRAM: Access Code: [email or web address] URL: [email or web address] Date: 10/25/2022 Prepared by: Barrie Folk  Exercises - Seated Hip Abduction with Pelvic Floor Contraction and Resistance Loop  - 1 x daily - 7 x weekly - 3 sets - 10 reps - Sitting Knee Extension with Resistance  - 1 x daily - 7 x weekly - 3 sets - 10 reps - Standing Hip Flexor Stretch  - 1 x daily -  7 x weekly - 3 sets - 10 reps  GOALS: Goals reviewed with patient? Yes  SHORT TERM GOALS: Target date: 11/02/2022  Patient will be independent in home exercise program to improve strength/mobility for better functional independence with ADLs. Baseline: provided on 3/5 Goal status: progressing    LONG TERM GOALS: Target date: 12/28/2022  1.  Patient (> 8 years old) will complete five times sit to stand test in < 30 seconds indicating an increased LE strength and  improved balance. Baseline: 2/14: 73.7 sec with significant UE assistance and not able to obtain full erect posture in standing. 3/19: 36.76 with UE to push from arm rest Goal status: progressing   2.  Patient will increase FOTO score to equal to or greater than  47   to demonstrate statistically significant improvement in mobility and quality of life.  Baseline:2/14: 40 3/19: 50   Goal status: MET    3.  Patient will increase Berg Balance score by > 6 points to demonstrate decreased fall risk during functional activities. Baseline: 33/56  Goal status: progressing    4.  Patient will reduce timed up and go to <25 seconds with LRAD to reduce fall risk and demonstrate improved transfer/gait ability. Baseline: 2/14: 42.45 with SPC and UE assist with stand. 3/19: 55.2 sec with HDRW Goal status: progressing   5.  Patient will increase 10 meter walk test to >1.33m/s as to improve gait speed for better community ambulation and to reduce fall risk. Baseline: 57.80 sec with bariatric walker 3/19: 0.22 m/s (44.31 sec) Goal status: INITIAL    ASSESSMENT:  CLINICAL IMPRESSION:  Pt somewhat flat, but agreeable to PT treatment on this day.   PT assessed progress toward short and long term goals on this day with noted improvement in 50mWalk test, 5xSTS, and FOTO. Progress limited on this day in Berg balance scale and TUG due to L hip pain. Pt reports feeling stronger and safer with gait and transfers since starting PT treatment; wife in agreement of progress, reporting that pt has been more mobile at home with and without RW.  Pt will continue to benefit from skilled therapy to address the deficits and benefit from consistent safety awareness training.    OBJECTIVE IMPAIRMENTS: Abnormal gait, decreased activity tolerance, decreased balance, decreased endurance, decreased knowledge of use of DME, decreased mobility, difficulty walking, decreased strength, decreased safety awareness, impaired perceived  functional ability, and improper body mechanics.   ACTIVITY LIMITATIONS: bending, sitting, standing, squatting, stairs, transfers, bed mobility, bathing, toileting, and locomotion level  PARTICIPATION LIMITATIONS:  Pt reports no participation restrictions when prompted   PERSONAL FACTORS: 3+ comorbidities: HTN, depressed mood ( chart), T2DM  are also affecting patient's functional outcome.   REHAB POTENTIAL: Fair secondary to current functional level and progressive aspect of diagnosis   CLINICAL DECISION MAKING: Evolving/moderate complexity  EVALUATION COMPLEXITY: Moderate  PLAN:  PT FREQUENCY: 2x/week  PT DURATION: 12 weeks  PLANNED INTERVENTIONS: Therapeutic exercises, Therapeutic activity, Neuromuscular re-education, Balance training, Gait training, Patient/Family education, Self Care, Joint mobilization, Stair training, and Manual therapy   PLAN FOR NEXT SESSION:   Perform Berg balance scale. Continue balance training. Endurance training. BLE strengthening.    Barrie Folk PT, DPT  Physical Therapist - [facility]  5:31 PM 11/08/22

## 2022-11-09 ENCOUNTER — Other Ambulatory Visit (INDEPENDENT_AMBULATORY_CARE_PROVIDER_SITE_OTHER): Payer: 59

## 2022-11-09 DIAGNOSIS — N1831 Chronic kidney disease, stage 3a: Secondary | ICD-10-CM | POA: Diagnosis not present

## 2022-11-09 DIAGNOSIS — R972 Elevated prostate specific antigen [PSA]: Secondary | ICD-10-CM

## 2022-11-09 DIAGNOSIS — E785 Hyperlipidemia, unspecified: Secondary | ICD-10-CM

## 2022-11-09 DIAGNOSIS — E1140 Type 2 diabetes mellitus with diabetic neuropathy, unspecified: Secondary | ICD-10-CM | POA: Diagnosis not present

## 2022-11-09 DIAGNOSIS — R7989 Other specified abnormal findings of blood chemistry: Secondary | ICD-10-CM

## 2022-11-09 DIAGNOSIS — E559 Vitamin D deficiency, unspecified: Secondary | ICD-10-CM

## 2022-11-09 DIAGNOSIS — E1169 Type 2 diabetes mellitus with other specified complication: Secondary | ICD-10-CM

## 2022-11-09 LAB — PSA: PSA: 1.35 ng/mL (ref 0.10–4.00)

## 2022-11-09 LAB — COMPREHENSIVE METABOLIC PANEL
ALT: 2 U/L (ref 0–53)
AST: 10 U/L (ref 0–37)
Albumin: 4.1 g/dL (ref 3.5–5.2)
Alkaline Phosphatase: 115 U/L (ref 39–117)
BUN: 21 mg/dL (ref 6–23)
CO2: 23 mEq/L (ref 19–32)
Calcium: 9.3 mg/dL (ref 8.4–10.5)
Chloride: 104 mEq/L (ref 96–112)
Creatinine, Ser: 1.64 mg/dL — ABNORMAL HIGH (ref 0.40–1.50)
GFR: 42.34 mL/min — ABNORMAL LOW (ref >60.00)
Glucose, Bld: 192 mg/dL — ABNORMAL HIGH (ref 70–99)
Potassium: 4.1 mEq/L (ref 3.5–5.1)
Sodium: 137 mEq/L (ref 135–145)
Total Bilirubin: 1 mg/dL (ref 0.2–1.2)
Total Protein: 6.7 g/dL (ref 6.0–8.3)

## 2022-11-09 LAB — CBC WITH DIFFERENTIAL/PLATELET
Basophils Absolute: 0 10*3/uL (ref 0.0–0.1)
Basophils Relative: 0.5 % (ref 0.0–3.0)
Eosinophils Absolute: 0.3 10*3/uL (ref 0.0–0.7)
Eosinophils Relative: 4.1 % (ref 0.0–5.0)
HCT: 44.4 % (ref 39.0–52.0)
Hemoglobin: 14.7 g/dL (ref 13.0–17.0)
Lymphocytes Relative: 32.4 % (ref 12.0–46.0)
Lymphs Abs: 2.2 10*3/uL (ref 0.7–4.0)
MCHC: 33 g/dL (ref 30.0–36.0)
MCV: 87.8 fl (ref 78.0–100.0)
Monocytes Absolute: 0.4 10*3/uL (ref 0.1–1.0)
Monocytes Relative: 6.4 % (ref 3.0–12.0)
Neutro Abs: 3.8 10*3/uL (ref 1.4–7.7)
Neutrophils Relative %: 56.6 % (ref 43.0–77.0)
Platelets: 209 10*3/uL (ref 150.0–400.0)
RBC: 5.06 Mil/uL (ref 4.22–5.81)
RDW: 12.8 % (ref 11.5–15.5)
WBC: 6.8 10*3/uL (ref 4.0–10.5)

## 2022-11-09 LAB — LIPID PANEL
Cholesterol: 127 mg/dL (ref 0–200)
HDL: 38.1 mg/dL — ABNORMAL LOW (ref >39.00)
LDL Cholesterol: 67 mg/dL (ref 0–99)
NonHDL: 88.73
Total CHOL/HDL Ratio: 3
Triglycerides: 109 mg/dL (ref 0.0–149.0)
VLDL: 21.8 mg/dL (ref 0.0–40.0)

## 2022-11-09 LAB — VITAMIN B12: Vitamin B-12: 1217 pg/mL — ABNORMAL HIGH (ref 211–911)

## 2022-11-09 LAB — VITAMIN D 25 HYDROXY (VIT D DEFICIENCY, FRACTURES): VITD: 29.97 ng/mL — ABNORMAL LOW (ref 30.00–100.00)

## 2022-11-09 LAB — PHOSPHORUS: Phosphorus: 3.7 mg/dL (ref 2.3–4.6)

## 2022-11-09 LAB — HEMOGLOBIN A1C: Hgb A1c MFr Bld: 7.6 % — ABNORMAL HIGH (ref 4.6–6.5)

## 2022-11-09 NOTE — Addendum Note (Signed)
Addended by: Ellamae Sia on: 11/09/2022 11:58 AM   Modules accepted: Orders

## 2022-11-10 ENCOUNTER — Ambulatory Visit: Payer: 59 | Admitting: Physical Therapy

## 2022-11-10 DIAGNOSIS — R2681 Unsteadiness on feet: Secondary | ICD-10-CM

## 2022-11-10 DIAGNOSIS — R296 Repeated falls: Secondary | ICD-10-CM

## 2022-11-10 DIAGNOSIS — M6281 Muscle weakness (generalized): Secondary | ICD-10-CM

## 2022-11-10 DIAGNOSIS — R262 Difficulty in walking, not elsewhere classified: Secondary | ICD-10-CM

## 2022-11-10 DIAGNOSIS — R293 Abnormal posture: Secondary | ICD-10-CM

## 2022-11-10 DIAGNOSIS — R269 Unspecified abnormalities of gait and mobility: Secondary | ICD-10-CM | POA: Diagnosis not present

## 2022-11-10 DIAGNOSIS — R2689 Other abnormalities of gait and mobility: Secondary | ICD-10-CM

## 2022-11-10 NOTE — Therapy (Signed)
OUTPATIENT PHYSICAL THERAPY NEURO TREATMENT/PROGRESS NOTE reporting period 10/05/22-11/08/22   Patient Name: Kirk Bennett. MRN: [id number] DOB:1950/11/21, 72 y.o., male Today's Date: 11/10/2022   PCP: Ria Bush, MD  REFERRING PROVIDER: Jeanette Caprice,  MD    END OF SESSION:  PT End of Session - 11/10/22 1419     Visit Number 11    Number of Visits 24    Date for PT Re-Evaluation 12/28/22    Progress Note Due on Visit 55    PT Start Time 1351    PT Stop Time 1432    PT Time Calculation (min) 41 min    Equipment Utilized During Treatment Gait belt    Activity Tolerance Patient tolerated treatment well;Patient limited by fatigue    Behavior During Therapy [facility] for tasks assessed/performed               Past Medical History:  Diagnosis Date   Cataracts, bilateral 02/2011   and suspected glaucoma, to return for f/u, no diabetic retinopathy   CKD stage 3 due to type 2 diabetes mellitus ([place]) 2015   normal renal US, self referred to Dr Holley Raring   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 Gabriel Carina endo --> 02/2016 decided to return to PCP for DM care   Past Surgical History:  Procedure Laterality Date   CARDIAC CATHETERIZATION  02/19/2009   No blockages (Dr. Liliane Shi)   COLONOSCOPY  09/16/2005   hyperplastic polyps, rpt due 10 yrs    COLONOSCOPY WITH PROPOFOL N/A 12/11/2015   mult polyps, few TA, rpt 15yrs ([place])   COLONOSCOPY WITH PROPOFOL N/A  09/02/2021   TAs, HPs, int hem, distal ileal polyposis - benign, rpt yrs([place], Richardo Hanks, DO)   corrective surgery amblyopia  08/23/1955   Cystectomy or meningioma removal brain  08/22/1988   (unclear)   Left knee surgery  08/22/1969   Torn ACL   REPLACEMENT TOTAL KNEE  11/20/2009   Left ([facility] Dr. 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   Irregular heart beat 07/19/2021   BPH (benign prostatic hyperplasia) 10/03/2020   Pain due to onychomycosis of toenails of both feet 06/18/2020   Stage 3 chronic kidney disease ([place]) 10/23/2019   Type 2 diabetes mellitus with diabetic neuropathy, unspecified ([place]) 10/21/2019   Impaired gait and mobility 10/14/2019   Postural dizziness with presyncope 10/13/2019   Ventricular premature complexes 10/13/2019   Urge urinary incontinence 09/26/2019   Depressed mood 09/26/2019   Advanced care planning/counseling discussion 09/22/2018   Weakness of both lower extremities 09/22/2018   History of hepatitis 01/05/2018   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   NAFLD (nonalcoholic fatty liver disease) 09/30/2017   Obesity, Class I, BMI 30.0-34.9 (see actual BMI) 09/18/2017   Polyarthralgia 02/15/2017   OSA (obstructive sleep apnea) 02/15/2017   Partial  epilepsy with impairment of consciousness ([place]) 01/29/2016   Low vitamin B12 level 05/27/2015   Elevated PSA 05/27/2015   Vitamin D deficiency 03/02/2015   Other long term (current) drug therapy 03/12/2012   Encounter for general adult medical examination with abnormal findings 04/26/2011   TOTAL KNEE REPLACEMENT, LEFT, HX OF 04/07/2010   Type 2 diabetes mellitus with diabetic nephropathy ([place]) 03/30/2010   Hyperlipidemia associated with type 2 diabetes mellitus ([place]) 03/30/2010   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 ([place]) 03/30/2010   Essential hypertension, benign  03/30/2010    ONSET DATE: 05/2021  REFERRING DIAG: G20.C (ICD-10-CM) - Parkinsonism, unspecified   THERAPY DIAG:  Abnormality of gait and mobility  Difficulty in walking, not elsewhere classified  Muscle weakness (generalized)  Unsteadiness on feet  Other abnormalities of gait and mobility  Abnormal posture  Repeated falls  Rationale for Evaluation and Treatment: Rehabilitation  SUBJECTIVE:                                                                                                                                                                                             SUBJECTIVE STATEMENT:   Pt reports that he is doing well. No updates from patient from prior PT treatment.     Pt accompanied by: self  PERTINENT HISTORY: Diabetic neuropathy, atypical PD which MD thinks could be PSP, pt reports diagnosis is parkinson's disease.    PAIN:  Are you having pain? No  PRECAUTIONS: Fall  WEIGHT BEARING RESTRICTIONS: No  FALLS: Has patient fallen in last 6 months? Yes. Number of falls 1 fall when he slippe dand fell getting out of the shower.   LIVING ENVIRONMENT: Lives with: lives with their family and lives with their spouse Lives in: House/apartment Stairs: Yes: Internal: 49, 2 sets steps; on right going up and External: 5 steps; on right going up Has following equipment at home: Single point cane, Walker - 2 wheeled, shower chair, and elevated commode  PLOF: Independent with basic ADLs, Independent with household mobility with device, Independent with community mobility with device, and Needs assistance with homemaking  PATIENT GOALS: Lower and upper body strength as well as balance   OBJECTIVE:   DIAGNOSTIC FINDINGS: No significant findings   COGNITION: Overall cognitive status:  A little flat but otherwise WNL    SENSATION: Not tested  COORDINATION: Not tested   EDEMA:  None   MUSCLE TONE: Not tested, if in supine may be good to assess if pt  has rigidity    POSTURE: rounded shoulders, forward head, and increased thoracic kyphosis  LOWER EXTREMITY ROM:   WNL in seated,  in standing pt unable to obtain full erect posture.    LOWER EXTREMITY MMT:    MMT Right Eval Left Eval  Hip flexion 4 4-  Hip extension    Hip abduction 4+ 4+  Hip adduction 4 4  Hip internal rotation    Hip external rotation    Knee flexion 4+ 4+  Knee extension 4+ 4+  Ankle dorsiflexion 4+ 4+  Ankle plantarflexion 5 5  Ankle inversion    Ankle eversion    (Blank rows = not tested) All tested in seated position   BED MOBILITY:  Not tested at eval   TRANSFERS: Assistive device utilized:  UE and cane    Sit to stand: CGA Stand to sit: CGA Chair to chair: CGA Floor:  Not indicated for testing     CURB: Not tested   STAIRS: Not tested at eval, to look at in future assessments    GAIT: Gait pattern: step through pattern, decreased stride length, knee flexed in stance- Right, knee flexed in stance- Left, shuffling, and trunk flexed Distance walked: 30 feet  Assistive device utilized: Single point cane Level of assistance: CGA Comments: uses walls and other ibjects in clinic for steadying   FUNCTIONAL TESTS:  5 times sit to stand:  2/14:73.7 sec with significant UE assist and unable to stand in full erect posture. 3/19: 36.76 with UEpushing from arm rests  Timed up and go (TUG): 2/14: 42.45 sec with SPC and with UE assist on STS transfer. 3/19 55.2 sec with RW and UE assist for chair transfer. 10 meter walk test: 2/14: 57.80 with bariatric walker. 3/19: 44.31sec  Berg Balance Scale: 33/56   PATIENT SURVEYS:  FOTO 2/14: 40. 3/19: 50   TODAY'S TREATMENT: DATE: 11/10/22  Pt performed sit<>stand transfers and stand pivot transfers with CGA from elevated seat height on this day from transport chair, nustep and arm chair.   Nustep reciprocal endurance training x 5 min level 4 with instruction to keep SPM >50 throughout. Mulitple cues  for speed and ROM.   Gait training with HDRW through rehab gym x 75ft, 72ft, 47ft and 19ft to bathroom. Instruction from PT continues to be required in turns to improve safety to keep RW closer body.  Pt is resistive to education on this day. Attempted urination at toilet without success. Required seated rest break following standing on toilet prior to pulling pants to waist. Mod assist to stand from standard seat with no arm rests. Mod-max cues for anterior weight shift, but limited due to Bil knee thightness, R>L.   Reciprocal stepping over cane in floor x 8 Bil with UE support on HDRW with cues for posture and increased step height over cane. Folowing reciprocal stepping, pt requesting to use restroom for urination. See above for details.   Pt transported to transport chair requiring mod assist to descent inchair, as pt nearly missed seat. Unaware of safety concerns.    PATIENT EDUCATION: Education details: POC  Person educated: Patient and Spouse Education method: Explanation Education comprehension: verbalized understanding  HOME EXERCISE PROGRAM: Access Code: [email or web address] URL: [email or web address] Date: 10/25/2022 Prepared by: Barrie Folk  Exercises - Seated Hip Abduction with Pelvic Floor Contraction and Resistance Loop  - 1 x daily - 7 x weekly - 3 sets - 10 reps - Sitting Knee Extension with Resistance  - 1 x daily - 7 x weekly - 3 sets - 10 reps - Standing Hip Flexor Stretch  - 1 x daily - 7 x weekly -  3 sets - 10 reps  GOALS: Goals reviewed with patient? Yes  SHORT TERM GOALS: Target date: 11/02/2022  Patient will be independent in home exercise program to improve strength/mobility for better functional independence with ADLs. Baseline: provided on 3/5 Goal status: progressing    LONG TERM GOALS: Target date: 12/28/2022  1.  Patient (> 64 years old) will complete five times sit to stand test in < 30 seconds indicating an increased LE strength and improved  balance. Baseline: 2/14: 73.7 sec with significant UE assistance and not able to obtain full erect posture in standing. 3/19: 36.76 with UE to push from arm rest Goal status: progressing   2.  Patient will increase FOTO score to equal to or greater than  47   to demonstrate statistically significant improvement in mobility and quality of life.  Baseline:2/14: 40 3/19: 50   Goal status: MET    3.  Patient will increase Berg Balance score by > 6 points to demonstrate decreased fall risk during functional activities. Baseline: 33/56  Goal status: progressing    4.  Patient will reduce timed up and go to <25 seconds with LRAD to reduce fall risk and demonstrate improved transfer/gait ability. Baseline: 2/14: 42.45 with SPC and UE assist with stand. 3/19: 55.2 sec with HDRW Goal status: progressing   5.  Patient will increase 10 meter walk test to >1.25m/s as to improve gait speed for better community ambulation and to reduce fall risk. Baseline: 57.80 sec with bariatric walker 3/19: 0.22 m/s (44.31 sec) Goal status: INITIAL    ASSESSMENT:  CLINICAL IMPRESSION:  Pt somewhat flat, but agreeable to PT treatment on this day.   PT assessed progress toward short and long term goals on this day with noted improvement in 83mWalk test, 5xSTS, and FOTO. Progress limited on this day in Berg balance scale and TUG due to L hip pain. Pt reports feeling stronger and safer with gait and transfers since starting PT treatment; wife in agreement of progress, reporting that pt has been more mobile at home with and without RW.  Pt will continue to benefit from skilled therapy to address the deficits and benefit from consistent safety awareness training.    OBJECTIVE IMPAIRMENTS: Abnormal gait, decreased activity tolerance, decreased balance, decreased endurance, decreased knowledge of use of DME, decreased mobility, difficulty walking, decreased strength, decreased safety awareness, impaired perceived functional  ability, and improper body mechanics.   ACTIVITY LIMITATIONS: bending, sitting, standing, squatting, stairs, transfers, bed mobility, bathing, toileting, and locomotion level  PARTICIPATION LIMITATIONS:  Pt reports no participation restrictions when prompted   PERSONAL FACTORS: 3+ comorbidities: HTN, depressed mood ( chart), T2DM  are also affecting patient's functional outcome.   REHAB POTENTIAL: Fair secondary to current functional level and progressive aspect of diagnosis   CLINICAL DECISION MAKING: Evolving/moderate complexity  EVALUATION COMPLEXITY: Moderate  PLAN:  PT FREQUENCY: 2x/week  PT DURATION: 12 weeks  PLANNED INTERVENTIONS: Therapeutic exercises, Therapeutic activity, Neuromuscular re-education, Balance training, Gait training, Patient/Family education, Self Care, Joint mobilization, Stair training, and Manual therapy   PLAN FOR NEXT SESSION:   Perform Berg balance scale. Continue balance training. Endurance training. BLE strengthening.    Barrie Folk PT, DPT  Physical Therapist - [facility]  2:57 PM 11/10/22

## 2022-11-12 ENCOUNTER — Other Ambulatory Visit: Payer: Self-pay | Admitting: Family Medicine

## 2022-11-15 ENCOUNTER — Ambulatory Visit: Payer: 59 | Admitting: Physical Therapy

## 2022-11-15 DIAGNOSIS — R293 Abnormal posture: Secondary | ICD-10-CM

## 2022-11-15 DIAGNOSIS — R2689 Other abnormalities of gait and mobility: Secondary | ICD-10-CM

## 2022-11-15 DIAGNOSIS — R269 Unspecified abnormalities of gait and mobility: Secondary | ICD-10-CM

## 2022-11-15 DIAGNOSIS — R262 Difficulty in walking, not elsewhere classified: Secondary | ICD-10-CM

## 2022-11-15 DIAGNOSIS — M6281 Muscle weakness (generalized): Secondary | ICD-10-CM

## 2022-11-15 DIAGNOSIS — R296 Repeated falls: Secondary | ICD-10-CM

## 2022-11-15 DIAGNOSIS — R2681 Unsteadiness on feet: Secondary | ICD-10-CM

## 2022-11-15 NOTE — Therapy (Signed)
OUTPATIENT PHYSICAL THERAPY NEURO TREATMENT  Patient Name: Kirk Bennett. MRN: [id number] DOB:03-07-1951, 72 y.o., male Today's Date: 11/15/2022   PCP: Ria Bush, MD  REFERRING PROVIDER: Jeanette Caprice,  MD    END OF SESSION:  PT End of Session - 11/15/22 1444     Visit Number 12    Number of Visits 24    Date for PT Re-Evaluation 12/28/22    Progress Note Due on Visit 72    PT Start Time 1438    PT Stop Time 1519    PT Time Calculation (min) 41 min    Equipment Utilized During Treatment Gait belt    Activity Tolerance Patient tolerated treatment well;Patient limited by fatigue    Behavior During Therapy [facility] for tasks assessed/performed               Past Medical History:  Diagnosis Date   Cataracts, bilateral 02/2011   and suspected glaucoma, to return for f/u, no diabetic retinopathy   CKD stage 3 due to type 2 diabetes mellitus ([place]) 2015   normal renal US, self referred to Dr Holley Raring   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 Gabriel Carina endo --> 02/2016 decided to return to PCP for DM care   Past Surgical History:  Procedure Laterality Date   CARDIAC CATHETERIZATION  02/19/2009   No blockages (Dr. Liliane Shi)   COLONOSCOPY  09/16/2005   hyperplastic polyps, rpt due 10 yrs    COLONOSCOPY WITH PROPOFOL N/A 12/11/2015   mult polyps, few TA, rpt 23yrs ([place])   COLONOSCOPY WITH PROPOFOL N/A 09/02/2021   TAs, HPs, int hem, distal ileal  polyposis - benign, rpt yrs([place], Richardo Hanks, DO)   corrective surgery amblyopia  08/23/1955   Cystectomy or meningioma removal brain  08/22/1988   (unclear)   Left knee surgery  08/22/1969   Torn ACL   REPLACEMENT TOTAL KNEE  11/20/2009   Left ([facility] Dr. 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   Irregular heart beat 07/19/2021   BPH (benign prostatic hyperplasia) 10/03/2020   Pain due to onychomycosis of toenails of both feet 06/18/2020   Stage 3 chronic kidney disease ([place]) 10/23/2019   Type 2 diabetes mellitus with diabetic neuropathy, unspecified ([place]) 10/21/2019   Impaired gait and mobility 10/14/2019   Postural dizziness with presyncope 10/13/2019   Ventricular premature complexes 10/13/2019   Urge urinary incontinence 09/26/2019   Depressed mood 09/26/2019   Advanced care planning/counseling discussion 09/22/2018   Weakness of both lower extremities 09/22/2018   History of hepatitis 01/05/2018   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   NAFLD (nonalcoholic fatty liver disease) 09/30/2017   Obesity, Class I, BMI 30.0-34.9 (see actual BMI) 09/18/2017   Polyarthralgia 02/15/2017   OSA (obstructive sleep apnea) 02/15/2017   Partial epilepsy with impairment of consciousness (  [place]) 01/29/2016   Low vitamin B12 level 05/27/2015   Elevated PSA 05/27/2015   Vitamin D deficiency 03/02/2015   Other long term (current) drug therapy 03/12/2012   Encounter for general adult medical examination with abnormal findings 04/26/2011   TOTAL KNEE REPLACEMENT, LEFT, HX OF 04/07/2010   Type 2 diabetes mellitus with diabetic nephropathy ([place]) 03/30/2010   Hyperlipidemia associated with type 2 diabetes mellitus ([place]) 03/30/2010   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 ([place]) 03/30/2010   Essential hypertension, benign 03/30/2010    ONSET DATE:  05/2021  REFERRING DIAG: G20.C (ICD-10-CM) - Parkinsonism, unspecified   THERAPY DIAG:  Abnormality of gait and mobility  Difficulty in walking, not elsewhere classified  Muscle weakness (generalized)  Unsteadiness on feet  Other abnormalities of gait and mobility  Repeated falls  Abnormal posture  Rationale for Evaluation and Treatment: Rehabilitation  SUBJECTIVE:                                                                                                                                                                                             SUBJECTIVE STATEMENT:   Pt reports that he is doing well. Wife not present throughout treatment. Pt reports that he is looking forward to watching [employer] this week.     Pt accompanied by: self  PERTINENT HISTORY: Diabetic neuropathy, atypical PD which MD thinks could be PSP, pt reports diagnosis is parkinson's disease.    PAIN:  Are you having pain? No  PRECAUTIONS: Fall  WEIGHT BEARING RESTRICTIONS: No  FALLS: Has patient fallen in last 6 months? Yes. Number of falls 1 fall when he slippe dand fell getting out of the shower.   LIVING ENVIRONMENT: Lives with: lives with their family and lives with their spouse Lives in: House/apartment Stairs: Yes: Internal: 18, 2 sets steps; on right going up and External: 5 steps; on right going up Has following equipment at home: Single point cane, Walker - 2 wheeled, shower chair, and elevated commode  PLOF: Independent with basic ADLs, Independent with household mobility with device, Independent with community mobility with device, and Needs assistance with homemaking  PATIENT GOALS: Lower and upper body strength as well as balance   OBJECTIVE:   DIAGNOSTIC FINDINGS: No significant findings   COGNITION: Overall cognitive status:  A little flat but otherwise WNL    SENSATION: Not tested  COORDINATION: Not tested   EDEMA:  None   MUSCLE TONE: Not tested, if  in supine may be good to assess if pt has rigidity    POSTURE: rounded shoulders, forward head, and increased thoracic kyphosis  LOWER EXTREMITY ROM:  WNL in seated, in standing pt unable to obtain full erect posture.    LOWER EXTREMITY MMT:    MMT Right Eval Left Eval  Hip flexion 4 4-  Hip extension    Hip abduction 4+ 4+  Hip adduction 4 4  Hip internal rotation    Hip external rotation    Knee flexion 4+ 4+  Knee extension 4+ 4+  Ankle dorsiflexion 4+ 4+  Ankle plantarflexion 5 5  Ankle inversion    Ankle eversion    (Blank rows = not tested) All tested in seated position   BED MOBILITY:  Not tested at eval   TRANSFERS: Assistive device utilized:  UE and cane    Sit to stand: CGA Stand to sit: CGA Chair to chair: CGA Floor:  Not indicated for testing     CURB: Not tested   STAIRS: Not tested at eval, to look at in future assessments    GAIT: Gait pattern: step through pattern, decreased stride length, knee flexed in stance- Right, knee flexed in stance- Left, shuffling, and trunk flexed Distance walked: 30 feet  Assistive device utilized: Single point cane Level of assistance: CGA Comments: uses walls and other ibjects in clinic for steadying   FUNCTIONAL TESTS:  5 times sit to stand:  2/14:73.7 sec with significant UE assist and unable to stand in full erect posture. 3/19: 36.76 with UEpushing from arm rests  Timed up and go (TUG): 2/14: 42.45 sec with SPC and with UE assist on STS transfer. 3/19 55.2 sec with RW and UE assist for chair transfer. 10 meter walk test: 2/14: 57.80 with bariatric walker. 3/19: 44.31sec  Berg Balance Scale: 33/56   PATIENT SURVEYS:  FOTO 2/14: 40. 3/19: 50   TODAY'S TREATMENT: DATE: 11/15/22  Stand pivot tranfers with min assist from transport chair due to poor positioning of BLE and decreased ability to push from arm rests.   Sit<>stand 2 x 5reps with UE supported on RW from slightly elevated mat table.  Supervision-CGA from PT with min cues for improved WB through the forefoot to prevent posterior LOB.   Gait training with RW 2 x 168ft with 1 seated rest break reporting that bariatric RW wasn't "rolling right". PT provided pt with standard width RW and pt reports no more issues. Instruction to improve step length and erect posture throughout   Therex: LAQ x 12 BLE 7.5# Reciprocal march x 10 BLE  Hip abduction x8 7.5# Cues for full ROM and increased speed of movement in concentric phase   PATIENT EDUCATION: Education details: POC  Person educated: Patient and Spouse Education method: Explanation Education comprehension: verbalized understanding  HOME EXERCISE PROGRAM: Access Code: [email or web address] URL: [email or web address] Date: 10/25/2022 Prepared by: Barrie Folk  Exercises - Seated Hip Abduction with Pelvic Floor Contraction and Resistance Loop  - 1 x daily - 7 x weekly - 3 sets - 10 reps - Sitting Knee Extension with Resistance  - 1 x daily - 7 x weekly - 3 sets - 10 reps - Standing Hip Flexor Stretch  - 1 x daily - 7 x weekly - 3 sets - 10 reps  GOALS: Goals reviewed with patient? Yes  SHORT TERM GOALS: Target date: 11/02/2022  Patient will be independent in home exercise program to improve strength/mobility for better functional independence with ADLs. Baseline: provided on 3/5 Goal status: progressing    LONG TERM GOALS: Target date: 12/28/2022  1.  Patient (> 52 years old) will complete five times sit to stand  test in < 30 seconds indicating an increased LE strength and improved balance. Baseline: 2/14: 73.7 sec with significant UE assistance and not able to obtain full erect posture in standing. 3/19: 36.76 with UE to push from arm rest Goal status: progressing   2.  Patient will increase FOTO score to equal to or greater than  47   to demonstrate statistically significant improvement in mobility and quality of life.  Baseline:2/14: 40 3/19: 50   Goal  status: MET    3.  Patient will increase Berg Balance score by > 6 points to demonstrate decreased fall risk during functional activities. Baseline: 33/56  Goal status: progressing    4.  Patient will reduce timed up and go to <25 seconds with LRAD to reduce fall risk and demonstrate improved transfer/gait ability. Baseline: 2/14: 42.45 with SPC and UE assist with stand. 3/19: 55.2 sec with HDRW Goal status: progressing   5.  Patient will increase 10 meter walk test to >1.16m/s as to improve gait speed for better community ambulation and to reduce fall risk. Baseline: 57.80 sec with bariatric walker 3/19: 0.22 m/s (44.31 sec) Goal status: INITIAL    ASSESSMENT:  CLINICAL IMPRESSION:  Pt put forth good effort throughout session on this day. Improved tolerance to standing on this day with completion of gait training `125ft  for multiple bouts. Able to adapt gait and exercise technique following instructions but only able to continue with adjustments for a few steps until additional instruction required.  Pt will continue to benefit from skilled therapy to address the deficits and benefit from consistent safety awareness training.    OBJECTIVE IMPAIRMENTS: Abnormal gait, decreased activity tolerance, decreased balance, decreased endurance, decreased knowledge of use of DME, decreased mobility, difficulty walking, decreased strength, decreased safety awareness, impaired perceived functional ability, and improper body mechanics.   ACTIVITY LIMITATIONS: bending, sitting, standing, squatting, stairs, transfers, bed mobility, bathing, toileting, and locomotion level  PARTICIPATION LIMITATIONS:  Pt reports no participation restrictions when prompted   PERSONAL FACTORS: 3+ comorbidities: HTN, depressed mood ( chart), T2DM  are also affecting patient's functional outcome.   REHAB POTENTIAL: Fair secondary to current functional level and progressive aspect of diagnosis   CLINICAL DECISION MAKING:  Evolving/moderate complexity  EVALUATION COMPLEXITY: Moderate  PLAN:  PT FREQUENCY: 2x/week  PT DURATION: 12 weeks  PLANNED INTERVENTIONS: Therapeutic exercises, Therapeutic activity, Neuromuscular re-education, Balance training, Gait training, Patient/Family education, Self Care, Joint mobilization, Stair training, and Manual therapy   PLAN FOR NEXT SESSION:   BLE strengthening. Continue endurance and gait training as well Perform Berg balance scale.   Barrie Folk PT, DPT  Physical Therapist - [facility]  3:20 PM 11/15/22

## 2022-11-16 ENCOUNTER — Ambulatory Visit (INDEPENDENT_AMBULATORY_CARE_PROVIDER_SITE_OTHER): Payer: 59 | Admitting: Family Medicine

## 2022-11-16 ENCOUNTER — Encounter: Payer: Self-pay | Admitting: Family Medicine

## 2022-11-16 VITALS — BP 126/72 | HR 84 | Temp 97.4°F | Wt 243.4 lb

## 2022-11-16 DIAGNOSIS — I10 Essential (primary) hypertension: Secondary | ICD-10-CM | POA: Diagnosis not present

## 2022-11-16 DIAGNOSIS — G4733 Obstructive sleep apnea (adult) (pediatric): Secondary | ICD-10-CM

## 2022-11-16 DIAGNOSIS — E785 Hyperlipidemia, unspecified: Secondary | ICD-10-CM

## 2022-11-16 DIAGNOSIS — Z23 Encounter for immunization: Secondary | ICD-10-CM

## 2022-11-16 DIAGNOSIS — Z7189 Other specified counseling: Secondary | ICD-10-CM

## 2022-11-16 DIAGNOSIS — E1169 Type 2 diabetes mellitus with other specified complication: Secondary | ICD-10-CM | POA: Diagnosis not present

## 2022-11-16 DIAGNOSIS — G40109 Localization-related (focal) (partial) symptomatic epilepsy and epileptic syndromes with simple partial seizures, not intractable, without status epilepticus: Secondary | ICD-10-CM

## 2022-11-16 DIAGNOSIS — R7989 Other specified abnormal findings of blood chemistry: Secondary | ICD-10-CM

## 2022-11-16 DIAGNOSIS — E66811 Obesity, class 1: Secondary | ICD-10-CM

## 2022-11-16 DIAGNOSIS — K760 Fatty (change of) liver, not elsewhere classified: Secondary | ICD-10-CM

## 2022-11-16 DIAGNOSIS — R972 Elevated prostate specific antigen [PSA]: Secondary | ICD-10-CM

## 2022-11-16 DIAGNOSIS — E1121 Type 2 diabetes mellitus with diabetic nephropathy: Secondary | ICD-10-CM

## 2022-11-16 DIAGNOSIS — G20C Parkinsonism, unspecified: Secondary | ICD-10-CM

## 2022-11-16 DIAGNOSIS — E559 Vitamin D deficiency, unspecified: Secondary | ICD-10-CM

## 2022-11-16 DIAGNOSIS — N183 Type 2 diabetes mellitus with diabetic chronic kidney disease: Secondary | ICD-10-CM

## 2022-11-16 DIAGNOSIS — N3941 Urge incontinence: Secondary | ICD-10-CM

## 2022-11-16 DIAGNOSIS — Z0001 Encounter for general adult medical examination with abnormal findings: Secondary | ICD-10-CM

## 2022-11-16 DIAGNOSIS — R2689 Other abnormalities of gait and mobility: Secondary | ICD-10-CM

## 2022-11-16 DIAGNOSIS — E1122 Type 2 diabetes mellitus with diabetic chronic kidney disease: Secondary | ICD-10-CM

## 2022-11-16 DIAGNOSIS — E1140 Type 2 diabetes mellitus with diabetic neuropathy, unspecified: Secondary | ICD-10-CM

## 2022-11-16 DIAGNOSIS — E669 Obesity, unspecified: Secondary | ICD-10-CM

## 2022-11-16 MED ORDER — BLOOD GLUCOSE TEST VI STRP: 1.0000 | ORAL_STRIP | Freq: Three times a day (TID) | 3 refills | Status: AC

## 2022-11-16 MED ORDER — ATORVASTATIN CALCIUM 20 MG PO TABS: 20.0000 mg | ORAL_TABLET | Freq: Every day | ORAL | 4 refills | Status: DC

## 2022-11-16 MED ORDER — LANCET DEVICE MISC: 1.0000 | Freq: Three times a day (TID) | 0 refills | Status: AC

## 2022-11-16 MED ORDER — DAPAGLIFLOZIN PROPANEDIOL 10 MG PO TABS: 10.0000 mg | ORAL_TABLET | Freq: Every day | ORAL | 4 refills | Status: DC

## 2022-11-16 MED ORDER — VITAMIN B-12 1000 MCG PO TABS: 1000.0000 ug | ORAL_TABLET | ORAL | Status: DC

## 2022-11-16 MED ORDER — TRULICITY 0.75 MG/0.5ML ~~LOC~~ SOAJ: 0.7500 mg | SUBCUTANEOUS | 4 refills | Status: DC

## 2022-11-16 MED ORDER — METFORMIN HCL 1000 MG PO TABS: ORAL_TABLET | ORAL | 4 refills | Status: DC

## 2022-11-16 MED ORDER — BLOOD GLUCOSE MONITORING SUPPL DEVI: 1.0000 | Freq: Three times a day (TID) | 0 refills | Status: AC

## 2022-11-16 MED ORDER — LANCETS MISC. MISC: 1.0000 | Freq: Three times a day (TID) | 3 refills | Status: AC

## 2022-11-16 MED ORDER — LISINOPRIL 10 MG PO TABS: 10.0000 mg | ORAL_TABLET | Freq: Two times a day (BID) | ORAL | 4 refills | Status: DC

## 2022-11-16 MED ORDER — GLIMEPIRIDE 4 MG PO TABS: 4.0000 mg | ORAL_TABLET | Freq: Every day | ORAL | 4 refills | Status: DC

## 2022-11-16 NOTE — Assessment & Plan Note (Addendum)
Advanced directive: has this at home - in process of updating. Wife is HCPOA. Full code, but would not want prolonged life support if terminal condition. Ok with feeding tube. Asked to bring [place] copy.

## 2022-11-16 NOTE — Progress Notes (Signed)
Patient ID: Kirk Buttery., male    DOB: August 11, 1951, 72 y.o.   MRN: [id number]  This visit was conducted in person.  BP 126/72   Pulse 84   Temp (!) 97.4 F (36.3 C)   Wt 243 lb 6 oz (110.4 kg)   SpO2 93%   BMI 31.25 kg/m    CC: CPE Subjective:   HPI: Kirk Dumas. is a 72 y.o. male presenting on 11/16/2022 for Annual Exam (Pt accompanied by wife, Kirk Lieu. )   Has medicare part A.   No results found.  [place] Office Visit from 11/16/2022 in [place] [facility] [place]  PHQ-2 Total Score 0          11/16/2022   10:42 AM  Fall Risk   Falls in the past year? 0   Overactive bladder with urgency and urge incontinence previously saw urology, s/p PTNS x12 with limited noted benefit. Now off medication, no longer seeing uro.  He is not taking flomax. Requests condom cath Rx due to ongoing incontinence.   DM - stable period on metformin, amaryl, farxiga. Trulicity 0.75mg  weekly started last year - ran out but needs refill.  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 Had abnormal DatScan 04/2021. 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  Continues PT, latest session yesterday.   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 Saw new epileptologist last year Dr Haydee Monica NP.   Visual hallucinations have resolved.    Preventative: COLONOSCOPY WITH PROPOFOL 12/11/2015 mult polyps, few TA, rpt 21yrs ([place]) - postponed due to pandemic COLONOSCOPY WITH PROPOFOL 09/02/2021 - TAs, HPs, int hem, distal ileal polyposis - benign, rpt 5 yrs Kirk Helling, DO) Prostate cancer screening - PSA increased to 4.4 last year, referred  to urology with stable DRE and decreased PSA back to normal range in interim. Rec yearly monitoring at PCP's office.  Lung cancer screening - not eligible  Flu shot yearly COVID vaccine Moderna 08/2019, 09/2019, Moderna booster 06/2020, 01/2021 Tdap - 04/2011 , rpt today 10/2022  Pneumovax 2013, prevnar-13 04/2018  Had hepatitis at age 7yo (?A), hospitalized [facility] [place].   No hep B in past.  Shingrix - interested but declines for today  Advanced directive: has this at home - in process of updating. Wife is HCPOA. Full code, but would not want prolonged life support if terminal condition. Ok with feeding tube. Asked to bring [place] copy.  Seat belt use discussed.  Sunscreen use discussed. R groin mole enlarging.  Non smoker Alcohol - none  Dentist q6 mo  Eye exam yearly  Bowel - no constipation Bladder - ongoing urge incontinence over last few years - uses depends. Occasional full uncontrollable emptying. No stress incontinence symptoms. Seeing urology as per above.    Caffeine - limited Lives with wife and granddaughter, 1 cat Occupation: retired, worked Physicist, medical   Activity: has stationary bicycle at home  Diet: good water, good vegetables, follows diabetic diet      Relevant past medical, surgical, family and social history reviewed and updated as indicated. Interim medical history since our last visit reviewed. Allergies and medications reviewed and updated. Outpatient Medications Prior to Visit  Medication  Sig Dispense Refill   carbidopa-levodopa (SINEMET IR) 25-100 MG tablet PLEASE SEE ATTACHED FOR DETAILED DIRECTIONS     Cholecalciferol (VITAMIN D3) 1000 units CAPS Take 1,000 Units by mouth daily.     clotrimazole (LOTRIMIN AF) 1 % cream Apply 1 application topically 2 (two) times daily. 113 g 0   glucose blood (ONE TOUCH ULTRA TEST) test strip Use to check sugar fasting in the AM and 2 hours after lunch or supper. Dx: E11.21 100 each 3   levETIRAcetam (KEPPRA) 1000 MG  tablet Take 1,000 mg by mouth 2 (two) times daily.     Multiple Vitamin (MULTIVITAMIN ADULT) TABS Take 1 tablet by mouth daily.     atorvastatin (LIPITOR) 20 MG tablet TAKE 1 TABLET BY MOUTH EVERY DAY 90 tablet 0   dapagliflozin propanediol (FARXIGA) 10 MG TABS tablet Take 1 tablet (10 mg total) by mouth daily. 30 tablet 6   glimepiride (AMARYL) 4 MG tablet TAKE 1 TABLET BY MOUTH DAILY WITH BREAKFAST 90 tablet 0   lisinopril (ZESTRIL) 10 MG tablet TAKE 1 TABLET BY MOUTH TWICE A DAY 180 tablet 0   metFORMIN (GLUCOPHAGE) 1000 MG tablet TAKE 0.5 TABLETS (500 MG) BY MOUTH DAILY WITH BREAKFAST AND 1 TABLET (1,000 MG) DAILY WITH SUPPER. 135 tablet 0   tamsulosin (FLOMAX) 0.4 MG CAPS capsule Take 1 capsule (0.4 mg total) by mouth daily. Take 30 minutes after the same meal each day 30 capsule 11   vitamin B-12 (CYANOCOBALAMIN) 1000 MCG tablet Take 1 tablet (1,000 mcg total) by mouth daily.     Facility-Administered Medications Prior to Visit  Medication Dose Route Frequency Provider Last Rate Last Admin   lidocaine (XYLOCAINE) 2 % jelly 1 application  1 application  Urethral Once Billey Co, MD         Per HPI unless specifically indicated in ROS section below Review of Systems  Constitutional:  Negative for activity change, appetite change, chills, fatigue, fever and unexpected weight change.  HENT:  Negative for hearing loss.   Eyes:  Negative for visual disturbance.  Respiratory:  Negative for cough, chest tightness, shortness of breath and wheezing.   Cardiovascular:  Negative for chest pain, palpitations and leg swelling.  Gastrointestinal:  Negative for abdominal distention, abdominal pain, blood in stool, constipation, diarrhea, nausea and vomiting.  Genitourinary:  Positive for urgency. Negative for difficulty urinating, dysuria and hematuria.       Ongoing chronic incontinence, no benefit on flomax so now off this  Musculoskeletal:  Negative for arthralgias, myalgias and neck pain.   Skin:  Negative for rash.  Neurological:  Negative for dizziness, seizures, syncope and headaches.  Hematological:  Negative for adenopathy. Does not bruise/bleed easily.  Psychiatric/Behavioral:  Negative for dysphoric mood. The patient is not nervous/anxious.     Objective:  BP 126/72   Pulse 84   Temp (!) 97.4 F (36.3 C)   Wt 243 lb 6 oz (110.4 kg)   SpO2 93%   BMI 31.25 kg/m   Wt Readings from Last 3 Encounters:  11/16/22 243 lb 6 oz (110.4 kg)  07/29/22 240 lb 6.4 oz (109 kg)  04/29/22 236 lb (107 kg)      Physical Exam Vitals and nursing note reviewed.  Constitutional:      General: He is not in acute distress.    Appearance: Normal appearance. He is well-developed. He is not ill-appearing.     Comments: Sitting in wheelchair  HENT:     Head: Normocephalic and  atraumatic.     Right Ear: Hearing, tympanic membrane, ear canal and external ear normal.     Left Ear: Hearing, tympanic membrane, ear canal and external ear normal.     Nose: Nose normal.     Mouth/Throat:     Mouth: Mucous membranes are moist.     Pharynx: Oropharynx is clear. No oropharyngeal exudate or posterior oropharyngeal erythema.  Eyes:     General: No scleral icterus.    Extraocular Movements: Extraocular movements intact.     Conjunctiva/sclera: Conjunctivae normal.     Pupils: Pupils are equal, round, and reactive to light.  Neck:     Thyroid: No thyroid mass or thyromegaly.  Cardiovascular:     Rate and Rhythm: Normal rate and regular rhythm.     Pulses: Normal pulses.          Radial pulses are 2+ on the right side and 2+ on the left side.     Heart sounds: Normal heart sounds. No murmur heard. Pulmonary:     Effort: Pulmonary effort is normal. No respiratory distress.     Breath sounds: Normal breath sounds. No wheezing, rhonchi or rales.  Abdominal:     General: Bowel sounds are normal. There is no distension.     Palpations: Abdomen is soft. There is no mass.     Tenderness: There  is no abdominal tenderness. There is no guarding or rebound.     Hernia: No hernia is present.  Musculoskeletal:        General: Normal range of motion.     Cervical back: Normal range of motion and neck supple.     Right lower leg: No edema.     Left lower leg: No edema.  Lymphadenopathy:     Cervical: No cervical adenopathy.  Skin:    General: Skin is warm and dry.     Findings: No rash.  Neurological:     General: No focal deficit present.     Mental Status: He is alert and oriented to person, place, and time.  Psychiatric:        Mood and Affect: Mood normal.        Behavior: Behavior normal.        Thought Content: Thought content normal.        Judgment: Judgment normal.       Results for orders placed or performed in visit on 11/09/22  PSA  Result Value Ref Range   PSA 1.35 0.10 - 4.00 ng/mL  Vitamin B12  Result Value Ref Range   Vitamin B-12 1,217 (H) 211 - 911 pg/mL  CBC with Differential/Platelet  Result Value Ref Range   WBC 6.8 4.0 - 10.5 K/uL   RBC 5.06 4.22 - 5.81 Mil/uL   Hemoglobin 14.7 13.0 - 17.0 g/dL   HCT 44.4 39.0 - 52.0 %   MCV 87.8 78.0 - 100.0 fl   MCHC 33.0 30.0 - 36.0 g/dL   RDW 12.8 11.5 - 15.5 %   Platelets 209.0 150.0 - 400.0 K/uL   Neutrophils Relative % 56.6 43.0 - 77.0 %   Lymphocytes Relative 32.4 12.0 - 46.0 %   Monocytes Relative 6.4 3.0 - 12.0 %   Eosinophils Relative 4.1 0.0 - 5.0 %   Basophils Relative 0.5 0.0 - 3.0 %   Neutro Abs 3.8 1.4 - 7.7 K/uL   Lymphs Abs 2.2 0.7 - 4.0 K/uL   Monocytes Absolute 0.4 0.1 - 1.0 K/uL   Eosinophils Absolute 0.3 0.0 -  0.7 K/uL   Basophils Absolute 0.0 0.0 - 0.1 K/uL  VITAMIN D 25 Hydroxy (Vit-D Deficiency, Fractures)  Result Value Ref Range   VITD 29.97 (L) 30.00 - 100.00 ng/mL  Phosphorus  Result Value Ref Range   Phosphorus 3.7 2.3 - 4.6 mg/dL  Hemoglobin A1c  Result Value Ref Range   Hgb A1c MFr Bld 7.6 (H) 4.6 - 6.5 %  Comprehensive metabolic panel  Result Value Ref Range   Sodium  137 135 - 145 mEq/L   Potassium 4.1 3.5 - 5.1 mEq/L   Chloride 104 96 - 112 mEq/L   CO2 23 19 - 32 mEq/L   Glucose, Bld 192 (H) 70 - 99 mg/dL   BUN 21 6 - 23 mg/dL   Creatinine, Ser 1.64 (H) 0.40 - 1.50 mg/dL   Total Bilirubin 1.0 0.2 - 1.2 mg/dL   Alkaline Phosphatase 115 39 - 117 U/L   AST 10 0 - 37 U/L   ALT 2 0 - 53 U/L   Total Protein 6.7 6.0 - 8.3 g/dL   Albumin 4.1 3.5 - 5.2 g/dL   GFR 42.34 (L) >60.00 mL/min   Calcium 9.3 8.4 - 10.5 mg/dL  Lipid panel  Result Value Ref Range   Cholesterol 127 0 - 200 mg/dL   Triglycerides 109.0 0.0 - 149.0 mg/dL   HDL 38.10 (L) >39.00 mg/dL   VLDL 21.8 0.0 - 40.0 mg/dL   LDL Cholesterol 67 0 - 99 mg/dL   Total CHOL/HDL Ratio 3    NonHDL 88.73    Lab Results  Component Value Date   PSA 1.35 11/09/2022   PSA 0.71 10/19/2021   PSA 1.21 09/25/2020   Assessment & Plan:   Problem List Items Addressed This Visit     Encounter for general adult medical examination with abnormal findings - Primary (Chronic)    Preventative protocols reviewed and updated unless pt declined. Discussed healthy diet and lifestyle.       Advanced care planning/counseling discussion (Chronic)    Advanced directive: has this at home - in process of updating. Wife is HCPOA. Full code, but would not want prolonged life support if terminal condition. Ok with feeding tube. Asked to bring [place] copy.       Type 2 diabetes mellitus with diabetic nephropathy (HCC)    Chronic, uncontrolled - did run out of trulicity - will refill, along with continued amaryl 4mg , metformin 500/1000mg  daily and farxiga 10mg  daily. RTC 4 mo DM f/u visit. Reassess control back on trulicity.  Requests Rx for now glucometer/strips - sent.       Relevant Medications   atorvastatin (LIPITOR) 20 MG tablet   lisinopril (ZESTRIL) 10 MG tablet   dapagliflozin propanediol (FARXIGA) 10 MG TABS tablet   glimepiride (AMARYL) 4 MG tablet   metFORMIN (GLUCOPHAGE) 1000 MG tablet   Dulaglutide  (TRULICITY) [id number] 0000000 SOPN   Hyperlipidemia associated with type 2 diabetes mellitus (HCC)    Chronic, stable on atorvastatin 20mg  daily - continue. LDL 67 at goal . 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       Essential hypertension, benign    Chronic, stable period on lisinopril 10mg  bid.       Relevant Medications   atorvastatin (LIPITOR) 20 MG tablet   lisinopril (ZESTRIL) 10 MG tablet   CKD stage 3 due to type 2 diabetes mellitus (HCC)    Chronic, mild deterioration in GFR to 42 - will continue working towards better glycemic control.       Relevant Medications   atorvastatin (LIPITOR) 20 MG tablet   lisinopril (ZESTRIL) 10 MG tablet   dapagliflozin propanediol (FARXIGA) 10 MG TABS tablet   glimepiride (AMARYL) 4 MG tablet   metFORMIN (GLUCOPHAGE) 1000 MG tablet   Dulaglutide (TRULICITY) [id number] 0000000 SOPN   Vitamin D deficiency    Levels low normal -continue daily replacement      Low vitamin B12 level    Levels now high - drop b12 orally to once weekly.       Elevated PSA    Levels remain normal range - continue yearly check      OSA (obstructive sleep apnea)    H/o this, unable to tolerate CPAP.       Obesity, Class I, BMI 30.0-34.9 (see actual BMI)    Encourage healthy diet and lifestyle changes to affect sustainable weight loss.        NAFLD (nonalcoholic fatty liver disease)   Urge urinary incontinence    Mod-severe OAB  previously followed by urology. Now off flomax.  Requests Rx condom caths - intermittent use predominantly when leaving the house.  Rx written.       Impaired gait and mobility    Continues outpatient PT.       Type 2 diabetes mellitus with diabetic neuropathy, unspecified (HCC)   Relevant Medications   atorvastatin (LIPITOR) 20 MG tablet   lisinopril (ZESTRIL) 10 MG tablet   dapagliflozin propanediol (FARXIGA) 10 MG TABS tablet   glimepiride (AMARYL) 4 MG tablet   metFORMIN (GLUCOPHAGE) 1000 MG tablet   Dulaglutide (TRULICITY) [id number] 0000000 SOPN   Atypical parkinsonism    Appreciate movement disorder [facility] at [facility] care (Dr Laurance Flatten).  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  Now on sinemet.       Other Visit Diagnoses     Need for Tdap vaccination       Relevant Orders   Tdap vaccine greater than or equal to 7yo IM (Completed)        Meds ordered this encounter  Medications   atorvastatin (LIPITOR) 20 MG tablet    Sig: Take 1 tablet (20 mg total) by mouth daily.    Dispense:  90 tablet    Refill:  4   lisinopril (ZESTRIL) 10 MG tablet    Sig: Take 1 tablet (10 mg total) by mouth 2 (two) times daily.    Dispense:  180 tablet    Refill:  4   dapagliflozin propanediol (FARXIGA) 10 MG TABS tablet    Sig: Take 1 tablet (10 mg total) by mouth daily.    Dispense:  90 tablet    Refill:  4   glimepiride (AMARYL) 4 MG tablet    Sig: Take 1 tablet (4 mg total) by mouth daily with breakfast.    Dispense:  90 tablet    Refill:  4   metFORMIN (GLUCOPHAGE) 1000 MG tablet    Sig: Take 0.5 tablets (500 mg  total) by mouth daily with breakfast AND 1 tablet (1,000 mg total) daily with supper.    Dispense:  135 tablet    Refill:  4   Dulaglutide (TRULICITY) [id number] 0000000 SOPN    Sig: Inject 0.75 mg into the skin once a week.    Dispense:  6 mL    Refill:  4   cyanocobalamin (VITAMIN B12) 1000 MCG tablet    Sig: Take 1 tablet (1,000 mcg total) by mouth 2 (two) times a week.   Blood  Glucose Monitoring Suppl DEVI    Sig: 1 each by Does not apply route in the morning, at noon, and at bedtime. May substitute to any manufacturer covered by patient's insurance.    Dispense:  1 each    Refill:  0   Glucose Blood (BLOOD GLUCOSE TEST STRIPS) STRP    Sig: 1 each by In Vitro route in the morning, at noon, and at bedtime. May substitute to any manufacturer covered by patient's insurance.    Dispense:  100 strip    Refill:  3   Lancet Device MISC    Sig: 1 each by Does not apply route in the morning, at noon, and at bedtime. May substitute to any manufacturer covered by patient's insurance.    Dispense:  1 each    Refill:  0   Lancets Misc. MISC    Sig: 1 each by Does not apply route in the morning, at noon, and at bedtime. May substitute to any manufacturer covered by patient's insurance.    Dispense:  100 each    Refill:  3    Orders Placed This Encounter  Procedures   Tdap vaccine greater than or equal to 7yo IM    Patient Instructions  Prescription written for condom cath to take to local medical supply company.  Tetanus shot today (Tdap) Bring [place] copy of your living will. Let us know name of eye drops.  Drop vitamin b12 to twice weekly dosing.  You are doing well today Return as needed or in 4 months for diabetes follow up visit.   Follow up plan: Return in about 4 months (around 03/18/2023) for follow up visit.  Ria Bush, MD

## 2022-11-16 NOTE — Assessment & Plan Note (Signed)
Preventative protocols reviewed and updated unless pt declined. Discussed healthy diet and lifestyle.  

## 2022-11-16 NOTE — Patient Instructions (Addendum)
Prescription written for condom cath to take to local medical supply company.  Tetanus shot today (Tdap) Bring [place] copy of your living will. Let us know name of eye drops.  Drop vitamin b12 to twice weekly dosing.  You are doing well today Return as needed or in 4 months for diabetes follow up visit.

## 2022-11-17 ENCOUNTER — Ambulatory Visit: Payer: 59 | Admitting: Physical Therapy

## 2022-11-17 DIAGNOSIS — R262 Difficulty in walking, not elsewhere classified: Secondary | ICD-10-CM

## 2022-11-17 DIAGNOSIS — M6281 Muscle weakness (generalized): Secondary | ICD-10-CM

## 2022-11-17 DIAGNOSIS — R269 Unspecified abnormalities of gait and mobility: Secondary | ICD-10-CM | POA: Diagnosis not present

## 2022-11-17 DIAGNOSIS — R2681 Unsteadiness on feet: Secondary | ICD-10-CM

## 2022-11-17 DIAGNOSIS — R2689 Other abnormalities of gait and mobility: Secondary | ICD-10-CM

## 2022-11-17 NOTE — Therapy (Signed)
OUTPATIENT PHYSICAL THERAPY NEURO TREATMENT  Patient Name: Kirk Bennett. MRN: [id number] DOB:08/04/1951, 72 y.o., male Today's Date: 11/17/2022   PCP: Ria Bush, MD  REFERRING PROVIDER: Jeanette Caprice,  MD    END OF SESSION:  PT End of Session - 11/17/22 1347     Visit Number 13    Number of Visits 24    Date for PT Re-Evaluation 12/28/22    Progress Note Due on Visit 10    PT Start Time [id number]    PT Stop Time 1428    PT Time Calculation (min) 40 min    Equipment Utilized During Treatment Gait belt    Activity Tolerance Patient tolerated treatment well;Patient limited by fatigue    Behavior During Therapy [facility] for tasks assessed/performed                Past Medical History:  Diagnosis Date   Cataracts, bilateral 02/2011   and suspected glaucoma, to return for f/u, no diabetic retinopathy   CKD stage 3 due to type 2 diabetes mellitus ([place]) 2015   normal renal US, self referred to Dr Holley Raring   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 Gabriel Carina endo --> 02/2016 decided to return to PCP for DM care   Past Surgical History:  Procedure Laterality Date   CARDIAC CATHETERIZATION  02/19/2009   No blockages (Dr. Liliane Shi)   COLONOSCOPY  09/16/2005   hyperplastic polyps, rpt due 10 yrs    COLONOSCOPY WITH PROPOFOL N/A 12/11/2015   mult polyps, few TA, rpt 29yrs ([place])   COLONOSCOPY WITH PROPOFOL N/A 09/02/2021   TAs, HPs, int hem, distal ileal  polyposis - benign, rpt yrs([place], Richardo Hanks, DO)   corrective surgery amblyopia  08/23/1955   Cystectomy or meningioma removal brain  08/22/1988   (unclear)   Left knee surgery  08/22/1969   Torn ACL   REPLACEMENT TOTAL KNEE  11/20/2009   Left ([facility] Dr. 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   Irregular heart beat 07/19/2021   BPH (benign prostatic hyperplasia) 10/03/2020   Pain due to onychomycosis of toenails of both feet 06/18/2020   Stage 3 chronic kidney disease ([place]) 10/23/2019   Type 2 diabetes mellitus with diabetic neuropathy, unspecified ([place]) 10/21/2019   Impaired gait and mobility 10/14/2019   Postural dizziness with presyncope 10/13/2019   Ventricular premature complexes 10/13/2019   Urge urinary incontinence 09/26/2019   Depressed mood 09/26/2019   Advanced care planning/counseling discussion 09/22/2018   Weakness of both lower extremities 09/22/2018   History of hepatitis 01/05/2018   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   NAFLD (nonalcoholic fatty liver disease) 09/30/2017   Obesity, Class I, BMI 30.0-34.9 (see actual BMI) 09/18/2017   Polyarthralgia 02/15/2017   OSA (obstructive sleep apnea) 02/15/2017   Partial epilepsy with impairment of  consciousness ([place]) 01/29/2016   Low vitamin B12 level 05/27/2015   Elevated PSA 05/27/2015   Vitamin D deficiency 03/02/2015   Other long term (current) drug therapy 03/12/2012   Encounter for general adult medical examination with abnormal findings 04/26/2011   TOTAL KNEE REPLACEMENT, LEFT, HX OF 04/07/2010   Type 2 diabetes mellitus with diabetic nephropathy ([place]) 03/30/2010   Hyperlipidemia associated with type 2 diabetes mellitus ([place]) 03/30/2010   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 ([place]) 03/30/2010   Essential hypertension, benign 03/30/2010    ONSET DATE:  05/2021  REFERRING DIAG: G20.C (ICD-10-CM) - Parkinsonism, unspecified   THERAPY DIAG:  Abnormality of gait and mobility  Difficulty in walking, not elsewhere classified  Muscle weakness (generalized)  Unsteadiness on feet  Other abnormalities of gait and mobility  Rationale for Evaluation and Treatment: Rehabilitation  SUBJECTIVE:                                                                                                                                                                                             SUBJECTIVE STATEMENT:   Pt reports that he is doing well. No changes to reports since last session.     Pt accompanied by: self  PERTINENT HISTORY: Diabetic neuropathy, atypical PD which MD thinks could be PSP, pt reports diagnosis is parkinson's disease.    PAIN:  Are you having pain? No  PRECAUTIONS: Fall  WEIGHT BEARING RESTRICTIONS: No  FALLS: Has patient fallen in last 6 months? Yes. Number of falls 1 fall when he slippe dand fell getting out of the shower.   LIVING ENVIRONMENT: Lives with: lives with their family and lives with their spouse Lives in: House/apartment Stairs: Yes: Internal: 77, 2 sets steps; on right going up and External: 5 steps; on right going up Has following equipment at home: Single point cane, Walker - 2 wheeled, shower chair, and elevated commode  PLOF: Independent with basic ADLs, Independent with household mobility with device, Independent with community mobility with device, and Needs assistance with homemaking  PATIENT GOALS: Lower and upper body strength as well as balance   OBJECTIVE:   DIAGNOSTIC FINDINGS: No significant findings   COGNITION: Overall cognitive status:  A little flat but otherwise WNL    SENSATION: Not tested  COORDINATION: Not tested   EDEMA:  None   MUSCLE TONE: Not tested, if in supine may be good to assess if pt has rigidity    POSTURE: rounded shoulders, forward head, and increased  thoracic kyphosis  LOWER EXTREMITY ROM:   WNL in seated, in standing pt unable to obtain full erect posture.  LOWER EXTREMITY MMT:    MMT Right Eval Left Eval  Hip flexion 4 4-  Hip extension    Hip abduction 4+ 4+  Hip adduction 4 4  Hip internal rotation    Hip external rotation    Knee flexion 4+ 4+  Knee extension 4+ 4+  Ankle dorsiflexion 4+ 4+  Ankle plantarflexion 5 5  Ankle inversion    Ankle eversion    (Blank rows = not tested) All tested in seated position   BED MOBILITY:  Not tested at eval   TRANSFERS: Assistive device utilized:  UE and cane    Sit to stand: CGA Stand to sit: CGA Chair to chair: CGA Floor:  Not indicated for testing     CURB: Not tested   STAIRS: Not tested at eval, to look at in future assessments    GAIT: Gait pattern: step through pattern, decreased stride length, knee flexed in stance- Right, knee flexed in stance- Left, shuffling, and trunk flexed Distance walked: 30 feet  Assistive device utilized: Single point cane Level of assistance: CGA Comments: uses walls and other ibjects in clinic for steadying   FUNCTIONAL TESTS:  5 times sit to stand:  2/14:73.7 sec with significant UE assist and unable to stand in full erect posture. 3/19: 36.76 with UEpushing from arm rests  Timed up and go (TUG): 2/14: 42.45 sec with SPC and with UE assist on STS transfer. 3/19 55.2 sec with RW and UE assist for chair transfer. 10 meter walk test: 2/14: 57.80 with bariatric walker. 3/19: 44.31sec  Berg Balance Scale: 33/56   PATIENT SURVEYS:  FOTO 2/14: 40. 3/19: 50   TODAY'S TREATMENT: DATE: 11/17/22 TA Stand pivot tranfers with min assist from transport chair due to poor positioning of BLE and decreased ability to push from arm rests.   Modified PWR! Up seated focusing on forward weight shift for STS transitions later in therapy due to significant lack of weigh shift earlier in session.   Sit<>stand 3 x 5reps with UE supported on RW  from slightly elevated standard chair plus airex pad . Supervision-CGA from PT with min cues. Good ability to use   Ambulation training with RW 2 x 39ft with 1 seated rest break with standard walker, fatigue noted and pt requests rest break instead.   Therex: LAQ 2*10  BLE 7.5# Reciprocal march x 10 BLE with  Hip abduction GTB x 15 reps  Cues for full ROM and increased speed of movement in concentric phase   Unless otherwise stated, CGA was provided and gait belt donned in order to ensure pt safety   PATIENT EDUCATION: Education details: POC  Person educated: Patient and Spouse Education method: Explanation Education comprehension: verbalized understanding  HOME EXERCISE PROGRAM: Access Code: [email or web address] URL: [email or web address] Date: 10/25/2022 Prepared by: Barrie Folk  Exercises - Seated Hip Abduction with Pelvic Floor Contraction and Resistance Loop  - 1 x daily - 7 x weekly - 3 sets - 10 reps - Sitting Knee Extension with Resistance  - 1 x daily - 7 x weekly - 3 sets - 10 reps - Standing Hip Flexor Stretch  - 1 x daily - 7 x weekly - 3 sets - 10 reps  GOALS: Goals reviewed with patient? Yes  SHORT TERM GOALS: Target date: 11/02/2022  Patient will be independent in home exercise program to improve strength/mobility for better functional independence with ADLs. Baseline: provided on 3/5 Goal status: progressing    LONG TERM GOALS: Target date: 12/28/2022  1.  Patient (> 64 years old) will complete five times sit to stand test in < 30 seconds indicating an increased LE strength and improved balance. Baseline: 2/14: 73.7 sec with significant UE assistance and not able to obtain full erect posture in standing. 3/19: 36.76 with UE to push from arm rest Goal status: progressing   2.  Patient will increase FOTO score to equal to or greater than  47   to demonstrate statistically significant improvement in mobility and quality of life.  Baseline:2/14: 40 3/19: 50    Goal status: MET    3.  Patient will increase Berg Balance score by > 6 points to demonstrate decreased fall risk during functional activities. Baseline: 33/56  Goal status: progressing    4.  Patient will reduce timed up and go to <25 seconds with LRAD to reduce fall risk and demonstrate improved transfer/gait ability. Baseline: 2/14: 42.45 with SPC and UE assist with stand. 3/19: 55.2 sec with HDRW Goal status: progressing   5.  Patient will increase 10 meter walk test to >1.1m/s as to improve gait speed for better community ambulation and to reduce fall risk. Baseline: 57.80 sec with bariatric walker 3/19: 0.22 m/s (44.31 sec) Goal status: INITIAL    ASSESSMENT:  CLINICAL IMPRESSION:  Pt put forth good effort throughout session on this day. Demonstrated improved ability to transition from sit to stand following practice and cues for forward weight shifting. Pt had increased difficulty with gait this date requiring rest break at earlier interval. Pt will continue to benefit from skilled physical therapy intervention to address impairments, improve QOL, and attain therapy goals.    OBJECTIVE IMPAIRMENTS: Abnormal gait, decreased activity tolerance, decreased balance, decreased endurance, decreased knowledge of use of DME, decreased mobility, difficulty walking, decreased strength, decreased safety awareness, impaired perceived functional ability, and improper body mechanics.   ACTIVITY LIMITATIONS: bending, sitting, standing, squatting, stairs, transfers, bed mobility, bathing, toileting, and locomotion level  PARTICIPATION LIMITATIONS:  Pt reports no participation restrictions when prompted   PERSONAL FACTORS: 3+ comorbidities: HTN, depressed mood ( chart), T2DM  are also affecting patient's functional outcome.   REHAB POTENTIAL: Fair secondary to current functional level and progressive aspect of diagnosis   CLINICAL DECISION MAKING: Evolving/moderate complexity  EVALUATION  COMPLEXITY: Moderate  PLAN:  PT FREQUENCY: 2x/week  PT DURATION: 12 weeks  PLANNED INTERVENTIONS: Therapeutic exercises, Therapeutic activity, Neuromuscular re-education, Balance training, Gait training, Patient/Family education, Self Care, Joint mobilization, Stair training, and Manual therapy   PLAN FOR NEXT SESSION:   BLE strengthening. Continue endurance and gait training, attempt BERG balance scale    Particia Lather PT ,DPT Physical Therapist- [facility]   1:48 PM 11/17/22

## 2022-11-18 ENCOUNTER — Encounter: Payer: Self-pay | Admitting: Family Medicine

## 2022-11-18 NOTE — Assessment & Plan Note (Addendum)
Mod-severe OAB previously followed by urology. Now off flomax.  Requests Rx condom caths - intermittent use predominantly when leaving the house.  Rx written.

## 2022-11-18 NOTE — Assessment & Plan Note (Addendum)
Chronic, uncontrolled - did run out of trulicity - will refill, along with continued amaryl 4mg , metformin 500/1000mg  daily and farxiga 10mg  daily. RTC 4 mo DM f/u visit. Reassess control back on trulicity.  Requests Rx for now glucometer/strips - sent.

## 2022-11-18 NOTE — Assessment & Plan Note (Signed)
Appreciate movement disorder clinic at [facility] care (Dr Laurance Flatten).  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  Now on sinemet.

## 2022-11-18 NOTE — Assessment & Plan Note (Addendum)
Chronic, stable on atorvastatin 20mg  daily - continue. LDL 67 at goal . The ASCVD Risk score (Arnett DK, et al., 2019) failed to calculate for the following reasons:   The valid total cholesterol range is 130 to 320 mg/dL

## 2022-11-18 NOTE — Assessment & Plan Note (Addendum)
Continues outpatient PT.  

## 2022-11-18 NOTE — Assessment & Plan Note (Signed)
Levels now high - drop b12 orally to once weekly.

## 2022-11-18 NOTE — Assessment & Plan Note (Signed)
H/o this, unable to tolerate CPAP.

## 2022-11-18 NOTE — Assessment & Plan Note (Signed)
Levels remain normal range - continue yearly check

## 2022-11-18 NOTE — Assessment & Plan Note (Signed)
Appreciate [facility] epileptologist care  - continue keppra 100mg  bid.

## 2022-11-18 NOTE — Assessment & Plan Note (Addendum)
Encourage healthy diet and lifestyle changes to affect sustainable weight loss.  

## 2022-11-18 NOTE — Assessment & Plan Note (Signed)
Levels low normal -continue daily replacement

## 2022-11-18 NOTE — Assessment & Plan Note (Signed)
Chronic, stable period on lisinopril 10mg  bid.

## 2022-11-18 NOTE — Assessment & Plan Note (Signed)
Chronic, mild deterioration in GFR to 42 - will continue working towards better glycemic control.

## 2022-11-22 ENCOUNTER — Ambulatory Visit: Payer: 59 | Attending: Neurology | Admitting: Physical Therapy

## 2022-11-22 DIAGNOSIS — R269 Unspecified abnormalities of gait and mobility: Secondary | ICD-10-CM | POA: Insufficient documentation

## 2022-11-22 DIAGNOSIS — R293 Abnormal posture: Secondary | ICD-10-CM | POA: Insufficient documentation

## 2022-11-22 DIAGNOSIS — M6281 Muscle weakness (generalized): Secondary | ICD-10-CM | POA: Insufficient documentation

## 2022-11-22 DIAGNOSIS — R2689 Other abnormalities of gait and mobility: Secondary | ICD-10-CM | POA: Insufficient documentation

## 2022-11-22 DIAGNOSIS — R296 Repeated falls: Secondary | ICD-10-CM | POA: Insufficient documentation

## 2022-11-22 DIAGNOSIS — R262 Difficulty in walking, not elsewhere classified: Secondary | ICD-10-CM | POA: Insufficient documentation

## 2022-11-22 DIAGNOSIS — R2681 Unsteadiness on feet: Secondary | ICD-10-CM | POA: Insufficient documentation

## 2022-11-24 ENCOUNTER — Encounter: Payer: Self-pay | Admitting: Physical Therapy

## 2022-11-24 ENCOUNTER — Encounter: Payer: Self-pay | Admitting: Family Medicine

## 2022-11-24 ENCOUNTER — Ambulatory Visit: Payer: 59 | Admitting: Physical Therapy

## 2022-11-24 DIAGNOSIS — R296 Repeated falls: Secondary | ICD-10-CM | POA: Diagnosis present

## 2022-11-24 DIAGNOSIS — R293 Abnormal posture: Secondary | ICD-10-CM | POA: Diagnosis present

## 2022-11-24 DIAGNOSIS — R2681 Unsteadiness on feet: Secondary | ICD-10-CM

## 2022-11-24 DIAGNOSIS — M6281 Muscle weakness (generalized): Secondary | ICD-10-CM | POA: Diagnosis present

## 2022-11-24 DIAGNOSIS — R262 Difficulty in walking, not elsewhere classified: Secondary | ICD-10-CM

## 2022-11-24 DIAGNOSIS — R2689 Other abnormalities of gait and mobility: Secondary | ICD-10-CM

## 2022-11-24 DIAGNOSIS — R269 Unspecified abnormalities of gait and mobility: Secondary | ICD-10-CM | POA: Diagnosis present

## 2022-11-24 NOTE — Therapy (Signed)
OUTPATIENT PHYSICAL THERAPY NEURO TREATMENT  Patient Name: Kirk Bennett. MRN: [id number] DOB:March 15, 1951, 72 y.o., male Today's Date: 11/24/2022   PCP: Ria Bush, MD  REFERRING PROVIDER: Jeanette Caprice,  MD    END OF SESSION:  PT End of Session - 11/24/22 1350     Visit Number 14    Number of Visits 24    Date for PT Re-Evaluation 12/28/22    Progress Note Due on Visit 20    PT Start Time 1350    PT Stop Time 1430    PT Time Calculation (min) 40 min    Equipment Utilized During Treatment Gait belt    Activity Tolerance Patient tolerated treatment well;Patient limited by fatigue    Behavior During Therapy [facility] for tasks assessed/performed                Past Medical History:  Diagnosis Date   Cataracts, bilateral 02/2011   and suspected glaucoma, to return for f/u, no diabetic retinopathy   CKD stage 3 due to type 2 diabetes mellitus 2015   normal renal US, self referred to Dr Holley Raring   Complex partial seizures 1990   from surgery for R temporal arachnoid cyst s/p drainage, possible continued sz so changed to lamotrigine (Dr. 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 Gabriel Carina endo --> 02/2016 decided to return to PCP for DM care   Past Surgical History:  Procedure Laterality Date   CARDIAC CATHETERIZATION  02/19/2009   No blockages (Dr. 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   Irregular heart beat 07/19/2021   BPH (benign prostatic hyperplasia) 10/03/2020   Pain due to onychomycosis of toenails of both feet 06/18/2020   CKD stage 3 due to type 2 diabetes mellitus 10/23/2019   Type 2 diabetes mellitus with diabetic neuropathy, unspecified 10/21/2019   Impaired gait and mobility 10/14/2019   Postural dizziness with presyncope 10/13/2019   Ventricular premature complexes 10/13/2019   Urge urinary incontinence 09/26/2019   Depressed mood 09/26/2019   Advanced care planning/counseling discussion 09/22/2018   Weakness of both lower extremities 09/22/2018   History of hepatitis 01/05/2018   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   NAFLD (nonalcoholic fatty liver disease) 09/30/2017   Obesity, Class I, BMI 30.0-34.9 (see actual BMI) 09/18/2017   Polyarthralgia 02/15/2017   OSA (obstructive sleep apnea) 02/15/2017   Partial epilepsy with impairment of consciousness  01/29/2016   Low vitamin B12 level 05/27/2015   Elevated PSA 05/27/2015   Vitamin D deficiency 03/02/2015   Other long term (current) drug therapy 03/12/2012   Encounter for general adult medical examination with abnormal findings 04/26/2011   TOTAL KNEE REPLACEMENT, LEFT, HX OF 04/07/2010   Type 2 diabetes mellitus with diabetic nephropathy 03/30/2010   Hyperlipidemia associated with type 2 diabetes mellitus 03/30/2010   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 03/30/2010   Essential hypertension, benign 03/30/2010    ONSET DATE: 05/2021  REFERRING DIAG: G20.C (ICD-10-CM) - Parkinsonism,  unspecified   THERAPY DIAG:  Abnormality of gait and mobility  Difficulty in walking, not elsewhere classified  Muscle weakness (generalized)  Unsteadiness on feet  Other abnormalities of gait and mobility  Repeated falls  Abnormal posture  Rationale for Evaluation and Treatment: Rehabilitation  SUBJECTIVE:                                                                                                                                                                                             SUBJECTIVE STATEMENT:   Pt reports that he is doing well. Missed last PT treatment. Wife reports that he had dizzy spell prior to leaving the house, and was unable to get to car. Pt allowed to rest at home and perked up after having meal according to wife.     Pt accompanied by: self  PERTINENT HISTORY: Diabetic neuropathy, atypical PD which MD thinks could be PSP, pt reports diagnosis is parkinson's disease.    PAIN:  Are you having pain? No  PRECAUTIONS: Fall  WEIGHT BEARING RESTRICTIONS: No  FALLS: Has patient fallen in last 6 months? Yes. Number of falls 1 fall when he slippe dand fell getting out of the shower.   LIVING ENVIRONMENT: Lives with: lives with their family and lives with their spouse Lives in: House/apartment Stairs: Yes: Internal: 66, 2 sets steps; on right going up and External: 5 steps; on right going up Has following equipment at home: Single point cane, Walker - 2 wheeled, shower chair, and elevated commode  PLOF: Independent with basic ADLs, Independent with household mobility with device, Independent with community mobility with device, and Needs assistance with homemaking  PATIENT GOALS: Lower and upper body strength as well as balance   OBJECTIVE:   DIAGNOSTIC FINDINGS: No significant findings   COGNITION: Overall cognitive status:  A little flat but otherwise WNL    SENSATION: Not tested  COORDINATION: Not tested   EDEMA:  None   MUSCLE  TONE: Not tested, if in supine may be good to assess if pt has rigidity  POSTURE: rounded shoulders, forward head, and increased thoracic kyphosis  LOWER EXTREMITY ROM:   WNL in seated, in standing pt unable to obtain full erect posture.    LOWER EXTREMITY MMT:    MMT Right Eval Left Eval  Hip flexion 4 4-  Hip extension    Hip abduction 4+ 4+  Hip adduction 4 4  Hip internal rotation    Hip external rotation    Knee flexion 4+ 4+  Knee extension 4+ 4+  Ankle dorsiflexion 4+ 4+  Ankle plantarflexion 5 5  Ankle inversion    Ankle eversion    (Blank rows = not tested) All tested in seated position   BED MOBILITY:  Not tested at eval   TRANSFERS: Assistive device utilized:  UE and cane    Sit to stand: CGA Stand to sit: CGA Chair to chair: CGA Floor:  Not indicated for testing     CURB: Not tested   STAIRS: Not tested at eval, to look at in future assessments    GAIT: Gait pattern: step through pattern, decreased stride length, knee flexed in stance- Right, knee flexed in stance- Left, shuffling, and trunk flexed Distance walked: 30 feet  Assistive device utilized: Single point cane Level of assistance: CGA Comments: uses walls and other ibjects in clinic for steadying   FUNCTIONAL TESTS:  5 times sit to stand:  2/14:73.7 sec with significant UE assist and unable to stand in full erect posture. 3/19: 36.76 with UEpushing from arm rests  Timed up and go (TUG): 2/14: 42.45 sec with SPC and with UE assist on STS transfer. 3/19 55.2 sec with RW and UE assist for chair transfer. 10 meter walk test: 2/14: 57.80 with bariatric walker. 3/19: 44.31sec  Berg Balance Scale: 33/56   PATIENT SURVEYS:  FOTO 2/14: 40. 3/19: 50   TODAY'S TREATMENT: DATE: 11/24/22 TA  Pt performed stand pivot transfer to nustep with min assist to power into standing from transport chair. Nustep reciprocal movement training x 5 min, level 2, with moderate cues throughout for improved ROM  and amplitude of movement.   Gait training with HDRW x 118ft with cues for posture and increased step length Bil. 2:44 min with 2.5# ankle weights. 2:10 min without ankle weights. (Average gait speed on first bout 0.44m/s, and second bout. 0.64m/s)  Seated therex with 4# ankle weight :  LAQ x 10  Reciprocal march x 12  Hip abduction x 12 GTB LE push down x 12 GTB Isometric hip adduction x 12 with 3 sec hold.  Cues for decreased speed and hold at end range , as well as large amplitude concentric movement.  Supervision assist throughout session following first transfer for all gait and transfers. Noted improved step length on this day compared to prior PT treatment    PATIENT EDUCATION: Education details: POC  Person educated: Patient and Spouse Education method: Explanation Education comprehension: verbalized understanding  HOME EXERCISE PROGRAM: Access Code: [email or web address] URL: [email or web address] Date: 10/25/2022 Prepared by: Barrie Folk  Exercises - Seated Hip Abduction with Pelvic Floor Contraction and Resistance Loop  - 1 x daily - 7 x weekly - 3 sets - 10 reps - Sitting Knee Extension with Resistance  - 1 x daily - 7 x weekly - 3 sets - 10 reps - Standing Hip Flexor Stretch  - 1 x daily - 7 x weekly - 3 sets - 10 reps  GOALS: Goals reviewed with patient? Yes  SHORT TERM GOALS: Target date: 11/02/2022  Patient will  be independent in home exercise program to improve strength/mobility for better functional independence with ADLs. Baseline: provided on 3/5 Goal status: progressing    LONG TERM GOALS: Target date: 12/28/2022  1.  Patient (> 59 years old) will complete five times sit to stand test in < 30 seconds indicating an increased LE strength and improved balance. Baseline: 2/14: 73.7 sec with significant UE assistance and not able to obtain full erect posture in standing. 3/19: 36.76 with UE to push from arm rest Goal status: progressing   2.  Patient  will increase FOTO score to equal to or greater than  47   to demonstrate statistically significant improvement in mobility and quality of life.  Baseline:2/14: 40 3/19: 50   Goal status: MET    3.  Patient will increase Berg Balance score by > 6 points to demonstrate decreased fall risk during functional activities. Baseline: 33/56  Goal status: progressing    4.  Patient will reduce timed up and go to <25 seconds with LRAD to reduce fall risk and demonstrate improved transfer/gait ability. Baseline: 2/14: 42.45 with SPC and UE assist with stand. 3/19: 55.2 sec with HDRW Goal status: progressing   5.  Patient will increase 10 meter walk test to >1.21m/s as to improve gait speed for better community ambulation and to reduce fall risk. Baseline: 57.80 sec with bariatric walker 3/19: 0.22 m/s (44.31 sec) Goal status: INITIAL    ASSESSMENT:  CLINICAL IMPRESSION:  Pt put forth good effort throughout session on this day. PT treatment focused on functional gait training with RW. Noted to have improved step length and tolerance to standing on this day compared to prior PT treatments.  Pt will continue to benefit from skilled physical therapy intervention to address impairments, improve QOL, and attain therapy goals.    OBJECTIVE IMPAIRMENTS: Abnormal gait, decreased activity tolerance, decreased balance, decreased endurance, decreased knowledge of use of DME, decreased mobility, difficulty walking, decreased strength, decreased safety awareness, impaired perceived functional ability, and improper body mechanics.   ACTIVITY LIMITATIONS: bending, sitting, standing, squatting, stairs, transfers, bed mobility, bathing, toileting, and locomotion level  PARTICIPATION LIMITATIONS:  Pt reports no participation restrictions when prompted   PERSONAL FACTORS: 3+ comorbidities: HTN, depressed mood ( chart), T2DM  are also affecting patient's functional outcome.   REHAB POTENTIAL: Fair secondary to  current functional level and progressive aspect of diagnosis   CLINICAL DECISION MAKING: Evolving/moderate complexity  EVALUATION COMPLEXITY: Moderate  PLAN:  PT FREQUENCY: 2x/week  PT DURATION: 12 weeks  PLANNED INTERVENTIONS: Therapeutic exercises, Therapeutic activity, Neuromuscular re-education, Balance training, Gait training, Patient/Family education, Self Care, Joint mobilization, Stair training, and Manual therapy   PLAN FOR NEXT SESSION:   BLE strengthening. Continue endurance and gait training.    Lorie Phenix PT ,DPT Physical Therapist- [facility]   3:10 PM 11/24/22

## 2022-11-29 ENCOUNTER — Ambulatory Visit: Payer: 59 | Admitting: Physical Therapy

## 2022-11-29 DIAGNOSIS — R262 Difficulty in walking, not elsewhere classified: Secondary | ICD-10-CM

## 2022-11-29 DIAGNOSIS — R2689 Other abnormalities of gait and mobility: Secondary | ICD-10-CM

## 2022-11-29 DIAGNOSIS — R293 Abnormal posture: Secondary | ICD-10-CM

## 2022-11-29 DIAGNOSIS — R296 Repeated falls: Secondary | ICD-10-CM

## 2022-11-29 DIAGNOSIS — M6281 Muscle weakness (generalized): Secondary | ICD-10-CM

## 2022-11-29 DIAGNOSIS — R2681 Unsteadiness on feet: Secondary | ICD-10-CM

## 2022-11-29 DIAGNOSIS — R269 Unspecified abnormalities of gait and mobility: Secondary | ICD-10-CM | POA: Diagnosis not present

## 2022-11-29 NOTE — Therapy (Signed)
OUTPATIENT PHYSICAL THERAPY NEURO TREATMENT  Patient Name: Kirk Bennett. MRN: [id number] DOB:March 15, 1951, 72 y.o., male Today's Date: 11/24/2022   PCP: Ria Bush, MD  REFERRING PROVIDER: Jeanette Caprice,  MD    END OF SESSION:  PT End of Session - 11/24/22 1350     Visit Number 14    Number of Visits 24    Date for PT Re-Evaluation 12/28/22    Progress Note Due on Visit 20    PT Start Time 1350    PT Stop Time 1430    PT Time Calculation (min) 40 min    Equipment Utilized During Treatment Gait belt    Activity Tolerance Patient tolerated treatment well;Patient limited by fatigue    Behavior During Therapy [facility] for tasks assessed/performed                Past Medical History:  Diagnosis Date   Cataracts, bilateral 02/2011   and suspected glaucoma, to return for f/u, no diabetic retinopathy   CKD stage 3 due to type 2 diabetes mellitus 2015   normal renal US, self referred to Dr Holley Raring   Complex partial seizures 1990   from surgery for R temporal arachnoid cyst s/p drainage, possible continued sz so changed to lamotrigine (Dr. 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 Gabriel Carina endo --> 02/2016 decided to return to PCP for DM care   Past Surgical History:  Procedure Laterality Date   CARDIAC CATHETERIZATION  02/19/2009   No blockages (Dr. 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   Irregular heart beat 07/19/2021   BPH (benign prostatic hyperplasia) 10/03/2020   Pain due to onychomycosis of toenails of both feet 06/18/2020   CKD stage 3 due to type 2 diabetes mellitus 10/23/2019   Type 2 diabetes mellitus with diabetic neuropathy, unspecified 10/21/2019   Impaired gait and mobility 10/14/2019   Postural dizziness with presyncope 10/13/2019   Ventricular premature complexes 10/13/2019   Urge urinary incontinence 09/26/2019   Depressed mood 09/26/2019   Advanced care planning/counseling discussion 09/22/2018   Weakness of both lower extremities 09/22/2018   History of hepatitis 01/05/2018   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   NAFLD (nonalcoholic fatty liver disease) 09/30/2017   Obesity, Class I, BMI 30.0-34.9 (see actual BMI) 09/18/2017   Polyarthralgia 02/15/2017   OSA (obstructive sleep apnea) 02/15/2017   Partial epilepsy with impairment of consciousness  01/29/2016   Low vitamin B12 level 05/27/2015   Elevated PSA 05/27/2015   Vitamin D deficiency 03/02/2015   Other long term (current) drug therapy 03/12/2012   Encounter for general adult medical examination with abnormal findings 04/26/2011   TOTAL KNEE REPLACEMENT, LEFT, HX OF 04/07/2010   Type 2 diabetes mellitus with diabetic nephropathy 03/30/2010   Hyperlipidemia associated with type 2 diabetes mellitus 03/30/2010   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 03/30/2010   Essential hypertension, benign 03/30/2010    ONSET DATE: 05/2021  REFERRING DIAG: G20.C (ICD-10-CM) - Parkinsonism,  unspecified   THERAPY DIAG:  Abnormality of gait and mobility  Difficulty in walking, not elsewhere classified  Muscle weakness (generalized)  Unsteadiness on feet  Other abnormalities of gait and mobility  Repeated falls  Abnormal posture  Rationale for Evaluation and Treatment: Rehabilitation  SUBJECTIVE:                                                                                                                                                                                             SUBJECTIVE STATEMENT:   Pt reports that he is doing well. No new updates from pt.     Pt accompanied by: self  PERTINENT HISTORY: Diabetic neuropathy, atypical PD which MD thinks could be PSP, pt reports diagnosis is parkinson's disease.    PAIN:  Are you having pain? No  PRECAUTIONS: Fall  WEIGHT BEARING RESTRICTIONS: No  FALLS: Has patient fallen in last 6 months? Yes. Number of falls 1 fall when he slippe dand fell getting out of the shower.   LIVING ENVIRONMENT: Lives with: lives with their family and lives with their spouse Lives in: House/apartment Stairs: Yes: Internal: 13, 2 sets steps; on right going up and External: 5 steps; on right going up Has following equipment at home: Single point cane, Walker - 2 wheeled, shower chair, and elevated commode  PLOF: Independent with basic ADLs, Independent with household mobility with device, Independent with community mobility with device, and Needs assistance with homemaking  PATIENT GOALS: Lower and upper body strength as well as balance   OBJECTIVE:   DIAGNOSTIC FINDINGS: No significant findings   COGNITION: Overall cognitive status:  A little flat but otherwise WNL    SENSATION: Not tested  COORDINATION: Not tested   EDEMA:  None   MUSCLE TONE: Not tested, if in supine may be good to assess if pt has rigidity    POSTURE: rounded shoulders, forward head, and increased thoracic kyphosis  LOWER EXTREMITY ROM:    WNL in seated, in standing pt unable to obtain full erect posture.  LOWER EXTREMITY MMT:    MMT Right Eval Left Eval  Hip flexion 4 4-  Hip extension    Hip abduction 4+ 4+  Hip adduction 4 4  Hip internal rotation    Hip external rotation    Knee flexion 4+ 4+  Knee extension 4+ 4+  Ankle dorsiflexion 4+ 4+  Ankle plantarflexion 5 5  Ankle inversion    Ankle eversion    (Blank rows = not tested) All tested in seated position   BED MOBILITY:  Not tested at eval   TRANSFERS: Assistive device utilized:  UE and cane    Sit to stand: CGA Stand to sit: CGA Chair to chair: CGA Floor:  Not indicated for testing     CURB: Not tested   STAIRS: Not tested at eval, to look at in future assessments    GAIT: Gait pattern: step through pattern, decreased stride length, knee flexed in stance- Right, knee flexed in stance- Left, shuffling, and trunk flexed Distance walked: 30 feet  Assistive device utilized: Single point cane Level of assistance: CGA Comments: uses walls and other ibjects in clinic for steadying   FUNCTIONAL TESTS:  5 times sit to stand:  2/14:73.7 sec with significant UE assist and unable to stand in full erect posture. 3/19: 36.76 with UEpushing from arm rests  Timed up and go (TUG): 2/14: 42.45 sec with SPC and with UE assist on STS transfer. 3/19 55.2 sec with RW and UE assist for chair transfer. 10 meter walk test: 2/14: 57.80 with bariatric walker. 3/19: 44.31sec  Berg Balance Scale: 33/56   PATIENT SURVEYS:  FOTO 2/14: 40. 3/19: 50   TODAY'S TREATMENT: DATE: 11/24/22 TA  Sit<>stand transfers with RW throughout session with supervision assist for safety and min cues for AD management and posture.   Gait training with HDRW x 17950ft 5240ft and 5235ft with cues for posture and increased step length Bil, but supervision assist only provided by PT. Performed with 3# ankle weights to force improved motor recruitment with limb advancement. Pt noted to have  decreased gait speed and reduced safety and control of RW in turns on this day.   Sit<>stand from Mat table with no UE support x 6. Pt then performed sit<>stand with UE raise overhead with min cues for erect posture and to prevent resting BLE on edge of bed.   Ambulator transfer to as listed above Nustep reciprocal movement training x 6 min + 3 min, level 2, with moderate cues throughout for improved ROM and amplitude of movement and increased trunk rotation   PATIENT EDUCATION: Education details: POC  Person educated: Patient and Spouse Education method: Explanation Education comprehension: verbalized understanding  HOME EXERCISE PROGRAM: Access Code: [email or web address] URL: [email or web address] Date: 10/25/2022 Prepared by: Grier RocherAustin Kymberly Blomberg  Exercises - Seated Hip Abduction with Pelvic Floor Contraction and Resistance Loop  - 1 x daily - 7 x weekly - 3 sets - 10 reps - Sitting Knee Extension with Resistance  - 1 x daily - 7 x weekly - 3 sets - 10 reps - Standing Hip Flexor Stretch  - 1 x daily - 7 x weekly - 3 sets - 10 reps  GOALS: Goals reviewed with patient? Yes  SHORT TERM GOALS: Target date: 11/02/2022  Patient will be independent in home exercise program to improve strength/mobility for better functional independence with ADLs. Baseline: provided on 3/5 Goal status: progressing    LONG TERM GOALS: Target date: 12/28/2022  1.  Patient (> 70 years old) will  complete five times sit to stand test in < 30 seconds indicating an increased LE strength and improved balance. Baseline: 2/14: 73.7 sec with significant UE assistance and not able to obtain full erect posture in standing. 3/19: 36.76 with UE to push from arm rest Goal status: progressing   2.  Patient will increase FOTO score to equal to or greater than  47   to demonstrate statistically significant improvement in mobility and quality of life.  Baseline:2/14: 40 3/19: 50   Goal status: MET    3.  Patient will  increase Berg Balance score by > 6 points to demonstrate decreased fall risk during functional activities. Baseline: 33/56  Goal status: progressing    4.  Patient will reduce timed up and go to <25 seconds with LRAD to reduce fall risk and demonstrate improved transfer/gait ability. Baseline: 2/14: 42.45 with SPC and UE assist with stand. 3/19: 55.2 sec with HDRW Goal status: progressing   5.  Patient will increase 10 meter walk test to >1.46m/s as to improve gait speed for better community ambulation and to reduce fall risk. Baseline: 57.80 sec with bariatric walker 3/19: 0.22 m/s (44.31 sec) Goal status: progressing     ASSESSMENT:  CLINICAL IMPRESSION:  Pt put forth good effort throughout session on this day. PT treatment focused on functional gait training with RW. Reduce gait speed and posture on this session compared to prior session. Pt was able to demonstrate improved trunkal rotation and sustained SPM on nustep on this day >45 with minimal instruction. Pt will continue to benefit from skilled physical therapy intervention to address impairments, improve QOL, and attain therapy goals.    OBJECTIVE IMPAIRMENTS: Abnormal gait, decreased activity tolerance, decreased balance, decreased endurance, decreased knowledge of use of DME, decreased mobility, difficulty walking, decreased strength, decreased safety awareness, impaired perceived functional ability, and improper body mechanics.   ACTIVITY LIMITATIONS: bending, sitting, standing, squatting, stairs, transfers, bed mobility, bathing, toileting, and locomotion level  PARTICIPATION LIMITATIONS:  Pt reports no participation restrictions when prompted   PERSONAL FACTORS: 3+ comorbidities: HTN, depressed mood ( chart), T2DM  are also affecting patient's functional outcome.   REHAB POTENTIAL: Fair secondary to current functional level and progressive aspect of diagnosis   CLINICAL DECISION MAKING: Evolving/moderate  complexity  EVALUATION COMPLEXITY: Moderate  PLAN:  PT FREQUENCY: 2x/week  PT DURATION: 12 weeks  PLANNED INTERVENTIONS: Therapeutic exercises, Therapeutic activity, Neuromuscular re-education, Balance training, Gait training, Patient/Family education, Self Care, Joint mobilization, Stair training, and Manual therapy   PLAN FOR NEXT SESSION:   BLE strengthening. Continue endurance and gait training.    Golden Pop PT ,DPT Physical Therapist- [place]  [facility]   3:10 PM 11/24/22

## 2022-12-01 ENCOUNTER — Ambulatory Visit: Payer: 59 | Admitting: Physical Therapy

## 2022-12-01 DIAGNOSIS — M6281 Muscle weakness (generalized): Secondary | ICD-10-CM

## 2022-12-01 DIAGNOSIS — R269 Unspecified abnormalities of gait and mobility: Secondary | ICD-10-CM

## 2022-12-01 DIAGNOSIS — R2681 Unsteadiness on feet: Secondary | ICD-10-CM

## 2022-12-01 DIAGNOSIS — R262 Difficulty in walking, not elsewhere classified: Secondary | ICD-10-CM

## 2022-12-01 DIAGNOSIS — R293 Abnormal posture: Secondary | ICD-10-CM

## 2022-12-01 DIAGNOSIS — R296 Repeated falls: Secondary | ICD-10-CM

## 2022-12-01 DIAGNOSIS — R2689 Other abnormalities of gait and mobility: Secondary | ICD-10-CM

## 2022-12-01 NOTE — Therapy (Signed)
OUTPATIENT PHYSICAL THERAPY NEURO TREATMENT  Patient Name: Kirk Bennett A Sauerwein Jr. MRN: [id number] DOB:20-Jul-1951, 72 y.o., male Today's Date: 12/01/2022   PCP: Eustaquio BoydenGutierrez, Javier, MD  REFERRING PROVIDER: Demetrio LappingMoore, Kathryn P,  MD    END OF SESSION:  PT End of Session - 12/01/22 1404     Visit Number 16    Number of Visits 24    Date for PT Re-Evaluation 12/28/22    Progress Note Due on Visit 20    PT Start Time 1356    PT Stop Time 1430    PT Time Calculation (min) 34 min    Equipment Utilized During Treatment Gait belt    Activity Tolerance Patient tolerated treatment well;Patient limited by fatigue    Behavior During Therapy [facility] for tasks assessed/performed                Past Medical History:  Diagnosis Date   Cataracts, bilateral 02/2011   and suspected glaucoma, to return for f/u, no diabetic retinopathy   CKD stage 3 due to type 2 diabetes mellitus 2015   normal renal US, self referred to Dr Cherylann RatelLateef   Complex partial seizures 1990   from surgery for R temporal arachnoid cyst s/p drainage, possible continued sz so changed 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 Tedd SiasSolum endo --> 02/2016 decided to return to PCP for DM care   Past Surgical History:  Procedure Laterality Date   CARDIAC CATHETERIZATION  02/19/2009   [place] blockages [place]   COLONOSCOPY  09/16/2005   hyperplastic polyps, rpt due 10 yrs    COLONOSCOPY WITH PROPOFOL N/A 12/11/2015   mult polyps, few TA, rpt 6361yrs ([place])   COLONOSCOPY WITH PROPOFOL N/A 09/02/2021   TAs, HPs, int hem, distal ileal polyposis - benign,  rpt yrs([place]so, Thomas HoffSteven Michael, DO)   corrective surgery amblyopia  08/23/1955   Cystectomy or meningioma removal brain  08/22/1988   (unclear)   Left knee surgery  08/22/1969   Torn ACL   REPLACEMENT TOTAL KNEE  11/20/2009   Left ([facility] Dr. 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   Irregular heart beat 07/19/2021   BPH (benign prostatic hyperplasia) 10/03/2020   Pain due to onychomycosis of toenails of both feet 06/18/2020   CKD stage 3 due to type 2 diabetes mellitus 10/23/2019   Type 2 diabetes mellitus with diabetic neuropathy, unspecified 10/21/2019   Impaired gait and mobility 10/14/2019   Postural dizziness with presyncope 10/13/2019   Ventricular premature complexes 10/13/2019   Urge urinary incontinence 09/26/2019   Depressed mood 09/26/2019   Advanced care planning/counseling discussion 09/22/2018   Weakness of both lower extremities 09/22/2018   History of hepatitis 01/05/2018   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   NAFLD (nonalcoholic fatty liver disease) 40/98/119102/04/2018   Obesity, Class I, BMI 30.0-34.9 (see actual BMI) 09/18/2017   Polyarthralgia 02/15/2017   OSA (obstructive sleep apnea) 02/15/2017   Partial epilepsy with impairment of consciousness  01/29/2016   Low vitamin B12 level 05/27/2015   Elevated PSA 05/27/2015   Vitamin D deficiency 03/02/2015   Other long term (current) drug therapy 03/12/2012   Encounter for general adult medical examination with abnormal findings 04/26/2011   TOTAL KNEE REPLACEMENT, LEFT, HX OF 04/07/2010   Type 2 diabetes mellitus with diabetic nephropathy 03/30/2010   Hyperlipidemia associated with type 2 diabetes mellitus 03/30/2010   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 03/30/2010   Essential hypertension, benign 03/30/2010    ONSET DATE: 05/2021  REFERRING DIAG: G20.C (ICD-10-CM) - Parkinsonism,  unspecified   THERAPY DIAG:  Abnormality of gait and mobility  Difficulty in walking, not elsewhere classified  Unsteadiness on feet  Other abnormalities of gait and mobility  Repeated falls  Abnormal posture  Muscle weakness (generalized)  Rationale for Evaluation and Treatment: Rehabilitation  SUBJECTIVE:                                                                                                                                                                                             SUBJECTIVE STATEMENT:   Pt reports that he is doing well. Wife states that he is still not moving as much as she would like at home, but getting to and from the kitchen okay.     Pt accompanied by: self and significant other  PERTINENT HISTORY: Diabetic neuropathy, atypical PD which MD thinks could be PSP, pt reports diagnosis is parkinson's disease.    PAIN:  Are you having pain? No  PRECAUTIONS: Fall  WEIGHT BEARING RESTRICTIONS: No  FALLS: Has patient fallen in last 6 months? Yes. Number of falls 1 fall when he slippe dand fell getting out of the shower.   LIVING ENVIRONMENT: Lives with: lives with their family and lives with their spouse Lives in: House/apartment Stairs: Yes: Internal: 13, 2 sets steps; on right going up and External: 5 steps; on right going up Has following equipment at home: Single point cane, Walker - 2 wheeled, shower chair, and elevated commode  PLOF: Independent with basic ADLs, Independent with household mobility with device, Independent with community mobility with device, and Needs assistance with homemaking  PATIENT GOALS: Lower and upper body strength as well as balance   OBJECTIVE:   DIAGNOSTIC FINDINGS: No significant findings   COGNITION: Overall cognitive status:  A little flat but otherwise WNL    SENSATION: Not tested  COORDINATION: Not tested   EDEMA:  None   MUSCLE TONE: Not tested, if in supine may be good to assess if pt  has rigidity    POSTURE: rounded shoulders, forward head, and increased thoracic  kyphosis  LOWER EXTREMITY ROM:   WNL in seated, in standing pt unable to obtain full erect posture.    LOWER EXTREMITY MMT:    MMT Right Eval Left Eval  Hip flexion 4 4-  Hip extension    Hip abduction 4+ 4+  Hip adduction 4 4  Hip internal rotation    Hip external rotation    Knee flexion 4+ 4+  Knee extension 4+ 4+  Ankle dorsiflexion 4+ 4+  Ankle plantarflexion 5 5  Ankle inversion    Ankle eversion    (Blank rows = not tested) All tested in seated position   BED MOBILITY:  Not tested at eval   TRANSFERS: Assistive device utilized:  UE and cane    Sit to stand: CGA Stand to sit: CGA Chair to chair: CGA Floor:  Not indicated for testing     CURB: Not tested   STAIRS: Not tested at eval, to look at in future assessments    GAIT: Gait pattern: step through pattern, decreased stride length, knee flexed in stance- Right, knee flexed in stance- Left, shuffling, and trunk flexed Distance walked: 30 feet  Assistive device utilized: Single point cane Level of assistance: CGA Comments: uses walls and other ibjects in clinic for steadying   FUNCTIONAL TESTS:  5 times sit to stand:  2/14:73.7 sec with significant UE assist and unable to stand in full erect posture. 3/19: 36.76 with UEpushing from arm rests  Timed up and go (TUG): 2/14: 42.45 sec with SPC and with UE assist on STS transfer. 3/19 55.2 sec with RW and UE assist for chair transfer. 10 meter walk test: 2/14: 57.80 with bariatric walker. 3/19: 44.31sec  Berg Balance Scale: 33/56   PATIENT SURVEYS:  FOTO 2/14: 40. 3/19: 50   TODAY'S TREATMENT: DATE: 12/01/22 TA  Nustep BLE/BUE AAROM reciprocal movement training x 3.5 min .   Sit<>stand 2 x 5 with UE pushing from thighs on first bout and pushing from bed or thigh on second bout. Instruction for weight shift and full hip extension t oachieve erect posture   Seated  therex  hip abduction x 10 GTB Hip flexion with 2 sec hold x 10 BLE, GTB  LAQ GTB x 10 BLE with 2 sec hold  Press down x 10 BLE GTB   Gait with RW x `133ft performed x 2, 4:33 on firest bout and 4:17 on second bout. Cues for posture, step length and AD management. Noted to have festination on this day through doorways and at corners, cues for step advancement to break festination.     PATIENT EDUCATION: Education details: POC  Person educated: Patient and Spouse Education method: Explanation Education comprehension: verbalized understanding  HOME EXERCISE PROGRAM: Access Code: [email or web address] URL: [email or web address] Date: 10/25/2022 Prepared by: Grier Rocher  Exercises - Seated Hip Abduction with Pelvic Floor Contraction and Resistance Loop  - 1 x daily - 7 x weekly - 3 sets - 10 reps - Sitting Knee Extension with Resistance  - 1 x daily - 7 x weekly - 3 sets - 10 reps - Standing Hip Flexor Stretch  - 1 x daily - 7 x weekly - 3 sets - 10 reps  GOALS: Goals reviewed with patient? Yes  SHORT TERM GOALS: Target date: 11/02/2022  Patient will be independent in home exercise program to improve strength/mobility for better functional independence with ADLs. Baseline: provided on 3/5 Goal status: progressing    LONG TERM GOALS: Target date: 12/28/2022  1.  Patient (>  34 years old) will complete five times sit to stand test in < 30 seconds indicating an increased LE strength and improved balance. Baseline: 2/14: 73.7 sec with significant UE assistance and not able to obtain full erect posture in standing. 3/19: 36.76 with UE to push from arm rest Goal status: progressing   2.  Patient will increase FOTO score to equal to or greater than  47   to demonstrate statistically significant improvement in mobility and quality of life.  Baseline:2/14: 40 3/19: 50   Goal status: MET    3.  Patient will increase Berg Balance score by > 6 points to demonstrate decreased fall risk  during functional activities. Baseline: 33/56  Goal status: progressing    4.  Patient will reduce timed up and go to <25 seconds with LRAD to reduce fall risk and demonstrate improved transfer/gait ability. Baseline: 2/14: 42.45 with SPC and UE assist with stand. 3/19: 55.2 sec with HDRW Goal status: progressing   5.  Patient will increase 10 meter walk test to >1.60m/s as to improve gait speed for better community ambulation and to reduce fall risk. Baseline: 57.80 sec with bariatric walker 3/19: 0.22 m/s (44.31 sec) Goal status: progressing     ASSESSMENT:  CLINICAL IMPRESSION:  Pt put forth good effort throughout session on this day. Wife present for session. Encouraged by PT to continue to perform HEP when not at therapy. Pt noted to have increased festination on this day and decreased gait speed compared to prior session. Pt will continue to benefit from skilled physical therapy intervention to address impairments, improve QOL, and attain therapy goals.    OBJECTIVE IMPAIRMENTS: Abnormal gait, decreased activity tolerance, decreased balance, decreased endurance, decreased knowledge of use of DME, decreased mobility, difficulty walking, decreased strength, decreased safety awareness, impaired perceived functional ability, and improper body mechanics.   ACTIVITY LIMITATIONS: bending, sitting, standing, squatting, stairs, transfers, bed mobility, bathing, toileting, and locomotion level  PARTICIPATION LIMITATIONS:  Pt reports no participation restrictions when prompted   PERSONAL FACTORS: 3+ comorbidities: HTN, depressed mood ( chart), T2DM  are also affecting patient's functional outcome.   REHAB POTENTIAL: Fair secondary to current functional level and progressive aspect of diagnosis   CLINICAL DECISION MAKING: Evolving/moderate complexity  EVALUATION COMPLEXITY: Moderate  PLAN:  PT FREQUENCY: 2x/week  PT DURATION: 12 weeks  PLANNED INTERVENTIONS: Therapeutic exercises,  Therapeutic activity, Neuromuscular re-education, Balance training, Gait training, Patient/Family education, Self Care, Joint mobilization, Stair training, and Manual therapy   PLAN FOR NEXT SESSION:   BLE strengthening. Continue endurance and gait training for PD     Golden Pop PT ,DPT Physical Therapist- [facility]  [facility]   5:18 PM 12/01/22

## 2022-12-06 ENCOUNTER — Ambulatory Visit: Payer: 59 | Admitting: Physical Therapy

## 2022-12-06 DIAGNOSIS — R269 Unspecified abnormalities of gait and mobility: Secondary | ICD-10-CM | POA: Diagnosis not present

## 2022-12-06 DIAGNOSIS — R2689 Other abnormalities of gait and mobility: Secondary | ICD-10-CM

## 2022-12-06 DIAGNOSIS — R296 Repeated falls: Secondary | ICD-10-CM

## 2022-12-06 DIAGNOSIS — R293 Abnormal posture: Secondary | ICD-10-CM

## 2022-12-06 DIAGNOSIS — R262 Difficulty in walking, not elsewhere classified: Secondary | ICD-10-CM

## 2022-12-06 DIAGNOSIS — R2681 Unsteadiness on feet: Secondary | ICD-10-CM

## 2022-12-06 DIAGNOSIS — M6281 Muscle weakness (generalized): Secondary | ICD-10-CM

## 2022-12-06 NOTE — Therapy (Signed)
OUTPATIENT PHYSICAL THERAPY NEURO TREATMENT  Patient Name: Kirk Bennett. MRN: [id number] DOB:[birth year], 72 y.o., male Today's Date: 12/06/2022   PCP: Eustaquio Boyden, MD  REFERRING PROVIDER: Demetrio Lapping,  MD    END OF SESSION:       Past Medical History:  Diagnosis Date   Cataracts, bilateral 02/2011   and suspected glaucoma, to return for f/u, no diabetic retinopathy   CKD stage 3 due to type 2 diabetes mellitus 2015   normal renal US, self referred to Dr Cherylann Ratel   Complex partial seizures 1990   from surgery for R temporal arachnoid cyst s/p drainage, possible continued sz so changed to lamotrigine (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 Tedd Sias endo --> 02/2016 decided to return to PCP for DM care   Past Surgical History:  Procedure Laterality Date   CARDIAC CATHETERIZATION  02/19/2009   No blockages (Dr. Christene Slates)   COLONOSCOPY  09/16/2005   hyperplastic polyps, rpt due 10 yrs    COLONOSCOPY WITH PROPOFOL N/A 12/11/2015   mult polyps, few TA, rpt 72yrs ([place])   COLONOSCOPY WITH PROPOFOL N/A 09/02/2021   TAs, HPs, int hem, distal ileal polyposis - benign, rpt yrs([place]so, Thomas Hoff, DO)   corrective surgery amblyopia  08/23/1955   Cystectomy or meningioma removal brain  08/22/1988   (unclear)   Left knee surgery  08/22/1969   Torn ACL   REPLACEMENT TOTAL KNEE  11/20/2009   Left ([facility] Dr. 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   Irregular heart beat 07/19/2021   BPH (benign  prostatic hyperplasia) 10/03/2020   Pain due to onychomycosis of toenails of both feet 06/18/2020   CKD stage 3 due to type 2 diabetes mellitus 10/23/2019   Type 2 diabetes mellitus with diabetic neuropathy, unspecified 10/21/2019   Impaired gait and mobility 10/14/2019   Postural dizziness with presyncope 10/13/2019   Ventricular premature complexes 10/13/2019   Urge urinary incontinence 09/26/2019   Depressed mood 09/26/2019   Advanced care planning/counseling discussion 09/22/2018   Weakness of both lower extremities 09/22/2018   History of hepatitis 01/05/2018   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   NAFLD (nonalcoholic fatty liver disease) 40/98/1191   Obesity, Class I, BMI 30.0-34.9 (see actual BMI) 09/18/2017   Polyarthralgia 02/15/2017   OSA (obstructive sleep apnea) 02/15/2017   Partial epilepsy with impairment of consciousness 01/29/2016   Low vitamin B12 level 05/27/2015   Elevated PSA 05/27/2015   Vitamin D deficiency 03/02/2015   Other long term (current) drug therapy 03/12/2012   Encounter for general adult medical examination with abnormal findings 04/26/2011   TOTAL KNEE REPLACEMENT, LEFT, HX OF 04/07/2010   Type 2 diabetes mellitus with diabetic nephropathy 03/30/2010   Hyperlipidemia associated with type 2 diabetes mellitus 03/30/2010   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  Rationale for Evaluation and Treatment: Rehabilitation  SUBJECTIVE:                                                                                                                                                                                             SUBJECTIVE STATEMENT:   Pt reports that he is doing well. Wife states that he is still not moving as much as she would  like at home, but getting to and from the kitchen okay.     Pt accompanied by: self and significant other  PERTINENT HISTORY: Diabetic neuropathy, atypical PD which MD thinks could be PSP, pt reports diagnosis is parkinson's disease.    PAIN:  Are you having pain? No  PRECAUTIONS: Fall  WEIGHT BEARING RESTRICTIONS: No  FALLS: Has patient fallen in last 6 months? Yes. Number of falls 1 fall when he slippe dand fell getting out of the shower.   LIVING ENVIRONMENT: Lives with: lives with their family and lives with their spouse Lives in: House/apartment Stairs: Yes: Internal: 13, 2 sets steps; on right going up and External: 5 steps; on right going up Has following equipment at home: Single point cane, Walker - 2 wheeled, shower chair, and elevated commode  PLOF: Independent with basic ADLs, Independent with household mobility with device, Independent with community mobility with device, and Needs assistance with homemaking  PATIENT GOALS: Lower and upper body strength as well as balance   OBJECTIVE:   DIAGNOSTIC FINDINGS: No significant findings   COGNITION: Overall cognitive status:  A little flat but otherwise WNL    SENSATION: Not tested  COORDINATION: Not tested   EDEMA:  None   MUSCLE TONE: Not tested, if in supine may be good to assess if pt has rigidity    POSTURE: rounded shoulders, forward head, and increased thoracic kyphosis  LOWER EXTREMITY ROM:   WNL in seated, in standing pt unable to obtain full erect posture.    LOWER EXTREMITY MMT:    MMT Right Eval Left Eval  Hip flexion 4 4-  Hip extension    Hip abduction 4+ 4+  Hip adduction 4 4  Hip internal rotation    Hip external rotation    Knee flexion 4+ 4+  Knee extension 4+ 4+  Ankle dorsiflexion 4+ 4+  Ankle plantarflexion 5 5  Ankle inversion    Ankle eversion    (Blank rows = not tested) All tested in seated position   BED MOBILITY:  Not tested at eval   TRANSFERS: Assistive  device utilized:  UE and cane    Sit to stand: CGA  Stand to sit: CGA Chair to chair: CGA Floor:  Not indicated for testing     CURB: Not tested   STAIRS: Not tested at eval, to look at in future assessments    GAIT: Gait pattern: step through pattern, decreased stride length, knee flexed in stance- Right, knee flexed in stance- Left, shuffling, and trunk flexed Distance walked: 30 feet  Assistive device utilized: Single point cane Level of assistance: CGA Comments: uses walls and other ibjects in clinic for steadying   FUNCTIONAL TESTS:  5 times sit to stand:  2/14:73.7 sec with significant UE assist and unable to stand in full erect posture. 3/19: 36.76 with UEpushing from arm rests  Timed up and go (TUG): 2/14: 42.45 sec with SPC and with UE assist on STS transfer. 3/19 55.2 sec with RW and UE assist for chair transfer. 10 meter walk test: 2/14: 57.80 with bariatric walker. 3/19: 44.31sec  Berg Balance Scale: 33/56   PATIENT SURVEYS:  FOTO 2/14: 40. 3/19: 50   TODAY'S TREATMENT: DATE: 12/06/22 TA  Nustep BLE/BUE AAROM reciprocal movement training x 3.5 min .   Sit<>stand 2 x 5 with UE pushing from thighs on first bout and pushing from bed or thigh on second bout. Instruction for weight shift and full hip extension to achieve erect posture   Standing step tap to 6 inch step with cadence from metronome   Seated therex  hip abduction x 10 GTB Hip flexion with 2 sec hold x 10 BLE, GTB  LAQ GTB x 10 BLE with 2 sec hold  Press down x 10 BLE GTB   Gait with RW x `139ft performed x 2, 4:33 on firest bout and 4:17 on second bout. Cues for posture, step length and AD management. Noted to have festination on this day through doorways and at corners, cues for step advancement to break festination.     PATIENT EDUCATION: Education details: POC  Person educated: Patient and Spouse Education method: Explanation Education comprehension: verbalized understanding  HOME  EXERCISE PROGRAM: Access Code: [email or web address] URL: [email or web address] Date: 10/25/2022 Prepared by: Grier Rocher  Exercises - Seated Hip Abduction with Pelvic Floor Contraction and Resistance Loop  - 1 x daily - 7 x weekly - 3 sets - 10 reps - Sitting Knee Extension with Resistance  - 1 x daily - 7 x weekly - 3 sets - 10 reps - Standing Hip Flexor Stretch  - 1 x daily - 7 x weekly - 3 sets - 10 reps  GOALS: Goals reviewed with patient? Yes  SHORT TERM GOALS: Target date: 11/02/2022  Patient will be independent in home exercise program to improve strength/mobility for better functional independence with ADLs. Baseline: provided on 3/5 Goal status: progressing    LONG TERM GOALS: Target date: 12/28/2022  1.  Patient (> 67 years old) will complete five times sit to stand test in < 30 seconds indicating an increased LE strength and improved balance. Baseline: 2/14: 73.7 sec with significant UE assistance and not able to obtain full erect posture in standing. 3/19: 36.76 with UE to push from arm rest Goal status: progressing   2.  Patient will increase FOTO score to equal to or greater than  47   to demonstrate statistically significant improvement in mobility and quality of life.  Baseline:2/14: 40 3/19: 50   Goal status: MET    3.  Patient will increase Berg Balance score by > 6 points to demonstrate decreased fall risk during functional activities. Baseline: 33/56  Goal status: progressing    4.  Patient will reduce timed up and go to <25 seconds with LRAD to reduce fall risk and demonstrate improved transfer/gait ability. Baseline: 2/14: 42.45 with SPC and UE assist with stand. 3/19: 55.2 sec with HDRW Goal status: progressing   5.  Patient will increase 10 meter walk test to >1.39m/s as to improve gait speed for better community ambulation and to reduce fall risk. Baseline: 57.80 sec with bariatric walker 3/19: 0.22 m/s (44.31 sec) Goal status: progressing      ASSESSMENT:  CLINICAL IMPRESSION:  Pt put forth good effort throughout session on this day. Wife present for session. Encouraged by PT to continue to perform HEP when not at therapy. Pt noted to have increased festination on this day and decreased gait speed compared to prior session. Pt will continue to benefit from skilled physical therapy intervention to address impairments, improve QOL, and attain therapy goals.    OBJECTIVE IMPAIRMENTS: Abnormal gait, decreased activity tolerance, decreased balance, decreased endurance, decreased knowledge of use of DME, decreased mobility, difficulty walking, decreased strength, decreased safety awareness, impaired perceived functional ability, and improper body mechanics.   ACTIVITY LIMITATIONS: bending, sitting, standing, squatting, stairs, transfers, bed mobility, bathing, toileting, and locomotion level  PARTICIPATION LIMITATIONS:  Pt reports no participation restrictions when prompted   PERSONAL FACTORS: 3+ comorbidities: HTN, depressed mood ( chart), T2DM  are also affecting patient's functional outcome.   REHAB POTENTIAL: Fair secondary to current functional level and progressive aspect of diagnosis   CLINICAL DECISION MAKING: Evolving/moderate complexity  EVALUATION COMPLEXITY: Moderate  PLAN:  PT FREQUENCY: 2x/week  PT DURATION: 12 weeks  PLANNED INTERVENTIONS: Therapeutic exercises, Therapeutic activity, Neuromuscular re-education, Balance training, Gait training, Patient/Family education, Self Care, Joint mobilization, Stair training, and Manual therapy   PLAN FOR NEXT SESSION:   BLE strengthening. Continue endurance and gait training for PD     Norman Herrlich PT ,DPT Physical Therapist- [facility]  [facility]   11:40 AM 12/06/22

## 2022-12-06 NOTE — Therapy (Signed)
OUTPATIENT PHYSICAL THERAPY NEURO TREATMENT  Patient Name: Kirk Bennett. MRN: [id number] DOB:1951/04/22, 72 y.o., male Today's Date: 12/06/2022   PCP: Eustaquio Boyden, MD  REFERRING PROVIDER: Demetrio Lapping,  MD    END OF SESSION:  PT End of Session - 12/06/22 1409     Visit Number 17    Number of Visits 24    Date for PT Re-Evaluation 12/28/22    Progress Note Due on Visit 20    PT Start Time 1350    Equipment Utilized During Treatment Gait belt    Activity Tolerance Patient tolerated treatment well;Patient limited by fatigue    Behavior During Therapy [facility] for tasks assessed/performed                Past Medical History:  Diagnosis Date   Cataracts, bilateral 02/2011   and suspected glaucoma, to return for f/u, no diabetic retinopathy   CKD stage 3 due to type 2 diabetes mellitus 2015   normal renal US, self referred to Dr Cherylann Ratel   Complex partial seizures 1990   from surgery for R temporal arachnoid cyst s/p drainage, possible continued sz so changed to lamotrigine (Dr. 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 Tedd Sias endo --> 02/2016 decided to return to PCP for DM care   Past Surgical History:  Procedure Laterality Date   CARDIAC CATHETERIZATION  02/19/2009   No blockages (Dr. Christene Slates)   COLONOSCOPY  09/16/2005   hyperplastic polyps, rpt due 10 yrs    COLONOSCOPY WITH PROPOFOL N/A 12/11/2015   mult polyps, few TA, rpt 11yrs ([place])   COLONOSCOPY WITH PROPOFOL N/A 09/02/2021   TAs, HPs, int hem, distal ileal polyposis - benign, rpt yrs([place], Thomas Hoff, DO)   corrective surgery  amblyopia  08/23/1955   Cystectomy or meningioma removal brain  08/22/1988   (unclear)   Left knee surgery  08/22/1969   Torn ACL   REPLACEMENT TOTAL KNEE  11/20/2009   Left ([facility] Dr. 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   Irregular heart beat 07/19/2021   BPH (benign prostatic hyperplasia) 10/03/2020   Pain due to onychomycosis of toenails of both feet 06/18/2020   CKD stage 3 due to type 2 diabetes mellitus 10/23/2019   Type 2 diabetes mellitus with diabetic neuropathy, unspecified 10/21/2019   Impaired gait and mobility 10/14/2019   Postural dizziness with presyncope 10/13/2019   Ventricular premature complexes 10/13/2019   Urge urinary incontinence 09/26/2019   Depressed mood 09/26/2019   Advanced care planning/counseling discussion 09/22/2018   Weakness of both lower extremities 09/22/2018   History of hepatitis 01/05/2018   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   NAFLD (nonalcoholic fatty liver disease) 40/98/1191   Obesity, Class I, BMI 30.0-34.9 (see actual BMI) 09/18/2017   Polyarthralgia 02/15/2017   OSA (obstructive sleep apnea) 02/15/2017   Partial epilepsy with impairment of consciousness 01/29/2016   Low vitamin B12 level 05/27/2015   Elevated PSA 05/27/2015   Vitamin  D deficiency 03/02/2015   Other long term (current) drug therapy 03/12/2012   Encounter for general adult medical examination with abnormal findings 04/26/2011   TOTAL KNEE REPLACEMENT, LEFT, HX OF 04/07/2010   Type 2 diabetes mellitus with diabetic nephropathy 03/30/2010   Hyperlipidemia associated with type 2 diabetes mellitus 03/30/2010   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 03/30/2010   Essential hypertension, benign 03/30/2010    ONSET DATE: 05/2021  REFERRING DIAG: G20.C (ICD-10-CM) - Parkinsonism, unspecified   THERAPY DIAG:  Abnormality of gait and  mobility  Difficulty in walking, not elsewhere classified  Unsteadiness on feet  Other abnormalities of gait and mobility  Repeated falls  Abnormal posture  Muscle weakness (generalized)  Rationale for Evaluation and Treatment: Rehabilitation  SUBJECTIVE:                                                                                                                                                                                             SUBJECTIVE STATEMENT:   Pt reports that he is doing well. No updates from wife or pt on this day. No pain reported.    Pt accompanied by: self and significant other  PERTINENT HISTORY: Diabetic neuropathy, atypical PD which MD thinks could be PSP, pt reports diagnosis is parkinson's disease.    PAIN:  Are you having pain? No  PRECAUTIONS: Fall  WEIGHT BEARING RESTRICTIONS: No  FALLS: Has patient fallen in last 6 months? Yes. Number of falls 1 fall when he slippe dand fell getting out of the shower.   LIVING ENVIRONMENT: Lives with: lives with their family and lives with their spouse Lives in: House/apartment Stairs: Yes: Internal: 13, 2 sets steps; on right going up and External: 5 steps; on right going up Has following equipment at home: Single point cane, Walker - 2 wheeled, shower chair, and elevated commode  PLOF: Independent with basic ADLs, Independent with household mobility with device, Independent with community mobility with device, and Needs assistance with homemaking  PATIENT GOALS: Lower and upper body strength as well as balance   OBJECTIVE:   DIAGNOSTIC FINDINGS: No significant findings   COGNITION: Overall cognitive status:  A little flat but otherwise WNL    SENSATION: Not tested  COORDINATION: Not tested   EDEMA:  None   MUSCLE TONE: Not tested, if in supine may be good to assess if pt has rigidity    POSTURE: rounded shoulders, forward head, and increased thoracic kyphosis  LOWER EXTREMITY ROM:    WNL in seated, in standing pt unable to obtain full erect posture.    LOWER EXTREMITY MMT:    MMT  Right Eval Left Eval  Hip flexion 4 4-  Hip extension    Hip abduction 4+ 4+  Hip adduction 4 4  Hip internal rotation    Hip external rotation    Knee flexion 4+ 4+  Knee extension 4+ 4+  Ankle dorsiflexion 4+ 4+  Ankle plantarflexion 5 5  Ankle inversion    Ankle eversion    (Blank rows = not tested) All tested in seated position   BED MOBILITY:  Not tested at eval   TRANSFERS: Assistive device utilized:  UE and cane    Sit to stand: CGA Stand to sit: CGA Chair to chair: CGA Floor:  Not indicated for testing     CURB: Not tested   STAIRS: Not tested at eval, to look at in future assessments    GAIT: Gait pattern: step through pattern, decreased stride length, knee flexed in stance- Right, knee flexed in stance- Left, shuffling, and trunk flexed Distance walked: 30 feet  Assistive device utilized: Single point cane Level of assistance: CGA Comments: uses walls and other ibjects in clinic for steadying   FUNCTIONAL TESTS:  5 times sit to stand:  2/14:73.7 sec with significant UE assist and unable to stand in full erect posture. 3/19: 36.76 with UEpushing from arm rests  Timed up and go (TUG): 2/14: 42.45 sec with SPC and with UE assist on STS transfer. 3/19 55.2 sec with RW and UE assist for chair transfer. 10 meter walk test: 2/14: 57.80 with bariatric walker. 3/19: 44.31sec  Berg Balance Scale: 33/56   PATIENT SURVEYS:  FOTO 2/14: 40. 3/19: 50   TODAY'S TREATMENT: DATE: 12/06/22 TA  Nustep BLE/BUE AAROM reciprocal movement training level 2, x 5 min . Bil feet at 14 AND BUE at 16  Standing therex.  Forward step over 1/2 bolster x 8 bil  Lateral step over 1/2 bolster x 8 bil  Foot tap on 5 inch step x 8 bil  UE supported on parallel bars of RW in parallel bars throughout. Therapeutic rest breaks between bouts required by pt.   Gait in rehab gym  performed with HD RW on this day. Performed x 45ft, 55ft. Noted to have improved posture and step length on this day compared to prior session  Sit<>stand transfers performed with supervision assist on this day with increased speed following instruction from PT throughout session.    PATIENT EDUCATION: Education details: POC  Person educated: Patient and Spouse Education method: Explanation Education comprehension: verbalized understanding  HOME EXERCISE PROGRAM: Access Code: [email or web address] URL: [email or web address] Date: 10/25/2022 Prepared by: Grier Rocher  Exercises - Seated Hip Abduction with Pelvic Floor Contraction and Resistance Loop  - 1 x daily - 7 x weekly - 3 sets - 10 reps - Sitting Knee Extension with Resistance  - 1 x daily - 7 x weekly - 3 sets - 10 reps - Standing Hip Flexor Stretch  - 1 x daily - 7 x weekly - 3 sets - 10 reps  GOALS: Goals reviewed with patient? Yes  SHORT TERM GOALS: Target date: 11/02/2022  Patient will be independent in home exercise program to improve strength/mobility for better functional independence with ADLs. Baseline: provided on 3/5 Goal status: progressing    LONG TERM GOALS: Target date: 12/28/2022  1.  Patient (> 62 years old) will complete five times sit to stand test in < 30 seconds indicating an increased LE strength and improved balance. Baseline: 2/14: 73.7 sec with significant UE assistance and not able to obtain  full erect posture in standing. 3/19: 36.76 with UE to push from arm rest Goal status: progressing   2.  Patient will increase FOTO score to equal to or greater than  47   to demonstrate statistically significant improvement in mobility and quality of life.  Baseline:2/14: 40 3/19: 50   Goal status: MET    3.  Patient will increase Berg Balance score by > 6 points to demonstrate decreased fall risk during functional activities. Baseline: 33/56  Goal status: progressing    4.  Patient will reduce timed  up and go to <25 seconds with LRAD to reduce fall risk and demonstrate improved transfer/gait ability. Baseline: 2/14: 42.45 with SPC and UE assist with stand. 3/19: 55.2 sec with HDRW Goal status: progressing   5.  Patient will increase 10 meter walk test to >1.52m/s as to improve gait speed for better community ambulation and to reduce fall risk. Baseline: 57.80 sec with bariatric walker 3/19: 0.22 m/s (44.31 sec) Goal status: progressing     ASSESSMENT:  CLINICAL IMPRESSION:  Pt put forth good effort throughout session on this day. PT treatment focused on functional movement with emphasis on increased step length and height height while maintaining neutral posture. Noted to have increased step length and height with gait on this day reducing fall risk and improving gait speed. Pt will continue to benefit from skilled physical therapy intervention to address impairments, improve QOL, and attain therapy goals.    OBJECTIVE IMPAIRMENTS: Abnormal gait, decreased activity tolerance, decreased balance, decreased endurance, decreased knowledge of use of DME, decreased mobility, difficulty walking, decreased strength, decreased safety awareness, impaired perceived functional ability, and improper body mechanics.   ACTIVITY LIMITATIONS: bending, sitting, standing, squatting, stairs, transfers, bed mobility, bathing, toileting, and locomotion level  PARTICIPATION LIMITATIONS:  Pt reports no participation restrictions when prompted   PERSONAL FACTORS: 3+ comorbidities: HTN, depressed mood ( chart), T2DM  are also affecting patient's functional outcome.   REHAB POTENTIAL: Fair secondary to current functional level and progressive aspect of diagnosis   CLINICAL DECISION MAKING: Evolving/moderate complexity  EVALUATION COMPLEXITY: Moderate  PLAN:  PT FREQUENCY: 2x/week  PT DURATION: 12 weeks  PLANNED INTERVENTIONS: Therapeutic exercises, Therapeutic activity, Neuromuscular re-education,  Balance training, Gait training, Patient/Family education, Self Care, Joint mobilization, Stair training, and Manual therapy   PLAN FOR NEXT SESSION:   BLE strengthening and  continue endurance and gait training to improve for PD     Golden Pop PT ,DPT Physical Therapist- [facility]  [facility]   2:12 PM 12/06/22

## 2022-12-08 ENCOUNTER — Encounter: Payer: Self-pay | Admitting: Physical Therapy

## 2022-12-08 ENCOUNTER — Ambulatory Visit: Payer: 59

## 2022-12-08 DIAGNOSIS — R2689 Other abnormalities of gait and mobility: Secondary | ICD-10-CM

## 2022-12-08 DIAGNOSIS — R2681 Unsteadiness on feet: Secondary | ICD-10-CM

## 2022-12-08 DIAGNOSIS — R269 Unspecified abnormalities of gait and mobility: Secondary | ICD-10-CM

## 2022-12-08 DIAGNOSIS — R262 Difficulty in walking, not elsewhere classified: Secondary | ICD-10-CM

## 2022-12-08 DIAGNOSIS — R296 Repeated falls: Secondary | ICD-10-CM

## 2022-12-08 NOTE — Therapy (Signed)
OUTPATIENT PHYSICAL THERAPY NEURO TREATMENT  Patient Name: Kirk Bennett. MRN: [id number] DOB:07-30-1951, 72 y.o., male Today's Date: 12/08/2022   PCP: Eustaquio Boyden, MD  REFERRING PROVIDER: Demetrio Lapping,  MD    END OF SESSION:  PT End of Session - 12/08/22 1351     Visit Number 18    Number of Visits 24    Date for PT Re-Evaluation 12/28/22    Progress Note Due on Visit 20    PT Start Time 1347    PT Stop Time 1425    PT Time Calculation (min) 38 min    Equipment Utilized During Treatment Gait belt    Activity Tolerance Other (comment)    Behavior During Therapy WFL for tasks assessed/performed                 Past Medical History:  Diagnosis Date   Cataracts, bilateral 02/2011   and suspected glaucoma, to return for f/u, no diabetic retinopathy   CKD stage 3 due to type 2 diabetes mellitus 2015   normal renal US, self referred to Dr Cherylann Ratel   Complex partial seizures 1990   from surgery for R temporal arachnoid cyst s/p drainage, possible continued sz so changed to lamotrigine (Dr. 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 Tedd Sias endo --> 02/2016 decided to return to PCP for DM care   Past Surgical History:  Procedure Laterality Date   CARDIAC CATHETERIZATION  02/19/2009   No blockages (Dr. Christene Slates)   COLONOSCOPY  09/16/2005   hyperplastic polyps, rpt due 10 yrs    COLONOSCOPY WITH PROPOFOL N/A 12/11/2015   mult polyps, few TA, rpt 73yrs ([place])   COLONOSCOPY WITH PROPOFOL N/A 09/02/2021   TAs, HPs, int hem, distal ileal polyposis - benign, rpt yrs([facility], Thomas Hoff, DO)    corrective surgery amblyopia  08/23/1955   Cystectomy or meningioma removal brain  08/22/1988   (unclear)   Left knee surgery  08/22/1969   Torn ACL   REPLACEMENT TOTAL KNEE  11/20/2009   Left ([facility] Dr. 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   Irregular heart beat 07/19/2021   BPH (benign prostatic hyperplasia) 10/03/2020   Pain due to onychomycosis of toenails of both feet 06/18/2020   CKD stage 3 due to type 2 diabetes mellitus 10/23/2019   Type 2 diabetes mellitus with diabetic neuropathy, unspecified 10/21/2019   Impaired gait and mobility 10/14/2019   Postural dizziness with presyncope 10/13/2019   Ventricular premature complexes 10/13/2019   Urge urinary incontinence 09/26/2019   Depressed mood 09/26/2019   Advanced care planning/counseling discussion 09/22/2018   Weakness of both lower extremities 09/22/2018   History of hepatitis 01/05/2018   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   NAFLD (nonalcoholic fatty liver disease) 40/98/1191   Obesity, Class I, BMI 30.0-34.9 (see actual BMI) 09/18/2017   Polyarthralgia 02/15/2017   OSA (obstructive sleep apnea) 02/15/2017   Partial epilepsy with impairment of consciousness 01/29/2016   Low  vitamin B12 level 05/27/2015   Elevated PSA 05/27/2015   Vitamin D deficiency 03/02/2015   Other long term (current) drug therapy 03/12/2012   Encounter for general adult medical examination with abnormal findings 04/26/2011   TOTAL KNEE REPLACEMENT, LEFT, HX OF 04/07/2010   Type 2 diabetes mellitus with diabetic nephropathy 03/30/2010   Hyperlipidemia associated with type 2 diabetes mellitus 03/30/2010   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 03/30/2010   Essential hypertension, benign 03/30/2010    ONSET DATE: 05/2021  REFERRING DIAG: G20.C (ICD-10-CM) - Parkinsonism, unspecified   THERAPY DIAG:   Abnormality of gait and mobility  Difficulty in walking, not elsewhere classified  Unsteadiness on feet  Other abnormalities of gait and mobility  Repeated falls  Rationale for Evaluation and Treatment: Rehabilitation  SUBJECTIVE:                                                                                                                                                                                             SUBJECTIVE STATEMENT:   Pt denies falls or LOB. No concerns today and no new updates.    Pt accompanied by: self and significant other  PERTINENT HISTORY: Diabetic neuropathy, atypical PD which MD thinks could be PSP, pt reports diagnosis is parkinson's disease.    PAIN:  Are you having pain? No  PRECAUTIONS: Fall  WEIGHT BEARING RESTRICTIONS: No  FALLS: Has patient fallen in last 6 months? Yes. Number of falls 1 fall when he slippe dand fell getting out of the shower.   LIVING ENVIRONMENT: Lives with: lives with their family and lives with their spouse Lives in: House/apartment Stairs: Yes: Internal: 13, 2 sets steps; on right going up and External: 5 steps; on right going up Has following equipment at home: Single point cane, Walker - 2 wheeled, shower chair, and elevated commode  PLOF: Independent with basic ADLs, Independent with household mobility with device, Independent with community mobility with device, and Needs assistance with homemaking  PATIENT GOALS: Lower and upper body strength as well as balance   OBJECTIVE:   DIAGNOSTIC FINDINGS: No significant findings   COGNITION: Overall cognitive status:  A little flat but otherwise WNL    SENSATION: Not tested  COORDINATION: Not tested   EDEMA:  None   MUSCLE TONE: Not tested, if in supine may be good to assess if pt has rigidity    POSTURE: rounded shoulders, forward head, and increased thoracic kyphosis  LOWER EXTREMITY ROM:   WNL in seated, in standing pt unable to obtain full  erect posture.    LOWER EXTREMITY MMT:    MMT Right Eval  Left Eval  Hip flexion 4 4-  Hip extension    Hip abduction 4+ 4+  Hip adduction 4 4  Hip internal rotation    Hip external rotation    Knee flexion 4+ 4+  Knee extension 4+ 4+  Ankle dorsiflexion 4+ 4+  Ankle plantarflexion 5 5  Ankle inversion    Ankle eversion    (Blank rows = not tested) All tested in seated position   BED MOBILITY:  Not tested at eval   TRANSFERS: Assistive device utilized:  UE and cane    Sit to stand: CGA Stand to sit: CGA Chair to chair: CGA Floor:  Not indicated for testing     CURB: Not tested   STAIRS: Not tested at eval, to look at in future assessments    GAIT: Gait pattern: step through pattern, decreased stride length, knee flexed in stance- Right, knee flexed in stance- Left, shuffling, and trunk flexed Distance walked: 30 feet  Assistive device utilized: Single point cane Level of assistance: CGA Comments: uses walls and other ibjects in clinic for steadying   FUNCTIONAL TESTS:  5 times sit to stand:  2/14:73.7 sec with significant UE assist and unable to stand in full erect posture. 3/19: 36.76 with UEpushing from arm rests  Timed up and go (TUG): 2/14: 42.45 sec with SPC and with UE assist on STS transfer. 3/19 55.2 sec with RW and UE assist for chair transfer. 10 meter walk test: 2/14: 57.80 with bariatric walker. 3/19: 44.31sec  Berg Balance Scale: 33/56   PATIENT SURVEYS:  FOTO 2/14: 40. 3/19: 50   TODAY'S TREATMENT: DATE: 12/08/22  Nustep BLE/BUE AAROM reciprocal movement training level 2, x 6 min . Bil feet at 14 AND BUE at 16. \  Gait with BRW ~60' to bathroom. Pt with incidental urinary episode. Pt independent in bathroom. Pt requesting session to be complete due to incident. Pt aware of future appointments next week. No billing performed for this session.    PATIENT EDUCATION: Education details: POC  Person educated: Patient and Spouse Education  method: Explanation Education comprehension: verbalized understanding  HOME EXERCISE PROGRAM: Access Code: [email or web address] URL: [email or web address] Date: 10/25/2022 Prepared by: Grier Rocher  Exercises - Seated Hip Abduction with Pelvic Floor Contraction and Resistance Loop  - 1 x daily - 7 x weekly - 3 sets - 10 reps - Sitting Knee Extension with Resistance  - 1 x daily - 7 x weekly - 3 sets - 10 reps - Standing Hip Flexor Stretch  - 1 x daily - 7 x weekly - 3 sets - 10 reps  GOALS: Goals reviewed with patient? Yes  SHORT TERM GOALS: Target date: 11/02/2022  Patient will be independent in home exercise program to improve strength/mobility for better functional independence with ADLs. Baseline: provided on 3/5 Goal status: progressing    LONG TERM GOALS: Target date: 12/28/2022  1.  Patient (> 78 years old) will complete five times sit to stand test in < 30 seconds indicating an increased LE strength and improved balance. Baseline: 2/14: 73.7 sec with significant UE assistance and not able to obtain full erect posture in standing. 3/19: 36.76 with UE to push from arm rest Goal status: progressing   2.  Patient will increase FOTO score to equal to or greater than  47   to demonstrate statistically significant improvement in mobility and quality of life.  Baseline:2/14: 40 3/19: 50   Goal status: MET    3.  Patient will increase Sharlene Motts  Balance score by > 6 points to demonstrate decreased fall risk during functional activities. Baseline: 33/56  Goal status: progressing    4.  Patient will reduce timed up and go to <25 seconds with LRAD to reduce fall risk and demonstrate improved transfer/gait ability. Baseline: 2/14: 42.45 with SPC and UE assist with stand. 3/19: 55.2 sec with HDRW Goal status: progressing   5.  Patient will increase 10 meter walk test to >1.89m/s as to improve gait speed for better community ambulation and to reduce fall risk. Baseline: 57.80 sec with  bariatric walker 3/19: 0.22 m/s (44.31 sec) Goal status: progressing     ASSESSMENT:  CLINICAL IMPRESSION: Session limited today due to incidental urinary episode. Pt encouraged to follow up per schedule and POC. Pt understanding. Pt will continue to benefit from skilled PT services to address gait, balance and strength deficits to reduce falls risk and optimize independence with mobility and ADL's.   OBJECTIVE IMPAIRMENTS: Abnormal gait, decreased activity tolerance, decreased balance, decreased endurance, decreased knowledge of use of DME, decreased mobility, difficulty walking, decreased strength, decreased safety awareness, impaired perceived functional ability, and improper body mechanics.   ACTIVITY LIMITATIONS: bending, sitting, standing, squatting, stairs, transfers, bed mobility, bathing, toileting, and locomotion level  PARTICIPATION LIMITATIONS:  Pt reports no participation restrictions when prompted   PERSONAL FACTORS: 3+ comorbidities: HTN, depressed mood ( chart), T2DM  are also affecting patient's functional outcome.   REHAB POTENTIAL: Fair secondary to current functional level and progressive aspect of diagnosis   CLINICAL DECISION MAKING: Evolving/moderate complexity  EVALUATION COMPLEXITY: Moderate  PLAN:  PT FREQUENCY: 2x/week  PT DURATION: 12 weeks  PLANNED INTERVENTIONS: Therapeutic exercises, Therapeutic activity, Neuromuscular re-education, Balance training, Gait training, Patient/Family education, Self Care, Joint mobilization, Stair training, and Manual therapy   PLAN FOR NEXT SESSION:   BLE strengthening. Continue endurance and gait training for PD     Delphia Grates. Fairly IV, PT, DPT Physical Therapist- [facility]  [facility]  12/08/22

## 2022-12-13 ENCOUNTER — Ambulatory Visit: Payer: 59 | Admitting: Physical Therapy

## 2022-12-13 DIAGNOSIS — M6281 Muscle weakness (generalized): Secondary | ICD-10-CM

## 2022-12-13 DIAGNOSIS — R293 Abnormal posture: Secondary | ICD-10-CM

## 2022-12-13 DIAGNOSIS — R269 Unspecified abnormalities of gait and mobility: Secondary | ICD-10-CM | POA: Diagnosis not present

## 2022-12-13 DIAGNOSIS — R262 Difficulty in walking, not elsewhere classified: Secondary | ICD-10-CM

## 2022-12-13 DIAGNOSIS — R2689 Other abnormalities of gait and mobility: Secondary | ICD-10-CM

## 2022-12-13 DIAGNOSIS — R2681 Unsteadiness on feet: Secondary | ICD-10-CM

## 2022-12-13 DIAGNOSIS — R296 Repeated falls: Secondary | ICD-10-CM

## 2022-12-13 NOTE — Therapy (Signed)
OUTPATIENT PHYSICAL THERAPY NEURO TREATMENT  Patient Name: Kirk Bennett. MRN: [id number] DOB:09/25/1950, 72 y.o., male Today's Date: 12/13/2022   PCP: Eustaquio Boyden, MD  REFERRING PROVIDER: Demetrio Lapping,  MD    END OF SESSION:  PT End of Session - 12/13/22 1441     Visit Number 19    Number of Visits 24    Date for PT Re-Evaluation 12/28/22    Progress Note Due on Visit 20    PT Start Time 1435    Equipment Utilized During Treatment Gait belt    Activity Tolerance Other (comment)    Behavior During Therapy [facility] for tasks assessed/performed                 Past Medical History:  Diagnosis Date   Cataracts, bilateral 02/2011   and suspected glaucoma, to return for f/u, no diabetic retinopathy   CKD stage 3 due to type 2 diabetes mellitus 2015   normal renal US, self referred to Dr Cherylann Ratel   Complex partial seizures 1990   from surgery for R temporal arachnoid cyst s/p drainage, possible continued sz so changed to lamotrigine (Dr. 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 Tedd Sias endo --> 02/2016 decided to return to PCP for DM care   Past Surgical History:  Procedure Laterality Date   CARDIAC CATHETERIZATION  02/19/2009   No blockages (Dr. Christene Slates)   COLONOSCOPY  09/16/2005   hyperplastic polyps, rpt due 10 yrs    COLONOSCOPY WITH PROPOFOL N/A 12/11/2015   mult polyps, few TA, rpt 27yrs ([place])   COLONOSCOPY WITH PROPOFOL N/A 09/02/2021   TAs, HPs, int hem, distal ileal polyposis - benign, rpt yrs([place], Thomas Hoff, DO)   corrective surgery amblyopia  08/23/1955   Cystectomy or  meningioma removal brain  08/22/1988   (unclear)   Left knee surgery  08/22/1969   Torn ACL   REPLACEMENT TOTAL KNEE  11/20/2009   Left ([facility] Dr. 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   Irregular heart beat 07/19/2021   BPH (benign prostatic hyperplasia) 10/03/2020   Pain due to onychomycosis of toenails of both feet 06/18/2020   CKD stage 3 due to type 2 diabetes mellitus 10/23/2019   Type 2 diabetes mellitus with diabetic neuropathy, unspecified 10/21/2019   Impaired gait and mobility 10/14/2019   Postural dizziness with presyncope 10/13/2019   Ventricular premature complexes 10/13/2019   Urge urinary incontinence 09/26/2019   Depressed mood 09/26/2019   Advanced care planning/counseling discussion 09/22/2018   Weakness of both lower extremities 09/22/2018   History of hepatitis 01/05/2018   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   NAFLD (nonalcoholic fatty liver disease) 74/25/9563   Obesity, Class I, BMI 30.0-34.9 (see actual BMI) 09/18/2017   Polyarthralgia 02/15/2017   OSA (obstructive sleep apnea) 02/15/2017   Partial epilepsy with impairment of consciousness 01/29/2016   Low vitamin B12 level 05/27/2015   Elevated PSA 05/27/2015   Vitamin D deficiency 03/02/2015  Other long term (current) drug therapy 03/12/2012   Encounter for general adult medical examination with abnormal findings 04/26/2011   TOTAL KNEE REPLACEMENT, LEFT, HX OF 04/07/2010   Type 2 diabetes mellitus with diabetic nephropathy 03/30/2010   Hyperlipidemia associated with type 2 diabetes mellitus 03/30/2010   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 03/30/2010   Essential hypertension, benign 03/30/2010    ONSET DATE: 05/2021  REFERRING DIAG: G20.C (ICD-10-CM) - Parkinsonism, unspecified   THERAPY DIAG:  Abnormality of gait and mobility  Difficulty in walking, not  elsewhere classified  Unsteadiness on feet  Other abnormalities of gait and mobility  Repeated falls  Abnormal posture  Muscle weakness (generalized)  Rationale for Evaluation and Treatment: Rehabilitation  SUBJECTIVE:                                                                                                                                                                                             SUBJECTIVE STATEMENT:   Pt denies falls or LOB. No concerns today and no new updates.    Pt accompanied by: self and significant other  PERTINENT HISTORY: Diabetic neuropathy, atypical PD which MD thinks could be PSP, pt reports diagnosis is parkinson's disease.    PAIN:  Are you having pain? No  PRECAUTIONS: Fall  WEIGHT BEARING RESTRICTIONS: No  FALLS: Has patient fallen in last 6 months? Yes. Number of falls 1 fall when he slippe dand fell getting out of the shower.   LIVING ENVIRONMENT: Lives with: lives with their family and lives with their spouse Lives in: House/apartment Stairs: Yes: Internal: 13, 2 sets steps; on right going up and External: 5 steps; on right going up Has following equipment at home: Single point cane, Walker - 2 wheeled, shower chair, and elevated commode  PLOF: Independent with basic ADLs, Independent with household mobility with device, Independent with community mobility with device, and Needs assistance with homemaking  PATIENT GOALS: Lower and upper body strength as well as balance   OBJECTIVE:   DIAGNOSTIC FINDINGS: No significant findings   COGNITION: Overall cognitive status:  A little flat but otherwise WNL    SENSATION: Not tested  COORDINATION: Not tested   EDEMA:  None   MUSCLE TONE: Not tested, if in supine may be good to assess if pt has rigidity    POSTURE: rounded shoulders, forward head, and increased thoracic kyphosis  LOWER EXTREMITY ROM:   WNL in seated, in standing pt unable to obtain full erect posture.     LOWER EXTREMITY MMT:    MMT Right Eval Left Eval  Hip flexion 4 4-  Hip extension  Hip abduction 4+ 4+  Hip adduction 4 4  Hip internal rotation    Hip external rotation    Knee flexion 4+ 4+  Knee extension 4+ 4+  Ankle dorsiflexion 4+ 4+  Ankle plantarflexion 5 5  Ankle inversion    Ankle eversion    (Blank rows = not tested) All tested in seated position   BED MOBILITY:  Not tested at eval   TRANSFERS: Assistive device utilized:  UE and cane    Sit to stand: CGA Stand to sit: CGA Chair to chair: CGA Floor:  Not indicated for testing     CURB: Not tested   STAIRS: Not tested at eval, to look at in future assessments    GAIT: Gait pattern: step through pattern, decreased stride length, knee flexed in stance- Right, knee flexed in stance- Left, shuffling, and trunk flexed Distance walked: 30 feet  Assistive device utilized: Single point cane Level of assistance: CGA Comments: uses walls and other ibjects in clinic for steadying   FUNCTIONAL TESTS:  5 times sit to stand:  2/14:73.7 sec with significant UE assist and unable to stand in full erect posture. 3/19: 36.76 with UEpushing from arm rests  Timed up and go (TUG): 2/14: 42.45 sec with SPC and with UE assist on STS transfer. 3/19 55.2 sec with RW and UE assist for chair transfer. 10 meter walk test: 2/14: 57.80 with bariatric walker. 3/19: 44.31sec  Berg Balance Scale: 33/56   PATIENT SURVEYS:  FOTO 2/14: 40. 3/19: 50   TODAY'S TREATMENT: DATE: 12/08/22   Stand pivot transfer to elevated arm chair with min assist to stand from transport chair height.   PT instructed pt in standing therex:  Hip abduction x 12 BIL Foot tap on 3 in airex pain and on 6 inch airex pads stacked. Performed each x 12 bil with cues for improved HS activation to return to start position.  Hip extension x 12 bil  Calf raise, toe raise x 12 each .   Gait with RW through rehab gym 2 x 13ft with supervision assist-CGA  for  safety. only Min cues for improved step length and cadence. Noted improvement in AD management in turns     Pt required multiple seated therapeutic rest breaks throughout session to prevent severe fatigue.   PATIENT EDUCATION: Education details: Pt educated throughout session about proper posture and technique with exercises. Improved exercise technique, movement at target joints, use of target muscles after min to mod verbal, visual, tactile cues.   Person educated: Patient and Spouse Education method: Explanation Education comprehension: verbalized understanding  HOME EXERCISE PROGRAM: Access Code: [email or web address] URL: [email or web address] Date: 10/25/2022 Prepared by: Grier Rocher  Exercises - Seated Hip Abduction with Pelvic Floor Contraction and Resistance Loop  - 1 x daily - 7 x weekly - 3 sets - 10 reps - Sitting Knee Extension with Resistance  - 1 x daily - 7 x weekly - 3 sets - 10 reps - Standing Hip Flexor Stretch  - 1 x daily - 7 x weekly - 3 sets - 10 reps  GOALS: Goals reviewed with patient? Yes  SHORT TERM GOALS: Target date: 11/02/2022  Patient will be independent in home exercise program to improve strength/mobility for better functional independence with ADLs. Baseline: provided on 3/5 Goal status: progressing    LONG TERM GOALS: Target date: 12/28/2022  1.  Patient (> 31 years old) will complete five times sit to stand test in < 30 seconds indicating an increased LE  strength and improved balance. Baseline: 2/14: 73.7 sec with significant UE assistance and not able to obtain full erect posture in standing. 3/19: 36.76 with UE to push from arm rest Goal status: progressing   2.  Patient will increase FOTO score to equal to or greater than  47   to demonstrate statistically significant improvement in mobility and quality of life.  Baseline:2/14: 40 3/19: 50   Goal status: MET    3.  Patient will increase Berg Balance score by > 6 points to demonstrate  decreased fall risk during functional activities. Baseline: 33/56  Goal status: progressing    4.  Patient will reduce timed up and go to <25 seconds with LRAD to reduce fall risk and demonstrate improved transfer/gait ability. Baseline: 2/14: 42.45 with SPC and UE assist with stand. 3/19: 55.2 sec with HDRW Goal status: progressing   5.  Patient will increase 10 meter walk test to >1.22m/s as to improve gait speed for better community ambulation and to reduce fall risk. Baseline: 57.80 sec with bariatric walker 3/19: 0.22 m/s (44.31 sec) Goal status: progressing     ASSESSMENT:  CLINICAL IMPRESSION: Pt performed well throughout therapy session. Pt noted to have improved posture and step length throughout all gait training and standing therex. No LOB and only min cues for improved exercise technique on this day.  Pt will continue to benefit from skilled PT services to address gait, balance and strength deficits to reduce falls risk and optimize independence with mobility and ADL's.   OBJECTIVE IMPAIRMENTS: Abnormal gait, decreased activity tolerance, decreased balance, decreased endurance, decreased knowledge of use of DME, decreased mobility, difficulty walking, decreased strength, decreased safety awareness, impaired perceived functional ability, and improper body mechanics.   ACTIVITY LIMITATIONS: bending, sitting, standing, squatting, stairs, transfers, bed mobility, bathing, toileting, and locomotion level  PARTICIPATION LIMITATIONS:  Pt reports no participation restrictions when prompted   PERSONAL FACTORS: 3+ comorbidities: HTN, depressed mood ( chart), T2DM  are also affecting patient's functional outcome.   REHAB POTENTIAL: Fair secondary to current functional level and progressive aspect of diagnosis   CLINICAL DECISION MAKING: Evolving/moderate complexity  EVALUATION COMPLEXITY: Moderate  PLAN:  PT FREQUENCY: 2x/week  PT DURATION: 12 weeks  PLANNED INTERVENTIONS:  Therapeutic exercises, Therapeutic activity, Neuromuscular re-education, Balance training, Gait training, Patient/Family education, Self Care, Joint mobilization, Stair training, and Manual therapy   PLAN FOR NEXT SESSION:   BLE strengthening. Continue endurance and gait training for PD     Grier Rocher PT, DPT  Physical Therapist - [facility]  [facility]  3:18 PM 12/13/22

## 2022-12-15 ENCOUNTER — Ambulatory Visit: Payer: 59 | Admitting: Physical Therapy

## 2022-12-15 DIAGNOSIS — R262 Difficulty in walking, not elsewhere classified: Secondary | ICD-10-CM

## 2022-12-15 DIAGNOSIS — R269 Unspecified abnormalities of gait and mobility: Secondary | ICD-10-CM | POA: Diagnosis not present

## 2022-12-15 DIAGNOSIS — M6281 Muscle weakness (generalized): Secondary | ICD-10-CM

## 2022-12-15 DIAGNOSIS — R2689 Other abnormalities of gait and mobility: Secondary | ICD-10-CM

## 2022-12-15 DIAGNOSIS — R293 Abnormal posture: Secondary | ICD-10-CM

## 2022-12-15 DIAGNOSIS — R296 Repeated falls: Secondary | ICD-10-CM

## 2022-12-15 DIAGNOSIS — R2681 Unsteadiness on feet: Secondary | ICD-10-CM

## 2022-12-15 NOTE — Therapy (Signed)
OUTPATIENT PHYSICAL THERAPY NEURO TREATMENT/ \ PHYSICAL THERAPY PROGRESS NOTE   Dates of reporting period  11/08/2022   to   12/15/2022    Patient Name: Kirk Bennett. MRN: [id number] DOB:1951/04/07, 72 y.o., male Today's Date: 12/13/2022   PCP: Eustaquio Boyden, MD  REFERRING PROVIDER: Demetrio Lapping,  MD    END OF SESSION:  PT End of Session - 12/15/22 1357     Visit Number 20    Number of Visits 24    Date for PT Re-Evaluation 12/28/22    Progress Note Due on Visit 30    PT Start Time 1348    PT Stop Time 1430    PT Time Calculation (min) 42 min    Equipment Utilized During Treatment Gait belt    Activity Tolerance Other (comment)    Behavior During Therapy WFL for tasks assessed/performed                 Past Medical History:  Diagnosis Date   Cataracts, bilateral 02/2011   and suspected glaucoma, to return for f/u, no diabetic retinopathy   CKD stage 3 due to type 2 diabetes mellitus 2015   normal renal US, self referred to Dr Cherylann Ratel   Complex partial seizures 1990   from surgery for R temporal arachnoid cyst s/p drainage, possible continued sz so changed to lamotrigine (Dr. 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 Tedd Sias endo --> 02/2016 decided to return to PCP for DM care   Past Surgical History:  Procedure Laterality Date   CARDIAC CATHETERIZATION  02/19/2009   No blockages (Dr. Christene Slates)   COLONOSCOPY  09/16/2005   hyperplastic polyps, rpt due 10 yrs    COLONOSCOPY WITH PROPOFOL N/A 12/11/2015   mult polyps, few TA, rpt 63yrs ([place])   COLONOSCOPY WITH PROPOFOL N/A  09/02/2021   TAs, HPs, int hem, distal ileal polyposis - benign, rpt yrs([place], Thomas Hoff, DO)   corrective surgery amblyopia  08/23/1955   Cystectomy or meningioma removal brain  08/22/1988   (unclear)   Left knee surgery  08/22/1969   Torn ACL   REPLACEMENT TOTAL KNEE  11/20/2009   Left ([facility] Dr. 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   Irregular heart beat 07/19/2021   BPH (benign prostatic hyperplasia) 10/03/2020   Pain due to onychomycosis of toenails of both feet 06/18/2020   CKD stage 3 due to type 2 diabetes mellitus 10/23/2019   Type 2 diabetes mellitus with diabetic neuropathy, unspecified 10/21/2019   Impaired gait and mobility 10/14/2019   Postural dizziness with presyncope 10/13/2019   Ventricular premature complexes 10/13/2019   Urge urinary incontinence 09/26/2019   Depressed mood 09/26/2019   Advanced care planning/counseling discussion 09/22/2018   Weakness of both lower extremities 09/22/2018   History of hepatitis 01/05/2018   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   NAFLD (nonalcoholic fatty liver disease) 40/98/1191   Obesity, Class I, BMI 30.0-34.9 (see actual BMI) 09/18/2017  Polyarthralgia 02/15/2017   OSA (obstructive sleep apnea) 02/15/2017   Partial epilepsy with impairment of consciousness 01/29/2016   Low vitamin B12 level 05/27/2015   Elevated PSA 05/27/2015   Vitamin D deficiency 03/02/2015   Other long term (current) drug therapy 03/12/2012   Encounter for general adult medical examination with abnormal findings 04/26/2011   TOTAL KNEE REPLACEMENT, LEFT, HX OF 04/07/2010   Type 2 diabetes mellitus with diabetic nephropathy 03/30/2010   Hyperlipidemia associated with type 2 diabetes mellitus 03/30/2010   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 03/30/2010   Essential hypertension, benign 03/30/2010    ONSET  DATE: 05/2021  REFERRING DIAG: G20.C (ICD-10-CM) - Parkinsonism, unspecified   THERAPY DIAG:  Abnormality of gait and mobility  Difficulty in walking, not elsewhere classified  Unsteadiness on feet  Other abnormalities of gait and mobility  Repeated falls  Abnormal posture  Muscle weakness (generalized)  Rationale for Evaluation and Treatment: Rehabilitation  SUBJECTIVE:                                                                                                                                                                                             SUBJECTIVE STATEMENT:   Pt denies falls or LOB. No concerns today and no new updates.    Pt accompanied by: self and significant other  PERTINENT HISTORY: Diabetic neuropathy, atypical PD which MD thinks could be PSP, pt reports diagnosis is parkinson's disease.    PAIN:  Are you having pain? No  PRECAUTIONS: Fall  WEIGHT BEARING RESTRICTIONS: No  FALLS: Has patient fallen in last 6 months? Yes. Number of falls 1 fall when he slippe dand fell getting out of the shower.   LIVING ENVIRONMENT: Lives with: lives with their family and lives with their spouse Lives in: House/apartment Stairs: Yes: Internal: 13, 2 sets steps; on right going up and External: 5 steps; on right going up Has following equipment at home: Single point cane, Walker - 2 wheeled, shower chair, and elevated commode  PLOF: Independent with basic ADLs, Independent with household mobility with device, Independent with community mobility with device, and Needs assistance with homemaking  PATIENT GOALS: Lower and upper body strength as well as balance   OBJECTIVE:   DIAGNOSTIC FINDINGS: No significant findings   COGNITION: Overall cognitive status:  A little flat but otherwise WNL    SENSATION: Not tested  COORDINATION: Not tested   EDEMA:  None   MUSCLE TONE: Not tested, if in supine may be good to assess if pt has rigidity     POSTURE: rounded shoulders, forward head, and increased thoracic kyphosis  LOWER  EXTREMITY ROM:   WNL in seated, in standing pt unable to obtain full erect posture.    LOWER EXTREMITY MMT:    MMT Right Eval Left Eval  Hip flexion 4 4-  Hip extension    Hip abduction 4+ 4+  Hip adduction 4 4  Hip internal rotation    Hip external rotation    Knee flexion 4+ 4+  Knee extension 4+ 4+  Ankle dorsiflexion 4+ 4+  Ankle plantarflexion 5 5  Ankle inversion    Ankle eversion    (Blank rows = not tested) All tested in seated position   BED MOBILITY:  Not tested at eval   TRANSFERS: Assistive device utilized:  UE and cane    Sit to stand: CGA Stand to sit: CGA Chair to chair: CGA Floor:  Not indicated for testing     CURB: Not tested   STAIRS: Not tested at eval, to look at in future assessments    GAIT: Gait pattern: step through pattern, decreased stride length, knee flexed in stance- Right, knee flexed in stance- Left, shuffling, and trunk flexed Distance walked: 30 feet  Assistive device utilized: Single point cane Level of assistance: CGA Comments: uses walls and other ibjects in clinic for steadying   FUNCTIONAL TESTS:  5 times sit to stand:  2/14:73.7 sec with significant UE assist and unable to stand in full erect posture. 3/19: 36.76 with UEpushing from arm rests  Timed up and go (TUG): 2/14: 42.45 sec with SPC and with UE assist on STS transfer. 3/19 55.2 sec with RW and UE assist for chair transfer. 10 meter walk test: 2/14: 57.80 with bariatric walker. 3/19: 44.31sec  Berg Balance Scale: 33/56   PATIENT SURVEYS:  FOTO 2/14: 40. 3/19: 50 4/25: 59    TODAY'S TREATMENT: DATE: 12/08/22   Completed FOTO  10 Meter Walk Test: Patient instructed to walk 10 meters (32.8 ft) as quickly and as safely as possible at their normal speed x2 and at a fast speed x2. Time measured from 2 meter mark to 8 meter mark to accommodate ramp-up and ramp-down.  With  RW: Normal speed 1: 26.3sec 0.38 m/s Normal speed 2: 32.03sec 0.75m/s  With SPC Normal speed 1: 52.1 sec  Pain in the L hip limits pt to once with SPC.   Cut off scores: <0.4 m/s = household Ambulator, 0.4-0.8 m/s = limited community Ambulator, >0.8 m/s = community Ambulator, >1.2 m/s = crossing a street, <1.0 = increased fall risk MCID 0.05 m/s (small), 0.13 m/s (moderate), 0.06 m/s (significant)  (ANPTA Core Set of Outcome Measures for Adults with Neurologic Conditions, 2018)  Pt performed 5 time sit<>stand (5xSTS): 30.8 sec (>15 sec indicates increased fall risk) heavy use of BUE support.    Patient demonstrates increased fall risk as noted by score of   29/56 on Berg Balance Scale.  (<36= high risk for falls, close to 100%; 37-45 significant >80%; 46-51 moderate >50%; 52-55 lower >25%)   PATIENT EDUCATION: Education details: Pt educated throughout session about proper posture and technique with exercises. Improved exercise technique, movement at target joints, use of target muscles after min to mod verbal, visual, tactile cues.   Person educated: Patient and Spouse Education method: Explanation Education comprehension: verbalized understanding  HOME EXERCISE PROGRAM: Access Code: [email or web address] URL: [email or web address] Date: 10/25/2022 Prepared by: Grier Rocher  Exercises - Seated Hip Abduction with Pelvic Floor Contraction and Resistance Loop  - 1 x daily - 7 x weekly - 3 sets - 10  reps - Sitting Knee Extension with Resistance  - 1 x daily - 7 x weekly - 3 sets - 10 reps - Standing Hip Flexor Stretch  - 1 x daily - 7 x weekly - 3 sets - 10 reps  GOALS: Goals reviewed with patient? Yes  SHORT TERM GOALS: Target date: 11/02/2022  Patient will be independent in home exercise program to improve strength/mobility for better functional independence with ADLs. Baseline: provided on 3/5 Goal status: progressing    LONG TERM GOALS: Target date: 12/28/2022  1.   Patient (> 50 years old) will complete five times sit to stand test in < 30 seconds indicating an increased LE strength and improved balance. Baseline: 2/14: 73.7 sec with significant UE assistance and not able to obtain full erect posture in standing. 3/19: 36.76 with UE to push from arm rest. 4/25: 30.8  Goal status: progressing   2.  Patient will increase FOTO score to equal to or greater than  47   to demonstrate statistically significant improvement in mobility and quality of life.  Baseline:2/14: 40 3/19: 50  4/25: 58  Goal status: MET    3.  Patient will increase Berg Balance score by > 6 points to demonstrate decreased fall risk during functional activities. Baseline: 33/56 . 4/25: 29/56 Goal status: progressing    4.  Patient will reduce timed up and go to <25 seconds with LRAD to reduce fall risk and demonstrate improved transfer/gait ability. Baseline: 2/14: 42.45 with SPC and UE assist with stand. 3/19: 55.2 sec with HDRW  Goal status: progressing   5.  Patient will increase 10 meter walk test to >1.9m/s as to improve gait speed for better community ambulation and to reduce fall risk. Baseline: 57.80 sec with bariatric walker 3/19: 0.22 m/s (44.31 sec) Goal status: progressing     ASSESSMENT:  CLINICAL IMPRESSION: Pt performed well throughout therapy session. Pain in the left hip limits use of SPC per pt request. Pt demonstrates improved perception of disability on FOTO. Improved function noted on 5x STS. Decreased Berg balance scale score on this day. Patient's condition has the potential to improve in response to therapy. Maximum improvement is yet to be obtained. The anticipated improvement is attainable and reasonable in a generally predictable time.  Pt will continue to benefit from skilled PT services to address gait, balance and strength deficits to reduce falls risk and optimize independence with mobility and ADL's.   OBJECTIVE IMPAIRMENTS: Abnormal gait, decreased  activity tolerance, decreased balance, decreased endurance, decreased knowledge of use of DME, decreased mobility, difficulty walking, decreased strength, decreased safety awareness, impaired perceived functional ability, and improper body mechanics.   ACTIVITY LIMITATIONS: bending, sitting, standing, squatting, stairs, transfers, bed mobility, bathing, toileting, and locomotion level  PARTICIPATION LIMITATIONS:  Pt reports no participation restrictions when prompted   PERSONAL FACTORS: 3+ comorbidities: HTN, depressed mood ( chart), T2DM  are also affecting patient's functional outcome.   REHAB POTENTIAL: Fair secondary to current functional level and progressive aspect of diagnosis   CLINICAL DECISION MAKING: Evolving/moderate complexity  EVALUATION COMPLEXITY: Moderate  PLAN:  PT FREQUENCY: 2x/week  PT DURATION: 12 weeks  PLANNED INTERVENTIONS: Therapeutic exercises, Therapeutic activity, Neuromuscular re-education, Balance training, Gait training, Patient/Family education, Self Care, Joint mobilization, Stair training, and Manual therapy   PLAN FOR NEXT SESSION:    BLE strengthening. Continue endurance and gait training for PD     Grier Rocher PT, DPT  Physical Therapist - [place]  [facility]  3:18 PM 12/13/22

## 2022-12-20 ENCOUNTER — Ambulatory Visit: Payer: 59 | Admitting: Physical Therapy

## 2022-12-22 ENCOUNTER — Ambulatory Visit: Payer: 59 | Admitting: Physical Therapy

## 2022-12-27 ENCOUNTER — Ambulatory Visit: Payer: 59 | Admitting: Physical Therapy

## 2022-12-29 ENCOUNTER — Ambulatory Visit: Payer: 59 | Admitting: Physical Therapy

## 2023-01-03 ENCOUNTER — Ambulatory Visit: Payer: 59 | Admitting: Physical Therapy

## 2023-01-05 ENCOUNTER — Ambulatory Visit: Payer: 59 | Attending: Neurology | Admitting: Physical Therapy

## 2023-01-05 DIAGNOSIS — R269 Unspecified abnormalities of gait and mobility: Secondary | ICD-10-CM | POA: Diagnosis present

## 2023-01-05 DIAGNOSIS — R296 Repeated falls: Secondary | ICD-10-CM | POA: Insufficient documentation

## 2023-01-05 DIAGNOSIS — R2689 Other abnormalities of gait and mobility: Secondary | ICD-10-CM

## 2023-01-05 DIAGNOSIS — R41841 Cognitive communication deficit: Secondary | ICD-10-CM | POA: Diagnosis present

## 2023-01-05 DIAGNOSIS — M6281 Muscle weakness (generalized): Secondary | ICD-10-CM | POA: Diagnosis present

## 2023-01-05 DIAGNOSIS — R262 Difficulty in walking, not elsewhere classified: Secondary | ICD-10-CM | POA: Diagnosis present

## 2023-01-05 DIAGNOSIS — R293 Abnormal posture: Secondary | ICD-10-CM | POA: Diagnosis present

## 2023-01-05 DIAGNOSIS — G20C Parkinsonism, unspecified: Secondary | ICD-10-CM | POA: Insufficient documentation

## 2023-01-05 DIAGNOSIS — R2681 Unsteadiness on feet: Secondary | ICD-10-CM | POA: Diagnosis present

## 2023-01-05 NOTE — Therapy (Signed)
OUTPATIENT PHYSICAL THERAPY NEURO TREATMENT/ RE-CERTIFICATION       Patient Name: Kirk Bennett. MRN: [id number] DOB:1951-03-23, 72 y.o., male Today's Date: 01/05/2023   PCP: Eustaquio Boyden, MD  REFERRING PROVIDER: Demetrio Lapping,  MD    END OF SESSION:  PT End of Session - 01/05/23 1354     Visit Number 21    Number of Visits 33    Date for PT Re-Evaluation 02/16/23    Progress Note Due on Visit 30    PT Start Time 1347    PT Stop Time 1430    PT Time Calculation (min) 43 min    Equipment Utilized During Treatment Gait belt    Activity Tolerance Other (comment)    Behavior During Therapy WFL for tasks assessed/performed                 Past Medical History:  Diagnosis Date   Cataracts, bilateral 02/2011   and suspected glaucoma, to return for f/u, no diabetic retinopathy   CKD stage 3 due to type 2 diabetes mellitus (HCC) 2015   normal renal US, self referred to Dr Cherylann Ratel   Complex partial seizures (HCC) 1990   from surgery for R temporal arachnoid cyst s/p drainage, possible continued sz so changed to lamotrigine (Dr. 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 Tedd Sias endo --> 02/2016 decided to return to PCP for DM care   Past Surgical History:  Procedure Laterality Date   CARDIAC CATHETERIZATION  02/19/2009   No blockages (Dr. Christene Slates)   COLONOSCOPY  09/16/2005   hyperplastic polyps, rpt due 10 yrs    COLONOSCOPY WITH PROPOFOL N/A 12/11/2015   mult polyps, few TA, rpt 41yrs ([place])   COLONOSCOPY WITH PROPOFOL N/A 09/02/2021   TAs, HPs, int hem, distal ileal polyposis -  benign, rpt yrs([place], Thomas Hoff, DO)   corrective surgery amblyopia  08/23/1955   Cystectomy or meningioma removal brain  08/22/1988   (unclear)   Left knee surgery  08/22/1969   Torn ACL   REPLACEMENT TOTAL KNEE  11/20/2009   Left ([facility] Dr. 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   Irregular heart beat 07/19/2021   BPH (benign prostatic hyperplasia) 10/03/2020   Pain due to onychomycosis of toenails of both feet 06/18/2020   CKD stage 3 due to type 2 diabetes mellitus (HCC) 10/23/2019   Type 2 diabetes mellitus with diabetic neuropathy, unspecified (HCC) 10/21/2019   Impaired gait and mobility 10/14/2019   Postural dizziness with presyncope 10/13/2019   Ventricular premature complexes 10/13/2019   Urge urinary incontinence 09/26/2019   Depressed mood 09/26/2019   Advanced care planning/counseling discussion 09/22/2018   Weakness of both lower extremities 09/22/2018   History of hepatitis 01/05/2018   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   NAFLD (nonalcoholic fatty liver disease) 32/44/0102   Obesity, Class I, BMI 30.0-34.9 (see actual BMI) 09/18/2017   Polyarthralgia 02/15/2017   OSA (obstructive sleep apnea) 02/15/2017  Partial epilepsy with impairment of consciousness (HCC) 01/29/2016   Low vitamin B12 level 05/27/2015   Elevated PSA 05/27/2015   Vitamin D deficiency 03/02/2015   Other long term (current) drug therapy 03/12/2012   Encounter for general adult medical examination with abnormal findings 04/26/2011   TOTAL KNEE REPLACEMENT, LEFT, HX OF 04/07/2010   Type 2 diabetes mellitus with diabetic nephropathy (HCC) 03/30/2010   Hyperlipidemia associated with type 2 diabetes mellitus (HCC) 03/30/2010   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) 03/30/2010   Essential hypertension, benign 03/30/2010    ONSET DATE:  05/2021  REFERRING DIAG: G20.C (ICD-10-CM) - Parkinsonism, unspecified   THERAPY DIAG:  Abnormality of gait and mobility  Repeated falls  Difficulty in walking, not elsewhere classified  Unsteadiness on feet  Other abnormalities of gait and mobility  Muscle weakness (generalized)  Rationale for Evaluation and Treatment: Rehabilitation  SUBJECTIVE:                                                                                                                                                                                             SUBJECTIVE STATEMENT:   Pt will fall on 5/14. EMS called to assist pt off floor. No injury and follow up with MD. Pt states that he has no pain, does not know what caused fall.  Pt has missed 2 weeks of therapy from caregiver being sick.    Pt accompanied by: self and significant other  PERTINENT HISTORY: Diabetic neuropathy, atypical PD which MD thinks could be PSP, pt reports diagnosis is parkinson's disease.    PAIN:  Are you having pain? No  PRECAUTIONS: Fall  WEIGHT BEARING RESTRICTIONS: No  FALLS: Has patient fallen in last 6 months? Yes. Number of falls 1 fall when he slippe dand fell getting out of the shower.   LIVING ENVIRONMENT: Lives with: lives with their family and lives with their spouse Lives in: House/apartment Stairs: Yes: Internal: 13, 2 sets steps; on right going up and External: 5 steps; on right going up Has following equipment at home: Single point cane, Walker - 2 wheeled, shower chair, and elevated commode  PLOF: Independent with basic ADLs, Independent with household mobility with device, Independent with community mobility with device, and Needs assistance with homemaking  PATIENT GOALS: Lower and upper body strength as well as balance   OBJECTIVE:   DIAGNOSTIC FINDINGS: No significant findings   COGNITION: Overall cognitive status:  A little flat but otherwise WNL    SENSATION: Not  tested  COORDINATION: Not tested   EDEMA:  None   MUSCLE TONE: Not tested, if in supine may  be good to assess if pt has rigidity    POSTURE: rounded shoulders, forward head, and increased thoracic kyphosis  LOWER EXTREMITY ROM:   WNL in seated, in standing pt unable to obtain full erect posture.    LOWER EXTREMITY MMT:    MMT Right Eval Left Eval  Hip flexion 4 4-  Hip extension    Hip abduction 4+ 4+  Hip adduction 4 4  Hip internal rotation    Hip external rotation    Knee flexion 4+ 4+  Knee extension 4+ 4+  Ankle dorsiflexion 4+ 4+  Ankle plantarflexion 5 5  Ankle inversion    Ankle eversion    (Blank rows = not tested) All tested in seated position   BED MOBILITY:  Not tested at eval   TRANSFERS: Assistive device utilized:  UE and cane    Sit to stand: CGA Stand to sit: CGA Chair to chair: CGA Floor:  Not indicated for testing     CURB: Not tested   STAIRS: Not tested at eval, to look at in future assessments    GAIT: Gait pattern: step through pattern, decreased stride length, knee flexed in stance- Right, knee flexed in stance- Left, shuffling, and trunk flexed Distance walked: 30 feet  Assistive device utilized: Single point cane Level of assistance: CGA Comments: uses walls and other ibjects in clinic for steadying   FUNCTIONAL TESTS:  5 times sit to stand:  2/14:73.7 sec with significant UE assist and unable to stand in full erect posture. 3/19: 36.76 with UEpushing from arm rests  Timed up and go (TUG): 2/14: 42.45 sec with SPC and with UE assist on STS transfer. 3/19 55.2 sec with RW and UE assist for chair transfer. 10 meter walk test: 2/14: 57.80 with bariatric walker. 3/19: 44.31sec  Berg Balance Scale: 33/56   PATIENT SURVEYS:  FOTO 2/14: 40. 3/19: 50 4/25: 59    TODAY'S TREATMENT: DATE:01/05/2023  Nustep.   Gait with RW x 10 ft to parallel bars with min assist and shuffle gait pattern. Additional gait with RW x 38ft without  resistance and x 68ft with 3# ankle weights on BLE. CGA and significantly improved step length following reciprocal training in parallel bars.   Dynamic balance, coordination training in parallel bars.  Stepping over 1 cane on floor forward/reverse 2 x 8 bil  Lateral step over and back 1 cane x 8 bil  Gait over 4 canes in floor forward reverse x 3. Pt able to perform reciprocal pattern forward, but required step to in reverse with cues for alternating pattern  Seated therex:  3# ankle weight.  LAQ x 10  Hip abduction over bolster. X 10  Isometric hip adduction  Sit<>stand x 4 with UE to push from arm rest.  Cues for full ROM with visual feedback from PT.    PATIENT EDUCATION: Education details: Pt educated throughout session about proper posture and technique with exercises. Improved exercise technique, movement at target joints, use of target muscles after min to mod verbal, visual, tactile cues.   Person educated: Patient and Spouse Education method: Explanation Education comprehension: verbalized understanding  HOME EXERCISE PROGRAM: Access Code: [email or web address] URL: [email or web address] Date: 10/25/2022 Prepared by: Grier Rocher  Exercises - Seated Hip Abduction with Pelvic Floor Contraction and Resistance Loop  - 1 x daily - 7 x weekly - 3 sets - 10 reps - Sitting Knee Extension with Resistance  - 1 x daily - 7 x weekly - 3 sets - 10 reps -  Standing Hip Flexor Stretch  - 1 x daily - 7 x weekly - 3 sets - 10 reps  GOALS: Goals reviewed with patient? Yes  SHORT TERM GOALS: Target date: 11/02/2022  Patient will be independent in home exercise program to improve strength/mobility for better functional independence with ADLs. Baseline: provided on 3/5 Goal status: progressing    LONG TERM GOALS: Target date: 12/28/2022  1.  Patient (> 88 years old) will complete five times sit to stand test in < 30 seconds indicating an increased LE strength and improved  balance. Baseline: 2/14: 73.7 sec with significant UE assistance and not able to obtain full erect posture in standing. 3/19: 36.76 with UE to push from arm rest. 4/25: 30.8  Goal status: progressing   2.  Patient will increase FOTO score to equal to or greater than  47   to demonstrate statistically significant improvement in mobility and quality of life.  Baseline:2/14: 40 3/19: 50  4/25: 58  Goal status: MET    3.  Patient will increase Berg Balance score by > 6 points to demonstrate decreased fall risk during functional activities. Baseline: 33/56 . 4/25: 29/56 Goal status: progressing    4.  Patient will reduce timed up and go to <25 seconds with LRAD to reduce fall risk and demonstrate improved transfer/gait ability. Baseline: 2/14: 42.45 with SPC and UE assist with stand. 3/19: 55.2 sec with HDRW  Goal status: progressing   5.  Patient will increase 10 meter walk test to >1.16m/s as to improve gait speed for better community ambulation and to reduce fall risk. Baseline: 57.80 sec with bariatric walker 3/19: 0.22 m/s (44.31 sec) Goal status: progressing     ASSESSMENT:  CLINICAL IMPRESSION: Pt performed well throughout therapy session. Pt's progress towards goals assessed at last PT treatment: see above. Pt demonstrated increased shuffle on this day, but improved with use of visual targets to improve step length on BLE. Noted to have increase ROM for all therex with use of visual feedback from PT.   Pt will continue to benefit from skilled PT services to address gait, balance and strength deficits to reduce falls risk and optimize independence with mobility and ADL's.   OBJECTIVE IMPAIRMENTS: Abnormal gait, decreased activity tolerance, decreased balance, decreased endurance, decreased knowledge of use of DME, decreased mobility, difficulty walking, decreased strength, decreased safety awareness, impaired perceived functional ability, and improper body mechanics.   ACTIVITY  LIMITATIONS: bending, sitting, standing, squatting, stairs, transfers, bed mobility, bathing, toileting, and locomotion level  PARTICIPATION LIMITATIONS:  Pt reports no participation restrictions when prompted   PERSONAL FACTORS: 3+ comorbidities: HTN, depressed mood ( chart), T2DM  are also affecting patient's functional outcome.   REHAB POTENTIAL: Fair secondary to current functional level and progressive aspect of diagnosis   CLINICAL DECISION MAKING: Evolving/moderate complexity  EVALUATION COMPLEXITY: Moderate  PLAN:  PT FREQUENCY: 2x/week  PT DURATION: 12 weeks  PLANNED INTERVENTIONS: Therapeutic exercises, Therapeutic activity, Neuromuscular re-education, Balance training, Gait training, Patient/Family education, Self Care, Joint mobilization, Stair training, and Manual therapy   PLAN FOR NEXT SESSION:    BLE strengthening. Continue endurance and gait training for PD     Grier Rocher PT, DPT  Physical Therapist - [facility]  [facility]  2:43 PM 01/05/23

## 2023-01-10 ENCOUNTER — Ambulatory Visit: Payer: 59 | Admitting: Physical Therapy

## 2023-01-10 DIAGNOSIS — R262 Difficulty in walking, not elsewhere classified: Secondary | ICD-10-CM

## 2023-01-10 DIAGNOSIS — R2681 Unsteadiness on feet: Secondary | ICD-10-CM

## 2023-01-10 DIAGNOSIS — R269 Unspecified abnormalities of gait and mobility: Secondary | ICD-10-CM | POA: Diagnosis not present

## 2023-01-10 DIAGNOSIS — R2689 Other abnormalities of gait and mobility: Secondary | ICD-10-CM

## 2023-01-10 DIAGNOSIS — M6281 Muscle weakness (generalized): Secondary | ICD-10-CM

## 2023-01-10 DIAGNOSIS — R296 Repeated falls: Secondary | ICD-10-CM

## 2023-01-10 DIAGNOSIS — R293 Abnormal posture: Secondary | ICD-10-CM

## 2023-01-10 NOTE — Therapy (Signed)
OUTPATIENT PHYSICAL THERAPY NEURO TREATMENT/ RE-CERTIFICATION       Patient Name: Kirk Bennett. MRN: [id number] DOB:[birth year], 72 y.o., male Today's Date: 01/10/2023   PCP: Eustaquio Boyden, MD  REFERRING PROVIDER: Demetrio Lapping,  MD    END OF SESSION:  PT End of Session - 01/10/23 1409     Visit Number 22    Number of Visits 33    Date for PT Re-Evaluation 02/16/23    Progress Note Due on Visit 30    PT Start Time 1349    PT Stop Time 1433    PT Time Calculation (min) 44 min    Equipment Utilized During Treatment Gait belt    Activity Tolerance Other (comment)    Behavior During Therapy WFL for tasks assessed/performed                 Past Medical History:  Diagnosis Date   Cataracts, bilateral 02/2011   and suspected glaucoma, to return for f/u, no diabetic retinopathy   CKD stage 3 due to type 2 diabetes mellitus (HCC) 2015   normal renal US, self referred to Dr Cherylann Ratel   Complex partial seizures (HCC) 1990   from surgery for R temporal arachnoid cyst s/p drainage, possible continued sz so changed to lamotrigine (Dr. 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 Tedd Sias endo --> 02/2016 decided to return to PCP for DM care   Past Surgical History:  Procedure Laterality Date   CARDIAC CATHETERIZATION  02/19/2009   No blockages (Dr. Christene Slates)   COLONOSCOPY  09/16/2005   hyperplastic polyps, rpt due 10 yrs    COLONOSCOPY WITH PROPOFOL N/A 12/11/2015   mult polyps, few TA, rpt 79yrs ([place])   COLONOSCOPY WITH PROPOFOL N/A 09/02/2021   TAs, HPs, int hem, distal ileal polyposis -  benign, rpt yrs([place], Thomas Hoff, DO)   corrective surgery amblyopia  08/23/1955   Cystectomy or meningioma removal brain  08/22/1988   (unclear)   Left knee surgery  08/22/1969   Torn ACL   REPLACEMENT TOTAL KNEE  11/20/2009   Left ([facility] Dr. 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   Irregular heart beat 07/19/2021   BPH (benign prostatic hyperplasia) 10/03/2020   Pain due to onychomycosis of toenails of both feet 06/18/2020   CKD stage 3 due to type 2 diabetes mellitus (HCC) 10/23/2019   Type 2 diabetes mellitus with diabetic neuropathy, unspecified (HCC) 10/21/2019   Impaired gait and mobility 10/14/2019   Postural dizziness with presyncope 10/13/2019   Ventricular premature complexes 10/13/2019   Urge urinary incontinence 09/26/2019   Depressed mood 09/26/2019   Advanced care planning/counseling discussion 09/22/2018   Weakness of both lower extremities 09/22/2018   History of hepatitis 01/05/2018   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   NAFLD (nonalcoholic fatty liver disease) 78/29/5621   Obesity, Class I, BMI 30.0-34.9 (see actual BMI) 09/18/2017   Polyarthralgia 02/15/2017   OSA (obstructive sleep apnea) 02/15/2017  Partial epilepsy with impairment of consciousness (HCC) 01/29/2016   Low vitamin B12 level 05/27/2015   Elevated PSA 05/27/2015   Vitamin D deficiency 03/02/2015   Other long term (current) drug therapy 03/12/2012   Encounter for general adult medical examination with abnormal findings 04/26/2011   TOTAL KNEE REPLACEMENT, LEFT, HX OF 04/07/2010   Type 2 diabetes mellitus with diabetic nephropathy (HCC) 03/30/2010   Hyperlipidemia associated with type 2 diabetes mellitus (HCC) 03/30/2010   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) 03/30/2010   Essential hypertension, benign 03/30/2010    ONSET DATE:  05/2021  REFERRING DIAG: G20.C (ICD-10-CM) - Parkinsonism, unspecified   THERAPY DIAG:  Abnormality of gait and mobility  Repeated falls  Difficulty in walking, not elsewhere classified  Unsteadiness on feet  Other abnormalities of gait and mobility  Muscle weakness (generalized)  Abnormal posture  Rationale for Evaluation and Treatment: Rehabilitation  SUBJECTIVE:                                                                                                                                                                                             SUBJECTIVE STATEMENT:   Noted to have cut on L knee, wife believe it is from fall on 5/14. No pain noted or other updates.    Pt accompanied by: self and significant other  PERTINENT HISTORY: Diabetic neuropathy, atypical PD which MD thinks could be PSP, pt reports diagnosis is parkinson's disease.    PAIN:  Are you having pain? No  PRECAUTIONS: Fall  WEIGHT BEARING RESTRICTIONS: No  FALLS: Has patient fallen in last 6 months? Yes. Number of falls 1 fall when he slippe dand fell getting out of the shower.   LIVING ENVIRONMENT: Lives with: lives with their family and lives with their spouse Lives in: House/apartment Stairs: Yes: Internal: 13, 2 sets steps; on right going up and External: 5 steps; on right going up Has following equipment at home: Single point cane, Walker - 2 wheeled, shower chair, and elevated commode  PLOF: Independent with basic ADLs, Independent with household mobility with device, Independent with community mobility with device, and Needs assistance with homemaking  PATIENT GOALS: Lower and upper body strength as well as balance   OBJECTIVE:   DIAGNOSTIC FINDINGS: No significant findings   COGNITION: Overall cognitive status:  A little flat but otherwise WNL    SENSATION: Not tested  COORDINATION: Not tested   EDEMA:  None   MUSCLE TONE: Not tested, if in supine may be good to assess  if pt has rigidity    POSTURE: rounded shoulders, forward head, and increased thoracic kyphosis  LOWER EXTREMITY ROM:   WNL in seated, in standing pt unable to obtain full erect posture.    LOWER EXTREMITY MMT:    MMT Right Eval Left Eval  Hip flexion 4 4-  Hip extension    Hip abduction 4+ 4+  Hip adduction 4 4  Hip internal rotation    Hip external rotation    Knee flexion 4+ 4+  Knee extension 4+ 4+  Ankle dorsiflexion 4+ 4+  Ankle plantarflexion 5 5  Ankle inversion    Ankle eversion    (Blank rows = not tested) All tested in seated position   BED MOBILITY:  Not tested at eval   TRANSFERS: Assistive device utilized:  UE and cane    Sit to stand: CGA Stand to sit: CGA Chair to chair: CGA Floor:  Not indicated for testing     CURB: Not tested   STAIRS: Not tested at eval, to look at in future assessments    GAIT: Gait pattern: step through pattern, decreased stride length, knee flexed in stance- Right, knee flexed in stance- Left, shuffling, and trunk flexed Distance walked: 30 feet  Assistive device utilized: Single point cane Level of assistance: CGA Comments: uses walls and other ibjects in clinic for steadying   FUNCTIONAL TESTS:  5 times sit to stand:  2/14:73.7 sec with significant UE assist and unable to stand in full erect posture. 3/19: 36.76 with UEpushing from arm rests  Timed up and go (TUG): 2/14: 42.45 sec with SPC and with UE assist on STS transfer. 3/19 55.2 sec with RW and UE assist for chair transfer. 10 meter walk test: 2/14: 57.80 with bariatric walker. 3/19: 44.31sec  Berg Balance Scale: 33/56   PATIENT SURVEYS:  FOTO 2/14: 40. 3/19: 50 4/25: 59    TODAY'S TREATMENT: DATE:01/10/2023  Nustep level 2 x 3 min level 3 x 2 min cues for terminal knee extension on BLE .   Gait with RW x 12 ft to mat tablewith  supervision assist from PT. assist and shuffle gait pattern. Variable to gait training forward/reverse 38ft x 3 with  supervision assist from PT with cues for step length in retroversion.  Figure 8 x 2  with RW around 2 steps in floor, supervision assist from PT with min-mod cues for AD management and step length in turns.  Gait with RW x 123ft with supervision assist and cues for step length initially and in turns through doorways.  Reciprocal foot tap on 6inch step 2 x 8 bil with cues for posture and stance width between steps.    Therapeutic rest breaks between bouts due to BLE fatigue per pt report.   PATIENT EDUCATION: Education details: Pt educated throughout session about proper posture and technique with exercises. Improved exercise technique, movement at target joints, use of target muscles after min to mod verbal, visual, tactile cues.   Person educated: Patient and Spouse Education method: Explanation Education comprehension: verbalized understanding  HOME EXERCISE PROGRAM: Access Code: [email or web address] URL: [email or web address] Date: 10/25/2022 Prepared by: Grier Rocher  Exercises - Seated Hip Abduction with Pelvic Floor Contraction and Resistance Loop  - 1 x daily - 7 x weekly - 3 sets - 10 reps - Sitting Knee Extension with Resistance  - 1 x daily - 7 x weekly - 3 sets - 10 reps - Standing Hip Flexor Stretch  - 1 x daily - 7 x weekly - 3 sets - 10 reps  GOALS: Goals reviewed with patient? Yes  SHORT TERM GOALS:  Target date: 11/02/2022  Patient will be independent in home exercise program to improve strength/mobility for better functional independence with ADLs. Baseline: provided on 3/5 Goal status: progressing    LONG TERM GOALS: Target date: 12/28/2022  1.  Patient (> 54 years old) will complete five times sit to stand test in < 30 seconds indicating an increased LE strength and improved balance. Baseline: 2/14: 73.7 sec with significant UE assistance and not able to obtain full erect posture in standing. 3/19: 36.76 with UE to push from arm rest. 4/25: 30.8  Goal status:  progressing   2.  Patient will increase FOTO score to equal to or greater than  47   to demonstrate statistically significant improvement in mobility and quality of life.  Baseline:2/14: 40 3/19: 50  4/25: 58  Goal status: MET    3.  Patient will increase Berg Balance score by > 6 points to demonstrate decreased fall risk during functional activities. Baseline: 33/56 . 4/25: 29/56 Goal status: progressing    4.  Patient will reduce timed up and go to <25 seconds with LRAD to reduce fall risk and demonstrate improved transfer/gait ability. Baseline: 2/14: 42.45 with SPC and UE assist with stand. 3/19: 55.2 sec with HDRW  Goal status: progressing   5.  Patient will increase 10 meter walk test to >1.53m/s as to improve gait speed for better community ambulation and to reduce fall risk. Baseline: 57.80 sec with bariatric walker 3/19: 0.22 m/s (44.31 sec) Goal status: progressing     ASSESSMENT:  CLINICAL IMPRESSION: Pt performed well throughout therapy session. PT treatment focused on variable gait training to force improved step and, AD management and posture. Noted to have increased step length on this day compared to prior session when not distracted.     Pt will continue to benefit from skilled PT services to address gait, balance and strength deficits to reduce falls risk and optimize independence with mobility and ADL's.   OBJECTIVE IMPAIRMENTS: Abnormal gait, decreased activity tolerance, decreased balance, decreased endurance, decreased knowledge of use of DME, decreased mobility, difficulty walking, decreased strength, decreased safety awareness, impaired perceived functional ability, and improper body mechanics.   ACTIVITY LIMITATIONS: bending, sitting, standing, squatting, stairs, transfers, bed mobility, bathing, toileting, and locomotion level  PARTICIPATION LIMITATIONS:  Pt reports no participation restrictions when prompted   PERSONAL FACTORS: 3+ comorbidities: HTN,  depressed mood ( chart), T2DM  are also affecting patient's functional outcome.   REHAB POTENTIAL: Fair secondary to current functional level and progressive aspect of diagnosis   CLINICAL DECISION MAKING: Evolving/moderate complexity  EVALUATION COMPLEXITY: Moderate  PLAN:  PT FREQUENCY: 2x/week  PT DURATION: 12 weeks  PLANNED INTERVENTIONS: Therapeutic exercises, Therapeutic activity, Neuromuscular re-education, Balance training, Gait training, Patient/Family education, Self Care, Joint mobilization, Stair training, and Manual therapy   PLAN FOR NEXT SESSION:    BLE strengthening.  Continue endurance and gait training for PD     Grier Rocher PT, DPT  Physical Therapist - [facility]  [facility]  3:02 PM 01/10/23

## 2023-01-12 ENCOUNTER — Ambulatory Visit: Payer: 59 | Admitting: Physical Therapy

## 2023-01-12 DIAGNOSIS — R296 Repeated falls: Secondary | ICD-10-CM

## 2023-01-12 DIAGNOSIS — R2681 Unsteadiness on feet: Secondary | ICD-10-CM

## 2023-01-12 DIAGNOSIS — R269 Unspecified abnormalities of gait and mobility: Secondary | ICD-10-CM | POA: Diagnosis not present

## 2023-01-12 DIAGNOSIS — R2689 Other abnormalities of gait and mobility: Secondary | ICD-10-CM

## 2023-01-12 DIAGNOSIS — R293 Abnormal posture: Secondary | ICD-10-CM

## 2023-01-12 DIAGNOSIS — M6281 Muscle weakness (generalized): Secondary | ICD-10-CM

## 2023-01-12 NOTE — Therapy (Signed)
OUTPATIENT PHYSICAL THERAPY NEURO TREATMENT      Patient Name: Dessie Coma. MRN: [id number] DOB:1950-11-07, 72 y.o., male Today's Date: 01/12/2023   PCP: Eustaquio Boyden, MD  REFERRING PROVIDER: Demetrio Lapping,  MD    END OF SESSION:  PT End of Session - 01/12/23 1344     Visit Number 23    Number of Visits 33    Date for PT Re-Evaluation 02/16/23    Progress Note Due on Visit 30    PT Start Time 1345    PT Stop Time 1428    PT Time Calculation (min) 43 min    Equipment Utilized During Treatment Gait belt    Activity Tolerance Other (comment)    Behavior During Therapy WFL for tasks assessed/performed                 Past Medical History:  Diagnosis Date   Cataracts, bilateral 02/2011   and suspected glaucoma, to return for f/u, no diabetic retinopathy   CKD stage 3 due to type 2 diabetes mellitus (HCC) 2015   normal renal US, self referred to Dr Cherylann Ratel   Complex partial seizures (HCC) 1990   from surgery for R temporal arachnoid cyst s/p drainage, possible continued sz so changed to lamotrigine (Dr. 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 Tedd Sias endo --> 02/2016 decided to return to PCP for DM care   Past Surgical History:  Procedure Laterality Date   CARDIAC CATHETERIZATION  02/19/2009   No blockages (Dr. Christene Slates)   COLONOSCOPY  09/16/2005   hyperplastic polyps, rpt due 10 yrs    COLONOSCOPY WITH PROPOFOL N/A 12/11/2015   mult polyps, few TA, rpt 1yrs ([place])   COLONOSCOPY WITH PROPOFOL N/A 09/02/2021   TAs, HPs, int hem, distal ileal polyposis - benign, rpt yrs([place],  Thomas Hoff, DO)   corrective surgery amblyopia  08/23/1955   Cystectomy or meningioma removal brain  08/22/1988   (unclear)   Left knee surgery  08/22/1969   Torn ACL   REPLACEMENT TOTAL KNEE  11/20/2009   Left ([facility] Dr. 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   Irregular heart beat 07/19/2021   BPH (benign prostatic hyperplasia) 10/03/2020   Pain due to onychomycosis of toenails of both feet 06/18/2020   CKD stage 3 due to type 2 diabetes mellitus (HCC) 10/23/2019   Type 2 diabetes mellitus with diabetic neuropathy, unspecified (HCC) 10/21/2019   Impaired gait and mobility 10/14/2019   Postural dizziness with presyncope 10/13/2019   Ventricular premature complexes 10/13/2019   Urge urinary incontinence 09/26/2019   Depressed mood 09/26/2019   Advanced care planning/counseling discussion 09/22/2018   Weakness of both lower extremities 09/22/2018   History of hepatitis 01/05/2018   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   NAFLD (nonalcoholic fatty liver disease) 96/29/5284   Obesity, Class I, BMI 30.0-34.9 (see actual BMI) 09/18/2017   Polyarthralgia 02/15/2017   OSA (obstructive sleep apnea) 02/15/2017   Partial  epilepsy with impairment of consciousness (HCC) 01/29/2016   Low vitamin B12 level 05/27/2015   Elevated PSA 05/27/2015   Vitamin D deficiency 03/02/2015   Other long term (current) drug therapy 03/12/2012   Encounter for general adult medical examination with abnormal findings 04/26/2011   TOTAL KNEE REPLACEMENT, LEFT, HX OF 04/07/2010   Type 2 diabetes mellitus with diabetic nephropathy (HCC) 03/30/2010   Hyperlipidemia associated with type 2 diabetes mellitus (HCC) 03/30/2010   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) 03/30/2010   Essential hypertension, benign 03/30/2010    ONSET DATE: 05/2021  REFERRING DIAG: G20.C  (ICD-10-CM) - Parkinsonism, unspecified   THERAPY DIAG:  Abnormality of gait and mobility  Repeated falls  Other abnormalities of gait and mobility  Unsteadiness on feet  Muscle weakness (generalized)  Abnormal posture  Rationale for Evaluation and Treatment: Rehabilitation  SUBJECTIVE:                                                                                                                                                                                             SUBJECTIVE STATEMENT:   Noted to have cut on L knee, wife believe it is from fall on 5/14. No pain noted or other updates.    Pt accompanied by: self and significant other  PERTINENT HISTORY: Diabetic neuropathy, atypical PD which MD thinks could be PSP, pt reports diagnosis is parkinson's disease.    PAIN:  Are you having pain? No  PRECAUTIONS: Fall  WEIGHT BEARING RESTRICTIONS: No  FALLS: Has patient fallen in last 6 months? Yes. Number of falls 1 fall when he slippe dand fell getting out of the shower.   LIVING ENVIRONMENT: Lives with: lives with their family and lives with their spouse Lives in: House/apartment Stairs: Yes: Internal: 13, 2 sets steps; on right going up and External: 5 steps; on right going up Has following equipment at home: Single point cane, Walker - 2 wheeled, shower chair, and elevated commode  PLOF: Independent with basic ADLs, Independent with household mobility with device, Independent with community mobility with device, and Needs assistance with homemaking  PATIENT GOALS: Lower and upper body strength as well as balance   OBJECTIVE:   DIAGNOSTIC FINDINGS: No significant findings   COGNITION: Overall cognitive status:  A little flat but otherwise WNL    SENSATION: Not tested  COORDINATION: Not tested   EDEMA:  None   MUSCLE TONE: Not tested, if in supine may be good to assess if pt has rigidity    POSTURE: rounded shoulders, forward head, and increased  thoracic kyphosis  LOWER EXTREMITY ROM:   WNL in  seated, in standing pt unable to obtain full erect posture.    LOWER EXTREMITY MMT:    MMT Right Eval Left Eval  Hip flexion 4 4-  Hip extension    Hip abduction 4+ 4+  Hip adduction 4 4  Hip internal rotation    Hip external rotation    Knee flexion 4+ 4+  Knee extension 4+ 4+  Ankle dorsiflexion 4+ 4+  Ankle plantarflexion 5 5  Ankle inversion    Ankle eversion    (Blank rows = not tested) All tested in seated position   BED MOBILITY:  Not tested at eval   TRANSFERS: Assistive device utilized:  UE and cane    Sit to stand: CGA Stand to sit: CGA Chair to chair: CGA Floor:  Not indicated for testing     CURB: Not tested   STAIRS: Not tested at eval, to look at in future assessments    GAIT: Gait pattern: step through pattern, decreased stride length, knee flexed in stance- Right, knee flexed in stance- Left, shuffling, and trunk flexed Distance walked: 30 feet  Assistive device utilized: Single point cane Level of assistance: CGA Comments: uses walls and other ibjects in clinic for steadying   FUNCTIONAL TESTS:  5 times sit to stand:  2/14:73.7 sec with significant UE assist and unable to stand in full erect posture. 3/19: 36.76 with UEpushing from arm rests  Timed up and go (TUG): 2/14: 42.45 sec with SPC and with UE assist on STS transfer. 3/19 55.2 sec with RW and UE assist for chair transfer. 10 meter walk test: 2/14: 57.80 with bariatric walker. 3/19: 44.31sec  Berg Balance Scale: 33/56   PATIENT SURVEYS:  FOTO 2/14: 40. 3/19: 50 4/25: 59    TODAY'S TREATMENT: DATE:01/12/2023 Seated therex with 4# ankle weight:  LAQx 12 Hip flexionx 12 Heel slide x10 AAROM on the LLE to improve ROM and hold of 10 sec on last rep. Ankle DF/PF x 15 Constant visual and verbal instruction for improved hold at end range and decreased   Standing step over cane in floor x 10 bil with UE supported on RW  Sit<>stand 2x 5  with UE pushing from chair with airex pad in seat. Additional bout x 2 without airex pad in seat. Heavy reliance on UE to push to standing.    Gait with 4# ankle weight and RW 2 x 13ft.  Additional gait without resistance, but UE supported on RW x 138ft. With intermittent instruction for increased step length "over cane" as performed prior to gait training; with  significant improvement in step length following each instruction.   Seated Parkinson trunkal extension/rotation  Open the book x 10 bil  Modified windmill x 5 Bil  Foor touch/horizontal abduction  x 5  Cues for full shoulder ROM and trunk extension/rotation as needed for exercise.     PATIENT EDUCATION: Education details: Pt educated throughout session about proper posture and technique with exercises. Improved exercise technique, movement at target joints, use of target muscles after min to mod verbal, visual, tactile cues.   Person educated: Patient and Spouse Education method: Explanation Education comprehension: verbalized understanding  HOME EXERCISE PROGRAM: Access Code: [email or web address] URL: [email or web address] Date: 10/25/2022 Prepared by: Grier Rocher  Exercises - Seated Hip Abduction with Pelvic Floor Contraction and Resistance Loop  - 1 x daily - 7 x weekly - 3 sets - 10 reps - Sitting Knee Extension with Resistance  - 1 x daily - 7 x weekly - 3 sets -  10 reps - Standing Hip Flexor Stretch  - 1 x daily - 7 x weekly - 3 sets - 10 reps  GOALS: Goals reviewed with patient? Yes  SHORT TERM GOALS: Target date: 11/02/2022  Patient will be independent in home exercise program to improve strength/mobility for better functional independence with ADLs. Baseline: provided on 3/5 Goal status: progressing    LONG TERM GOALS: Target date: 12/28/2022  1.  Patient (> 57 years old) will complete five times sit to stand test in < 30 seconds indicating an increased LE strength and improved balance. Baseline: 2/14:  73.7 sec with significant UE assistance and not able to obtain full erect posture in standing. 3/19: 36.76 with UE to push from arm rest. 4/25: 30.8  Goal status: progressing   2.  Patient will increase FOTO score to equal to or greater than  47   to demonstrate statistically significant improvement in mobility and quality of life.  Baseline:2/14: 40 3/19: 50  4/25: 58  Goal status: MET    3.  Patient will increase Berg Balance score by > 6 points to demonstrate decreased fall risk during functional activities. Baseline: 33/56 . 4/25: 29/56 Goal status: progressing    4.  Patient will reduce timed up and go to <25 seconds with LRAD to reduce fall risk and demonstrate improved transfer/gait ability. Baseline: 2/14: 42.45 with SPC and UE assist with stand. 3/19: 55.2 sec with HDRW  Goal status: progressing   5.  Patient will increase 10 meter walk test to >1.23m/s as to improve gait speed for better community ambulation and to reduce fall risk. Baseline: 57.80 sec with bariatric walker 3/19: 0.22 m/s (44.31 sec) Goal status: progressing     ASSESSMENT:  CLINICAL IMPRESSION: Pt performed well throughout therapy session. PT treatment focused on BLE strength and functional gait training. Pt able to demonstrate improved step length, but constant cues required from PT for symmetry and attention to gait pattern in distracting environment. Pt instructed in seated trunkal extension and rotational exercises with good effort, but constant visual cues for full ROM.  Pt will continue to benefit from skilled PT services to address gait, balance and strength deficits to reduce falls risk and optimize independence with mobility and ADL's.   OBJECTIVE IMPAIRMENTS: Abnormal gait, decreased activity tolerance, decreased balance, decreased endurance, decreased knowledge of use of DME, decreased mobility, difficulty walking, decreased strength, decreased safety awareness, impaired perceived functional ability,  and improper body mechanics.   ACTIVITY LIMITATIONS: bending, sitting, standing, squatting, stairs, transfers, bed mobility, bathing, toileting, and locomotion level  PARTICIPATION LIMITATIONS:  Pt reports no participation restrictions when prompted   PERSONAL FACTORS: 3+ comorbidities: HTN, depressed mood ( chart), T2DM  are also affecting patient's functional outcome.   REHAB POTENTIAL: Fair secondary to current functional level and progressive aspect of diagnosis   CLINICAL DECISION MAKING: Evolving/moderate complexity  EVALUATION COMPLEXITY: Moderate  PLAN:  PT FREQUENCY: 2x/week  PT DURATION: 12 weeks  PLANNED INTERVENTIONS: Therapeutic exercises, Therapeutic activity, Neuromuscular re-education, Balance training, Gait training, Patient/Family education, Self Care, Joint mobilization, Stair training, and Manual therapy   PLAN FOR NEXT SESSION:    BLE strengthening.  Continue endurance and gait training for PD     Grier Rocher PT, DPT  Physical Therapist - [facility]  [facility]  2:52 PM 01/12/23

## 2023-01-17 ENCOUNTER — Ambulatory Visit: Payer: 59 | Admitting: Physical Therapy

## 2023-01-17 DIAGNOSIS — R269 Unspecified abnormalities of gait and mobility: Secondary | ICD-10-CM | POA: Diagnosis not present

## 2023-01-17 DIAGNOSIS — R41841 Cognitive communication deficit: Secondary | ICD-10-CM

## 2023-01-17 DIAGNOSIS — M6281 Muscle weakness (generalized): Secondary | ICD-10-CM

## 2023-01-17 DIAGNOSIS — R296 Repeated falls: Secondary | ICD-10-CM

## 2023-01-17 DIAGNOSIS — R293 Abnormal posture: Secondary | ICD-10-CM

## 2023-01-17 DIAGNOSIS — R2689 Other abnormalities of gait and mobility: Secondary | ICD-10-CM

## 2023-01-17 DIAGNOSIS — R262 Difficulty in walking, not elsewhere classified: Secondary | ICD-10-CM

## 2023-01-17 DIAGNOSIS — G20C Parkinsonism, unspecified: Secondary | ICD-10-CM

## 2023-01-17 DIAGNOSIS — R2681 Unsteadiness on feet: Secondary | ICD-10-CM

## 2023-01-17 NOTE — Therapy (Signed)
OUTPATIENT PHYSICAL THERAPY NEURO TREATMENT      Patient Name: Kirk Bennett. MRN: [id number] DOB:[birth year], 72 y.o., male Today's Date: 01/17/2023   PCP: Eustaquio Boyden, MD  REFERRING PROVIDER: Demetrio Lapping,  MD    END OF SESSION:  PT End of Session - 01/17/23 1502     Visit Number 24    Number of Visits 33    Date for PT Re-Evaluation 02/16/23    Progress Note Due on Visit 30    PT Start Time 1452    PT Stop Time 1517    PT Time Calculation (min) 25 min    Equipment Utilized During Treatment Gait belt    Activity Tolerance Other (comment)    Behavior During Therapy WFL for tasks assessed/performed                 Past Medical History:  Diagnosis Date   Cataracts, bilateral 02/2011   and suspected glaucoma, to return for f/u, no diabetic retinopathy   CKD stage 3 due to type 2 diabetes mellitus (HCC) 2015   normal renal US, self referred to Dr Cherylann Ratel   Complex partial seizures (HCC) 1990   from surgery for R temporal arachnoid cyst s/p drainage, possible continued sz so changed to lamotrigine (Dr. 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 Tedd Sias endo --> 02/2016 decided to return to PCP for DM care   Past Surgical History:  Procedure Laterality Date   CARDIAC CATHETERIZATION  02/19/2009   No blockages (Dr. Christene Slates)   COLONOSCOPY  09/16/2005   hyperplastic polyps, rpt due 10 yrs    COLONOSCOPY WITH PROPOFOL N/A 12/11/2015   mult polyps, few TA, rpt 41yrs ([place])   COLONOSCOPY WITH PROPOFOL N/A 09/02/2021   TAs, HPs, int hem, distal ileal polyposis - benign, rpt yrs([place],  Thomas Hoff, DO)   corrective surgery amblyopia  08/23/1955   Cystectomy or meningioma removal brain  08/22/1988   (unclear)   Left knee surgery  08/22/1969   Torn ACL   REPLACEMENT TOTAL KNEE  11/20/2009   Left ([facility] Dr. 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   Irregular heart beat 07/19/2021   BPH (benign prostatic hyperplasia) 10/03/2020   Pain due to onychomycosis of toenails of both feet 06/18/2020   CKD stage 3 due to type 2 diabetes mellitus (HCC) 10/23/2019   Type 2 diabetes mellitus with diabetic neuropathy, unspecified (HCC) 10/21/2019   Impaired gait and mobility 10/14/2019   Postural dizziness with presyncope 10/13/2019   Ventricular premature complexes 10/13/2019   Urge urinary incontinence 09/26/2019   Depressed mood 09/26/2019   Advanced care planning/counseling discussion 09/22/2018   Weakness of both lower extremities 09/22/2018   History of hepatitis 01/05/2018   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   NAFLD (nonalcoholic fatty liver disease) 40/98/1191   Obesity, Class I, BMI 30.0-34.9 (see actual BMI) 09/18/2017   Polyarthralgia 02/15/2017   OSA (obstructive sleep apnea) 02/15/2017   Partial  epilepsy with impairment of consciousness (HCC) 01/29/2016   Low vitamin B12 level 05/27/2015   Elevated PSA 05/27/2015   Vitamin D deficiency 03/02/2015   Other long term (current) drug therapy 03/12/2012   Encounter for general adult medical examination with abnormal findings 04/26/2011   TOTAL KNEE REPLACEMENT, LEFT, HX OF 04/07/2010   Type 2 diabetes mellitus with diabetic nephropathy (HCC) 03/30/2010   Hyperlipidemia associated with type 2 diabetes mellitus (HCC) 03/30/2010   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) 03/30/2010   Essential hypertension, benign 03/30/2010    ONSET DATE: 05/2021  REFERRING DIAG: G20.C  (ICD-10-CM) - Parkinsonism, unspecified   THERAPY DIAG:  Abnormality of gait and mobility  Repeated falls  Other abnormalities of gait and mobility  Unsteadiness on feet  Muscle weakness (generalized)  Difficulty in walking, not elsewhere classified  Cognitive communication deficit  Abnormal posture  Atypical parkinsonism  Rationale for Evaluation and Treatment: Rehabilitation  SUBJECTIVE:                                                                                                                                                                                             SUBJECTIVE STATEMENT:   Pt arrived late to PT treatment. No reason given, just stated, " running behind" no other updates since last PT treatment.    Pt accompanied by: self and significant other  PERTINENT HISTORY: Diabetic neuropathy, atypical PD which MD thinks could be PSP, pt reports diagnosis is parkinson's disease.    PAIN:  Are you having pain? No  PRECAUTIONS: Fall  WEIGHT BEARING RESTRICTIONS: No  FALLS: Has patient fallen in last 6 months? Yes. Number of falls 1 fall when he slippe dand fell getting out of the shower.   LIVING ENVIRONMENT: Lives with: lives with their family and lives with their spouse Lives in: House/apartment Stairs: Yes: Internal: 13, 2 sets steps; on right going up and External: 5 steps; on right going up Has following equipment at home: Single point cane, Walker - 2 wheeled, shower chair, and elevated commode  PLOF: Independent with basic ADLs, Independent with household mobility with device, Independent with community mobility with device, and Needs assistance with homemaking  PATIENT GOALS: Lower and upper body strength as well as balance   OBJECTIVE:   DIAGNOSTIC FINDINGS: No significant findings   COGNITION: Overall cognitive status:  A little flat but otherwise WNL    SENSATION: Not tested  COORDINATION: Not tested   EDEMA:  None   MUSCLE  TONE: Not tested, if in supine may be good to assess if pt has rigidity    POSTURE: rounded shoulders,  forward head, and increased thoracic kyphosis  LOWER EXTREMITY ROM:   WNL in seated, in standing pt unable to obtain full erect posture.    LOWER EXTREMITY MMT:    MMT Right Eval Left Eval  Hip flexion 4 4-  Hip extension    Hip abduction 4+ 4+  Hip adduction 4 4  Hip internal rotation    Hip external rotation    Knee flexion 4+ 4+  Knee extension 4+ 4+  Ankle dorsiflexion 4+ 4+  Ankle plantarflexion 5 5  Ankle inversion    Ankle eversion    (Blank rows = not tested) All tested in seated position   BED MOBILITY:  Not tested at eval   TRANSFERS: Assistive device utilized:  UE and cane    Sit to stand: CGA Stand to sit: CGA Chair to chair: CGA Floor:  Not indicated for testing     CURB: Not tested   STAIRS: Not tested at eval, to look at in future assessments    GAIT: Gait pattern: step through pattern, decreased stride length, knee flexed in stance- Right, knee flexed in stance- Left, shuffling, and trunk flexed Distance walked: 30 feet  Assistive device utilized: Single point cane Level of assistance: CGA Comments: uses walls and other ibjects in clinic for steadying   FUNCTIONAL TESTS:  5 times sit to stand:  2/14:73.7 sec with significant UE assist and unable to stand in full erect posture. 3/19: 36.76 with UEpushing from arm rests  Timed up and go (TUG): 2/14: 42.45 sec with SPC and with UE assist on STS transfer. 3/19 55.2 sec with RW and UE assist for chair transfer. 10 meter walk test: 2/14: 57.80 with bariatric walker. 3/19: 44.31sec  Berg Balance Scale: 33/56   PATIENT SURVEYS:  FOTO 2/14: 40. 3/19: 50 4/25: 59    TODAY'S TREATMENT: DATE:01/17/2023   Standing step over cane in floor x 5 bil  with UE supported on RW.  Lateral step over can on floor x 5 bil with UE supported on RW.     Sit<>stand x 5 with UE pushing from chair with airex pad  in seat.   Gait  RW  x 132ft. + 78ft with CGA for safety and cues for improved step length, "over cane" as performed prior to gait training; with  significant improvement in step length following each instruction.       PATIENT EDUCATION: Education details: Pt educated throughout session about proper posture and technique with exercises. Improved exercise technique, movement at target joints, use of target muscles after min to mod verbal, visual, tactile cues.   Person educated: Patient and Spouse Education method: Explanation Education comprehension: verbalized understanding  HOME EXERCISE PROGRAM: Access Code: [email or web address] URL: [email or web address] Date: 10/25/2022 Prepared by: Grier Rocher  Exercises - Seated Hip Abduction with Pelvic Floor Contraction and Resistance Loop  - 1 x daily - 7 x weekly - 3 sets - 10 reps - Sitting Knee Extension with Resistance  - 1 x daily - 7 x weekly - 3 sets - 10 reps - Standing Hip Flexor Stretch  - 1 x daily - 7 x weekly - 3 sets - 10 reps  GOALS: Goals reviewed with patient? Yes  SHORT TERM GOALS: Target date: 11/02/2022  Patient will be independent in home exercise program to improve strength/mobility for better functional independence with ADLs. Baseline: provided on 3/5 Goal status: progressing    LONG TERM GOALS: Target date: 12/28/2022  1.  Patient (> 18 years old) will  complete five times sit to stand test in < 30 seconds indicating an increased LE strength and improved balance. Baseline: 2/14: 73.7 sec with significant UE assistance and not able to obtain full erect posture in standing. 3/19: 36.76 with UE to push from arm rest. 4/25: 30.8  Goal status: progressing   2.  Patient will increase FOTO score to equal to or greater than  47   to demonstrate statistically significant improvement in mobility and quality of life.  Baseline:2/14: 40 3/19: 50  4/25: 58  Goal status: MET    3.  Patient will increase Berg Balance  score by > 6 points to demonstrate decreased fall risk during functional activities. Baseline: 33/56 . 4/25: 29/56 Goal status: progressing    4.  Patient will reduce timed up and go to <25 seconds with LRAD to reduce fall risk and demonstrate improved transfer/gait ability. Baseline: 2/14: 42.45 with SPC and UE assist with stand. 3/19: 55.2 sec with HDRW  Goal status: progressing   5.  Patient will increase 10 meter walk test to >1.65m/s as to improve gait speed for better community ambulation and to reduce fall risk. Baseline: 57.80 sec with bariatric walker 3/19: 0.22 m/s (44.31 sec) Goal status: progressing     ASSESSMENT:  CLINICAL IMPRESSION: Session limited by late arrival to PT session. Pt performed well throughout therapy session. PT treatment focused on BLE strength and functional gait training. Pt able to demonstrate improved step length, but constant cues required from PT for symmetry and attention to gait pattern in distracting environment, and cues for AD management in turns and reduced festination through turn.  Pt will continue to benefit from skilled PT services to address gait, balance and strength deficits to reduce falls risk and optimize independence with mobility and ADL's.   OBJECTIVE IMPAIRMENTS: Abnormal gait, decreased activity tolerance, decreased balance, decreased endurance, decreased knowledge of use of DME, decreased mobility, difficulty walking, decreased strength, decreased safety awareness, impaired perceived functional ability, and improper body mechanics.   ACTIVITY LIMITATIONS: bending, sitting, standing, squatting, stairs, transfers, bed mobility, bathing, toileting, and locomotion level  PARTICIPATION LIMITATIONS:  Pt reports no participation restrictions when prompted   PERSONAL FACTORS: 3+ comorbidities: HTN, depressed mood ( chart), T2DM  are also affecting patient's functional outcome.   REHAB POTENTIAL: Fair secondary to current functional level  and progressive aspect of diagnosis   CLINICAL DECISION MAKING: Evolving/moderate complexity  EVALUATION COMPLEXITY: Moderate  PLAN:  PT FREQUENCY: 2x/week  PT DURATION: 12 weeks  PLANNED INTERVENTIONS: Therapeutic exercises, Therapeutic activity, Neuromuscular re-education, Balance training, Gait training, Patient/Family education, Self Care, Joint mobilization, Stair training, and Manual therapy   PLAN FOR NEXT SESSION:    BLE strengthening.  Continue endurance and gait training for PD     Grier Rocher PT, DPT  Physical Therapist - [facility]  [facility]  3:10 PM 01/17/23

## 2023-01-19 ENCOUNTER — Ambulatory Visit: Payer: 59 | Admitting: Physical Therapy

## 2023-01-19 DIAGNOSIS — R293 Abnormal posture: Secondary | ICD-10-CM

## 2023-01-19 DIAGNOSIS — R2681 Unsteadiness on feet: Secondary | ICD-10-CM

## 2023-01-19 DIAGNOSIS — M6281 Muscle weakness (generalized): Secondary | ICD-10-CM

## 2023-01-19 DIAGNOSIS — R269 Unspecified abnormalities of gait and mobility: Secondary | ICD-10-CM

## 2023-01-19 DIAGNOSIS — G20C Parkinsonism, unspecified: Secondary | ICD-10-CM

## 2023-01-19 DIAGNOSIS — R262 Difficulty in walking, not elsewhere classified: Secondary | ICD-10-CM

## 2023-01-19 DIAGNOSIS — R2689 Other abnormalities of gait and mobility: Secondary | ICD-10-CM

## 2023-01-19 DIAGNOSIS — R296 Repeated falls: Secondary | ICD-10-CM

## 2023-01-19 NOTE — Therapy (Signed)
OUTPATIENT PHYSICAL THERAPY NEURO TREATMENT      Patient Name: Kirk Bennett. MRN: [id number] DOB:12-Oct-1950, 72 y.o., male Today's Date: 01/17/2023   PCP: Eustaquio Boyden, MD  REFERRING PROVIDER: Demetrio Lapping,  MD    END OF SESSION:  PT End of Session - 01/19/23 1357     Visit Number 25    Number of Visits 33    Date for PT Re-Evaluation 02/16/23    Progress Note Due on Visit 30    PT Start Time 1350    PT Stop Time 1430    PT Time Calculation (min) 40 min    Equipment Utilized During Treatment Gait belt    Activity Tolerance Patient tolerated treatment well    Behavior During Therapy WFL for tasks assessed/performed                 Past Medical History:  Diagnosis Date   Cataracts, bilateral 02/2011   and suspected glaucoma, to return for f/u, no diabetic retinopathy   CKD stage 3 due to type 2 diabetes mellitus (HCC) 2015   normal renal US, self referred to Dr Cherylann Ratel   Complex partial seizures (HCC) 1990   from surgery for R temporal arachnoid cyst s/p drainage, possible continued sz so changed to lamotrigine (Dr. 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 Tedd Sias endo --> 02/2016 decided to return to PCP for DM care   Past Surgical History:  Procedure Laterality Date   CARDIAC CATHETERIZATION  02/19/2009   No blockages (Dr. Christene Slates)   COLONOSCOPY  09/16/2005   hyperplastic polyps, rpt due 10 yrs    COLONOSCOPY WITH PROPOFOL N/A 12/11/2015   mult polyps, few TA, rpt 56yrs ([place])   COLONOSCOPY WITH PROPOFOL N/A 09/02/2021   TAs, HPs, int hem, distal ileal polyposis -  benign, rpt yrs([place], Thomas Hoff, DO)   corrective surgery amblyopia  08/23/1955   Cystectomy or meningioma removal brain  08/22/1988   (unclear)   Left knee surgery  08/22/1969   Torn ACL   REPLACEMENT TOTAL KNEE  11/20/2009   Left ([facility] Dr. 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   Irregular heart beat 07/19/2021   BPH (benign prostatic hyperplasia) 10/03/2020   Pain due to onychomycosis of toenails of both feet 06/18/2020   CKD stage 3 due to type 2 diabetes mellitus (HCC) 10/23/2019   Type 2 diabetes mellitus with diabetic neuropathy, unspecified (HCC) 10/21/2019   Impaired gait and mobility 10/14/2019   Postural dizziness with presyncope 10/13/2019   Ventricular premature complexes 10/13/2019   Urge urinary incontinence 09/26/2019   Depressed mood 09/26/2019   Advanced care planning/counseling discussion 09/22/2018   Weakness of both lower extremities 09/22/2018   History of hepatitis 01/05/2018   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   NAFLD (nonalcoholic fatty liver disease) 81/19/1478   Obesity, Class I, BMI 30.0-34.9 (see actual BMI) 09/18/2017   Polyarthralgia 02/15/2017   OSA (obstructive sleep apnea) 02/15/2017  Partial epilepsy with impairment of consciousness (HCC) 01/29/2016   Low vitamin B12 level 05/27/2015   Elevated PSA 05/27/2015   Vitamin D deficiency 03/02/2015   Other long term (current) drug therapy 03/12/2012   Encounter for general adult medical examination with abnormal findings 04/26/2011   TOTAL KNEE REPLACEMENT, LEFT, HX OF 04/07/2010   Type 2 diabetes mellitus with diabetic nephropathy (HCC) 03/30/2010   Hyperlipidemia associated with type 2 diabetes mellitus (HCC) 03/30/2010   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) 03/30/2010   Essential hypertension, benign 03/30/2010    ONSET DATE:  05/2021  REFERRING DIAG: G20.C (ICD-10-CM) - Parkinsonism, unspecified   THERAPY DIAG:  Abnormality of gait and mobility  Repeated falls  Other abnormalities of gait and mobility  Unsteadiness on feet  Muscle weakness (generalized)  Difficulty in walking, not elsewhere classified  Cognitive communication deficit  Abnormal posture  Atypical parkinsonism  Rationale for Evaluation and Treatment: Rehabilitation  SUBJECTIVE:                                                                                                                                                                                             SUBJECTIVE STATEMENT:   Pt reports that he is doing well.  No reports of falls or stumbles since last therapy session.  No pain noted on this day.   Pt accompanied by: self and significant other  PERTINENT HISTORY: Diabetic neuropathy, atypical PD which MD thinks could be PSP, pt reports diagnosis is parkinson's disease.    PAIN:  Are you having pain? No  PRECAUTIONS: Fall  WEIGHT BEARING RESTRICTIONS: No  FALLS: Has patient fallen in last 6 months? Yes. Number of falls 1 fall when he slippe dand fell getting out of the shower.   LIVING ENVIRONMENT: Lives with: lives with their family and lives with their spouse Lives in: House/apartment Stairs: Yes: Internal: 13, 2 sets steps; on right going up and External: 5 steps; on right going up Has following equipment at home: Single point cane, Walker - 2 wheeled, shower chair, and elevated commode  PLOF: Independent with basic ADLs, Independent with household mobility with device, Independent with community mobility with device, and Needs assistance with homemaking  PATIENT GOALS: Lower and upper body strength as well as balance   OBJECTIVE:   DIAGNOSTIC FINDINGS: No significant findings   COGNITION: Overall cognitive status:  A little flat but otherwise WNL    SENSATION: Not tested  COORDINATION: Not tested    EDEMA:  None   MUSCLE TONE: Not tested, if in supine may be good to assess if pt has rigidity  POSTURE: rounded shoulders, forward head, and increased thoracic kyphosis  LOWER EXTREMITY ROM:   WNL in seated, in standing pt unable to obtain full erect posture.    LOWER EXTREMITY MMT:    MMT Right Eval Left Eval  Hip flexion 4 4-  Hip extension    Hip abduction 4+ 4+  Hip adduction 4 4  Hip internal rotation    Hip external rotation    Knee flexion 4+ 4+  Knee extension 4+ 4+  Ankle dorsiflexion 4+ 4+  Ankle plantarflexion 5 5  Ankle inversion    Ankle eversion    (Blank rows = not tested) All tested in seated position   BED MOBILITY:  Not tested at eval   TRANSFERS: Assistive device utilized:  UE and cane    Sit to stand: CGA Stand to sit: CGA Chair to chair: CGA Floor:  Not indicated for testing     CURB: Not tested   STAIRS: Not tested at eval, to look at in future assessments    GAIT: Gait pattern: step through pattern, decreased stride length, knee flexed in stance- Right, knee flexed in stance- Left, shuffling, and trunk flexed Distance walked: 30 feet  Assistive device utilized: Single point cane Level of assistance: CGA Comments: uses walls and other ibjects in clinic for steadying   FUNCTIONAL TESTS:  5 times sit to stand:  2/14:73.7 sec with significant UE assist and unable to stand in full erect posture. 3/19: 36.76 with UEpushing from arm rests  Timed up and go (TUG): 2/14: 42.45 sec with SPC and with UE assist on STS transfer. 3/19 55.2 sec with RW and UE assist for chair transfer. 10 meter walk test: 2/14: 57.80 with bariatric walker. 3/19: 44.31sec  Berg Balance Scale: 33/56   PATIENT SURVEYS:  FOTO 2/14: 40. 3/19: 50 4/25: 59    TODAY'S TREATMENT: DATE:01/17/2023  Stand pivot transfer to mat table   Sit<>stand from mat table 2x 8 with cues for UE support to push from mat table from PT. inconsistent upper extremity placement,  noted to keep bilateral upper extremities on rolling walker unless instructed otherwise by PT  Gait training with RW no resistance x 136ft. Therapeutic rest break  Performed additional gait training with 2.5# ankle weights x 134ft  Supervision assist from PT for safety with gait training  Cues for increased step length from PT.  And improved attention to task to limit festination while distracted, and improved assistive device management.  Seated therex:  Long arc quad 2.5 pound ankle weights 2 x 10 Hamstring curl RT B 2 x 10 Hip abduction RT B 2 x 10 Reciprocal march 2.5 pound ankle weights 2 x 10  Patient requested a prolonged therapeutic rest break following gait training.   PATIENT EDUCATION: Education details: Pt educated throughout session about proper posture and technique with exercises. Improved exercise technique, movement at target joints, use of target muscles after min to mod verbal, visual, tactile cues.   Person educated: Patient and Spouse Education method: Explanation Education comprehension: verbalized understanding  HOME EXERCISE PROGRAM: Access Code: [email or web address] URL: [email or web address] Date: 10/25/2022 Prepared by: Grier Rocher  Exercises - Seated Hip Abduction with Pelvic Floor Contraction and Resistance Loop  - 1 x daily - 7 x weekly - 3 sets - 10 reps - Sitting Knee Extension with Resistance  - 1 x daily - 7 x weekly - 3 sets - 10 reps - Standing Hip Flexor Stretch  - 1 x daily - 7 x weekly -  3 sets - 10 reps  GOALS: Goals reviewed with patient? Yes  SHORT TERM GOALS: Target date: 11/02/2022  Patient will be independent in home exercise program to improve strength/mobility for better functional independence with ADLs. Baseline: provided on 3/5 Goal status: progressing    LONG TERM GOALS: Target date: 12/28/2022  1.  Patient (> 52 years old) will complete five times sit to stand test in < 30 seconds indicating an increased LE strength  and improved balance. Baseline: 2/14: 73.7 sec with significant UE assistance and not able to obtain full erect posture in standing. 3/19: 36.76 with UE to push from arm rest. 4/25: 30.8  Goal status: progressing   2.  Patient will increase FOTO score to equal to or greater than  47   to demonstrate statistically significant improvement in mobility and quality of life.  Baseline:2/14: 40 3/19: 50  4/25: 58  Goal status: MET    3.  Patient will increase Berg Balance score by > 6 points to demonstrate decreased fall risk during functional activities. Baseline: 33/56 . 4/25: 29/56 Goal status: progressing    4.  Patient will reduce timed up and go to <25 seconds with LRAD to reduce fall risk and demonstrate improved transfer/gait ability. Baseline: 2/14: 42.45 with SPC and UE assist with stand. 3/19: 55.2 sec with HDRW  Goal status: progressing   5.  Patient will increase 10 meter walk test to >1.15m/s as to improve gait speed for better community ambulation and to reduce fall risk. Baseline: 57.80 sec with bariatric walker 3/19: 0.22 m/s (44.31 sec) Goal status: progressing     ASSESSMENT:  CLINICAL IMPRESSION: Pt performed well throughout therapy session. PT treatment focused on BLE strength and functional gait training.  Patient noted to be distractible on this day requiring increased instruction from therapist to attend to therapeutic activities.  Noted to have increased festination and decreased step length while distracted during gait training.  Cues for decreased speed of movement for all therapeutic exercise.  Pt will continue to benefit from skilled PT services to address gait, balance and strength deficits to reduce falls risk and optimize independence with mobility and ADL's.   OBJECTIVE IMPAIRMENTS: Abnormal gait, decreased activity tolerance, decreased balance, decreased endurance, decreased knowledge of use of DME, decreased mobility, difficulty walking, decreased strength,  decreased safety awareness, impaired perceived functional ability, and improper body mechanics.   ACTIVITY LIMITATIONS: bending, sitting, standing, squatting, stairs, transfers, bed mobility, bathing, toileting, and locomotion level  PARTICIPATION LIMITATIONS:  Pt reports no participation restrictions when prompted   PERSONAL FACTORS: 3+ comorbidities: HTN, depressed mood ( chart), T2DM  are also affecting patient's functional outcome.   REHAB POTENTIAL: Fair secondary to current functional level and progressive aspect of diagnosis   CLINICAL DECISION MAKING: Evolving/moderate complexity  EVALUATION COMPLEXITY: Moderate  PLAN:  PT FREQUENCY: 2x/week  PT DURATION: 12 weeks  PLANNED INTERVENTIONS: Therapeutic exercises, Therapeutic activity, Neuromuscular re-education, Balance training, Gait training, Patient/Family education, Self Care, Joint mobilization, Stair training, and Manual therapy   PLAN FOR NEXT SESSION:     Continue endurance and gait training for PD   Ble strengthening.  Grier Rocher PT, DPT  Physical Therapist - [place]  [facility]  3:10 PM 01/17/23

## 2023-01-24 ENCOUNTER — Ambulatory Visit: Payer: 59 | Attending: Neurology | Admitting: Physical Therapy

## 2023-01-24 DIAGNOSIS — R293 Abnormal posture: Secondary | ICD-10-CM | POA: Insufficient documentation

## 2023-01-24 DIAGNOSIS — R296 Repeated falls: Secondary | ICD-10-CM | POA: Insufficient documentation

## 2023-01-24 DIAGNOSIS — R269 Unspecified abnormalities of gait and mobility: Secondary | ICD-10-CM | POA: Insufficient documentation

## 2023-01-24 DIAGNOSIS — M6281 Muscle weakness (generalized): Secondary | ICD-10-CM | POA: Insufficient documentation

## 2023-01-24 DIAGNOSIS — R262 Difficulty in walking, not elsewhere classified: Secondary | ICD-10-CM | POA: Diagnosis present

## 2023-01-24 DIAGNOSIS — R2689 Other abnormalities of gait and mobility: Secondary | ICD-10-CM | POA: Diagnosis present

## 2023-01-24 DIAGNOSIS — G20C Parkinsonism, unspecified: Secondary | ICD-10-CM | POA: Insufficient documentation

## 2023-01-24 DIAGNOSIS — R2681 Unsteadiness on feet: Secondary | ICD-10-CM | POA: Insufficient documentation

## 2023-01-24 NOTE — Therapy (Unsigned)
OUTPATIENT PHYSICAL THERAPY NEURO TREATMENT      Patient Name: Kirk Bennett. MRN: [id number] DOB:05/16/1951, 72 y.o., male Today's Date: 01/24/2023   PCP: Eustaquio Boyden, MD  REFERRING PROVIDER: Demetrio Lapping,  MD    END OF SESSION:  PT End of Session - 01/24/23 1439     Visit Number 26    Number of Visits 33    Date for PT Re-Evaluation 02/16/23    Progress Note Due on Visit 30    PT Start Time 1434    PT Stop Time 1515    PT Time Calculation (min) 41 min    Equipment Utilized During Treatment Gait belt    Activity Tolerance Patient tolerated treatment well    Behavior During Therapy WFL for tasks assessed/performed                 Past Medical History:  Diagnosis Date   Cataracts, bilateral 02/2011   and suspected glaucoma, to return for f/u, no diabetic retinopathy   CKD stage 3 due to type 2 diabetes mellitus (HCC) 2015   normal renal US, self referred to Dr Cherylann Ratel   Complex partial seizures (HCC) 1990   from surgery for R temporal arachnoid cyst s/p drainage, possible continued sz so changed to lamotrigine (Dr. 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 Tedd Sias endo --> 02/2016 decided to return to PCP for DM care   Past Surgical History:  Procedure Laterality Date   CARDIAC CATHETERIZATION  02/19/2009   No blockages (Dr. Christene Slates)   COLONOSCOPY  09/16/2005   hyperplastic polyps, rpt due 10 yrs    COLONOSCOPY WITH PROPOFOL N/A 12/11/2015   mult polyps, few TA, rpt 4yrs ([place])   COLONOSCOPY WITH PROPOFOL N/A 09/02/2021   TAs, HPs, int hem, distal ileal polyposis - benign,  rpt yrs([place], Thomas Hoff, DO)   corrective surgery amblyopia  08/23/1955   Cystectomy or meningioma removal brain  08/22/1988   (unclear)   Left knee surgery  08/22/1969   Torn ACL   REPLACEMENT TOTAL KNEE  11/20/2009   Left ([facility] Dr. 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   Irregular heart beat 07/19/2021   BPH (benign prostatic hyperplasia) 10/03/2020   Pain due to onychomycosis of toenails of both feet 06/18/2020   CKD stage 3 due to type 2 diabetes mellitus (HCC) 10/23/2019   Type 2 diabetes mellitus with diabetic neuropathy, unspecified (HCC) 10/21/2019   Impaired gait and mobility 10/14/2019   Postural dizziness with presyncope 10/13/2019   Ventricular premature complexes 10/13/2019   Urge urinary incontinence 09/26/2019   Depressed mood 09/26/2019   Advanced care planning/counseling discussion 09/22/2018   Weakness of both lower extremities 09/22/2018   History of hepatitis 01/05/2018   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   NAFLD (nonalcoholic fatty liver disease) 40/98/1191   Obesity, Class I, BMI 30.0-34.9 (see actual BMI) 09/18/2017   Polyarthralgia 02/15/2017   OSA (obstructive sleep apnea) 02/15/2017  Partial epilepsy with impairment of consciousness (HCC) 01/29/2016   Low vitamin B12 level 05/27/2015   Elevated PSA 05/27/2015   Vitamin D deficiency 03/02/2015   Other long term (current) drug therapy 03/12/2012   Encounter for general adult medical examination with abnormal findings 04/26/2011   TOTAL KNEE REPLACEMENT, LEFT, HX OF 04/07/2010   Type 2 diabetes mellitus with diabetic nephropathy (HCC) 03/30/2010   Hyperlipidemia associated with type 2 diabetes mellitus (HCC) 03/30/2010   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) 03/30/2010   Essential hypertension, benign 03/30/2010    ONSET DATE: 05/2021  REFERRING DIAG:  G20.C (ICD-10-CM) - Parkinsonism, unspecified   THERAPY DIAG:  Abnormality of gait and mobility  Other abnormalities of gait and mobility  Repeated falls  Unsteadiness on feet  Muscle weakness (generalized)  Difficulty in walking, not elsewhere classified  Abnormal posture  Atypical parkinsonism  Rationale for Evaluation and Treatment: Rehabilitation  SUBJECTIVE:                                                                                                                                                                                             SUBJECTIVE STATEMENT:   Pt reports that he is doing well.  No reports of falls or stumbles since last therapy session.  No pain noted on this day.   Pt accompanied by: self and significant other  PERTINENT HISTORY: Diabetic neuropathy, atypical PD which MD thinks could be PSP, pt reports diagnosis is parkinson's disease.    PAIN:  Are you having pain? No  PRECAUTIONS: Fall  WEIGHT BEARING RESTRICTIONS: No  FALLS: Has patient fallen in last 6 months? Yes. Number of falls 1 fall when he slippe dand fell getting out of the shower.   LIVING ENVIRONMENT: Lives with: lives with their family and lives with their spouse Lives in: House/apartment Stairs: Yes: Internal: 13, 2 sets steps; on right going up and External: 5 steps; on right going up Has following equipment at home: Single point cane, Walker - 2 wheeled, shower chair, and elevated commode  PLOF: Independent with basic ADLs, Independent with household mobility with device, Independent with community mobility with device, and Needs assistance with homemaking  PATIENT GOALS: Lower and upper body strength as well as balance   OBJECTIVE:   DIAGNOSTIC FINDINGS: No significant findings   COGNITION: Overall cognitive status:  A little flat but otherwise WNL    SENSATION: Not tested  COORDINATION: Not tested   EDEMA:  None   MUSCLE TONE: Not tested, if in supine  may be good to assess if pt has rigidity    POSTURE: rounded shoulders,  forward head, and increased thoracic kyphosis  LOWER EXTREMITY ROM:   WNL in seated, in standing pt unable to obtain full erect posture.    LOWER EXTREMITY MMT:    MMT Right Eval Left Eval  Hip flexion 4 4-  Hip extension    Hip abduction 4+ 4+  Hip adduction 4 4  Hip internal rotation    Hip external rotation    Knee flexion 4+ 4+  Knee extension 4+ 4+  Ankle dorsiflexion 4+ 4+  Ankle plantarflexion 5 5  Ankle inversion    Ankle eversion    (Blank rows = not tested) All tested in seated position   BED MOBILITY:  Not tested at eval   TRANSFERS: Assistive device utilized:  UE and cane    Sit to stand: CGA Stand to sit: CGA Chair to chair: CGA Floor:  Not indicated for testing     CURB: Not tested   STAIRS: Not tested at eval, to look at in future assessments    GAIT: Gait pattern: step through pattern, decreased stride length, knee flexed in stance- Right, knee flexed in stance- Left, shuffling, and trunk flexed Distance walked: 30 feet  Assistive device utilized: Single point cane Level of assistance: CGA Comments: uses walls and other ibjects in clinic for steadying   FUNCTIONAL TESTS:  5 times sit to stand:  2/14:73.7 sec with significant UE assist and unable to stand in full erect posture. 3/19: 36.76 with UEpushing from arm rests  Timed up and go (TUG): 2/14: 42.45 sec with SPC and with UE assist on STS transfer. 3/19 55.2 sec with RW and UE assist for chair transfer. 10 meter walk test: 2/14: 57.80 with bariatric walker. 3/19: 44.31sec  Berg Balance Scale: 33/56   PATIENT SURVEYS:  FOTO 2/14: 40. 3/19: 50 4/25: 59    TODAY'S TREATMENT: DATE:01/24/2023  Stand pivot transfer to nustep  with min assist for adequate anterior weight shift.   Nustep reciprocal movement training x 5 min level 2 with constant cues for increased steps per min. And increased ROM in BLE/BUE   Gait in  rehab gym with RW x 33ft with CGA with cues initially for increased step length; greatly improved step length after the initial 65ft.  Sit<>stand from arm cahir with airex pad in seat 3x 5  Standing therex with UE supported on rail at wall:  Reciprocal march x 12  Hip abduction x 10  Hip extension x 10  Knee flexion x 12  Reporting mild dizziness in standing following first set of hip extension   Orthostatic BP assessed:  Sitting 148/79 HR 83 Standing 143/78 HR 88 mild s/s per pt.   Gait with RW x 53ft with supervision assist and cues for improved step length past the contralateral LE  Pt monitored throughout session for signs of fatigue to maintain adequate safety throughout therapeutic interventions. Pt noted to require therapeutic rest breaks between each set of standing therex to maintin adequate technique and reduce LBP.     PATIENT EDUCATION: Education details: Pt educated throughout session about proper posture and technique with exercises. Improved exercise technique, movement at target joints, use of target muscles after min to mod verbal, visual, tactile cues.   Person educated: Patient and Spouse Education method: Explanation Education comprehension: verbalized understanding  HOME EXERCISE PROGRAM: Access Code: [email or web address] URL: [email or web address] Date: 10/25/2022 Prepared by: Grier Rocher  Exercises - Seated Hip Abduction with Pelvic Floor Contraction and Resistance Loop  - 1 x daily - 7 x  weekly - 3 sets - 10 reps - Sitting Knee Extension with Resistance  - 1 x daily - 7 x weekly - 3 sets - 10 reps - Standing Hip Flexor Stretch  - 1 x daily - 7 x weekly - 3 sets - 10 reps  GOALS: Goals reviewed with patient? Yes  SHORT TERM GOALS: Target date: 11/02/2022  Patient will be independent in home exercise program to improve strength/mobility for better functional independence with ADLs. Baseline: provided on 3/5 Goal status: progressing    LONG TERM  GOALS: Target date: 12/28/2022  1.  Patient (> 64 years old) will complete five times sit to stand test in < 30 seconds indicating an increased LE strength and improved balance. Baseline: 2/14: 73.7 sec with significant UE assistance and not able to obtain full erect posture in standing. 3/19: 36.76 with UE to push from arm rest. 4/25: 30.8  Goal status: progressing   2.  Patient will increase FOTO score to equal to or greater than  47   to demonstrate statistically significant improvement in mobility and quality of life.  Baseline:2/14: 40 3/19: 50  4/25: 58  Goal status: MET    3.  Patient will increase Berg Balance score by > 6 points to demonstrate decreased fall risk during functional activities. Baseline: 33/56 . 4/25: 29/56 Goal status: progressing    4.  Patient will reduce timed up and go to <25 seconds with LRAD to reduce fall risk and demonstrate improved transfer/gait ability. Baseline: 2/14: 42.45 with SPC and UE assist with stand. 3/19: 55.2 sec with HDRW  Goal status: progressing   5.  Patient will increase 10 meter walk test to >1.35m/s as to improve gait speed for better community ambulation and to reduce fall risk. Baseline: 57.80 sec with bariatric walker 3/19: 0.22 m/s (44.31 sec) Goal status: progressing     ASSESSMENT:  CLINICAL IMPRESSION: Pt performed well throughout therapy session. PT treatment focused on BLE strength and functional gait training for short distances.  Multiple prolonged therapeutic rest breaks required throughout session due to reported in with dizziness upon standing and mild low back pain.  Cues for decreased speed of movement for all therapeutic exercise.  Patient able to demonstrate improved step length with short bouts of gait training on this day.  Pt will continue to benefit from skilled PT services to address gait, balance and strength deficits to reduce falls risk and optimize independence with mobility and ADL's.   OBJECTIVE IMPAIRMENTS:  Abnormal gait, decreased activity tolerance, decreased balance, decreased endurance, decreased knowledge of use of DME, decreased mobility, difficulty walking, decreased strength, decreased safety awareness, impaired perceived functional ability, and improper body mechanics.   ACTIVITY LIMITATIONS: bending, sitting, standing, squatting, stairs, transfers, bed mobility, bathing, toileting, and locomotion level  PARTICIPATION LIMITATIONS:  Pt reports no participation restrictions when prompted   PERSONAL FACTORS: 3+ comorbidities: HTN, depressed mood ( chart), T2DM  are also affecting patient's functional outcome.   REHAB POTENTIAL: Fair secondary to current functional level and progressive aspect of diagnosis   CLINICAL DECISION MAKING: Evolving/moderate complexity  EVALUATION COMPLEXITY: Moderate  PLAN:  PT FREQUENCY: 2x/week  PT DURATION: 12 weeks  PLANNED INTERVENTIONS: Therapeutic exercises, Therapeutic activity, Neuromuscular re-education, Balance training, Gait training, Patient/Family education, Self Care, Joint mobilization, Stair training, and Manual therapy   PLAN FOR NEXT SESSION:     Continue endurance and gait training for PD   Ble strengthening.  Grier Rocher PT, DPT  Physical Therapist - [place]  [facility]  5:04 PM 01/24/23

## 2023-01-26 ENCOUNTER — Ambulatory Visit: Payer: 59 | Admitting: Physical Therapy

## 2023-01-31 ENCOUNTER — Ambulatory Visit: Payer: 59 | Admitting: Physical Therapy

## 2023-01-31 DIAGNOSIS — M6281 Muscle weakness (generalized): Secondary | ICD-10-CM

## 2023-01-31 DIAGNOSIS — R262 Difficulty in walking, not elsewhere classified: Secondary | ICD-10-CM

## 2023-01-31 DIAGNOSIS — R293 Abnormal posture: Secondary | ICD-10-CM

## 2023-01-31 DIAGNOSIS — G20C Parkinsonism, unspecified: Secondary | ICD-10-CM

## 2023-01-31 DIAGNOSIS — R296 Repeated falls: Secondary | ICD-10-CM

## 2023-01-31 DIAGNOSIS — R269 Unspecified abnormalities of gait and mobility: Secondary | ICD-10-CM

## 2023-01-31 DIAGNOSIS — R2689 Other abnormalities of gait and mobility: Secondary | ICD-10-CM

## 2023-01-31 DIAGNOSIS — R2681 Unsteadiness on feet: Secondary | ICD-10-CM

## 2023-01-31 NOTE — Therapy (Signed)
OUTPATIENT PHYSICAL THERAPY NEURO TREATMENT      Patient Name: Kirk Bennett. MRN: [id number] DOB:06/20/1951, 72 y.o., male Today's Date: 01/31/2023   PCP: Eustaquio Boyden, MD  REFERRING PROVIDER: Demetrio Lapping,  MD    END OF SESSION:  PT End of Session - 01/31/23 1447     Visit Number 27    Number of Visits 33    Date for PT Re-Evaluation 02/16/23    Progress Note Due on Visit 30    PT Start Time 1445    PT Stop Time 1515    PT Time Calculation (min) 30 min    Equipment Utilized During Treatment Gait belt    Activity Tolerance Patient tolerated treatment well    Behavior During Therapy WFL for tasks assessed/performed                 Past Medical History:  Diagnosis Date   Cataracts, bilateral 02/2011   and suspected glaucoma, to return for f/u, no diabetic retinopathy   CKD stage 3 due to type 2 diabetes mellitus (HCC) 2015   normal renal US, self referred to Dr Cherylann Ratel   Complex partial seizures (HCC) 1990   from surgery for R temporal arachnoid cyst s/p drainage, possible continued sz so changed to lamotrigine (Dr. 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 Tedd Sias endo --> 02/2016 decided to return to PCP for DM care   Past Surgical History:  Procedure Laterality Date   CARDIAC CATHETERIZATION  02/19/2009   No blockages (Dr. Christene Slates)   COLONOSCOPY  09/16/2005   hyperplastic polyps, rpt due 10 yrs    COLONOSCOPY WITH PROPOFOL N/A 12/11/2015   mult polyps, few TA, rpt 66yrs ([place])   COLONOSCOPY WITH PROPOFOL N/A 09/02/2021   TAs, HPs, int hem, distal ileal polyposis -  benign, rpt yrs([place], Thomas Hoff, DO)   corrective surgery amblyopia  08/23/1955   Cystectomy or meningioma removal brain  08/22/1988   (unclear)   Left knee surgery  08/22/1969   Torn ACL   REPLACEMENT TOTAL KNEE  11/20/2009   Left ([facility] Dr. 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   Irregular heart beat 07/19/2021   BPH (benign prostatic hyperplasia) 10/03/2020   Pain due to onychomycosis of toenails of both feet 06/18/2020   CKD stage 3 due to type 2 diabetes mellitus (HCC) 10/23/2019   Type 2 diabetes mellitus with diabetic neuropathy, unspecified (HCC) 10/21/2019   Impaired gait and mobility 10/14/2019   Postural dizziness with presyncope 10/13/2019   Ventricular premature complexes 10/13/2019   Urge urinary incontinence 09/26/2019   Depressed mood 09/26/2019   Advanced care planning/counseling discussion 09/22/2018   Weakness of both lower extremities 09/22/2018   History of hepatitis 01/05/2018   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   NAFLD (nonalcoholic fatty liver disease) 40/98/1191   Obesity, Class I, BMI 30.0-34.9 (see actual BMI) 09/18/2017   Polyarthralgia 02/15/2017   OSA (obstructive sleep apnea) 02/15/2017  Partial epilepsy with impairment of consciousness (HCC) 01/29/2016   Low vitamin B12 level 05/27/2015   Elevated PSA 05/27/2015   Vitamin D deficiency 03/02/2015   Other long term (current) drug therapy 03/12/2012   Encounter for general adult medical examination with abnormal findings 04/26/2011   TOTAL KNEE REPLACEMENT, LEFT, HX OF 04/07/2010   Type 2 diabetes mellitus with diabetic nephropathy (HCC) 03/30/2010   Hyperlipidemia associated with type 2 diabetes mellitus (HCC) 03/30/2010   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) 03/30/2010   Essential hypertension, benign 03/30/2010    ONSET DATE:  05/2021  REFERRING DIAG: G20.C (ICD-10-CM) - Parkinsonism, unspecified   THERAPY DIAG:  Abnormality of gait and mobility  Unsteadiness on feet  Abnormal posture  Other abnormalities of gait and mobility  Atypical parkinsonism  Muscle weakness (generalized)  Repeated falls  Difficulty in walking, not elsewhere classified  Rationale for Evaluation and Treatment: Rehabilitation  SUBJECTIVE:                                                                                                                                                                                             SUBJECTIVE STATEMENT:   Pt reports that he is doing well.  Arrived late to therapy session.  No reason given.  No other updates since prior therapy session.  Stress   Pt accompanied by: self and significant other  PERTINENT HISTORY: Diabetic neuropathy, atypical PD which MD thinks could be PSP, pt reports diagnosis is parkinson's disease.    PAIN:  Are you having pain? No  PRECAUTIONS: Fall  WEIGHT BEARING RESTRICTIONS: No  FALLS: Has patient fallen in last 6 months? Yes. Number of falls 1 fall when he slippe dand fell getting out of the shower.   LIVING ENVIRONMENT: Lives with: lives with their family and lives with their spouse Lives in: House/apartment Stairs: Yes: Internal: 13, 2 sets steps; on right going up and External: 5 steps; on right going up Has following equipment at home: Single point cane, Walker - 2 wheeled, shower chair, and elevated commode  PLOF: Independent with basic ADLs, Independent with household mobility with device, Independent with community mobility with device, and Needs assistance with homemaking  PATIENT GOALS: Lower and upper body strength as well as balance   OBJECTIVE:   DIAGNOSTIC FINDINGS: No significant findings   COGNITION: Overall cognitive status:  A little flat but otherwise WNL    SENSATION: Not tested  COORDINATION: Not tested   EDEMA:   None   MUSCLE TONE: Not tested, if in supine may be good to assess if pt has rigidity    POSTURE:  rounded shoulders, forward head, and increased thoracic kyphosis  LOWER EXTREMITY ROM:   WNL in seated, in standing pt unable to obtain full erect posture.    LOWER EXTREMITY MMT:    MMT Right Eval Left Eval  Hip flexion 4 4-  Hip extension    Hip abduction 4+ 4+  Hip adduction 4 4  Hip internal rotation    Hip external rotation    Knee flexion 4+ 4+  Knee extension 4+ 4+  Ankle dorsiflexion 4+ 4+  Ankle plantarflexion 5 5  Ankle inversion    Ankle eversion    (Blank rows = not tested) All tested in seated position   BED MOBILITY:  Not tested at eval   TRANSFERS: Assistive device utilized:  UE and cane    Sit to stand: CGA Stand to sit: CGA Chair to chair: CGA Floor:  Not indicated for testing     CURB: Not tested   STAIRS: Not tested at eval, to look at in future assessments    GAIT: Gait pattern: step through pattern, decreased stride length, knee flexed in stance- Right, knee flexed in stance- Left, shuffling, and trunk flexed Distance walked: 30 feet  Assistive device utilized: Single point cane Level of assistance: CGA Comments: uses walls and other ibjects in clinic for steadying   FUNCTIONAL TESTS:  5 times sit to stand:  2/14:73.7 sec with significant UE assist and unable to stand in full erect posture. 3/19: 36.76 with UEpushing from arm rests  Timed up and go (TUG): 2/14: 42.45 sec with SPC and with UE assist on STS transfer. 3/19 55.2 sec with RW and UE assist for chair transfer. 10 meter walk test: 2/14: 57.80 with bariatric walker. 3/19: 44.31sec  Berg Balance Scale: 33/56   PATIENT SURVEYS:  FOTO 2/14: 40. 3/19: 50 4/25: 59    TODAY'S TREATMENT: DATE:01/31/2023  Sit<>stand 2x 5 from standard seat height BUE pushing from arm rest.   Stepping to target on floor at 18 inches x 12 bil: UE supported on RW   Gait with RW 2 x 16ft + 15ft. Min  cues for improved step length on the RLE as well as AD management in turns intermittently. Noted to have improved consistency in appropriate AD management in turns on this day.   Seated wind mill x 10 bil.  Standing cross body reach then trunkal opening to place hedge hog behind torso. X 10 bil cues for posture between rotation and proper use of BUE.   Cues from PT for improved posture, step length and Ad management throughout therapeutic interventions.    PATIENT EDUCATION: Education details: Pt educated throughout session about proper posture and technique with exercises. Improved exercise technique, movement at target joints, use of target muscles after min to mod verbal, visual, tactile cues.   Person educated: Patient and Spouse Education method: Explanation Education comprehension: verbalized understanding  HOME EXERCISE PROGRAM: Access Code: [email or web address] URL: [email or web address] Date: 10/25/2022 Prepared by: Grier Rocher  Exercises - Seated Hip Abduction with Pelvic Floor Contraction and Resistance Loop  - 1 x daily - 7 x weekly - 3 sets - 10 reps - Sitting Knee Extension with Resistance  - 1 x daily - 7 x weekly - 3 sets - 10 reps - Standing Hip Flexor Stretch  - 1 x daily - 7 x weekly - 3 sets - 10 reps  GOALS: Goals reviewed with patient? Yes  SHORT TERM GOALS: Target date: 11/02/2022  Patient will be independent in home exercise program  to improve strength/mobility for better functional independence with ADLs. Baseline: provided on 3/5 Goal status: progressing    LONG TERM GOALS: Target date: 12/28/2022  1.  Patient (> 50 years old) will complete five times sit to stand test in < 30 seconds indicating an increased LE strength and improved balance. Baseline: 2/14: 73.7 sec with significant UE assistance and not able to obtain full erect posture in standing. 3/19: 36.76 with UE to push from arm rest. 4/25: 30.8  Goal status: progressing   2.  Patient will  increase FOTO score to equal to or greater than  47   to demonstrate statistically significant improvement in mobility and quality of life.  Baseline:2/14: 40 3/19: 50  4/25: 58  Goal status: MET    3.  Patient will increase Berg Balance score by > 6 points to demonstrate decreased fall risk during functional activities. Baseline: 33/56 . 4/25: 29/56 Goal status: progressing    4.  Patient will reduce timed up and go to <25 seconds with LRAD to reduce fall risk and demonstrate improved transfer/gait ability. Baseline: 2/14: 42.45 with SPC and UE assist with stand. 3/19: 55.2 sec with HDRW  Goal status: progressing   5.  Patient will increase 10 meter walk test to >1.20m/s as to improve gait speed for better community ambulation and to reduce fall risk. Baseline: 57.80 sec with bariatric walker 3/19: 0.22 m/s (44.31 sec) Goal status: progressing     ASSESSMENT:  CLINICAL IMPRESSION: Pt performed well throughout therapy session. Session limited by patient late arrival to therapy treatment.  PT treatment focused on Improved functional strength and rotational movements with increased amplitude.  Patient demonstrating improved ability to perform sit to stand from standard seat height on this day.  Mild improvement in ADL management With turns.  Patient will continue to benefit from skilled PT to address gait, balance and strength deficits to reduce falls risk and optimize independence with mobility and ADL's.   OBJECTIVE IMPAIRMENTS: Abnormal gait, decreased activity tolerance, decreased balance, decreased endurance, decreased knowledge of use of DME, decreased mobility, difficulty walking, decreased strength, decreased safety awareness, impaired perceived functional ability, and improper body mechanics.   ACTIVITY LIMITATIONS: bending, sitting, standing, squatting, stairs, transfers, bed mobility, bathing, toileting, and locomotion level  PARTICIPATION LIMITATIONS:  Pt reports no  participation restrictions when prompted   PERSONAL FACTORS: 3+ comorbidities: HTN, depressed mood ( chart), T2DM  are also affecting patient's functional outcome.   REHAB POTENTIAL: Fair secondary to current functional level and progressive aspect of diagnosis   CLINICAL DECISION MAKING: Evolving/moderate complexity  EVALUATION COMPLEXITY: Moderate  PLAN:  PT FREQUENCY: 2x/week  PT DURATION: 12 weeks  PLANNED INTERVENTIONS: Therapeutic exercises, Therapeutic activity, Neuromuscular re-education, Balance training, Gait training, Patient/Family education, Self Care, Joint mobilization, Stair training, and Manual therapy   PLAN FOR NEXT SESSION:     Continue endurance and gait training for PD   Ble strengthening.  Grier Rocher PT, DPT  Physical Therapist - [place]  [facility]  3:23 PM 01/31/23

## 2023-02-02 ENCOUNTER — Ambulatory Visit: Payer: 59 | Admitting: Physical Therapy

## 2023-02-02 DIAGNOSIS — M6281 Muscle weakness (generalized): Secondary | ICD-10-CM

## 2023-02-02 DIAGNOSIS — R2689 Other abnormalities of gait and mobility: Secondary | ICD-10-CM

## 2023-02-02 DIAGNOSIS — R2681 Unsteadiness on feet: Secondary | ICD-10-CM

## 2023-02-02 DIAGNOSIS — R269 Unspecified abnormalities of gait and mobility: Secondary | ICD-10-CM

## 2023-02-02 DIAGNOSIS — R262 Difficulty in walking, not elsewhere classified: Secondary | ICD-10-CM

## 2023-02-02 DIAGNOSIS — R296 Repeated falls: Secondary | ICD-10-CM

## 2023-02-02 DIAGNOSIS — R293 Abnormal posture: Secondary | ICD-10-CM

## 2023-02-02 DIAGNOSIS — G20C Parkinsonism, unspecified: Secondary | ICD-10-CM

## 2023-02-02 NOTE — Therapy (Signed)
OUTPATIENT PHYSICAL THERAPY NEURO TREATMENT      Patient Name: Kirk Bennett. MRN: [id number] DOB:[birth year], 72 y.o., male Today's Date: 02/02/2023   PCP: Eustaquio Boyden, MD  REFERRING PROVIDER: Demetrio Lapping,  MD    END OF SESSION:  PT End of Session - 02/02/23 1630     Visit Number 28    Number of Visits 33    Date for PT Re-Evaluation 02/16/23    Progress Note Due on Visit 30    PT Start Time 1455    PT Stop Time 1518    PT Time Calculation (min) 23 min    Equipment Utilized During Treatment Gait belt    Activity Tolerance Patient tolerated treatment well    Behavior During Therapy WFL for tasks assessed/performed                 Past Medical History:  Diagnosis Date   Cataracts, bilateral 02/2011   and suspected glaucoma, to return for f/u, no diabetic retinopathy   CKD stage 3 due to type 2 diabetes mellitus (HCC) 2015   normal renal US, self referred to Dr Cherylann Ratel   Complex partial seizures (HCC) 1990   from surgery for R temporal arachnoid cyst s/p drainage, possible continued sz so changed to lamotrigine (Dr. 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 Tedd Sias endo --> 02/2016 decided to return to PCP for DM care   Past Surgical History:  Procedure Laterality Date   CARDIAC CATHETERIZATION  02/19/2009   No blockages (Dr. Christene Slates)   COLONOSCOPY  09/16/2005   hyperplastic polyps, rpt due 10 yrs    COLONOSCOPY WITH PROPOFOL N/A 12/11/2015   mult polyps, few TA, rpt 29yrs ([place])   COLONOSCOPY WITH PROPOFOL N/A 09/02/2021   TAs, HPs, int hem, distal ileal polyposis -  benign, rpt yrs([place], Thomas Hoff, DO)   corrective surgery amblyopia  08/23/1955   Cystectomy or meningioma removal brain  08/22/1988   (unclear)   Left knee surgery  08/22/1969   Torn ACL   REPLACEMENT TOTAL KNEE  11/20/2009   Left ([facility] Dr. 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   Irregular heart beat 07/19/2021   BPH (benign prostatic hyperplasia) 10/03/2020   Pain due to onychomycosis of toenails of both feet 06/18/2020   CKD stage 3 due to type 2 diabetes mellitus (HCC) 10/23/2019   Type 2 diabetes mellitus with diabetic neuropathy, unspecified (HCC) 10/21/2019   Impaired gait and mobility 10/14/2019   Postural dizziness with presyncope 10/13/2019   Ventricular premature complexes 10/13/2019   Urge urinary incontinence 09/26/2019   Depressed mood 09/26/2019   Advanced care planning/counseling discussion 09/22/2018   Weakness of both lower extremities 09/22/2018   History of hepatitis 01/05/2018   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   NAFLD (nonalcoholic fatty liver disease) 32/44/0102   Obesity, Class I, BMI 30.0-34.9 (see actual BMI) 09/18/2017   Polyarthralgia 02/15/2017   OSA (obstructive sleep apnea) 02/15/2017  Partial epilepsy with impairment of consciousness (HCC) 01/29/2016   Low vitamin B12 level 05/27/2015   Elevated PSA 05/27/2015   Vitamin D deficiency 03/02/2015   Other long term (current) drug therapy 03/12/2012   Encounter for general adult medical examination with abnormal findings 04/26/2011   TOTAL KNEE REPLACEMENT, LEFT, HX OF 04/07/2010   Type 2 diabetes mellitus with diabetic nephropathy (HCC) 03/30/2010   Hyperlipidemia associated with type 2 diabetes mellitus (HCC) 03/30/2010   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) 03/30/2010   Essential hypertension, benign 03/30/2010    ONSET DATE:  05/2021  REFERRING DIAG: G20.C (ICD-10-CM) - Parkinsonism, unspecified   THERAPY DIAG:  Abnormality of gait and mobility  Unsteadiness on feet  Abnormal posture  Other abnormalities of gait and mobility  Muscle weakness (generalized)  Repeated falls  Difficulty in walking, not elsewhere classified  Atypical parkinsonism  Rationale for Evaluation and Treatment: Rehabilitation  SUBJECTIVE:                                                                                                                                                                                             SUBJECTIVE STATEMENT:   Pt reports that he is doing well.  Arrived late to therapy session due to MD appointments on this day according to wife. Wife also relates that he will be starting HHPT and HHSLP in the near future due to continued difficulty with mobility and speech/cognitive deficits. Will let therapy team know when HHPT   Pt accompanied by: self and significant other  PERTINENT HISTORY: Diabetic neuropathy, atypical PD which MD thinks could be PSP, pt reports diagnosis is parkinson's disease.    PAIN:  Are you having pain? No  PRECAUTIONS: Fall  WEIGHT BEARING RESTRICTIONS: No  FALLS: Has patient fallen in last 6 months? Yes. Number of falls 1 fall when he slippe dand fell getting out of the shower.   LIVING ENVIRONMENT: Lives with: lives with their family and lives with their spouse Lives in: House/apartment Stairs: Yes: Internal: 13, 2 sets steps; on right going up and External: 5 steps; on right going up Has following equipment at home: Single point cane, Walker - 2 wheeled, shower chair, and elevated commode  PLOF: Independent with basic ADLs, Independent with household mobility with device, Independent with community mobility with device, and Needs assistance with homemaking  PATIENT GOALS: Lower and upper body strength as well as balance   OBJECTIVE:   DIAGNOSTIC FINDINGS: No  significant findings   COGNITION: Overall cognitive status:  A little flat but otherwise WNL    SENSATION: Not tested  COORDINATION: Not tested  EDEMA:  None   MUSCLE TONE: Not tested, if in supine may be good to assess if pt has rigidity    POSTURE: rounded shoulders, forward head, and increased thoracic kyphosis  LOWER EXTREMITY ROM:   WNL in seated, in standing pt unable to obtain full erect posture.    LOWER EXTREMITY MMT:    MMT Right Eval Left Eval  Hip flexion 4 4-  Hip extension    Hip abduction 4+ 4+  Hip adduction 4 4  Hip internal rotation    Hip external rotation    Knee flexion 4+ 4+  Knee extension 4+ 4+  Ankle dorsiflexion 4+ 4+  Ankle plantarflexion 5 5  Ankle inversion    Ankle eversion    (Blank rows = not tested) All tested in seated position   BED MOBILITY:  Not tested at eval   TRANSFERS: Assistive device utilized:  UE and cane    Sit to stand: CGA Stand to sit: CGA Chair to chair: CGA Floor:  Not indicated for testing     CURB: Not tested   STAIRS: Not tested at eval, to look at in future assessments    GAIT: Gait pattern: step through pattern, decreased stride length, knee flexed in stance- Right, knee flexed in stance- Left, shuffling, and trunk flexed Distance walked: 30 feet  Assistive device utilized: Single point cane Level of assistance: CGA Comments: uses walls and other ibjects in clinic for steadying   FUNCTIONAL TESTS:  5 times sit to stand:  2/14:73.7 sec with significant UE assist and unable to stand in full erect posture. 3/19: 36.76 with UEpushing from arm rests  Timed up and go (TUG): 2/14: 42.45 sec with SPC and with UE assist on STS transfer. 3/19 55.2 sec with RW and UE assist for chair transfer. 10 meter walk test: 2/14: 57.80 with bariatric walker. 3/19: 44.31sec  Berg Balance Scale: 33/56   PATIENT SURVEYS:  FOTO 2/14: 40. 3/19: 50 4/25: 59    TODAY'S TREATMENT: DATE:02/02/2023  Sit<>stand from  various heights with min assist from low height and use of rail to pull to standing from low transport WC.   Stepping over SPC to force increased step length with UE support on RW x 9 bil, then pt requesting increased step length.  Gait with RW 2 x 27ft and x 17ft with CGA for improved AD management and max cues for increased step length/reciprocal gait pattern.     PATIENT EDUCATION: Education details: Pt educated throughout session about proper posture and technique with exercises. Improved exercise technique, movement at target joints, use of target muscles after min to mod verbal, visual, tactile cues.   Person educated: Patient and Spouse Education method: Explanation Education comprehension: verbalized understanding  HOME EXERCISE PROGRAM: Access Code: [email or web address] URL: [email or web address] Date: 10/25/2022 Prepared by: Grier Rocher  Exercises - Seated Hip Abduction with Pelvic Floor Contraction and Resistance Loop  - 1 x daily - 7 x weekly - 3 sets - 10 reps - Sitting Knee Extension with Resistance  - 1 x daily - 7 x weekly - 3 sets - 10 reps - Standing Hip Flexor Stretch  - 1 x daily - 7 x weekly - 3 sets - 10 reps  GOALS: Goals reviewed with patient? Yes  SHORT TERM GOALS: Target date: 11/02/2022  Patient will be independent in home exercise program to improve strength/mobility for better functional independence with ADLs. Baseline: provided on 3/5 Goal status: progressing    LONG TERM GOALS: Target date:  12/28/2022  1.  Patient (> 23 years old) will complete five times sit to stand test in < 30 seconds indicating an increased LE strength and improved balance. Baseline: 2/14: 73.7 sec with significant UE assistance and not able to obtain full erect posture in standing. 3/19: 36.76 with UE to push from arm rest. 4/25: 30.8  Goal status: progressing   2.  Patient will increase FOTO score to equal to or greater than  47   to demonstrate statistically  significant improvement in mobility and quality of life.  Baseline:2/14: 40 3/19: 50  4/25: 58  Goal status: MET    3.  Patient will increase Berg Balance score by > 6 points to demonstrate decreased fall risk during functional activities. Baseline: 33/56 . 4/25: 29/56 Goal status: progressing    4.  Patient will reduce timed up and go to <25 seconds with LRAD to reduce fall risk and demonstrate improved transfer/gait ability. Baseline: 2/14: 42.45 with SPC and UE assist with stand. 3/19: 55.2 sec with HDRW  Goal status: progressing   5.  Patient will increase 10 meter walk test to >1.27m/s as to improve gait speed for better community ambulation and to reduce fall risk. Baseline: 57.80 sec with bariatric walker 3/19: 0.22 m/s (44.31 sec) Goal status: progressing     ASSESSMENT:  CLINICAL IMPRESSION: Pt performed well throughout therapy session. Session limited by patient late arrival to therapy treatment.  Gait training with RW and min assist on this day to force increased step length on BLE and improved safety in turns. Pt's wife reports that he will be starting HHPT in the near future per MD referral, will reassess HEP and need for continued outpatient services. At this time, Patient will continue to benefit from skilled PT to address gait, balance and strength deficits to reduce falls risk and optimize independence with mobility and ADL's.   OBJECTIVE IMPAIRMENTS: Abnormal gait, decreased activity tolerance, decreased balance, decreased endurance, decreased knowledge of use of DME, decreased mobility, difficulty walking, decreased strength, decreased safety awareness, impaired perceived functional ability, and improper body mechanics.   ACTIVITY LIMITATIONS: bending, sitting, standing, squatting, stairs, transfers, bed mobility, bathing, toileting, and locomotion level  PARTICIPATION LIMITATIONS:  Pt reports no participation restrictions when prompted   PERSONAL FACTORS: 3+  comorbidities: HTN, depressed mood ( chart), T2DM  are also affecting patient's functional outcome.   REHAB POTENTIAL: Fair secondary to current functional level and progressive aspect of diagnosis   CLINICAL DECISION MAKING: Evolving/moderate complexity  EVALUATION COMPLEXITY: Moderate  PLAN:  PT FREQUENCY: 2x/week  PT DURATION: 12 weeks  PLANNED INTERVENTIONS: Therapeutic exercises, Therapeutic activity, Neuromuscular re-education, Balance training, Gait training, Patient/Family education, Self Care, Joint mobilization, Stair training, and Manual therapy   PLAN FOR NEXT SESSION:     Continue endurance and gait training for PD   Ble strengthening. Re assess HEP.   Grier Rocher PT, DPT  Physical Therapist - [facility]  4:49 PM 02/02/23

## 2023-02-07 ENCOUNTER — Ambulatory Visit: Payer: 59

## 2023-02-07 DIAGNOSIS — R269 Unspecified abnormalities of gait and mobility: Secondary | ICD-10-CM

## 2023-02-07 DIAGNOSIS — M6281 Muscle weakness (generalized): Secondary | ICD-10-CM

## 2023-02-07 DIAGNOSIS — R296 Repeated falls: Secondary | ICD-10-CM

## 2023-02-07 DIAGNOSIS — R262 Difficulty in walking, not elsewhere classified: Secondary | ICD-10-CM

## 2023-02-07 DIAGNOSIS — R2681 Unsteadiness on feet: Secondary | ICD-10-CM

## 2023-02-07 DIAGNOSIS — R293 Abnormal posture: Secondary | ICD-10-CM

## 2023-02-07 DIAGNOSIS — G20C Parkinsonism, unspecified: Secondary | ICD-10-CM

## 2023-02-07 DIAGNOSIS — R2689 Other abnormalities of gait and mobility: Secondary | ICD-10-CM

## 2023-02-07 NOTE — Therapy (Signed)
OUTPATIENT PHYSICAL THERAPY NEURO TREATMENT   Patient Name: Kirk Bennett. MRN: [id number] DOB:01/14/1951, 72 y.o., male Today's Date: 02/07/2023   PCP: Eustaquio Boyden, MD  REFERRING PROVIDER: Demetrio Lapping,  MD    END OF SESSION:  PT End of Session - 02/07/23 1425     Visit Number 29    Number of Visits 33    Date for PT Re-Evaluation 02/16/23    Progress Note Due on Visit 30    PT Start Time 1430    PT Stop Time 1515    PT Time Calculation (min) 45 min    Equipment Utilized During Treatment Gait belt    Activity Tolerance Patient tolerated treatment well    Behavior During Therapy WFL for tasks assessed/performed               Past Medical History:  Diagnosis Date   Cataracts, bilateral 02/2011   and suspected glaucoma, to return for f/u, no diabetic retinopathy   CKD stage 3 due to type 2 diabetes mellitus (HCC) 2015   normal renal US, self referred to Dr Cherylann Ratel   Complex partial seizures (HCC) 1990   from surgery for R temporal arachnoid cyst s/p drainage, possible continued sz so changed to lamotrigine (Dr. 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 Tedd Sias endo --> 02/2016 decided to return to PCP for DM care   Past Surgical History:  Procedure Laterality Date   CARDIAC CATHETERIZATION  02/19/2009   No blockages (Dr. Christene Slates)   COLONOSCOPY  09/16/2005   hyperplastic polyps, rpt due 10 yrs    COLONOSCOPY WITH PROPOFOL N/A 12/11/2015   mult polyps, few TA, rpt 29yrs ([place])   COLONOSCOPY WITH PROPOFOL N/A 09/02/2021   TAs, HPs, int hem, distal ileal polyposis - benign, rpt  yrs([place], Thomas Hoff, DO)   corrective surgery amblyopia  08/23/1955   Cystectomy or meningioma removal brain  08/22/1988   (unclear)   Left knee surgery  08/22/1969   Torn ACL   REPLACEMENT TOTAL KNEE  11/20/2009   Left ([facility] Dr. 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   Irregular heart beat 07/19/2021   BPH (benign prostatic hyperplasia) 10/03/2020   Pain due to onychomycosis of toenails of both feet 06/18/2020   CKD stage 3 due to type 2 diabetes mellitus (HCC) 10/23/2019   Type 2 diabetes mellitus with diabetic neuropathy, unspecified (HCC) 10/21/2019   Impaired gait and mobility 10/14/2019   Postural dizziness with presyncope 10/13/2019   Ventricular premature complexes 10/13/2019   Urge urinary incontinence 09/26/2019   Depressed mood 09/26/2019   Advanced care planning/counseling discussion 09/22/2018   Weakness of both lower extremities 09/22/2018   History of hepatitis 01/05/2018   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   NAFLD (nonalcoholic fatty liver disease) 65/78/4696   Obesity, Class I, BMI 30.0-34.9 (see actual BMI) 09/18/2017   Polyarthralgia 02/15/2017   OSA (obstructive sleep apnea) 02/15/2017   Partial epilepsy with impairment  of consciousness (HCC) 01/29/2016   Low vitamin B12 level 05/27/2015   Elevated PSA 05/27/2015   Vitamin D deficiency 03/02/2015   Other long term (current) drug therapy 03/12/2012   Encounter for general adult medical examination with abnormal findings 04/26/2011   TOTAL KNEE REPLACEMENT, LEFT, HX OF 04/07/2010   Type 2 diabetes mellitus with diabetic nephropathy (HCC) 03/30/2010   Hyperlipidemia associated with type 2 diabetes mellitus (HCC) 03/30/2010   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) 03/30/2010   Essential hypertension, benign 03/30/2010    ONSET DATE: 05/2021  REFERRING DIAG:  G20.C (ICD-10-CM) - Parkinsonism, unspecified   THERAPY DIAG:  Abnormality of gait and mobility  Unsteadiness on feet  Abnormal posture  Other abnormalities of gait and mobility  Muscle weakness (generalized)  Repeated falls  Difficulty in walking, not elsewhere classified  Atypical parkinsonism  Rationale for Evaluation and Treatment: Rehabilitation  SUBJECTIVE:                                                                                                                                                                                             SUBJECTIVE STATEMENT:   Pt reports he is having a birthday this week, but unsure of any plans for celebrating.  Pt accompanied by: self and significant other  PERTINENT HISTORY: Diabetic neuropathy, atypical PD which MD thinks could be PSP, pt reports diagnosis is parkinson's disease.    PAIN:  Are you having pain? No  PRECAUTIONS: Fall  WEIGHT BEARING RESTRICTIONS: No  FALLS: Has patient fallen in last 6 months? Yes. Number of falls 1 fall when he slippe dand fell getting out of the shower.   LIVING ENVIRONMENT: Lives with: lives with their family and lives with their spouse Lives in: House/apartment Stairs: Yes: Internal: 13, 2 sets steps; on right going up and External: 5 steps; on right going up Has following equipment at home: Single point cane, Walker - 2 wheeled, shower chair, and elevated commode  PLOF: Independent with basic ADLs, Independent with household mobility with device, Independent with community mobility with device, and Needs assistance with homemaking  PATIENT GOALS: Lower and upper body strength as well as balance   OBJECTIVE:   DIAGNOSTIC FINDINGS: No significant findings   COGNITION: Overall cognitive status:  A little flat but otherwise WNL    SENSATION: Not tested  COORDINATION: Not tested   EDEMA:  None   MUSCLE TONE: Not tested, if in supine may be good to assess if pt has  rigidity    POSTURE: rounded shoulders, forward head, and increased thoracic kyphosis  LOWER EXTREMITY ROM:   WNL in  seated, in standing pt unable to obtain full erect posture.    LOWER EXTREMITY MMT:    MMT Right Eval Left Eval  Hip flexion 4 4-  Hip extension    Hip abduction 4+ 4+  Hip adduction 4 4  Hip internal rotation    Hip external rotation    Knee flexion 4+ 4+  Knee extension 4+ 4+  Ankle dorsiflexion 4+ 4+  Ankle plantarflexion 5 5  Ankle inversion    Ankle eversion    (Blank rows = not tested) All tested in seated position   BED MOBILITY:  Not tested at eval   TRANSFERS: Assistive device utilized:  UE and cane    Sit to stand: CGA Stand to sit: CGA Chair to chair: CGA Floor:  Not indicated for testing     CURB: Not tested   STAIRS: Not tested at eval, to look at in future assessments    GAIT: Gait pattern: step through pattern, decreased stride length, knee flexed in stance- Right, knee flexed in stance- Left, shuffling, and trunk flexed Distance walked: 30 feet  Assistive device utilized: Single point cane Level of assistance: CGA Comments: uses walls and other ibjects in clinic for steadying   FUNCTIONAL TESTS:  5 times sit to stand:  2/14:73.7 sec with significant UE assist and unable to stand in full erect posture. 3/19: 36.76 with UEpushing from arm rests  Timed up and go (TUG): 2/14: 42.45 sec with SPC and with UE assist on STS transfer. 3/19 55.2 sec with RW and UE assist for chair transfer. 10 meter walk test: 2/14: 57.80 with bariatric walker. 3/19: 44.31sec  Berg Balance Scale: 33/56   PATIENT SURVEYS:  FOTO 2/14: 40. 3/19: 50 4/25: 59    TODAY'S TREATMENT: DATE: 02/07/2023  TherEx:  Sit<>stand from various heights with min assist from low height and use of rail to pull to standing from low transport WC.   Stepping over SPC to force increased step length with UE support on RW x 9 bil, then pt requesting increased step  length.  Gait with RW 90 ft x2 with one seated rest break, CGA for improved AD management and max cues for increased step length/reciprocal gait pattern.   Backwards ambulation in // bars for increased hip extension for postural stability, x2 length of // bars  Lateral stepping in // bars for increased glute activation for gait mechanics, x2 length of // bars  Resisted backward steps with green serious steel band placed at lumbar/glute region for resistance and to force pt into more upright posture, 2x10 each LE  Standing hip extension in // bars, with B UE support, x10 each LE Standing hip abduction in // bars, with B UE support, x10 each LE Standing hamstring curls in // bars, with B UE support, x10 each LE Standing hip marches in // bars, with B UE support, x10 each LE Standing calf raises in // bars, with B UE support, x10 each LE    PATIENT EDUCATION: Education details: Pt educated throughout session about proper posture and technique with exercises. Improved exercise technique, movement at target joints, use of target muscles after min to mod verbal, visual, tactile cues.   Person educated: Patient and Spouse Education method: Explanation Education comprehension: verbalized understanding  HOME EXERCISE PROGRAM: Access Code: [email or web address] URL: [email or web address] Date: 10/25/2022 Prepared by: Grier Rocher  Exercises - Seated Hip Abduction with Pelvic Floor Contraction and Resistance Loop  - 1 x daily - 7 x weekly - 3 sets -  10 reps - Sitting Knee Extension with Resistance  - 1 x daily - 7 x weekly - 3 sets - 10 reps - Standing Hip Flexor Stretch  - 1 x daily - 7 x weekly - 3 sets - 10 reps  GOALS: Goals reviewed with patient? Yes  SHORT TERM GOALS: Target date: 11/02/2022  Patient will be independent in home exercise program to improve strength/mobility for better functional independence with ADLs. Baseline: provided on 3/5 Goal status: progressing     LONG TERM GOALS: Target date: 12/28/2022  1.  Patient (> 70 years old) will complete five times sit to stand test in < 30 seconds indicating an increased LE strength and improved balance. Baseline: 2/14: 73.7 sec with significant UE assistance and not able to obtain full erect posture in standing. 3/19: 36.76 with UE to push from arm rest. 4/25: 30.8  Goal status: progressing   2.  Patient will increase FOTO score to equal to or greater than  47   to demonstrate statistically significant improvement in mobility and quality of life.  Baseline:2/14: 40 3/19: 50  4/25: 58  Goal status: MET    3.  Patient will increase Berg Balance score by > 6 points to demonstrate decreased fall risk during functional activities. Baseline: 33/56 . 4/25: 29/56 Goal status: progressing    4.  Patient will reduce timed up and go to <25 seconds with LRAD to reduce fall risk and demonstrate improved transfer/gait ability. Baseline: 2/14: 42.45 with SPC and UE assist with stand. 3/19: 55.2 sec with HDRW  Goal status: progressing   5.  Patient will increase 10 meter walk test to >1.54m/s as to improve gait speed for better community ambulation and to reduce fall risk. Baseline: 57.80 sec with bariatric walker 3/19: 0.22 m/s (44.31 sec) Goal status: progressing     ASSESSMENT:  CLINICAL IMPRESSION:  Pt put forth good effort throughout the session today.  Pt continues to display difficulty with postural stability, noting forward flexed posture throughout the entirety of the session.  Pt given exercises to address this limitation, however pt unable to make significant progress at this time.  Pt also tends to recruit UT's for ambulation and was given verbal cuing to correct and he was able to.  Pt still requires consistent verbal cuing, as carryover is not expressed at this time.   Pt will continue to benefit from skilled therapy to address remaining deficits in order to improve overall QoL and return to PLOF.       OBJECTIVE IMPAIRMENTS: Abnormal gait, decreased activity tolerance, decreased balance, decreased endurance, decreased knowledge of use of DME, decreased mobility, difficulty walking, decreased strength, decreased safety awareness, impaired perceived functional ability, and improper body mechanics.   ACTIVITY LIMITATIONS: bending, sitting, standing, squatting, stairs, transfers, bed mobility, bathing, toileting, and locomotion level  PARTICIPATION LIMITATIONS:  Pt reports no participation restrictions when prompted   PERSONAL FACTORS: 3+ comorbidities: HTN, depressed mood ( chart), T2DM  are also affecting patient's functional outcome.   REHAB POTENTIAL: Fair secondary to current functional level and progressive aspect of diagnosis   CLINICAL DECISION MAKING: Evolving/moderate complexity  EVALUATION COMPLEXITY: Moderate  PLAN:  PT FREQUENCY: 2x/week  PT DURATION: 12 weeks  PLANNED INTERVENTIONS: Therapeutic exercises, Therapeutic activity, Neuromuscular re-education, Balance training, Gait training, Patient/Family education, Self Care, Joint mobilization, Stair training, and Manual therapy   PLAN FOR NEXT SESSION:    Continue endurance and gait training for PD   Ble strengthening. Re assess HEP.  Nolon Bussing, PT, DPT Physical Therapist - [facility]  02/07/23, 3:15 PM

## 2023-02-09 ENCOUNTER — Ambulatory Visit: Payer: 59 | Admitting: Physical Therapy

## 2023-02-14 ENCOUNTER — Ambulatory Visit: Payer: 59 | Admitting: Physical Therapy

## 2023-02-16 ENCOUNTER — Ambulatory Visit: Payer: 59 | Admitting: Physical Therapy

## 2023-03-15 ENCOUNTER — Ambulatory Visit: Payer: 59 | Admitting: Physical Therapy

## 2023-03-21 ENCOUNTER — Ambulatory Visit: Payer: 59 | Admitting: Family Medicine

## 2023-03-21 ENCOUNTER — Encounter: Payer: Self-pay | Admitting: Family Medicine

## 2023-03-21 VITALS — BP 138/64 | HR 85 | Temp 97.2°F | Ht 74.0 in | Wt 242.0 lb

## 2023-03-21 DIAGNOSIS — G20C Parkinsonism, unspecified: Secondary | ICD-10-CM | POA: Diagnosis not present

## 2023-03-21 DIAGNOSIS — R2689 Other abnormalities of gait and mobility: Secondary | ICD-10-CM

## 2023-03-21 DIAGNOSIS — Z7984 Long term (current) use of oral hypoglycemic drugs: Secondary | ICD-10-CM

## 2023-03-21 DIAGNOSIS — E1121 Type 2 diabetes mellitus with diabetic nephropathy: Secondary | ICD-10-CM | POA: Diagnosis not present

## 2023-03-21 DIAGNOSIS — E1140 Type 2 diabetes mellitus with diabetic neuropathy, unspecified: Secondary | ICD-10-CM

## 2023-03-21 DIAGNOSIS — G231 Progressive supranuclear ophthalmoplegia [Steele-Richardson-Olszewski]: Secondary | ICD-10-CM

## 2023-03-21 DIAGNOSIS — R29898 Other symptoms and signs involving the musculoskeletal system: Secondary | ICD-10-CM | POA: Diagnosis not present

## 2023-03-21 DIAGNOSIS — Z7985 Long-term (current) use of injectable non-insulin antidiabetic drugs: Secondary | ICD-10-CM

## 2023-03-21 LAB — POCT GLYCOSYLATED HEMOGLOBIN (HGB A1C): Hemoglobin A1C: 7.4 % — AB (ref 4.0–5.6)

## 2023-03-21 MED ORDER — FREESTYLE LIBRE 3 SENSOR MISC
6 refills | Status: DC
Start: 2023-03-21 — End: 2023-12-13

## 2023-03-21 MED ORDER — METFORMIN HCL 500 MG PO TABS
ORAL_TABLET | ORAL | 1 refills | Status: DC
Start: 2023-03-21 — End: 2023-07-19

## 2023-03-21 NOTE — Patient Instructions (Addendum)
Price out [employer] 3 continuous glucose monitor. Change sensor every 2 weeks.  Let me know name of glucometer brand you prefer and I will send in.  Return in 4 months for diabetes follow up visit. Labs next visit  We will refer you to foot doctor in [place].

## 2023-03-21 NOTE — Progress Notes (Unsigned)
Ph: [phone] Fax: [phone]   Patient ID: Kirk Bennett., male    DOB: 10/05/1950, 72 y.o.   MRN: [id number]  This visit was conducted in person.  BP 138/64   Pulse 85   Temp (!) 97.2 F (36.2 C) (Temporal)   Ht 6\' 2"  (1.88 m)   Wt 242 lb (109.8 kg)   SpO2 95%   BMI 31.07 kg/m    CC: 4 mo DM f/u visit  Subjective:   HPI: Kirk Bennett. is a 72 y.o. male presenting on 03/21/2023 for Medical Management of Chronic Issues (Here for 4 mo DM f/u. Pt accompanied by wife, Prudy Feeler. 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 Now on sinemet. Abnormal DatScan 04/2021. 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 Last outpatient PT rehab was 01/2023. Now receiving home health PT through [facility].   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 Saw new epileptologist last year Dr Devoria Glassing NP.   Overactive bladder with urgency and urge incontinence previously saw urology, s/p PTNS x12 with limited noted benefit. Now off medication, no longer seeing uro. He is not taking flomax. Uses condom cath due to ongoing incontinence.   DM - does regularly check sugars BID: 256 last night high, fasting 130-140. Compliant with antihyperglycemic regimen which includes: amaryl 4mg  daily, metformin 500/1000mg  daily, farxiga 10mg  daily and trulicity 0.75mg  weekly. No UTI or symptoms of yeast infection. He stopped trulicity last week because cousin was hospitalized with pancreatitis. Denies low sugars or hypoglycemic symptoms. Denies paresthesias, blurry vision. Last diabetic eye exam 05/2022. Glucometer brand: unsure. Last foot exam: DUE. DSME: completed remotely. Lab Results  Component Value Date   HGBA1C 7.4 (A) 03/21/2023   Diabetic Foot Exam - Simple   Simple Foot Form Diabetic Foot exam was performed with the  following findings: Yes 03/21/2023 11:45 AM  Visual Inspection No deformities, no ulcerations, no other skin breakdown bilaterally: Yes See comments: Yes Sensation Testing Intact to touch and monofilament testing bilaterally: Yes Pulse Check Posterior Tibialis and Dorsalis pulse intact bilaterally: Yes Comments No claudication Elongated nails.     Lab Results  Component Value Date   MICROALBUR <0.2 09/11/2015        Relevant past medical, surgical, family and social history reviewed and updated as indicated. Interim medical history since our last visit reviewed. Allergies and medications reviewed and updated. Outpatient Medications Prior to Visit  Medication Sig Dispense Refill   atorvastatin (LIPITOR) 20 MG tablet Take 1 tablet (20 mg total) by mouth daily. 90 tablet 4   Blood Glucose Monitoring Suppl DEVI 1 each by Does not apply route in the morning, at noon, and at bedtime. May substitute to any manufacturer covered by patient's insurance. 1 each 0   carbidopa-levodopa (SINEMET IR) 25-100 MG tablet PLEASE SEE ATTACHED FOR DETAILED DIRECTIONS     Cholecalciferol (VITAMIN D3) 1000 units CAPS Take 1,000 Units by mouth daily.     clotrimazole (LOTRIMIN AF) 1 % cream Apply 1 application topically 2 (two) times daily. 113 g 0   cyanocobalamin (VITAMIN B12) 1000 MCG tablet Take 1 tablet (1,000 mcg total) by mouth 2 (two) times a week.     dapagliflozin propanediol (FARXIGA) 10 MG TABS tablet Take 1 tablet (10 mg total) by mouth daily. 90 tablet 4   glimepiride (AMARYL) 4 MG tablet Take 1 tablet (4 mg total) by  mouth daily with breakfast. 90 tablet 4   glucose blood (ONE TOUCH ULTRA TEST) test strip Use to check sugar fasting in the AM and 2 hours after lunch or supper. Dx: E11.21 100 each 3   latanoprost (XALATAN) 0.005 % ophthalmic solution Place 1 drop into both eyes at bedtime.     levETIRAcetam (KEPPRA) 1000 MG tablet Take 1,000 mg by mouth 2 (two) times daily.     lisinopril  (ZESTRIL) 10 MG tablet Take 1 tablet (10 mg total) by mouth 2 (two) times daily. 180 tablet 4   Multiple Vitamin (MULTIVITAMIN ADULT) TABS Take 1 tablet by mouth daily.     Dulaglutide (TRULICITY) 0.75 MG/0.5ML SOPN Inject 0.75 mg into the skin once a week. 6 mL 4   metFORMIN (GLUCOPHAGE) 1000 MG tablet Take 0.5 tablets (500 mg total) by mouth daily with breakfast AND 1 tablet (1,000 mg total) daily with supper. 135 tablet 4   Facility-Administered Medications Prior to Visit  Medication Dose Route Frequency Provider Last Rate Last Admin   lidocaine (XYLOCAINE) 2 % jelly 1 application  1 application  Urethral Once Sondra Come, MD         Per HPI unless specifically indicated in ROS section below Review of Systems  Objective:  BP 138/64   Pulse 85   Temp (!) 97.2 F (36.2 C) (Temporal)   Ht 6\' 2"  (1.88 m)   Wt 242 lb (109.8 kg)   SpO2 95%   BMI 31.07 kg/m   Wt Readings from Last 3 Encounters:  03/21/23 242 lb (109.8 kg)  11/16/22 243 lb 6 oz (110.4 kg)  07/29/22 240 lb 6.4 oz (109 kg)      Physical Exam Vitals and nursing note reviewed.  Constitutional:      Appearance: Normal appearance. He is not ill-appearing.     Comments: Sitting in wheelchair  Eyes:     Extraocular Movements: Extraocular movements intact.     Conjunctiva/sclera: Conjunctivae normal.     Pupils: Pupils are equal, round, and reactive to light.  Cardiovascular:     Rate and Rhythm: Normal rate and regular rhythm.     Pulses: Normal pulses.     Heart sounds: Normal heart sounds. No murmur heard. Pulmonary:     Effort: Pulmonary effort is normal. No respiratory distress.     Breath sounds: Normal breath sounds. No wheezing, rhonchi or rales.  Musculoskeletal:     Right lower leg: No edema.     Left lower leg: No edema.     Comments: See HPI for foot exam if done  Skin:    General: Skin is warm and dry.     Findings: No rash.  Neurological:     Mental Status: He is alert.  Psychiatric:         Mood and Affect: Mood normal.        Behavior: Behavior normal.       Results for orders placed or performed in visit on 03/21/23  POCT glycosylated hemoglobin (Hb A1C)  Result Value Ref Range   Hemoglobin A1C 7.4 (A) 4.0 - 5.6 %   HbA1c POC (<> result, manual entry)     HbA1c, POC (prediabetic range)     HbA1c, POC (controlled diabetic range)     Lab Results  Component Value Date   NA 137 11/09/2022   CL 104 11/09/2022   K 4.1 11/09/2022   CO2 23 11/09/2022   BUN 21 11/09/2022   CREATININE 1.64 (H)  11/09/2022   GFR 42.34 (L) 11/09/2022   CALCIUM 9.3 11/09/2022   PHOS 3.7 11/09/2022   ALBUMIN 4.1 11/09/2022   GLUCOSE 192 (H) 11/09/2022   Assessment & Plan:   Problem List Items Addressed This Visit     Type 2 diabetes mellitus with diabetic nephropathy (HCC) - Primary    Chronic, adequate on amaryl, metformin, farxiga and trulicity. Consider increased trulicity dosing.  Having trouble with newest glucometer - asked to let me know name of preferred glucometer to refill. He would be interested in CGM - with commercial insurance it may be covered without needing to be on insulin. Freestyle Lib[employer] 3 sensors sent to pharmacy to price out.  Will refer to podiatrist in [place] to seablish for foot care      Relevant Medications   metFORMIN (GLUCOPHAGE) 500 MG tablet   Other Relevant Orders   POCT glycosylated hemoglobin (Hb A1C) (Completed)   Ambulatory referral to Podiatry   Weakness of both lower extremities    Ambulates with walker at home       Impaired gait and mobility    Ambulates with walker at home.  Has declined HH therapy.       Type 2 diabetes mellitus with diabetic neuropathy, unspecified (HCC)   Relevant Medications   metFORMIN (GLUCOPHAGE) 500 MG tablet   Atypical parkinsonism    Continues regularly seeing [facility] Dr Christell Constant.       PSP (progressive supranuclear palsy) (HCC)    Likely PSP per latest neurology evaluation.          Meds ordered this encounter  Medications   Continuous Glucose Sensor (FREESTYLE LIBRE 3 SENSOR) MISC    Sig: Place 1 sensor on the skin every 14 days. Use to check glucose continuously    Dispense:  2 each    Refill:  6   metFORMIN (GLUCOPHAGE) 500 MG tablet    Sig: Take 1 tablet (500 mg total) by mouth daily with breakfast AND 2 tablets (1,000 mg total) daily with supper.    Dispense:  270 tablet    Refill:  1    Orders Placed This Encounter  Procedures   Ambulatory referral to Podiatry    Referral Priority:   Routine    Referral Type:   Consultation    Referral Reason:   Specialty Services Required    Requested Specialty:   Podiatry    Number of Visits Requested:   1   POCT glycosylated hemoglobin (Hb A1C)    Patient Instructions  Price out freestyle libre 3 continuous glucose monitor. Change sensor every 2 weeks.  Let me know name of glucometer brand you prefer and I will send in.  Return in 4 months for diabetes follow up visit. Labs next visit  We will refer you to foot doctor in [place].   Follow up plan: Return in about 4 months (around 07/22/2023), or if symptoms worsen or fail to improve, for follow up visit.  Eustaquio Boyden, MD

## 2023-03-23 ENCOUNTER — Encounter: Payer: Self-pay | Admitting: Family Medicine

## 2023-03-23 DIAGNOSIS — G231 Progressive supranuclear ophthalmoplegia [Steele-Richardson-Olszewski]: Secondary | ICD-10-CM | POA: Insufficient documentation

## 2023-03-23 NOTE — Assessment & Plan Note (Signed)
Ambulates with walker at home

## 2023-03-23 NOTE — Assessment & Plan Note (Signed)
Chronic, adequate on amaryl, metformin, farxiga and trulicity. Consider increased trulicity dosing.  Having trouble with newest glucometer - asked to let me know name of preferred glucometer to refill. He would be interested in CGM - with commercial insurance it may be covered without needing to be on insulin. Freestyle Libre 3 sensors sent to pharmacy to price out.  Will refer to podiatrist in [place] to seablish for foot care

## 2023-03-23 NOTE — Assessment & Plan Note (Signed)
Continues regularly seeing [facility] Dr Christell Constant.

## 2023-03-23 NOTE — Assessment & Plan Note (Signed)
Likely PSP per latest neurology evaluation.

## 2023-03-23 NOTE — Assessment & Plan Note (Signed)
Ambulates with walker at home.  Has declined HH therapy.

## 2023-04-07 ENCOUNTER — Ambulatory Visit: Payer: 59 | Admitting: Physical Therapy

## 2023-04-10 ENCOUNTER — Ambulatory Visit: Payer: 59 | Admitting: Physical Therapy

## 2023-04-13 ENCOUNTER — Ambulatory Visit: Payer: 59 | Admitting: Physical Therapy

## 2023-04-15 ENCOUNTER — Encounter: Payer: Self-pay | Admitting: Family Medicine

## 2023-04-17 ENCOUNTER — Ambulatory Visit: Payer: 59 | Admitting: Physical Therapy

## 2023-04-19 ENCOUNTER — Ambulatory Visit: Payer: 59 | Admitting: Physical Therapy

## 2023-04-20 ENCOUNTER — Ambulatory Visit: Payer: 59 | Admitting: Podiatry

## 2023-04-20 DIAGNOSIS — E1121 Type 2 diabetes mellitus with diabetic nephropathy: Secondary | ICD-10-CM | POA: Diagnosis not present

## 2023-04-20 DIAGNOSIS — B351 Tinea unguium: Secondary | ICD-10-CM | POA: Diagnosis not present

## 2023-04-20 DIAGNOSIS — M79674 Pain in right toe(s): Secondary | ICD-10-CM

## 2023-04-20 DIAGNOSIS — M79675 Pain in left toe(s): Secondary | ICD-10-CM | POA: Diagnosis not present

## 2023-04-20 NOTE — Progress Notes (Signed)
This patient returns to my office for at risk foot care.  This patient requires this care by a professional since this patient will be at risk due to having  CKD and type 2 diabetes.  This patient is unable to cut nails himself since the patient cannot reach his nails.These nails are painful walking and wearing shoes.  This patient presents for at risk foot care today.  General Appearance  Alert, conversant and in no acute stress.  Vascular  Dorsalis pedis  are palpable  bilaterally. Posterior tibial pulses are absent. Capillary return is within normal limits  bilaterally. Temperature is within normal limits  bilaterally.  Neurologic  Senn-Weinstein monofilament wire test within normal limits  bilaterally. Muscle power within normal limits bilaterally.  Nails Thick disfigured discolored nails with subungual debris  from hallux to fifth toes bilaterally. No evidence of bacterial infection or drainage bilaterally.  Orthopedic  No limitations of motion  feet .  No crepitus or effusions noted.  No bony pathology or digital deformities noted.  Painful right swollen ankle. No pain or increased temperature or ROM right ankle.  Skin  normotropic skin with no porokeratosis noted bilaterally.  No signs of infections or ulcers noted.     Onychomycosis  Pain in right toes  Pain in left toes  Consent was obtained for treatment procedures.   Mechanical debridement of nails 1-5  bilaterally performed with a nail nipper.  Filed with dremel without incident.    Return office visit   3 months.                   Told patient to return for periodic foot care and evaluation due to potential at risk complications. Told him to be evaluated by one of the doctors in the office.   Nicholes Rough D.P.M.

## 2023-04-26 ENCOUNTER — Ambulatory Visit: Payer: 59 | Admitting: Physical Therapy

## 2023-04-28 ENCOUNTER — Ambulatory Visit: Payer: 59 | Admitting: Physical Therapy

## 2023-05-01 ENCOUNTER — Ambulatory Visit: Payer: 59 | Admitting: Physical Therapy

## 2023-05-03 ENCOUNTER — Ambulatory Visit: Payer: 59 | Admitting: Physical Therapy

## 2023-05-05 LAB — HM DIABETES EYE EXAM

## 2023-05-05 NOTE — Telephone Encounter (Signed)
Noted. Med list updated.

## 2023-05-08 ENCOUNTER — Ambulatory Visit: Payer: 59 | Admitting: Physical Therapy

## 2023-05-10 ENCOUNTER — Ambulatory Visit: Payer: 59 | Admitting: Physical Therapy

## 2023-05-15 ENCOUNTER — Ambulatory Visit: Payer: 59 | Admitting: Physical Therapy

## 2023-05-17 ENCOUNTER — Ambulatory Visit: Payer: 59 | Admitting: Physical Therapy

## 2023-05-22 ENCOUNTER — Ambulatory Visit: Payer: 59 | Admitting: Physical Therapy

## 2023-05-24 ENCOUNTER — Ambulatory Visit: Payer: 59 | Admitting: Physical Therapy

## 2023-05-29 ENCOUNTER — Ambulatory Visit: Payer: 59 | Admitting: Physical Therapy

## 2023-05-31 ENCOUNTER — Ambulatory Visit: Payer: 59 | Admitting: Physical Therapy

## 2023-06-01 ENCOUNTER — Encounter: Payer: Self-pay | Admitting: Family Medicine

## 2023-06-05 ENCOUNTER — Ambulatory Visit: Payer: 59 | Admitting: Physical Therapy

## 2023-06-07 ENCOUNTER — Ambulatory Visit: Payer: 59 | Admitting: Physical Therapy

## 2023-06-12 ENCOUNTER — Ambulatory Visit: Payer: 59 | Admitting: Physical Therapy

## 2023-06-14 ENCOUNTER — Ambulatory Visit: Payer: 59 | Admitting: Physical Therapy

## 2023-06-15 ENCOUNTER — Other Ambulatory Visit: Payer: Self-pay | Admitting: Family Medicine

## 2023-06-15 DIAGNOSIS — E785 Hyperlipidemia, unspecified: Secondary | ICD-10-CM

## 2023-06-15 NOTE — Telephone Encounter (Signed)
ERx 

## 2023-07-18 ENCOUNTER — Other Ambulatory Visit: Payer: Self-pay | Admitting: Family Medicine

## 2023-07-18 DIAGNOSIS — E1121 Type 2 diabetes mellitus with diabetic nephropathy: Secondary | ICD-10-CM

## 2023-07-26 ENCOUNTER — Encounter: Payer: Self-pay | Admitting: Family Medicine

## 2023-07-26 ENCOUNTER — Ambulatory Visit: Payer: 59 | Admitting: Family Medicine

## 2023-07-26 VITALS — BP 136/74 | HR 81 | Temp 97.7°F | Ht 74.0 in | Wt 239.4 lb

## 2023-07-26 DIAGNOSIS — G40109 Localization-related (focal) (partial) symptomatic epilepsy and epileptic syndromes with simple partial seizures, not intractable, without status epilepticus: Secondary | ICD-10-CM

## 2023-07-26 DIAGNOSIS — I10 Essential (primary) hypertension: Secondary | ICD-10-CM | POA: Diagnosis not present

## 2023-07-26 DIAGNOSIS — E1122 Type 2 diabetes mellitus with diabetic chronic kidney disease: Secondary | ICD-10-CM | POA: Diagnosis not present

## 2023-07-26 DIAGNOSIS — G20C Parkinsonism, unspecified: Secondary | ICD-10-CM

## 2023-07-26 DIAGNOSIS — Z23 Encounter for immunization: Secondary | ICD-10-CM

## 2023-07-26 DIAGNOSIS — E1140 Type 2 diabetes mellitus with diabetic neuropathy, unspecified: Secondary | ICD-10-CM

## 2023-07-26 DIAGNOSIS — G231 Progressive supranuclear ophthalmoplegia [Steele-Richardson-Olszewski]: Secondary | ICD-10-CM

## 2023-07-26 DIAGNOSIS — E1121 Type 2 diabetes mellitus with diabetic nephropathy: Secondary | ICD-10-CM | POA: Diagnosis not present

## 2023-07-26 DIAGNOSIS — N183 Chronic kidney disease, stage 3 unspecified: Secondary | ICD-10-CM

## 2023-07-26 DIAGNOSIS — N3941 Urge incontinence: Secondary | ICD-10-CM

## 2023-07-26 DIAGNOSIS — Z7984 Long term (current) use of oral hypoglycemic drugs: Secondary | ICD-10-CM

## 2023-07-26 LAB — POCT GLYCOSYLATED HEMOGLOBIN (HGB A1C): Hemoglobin A1C: 7.1 % — AB (ref 4.0–5.6)

## 2023-07-26 NOTE — Progress Notes (Signed)
Ph: [phone] Fax: [phone]   Patient ID: Kirk Coma., male    DOB: 1951/08/05, 72 y.o.   MRN: [id number]  This visit was conducted in person.  BP 136/74   Pulse 81   Temp 97.7 F (36.5 C) (Oral)   Ht 6\' 2"  (1.88 m)   Wt 239 lb 6 oz (108.6 kg)   SpO2 93%   BMI 30.73 kg/m    CC: 4 mo DM f/u visit  Subjective:   HPI: Kirk Bitz. is a 72 y.o. male presenting on 07/26/2023 for Medical Management of Chronic Issues (Here for 4 mo DM f/u. Pt accompanied by wife, Kirk Bennett. )   Completed HH earlier this year. 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 [facility].   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 Now on sinemet. Abnormal DatScan 04/2021. 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 Last outpatient PT rehab was 01/2023. Now receiving home health PT through [facility].    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    Overactive bladder with urgency and urge incontinence previously saw urology, s/p PTNS x12 with limited noted benefit. Now off medication, no longer seeing uro. He is not taking flomax. Uses condom cath due to ongoing incontinence.  [facility]. Restarted on flomax   DM - does regularly check sugars with CGM - forgot phone today. Compliant with antihyperglycemic regimen which includes: farxiga 10mg  daily, trulicity 0.75mg  weekly, amaryl 4mg  daily, metformin 500/1000mg  daily. Some delays with filling Trulicity - may miss a week at a time due to this. Due to insurance formulary change may have to change from [place] to [place] in the new year. Denies low sugars or hypoglycemic symptoms. Denies paresthesias, blurry vision.  Last diabetic eye exam done recently at [facility] 05/05/2023 - records requested. Glucometer brand: freestyle libre 3 - they forgot pt's phone at home to be able to review readings today. Last foot exam: 03/2023 (podiatrist Dr Allena Katz). DSME: completed remotely. Lab Results  Component Value Date   HGBA1C 7.1 (A) 07/26/2023   Diabetic Foot Exam - Simple   No data filed    Lab Results  Component Value Date   MICROALBUR <0.2 09/11/2015       Relevant past medical, surgical, family and social history reviewed and updated as indicated. Interim medical history since our last visit reviewed. Allergies and medications reviewed and updated. Outpatient Medications Prior to Visit  Medication Sig Dispense Refill   atorvastatin (LIPITOR) 20 MG tablet TAKE 1 TABLET BY MOUTH EVERY DAY 90 tablet 4   Blood Glucose Monitoring Suppl DEVI 1 each by Does not apply route in the morning, at noon, and at bedtime. May substitute to any manufacturer covered by patient's insurance. 1 each 0   carbidopa-levodopa (SINEMET IR) 25-100 MG tablet PLEASE SEE ATTACHED FOR DETAILED DIRECTIONS     Cholecalciferol (VITAMIN D3) 1000 units CAPS Take 1,000 Units by mouth daily.     clotrimazole (LOTRIMIN AF) 1 % cream Apply 1 application topically 2 (two) times daily. 113 g 0   Continuous Glucose Sensor (FREESTYLE LIBRE 3 SENSOR) MISC Place 1 sensor on the skin every 14 days. Use to check glucose continuously 2 each 6   cyanocobalamin (VITAMIN B12)  1000 MCG tablet Take 1 tablet (1,000 mcg total) by mouth 2 (two) times a week.     dapagliflozin propanediol (FARXIGA) 10 MG TABS tablet Take 1 tablet (10 mg total) by mouth daily. 90 tablet 4   Dulaglutide (TRULICITY) 0.75 MG/0.5ML SOPN Inject 0.75 mg into the skin once a week.     glimepiride (AMARYL) 4 MG tablet Take 1 tablet (4 mg total) by mouth daily with breakfast. 90 tablet 4   glucose blood (ONE TOUCH ULTRA TEST) test strip Use to check sugar fasting in the AM and 2 hours  after lunch or supper. Dx: E11.21 100 each 3   latanoprost (XALATAN) 0.005 % ophthalmic solution Place 1 drop into both eyes at bedtime.     levETIRAcetam (KEPPRA) 1000 MG tablet Take 1,000 mg by mouth 2 (two) times daily.     lisinopril (ZESTRIL) 10 MG tablet Take 1 tablet (10 mg total) by mouth 2 (two) times daily. 180 tablet 4   metFORMIN (GLUCOPHAGE) 500 MG tablet Take 1 tablet (500 mg total) by mouth daily with breakfast AND 2 tablets (1,000 mg total) daily with supper. 270 tablet 1   Multiple Vitamin (MULTIVITAMIN ADULT) TABS Take 1 tablet by mouth daily.     tamsulosin (FLOMAX) 0.4 MG CAPS capsule Take 1 capsule (0.4 mg total) by mouth daily after supper.     Facility-Administered Medications Prior to Visit  Medication Dose Route Frequency Provider Last Rate Last Admin   lidocaine (XYLOCAINE) 2 % jelly 1 application  1 application  Urethral Once Sondra Come, MD         Per HPI unless specifically indicated in ROS section below Review of Systems  Objective:  BP 136/74   Pulse 81   Temp 97.7 F (36.5 C) (Oral)   Ht 6\' 2"  (1.88 m)   Wt 239 lb 6 oz (108.6 kg)   SpO2 93%   BMI 30.73 kg/m   Wt Readings from Last 3 Encounters:  07/26/23 239 lb 6 oz (108.6 kg)  03/21/23 242 lb (109.8 kg)  11/16/22 243 lb 6 oz (110.4 kg)      Physical Exam Vitals and nursing note reviewed.  Constitutional:      Appearance: Normal appearance. He is not ill-appearing.     Comments: Sitting in transport chair  HENT:     Head: Normocephalic and atraumatic.     Mouth/Throat:     Mouth: Mucous membranes are moist.     Pharynx: Oropharynx is clear. No oropharyngeal exudate or posterior oropharyngeal erythema.  Eyes:     Extraocular Movements: Extraocular movements intact.     Conjunctiva/sclera: Conjunctivae normal.     Pupils: Pupils are equal, round, and reactive to light.  Cardiovascular:     Rate and Rhythm: Normal rate and regular rhythm.     Pulses: Normal pulses.     Heart sounds:  Normal heart sounds. No murmur heard. Pulmonary:     Effort: Pulmonary effort is normal. No respiratory distress.     Breath sounds: Normal breath sounds. No wheezing, rhonchi or rales.  Musculoskeletal:     Right lower leg: Edema (tr) present.     Left lower leg: Edema (tr) present.     Comments: See HPI for foot exam if done  Skin:    General: Skin is warm and dry.     Findings: No rash.  Neurological:     Mental Status: He is alert.  Psychiatric:        Mood  and Affect: Mood normal.        Behavior: Behavior normal.       Results for orders placed or performed in visit on 07/26/23  POCT glycosylated hemoglobin (Hb A1C)  Result Value Ref Range   Hemoglobin A1C 7.1 (A) 4.0 - 5.6 %   HbA1c POC (<> result, manual entry)     HbA1c, POC (prediabetic range)     HbA1c, POC (controlled diabetic range)     Lab Results  Component Value Date   NA 137 11/09/2022   CL 104 11/09/2022   K 4.1 11/09/2022   CO2 23 11/09/2022   BUN 21 11/09/2022   CREATININE 1.64 (H) 11/09/2022   GFR 42.34 (L) 11/09/2022   CALCIUM 9.3 11/09/2022   PHOS 3.7 11/09/2022   ALBUMIN 4.1 11/09/2022   GLUCOSE 192 (H) 11/09/2022  [facility] from 04/2023 reviewed - Cr 1.8, glu 225, A1c 8.5, BUN 23, WBC 6.1, Hgb 13.2, plt 197  Assessment & Plan:   Problem List Items Addressed This Visit     Type 2 diabetes mellitus with diabetic nephropathy (HCC) - Primary    Chronic, stable with recent A1c adequate at 7.1%. continue current regimen.  May need to change [place] due to insurance formulary change. Consider increasing Trulicity and dropping dose of glimepiride.  No changes made today.  Did not check kidney function pending nephrology appt tomorrow - will review Cr and see if change in metformin dosing indicated.       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       Essential hypertension, benign    Chronic, stable period on current regimen - continue this.       CKD stage 3 due to type 2 diabetes mellitus [facility])    See above  Last saw nephrology Dr Cherylann Ratel 2016      Urge urinary incontinence    [facility].  [facility]) followed by [facility] for moderate to severe overactive bladder, thought neurogenic bladder component.  Not significant benefit noted with Flomax use.  Myrbetriq unaffordable Gemtesa ineffective.  He declined InterStim placement at that time.       Relevant Medications   tamsulosin (FLOMAX) 0.4 MG CAPS capsule   Type 2 diabetes mellitus with diabetic neuropathy, unspecified (HCC)    Denies significant  difficulty with neuropathy symptoms.  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   Orders Placed This Encounter  Procedures   Flu Vaccine Trivalent High Dose (Fluad)   POCT glycosylated hemoglobin (Hb A1C)    Patient Instructions  Flu shot today  We will request latest diabetic eye exam from [facility].  A1c is doing ok at 7.1%. I will not check labs as you're seeing the kidney doctor tomorrow.  Good to see you today, return in 4 months for physical/wellness visit  Follow up plan: Return in about 4 months (around 11/24/2023) for annual exam, prior fasting for blood work, medicare wellness visit.  Eustaquio Boyden, MD

## 2023-07-26 NOTE — Patient Instructions (Addendum)
Flu shot today  We will request latest diabetic eye exam from [facility].  A1c is doing ok at 7.1%. I will not check labs as you're seeing the kidney doctor tomorrow.  Good to see you today, return in 4 months for physical/wellness visit

## 2023-07-26 NOTE — Assessment & Plan Note (Addendum)
[facility]  Previously saw [facility] urology [facility]) followed by [facility] for moderate to severe overactive bladder, thought neurogenic bladder component.  Not significant benefit noted with Flomax use.  Myrbetriq unaffordable Gemtesa ineffective.  He declined InterStim placement at that time.

## 2023-07-26 NOTE — Assessment & Plan Note (Signed)
Appreciate neurology care - continues Keppra 1000mg  BID with good effect.

## 2023-07-26 NOTE — Assessment & Plan Note (Addendum)
Chronic, stable with recent A1c adequate at 7.1%. continue current regimen.  May need to change from [place] due to insurance formulary change. Consider increasing Trulicity and dropping dose of glimepiride.  No changes made today.  Did not check kidney function pending nephrology appt tomorrow - will review Cr and see if change in metformin dosing indicated.

## 2023-07-26 NOTE — Assessment & Plan Note (Addendum)
See above  Last saw nephrology Dr Cherylann Ratel 2016

## 2023-07-26 NOTE — Assessment & Plan Note (Signed)
Chronic, stable period on current regimen - continue this.

## 2023-07-26 NOTE — Assessment & Plan Note (Signed)
Appreciate [facility] care

## 2023-07-26 NOTE — Assessment & Plan Note (Signed)
Denies significant  difficulty with neuropathy symptoms.  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

## 2023-09-20 ENCOUNTER — Other Ambulatory Visit: Payer: Self-pay | Admitting: Family Medicine

## 2023-09-21 NOTE — Telephone Encounter (Signed)
Please notify pt/wife that due to insurance formulary change, they will cover [place] not [place].  I have therefore sent Jardiance 25mg  daily which should be comparable to his current farxiga. Let me know if any trouble with this.

## 2023-09-21 NOTE — Telephone Encounter (Signed)
Message from pharmacy:  Alternative Requested:Due to a formulary change, consider a covered alt and evaluate if appropriate for your patients indication and treatment goals. Alternative suggested is Jardiance 25 mg tab.   Last OV:  07/26/23, 4 mo DM f/u Next OV:  11/27/23, CPE

## 2023-09-22 NOTE — Telephone Encounter (Signed)
Spoke with pt's wife, Prudy Feeler (on dpr), relaying Dr Timoteo Expose message. Verbalizes understanding and states they were already made aware.

## 2023-10-04 ENCOUNTER — Other Ambulatory Visit: Payer: Self-pay | Admitting: Family Medicine

## 2023-10-05 NOTE — Telephone Encounter (Signed)
Rx changed to Jardiance 25 mg daily in place of Farxiga (see 09/20/23 phn note). Jardiance #90/3 refills sent on 09/21/23 to [facility]  Request denied.

## 2023-10-06 ENCOUNTER — Encounter: Payer: Self-pay | Admitting: Family Medicine

## 2023-10-10 ENCOUNTER — Ambulatory Visit (INDEPENDENT_AMBULATORY_CARE_PROVIDER_SITE_OTHER): Payer: Medicare Other | Admitting: Podiatry

## 2023-10-10 DIAGNOSIS — M79674 Pain in right toe(s): Secondary | ICD-10-CM

## 2023-10-10 DIAGNOSIS — M2042 Other hammer toe(s) (acquired), left foot: Secondary | ICD-10-CM

## 2023-10-10 DIAGNOSIS — B351 Tinea unguium: Secondary | ICD-10-CM

## 2023-10-10 DIAGNOSIS — M2041 Other hammer toe(s) (acquired), right foot: Secondary | ICD-10-CM | POA: Diagnosis not present

## 2023-10-10 DIAGNOSIS — E1121 Type 2 diabetes mellitus with diabetic nephropathy: Secondary | ICD-10-CM

## 2023-10-10 DIAGNOSIS — M79675 Pain in left toe(s): Secondary | ICD-10-CM

## 2023-10-10 NOTE — Progress Notes (Signed)
 This patient returns to my office for at risk foot care.  This patient requires this care by a professional since this patient will be at risk due to having  CKD and type 2 diabetes.  This patient is unable to cut nails himself since the patient cannot reach his nails.These nails are painful walking and wearing shoes.  This patient presents for at risk foot care today.  General Appearance  Alert, conversant and in no acute stress.  Vascular  Dorsalis pedis  are palpable  bilaterally. Posterior tibial pulses are absent. Capillary return is within normal limits  bilaterally. Temperature is within normal limits  bilaterally.  Neurologic  Senn-Weinstein monofilament wire test within normal limits  bilaterally. Muscle power within normal limits bilaterally.  Nails Thick disfigured discolored nails with subungual debris  from hallux to fifth toes bilaterally. No evidence of bacterial infection or drainage bilaterally.  Orthopedic  No limitations of motion  feet .  No crepitus or effusions noted.  Painful right swollen ankle. No pain or increased temperature or ROM right ankle.  Bilateral hammertoe deformity noted.  Semiflexible in nature  Skin  normotropic skin with no porokeratosis noted bilaterally.  No signs of infections or ulcers noted.     Onychomycosis  Pain in right toes  Pain in left toes with bilateral hammertoe contractures  Consent was obtained for treatment procedures.   Mechanical debridement of nails 1-5  bilaterally performed with a nail nipper.  Filed with dremel without incident.  Bilateral hammertoe noted in setting of diabetes patient will benefit from diabetic shoes diabetic shoes were dispensed   Return office visit   3 months.                   Told patient to return for periodic foot care and evaluation due to potential at risk complications. Told him to be evaluated by one of the doctors in the office.   Nicholes Rough D.P.M.

## 2023-11-09 ENCOUNTER — Other Ambulatory Visit: Payer: Self-pay | Admitting: Family Medicine

## 2023-11-10 ENCOUNTER — Telehealth: Payer: Self-pay | Admitting: Family Medicine

## 2023-11-10 NOTE — Telephone Encounter (Signed)
 Patient dropped off document  medical record release  , to be filled out by provider. Patient requested to send it back via Call Patient to pick up within 7-days. Document is located in providers tray at front office.Please advise at Mobile [phone] (mobile)

## 2023-11-10 NOTE — Telephone Encounter (Signed)
 Change in therapy. Rx changed from [place] to [place] due to [place] not covered by insurance (see 09/20/23 refill note).   Request denied.

## 2023-11-10 NOTE — Telephone Encounter (Signed)
 Just checked Dr Timoteo Expose box didn't see pt's info.

## 2023-11-11 ENCOUNTER — Other Ambulatory Visit: Payer: Self-pay | Admitting: Family Medicine

## 2023-11-11 DIAGNOSIS — E1122 Type 2 diabetes mellitus with diabetic chronic kidney disease: Secondary | ICD-10-CM

## 2023-11-11 DIAGNOSIS — R972 Elevated prostate specific antigen [PSA]: Secondary | ICD-10-CM

## 2023-11-11 DIAGNOSIS — E559 Vitamin D deficiency, unspecified: Secondary | ICD-10-CM

## 2023-11-11 DIAGNOSIS — R7989 Other specified abnormal findings of blood chemistry: Secondary | ICD-10-CM

## 2023-11-11 DIAGNOSIS — E1169 Type 2 diabetes mellitus with other specified complication: Secondary | ICD-10-CM

## 2023-11-17 ENCOUNTER — Other Ambulatory Visit (INDEPENDENT_AMBULATORY_CARE_PROVIDER_SITE_OTHER): Payer: 59

## 2023-11-17 DIAGNOSIS — E1122 Type 2 diabetes mellitus with diabetic chronic kidney disease: Secondary | ICD-10-CM

## 2023-11-17 DIAGNOSIS — E785 Hyperlipidemia, unspecified: Secondary | ICD-10-CM | POA: Diagnosis not present

## 2023-11-17 DIAGNOSIS — E1169 Type 2 diabetes mellitus with other specified complication: Secondary | ICD-10-CM | POA: Diagnosis not present

## 2023-11-17 DIAGNOSIS — N183 Chronic kidney disease, stage 3 unspecified: Secondary | ICD-10-CM | POA: Diagnosis not present

## 2023-11-17 DIAGNOSIS — R7989 Other specified abnormal findings of blood chemistry: Secondary | ICD-10-CM

## 2023-11-17 DIAGNOSIS — E559 Vitamin D deficiency, unspecified: Secondary | ICD-10-CM | POA: Diagnosis not present

## 2023-11-17 DIAGNOSIS — R972 Elevated prostate specific antigen [PSA]: Secondary | ICD-10-CM | POA: Diagnosis not present

## 2023-11-17 LAB — COMPREHENSIVE METABOLIC PANEL WITH GFR
ALT: 4 U/L (ref 0–53)
AST: 11 U/L (ref 0–37)
Albumin: 4.2 g/dL (ref 3.5–5.2)
Alkaline Phosphatase: 98 U/L (ref 39–117)
BUN: 25 mg/dL — ABNORMAL HIGH (ref 6–23)
CO2: 26 meq/L (ref 19–32)
Calcium: 9.3 mg/dL (ref 8.4–10.5)
Chloride: 108 meq/L (ref 96–112)
Creatinine, Ser: 1.63 mg/dL — ABNORMAL HIGH (ref 0.40–1.50)
GFR: 42.35 mL/min — ABNORMAL LOW (ref >60.00)
Glucose, Bld: 182 mg/dL — ABNORMAL HIGH (ref 70–99)
Potassium: 4.1 meq/L (ref 3.5–5.1)
Sodium: 143 meq/L (ref 135–145)
Total Bilirubin: 0.7 mg/dL (ref 0.2–1.2)
Total Protein: 6.7 g/dL (ref 6.0–8.3)

## 2023-11-17 LAB — LIPID PANEL
Cholesterol: 129 mg/dL (ref 0–200)
HDL: 40.9 mg/dL (ref >39.00)
LDL Cholesterol: 72 mg/dL (ref 0–99)
NonHDL: 88.3
Total CHOL/HDL Ratio: 3
Triglycerides: 83 mg/dL (ref 0.0–149.0)
VLDL: 16.6 mg/dL (ref 0.0–40.0)

## 2023-11-17 LAB — CBC WITH DIFFERENTIAL/PLATELET
Basophils Absolute: 0 10*3/uL (ref 0.0–0.1)
Basophils Relative: 0.6 % (ref 0.0–3.0)
Eosinophils Absolute: 0.3 10*3/uL (ref 0.0–0.7)
Eosinophils Relative: 5.8 % — ABNORMAL HIGH (ref 0.0–5.0)
HCT: 43.9 % (ref 39.0–52.0)
Hemoglobin: 14.2 g/dL (ref 13.0–17.0)
Lymphocytes Relative: 36.4 % (ref 12.0–46.0)
Lymphs Abs: 2.1 10*3/uL (ref 0.7–4.0)
MCHC: 32.4 g/dL (ref 30.0–36.0)
MCV: 89.6 fl (ref 78.0–100.0)
Monocytes Absolute: 0.5 10*3/uL (ref 0.1–1.0)
Monocytes Relative: 8 % (ref 3.0–12.0)
Neutro Abs: 2.8 10*3/uL (ref 1.4–7.7)
Neutrophils Relative %: 49.2 % (ref 43.0–77.0)
Platelets: 177 10*3/uL (ref 150.0–400.0)
RBC: 4.9 Mil/uL (ref 4.22–5.81)
RDW: 13 % (ref 11.5–15.5)
WBC: 5.8 10*3/uL (ref 4.0–10.5)

## 2023-11-17 LAB — MICROALBUMIN / CREATININE URINE RATIO
Creatinine,U: 53.2 mg/dL
Microalb Creat Ratio: UNDETERMINED mg/g (ref 0.0–30.0)
Microalb, Ur: <0.7 mg/dL

## 2023-11-17 LAB — VITAMIN D 25 HYDROXY (VIT D DEFICIENCY, FRACTURES): VITD: 34.87 ng/mL (ref 30.00–100.00)

## 2023-11-17 LAB — PSA: PSA: 1.77 ng/mL (ref 0.10–4.00)

## 2023-11-17 LAB — PHOSPHORUS: Phosphorus: 3.9 mg/dL (ref 2.3–4.6)

## 2023-11-17 LAB — VITAMIN B12: Vitamin B-12: 947 pg/mL — ABNORMAL HIGH (ref 211–911)

## 2023-11-18 LAB — PARATHYROID HORMONE, INTACT (NO CA): PTH: 22 pg/mL (ref 16–77)

## 2023-11-18 LAB — HEMOGLOBIN A1C: Hgb A1c MFr Bld: 6.8 % — ABNORMAL HIGH (ref 4.6–6.5)

## 2023-11-21 ENCOUNTER — Other Ambulatory Visit: Payer: Self-pay | Admitting: Family Medicine

## 2023-11-22 NOTE — Telephone Encounter (Signed)
 ERx

## 2023-11-24 ENCOUNTER — Encounter: Payer: 59 | Admitting: Family Medicine

## 2023-11-27 ENCOUNTER — Ambulatory Visit: Payer: 59 | Admitting: Family Medicine

## 2023-11-27 ENCOUNTER — Encounter: Payer: Self-pay | Admitting: Family Medicine

## 2023-11-27 VITALS — BP 148/66 | HR 52 | Temp 98.3°F | Ht 71.0 in | Wt 248.1 lb

## 2023-11-27 DIAGNOSIS — R7989 Other specified abnormal findings of blood chemistry: Secondary | ICD-10-CM

## 2023-11-27 DIAGNOSIS — E1140 Type 2 diabetes mellitus with diabetic neuropathy, unspecified: Secondary | ICD-10-CM

## 2023-11-27 DIAGNOSIS — R2689 Other abnormalities of gait and mobility: Secondary | ICD-10-CM

## 2023-11-27 DIAGNOSIS — E1122 Type 2 diabetes mellitus with diabetic chronic kidney disease: Secondary | ICD-10-CM | POA: Diagnosis not present

## 2023-11-27 DIAGNOSIS — R600 Localized edema: Secondary | ICD-10-CM | POA: Insufficient documentation

## 2023-11-27 DIAGNOSIS — I10 Essential (primary) hypertension: Secondary | ICD-10-CM

## 2023-11-27 DIAGNOSIS — N183 Chronic kidney disease, stage 3 unspecified: Secondary | ICD-10-CM | POA: Diagnosis not present

## 2023-11-27 DIAGNOSIS — Z7984 Long term (current) use of oral hypoglycemic drugs: Secondary | ICD-10-CM

## 2023-11-27 DIAGNOSIS — R972 Elevated prostate specific antigen [PSA]: Secondary | ICD-10-CM

## 2023-11-27 DIAGNOSIS — R350 Frequency of micturition: Secondary | ICD-10-CM

## 2023-11-27 DIAGNOSIS — I499 Cardiac arrhythmia, unspecified: Secondary | ICD-10-CM | POA: Diagnosis not present

## 2023-11-27 DIAGNOSIS — E559 Vitamin D deficiency, unspecified: Secondary | ICD-10-CM

## 2023-11-27 DIAGNOSIS — G4733 Obstructive sleep apnea (adult) (pediatric): Secondary | ICD-10-CM

## 2023-11-27 DIAGNOSIS — Z0001 Encounter for general adult medical examination with abnormal findings: Secondary | ICD-10-CM | POA: Diagnosis not present

## 2023-11-27 DIAGNOSIS — E66811 Obesity, class 1: Secondary | ICD-10-CM

## 2023-11-27 DIAGNOSIS — G40109 Localization-related (focal) (partial) symptomatic epilepsy and epileptic syndromes with simple partial seizures, not intractable, without status epilepticus: Secondary | ICD-10-CM

## 2023-11-27 DIAGNOSIS — E785 Hyperlipidemia, unspecified: Secondary | ICD-10-CM

## 2023-11-27 DIAGNOSIS — Z7189 Other specified counseling: Secondary | ICD-10-CM

## 2023-11-27 DIAGNOSIS — E1121 Type 2 diabetes mellitus with diabetic nephropathy: Secondary | ICD-10-CM | POA: Diagnosis not present

## 2023-11-27 DIAGNOSIS — I444 Left anterior fascicular block: Secondary | ICD-10-CM

## 2023-11-27 DIAGNOSIS — G20C Parkinsonism, unspecified: Secondary | ICD-10-CM

## 2023-11-27 DIAGNOSIS — N401 Enlarged prostate with lower urinary tract symptoms: Secondary | ICD-10-CM

## 2023-11-27 DIAGNOSIS — K760 Fatty (change of) liver, not elsewhere classified: Secondary | ICD-10-CM

## 2023-11-27 DIAGNOSIS — I493 Ventricular premature depolarization: Secondary | ICD-10-CM

## 2023-11-27 DIAGNOSIS — G231 Progressive supranuclear ophthalmoplegia [Steele-Richardson-Olszewski]: Secondary | ICD-10-CM

## 2023-11-27 DIAGNOSIS — E1169 Type 2 diabetes mellitus with other specified complication: Secondary | ICD-10-CM

## 2023-11-27 DIAGNOSIS — N3941 Urge incontinence: Secondary | ICD-10-CM

## 2023-11-27 MED ORDER — LISINOPRIL 10 MG PO TABS: 10.0000 mg | ORAL_TABLET | Freq: Two times a day (BID) | ORAL | 4 refills | Status: AC

## 2023-11-27 MED ORDER — VITAMIN B-12 1000 MCG PO TABS: 1000.0000 ug | ORAL_TABLET | ORAL | Status: AC

## 2023-11-27 MED ORDER — GLIMEPIRIDE 4 MG PO TABS: 4.0000 mg | ORAL_TABLET | Freq: Every day | ORAL | 4 refills | Status: DC

## 2023-11-27 NOTE — Assessment & Plan Note (Signed)
 Chronic, continues flomax.  PSA stable.

## 2023-11-27 NOTE — Assessment & Plan Note (Signed)
 EKG today - bigeminy with PVCs.  Patient remains asymptomatic.  Will need TSH next labwork, will update echocardiogram.

## 2023-11-27 NOTE — Assessment & Plan Note (Addendum)
 Chronic, stable period with GFR 40s over the past several years. Non-microalbuminuric.  Last saw nephrology Cherylann Ratel) 2016

## 2023-11-27 NOTE — Assessment & Plan Note (Signed)
 Appreciate [facility], now on Sinemet.

## 2023-11-27 NOTE — Assessment & Plan Note (Addendum)
?  PSP. Now on sinemet, followed by [facility].

## 2023-11-27 NOTE — Progress Notes (Unsigned)
 Ph: [phone] Fax: [phone]   Patient ID: Kirk Coma., male    DOB: 12-10-1950, 73 y.o.   MRN: [id number]  This visit was conducted in person.  BP (!) 148/66 (BP Location: Right Arm, Cuff Size: Large)   Pulse (!) 52   Temp 98.3 F (36.8 C) (Oral)   Ht 5\' 11"  (1.803 m)   Wt 248 lb 2 oz (112.5 kg)   SpO2 93%   BMI 34.61 kg/m   BP Readings from Last 3 Encounters:  11/27/23 (!) 148/66  07/26/23 136/74  03/21/23 138/64   CC: CPE Subjective:   HPI: Kirk Bumgarner. is a 73 y.o. male presenting on 11/27/2023 for Annual Exam (Pt accompanied by wife, Prudy Feeler. )   Did not see health advisor this year.   No results found.  Flowsheet Row Office Visit from 11/27/2023 in [facility] [place]  PHQ-2 Total Score 2          11/27/2023    4:40 PM 07/26/2023   11:42 AM 03/21/2023   12:06 PM 11/16/2022   10:42 AM  Fall Risk   Falls in the past year? 1 1 1  0  Number falls in past yr: 1 1 1    Injury with Fall? 0 0 0    Bilateral foot swelling started last few days. No increased salt intake, drinking water well.   Overactive bladder with urgency and urge incontinence previously saw urology, s/p PTNS x12 with limited noted benefit. Now off medication. [facility] Marina Goodell, Juanell Fairly NP) - suggested CIC but was unable to do this. Continues using condom cath. Sounds like further testing is planned through the [place].   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 Saw new epileptologist last year Dr Devoria Glassing NP.    Preventative: COLONOSCOPY WITH PROPOFOL  12/11/2015 mult polyps, few TA, rpt 36yrs ([place]) - postponed due to pandemic COLONOSCOPY WITH PROPOFOL 09/02/2021 - TAs, HPs, int hem, distal ileal polyposis - benign, rpt 5 yrs Jaynie Collins, DO)  Prostate cancer screening - PSA increased to 4.4 last year, referred to urology with stable DRE and decreased PSA back to normal range in interim. Rec yearly monitoring at PCP's office.  Lung cancer screening - not eligible  Flu shot yearly  COVID vaccine Moderna 08/2019, 09/2019, Moderna booster 06/2020, 01/2021 Tdap - 04/2011 , 10/2022  Pneumovax 2013, prevnar-13 04/2018  Had hepatitis at age 7yo (?A), hospitalized [facility], [place].   No hep B in past.  Shingrix - declines  Advanced directive: scanned and in chart 03/2023. Does not want prolonged life support if terminal condition. Wife Kirk Bennett followed by daughter Kirk Bennett are HCPOA. According to previous discussions, full code, ok with feeding tube.  Seat belt use discussed.  Sunscreen use discussed. R groin mole enlarging.  Non smoker Alcohol - none  Dentist q6 mo  Eye exam yearly  Bowel - no constipation Bladder - ongoing urge incontinence - uses depends and condom cath. Occasional full uncontrollable emptying. No stress incontinence symptoms. Seeing urology as per above.   Caffeine - limited Lives with wife and granddaughter, 1 cat Occupation: retired, worked Theme park manager  Activity: has stationary bicycle at home  Diet: good water, good vegetables, follows diabetic diet      Relevant past medical, surgical, family and social history reviewed and updated as indicated. Interim medical history since our last visit reviewed. Allergies and medications reviewed and updated. Outpatient Medications Prior to Visit  Medication Sig Dispense Refill   atorvastatin (LIPITOR) 20 MG tablet TAKE 1 TABLET BY MOUTH EVERY DAY 90 tablet 4   Blood Glucose Monitoring Suppl DEVI 1 each by Does not apply route in the morning, at noon, and at bedtime.  May substitute to any manufacturer covered by patient's insurance. 1 each 0   carbidopa-levodopa (SINEMET IR) 25-100 MG tablet PLEASE SEE ATTACHED FOR DETAILED DIRECTIONS     Cholecalciferol (VITAMIN D3) 1000 units CAPS Take 1,000 Units by mouth daily.     clotrimazole (LOTRIMIN AF) 1 % cream Apply 1 application topically 2 (two) times daily. 113 g 0   Continuous Glucose Sensor (FREESTYLE LIBRE 3 SENSOR) MISC Place 1 sensor on the skin every 14 days. Use to check glucose continuously 2 each 6   Dulaglutide (TRULICITY) 0.75 MG/0.5ML SOAJ INJECT 0.75 MG SUBCUTANEOUSLY ONE TIME PER WEEK 2 mL 11   empagliflozin (JARDIANCE) 25 MG TABS tablet Take 1 tablet (25 mg total) by mouth daily. 90 tablet 3   glucose blood (ONE TOUCH ULTRA TEST) test strip Use to check sugar fasting in the AM and 2 hours after lunch or supper. Dx: E11.21 100 each 3   latanoprost (XALATAN) 0.005 % ophthalmic solution Place 1 drop into both eyes at bedtime.     levETIRAcetam (KEPPRA) 1000 MG tablet Take 1,000 mg by mouth 2 (two) times daily.     metFORMIN (GLUCOPHAGE) 500 MG tablet Take 1 tablet (500 mg total) by mouth daily with breakfast AND 2 tablets (1,000 mg total) daily with supper. 270 tablet 1   Multiple Vitamin (MULTIVITAMIN ADULT) TABS Take 1 tablet by mouth daily.     cyanocobalamin (VITAMIN B12) 1000 MCG tablet Take 1 tablet (1,000 mcg total) by mouth 2 (two) times a week.     glimepiride (AMARYL) 4 MG tablet Take 1 tablet (4 mg total) by mouth daily with breakfast. 90 tablet 4   lisinopril (ZESTRIL) 10 MG tablet Take 1 tablet (10 mg total) by mouth 2 (two) times daily. 180 tablet 4   tamsulosin (FLOMAX) 0.4 MG CAPS capsule Take 1 capsule (0.4 mg total) by mouth daily after supper.     tamsulosin (FLOMAX) 0.4 MG CAPS capsule Take 2 capsules (0.8 mg total) by mouth daily after supper.     Facility-Administered Medications Prior to Visit  Medication Dose Route Frequency Provider Last Rate Last Admin   lidocaine  (XYLOCAINE) 2 % jelly 1 application  1 application  Urethral Once Sondra Come, MD         Per HPI unless specifically indicated in ROS section below Review of Systems  Constitutional:  Negative for activity change, appetite change, chills, fatigue, fever and unexpected weight change.  HENT:  Negative for hearing loss.   Eyes:  Negative for visual disturbance.  Respiratory:  Negative for cough, chest tightness, shortness of breath and wheezing.   Cardiovascular:  Positive for leg swelling (B feet). Negative for chest pain and palpitations.  Gastrointestinal:  Negative for abdominal distention, abdominal pain, blood in stool, constipation, diarrhea, nausea and vomiting.  Genitourinary:  Negative for difficulty urinating and hematuria.  Musculoskeletal:  Negative for arthralgias, myalgias and neck pain.  Skin:  Negative for rash.  Neurological:  Negative for dizziness, seizures, syncope and headaches.  Hematological:  Negative for adenopathy. Does not bruise/bleed easily.  Psychiatric/Behavioral:  Negative for dysphoric mood. The patient is not nervous/anxious.     Objective:  BP (!) 148/66 (BP Location: Right Arm, Cuff Size: Large)   Pulse (!) 52   Temp 98.3 F (36.8 C) (Oral)   Ht 5\' 11"  (1.803 m)   Wt 248 lb 2 oz (112.5 kg)   SpO2 93%   BMI 34.61 kg/m   Wt Readings from Last 3 Encounters:  11/27/23 248 lb 2 oz (112.5 kg)  07/26/23 239 lb 6 oz (108.6 kg)  03/21/23 242 lb (109.8 kg)      Physical Exam Vitals and nursing note reviewed.  Constitutional:      General: He is not in acute distress.    Appearance: Normal appearance. He is well-developed. He is not ill-appearing.     Comments:  Sitting in transport chair Soft voice  HENT:     Head: Normocephalic and atraumatic.     Right Ear: Hearing, tympanic membrane, ear canal and external ear normal.     Left Ear: Hearing, tympanic membrane, ear canal and external ear normal.     Mouth/Throat:     Mouth: Mucous  membranes are moist.     Pharynx: Oropharynx is clear. No oropharyngeal exudate or posterior oropharyngeal erythema.  Eyes:     General: No scleral icterus.    Extraocular Movements: Extraocular movements intact.     Conjunctiva/sclera: Conjunctivae normal.     Pupils: Pupils are equal, round, and reactive to light.  Neck:     Thyroid: No thyroid mass or thyromegaly.     Vascular: No carotid bruit.  Cardiovascular:     Rate and Rhythm: Normal rate. Rhythm irregularly irregular.     Pulses: Normal pulses.          Radial pulses are 2+ on the right side and 2+ on the left side.     Heart sounds: Normal heart sounds. No murmur heard. Pulmonary:     Effort: Pulmonary effort is normal. No respiratory distress.     Breath sounds: Normal breath sounds. No wheezing, rhonchi or rales.  Abdominal:     General: Bowel sounds are normal. There is no distension.     Palpations: Abdomen is soft. There is no mass.     Tenderness: There is no abdominal tenderness. There is no guarding or rebound.     Hernia: No hernia is present.  Musculoskeletal:        General: Swelling (B feet) present. Normal range of motion.     Cervical back: Normal range of motion and neck supple.     Right lower leg: Edema present.     Left lower leg: Edema present.     Comments:  2+ pitting pedal edema bilateral feet to ankles 2+ DP bilaterally  Lymphadenopathy:     Cervical: No cervical adenopathy.  Skin:    General: Skin is warm and dry.     Findings: No rash.  Neurological:     General: No focal deficit present.     Mental Status: He is alert and oriented to person, place, and time.  Psychiatric:        Mood and Affect: Mood normal.        Behavior: Behavior normal.        Thought Content: Thought content normal.        Judgment: Judgment normal.  Results for orders placed or performed in visit on 11/17/23  PSA   Collection Time: 11/17/23  8:57 AM  Result Value Ref Range   PSA 1.77 0.10 - 4.00 ng/mL   Vitamin B12   Collection Time: 11/17/23  8:57 AM  Result Value Ref Range   Vitamin B-12 947 (H) 211 - 911 pg/mL  CBC with Differential/Platelet   Collection Time: 11/17/23  8:57 AM  Result Value Ref Range   WBC 5.8 4.0 - 10.5 K/uL   RBC 4.90 4.22 - 5.81 Mil/uL   Hemoglobin 14.2 13.0 - 17.0 g/dL   HCT 16.1 09.6 - 04.5 %   MCV 89.6 78.0 - 100.0 fl   MCHC 32.4 30.0 - 36.0 g/dL   RDW 40.9 81.1 - 91.4 %   Platelets 177.0 150.0 - 400.0 K/uL   Neutrophils Relative % 49.2 43.0 - 77.0 %   Lymphocytes Relative 36.4 12.0 - 46.0 %   Monocytes Relative 8.0 3.0 - 12.0 %   Eosinophils Relative 5.8 (H) 0.0 - 5.0 %   Basophils Relative 0.6 0.0 - 3.0 %   Neutro Abs 2.8 1.4 - 7.7 K/uL   Lymphs Abs 2.1 0.7 - 4.0 K/uL   Monocytes Absolute 0.5 0.1 - 1.0 K/uL   Eosinophils Absolute 0.3 0.0 - 0.7 K/uL   Basophils Absolute 0.0 0.0 - 0.1 K/uL  Parathyroid hormone, intact (no Ca)   Collection Time: 11/17/23  8:57 AM  Result Value Ref Range   PTH 22 16 - 77 pg/mL  Microalbumin / creatinine urine ratio   Collection Time: 11/17/23  8:57 AM  Result Value Ref Range   Microalb, Ur <0.7 mg/dL   Creatinine,U 78.2 mg/dL   Microalb Creat Ratio Unable to calculate 0.0 - 30.0 mg/g  VITAMIN D 25 Hydroxy (Vit-D Deficiency, Fractures)   Collection Time: 11/17/23  8:57 AM  Result Value Ref Range   VITD 34.87 30.00 - 100.00 ng/mL  Phosphorus   Collection Time: 11/17/23  8:57 AM  Result Value Ref Range   Phosphorus 3.9 2.3 - 4.6 mg/dL  Comprehensive metabolic panel   Collection Time: 11/17/23  8:57 AM  Result Value Ref Range   Sodium 143 135 - 145 mEq/L   Potassium 4.1 3.5 - 5.1 mEq/L   Chloride 108 96 - 112 mEq/L   CO2 26 19 - 32 mEq/L   Glucose, Bld 182 (H) 70 - 99 mg/dL   BUN 25 (H) 6 - 23 mg/dL   Creatinine, Ser 9.56 (H) 0.40 - 1.50 mg/dL   Total Bilirubin 0.7 0.2 - 1.2 mg/dL   Alkaline Phosphatase 98 39 - 117 U/L   AST 11 0 - 37 U/L   ALT 4 0 - 53 U/L   Total Protein 6.7 6.0 - 8.3 g/dL   Albumin  4.2 3.5 - 5.2 g/dL   GFR 21.30 (L) >86.57 mL/min   Calcium 9.3 8.4 - 10.5 mg/dL  Lipid panel   Collection Time: 11/17/23  8:57 AM  Result Value Ref Range   Cholesterol 129 0 - 200 mg/dL   Triglycerides 84.6 0.0 - 149.0 mg/dL   HDL 96.29 >52.84 mg/dL   VLDL 13.2 0.0 - 44.0 mg/dL   LDL Cholesterol 72 0 - 99 mg/dL   Total CHOL/HDL Ratio 3    NonHDL 88.30   Hemoglobin A1c   Collection Time: 11/17/23  8:57 AM  Result Value Ref Range   Hgb A1c MFr Bld 6.8 (H) 4.6 - 6.5 %   Lab Results  Component  Value Date   TSH 0.732 10/13/2019   EKG today - NSR rate 100, LAD with LAFB, ventricular bigeminy, unable to assess for ST/T changes   Assessment & Plan:   Problem List Items Addressed This Visit     Encounter for general adult medical examination with abnormal findings - Primary (Chronic)   Preventative protocols reviewed and updated unless pt declined. Discussed healthy diet and lifestyle.       Advanced care planning/counseling discussion (Chronic)   Advanced directive: scanned and in chart 03/2023. Does not want prolonged life support if terminal condition. Wife Kirk Bennett followed by daughter Kirk Bennett are HCPOA. According to previous discussions, full code, ok with feeding tube.       Type 2 diabetes mellitus with diabetic nephropathy (HCC)   Chronic, great control as evidenced by latest A1c - continue current regimen.  Finds jardiance and Trulicity are costly - wife will look into [place] pharmacy benefits.       Relevant Medications   glimepiride (AMARYL) 4 MG tablet   lisinopril (ZESTRIL) 10 MG tablet   Hyperlipidemia associated with type 2 diabetes mellitus (HCC)   Chronic, great control on atorvastatin 20mg  daily - continue The ASCVD Risk score (Arnett DK, et al., 2019) failed to calculate for the following reasons:   The valid total cholesterol range is 130 to 320 mg/dL       Relevant Medications   glimepiride (AMARYL) 4 MG tablet   lisinopril (ZESTRIL) 10 MG tablet   Essential  hypertension, benign   Chronic, BP above goal despite current regimen - see above re: irregular heart rate       Relevant Medications   lisinopril (ZESTRIL) 10 MG tablet   CKD stage 3 due to type 2 diabetes mellitus (HCC)   Chronic, stable period with GFR 40s over the past several years. Non-microalbuminuric.  Last saw nephrology Cherylann Ratel) 2016      Relevant Medications   glimepiride (AMARYL) 4 MG tablet   lisinopril (ZESTRIL) 10 MG tablet   Impaired gait and mobility   BPH (benign prostatic hyperplasia)   Chronic, continues flomax.  PSA stable.       Relevant Medications   tamsulosin (FLOMAX) 0.4 MG CAPS capsule   Irregular heart beat   EKG - bigeminy with PVCs.  Patient remains asymptomatic.  Will need TSH next labwork, will order echocardiogram       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HCC)   ?PSP. Now on sinemet, followed by [facility].       PSP (progressive supranuclear palsy) (HCC)   Appreciate [facility], now on Sinemet.      Pedal edema   Other Visit Diagnoses       Irregular heartbeat       Relevant Orders   EKG 12-Lead (Completed)        Meds ordered this encounter  Medications   cyanocobalamin (VITAMIN B12) 1000 MCG tablet    Sig: Take 1 tablet (1,000 mcg total) by mouth once a week.   glimepiride (AMARYL) 4 MG tablet    Sig: Take 1 tablet (4 mg total) by mouth daily with breakfast.    Dispense:  90 tablet    Refill:  4   lisinopril (ZESTRIL) 10 MG tablet    Sig: Take 1 tablet (10 mg total) by mouth 2 (two) times daily.    Dispense:  180 tablet    Refill:  4    Orders Placed This Encounter  Procedures   EKG 12-Lead  Patient Instructions  If interested, [facility] with [facility] or [facility] about new 2 shot shingles series (shingrix).  [facility] with [facility] if you can [facility] your medicines through them.  EKG today - showing ventricular bigeminy or frequent extra heart beats.  Return Thursday 9:15am for non fasting lab visit  only.  I will also order updated heart ultrasound in [place].  Good to see you today  Return in 3-4 months for follow up visit  Elevate legs, limit salt/sodium, ensure good water intake.   Follow up plan: Return in about 3 months (around 02/26/2024) for follow up visit.  Eustaquio Boyden, MD

## 2023-11-27 NOTE — Patient Instructions (Addendum)
 If interested, check with [facility] or [facility] about new 2 shot shingles series (shingrix).  [facility] if [facility] through [facility].  EKG today - showing ventricular bigeminy or frequent extra heart beats.  Return Thursday 9:15am for non fasting lab visit only.  I will also order updated heart ultrasound in [place].  Good to see you today  Return in 3-4 months for follow up visit  Elevate legs, limit salt/sodium, ensure good water intake.

## 2023-11-27 NOTE — Assessment & Plan Note (Signed)
 Preventative protocols reviewed and updated unless pt declined. Discussed healthy diet and lifestyle.

## 2023-11-27 NOTE — Assessment & Plan Note (Signed)
 Chronic, great control on atorvastatin 20mg  daily - continue. The ASCVD Risk score (Arnett DK, et al., 2019) failed to calculate for the following reasons:   The valid total cholesterol range is 130 to 320 mg/dL

## 2023-11-27 NOTE — Assessment & Plan Note (Addendum)
 Chronic, great control as evidenced by latest A1c - continue current regimen.  Finds jardiance and Trulicity are costly - wife will look into [place] pharmacy benefits.

## 2023-11-27 NOTE — Assessment & Plan Note (Addendum)
 Advanced directive: scanned and in chart 03/2023. Does not want prolonged life support if terminal condition. Wife Ora followed by daughter Calyn Rubi are HCPOA. According to previous discussions, full code, ok with feeding tube.

## 2023-11-27 NOTE — Assessment & Plan Note (Signed)
 Chronic, BP above goal despite current regimen - see above re: irregular heart rate

## 2023-11-29 NOTE — Assessment & Plan Note (Signed)
Continue daily replacement 

## 2023-11-29 NOTE — Assessment & Plan Note (Signed)
 Levels high normal - continue b12 once weekly

## 2023-11-29 NOTE — Assessment & Plan Note (Addendum)
 Activity limited due to neurological condition. Encourage healthy diet choices.

## 2023-11-29 NOTE — Assessment & Plan Note (Addendum)
 Thought severe OAB with component of neurogenic bladder [facility] - sounds like he may be scheduled for urodynamic studies. Previously poor response to treatments tried - gemtesa and flomax ineffective, myrbetriq unaffordable.  Previously declined interstim placement.

## 2023-11-29 NOTE — Assessment & Plan Note (Signed)
 Newly noted. No changes in salt intake. Return later this week for BNP and TSH. Recheck kidney function at that time.  Recommend leg elevation.  Consider starting diuretic pending labs on Thursday.

## 2023-11-29 NOTE — Assessment & Plan Note (Signed)
 Ambulates with walker at home. Uses transport chair in office.

## 2023-11-29 NOTE — Assessment & Plan Note (Signed)
PSA remains normal.

## 2023-11-29 NOTE — Assessment & Plan Note (Signed)
 Appreciate neurology care on Keppra 1000mg  BID.

## 2023-11-29 NOTE — Assessment & Plan Note (Signed)
 H/o this, unable to tolerate CPAP. Consider oral appliance through dentist

## 2023-11-30 ENCOUNTER — Other Ambulatory Visit

## 2023-11-30 DIAGNOSIS — N183 Chronic kidney disease, stage 3 unspecified: Secondary | ICD-10-CM

## 2023-11-30 DIAGNOSIS — E1122 Type 2 diabetes mellitus with diabetic chronic kidney disease: Secondary | ICD-10-CM

## 2023-11-30 DIAGNOSIS — R600 Localized edema: Secondary | ICD-10-CM | POA: Diagnosis not present

## 2023-11-30 DIAGNOSIS — I499 Cardiac arrhythmia, unspecified: Secondary | ICD-10-CM

## 2023-11-30 LAB — BASIC METABOLIC PANEL WITH GFR
BUN: 21 mg/dL (ref 6–23)
CO2: 24 meq/L (ref 19–32)
Calcium: 9.2 mg/dL (ref 8.4–10.5)
Chloride: 106 meq/L (ref 96–112)
Creatinine, Ser: 1.58 mg/dL — ABNORMAL HIGH (ref 0.40–1.50)
GFR: 43.95 mL/min — ABNORMAL LOW (ref >60.00)
Glucose, Bld: 184 mg/dL — ABNORMAL HIGH (ref 70–99)
Potassium: 4 meq/L (ref 3.5–5.1)
Sodium: 140 meq/L (ref 135–145)

## 2023-11-30 LAB — TSH: TSH: 1.95 u[IU]/mL (ref 0.35–5.50)

## 2023-11-30 LAB — BRAIN NATRIURETIC PEPTIDE: Pro B Natriuretic peptide (BNP): 62 pg/mL (ref 0.0–100.0)

## 2023-12-08 ENCOUNTER — Other Ambulatory Visit: Payer: Medicare Other

## 2023-12-13 ENCOUNTER — Other Ambulatory Visit: Payer: Self-pay | Admitting: Family Medicine

## 2023-12-13 DIAGNOSIS — E1121 Type 2 diabetes mellitus with diabetic nephropathy: Secondary | ICD-10-CM

## 2023-12-22 ENCOUNTER — Other Ambulatory Visit

## 2023-12-28 ENCOUNTER — Ambulatory Visit: Attending: Family Medicine

## 2023-12-28 DIAGNOSIS — R600 Localized edema: Secondary | ICD-10-CM

## 2023-12-28 DIAGNOSIS — I499 Cardiac arrhythmia, unspecified: Secondary | ICD-10-CM

## 2023-12-28 LAB — ECHOCARDIOGRAM COMPLETE

## 2024-01-03 ENCOUNTER — Ambulatory Visit: Payer: Self-pay | Admitting: Family Medicine

## 2024-01-03 DIAGNOSIS — I444 Left anterior fascicular block: Secondary | ICD-10-CM

## 2024-05-07 ENCOUNTER — Inpatient Hospital Stay
Admission: EM | Admit: 2024-05-07 | Discharge: 2024-05-16 | DRG: 871 | Disposition: A | Discharge status: Skilled Nursing Facility | Attending: Student | Admitting: Student

## 2024-05-07 ENCOUNTER — Emergency Department

## 2024-05-07 DIAGNOSIS — N1832 Chronic kidney disease, stage 3b: Secondary | ICD-10-CM | POA: Diagnosis present

## 2024-05-07 DIAGNOSIS — K567 Ileus, unspecified: Secondary | ICD-10-CM | POA: Diagnosis present

## 2024-05-07 DIAGNOSIS — Z818 Family history of other mental and behavioral disorders: Secondary | ICD-10-CM

## 2024-05-07 DIAGNOSIS — Z7984 Long term (current) use of oral hypoglycemic drugs: Secondary | ICD-10-CM

## 2024-05-07 DIAGNOSIS — N179 Acute kidney failure, unspecified: Secondary | ICD-10-CM | POA: Diagnosis present

## 2024-05-07 DIAGNOSIS — Z833 Family history of diabetes mellitus: Secondary | ICD-10-CM

## 2024-05-07 DIAGNOSIS — G40209 Localization-related (focal) (partial) symptomatic epilepsy and epileptic syndromes with complex partial seizures, not intractable, without status epilepticus: Secondary | ICD-10-CM | POA: Diagnosis present

## 2024-05-07 DIAGNOSIS — Z7985 Long-term (current) use of injectable non-insulin antidiabetic drugs: Secondary | ICD-10-CM

## 2024-05-07 DIAGNOSIS — E1122 Type 2 diabetes mellitus with diabetic chronic kidney disease: Secondary | ICD-10-CM | POA: Diagnosis present

## 2024-05-07 DIAGNOSIS — Z794 Long term (current) use of insulin: Secondary | ICD-10-CM | POA: Diagnosis not present

## 2024-05-07 DIAGNOSIS — G20A1 Parkinson's disease without dyskinesia, without mention of fluctuations: Secondary | ICD-10-CM | POA: Diagnosis present

## 2024-05-07 DIAGNOSIS — E1140 Type 2 diabetes mellitus with diabetic neuropathy, unspecified: Secondary | ICD-10-CM | POA: Diagnosis present

## 2024-05-07 DIAGNOSIS — E559 Vitamin D deficiency, unspecified: Secondary | ICD-10-CM | POA: Diagnosis present

## 2024-05-07 DIAGNOSIS — I10 Essential (primary) hypertension: Secondary | ICD-10-CM | POA: Diagnosis present

## 2024-05-07 DIAGNOSIS — J189 Pneumonia, unspecified organism: Secondary | ICD-10-CM | POA: Diagnosis present

## 2024-05-07 DIAGNOSIS — E1121 Type 2 diabetes mellitus with diabetic nephropathy: Secondary | ICD-10-CM | POA: Diagnosis present

## 2024-05-07 DIAGNOSIS — I129 Hypertensive chronic kidney disease with stage 1 through stage 4 chronic kidney disease, or unspecified chronic kidney disease: Secondary | ICD-10-CM | POA: Diagnosis present

## 2024-05-07 DIAGNOSIS — E876 Hypokalemia: Secondary | ICD-10-CM | POA: Diagnosis not present

## 2024-05-07 DIAGNOSIS — G4733 Obstructive sleep apnea (adult) (pediatric): Secondary | ICD-10-CM | POA: Diagnosis present

## 2024-05-07 DIAGNOSIS — R4182 Altered mental status, unspecified: Secondary | ICD-10-CM | POA: Diagnosis present

## 2024-05-07 DIAGNOSIS — N400 Enlarged prostate without lower urinary tract symptoms: Secondary | ICD-10-CM | POA: Diagnosis present

## 2024-05-07 DIAGNOSIS — Z96652 Presence of left artificial knee joint: Secondary | ICD-10-CM | POA: Diagnosis present

## 2024-05-07 DIAGNOSIS — A419 Sepsis, unspecified organism: Secondary | ICD-10-CM | POA: Diagnosis present

## 2024-05-07 DIAGNOSIS — U071 COVID-19: Principal | ICD-10-CM | POA: Diagnosis present

## 2024-05-07 DIAGNOSIS — Z888 Allergy status to other drugs, medicaments and biological substances status: Secondary | ICD-10-CM

## 2024-05-07 DIAGNOSIS — Z8619 Personal history of other infectious and parasitic diseases: Secondary | ICD-10-CM

## 2024-05-07 DIAGNOSIS — G20C Parkinsonism, unspecified: Secondary | ICD-10-CM | POA: Diagnosis present

## 2024-05-07 DIAGNOSIS — Z8249 Family history of ischemic heart disease and other diseases of the circulatory system: Secondary | ICD-10-CM | POA: Diagnosis not present

## 2024-05-07 DIAGNOSIS — J159 Unspecified bacterial pneumonia: Secondary | ICD-10-CM | POA: Diagnosis present

## 2024-05-07 DIAGNOSIS — E785 Hyperlipidemia, unspecified: Secondary | ICD-10-CM | POA: Diagnosis present

## 2024-05-07 DIAGNOSIS — Z79899 Other long term (current) drug therapy: Secondary | ICD-10-CM

## 2024-05-07 DIAGNOSIS — J1282 Pneumonia due to coronavirus disease 2019: Secondary | ICD-10-CM | POA: Diagnosis present

## 2024-05-07 LAB — CBC WITH DIFFERENTIAL/PLATELET
Abs Immature Granulocytes: 0.03 K/uL (ref 0.00–0.07)
Basophils Absolute: 0 K/uL (ref 0.0–0.1)
Basophils Relative: 0 %
Eosinophils Absolute: 0.1 K/uL (ref 0.0–0.5)
Eosinophils Relative: 1 %
HCT: 42.7 % (ref 39.0–52.0)
Hemoglobin: 13.7 g/dL (ref 13.0–17.0)
Immature Granulocytes: 0 %
Lymphocytes Relative: 19 %
Lymphs Abs: 1.5 K/uL (ref 0.7–4.0)
MCH: 28.3 pg (ref 26.0–34.0)
MCHC: 32.1 g/dL (ref 30.0–36.0)
MCV: 88.2 fL (ref 80.0–100.0)
Monocytes Absolute: 1 K/uL (ref 0.1–1.0)
Monocytes Relative: 14 %
Neutro Abs: 5 K/uL (ref 1.7–7.7)
Neutrophils Relative %: 66 %
Platelets: 134 K/uL — ABNORMAL LOW (ref 150–400)
RBC: 4.84 MIL/uL (ref 4.22–5.81)
RDW: 11.8 % (ref 11.5–15.5)
WBC: 7.6 K/uL (ref 4.0–10.5)
nRBC: 0 % (ref 0.0–0.2)

## 2024-05-07 LAB — COMPREHENSIVE METABOLIC PANEL WITH GFR
ALT: 13 U/L (ref 0–44)
AST: 16 U/L (ref 15–41)
Albumin: 3.9 g/dL (ref 3.5–5.0)
Alkaline Phosphatase: 85 U/L (ref 38–126)
Anion gap: 11 (ref 5–15)
BUN: 20 mg/dL (ref 8–23)
CO2: 26 mmol/L (ref 22–32)
Calcium: 9.4 mg/dL (ref 8.9–10.3)
Chloride: 103 mmol/L (ref 98–111)
Creatinine, Ser: 1.68 mg/dL — ABNORMAL HIGH (ref 0.61–1.24)
GFR, Estimated: 43 mL/min — ABNORMAL LOW (ref >60)
Glucose, Bld: 213 mg/dL — ABNORMAL HIGH (ref 70–99)
Potassium: 4.4 mmol/L (ref 3.5–5.1)
Sodium: 140 mmol/L (ref 135–145)
Total Bilirubin: 1.3 mg/dL — ABNORMAL HIGH (ref 0.0–1.2)
Total Protein: 7.6 g/dL (ref 6.5–8.1)

## 2024-05-07 LAB — RESP PANEL BY RT-PCR (RSV, FLU A&B, COVID)  RVPGX2
Influenza A by PCR: NEGATIVE
Influenza B by PCR: NEGATIVE
Resp Syncytial Virus by PCR: NEGATIVE
SARS Coronavirus 2 by RT PCR: POSITIVE — AB

## 2024-05-07 LAB — URINALYSIS, W/ REFLEX TO CULTURE (INFECTION SUSPECTED)
Bilirubin Urine: NEGATIVE
Glucose, UA: >=500 mg/dL — AB
Ketones, ur: 5 mg/dL — AB
Leukocytes,Ua: NEGATIVE
Nitrite: NEGATIVE
Protein, ur: NEGATIVE mg/dL
Specific Gravity, Urine: 1.015 (ref 1.005–1.030)
pH: 5 (ref 5.0–8.0)

## 2024-05-07 LAB — LACTIC ACID, PLASMA: Lactic Acid, Venous: 1.4 mmol/L (ref 0.5–1.9)

## 2024-05-07 LAB — CBG MONITORING, ED: Glucose-Capillary: 225 mg/dL — ABNORMAL HIGH (ref 70–99)

## 2024-05-07 LAB — GLUCOSE, CAPILLARY: Glucose-Capillary: 201 mg/dL — ABNORMAL HIGH (ref 70–99)

## 2024-05-07 MED ORDER — SODIUM CHLORIDE 0.9 % IV SOLN
2.0000 g | Freq: Once | INTRAVENOUS | Status: AC
  Administered 2024-05-07: 2 g via INTRAVENOUS
  Filled 2024-05-07: qty 12.5

## 2024-05-07 MED ORDER — ACETAMINOPHEN 325 MG PO TABS: 650.0000 mg | ORAL_TABLET | Freq: Once | ORAL | Status: DC

## 2024-05-07 MED ORDER — ATORVASTATIN CALCIUM 20 MG PO TABS
20.0000 mg | ORAL_TABLET | Freq: Every day | ORAL | Status: DC
Start: 2024-05-07 — End: 2024-05-17
  Administered 2024-05-07 – 2024-05-16 (×10): 20 mg via ORAL
  Filled 2024-05-07 (×10): qty 1

## 2024-05-07 MED ORDER — METRONIDAZOLE 500 MG/100ML IV SOLN
500.0000 mg | Freq: Once | INTRAVENOUS | Status: AC
  Administered 2024-05-07: 500 mg via INTRAVENOUS
  Filled 2024-05-07: qty 100

## 2024-05-07 MED ORDER — ACETAMINOPHEN 10 MG/ML IV SOLN: 1000.0000 mg | Freq: Once | INTRAVENOUS | Status: DC

## 2024-05-07 MED ORDER — TAMSULOSIN HCL 0.4 MG PO CAPS
0.8000 mg | ORAL_CAPSULE | Freq: Every day | ORAL | Status: DC
  Administered 2024-05-07 – 2024-05-14 (×8): 0.8 mg via ORAL
  Filled 2024-05-07 (×8): qty 2

## 2024-05-07 MED ORDER — POLYETHYLENE GLYCOL 3350 17 G PO PACK
17.0000 g | PACK | Freq: Every day | ORAL | Status: DC | PRN
  Administered 2024-05-08: 17 g via ORAL
  Filled 2024-05-07: qty 1

## 2024-05-07 MED ORDER — VANCOMYCIN HCL IN DEXTROSE 1-5 GM/200ML-% IV SOLN
1000.0000 mg | Freq: Once | INTRAVENOUS | Status: AC
  Administered 2024-05-07: 1000 mg via INTRAVENOUS
  Filled 2024-05-07: qty 200

## 2024-05-07 MED ORDER — CARBIDOPA-LEVODOPA 25-100 MG PO TABS
3.0000 | ORAL_TABLET | Freq: Three times a day (TID) | ORAL | Status: DC
  Administered 2024-05-07 – 2024-05-16 (×27): 3 via ORAL
  Filled 2024-05-07 (×30): qty 3

## 2024-05-07 MED ORDER — CARBIDOPA-LEVODOPA 25-100 MG PO TABS
1.0000 | ORAL_TABLET | Freq: Three times a day (TID) | ORAL | Status: DC
Start: 2024-05-07 — End: 2024-05-07

## 2024-05-07 MED ORDER — ACETAMINOPHEN 10 MG/ML IV SOLN
1000.0000 mg | Freq: Four times a day (QID) | INTRAVENOUS | Status: AC
  Administered 2024-05-07 – 2024-05-08 (×4): 1000 mg via INTRAVENOUS
  Filled 2024-05-07 (×5): qty 100

## 2024-05-07 MED ORDER — SODIUM CHLORIDE 0.9 % IV SOLN
2.0000 g | INTRAVENOUS | Status: DC
  Administered 2024-05-08: 2 g via INTRAVENOUS
  Filled 2024-05-07: qty 20

## 2024-05-07 MED ORDER — INSULIN ASPART 100 UNIT/ML IJ SOLN
0.0000 [IU] | Freq: Three times a day (TID) | INTRAMUSCULAR | Status: DC
  Administered 2024-05-07: 3 [IU] via SUBCUTANEOUS
  Administered 2024-05-08: 2 [IU] via SUBCUTANEOUS
  Administered 2024-05-08: 7 [IU] via SUBCUTANEOUS
  Administered 2024-05-08 – 2024-05-09 (×2): 3 [IU] via SUBCUTANEOUS
  Administered 2024-05-09: 2 [IU] via SUBCUTANEOUS
  Administered 2024-05-09: 3 [IU] via SUBCUTANEOUS
  Administered 2024-05-10 (×2): 2 [IU] via SUBCUTANEOUS
  Administered 2024-05-10: 5 [IU] via SUBCUTANEOUS
  Administered 2024-05-11: 1 [IU] via SUBCUTANEOUS
  Administered 2024-05-11 (×2): 2 [IU] via SUBCUTANEOUS
  Filled 2024-05-07 (×7): qty 1
  Filled 2024-05-07: qty 3
  Filled 2024-05-07 (×5): qty 1

## 2024-05-07 MED ORDER — ONDANSETRON HCL 4 MG/2ML IJ SOLN: 4.0000 mg | Freq: Four times a day (QID) | INTRAMUSCULAR | Status: DC | PRN

## 2024-05-07 MED ORDER — LISINOPRIL 10 MG PO TABS
10.0000 mg | ORAL_TABLET | Freq: Two times a day (BID) | ORAL | Status: DC
Start: 2024-05-07 — End: 2024-05-09
  Administered 2024-05-07 – 2024-05-09 (×4): 10 mg via ORAL
  Filled 2024-05-07 (×4): qty 1

## 2024-05-07 MED ORDER — INSULIN GLARGINE 100 UNIT/ML ~~LOC~~ SOLN
10.0000 [IU] | Freq: Every day | SUBCUTANEOUS | Status: DC
Start: 2024-05-07 — End: 2024-05-09
  Administered 2024-05-07 – 2024-05-08 (×2): 10 [IU] via SUBCUTANEOUS
  Filled 2024-05-07 (×2): qty 0.1

## 2024-05-07 MED ORDER — ONDANSETRON HCL 4 MG PO TABS: 4.0000 mg | ORAL_TABLET | Freq: Four times a day (QID) | ORAL | Status: DC | PRN

## 2024-05-07 MED ORDER — ENOXAPARIN SODIUM 60 MG/0.6ML IJ SOSY
0.5000 mg/kg | PREFILLED_SYRINGE | INTRAMUSCULAR | Status: DC
  Administered 2024-05-07 – 2024-05-15 (×9): 55 mg via SUBCUTANEOUS
  Filled 2024-05-07 (×10): qty 0.6

## 2024-05-07 MED ORDER — LACTATED RINGERS IV BOLUS (SEPSIS)
1000.0000 mL | Freq: Once | INTRAVENOUS | Status: AC
  Administered 2024-05-07: 1000 mL via INTRAVENOUS

## 2024-05-07 MED ORDER — SODIUM CHLORIDE 0.9 % IV SOLN
500.0000 mg | INTRAVENOUS | Status: DC
  Administered 2024-05-08 – 2024-05-09 (×2): 500 mg via INTRAVENOUS
  Filled 2024-05-07 (×3): qty 5

## 2024-05-07 MED ORDER — LEVETIRACETAM 500 MG PO TABS
1000.0000 mg | ORAL_TABLET | Freq: Two times a day (BID) | ORAL | Status: DC
  Administered 2024-05-07 – 2024-05-16 (×18): 1000 mg via ORAL
  Filled 2024-05-07 (×19): qty 2

## 2024-05-07 MED ORDER — INSULIN ASPART 100 UNIT/ML IJ SOLN
0.0000 [IU] | Freq: Every day | INTRAMUSCULAR | Status: DC
  Administered 2024-05-07 – 2024-05-08 (×2): 2 [IU] via SUBCUTANEOUS
  Administered 2024-05-09: 4 [IU] via SUBCUTANEOUS
  Administered 2024-05-11: 2 [IU] via SUBCUTANEOUS
  Filled 2024-05-07 (×5): qty 1

## 2024-05-07 NOTE — ED Triage Notes (Signed)
 Pt BIB EMS with c/o AMS. Per family, pt is more altered than normal. At baseline pt is nonverbal, can sometimes answer with one word answers. Pt does have a distended ABD.    172/96 HR 122 RR: 38 97% 18G L AC 5mg  Albuterol  0.5mg  Atrovent

## 2024-05-07 NOTE — H&P (Addendum)
 History and Physical    Kirk Bennett. FMW:[id number] DOB: 1951/04/14 DOA: 05/07/2024  DOS: the patient was seen and examined on 05/07/2024  PCP: Rilla Baller, MD   Patient coming from: Home  I have personally briefly reviewed patient's old medical records in [facility]  Chief Complaint: Altered mental status  HPI: Kirk Bennett. is a pleasant 73 y.o. male with medical history significant for Parkinson's disease, homebound status, HTN, HLD, diabetes, history of seizure, BPH OSA who presented to ED with altered mental status.  Patient is minimally verbal and unable to provide any history.  Per EMS documentation patient is more weak and less responsive than at his baseline.  Patient denies any fever, chills, nausea, vomiting.  Patient's granddaughter Charmaine was at bedside.  She stated that Mr. Brase lives with his wife.  His wife was planning on going to [place] trip yesterday and still left.  Since then [facility] the granddaughter was supposed to take care of him for a week.  Patient started looking altered this morning and have some abdominal distention.  She denied any fever, chills, nausea, vomiting.  She told me that only his responsiveness has been decreased and he was not interested in any activities.  That is why she was concerned and brought him to ED.  ED Course: Upon arrival to the ED, patient is found to be altered, generally weak.  He was found to be positive COVID, chest x-ray showed pneumonia, he was tachycardic at 118 and tachypneic 21.  He was started on Flagyl , vancomycin , cefepime , "Ringer's"  lactate, he was given Tylenol  and IV fluid.  Hospitalist service was consulted for evaluation for sepsis, pneumonia and COVID-19.  Review of Systems:  ROS  All other systems negative except as noted in the HPI.  Past Medical History:  Diagnosis Date   Cataracts, bilateral 02/2011   and suspected glaucoma, to return for f/u, no diabetic retinopathy   CKD stage 3 due to  type 2 diabetes mellitus (HCC) 2015   normal renal US , self referred to Dr Marcelino   Complex partial seizures (HCC) 1990   from surgery for R temporal arachnoid cyst s/p drainage, possible continued sz so changed to lamotrigine (Dr. 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 Damian endo --> 02/2016 decided to return to PCP for DM care    Past Surgical History:  Procedure Laterality Date   CARDIAC CATHETERIZATION  02/19/2009   No blockages (Dr. 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 Mathilda)  reports that he has never smoked. He has never used smokeless tobacco. He reports that he does not drink alcohol and does not use drugs.  Allergies  Allergen Reactions   Bee Venom Shortness Of Breath   Demerol [Meperidine Hcl] Nausea And Vomiting    Family History  Problem Relation Age of Onset   Heart failure Father    Cancer Mother        Lung, smoker   Aortic dissection Mother        Aortic rupture   Mental illness Sister        74 years old   Diabetes Maternal Aunt    Diabetes Cousin    Cancer Cousin        Colon age 8   CAD Neg Hx     Prior to  Admission medications   Medication Sig Start Date End Date Taking? Authorizing Provider  atorvastatin  (LIPITOR) 20 MG tablet TAKE 1 TABLET BY MOUTH EVERY DAY 06/15/23   Rilla Baller, MD  Blood Glucose Monitoring Suppl DEVI 1 each by Does not apply route in the morning, at noon, and at bedtime. May substitute to any manufacturer covered by patient's insurance. 11/16/22   Rilla Baller, MD  carbidopa -levodopa  (SINEMET  IR) 25-100 MG tablet PLEASE SEE ATTACHED FOR DETAILED DIRECTIONS 05/01/21   [provider]  Cholecalciferol (VITAMIN D3) 1000 units CAPS Take 1,000 Units by mouth daily.    [provider]  clotrimazole  (LOTRIMIN  AF) 1 % cream Apply 1 application topically 2 (two) times daily. 01/30/20   Rilla Baller, MD  Continuous Glucose Sensor (FREESTYLE LIBRE 3 SENSOR) MISC PLACE 1 SENSOR ON THE SKIN EVERY 14 DAYS. USE TO CHECK GLUCOSE CONTINUOUSLY 12/13/23   Rilla Baller, MD  cyanocobalamin  (VITAMIN B12) 1000 MCG tablet Take 1 tablet (1,000 mcg total) by mouth once a week. 11/27/23   Rilla Baller, MD  Dulaglutide  (TRULICITY ) 0.75 MG/0.5ML SOAJ INJECT 0.75 MG SUBCUTANEOUSLY ONE TIME PER WEEK 11/22/23   Rilla Baller, MD  empagliflozin (JARDIANCE) 25 MG TABS tablet Take 1 tablet (25 mg total) by mouth daily. 09/21/23   Rilla Baller, MD  glimepiride  (AMARYL ) 4 MG tablet Take 1 tablet (4 mg total) by mouth daily with breakfast. 11/27/23   Rilla Baller, MD  glucose blood (ONE TOUCH ULTRA TEST) test strip Use to check sugar fasting in the AM and 2 hours after lunch or supper. Dx: E11.21 03/28/16   Rilla Baller, MD  latanoprost  (XALATAN ) 0.005 % ophthalmic solution Place 1 drop into both eyes at bedtime. 11/25/22   Rilla Baller, MD  levETIRAcetam  (KEPPRA ) 1000 MG tablet Take 1,000 mg by mouth 2 (two) times daily. 04/29/21   [provider]  lisinopril  (ZESTRIL ) 10 MG tablet Take 1 tablet (10 mg total) by mouth 2 (two) times daily. 11/27/23    Rilla Baller, MD  metFORMIN  (GLUCOPHAGE ) 500 MG tablet Take 1 tablet (500 mg total) by mouth daily with breakfast AND 2 tablets (1,000 mg total) daily with supper. 12/13/23   Rilla Baller, MD  Multiple Vitamin (MULTIVITAMIN ADULT) TABS Take 1 tablet by mouth daily. 01/26/22   Rilla Baller, MD  tamsulosin  (FLOMAX ) 0.4 MG CAPS capsule Take 2 capsules (0.8 mg total) by mouth daily after supper. 11/27/23   Rilla Baller, MD    Physical Exam: Vitals:   05/07/24 1200 05/07/24 1206 05/07/24 1230 05/07/24 1300  BP: (!) 147/80  (!) 154/81 135/78  Pulse: (!) 122  (!) 118 (!) 116  Resp: (!) 32  (!) 21 (!)  25  Temp:      TempSrc:      SpO2: 94%  96% 92%  Weight:  110.3 kg    Height:  6' (1.829 m)      Physical Exam   Constitutional: Alert, awake, calm, comfortable, nonverbal HEENT: Neck supple Respiratory: Clear to auscultation B/L, no wheezing, no rales.  Cardiovascular: Regular rate and rhythm, no murmurs / rubs / gallops. No extremity edema. 2+ pedal pulses. No carotid bruits.  Abdomen: Soft, no tenderness, Bowel sounds positive.  Musculoskeletal: no clubbing / cyanosis. Good ROM, no contractures. Normal muscle tone.  Skin: no rashes, lesions, ulcers. Neurologic: Nonverbal moving all extremities equally and follows very minor commands Psychiatric: Alert and awake  Labs on Admission: I have personally reviewed following labs and imaging studies  CBC: Recent Labs  Lab 05/07/24 1144  WBC 7.6  NEUTROABS 5.0  HGB 13.7  HCT 42.7  MCV 88.2  PLT 134*   Basic Metabolic Panel: Recent Labs  Lab 05/07/24 1144  NA 140  K 4.4  CL 103  CO2 26  GLUCOSE 213*  BUN 20  CREATININE 1.68*  CALCIUM  9.4   GFR: Estimated Creatinine Clearance: 51.7 mL/min (A) (by C-G formula based on SCr of 1.68 mg/dL (H)). Liver Function Tests: Recent Labs  Lab 05/07/24 1144  AST 16  ALT 13  ALKPHOS 85  BILITOT 1.3*  PROT 7.6  ALBUMIN 3.9   No results for input(s): LIPASE,  AMYLASE in the last 168 hours. No results for input(s): AMMONIA in the last 168 hours. Coagulation Profile: No results for input(s): INR, PROTIME in the last 168 hours. Cardiac Enzymes: No results for input(s): CKTOTAL, CKMB, CKMBINDEX, TROPONINI, TROPONINIHS in the last 168 hours. BNP (last 3 results) No results for input(s): BNP in the last 8760 hours. HbA1C: No results for input(s): HGBA1C in the last 72 hours. CBG: No results for input(s): GLUCAP in the last 168 hours. Lipid Profile: No results for input(s): CHOL, HDL, LDLCALC, TRIG, CHOLHDL, LDLDIRECT in the last 72 hours. Thyroid  Function Tests: No results for input(s): TSH, T4TOTAL, FREET4, T3FREE, THYROIDAB in the last 72 hours. Anemia Panel: No results for input(s): VITAMINB12, FOLATE, FERRITIN, TIBC, IRON, RETICCTPCT in the last 72 hours. Urine analysis:    Component Value Date/Time   COLORURINE STRAW (A) 05/07/2024 1143   APPEARANCEUR CLEAR (A) 05/07/2024 1143   LABSPEC 1.015 05/07/2024 1143   PHURINE 5.0 05/07/2024 1143   GLUCOSEU >=500 (A) 05/07/2024 1143   HGBUR SMALL (A) 05/07/2024 1143   BILIRUBINUR NEGATIVE 05/07/2024 1143   BILIRUBINUR negative 10/06/2020 1144   KETONESUR 5 (A) 05/07/2024 1143   PROTEINUR NEGATIVE 05/07/2024 1143   UROBILINOGEN 0.2 10/06/2020 1144   NITRITE NEGATIVE 05/07/2024 1143   LEUKOCYTESUR NEGATIVE 05/07/2024 1143    Radiological Exams on Admission: I have personally reviewed images DG Chest Portable 1 View Result Date: 05/07/2024 CLINICAL DATA:  infection EXAM: PORTABLE CHEST - 1 VIEW COMPARISON:  10/13/2019 FINDINGS: Low lung volumes. Bilateral perihilar interstitial opacities. Patchy opacities in the right lung base. No pleural effusion or pneumothorax. No cardiomegaly. Tortuous aorta with aortic atherosclerosis. No acute fracture or destructive lesions. Multilevel thoracic osteophytosis. IMPRESSION: 1. Bilateral perihilar  interstitial opacities,, which may represent bronchovascular crowding due to low lung volumes, interstitial edema, or atypical/viral infection, in the correct clinical context. 2. Patchy opacities in the right lung base, possibly atelectasis, asymmetric edema, or pneumonia. Electronically Signed   By: Rogelia Myers M.D.   On: 05/07/2024 12:22  EKG: My personal interpretation of EKG shows: Sinus tachycardia at 124 bpm.  Multiple premature ventricular complexes present.    Assessment/Plan Principal Problem:   Pneumonia due to COVID-19 virus Active Problems:   Type 2 diabetes mellitus with diabetic nephropathy (HCC)   Essential hypertension, benign   OSA (obstructive sleep apnea)   BPH (benign prostatic hyperplasia)   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HCC)   Pneumonia    Assessment and Plan: 73 year old male with multiple medical problems including but not limited to Parkinson's disease/supranuclear palsy, HTN, HLD, DM, BPH, OSA on CPAP who was brought in from home for decreased responsiveness.  1.  COVID-19 pneumonia - He is maintaining oxygen without supplementation. - He is very weak and almost becoming normal verbal that is new to him - He will be admitted to hospital as inpatient. - He was given antibiotic vancomycin  cefepime  and Flagyl  in the emergency room - I will place him on antibiotic for community  acquired pneumonia due to COVID. - He already received vancomycin  and cefepime  1 dose. - If he does not improve then we can broaden the antibiotic coverage. - Follow the cultures. - Continue supportive care - Due to history of diabetes no need for steroid at this point as he is not hypoxemic  2.  Parkinson's disease - Continue his home medications carbidopa  levodopa  - Continue supportive care  3.  Diabetes type 2 - He uses metformin , Trulicity , glimepiride  and Jardiance at home - Will discontinue all those oral hypoglycemic medications and injections. - He will be placed on  insulin  sliding scale and 10 units of Lantus . - Continue to monitor blood sugars  4.  History of seizure disorder - Patient's granddaughter does not know whether he was taking those medications or not. - Patient has Keppra  listed as his home medication - Continue Keppra  at this point.  5.  HTN/HLD - Resume home medications  6.  BPH - Continue Flomax      DVT prophylaxis: Lovenox  Code Status: Full Code Family Communication: Granddaughter was at bedside and she advised that he is full code Disposition Plan: Home versus rehab Consults called: None Admission status: Inpatient, Telemetry bed   Nena Rebel, MD [facility] 05/07/2024, 2:22 PM

## 2024-05-07 NOTE — ED Provider Notes (Signed)
 [facility] Provider Note    Event Date/Time   First MD Initiated Contact with Patient 05/07/24 1135     (approximate)   History   Altered Mental Status   HPI  Kirk Bennett. is a 73 y.o. male with a history of hypertension, hyperlipidemia, OSA, diabetes, diabetic neuropathy, and parkinsonism who presents with altered mental status.  The patient is minimally verbal and unable to give much history.  Per EMS, the patient is more weak and less responsive than at baseline.  The patient denies acute pain.  I reviewed the past medical records.  The patient's most recent outpatient encounter was on 7/3 with neurology for follow-up of his multifactorial gait impairment.  Has no recent hospitalizations or ED visits.  Physical Exam   Triage Vital Signs: ED Triage Vitals  Encounter Vitals Group     BP      Girls Systolic BP Percentile      Girls Diastolic BP Percentile      Boys Systolic BP Percentile      Boys Diastolic BP Percentile      Pulse      Resp      Temp      Temp src      SpO2      Weight      Height      Head Circumference      Peak Flow      Pain Score      Pain Loc      Pain Education      Exclude from Growth Chart     Most recent vital signs: Vitals:   05/07/24 1230 05/07/24 1300  BP: (!) 154/81 135/78  Pulse: (!) 118 (!) 116  Resp: (!) 21 (!) 25  Temp:    SpO2: 96% 92%     General: Awake, no distress.  CV:  Good peripheral perfusion.  Resp:  Increased respiratory effort.  Lungs CTAB. Abd:  Soft and nontender.  No distention.  Other:  EOMI.  PERRLA.  Dry mucous membranes.  No facial droop.  Motor intact in all extremities.   ED Results / Procedures / Treatments   Labs (all labs ordered are listed, but only abnormal results are displayed) Labs Reviewed  RESP PANEL BY RT-PCR (RSV, FLU A&B, COVID)  [id number] - Abnormal; Notable for the following components:      Result Value   SARS Coronavirus 2 by RT PCR POSITIVE (*)     All other components within normal limits  COMPREHENSIVE METABOLIC PANEL WITH GFR - Abnormal; Notable for the following components:   Glucose, Bld 213 (*)    Creatinine, Ser 1.68 (*)    Total Bilirubin 1.3 (*)    GFR, Estimated 43 (*)    All other components within normal limits  CBC WITH DIFFERENTIAL/PLATELET - Abnormal; Notable for the following components:   Platelets 134 (*)    All other components within normal limits  URINALYSIS, W/ REFLEX TO CULTURE (INFECTION SUSPECTED) - Abnormal; Notable for the following components:   Color, Urine STRAW (*)    APPearance CLEAR (*)    Glucose, UA >=500 (*)    Hgb urine dipstick SMALL (*)    Ketones, ur 5 (*)    Bacteria, UA RARE (*)    All other components within normal limits  CULTURE, BLOOD (ROUTINE X 2)  CULTURE, BLOOD (ROUTINE X 2)  LACTIC ACID, PLASMA  LACTIC ACID, PLASMA     EKG  ED ECG REPORT I, Waylon Cassis, the attending physician, personally viewed and interpreted this ECG.  Date: 05/07/2024 EKG Time: 1138 Rate: 124 Rhythm: Sinus tachycardia with PVCs QRS Axis: normal Intervals: LAFB ST/T Wave abnormalities: Repolarization abnormality Narrative Interpretation: no evidence of acute ischemia    RADIOLOGY  Chest x-ray: I independently viewed and interpreted the images; there are opacities in the right lower lung concerning for pneumonia  PROCEDURES:  Critical Care performed: No  Procedures   MEDICATIONS ORDERED IN ED: Medications  metroNIDAZOLE  (FLAGYL ) IVPB 500 mg (500 mg Intravenous New Bag/Given 05/07/24 1253)  vancomycin  (VANCOCIN ) IVPB 1000 mg/200 mL premix (has no administration in time range)  acetaminophen  (OFIRMEV ) IV 1,000 mg (0 mg Intravenous Stopped 05/07/24 1250)  lactated ringers  bolus 1,000 mL (1,000 mLs Intravenous New Bag/Given 05/07/24 1208)  ceFEPIme  (MAXIPIME ) 2 g in sodium chloride  0.9 % 100 mL IVPB (0 g Intravenous Stopped 05/07/24 1251)     IMPRESSION / MDM / ASSESSMENT AND  PLAN / ED COURSE  I reviewed the triage vital signs and the nursing notes.  73 year old male with PMH as noted above presents with apparent altered mental status and increased generalized weakness.  On exam the patient is febrile, tachycardic, tachypneic.  He is alert and able to nod to answer questions.  Neurologic exam is nonfocal.  Differential diagnosis includes, but is not limited to, acute infection/sepsis, possible UTI versus respiratory source.  We will give fluids, antipyretic, start empiric antibiotics, obtain lab workup, and reassess.  Patient's presentation is most consistent with acute presentation with potential threat to life or bodily function.  The patient is on the cardiac monitor to evaluate for evidence of arrhythmia and/or significant heart rate changes.  ----------------------------------------- 1:27 PM on 05/07/2024 -----------------------------------------  Chest x-ray shows bilateral opacities worse on the right.  The patient is COVID-positive.  CMP and CBC showed no acute findings.  Lactate is normal.  Given his persistent tachycardia and tachypnea the patient will need admission for further management.  Empiric antibiotics have been given.  I consulted Dr. Roann from the hospitalist service; based on our discussion he agrees to evaluate the patient for admission.   FINAL CLINICAL IMPRESSION(S) / ED DIAGNOSES   Final diagnoses:  COVID  Community acquired pneumonia, unspecified laterality  Sepsis, due to unspecified organism, unspecified whether acute organ dysfunction present [facility])     Rx / DC Orders   ED Discharge Orders     None        Note:  This document was prepared using Dragon voice recognition software and may include unintentional dictation errors.    Cassis Waylon, MD 05/07/24 1328

## 2024-05-08 ENCOUNTER — Inpatient Hospital Stay

## 2024-05-08 DIAGNOSIS — U071 COVID-19: Secondary | ICD-10-CM | POA: Diagnosis not present

## 2024-05-08 DIAGNOSIS — J1282 Pneumonia due to coronavirus disease 2019: Secondary | ICD-10-CM | POA: Diagnosis not present

## 2024-05-08 LAB — PROTIME-INR
INR: 1.2 (ref 0.8–1.2)
Prothrombin Time: 15.5 s — ABNORMAL HIGH (ref 11.4–15.2)

## 2024-05-08 LAB — COMPREHENSIVE METABOLIC PANEL WITH GFR
ALT: <5 U/L (ref 0–44)
AST: 12 U/L — ABNORMAL LOW (ref 15–41)
Albumin: 3.4 g/dL — ABNORMAL LOW (ref 3.5–5.0)
Alkaline Phosphatase: 68 U/L (ref 38–126)
Anion gap: 10 (ref 5–15)
BUN: 22 mg/dL (ref 8–23)
CO2: 26 mmol/L (ref 22–32)
Calcium: 8.7 mg/dL — ABNORMAL LOW (ref 8.9–10.3)
Chloride: 105 mmol/L (ref 98–111)
Creatinine, Ser: 1.57 mg/dL — ABNORMAL HIGH (ref 0.61–1.24)
GFR, Estimated: 47 mL/min — ABNORMAL LOW (ref >60)
Glucose, Bld: 172 mg/dL — ABNORMAL HIGH (ref 70–99)
Potassium: 3.9 mmol/L (ref 3.5–5.1)
Sodium: 141 mmol/L (ref 135–145)
Total Bilirubin: 1.2 mg/dL (ref 0.0–1.2)
Total Protein: 6.9 g/dL (ref 6.5–8.1)

## 2024-05-08 LAB — GLUCOSE, CAPILLARY
Glucose-Capillary: 192 mg/dL — ABNORMAL HIGH (ref 70–99)
Glucose-Capillary: 343 mg/dL — ABNORMAL HIGH (ref 70–99)
Glucose-Capillary: 210 mg/dL — ABNORMAL HIGH (ref 70–99)
Glucose-Capillary: 247 mg/dL — ABNORMAL HIGH (ref 70–99)

## 2024-05-08 LAB — CBC
HCT: 42.4 % (ref 39.0–52.0)
Hemoglobin: 13.3 g/dL (ref 13.0–17.0)
MCH: 28 pg (ref 26.0–34.0)
MCHC: 31.4 g/dL (ref 30.0–36.0)
MCV: 89.3 fL (ref 80.0–100.0)
Platelets: 148 K/uL — ABNORMAL LOW (ref 150–400)
RBC: 4.75 MIL/uL (ref 4.22–5.81)
RDW: 11.7 % (ref 11.5–15.5)
WBC: 5.5 K/uL (ref 4.0–10.5)
nRBC: 0 % (ref 0.0–0.2)

## 2024-05-08 LAB — D-DIMER, QUANTITATIVE: D-Dimer, Quant: 0.36 ug{FEU}/mL (ref 0.00–0.50)

## 2024-05-08 LAB — MRSA NEXT GEN BY PCR, NASAL: MRSA by PCR Next Gen: NOT DETECTED

## 2024-05-08 LAB — STREP PNEUMONIAE URINARY ANTIGEN: Strep Pneumo Urinary Antigen: NEGATIVE

## 2024-05-08 LAB — BRAIN NATRIURETIC PEPTIDE: B Natriuretic Peptide: 34.9 pg/mL (ref 0.0–100.0)

## 2024-05-08 MED ORDER — LACTATED RINGERS IV BOLUS: 500.0000 mL | Freq: Once | INTRAVENOUS | Status: DC

## 2024-05-08 MED ORDER — SODIUM CHLORIDE 0.9 % IV SOLN
200.0000 mg | Freq: Once | INTRAVENOUS | Status: DC
  Filled 2024-05-08: qty 40

## 2024-05-08 MED ORDER — VANCOMYCIN HCL 1500 MG/300ML IV SOLN
1500.0000 mg | INTRAVENOUS | Status: DC
Start: 2024-05-09 — End: 2024-05-09
  Filled 2024-05-08: qty 300

## 2024-05-08 MED ORDER — ACETAMINOPHEN 325 MG PO TABS
650.0000 mg | ORAL_TABLET | Freq: Once | ORAL | Status: AC
  Administered 2024-05-08: 650 mg via ORAL
  Filled 2024-05-08: qty 2

## 2024-05-08 MED ORDER — VANCOMYCIN HCL 1250 MG/250ML IV SOLN
1250.0000 mg | Freq: Once | INTRAVENOUS | Status: AC
  Administered 2024-05-08: 1250 mg via INTRAVENOUS
  Filled 2024-05-08: qty 250

## 2024-05-08 MED ORDER — FUROSEMIDE 10 MG/ML IJ SOLN
20.0000 mg | Freq: Once | INTRAMUSCULAR | Status: AC
  Administered 2024-05-08: 20 mg via INTRAVENOUS
  Filled 2024-05-08: qty 2

## 2024-05-08 MED ORDER — VANCOMYCIN HCL IN DEXTROSE 1-5 GM/200ML-% IV SOLN
1000.0000 mg | Freq: Once | INTRAVENOUS | Status: AC
  Administered 2024-05-08: 1000 mg via INTRAVENOUS
  Filled 2024-05-08: qty 200

## 2024-05-08 MED ORDER — LACTATED RINGERS IV BOLUS
500.0000 mL | Freq: Once | INTRAVENOUS | Status: AC
  Administered 2024-05-08: 500 mL via INTRAVENOUS

## 2024-05-08 MED ORDER — REMDESIVIR 100 MG IV SOLR: 100.0000 mg | Freq: Every day | INTRAVENOUS | Status: DC

## 2024-05-08 MED ORDER — ACETAMINOPHEN 325 MG PO TABS
650.0000 mg | ORAL_TABLET | Freq: Four times a day (QID) | ORAL | Status: DC | PRN
  Administered 2024-05-10 – 2024-05-11 (×2): 650 mg via ORAL
  Filled 2024-05-08 (×3): qty 2

## 2024-05-08 MED ORDER — ACETAMINOPHEN 325 MG PO TABS
ORAL_TABLET | ORAL | Status: AC
  Administered 2024-05-08: 650 mg via ORAL
  Filled 2024-05-08: qty 2

## 2024-05-08 MED ORDER — PIPERACILLIN-TAZOBACTAM 3.375 G IVPB
3.3750 g | Freq: Three times a day (TID) | INTRAVENOUS | Status: AC
  Administered 2024-05-09 – 2024-05-11 (×9): 3.375 g via INTRAVENOUS
  Filled 2024-05-08 (×9): qty 50

## 2024-05-08 MED ORDER — PIPERACILLIN-TAZOBACTAM 3.375 G IVPB
3.3750 g | Freq: Three times a day (TID) | INTRAVENOUS | Status: DC
  Administered 2024-05-08: 3.375 g via INTRAVENOUS
  Filled 2024-05-08: qty 50

## 2024-05-08 MED ORDER — LACTATED RINGERS IV SOLN: INTRAVENOUS | Status: DC

## 2024-05-08 NOTE — Progress Notes (Signed)
 CCMD called, noted on telemetry, runs Ventricular Trigeminy, Non sustained VT 3 beats, asymptomatic upon assessment. No chest pain noted. informed Dr Delayne Solian, no new order.

## 2024-05-08 NOTE — Plan of Care (Signed)

## 2024-05-08 NOTE — Progress Notes (Signed)
 Pharmacy Antibiotic Note  Kirk Bennett. is a 73 y.o. male admitted on 05/07/2024 with pneumonia. Patient presented to ED with altered mental status. Per EMS, patient was more weak and less responsive than his baseline. Patient denies fever, chills, and N/V. In the ED, patient found to be COVID+. Chest x-ray also showed PNA.  Pharmacy has been consulted for vancomycin  dosing.  S/p vancomycin  1000 mg IV x 1 in ED on 9/16 Patient also received metronidazole , cefepime , and ceftriaxone  x 1 on 9/16  Plan: Give vancomycin  2250 mg IV x1 since patient was never properly loaded followed by 1500 mg IV Q24H. Goal AUC 400-550. Expected AUC: 432.1 Expected Css min: 10.9 SCr used: 1.57  Weight used: IBW, Vd used: 0.72 (BMI 32.98) Patient is also on Azithromycin  500 mg IV Q24H Patient is also on Zosyn  3.375 g IV Q8H MRSA PCR ordered, follow-up for possible de-escalation of vancomycin  Continue to monitor renal function and follow culture results  Height: 6' (182.9 cm) Weight: 110.3 kg (243 lb 2.7 oz) IBW/kg (Calculated) : 77.6  Temp (24hrs), Avg:99.8 F (37.7 C), Min:98.3 F (36.8 C), Max:103.3 F (39.6 C)  Recent Labs  Lab 05/07/24 1144 05/08/24 0540  WBC 7.6 5.5  CREATININE 1.68* 1.57*  LATICACIDVEN 1.4  --     Estimated Creatinine Clearance: 55.4 mL/min (A) (by C-G formula based on SCr of 1.57 mg/dL (H)).    Allergies  Allergen Reactions   Bee Venom Shortness Of Breath   Demerol [Meperidine Hcl] Nausea And Vomiting    Antimicrobials this admission: 9/16 Vanc, metronidazole , ceftriaxone , and cefepime  x 1 9/17 Vanc>> 9/17 Zosyn  >>  9/17 Azithromycin  >>   Microbiology results: 9/16 BCx: NG <24h 9/17 MRSA PCR: ordered  Thank you for allowing pharmacy to be a part of this patient's care.  Lum VEAR Mania, PharmD, BCPS 05/08/2024 4:05 PM

## 2024-05-08 NOTE — Progress Notes (Signed)
 PROGRESS NOTE    Kirk Bennett.  FMW:[id number] DOB: 1951/01/20 DOA: 05/07/2024 PCP: Rilla Baller, MD  Chief Complaint  Patient presents with   Altered Mental Status    Hospital Course:  Kirk Bennett. is a pleasant 73 y.o. male with medical history significant for Parkinson's disease, homebound status, HTN, HLD, diabetes, history of seizure, BPH OSA who presented to ED with altered mental status.  Patient is minimally verbal and unable to provide any history.  Brought in due to decreased responsiveness than baseline  Patient was admitted for COVID infection and pneumonia, hospital course as below  Subjective: Was examined at the bedside, new to me today.  Minimally responsive with few words here and there Continues to be tachycardic, tachypneic and spiking fevers, changed to vancomycin  and Zosyn  for broad-spectrum coverage.  Called son Selinda and provided updates and answered all questions   Objective: Vitals:   05/07/24 2235 05/07/24 2357 05/08/24 0524 05/08/24 0855  BP: (!) 150/75 (!) 157/74 (!) 155/84 (!) 154/80  Pulse: 89 94 90 (!) 101  Resp: 20 20 20    Temp: 98.7 F (37.1 C) 99.8 F (37.7 C) 98.6 F (37 C) 98.3 F (36.8 C)  TempSrc:      SpO2: 97% 97% 96% 93%  Weight:      Height:        Intake/Output Summary (Last 24 hours) at 05/08/2024 0928 Last data filed at 05/08/2024 0858 Gross per 24 hour  Intake 790 ml  Output 900 ml  Net -110 ml   Filed Weights   05/07/24 1206  Weight: 110.3 kg    Examination: Constitutional: Alert, awake, calm, comfortable, nonverbal HEENT: Neck supple Respiratory: Tachypneic, Clear to auscultation B/L, no wheezing, no rales.  Cardiovascular: Regular rate and rhythm, no murmurs / rubs / gallops. No extremity edema. 2+ pedal pulses. No carotid bruits.  Abdomen: Soft, no tenderness, Bowel sounds positive.  Musculoskeletal: no clubbing / cyanosis. Good ROM, no contractures. Normal muscle tone.  Skin: no rashes, lesions,  ulcers. Neurologic: Nonverbal moving all extremities equally and follows very minor commands Psychiatric: Alert and awake  Assessment & Plan:  Principal Problem:   Pneumonia due to COVID-19 virus Active Problems:   Type 2 diabetes mellitus with diabetic nephropathy (HCC)   Essential hypertension, benign   OSA (obstructive sleep apnea)   BPH (benign prostatic hyperplasia)   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HCC)   Pneumonia  COVID-19 infection Suspect superimposed bacterial pneumonia - Not requiring supplemental oxygen, spiking fevers, tachypneic and tachycardic. - BNP not elevated, no evidence of fluid overload on chest x-ray - Does not meet the criteria for remdesivir  or dexamethasone (discussed with pharmacy) - Change IV ceftriaxone  to broad coverage with IV vancomycin , Zosyn  and continue azithromycin  - Urine Legionella, Streptococcus pneumonia, respiratory cultures pending - Gentle IV fluids due to poor p.o. intake - Follow-up blood cultures - Check D-dimer, may be elevated due to COVID infection as well.  If elevated will do CT PE, discussed with son about the risk of CIN - Duonebs prn  Parkinson's disease - Continue his home medications carbidopa  levodopa  - Continue supportive care   Diabetes type 2 - He uses metformin , Trulicity , glimepiride  and Jardiance at home - Will discontinue all those oral hypoglycemic medications and injections. - He will be placed on insulin  sliding scale and 10 units of Lantus . - Continue to monitor blood sugars   History of seizure disorder - Continue Keppra   CKD stage 3 - Monitor Cr   HTN/HLD -  Resume home medications   BPH - Continue Flomax   DVT prophylaxis: Lovenox  SQ   Code Status: Full Code Disposition: TBD  Consultants:  None  Procedures:  None  Antimicrobials:  Anti-infectives (From admission, onward)    Start     Dose/Rate Route Frequency Ordered Stop   05/08/24 1000  cefTRIAXone  (ROCEPHIN ) 2 g in sodium chloride  0.9 % 100  mL IVPB        2 g 200 mL/hr over 30 Minutes Intravenous Every 24 hours 05/07/24 1329 05/12/24 0959   05/08/24 1000  azithromycin  (ZITHROMAX ) 500 mg in sodium chloride  0.9 % 250 mL IVPB        500 mg 250 mL/hr over 60 Minutes Intravenous Every 24 hours 05/07/24 1329 05/12/24 0959   05/07/24 1215  ceFEPIme  (MAXIPIME ) 2 g in sodium chloride  0.9 % 100 mL IVPB        2 g 200 mL/hr over 30 Minutes Intravenous  Once 05/07/24 1200 05/07/24 1251   05/07/24 1215  metroNIDAZOLE  (FLAGYL ) IVPB 500 mg        500 mg 100 mL/hr over 60 Minutes Intravenous  Once 05/07/24 1200 05/07/24 1400   05/07/24 1215  vancomycin  (VANCOCIN ) IVPB 1000 mg/200 mL premix        1,000 mg 200 mL/hr over 60 Minutes Intravenous  Once 05/07/24 1200 05/07/24 1504       Data Reviewed: I have personally reviewed following labs and imaging studies CBC: Recent Labs  Lab 05/07/24 1144 05/08/24 0540  WBC 7.6 5.5  NEUTROABS 5.0  --   HGB 13.7 13.3  HCT 42.7 42.4  MCV 88.2 89.3  PLT 134* 148*   Basic Metabolic Panel: Recent Labs  Lab 05/07/24 1144 05/08/24 0540  NA 140 141  K 4.4 3.9  CL 103 105  CO2 26 26  GLUCOSE 213* 172*  BUN 20 22  CREATININE 1.68* 1.57*  CALCIUM  9.4 8.7*   GFR: Estimated Creatinine Clearance: 55.4 mL/min (A) (by C-G formula based on SCr of 1.57 mg/dL (H)). Liver Function Tests: Recent Labs  Lab 05/07/24 1144 05/08/24 0540  AST 16 12*  ALT 13 <5  ALKPHOS 85 68  BILITOT 1.3* 1.2  PROT 7.6 6.9  ALBUMIN 3.9 3.4*   CBG: Recent Labs  Lab 05/07/24 1651 05/07/24 2046 05/08/24 0852  GLUCAP 225* 201* 192*    Recent Results (from the past 240 hours)  Blood culture (routine x 2)     Status: None (Preliminary result)   Collection Time: 05/07/24 11:43 AM   Specimen: BLOOD  Result Value Ref Range Status   Specimen Description BLOOD BLOOD LEFT HAND  Final   Special Requests   Final    BOTTLES DRAWN AEROBIC AND ANAEROBIC Blood Culture results may not be optimal due to an inadequate  volume of blood received in culture bottles   Culture   Final    NO GROWTH < 24 HOURS Performed at [facility], [address] [place], [place] [address]    Report Status PENDING  Incomplete  Blood culture (routine x 2)     Status: None (Preliminary result)   Collection Time: 05/07/24 11:43 AM   Specimen: BLOOD  Result Value Ref Range Status   Specimen Description BLOOD BLOOD RIGHT HAND  Final   Special Requests   Final    BOTTLES DRAWN AEROBIC AND ANAEROBIC Blood Culture results may not be optimal due to an inadequate volume of blood received in culture bottles   Culture   Final  NO GROWTH < 24 HOURS Performed at [facility], [address] [place], [place] [address]    Report Status PENDING  Incomplete  Resp panel by RT-PCR (RSV, Flu A&B, Covid) Anterior Nasal Swab     Status: Abnormal   Collection Time: 05/07/24 11:44 AM   Specimen: Anterior Nasal Swab  Result Value Ref Range Status   SARS Coronavirus 2 by RT PCR POSITIVE (A) NEGATIVE Final    Comment: (NOTE) SARS-CoV-2 target nucleic acids are DETECTED.  The SARS-CoV-2 RNA is generally detectable in upper respiratory specimens during the acute phase of infection. Positive results are indicative of the presence of the identified virus, but do not rule out bacterial infection or co-infection with other pathogens not detected by the test. Clinical correlation with patient history and other diagnostic information is necessary to determine patient infection status. The expected result is Negative.  Fact Sheet for Patients: BloggerCourse.com  Fact Sheet for Healthcare Providers: SeriousBroker.it  This test is not yet approved or cleared by the [place]  FDA and  has been authorized for detection and/or diagnosis of SARS-CoV-2 by FDA under an Emergency Use Authorization (EUA).  This EUA will remain in effect (meaning this test can be used) for the  duration of  the COVID-19 declaration under Section 564(b)(1) of the A ct, 21 U.S.C. section 360bbb-3(b)(1), unless the authorization is terminated or revoked sooner.     Influenza A by PCR NEGATIVE NEGATIVE Final   Influenza B by PCR NEGATIVE NEGATIVE Final    Comment: (NOTE) The Xpert Xpress SARS-CoV-2/FLU/RSV plus assay is intended as an aid in the diagnosis of influenza from Nasopharyngeal swab specimens and should not be used as a sole basis for treatment. Nasal washings and aspirates are unacceptable for Xpert Xpress SARS-CoV-2/FLU/RSV testing.  Fact Sheet for Patients: BloggerCourse.com  Fact Sheet for Healthcare Providers: SeriousBroker.it  This test is not yet approved or cleared by the [place]  FDA and has been authorized for detection and/or diagnosis of SARS-CoV-2 by FDA under an Emergency Use Authorization (EUA). This EUA will remain in effect (meaning this test can be used) for the duration of the COVID-19 declaration under Section 564(b)(1) of the Act, 21 U.S.C. section 360bbb-3(b)(1), unless the authorization is terminated or revoked.     Resp Syncytial Virus by PCR NEGATIVE NEGATIVE Final    Comment: (NOTE) Fact Sheet for Patients: BloggerCourse.com  Fact Sheet for Healthcare Providers: SeriousBroker.it  This test is not yet approved or cleared by the [place]  FDA and has been authorized for detection and/or diagnosis of SARS-CoV-2 by FDA under an Emergency Use Authorization (EUA). This EUA will remain in effect (meaning this test can be used) for the duration of the COVID-19 declaration under Section 564(b)(1) of the Act, 21 U.S.C. section 360bbb-3(b)(1), unless the authorization is terminated or revoked.  Performed at [facility], [address] [place], [place] [address]      Radiology Studies: DG Chest Portable 1 View Result  Date: 05/07/2024 CLINICAL DATA:  infection EXAM: PORTABLE CHEST - 1 VIEW COMPARISON:  10/13/2019 FINDINGS: Low lung volumes. Bilateral perihilar interstitial opacities. Patchy opacities in the right lung base. No pleural effusion or pneumothorax. No cardiomegaly. Tortuous aorta with aortic atherosclerosis. No acute fracture or destructive lesions. Multilevel thoracic osteophytosis. IMPRESSION: 1. Bilateral perihilar interstitial opacities,, which may represent bronchovascular crowding due to low lung volumes, interstitial edema, or atypical/viral infection, in the correct clinical context. 2. Patchy opacities in the right lung base, possibly atelectasis, asymmetric edema, or  pneumonia. Electronically Signed   By: Rogelia Myers M.D.   On: 05/07/2024 12:22    Scheduled Meds:  atorvastatin   20 mg Oral Daily   carbidopa -levodopa   3 tablet Oral TID   enoxaparin  (LOVENOX ) injection  0.5 mg/kg Subcutaneous Q24H   insulin  aspart  0-5 Units Subcutaneous QHS   insulin  aspart  0-9 Units Subcutaneous TID WC   insulin  glargine  10 Units Subcutaneous QHS   levETIRAcetam   1,000 mg Oral BID   lisinopril   10 mg Oral BID   tamsulosin   0.8 mg Oral QPC supper   Continuous Infusions:  azithromycin      cefTRIAXone  (ROCEPHIN )  IV       LOS: 1 day  MDM: Patient is high risk for one or more organ failure.  They necessitate ongoing hospitalization for continued IV therapies and subsequent lab monitoring. Total time spent interpreting labs and vitals, reviewing the medical record, coordinating care amongst consultants and care team members, directly assessing and discussing care with the patient and/or family: 55 min  Laree Lock, MD [facility]  To contact the attending physician between 7A-7P please use Epic Chat. To contact the covering physician during after hours 7P-7A, please review Amion.  05/08/2024, 9:28 AM   *This document has been created with the assistance of dictation software. Please  excuse typographical errors. *

## 2024-05-08 NOTE — Progress Notes (Signed)
   05/08/24 1946  Assess: MEWS Score  Temp 97.7 F (36.5 C)  BP (!) 152/86  MAP (mmHg) 104  Pulse Rate (!) 119  Resp (!) 30  Level of Consciousness Alert  SpO2 94 %  O2 Device Room Air  Assess: MEWS Score  MEWS Temp 0  MEWS Systolic 0  MEWS Pulse 2  MEWS RR 2  MEWS LOC 0  MEWS Score 4  MEWS Score Color Red  Assess: if the MEWS score is Yellow or Red  Were vital signs accurate and taken at a resting state? Yes  Does the patient meet 2 or more of the SIRS criteria? Yes  Does the patient have a confirmed or suspected source of infection? Yes  MEWS guidelines implemented  Yes, red  Treat  MEWS Interventions Considered administering scheduled or prn medications/treatments as ordered  Take Vital Signs  Increase Vital Sign Frequency  Red: Q1hr x2, continue Q4hrs until patient remains green for 12hrs  Escalate  MEWS: Escalate Red: Discuss with charge nurse and notify provider. Consider notifying RRT. If remains red for 2 hours consider need for higher level of care  Notify: Charge Nurse/RN  Name of Charge Nurse/RN Notified Eleonor RN  Provider Notification  Provider Name/Title Dr Lawence  Date Provider Notified 05/08/24  Time Provider Notified 2134  Method of Notification Page (secured chat)  Notification Reason Other (Comment) (tachycardia, tachypnea, on and off fever)  Provider response See new orders (Tylenol  650mg  PO and Lasix  20mg  IV)  Date of Provider Response 05/08/24  Time of Provider Response 2136  Assess: SIRS CRITERIA  SIRS Temperature  0  SIRS Respirations  1  SIRS Pulse 1  SIRS WBC 0  SIRS Score Sum  2

## 2024-05-09 ENCOUNTER — Inpatient Hospital Stay

## 2024-05-09 DIAGNOSIS — J1282 Pneumonia due to coronavirus disease 2019: Secondary | ICD-10-CM | POA: Diagnosis not present

## 2024-05-09 DIAGNOSIS — U071 COVID-19: Secondary | ICD-10-CM | POA: Diagnosis not present

## 2024-05-09 LAB — BASIC METABOLIC PANEL WITH GFR
Anion gap: 10 (ref 5–15)
BUN: 20 mg/dL (ref 8–23)
CO2: 26 mmol/L (ref 22–32)
Calcium: 8.4 mg/dL — ABNORMAL LOW (ref 8.9–10.3)
Chloride: 104 mmol/L (ref 98–111)
Creatinine, Ser: 1.93 mg/dL — ABNORMAL HIGH (ref 0.61–1.24)
GFR, Estimated: 37 mL/min — ABNORMAL LOW (ref >60)
Glucose, Bld: 230 mg/dL — ABNORMAL HIGH (ref 70–99)
Potassium: 3.8 mmol/L (ref 3.5–5.1)
Sodium: 140 mmol/L (ref 135–145)

## 2024-05-09 LAB — GLUCOSE, CAPILLARY
Glucose-Capillary: 202 mg/dL — ABNORMAL HIGH (ref 70–99)
Glucose-Capillary: 226 mg/dL — ABNORMAL HIGH (ref 70–99)
Glucose-Capillary: 157 mg/dL — ABNORMAL HIGH (ref 70–99)
Glucose-Capillary: 331 mg/dL — ABNORMAL HIGH (ref 70–99)

## 2024-05-09 LAB — CBC
HCT: 43.7 % (ref 39.0–52.0)
Hemoglobin: 13.7 g/dL (ref 13.0–17.0)
MCH: 28.1 pg (ref 26.0–34.0)
MCHC: 31.4 g/dL (ref 30.0–36.0)
MCV: 89.7 fL (ref 80.0–100.0)
Platelets: 154 K/uL (ref 150–400)
RBC: 4.87 MIL/uL (ref 4.22–5.81)
RDW: 11.5 % (ref 11.5–15.5)
WBC: 6.3 K/uL (ref 4.0–10.5)
nRBC: 0 % (ref 0.0–0.2)

## 2024-05-09 MED ORDER — INSULIN GLARGINE 100 UNIT/ML ~~LOC~~ SOLN
12.0000 [IU] | Freq: Every day | SUBCUTANEOUS | Status: DC
  Administered 2024-05-09: 12 [IU] via SUBCUTANEOUS
  Filled 2024-05-09 (×2): qty 0.12

## 2024-05-09 MED ORDER — ACETAMINOPHEN 325 MG PO TABS
650.0000 mg | ORAL_TABLET | Freq: Once | ORAL | Status: AC
  Administered 2024-05-09: 650 mg via ORAL
  Filled 2024-05-09: qty 2

## 2024-05-09 MED ORDER — LATANOPROST 0.005 % OP SOLN
1.0000 [drp] | Freq: Every day | OPHTHALMIC | Status: DC
  Administered 2024-05-09 – 2024-05-15 (×7): 1 [drp] via OPHTHALMIC
  Filled 2024-05-09: qty 2.5

## 2024-05-09 MED ORDER — INSULIN GLARGINE 100 UNIT/ML ~~LOC~~ SOLN
14.0000 [IU] | Freq: Every day | SUBCUTANEOUS | Status: DC
  Filled 2024-05-09: qty 0.14

## 2024-05-09 NOTE — Plan of Care (Signed)
  Problem: Fluid Volume: Goal: Ability to maintain a balanced intake and output will improve Outcome: Progressing   Problem: Metabolic: Goal: Ability to maintain appropriate glucose levels will improve Outcome: Progressing   Problem: Nutritional: Goal: Maintenance of adequate nutrition will improve Outcome: Progressing Goal: Progress toward achieving an optimal weight will improve Outcome: Progressing   Problem: Clinical Measurements: Goal: Ability to maintain clinical measurements within normal limits will improve Outcome: Progressing Goal: Will remain free from infection Outcome: Progressing Goal: Diagnostic test results will improve Outcome: Progressing Goal: Respiratory complications will improve Outcome: Progressing Goal: Cardiovascular complication will be avoided Outcome: Progressing   Problem: Activity: Goal: Risk for activity intolerance will decrease Outcome: Progressing   Problem: Nutrition: Goal: Adequate nutrition will be maintained Outcome: Progressing   Problem: Coping: Goal: Level of anxiety will decrease Outcome: Progressing   Problem: Elimination: Goal: Will not experience complications related to bowel motility Outcome: Progressing Goal: Will not experience complications related to urinary retention Outcome: Progressing   Problem: Pain Managment: Goal: General experience of comfort will improve and/or be controlled Outcome: Progressing   Problem: Safety: Goal: Ability to remain free from injury will improve Outcome: Progressing   Problem: Clinical Measurements: Goal: Ability to maintain a body temperature in the normal range will improve Outcome: Progressing   Problem: Respiratory: Goal: Ability to maintain adequate ventilation will improve Outcome: Progressing Goal: Ability to maintain a clear airway will improve Outcome: Progressing

## 2024-05-09 NOTE — TOC CM/SW Note (Signed)
 Transition of Care [facility]) - Inpatient Brief Assessment   Patient Details  Name: Kirk Bennett. MRN: [id number] Date of Birth: Oct 18, 1950  Transition of Care [facility]) CM/SW Contact:    [name], LCSW Phone Number: 05/09/2024, 12:48 PM   Clinical Narrative: CSW reviewed chart. No TOC needs identified so far. CSW will continue to follow progress. Please place [facility] consult if any needs arise.  Transition of Care Asessment: Insurance and Status: Insurance coverage has been reviewed Patient has primary care physician: Yes Home environment has been reviewed: Single family home Prior level of function:: Not documented Prior/Current Home Services: No current home services Social Drivers of Health Review: SDOH reviewed no interventions necessary Readmission risk has been reviewed: Yes Transition of care needs: no transition of care needs at this time

## 2024-05-09 NOTE — Inpatient Diabetes Management (Signed)
 Inpatient Diabetes Program Recommendations  AACE/ADA: New Consensus Statement on Inpatient Glycemic Control (2015)  Target Ranges:  Prepandial:   less than 140 mg/dL      Peak postprandial:   less than 180 mg/dL (1-2 hours)      Critically ill patients:  140 - 180 mg/dL    Latest Reference Range & Units 05/08/24 08:52 05/08/24 12:58 05/08/24 16:31 05/08/24 21:48  Glucose-Capillary 70 - 99 mg/dL 807 (H)  2 units Novolog  @1057  343 (H)  7 units Novolog   210 (H)  3 units Novolog   247 (H)  2 units Novolog   10 units Lantus   (H): Data is abnormally high  Latest Reference Range & Units 05/09/24 08:26  Glucose-Capillary 70 - 99 mg/dL 797 (H)  3 units Novolog    (H): Data is abnormally high   Home DM Meds:  Amaryl  4 mg daily  Jardiance 25 mg daily  Metformin  500 AM/ 1000 PM  Trulicity  0.75 mg Qweek  FSL3 CGM  Last A1c was 6.6% (June 2025 at the [facility] office in [place])    Current Orders:  Lantus  14 units at HS  Novolog  0-9 units TID ac/hs    MD- Note Lantus  increased to 14 units for bedtime tonight  Please also consider adding low dose Novolog  Meal Coverage: Novolog  3 units TID with meals HOLD if pt NPO HOLD if pt eats <50% meals     --Will follow patient during hospitalization--  Adina Rudolpho Arrow RN, MSN, CDCES Diabetes Coordinator Inpatient Glycemic Control Team Team Pager: [phone] (8a-5p)

## 2024-05-09 NOTE — Progress Notes (Signed)
   05/09/24 2006  Vitals  Temp 99.7 F (37.6 C)  Temp Source Oral  BP (!) 146/76  MAP (mmHg) 97  BP Location Right Arm  BP Method Automatic  Patient Position (if appropriate) Lying  Pulse Rate (!) 103  Pulse Rate Source Monitor  Resp (!) 25  MEWS COLOR  MEWS Score Color Yellow  MEWS Score  MEWS Temp 0  MEWS Systolic 0  MEWS Pulse 1  MEWS RR 1  MEWS LOC 0  MEWS Score 2  Provider Notification  Provider Name/Title mancy  Date Provider Notified 05/09/24  Time Provider Notified 2017  Method of Notification Page  Notification Reason  (yellow mews)  Provider response No new orders  Date of Provider Response 05/09/24  Time of Provider Response 2018

## 2024-05-09 NOTE — Progress Notes (Addendum)
 PROGRESS NOTE    Kirk Bennett.  FMW:[id number] DOB: 11-Dec-1950 DOA: 05/07/2024 PCP: Rilla Baller, MD  Chief Complaint  Patient presents with   Altered Mental Status    Hospital Course:  Kirk Bennett. is a pleasant 73 y.o. male with medical history significant for Parkinson's disease, homebound status, HTN, HLD, diabetes, history of seizure, BPH OSA who presented to ED with altered mental status.  Patient is minimally verbal and unable to provide any history.  Brought in due to decreased responsiveness than baseline  Patient was admitted for COVID infection and pneumonia, hospital course as below  Subjective: Was examined at the bedside, very responsive, answering simple questions in one-word.  Granddaughter Charmaine present at the bedside, provided updates and answered all questions Has distended abdomen, denies pain.  Will get x-ray abdomen Tachycardia and tachypnea improving, will continue IV antibiotics  Objective: Vitals:   05/09/24 0200 05/09/24 0400 05/09/24 1210 05/09/24 1629  BP: 112/72 (!) 146/78  136/88  Pulse: (!) 104 99    Resp: (!) 23 (!) 22    Temp: 99.2 F (37.3 C) 98.6 F (37 C) 98.1 F (36.7 C) 98.1 F (36.7 C)  TempSrc: Oral Oral Oral Oral  SpO2: 93% 95%    Weight:      Height:        Intake/Output Summary (Last 24 hours) at 05/09/2024 1824 Last data filed at 05/09/2024 1630 Gross per 24 hour  Intake 835.5 ml  Output 2850 ml  Net -2014.5 ml   Filed Weights   05/07/24 1206  Weight: 110.3 kg    Examination: Constitutional: Alert, awake, calm, comfortable, nonverbal HEENT: Neck supple Respiratory: Tachypneic improving, Clear to auscultation B/L, no wheezing, no rales.  Cardiovascular: Regular rate and rhythm, no murmurs / rubs / gallops. No extremity edema. 2+ pedal pulses. No carotid bruits.  Abdomen: Soft, no tenderness, Bowel sounds positive.  Musculoskeletal: no clubbing / cyanosis. Good ROM, no contractures. Normal muscle tone.   Skin: no rashes, lesions, ulcers. Neurologic: Nonverbal moving all extremities equally and follows very minor commands Psychiatric: Alert and awake  Assessment & Plan:  Principal Problem:   Pneumonia due to COVID-19 virus Active Problems:   Type 2 diabetes mellitus with diabetic nephropathy (HCC)   Essential hypertension, benign   OSA (obstructive sleep apnea)   BPH (benign prostatic hyperplasia)   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HCC)   Pneumonia  COVID-19 infection Suspect superimposed bacterial pneumonia - Not requiring supplemental oxygen, spiking fevers, tachypneic and tachycardic. - BNP not elevated, no evidence of fluid overload on chest x-ray - Does not meet the criteria for remdesivir  or dexamethasone (discussed with pharmacy) - MRSA neg, d-dimer not elevated - Continue IV Zosyn , azithromycin .  Discontinue vancomycin  - Urine Legionella, Streptococcus pneumonia, respiratory cultures pending - Follow-up blood cultures - Seen by SLP, regular thin liquids - Duonebs prn  AKI on CKD stage 3 - Cr creased to 1.93, and appears to be around 1.5-1.6 - s/p gentle IV fluids, encourage p.o. intake. Also received lasix  one dose overnight - BS > , straight cath done - BS PVR every shift and straight cath if > 350 ml - Hold Lisinopril  - Monitor Cr  Parkinson's disease - Continue his home medications carbidopa  levodopa  - Continue supportive care   Diabetes type 2 - He uses metformin , Trulicity , glimepiride  and Jardiance at home - Will discontinue all those oral hypoglycemic medications and injections. - Inc Lantus  12u, SSI - Continue to monitor blood sugars   History of seizure  disorder - Continue Keppra    HTN/HLD - Lisinopril  held due to AKI on CKD   BPH - Continue Flomax   -- PT/OT eval  DVT prophylaxis: Lovenox  SQ   Code Status: Full Code Disposition: TBD  Consultants:  None  Procedures:  None  Antimicrobials:  Anti-infectives (From admission, onward)     Start     Dose/Rate Route Frequency Ordered Stop   05/09/24 1700  vancomycin  (VANCOREADY) IVPB 1500 mg/300 mL  Status:  Discontinued        1,500 mg 150 mL/hr over 120 Minutes Intravenous Every 24 hours 05/08/24 1617 05/09/24 1408   05/09/24 1000  remdesivir  100 mg in sodium chloride  0.9 % 100 mL IVPB  Status:  Discontinued       Placed in Followed by Linked Group   100 mg 200 mL/hr over 30 Minutes Intravenous Daily 05/08/24 1316 05/08/24 1355   05/09/24 0200  piperacillin -tazobactam (ZOSYN ) IVPB 3.375 g        3.375 g 12.5 mL/hr over 240 Minutes Intravenous Every 8 hours 05/08/24 2102     05/08/24 1730  vancomycin  (VANCOCIN ) IVPB 1000 mg/200 mL premix       Placed in And Linked Group   1,000 mg 200 mL/hr over 60 Minutes Intravenous  Once 05/08/24 1617 05/08/24 1835   05/08/24 1730  vancomycin  (VANCOREADY) IVPB 1250 mg/250 mL       Placed in And Linked Group   1,250 mg 166.7 mL/hr over 90 Minutes Intravenous  Once 05/08/24 1617 05/08/24 2023   05/08/24 1700  piperacillin -tazobactam (ZOSYN ) IVPB 3.375 g  Status:  Discontinued        3.375 g 12.5 mL/hr over 240 Minutes Intravenous Every 8 hours 05/08/24 1600 05/08/24 2103   05/08/24 1600  remdesivir  200 mg in sodium chloride  0.9% 250 mL IVPB  Status:  Discontinued       Placed in Followed by Linked Group   200 mg 580 mL/hr over 30 Minutes Intravenous Once 05/08/24 1316 05/08/24 1355   05/08/24 1000  cefTRIAXone  (ROCEPHIN ) 2 g in sodium chloride  0.9 % 100 mL IVPB  Status:  Discontinued        2 g 200 mL/hr over 30 Minutes Intravenous Every 24 hours 05/07/24 1329 05/08/24 1600   05/08/24 1000  azithromycin  (ZITHROMAX ) 500 mg in sodium chloride  0.9 % 250 mL IVPB        500 mg 250 mL/hr over 60 Minutes Intravenous Every 24 hours 05/07/24 1329 05/12/24 0959   05/07/24 1215  ceFEPIme  (MAXIPIME ) 2 g in sodium chloride  0.9 % 100 mL IVPB        2 g 200 mL/hr over 30 Minutes Intravenous  Once 05/07/24 1200 05/07/24 1251   05/07/24 1215   metroNIDAZOLE  (FLAGYL ) IVPB 500 mg        500 mg 100 mL/hr over 60 Minutes Intravenous  Once 05/07/24 1200 05/07/24 1400   05/07/24 1215  vancomycin  (VANCOCIN ) IVPB 1000 mg/200 mL premix        1,000 mg 200 mL/hr over 60 Minutes Intravenous  Once 05/07/24 1200 05/07/24 1504       Data Reviewed: I have personally reviewed following labs and imaging studies CBC: Recent Labs  Lab 05/07/24 1144 05/08/24 0540 05/09/24 0535  WBC 7.6 5.5 6.3  NEUTROABS 5.0  --   --   HGB 13.7 13.3 13.7  HCT 42.7 42.4 43.7  MCV 88.2 89.3 89.7  PLT 134* 148* 154   Basic Metabolic Panel: Recent Labs  Lab 05/07/24 1144  05/08/24 0540 05/09/24 0535  NA 140 141 140  K 4.4 3.9 3.8  CL 103 105 104  CO2 26 26 26   GLUCOSE 213* 172* 230*  BUN 20 22 20   CREATININE 1.68* 1.57* 1.93*  CALCIUM  9.4 8.7* 8.4*   GFR: Estimated Creatinine Clearance: 45 mL/min (A) (by C-G formula based on SCr of 1.93 mg/dL (H)). Liver Function Tests: Recent Labs  Lab 05/07/24 1144 05/08/24 0540  AST 16 12*  ALT 13 <5  ALKPHOS 85 68  BILITOT 1.3* 1.2  PROT 7.6 6.9  ALBUMIN 3.9 3.4*   CBG: Recent Labs  Lab 05/08/24 1631 05/08/24 2148 05/09/24 0826 05/09/24 1209 05/09/24 1627  GLUCAP 210* 247* 202* 226* 157*    Recent Results (from the past 240 hours)  Blood culture (routine x 2)     Status: None (Preliminary result)   Collection Time: 05/07/24 11:43 AM   Specimen: BLOOD  Result Value Ref Range Status   Specimen Description BLOOD BLOOD LEFT HAND  Final   Special Requests   Final    BOTTLES DRAWN AEROBIC AND ANAEROBIC Blood Culture results may not be optimal due to an inadequate volume of blood received in culture bottles   Culture   Final    NO GROWTH 2 DAYS Performed at [facility], [address], [place], [place] [address]    Report Status PENDING  Incomplete  Blood culture (routine x 2)     Status: None (Preliminary result)   Collection Time: 05/07/24 11:43 AM   Specimen: BLOOD  Result  Value Ref Range Status   Specimen Description BLOOD BLOOD RIGHT HAND  Final   Special Requests   Final    BOTTLES DRAWN AEROBIC AND ANAEROBIC Blood Culture results may not be optimal due to an inadequate volume of blood received in culture bottles   Culture   Final    NO GROWTH 2 DAYS Performed at [facility], [address] [place], [place] [address]    Report Status PENDING  Incomplete  Resp panel by RT-PCR (RSV, Flu A&B, Covid) Anterior Nasal Swab     Status: Abnormal   Collection Time: 05/07/24 11:44 AM   Specimen: Anterior Nasal Swab  Result Value Ref Range Status   SARS Coronavirus 2 by RT PCR POSITIVE (A) NEGATIVE Final    Comment: (NOTE) SARS-CoV-2 target nucleic acids are DETECTED.  The SARS-CoV-2 RNA is generally detectable in upper respiratory specimens during the acute phase of infection. Positive results are indicative of the presence of the identified virus, but do not rule out bacterial infection or co-infection with other pathogens not detected by the test. Clinical correlation with patient history and other diagnostic information is necessary to determine patient infection status. The expected result is Negative.  Fact Sheet for Patients: BloggerCourse.com  Fact Sheet for Healthcare Providers: SeriousBroker.it  This test is not yet approved or cleared by the [place]  FDA and  has been authorized for detection and/or diagnosis of SARS-CoV-2 by FDA under an Emergency Use Authorization (EUA).  This EUA will remain in effect (meaning this test can be used) for the duration of  the COVID-19 declaration under Section 564(b)(1) of the A ct, 21 U.S.C. section 360bbb-3(b)(1), unless the authorization is terminated or revoked sooner.     Influenza A by PCR NEGATIVE NEGATIVE Final   Influenza B by PCR NEGATIVE NEGATIVE Final    Comment: (NOTE) The Xpert Xpress SARS-CoV-2/FLU/RSV plus assay is intended  as an aid in the diagnosis of  influenza from Nasopharyngeal swab specimens and should not be used as a sole basis for treatment. Nasal washings and aspirates are unacceptable for Xpert Xpress SARS-CoV-2/FLU/RSV testing.  Fact Sheet for Patients: BloggerCourse.com  Fact Sheet for Healthcare Providers: SeriousBroker.it  This test is not yet approved or cleared by the [place]  FDA and has been authorized for detection and/or diagnosis of SARS-CoV-2 by FDA under an Emergency Use Authorization (EUA). This EUA will remain in effect (meaning this test can be used) for the duration of the COVID-19 declaration under Section 564(b)(1) of the Act, 21 U.S.C. section 360bbb-3(b)(1), unless the authorization is terminated or revoked.     Resp Syncytial Virus by PCR NEGATIVE NEGATIVE Final    Comment: (NOTE) Fact Sheet for Patients: BloggerCourse.com  Fact Sheet for Healthcare Providers: SeriousBroker.it  This test is not yet approved or cleared by the [place]  FDA and has been authorized for detection and/or diagnosis of SARS-CoV-2 by FDA under an Emergency Use Authorization (EUA). This EUA will remain in effect (meaning this test can be used) for the duration of the COVID-19 declaration under Section 564(b)(1) of the Act, 21 U.S.C. section 360bbb-3(b)(1), unless the authorization is terminated or revoked.  Performed at [facility], [address] [place], [place] [address]   MRSA Next Gen by PCR, Nasal     Status: None   Collection Time: 05/08/24  4:47 PM   Specimen: Nasal Swab  Result Value Ref Range Status   MRSA by PCR Next Gen NOT DETECTED NOT DETECTED Final    Comment: (NOTE) The GeneXpert MRSA Assay (FDA approved for NASAL specimens only), is one component of a comprehensive MRSA colonization surveillance program. It is not intended to diagnose MRSA  infection nor to guide or monitor treatment for MRSA infections. Test performance is not FDA approved in patients less than 41 years old. Performed at [facility], [address], [place], [place] [address]      Radiology Studies: DG Chest 1 View Result Date: 05/08/2024 CLINICAL DATA:  Shortness of breath. EXAM: CHEST  1 VIEW COMPARISON:  Chest radiograph dated 05/07/2024 FINDINGS: Mild central vascular congestion. No focal consolidation, pleural effusion or pneumothorax. Stable cardiac silhouette. No acute osseous pathology. IMPRESSION: Mild central vascular congestion. No focal consolidation. Electronically Signed   By: Vanetta Chou M.D.   On: 05/08/2024 14:19    Scheduled Meds:  atorvastatin   20 mg Oral Daily   carbidopa -levodopa   3 tablet Oral TID   enoxaparin  (LOVENOX ) injection  0.5 mg/kg Subcutaneous Q24H   insulin  aspart  0-5 Units Subcutaneous QHS   insulin  aspart  0-9 Units Subcutaneous TID WC   insulin  glargine  14 Units Subcutaneous QHS   latanoprost   1 drop Both Eyes QHS   levETIRAcetam   1,000 mg Oral BID   lisinopril   10 mg Oral BID   tamsulosin   0.8 mg Oral QPC supper   Continuous Infusions:  azithromycin  Stopped (05/09/24 0944)   piperacillin -tazobactam (ZOSYN )  IV 3.375 g (05/09/24 1444)     LOS: 2 days  MDM: Patient is high risk for one or more organ failure.  They necessitate ongoing hospitalization for continued IV therapies and subsequent lab monitoring. Total time spent interpreting labs and vitals, reviewing the medical record, coordinating care amongst consultants and care team members, directly assessing and discussing care with the patient and/or family: 55 min  Laree Lock, MD [facility]  To contact the attending physician between 7A-7P please use Epic Chat. To contact the covering physician  during after hours 7P-7A, please review Amion.  05/09/2024, 6:24 PM   *This document has been created with the assistance of dictation  software. Please excuse typographical errors. *

## 2024-05-09 NOTE — Evaluation (Signed)
 Clinical/Bedside Swallow Evaluation Patient Details  Name: Ralf Konopka. MRN: [id number] Date of Birth: 1950/12/29  Today's Date: 05/09/2024 Time: SLP Start Time (ACUTE ONLY): 1155 SLP Stop Time (ACUTE ONLY): 1210 SLP Time Calculation (min) (ACUTE ONLY): 15 min  Past Medical History:  Past Medical History:  Diagnosis Date   Cataracts, bilateral 02/2011   and suspected glaucoma, to return for f/u, no diabetic retinopathy   CKD stage 3 due to type 2 diabetes mellitus (HCC) 2015   normal renal US , self referred to Dr Marcelino   Complex partial seizures (HCC) 1990   from surgery for R temporal arachnoid cyst s/p drainage, possible continued sz so changed to lamotrigine (Dr. Nelma at [facility])   Depression    found by neuro   Elevated PSA 05/27/2015   Serial monitoring ([place])    Glaucoma 2015   suspect   History of hepatitis A    HLD (hyperlipidemia)    HTN (hypertension)    Knee pain    s/p replacement   SVT (supraventricular tachycardia) (HCC) 1996   Vitamin D  deficiency 03/02/2015   Well controlled type 2 diabetes mellitus with nephropathy (HCC) 1996   established with Dr. Damian endo --> 02/2016 decided to return to PCP for DM care   Past Surgical History:  Past Surgical History:  Procedure Laterality Date   CARDIAC CATHETERIZATION  02/19/2009   No blockages (Dr. 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 Mathilda)   HPI:  Charlie DELENA Carolee Mickey. is a 73 y.o. male who presented to ED with AMS. PMHx significant for  Parkinson's disease, homebound status, HTN, HLD, diabetes, history of seizure, BPH OSA who presented to ED with altered mental status. CXR, 05/08/24, Mild central vascular congestion. No focal consolidation.    Assessment / Plan / Recommendation  Clinical Impression  Pt seen for clinical swallowing evaluation. Pt alert, flat affect. Slow to respond. Distracted by environmental stimuli. Mildly hypophonic vocal quality. Congested baseline cough noted. Pt with s/sx mild oral dysphagia c/b intermittent oral holding with thin liquids only. Improved with verbal cues to swallow. Oral deficits likely impacted by mental status. Pharyngeal swallow appeared functional per clinical assessment. Pt unable to feed self at present, but feeds self at home her family report. Recommend continuation of regular diet with thin liquids with 1:1 assist with feeding and safe swallowing strategies/aspiration precautions including, but not limited to, reducing environmental distractions and verbal/visual cues to swallow. SLP to f/u x1 for diet tolerance and pt/family education. SLP Visit Diagnosis: Dysphagia, oral phase (R13.11)    Aspiration Risk  Mild aspiration risk    Diet Recommendation Regular;Thin liquid    Liquid Administration via: Spoon;Cup;Straw Medication Administration:  (as tolerated) Supervision: Staff to assist with self feeding;Full supervision/cueing for compensatory strategies Compensations: Minimize environmental distractions;Slow rate;Small sips/bites Postural Changes: Seated upright at 90 degrees;Remain upright for at least 30 minutes after po intake    Other  Recommendations Oral Care Recommendations: Oral care BID;Staff/trained caregiver to provide  oral care        Functional Status Assessment Patient has had a recent decline in their functional status and demonstrates the ability to make significant improvements in function in a reasonable and predictable amount of time.  Frequency and Duration  min 2x/week  2 weeks       Prognosis Prognosis for improved oropharyngeal function: Good Barriers to Reach Goals:  (AMS)      Swallow Study   General Date of Onset: 05/07/24 HPI: Antawn Sison. is a 73 y.o. male who presented to ED with AMS. PMHx significant for Parkinson's disease, homebound status, HTN, HLD, diabetes, history of seizure, BPH OSA who presented to ED with altered mental status. CXR, 05/08/24, Mild central vascular congestion. No focal consolidation. Type of Study: Bedside Swallow Evaluation Previous Swallow Assessment: none Diet Prior to this Study: Regular;Thin liquids (Level 0) Temperature Spikes Noted: Yes Respiratory Status: Room air History of Recent Intubation: No Behavior/Cognition: Alert;Requires cueing Oral Cavity Assessment: Within Functional Limits Oral Care Completed by SLP: Recent completion by staff Oral Cavity - Dentition:  (functional) Vision: Functional for self-feeding Self-Feeding Abilities: Total assist Patient Positioning: Upright in bed Baseline Vocal Quality: Low vocal intensity Volitional Cough: Strong;Congested Volitional Swallow: Able to elicit    Oral/Motor/Sensory Function     Ice Chips Ice chips: Not tested   Thin Liquid Thin Liquid: Impaired Presentation: Straw Oral Phase Impairments: Poor awareness of bolus Oral Phase Functional Implications: Oral holding Pharyngeal  Phase Impairments:  (WFL)    Nectar Thick Nectar Thick Liquid: Not tested   Honey Thick Honey Thick Liquid: Not tested   Puree Puree: Within functional limits   Solid     Solid: Within functional limits     Delon Bangs, M.S., CCC-SLP Speech-Language Pathologist [facility] [phone] [place])  Delon CHRISTELLA Bangs 05/09/2024,12:20 PM

## 2024-05-10 DIAGNOSIS — J1282 Pneumonia due to coronavirus disease 2019: Secondary | ICD-10-CM | POA: Diagnosis not present

## 2024-05-10 DIAGNOSIS — U071 COVID-19: Secondary | ICD-10-CM | POA: Diagnosis not present

## 2024-05-10 LAB — BASIC METABOLIC PANEL WITH GFR
Anion gap: 10 (ref 5–15)
BUN: 24 mg/dL — ABNORMAL HIGH (ref 8–23)
CO2: 25 mmol/L (ref 22–32)
Calcium: 8.1 mg/dL — ABNORMAL LOW (ref 8.9–10.3)
Chloride: 105 mmol/L (ref 98–111)
Creatinine, Ser: 1.92 mg/dL — ABNORMAL HIGH (ref 0.61–1.24)
GFR, Estimated: 37 mL/min — ABNORMAL LOW (ref >60)
Glucose, Bld: 264 mg/dL — ABNORMAL HIGH (ref 70–99)
Potassium: 3.5 mmol/L (ref 3.5–5.1)
Sodium: 140 mmol/L (ref 135–145)

## 2024-05-10 LAB — MAGNESIUM: Magnesium: 2.5 mg/dL — ABNORMAL HIGH (ref 1.7–2.4)

## 2024-05-10 LAB — LEGIONELLA PNEUMOPHILA SEROGP 1 UR AG: L. pneumophila Serogp 1 Ur Ag: NEGATIVE

## 2024-05-10 LAB — GLUCOSE, CAPILLARY
Glucose-Capillary: 191 mg/dL — ABNORMAL HIGH (ref 70–99)
Glucose-Capillary: 283 mg/dL — ABNORMAL HIGH (ref 70–99)
Glucose-Capillary: 160 mg/dL — ABNORMAL HIGH (ref 70–99)
Glucose-Capillary: 196 mg/dL — ABNORMAL HIGH (ref 70–99)

## 2024-05-10 MED ORDER — INSULIN GLARGINE 100 UNIT/ML ~~LOC~~ SOLN
14.0000 [IU] | Freq: Every day | SUBCUTANEOUS | Status: DC
  Administered 2024-05-10 – 2024-05-11 (×2): 14 [IU] via SUBCUTANEOUS
  Filled 2024-05-10 (×3): qty 0.14

## 2024-05-10 MED ORDER — LACTATED RINGERS IV SOLN: INTRAVENOUS | Status: AC

## 2024-05-10 MED ORDER — SODIUM CHLORIDE 0.9 % IV SOLN
500.0000 mg | INTRAVENOUS | Status: AC
  Administered 2024-05-10 – 2024-05-11 (×2): 500 mg via INTRAVENOUS
  Filled 2024-05-10 (×2): qty 5

## 2024-05-10 MED ORDER — POTASSIUM CHLORIDE 20 MEQ PO PACK
40.0000 meq | PACK | Freq: Once | ORAL | Status: AC
  Administered 2024-05-10: 40 meq via ORAL
  Filled 2024-05-10: qty 2

## 2024-05-10 MED ORDER — AZITHROMYCIN 250 MG PO TABS: 500.0000 mg | ORAL_TABLET | Freq: Every day | ORAL | Status: DC

## 2024-05-10 NOTE — Evaluation (Signed)
 Physical Therapy Evaluation Patient Details Name: Kirk Bennett. MRN: [id number] DOB: 1951/06/04 Today's Date: 05/10/2024  History of Present Illness  Pt is a 73 year old male presented to ED with altered mental status, admitted with COVID-19 infection, Suspect superimposed bacterial pneumonia, AKI. PMH: Parkinson's disease, homebound status, HTN, HLD, diabetes, history of seizure, BPH OSA  Clinical Impression  PT evaluation completed. Patient lives with spouse at baseline and ambulates in the home with various assistive devices.  Today the patient required +2 person assistance with bed mobility and transfers. Two standing bouts performed using rolling walker with significant assistance required to achieve partial standing. Unable to try walking due to poor standing tolerance and generalized weakness. The patient is not at his baseline level of functional independence. Consider rehabilitation < 3 hours/day after this hospital stay. PT will continue to follow to maximize independence and decrease caregiver burden.       If plan is discharge home, recommend the following: Two people to help with walking and/or transfers;Two people to help with bathing/dressing/bathroom;Assistance with cooking/housework;Help with stairs or ramp for entrance;Assist for transportation   Can travel by private vehicle   No    Equipment Recommendations None recommended by PT  Recommendations for Other Services       Functional Status Assessment Patient has had a recent decline in their functional status and demonstrates the ability to make significant improvements in function in a reasonable and predictable amount of time.     Precautions / Restrictions Precautions Precautions: Fall Recall of Precautions/Restrictions: Impaired Restrictions Weight Bearing Restrictions Per Provider Order: No      Mobility  Bed Mobility Overal bed mobility: Needs Assistance Bed Mobility: Supine to Sit, Sit to Supine      Supine to sit: Max assist, +2 for physical assistance, +2 for safety/equipment, HOB elevated Sit to supine: Max assist, +2 for physical assistance, +2 for safety/equipment   General bed mobility comments: cues for task initiation and sequencing. rolling performed in bed to change bed sheets and provide pericare after bed was soiled with urine and bowel movement. encouraged patient to request assistance if he feels soiled or bed sheets are wet.    Transfers Overall transfer level: Needs assistance Equipment used: Rolling walker (2 wheels) Transfers: Sit to/from Stand Sit to Stand: Max assist, +2 physical assistance           General transfer comment: 2 standing boust performed. significant assistance required for clear buttocks from bed. unable to stand fully despite maximal effort. generalized weakness throughout    Ambulation/Gait               General Gait Details: unable to at this time  Stairs            Wheelchair Mobility     Tilt Bed    Modified Rankin (Stroke Patients Only)       Balance Overall balance assessment: Needs assistance Sitting-balance support: Feet supported Sitting balance-Leahy Scale: Fair     Standing balance support: Bilateral upper extremity supported, During functional activity, Reliant on assistive device for balance Standing balance-Leahy Scale: Zero Standing balance comment: with UE supported on rolling walker, significant +2 person assistance required                             Pertinent Vitals/Pain Pain Assessment Pain Assessment: No/denies pain    Home Living Family/patient expects to be discharged to:: Private residence Living Arrangements: Spouse/significant other  Available Help at Discharge: Family;Available 24 hours/day Type of Home: House Home Access: Level entry       Home Layout: Multi-level;Able to live on main level with bedroom/bathroom Home Equipment: Grab bars - tub/shower;Grab bars -  toilet;Rolling Walker (2 wheels);Cane - single point;Wheelchair - manual      Prior Function Prior Level of Function : Needs assist             Mobility Comments: ambulation with various assistive devices. SPC vs RW or furniture per report ADLs Comments: generally MOD I with ADL with increased time, PRN assist; assist for IADL from family     Extremity/Trunk Assessment   Upper Extremity Assessment Upper Extremity Assessment: Generalized weakness    Lower Extremity Assessment Lower Extremity Assessment: Generalized weakness       Communication   Communication Communication: Impaired Factors Affecting Communication: Difficulty expressing self;Reduced clarity of speech    Cognition Arousal: Alert Behavior During Therapy: Flat affect   PT - Cognitive impairments: Orientation, Sequencing, Initiation   Orientation impairments: Situation, Time                     Following commands: Impaired Following commands impaired: Follows one step commands with increased time     Cueing Cueing Techniques: Verbal cues, Tactile cues, Visual cues     General Comments General comments (skin integrity, edema, etc.): vitals stable throughout. no pain reported.    Exercises Other Exercises Other Exercises: family in the room for part of session   Assessment/Plan    PT Assessment Patient needs continued PT services  PT Problem List Decreased strength;Decreased range of motion;Decreased activity tolerance;Decreased balance;Decreased mobility       PT Treatment Interventions DME instruction;Gait training;Functional mobility training;Therapeutic activities;Therapeutic exercise;Balance training;Neuromuscular re-education;Cognitive remediation;Patient/family education    PT Goals (Current goals can be found in the Care Plan section)  Acute Rehab PT Goals Patient Stated Goal: none stated PT Goal Formulation: With patient Time For Goal Achievement: 05/24/24 Potential to  Achieve Goals: Fair    Frequency Min 2X/week     Co-evaluation PT/OT/SLP Co-Evaluation/Treatment: Yes Reason for Co-Treatment: For patient/therapist safety;Necessary to address cognition/behavior during functional activity;Complexity of the patient's impairments (multi-system involvement) PT goals addressed during session: Mobility/safety with mobility         AM-PAC PT 6 Clicks Mobility  Outcome Measure Help needed turning from your back to your side while in a flat bed without using bedrails?: A Lot Help needed moving from lying on your back to sitting on the side of a flat bed without using bedrails?: Total Help needed moving to and from a bed to a chair (including a wheelchair)?: Total Help needed standing up from a chair using your arms (e.g., wheelchair or bedside chair)?: Total Help needed to walk in hospital room?: Total Help needed climbing 3-5 steps with a railing? : Total 6 Click Score: 7    End of Session   Activity Tolerance: Patient tolerated treatment well Patient left: in bed;with call Pounds/phone within reach;with bed alarm set Nurse Communication: Mobility status PT Visit Diagnosis: Unsteadiness on feet (R26.81);Muscle weakness (generalized) (M62.81)    Time: 8854-8769 PT Time Calculation (min) (ACUTE ONLY): 45 min   Charges:   PT Evaluation $PT Eval Moderate Complexity: 1 Mod PT Treatments $Therapeutic Activity: 8-22 mins PT General Charges $$ ACUTE PT VISIT: 1 Visit         Randine Essex, PT, MPT   Randine LULLA Essex 05/10/2024, 1:50 PM

## 2024-05-10 NOTE — Care Management Important Message (Signed)
 Important Message  Patient Details  Name: Kirk Bennett. MRN: [id number] Date of Birth: 08-Nov-1950   Important Message Given:  Yes - Medicare IM spoke with spouse she's traveling out of the country.     [name] 05/10/2024, 4:32 PM

## 2024-05-10 NOTE — Plan of Care (Signed)
  Problem: Fluid Volume: Goal: Ability to maintain a balanced intake and output will improve Outcome: Progressing   Problem: Nutritional: Goal: Maintenance of adequate nutrition will improve Outcome: Progressing   Problem: Tissue Perfusion: Goal: Adequacy of tissue perfusion will improve Outcome: Progressing   Problem: Education: Goal: Knowledge of General Education information will improve Description: Including pain rating scale, medication(s)/side effects and non-pharmacologic comfort measures Outcome: Progressing   Problem: Health Behavior/Discharge Planning: Goal: Ability to manage health-related needs will improve Outcome: Progressing

## 2024-05-10 NOTE — Progress Notes (Addendum)
 PROGRESS NOTE    Kirk Bennett.  FMW:[id number] DOB: Nov 06, 1950 DOA: 05/07/2024 PCP: Rilla Baller, MD  Chief Complaint  Patient presents with   Altered Mental Status    Hospital Course:  Kirk Bennett. is a pleasant 73 y.o. male with medical history significant for Parkinson's disease, homebound status, HTN, HLD, diabetes, history of seizure, BPH OSA who presented to ED with altered mental status.  Patient is minimally verbal and unable to provide any history.  Brought in due to decreased responsiveness than baseline  Patient was admitted for COVID infection and pneumonia, hospital course as below  Subjective: Was examined at the bedside, very responsive, answering simple questions in one-word Has distended abdomen, x-ray possible ileus but no nausea/vomiting, passing gas, BS +, tolerating diet Tachycardia and tachypnea improving, will continue IV antibiotics Called son Selinda and provided updates  Objective: Vitals:   05/10/24 0944 05/10/24 1000 05/10/24 1400 05/10/24 1650  BP: 110/62  (!) 154/80 (!) 143/84  Pulse: 88 90  (!) 57  Resp: 20 (!) 22    Temp: 98.6 F (37 C) 98.2 F (36.8 C) 98 F (36.7 C) 98.5 F (36.9 C)  TempSrc: Oral Oral Oral   SpO2: 94% 96% 94% 95%  Weight:      Height:        Intake/Output Summary (Last 24 hours) at 05/10/2024 1702 Last data filed at 05/10/2024 1034 Gross per 24 hour  Intake 357.25 ml  Output 2050 ml  Net -1692.75 ml   Filed Weights   05/07/24 1206  Weight: 110.3 kg    Examination: Constitutional: Alert, awake, calm, comfortable, nonverbal HEENT: Neck supple Respiratory: Tachypneic improving, Clear to auscultation B/L, no wheezing, no rales.  Cardiovascular: Regular rate and rhythm, no murmurs / rubs / gallops. No extremity edema. 2+ pedal pulses. No carotid bruits.  Abdomen: Soft, no tenderness, Bowel sounds positive.  Musculoskeletal: no clubbing / cyanosis. Good ROM, no contractures. Normal muscle tone.  Skin:  no rashes, lesions, ulcers. Neurologic: Nonverbal moving all extremities equally and follows very minor commands Psychiatric: Alert and awake  Assessment & Plan:  Principal Problem:   Pneumonia due to COVID-19 virus Active Problems:   Type 2 diabetes mellitus with diabetic nephropathy (HCC)   Essential hypertension, benign   OSA (obstructive sleep apnea)   BPH (benign prostatic hyperplasia)   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HCC)   Pneumonia  COVID-19 infection Suspect superimposed bacterial pneumonia - Not requiring supplemental oxygen, spiking fevers, tachypneic and tachycardic. - BNP not elevated, no evidence of fluid overload on chest x-ray - Does not meet the criteria for remdesivir  or dexamethasone (discussed with pharmacy) - MRSA neg, d-dimer not elevated - Bcx NGTD - Continue IV Zosyn , azithromycin .  Discontinue vancomycin  - Urine Legionella, Streptococcus pneumonia, respiratory cultures pending - Seen by SLP, regular thin liquids - Duonebs prn  AKI on CKD stage 3b - Cr ~ 1.92, and appears to be around 1.5-1.6 - s/p gentle IV fluids, encourage p.o. intake - BS > , straight cath done 09/18, not retaining today - BS PVR every shift and straight cath if > 350 ml - Hold Lisinopril  - Monitor Cr  Ileus - resolved - XR gaseous dilation of bowel loops, no evidence of obstruction.  Could reflect ileus - denies nausea/ vomiting, passing gas, has BS+ - Monitor and replete electrolytes - Resume diet  Parkinson's disease - Continue his home medications carbidopa  levodopa  - Continue supportive care   Diabetes type 2 - He uses metformin , Trulicity , glimepiride  and  Jardiance at home - Will discontinue all those oral hypoglycemic medications and injections. - Inc Lantus  14u, SSI - Continue to monitor blood sugars   History of seizure disorder - Continue Keppra    HTN/HLD - Lisinopril  held due to AKI on CKD   BPH - Continue Flomax   -- PT/OT rec SNF  DVT prophylaxis:  Lovenox  SQ   Code Status: Full Code Disposition: TBD  Consultants:  None  Procedures:  None  Antimicrobials:  Anti-infectives (From admission, onward)    Start     Dose/Rate Route Frequency Ordered Stop   05/10/24 1000  azithromycin  (ZITHROMAX ) tablet 500 mg  Status:  Discontinued        500 mg Oral Daily 05/10/24 0804 05/10/24 0812   05/10/24 1000  azithromycin  (ZITHROMAX ) 500 mg in sodium chloride  0.9 % 250 mL IVPB        500 mg 250 mL/hr over 60 Minutes Intravenous Every 24 hours 05/10/24 0812 05/12/24 0959   05/09/24 1700  vancomycin  (VANCOREADY) IVPB 1500 mg/300 mL  Status:  Discontinued        1,500 mg 150 mL/hr over 120 Minutes Intravenous Every 24 hours 05/08/24 1617 05/09/24 1408   05/09/24 1000  remdesivir  100 mg in sodium chloride  0.9 % 100 mL IVPB  Status:  Discontinued       Placed in Followed by Linked Group   100 mg 200 mL/hr over 30 Minutes Intravenous Daily 05/08/24 1316 05/08/24 1355   05/09/24 0200  piperacillin -tazobactam (ZOSYN ) IVPB 3.375 g        3.375 g 12.5 mL/hr over 240 Minutes Intravenous Every 8 hours 05/08/24 2102     05/08/24 1730  vancomycin  (VANCOCIN ) IVPB 1000 mg/200 mL premix       Placed in And Linked Group   1,000 mg 200 mL/hr over 60 Minutes Intravenous  Once 05/08/24 1617 05/08/24 1835   05/08/24 1730  vancomycin  (VANCOREADY) IVPB 1250 mg/250 mL       Placed in And Linked Group   1,250 mg 166.7 mL/hr over 90 Minutes Intravenous  Once 05/08/24 1617 05/08/24 2023   05/08/24 1700  piperacillin -tazobactam (ZOSYN ) IVPB 3.375 g  Status:  Discontinued        3.375 g 12.5 mL/hr over 240 Minutes Intravenous Every 8 hours 05/08/24 1600 05/08/24 2103   05/08/24 1600  remdesivir  200 mg in sodium chloride  0.9% 250 mL IVPB  Status:  Discontinued       Placed in Followed by Linked Group   200 mg 580 mL/hr over 30 Minutes Intravenous Once 05/08/24 1316 05/08/24 1355   05/08/24 1000  cefTRIAXone  (ROCEPHIN ) 2 g in sodium chloride  0.9 % 100 mL  IVPB  Status:  Discontinued        2 g 200 mL/hr over 30 Minutes Intravenous Every 24 hours 05/07/24 1329 05/08/24 1600   05/08/24 1000  azithromycin  (ZITHROMAX ) 500 mg in sodium chloride  0.9 % 250 mL IVPB  Status:  Discontinued        500 mg 250 mL/hr over 60 Minutes Intravenous Every 24 hours 05/07/24 1329 05/10/24 0801   05/07/24 1215  ceFEPIme  (MAXIPIME ) 2 g in sodium chloride  0.9 % 100 mL IVPB        2 g 200 mL/hr over 30 Minutes Intravenous  Once 05/07/24 1200 05/07/24 1251   05/07/24 1215  metroNIDAZOLE  (FLAGYL ) IVPB 500 mg        500 mg 100 mL/hr over 60 Minutes Intravenous  Once 05/07/24 1200 05/07/24 1400   05/07/24 1215  vancomycin  (VANCOCIN ) IVPB 1000 mg/200 mL premix        1,000 mg 200 mL/hr over 60 Minutes Intravenous  Once 05/07/24 1200 05/07/24 1504       Data Reviewed: I have personally reviewed following labs and imaging studies CBC: Recent Labs  Lab 05/07/24 1144 05/08/24 0540 05/09/24 0535  WBC 7.6 5.5 6.3  NEUTROABS 5.0  --   --   HGB 13.7 13.3 13.7  HCT 42.7 42.4 43.7  MCV 88.2 89.3 89.7  PLT 134* 148* 154   Basic Metabolic Panel: Recent Labs  Lab 05/07/24 1144 05/08/24 0540 05/09/24 0535 05/10/24 0321  NA 140 141 140 140  K 4.4 3.9 3.8 3.5  CL 103 105 104 105  CO2 26 26 26 25   GLUCOSE 213* 172* 230* 264*  BUN 20 22 20  24*  CREATININE 1.68* 1.57* 1.93* 1.92*  CALCIUM  9.4 8.7* 8.4* 8.1*  MG  --   --   --  2.5*   GFR: Estimated Creatinine Clearance: 45.3 mL/min (A) (by C-G formula based on SCr of 1.92 mg/dL (H)). Liver Function Tests: Recent Labs  Lab 05/07/24 1144 05/08/24 0540  AST 16 12*  ALT 13 <5  ALKPHOS 85 68  BILITOT 1.3* 1.2  PROT 7.6 6.9  ALBUMIN 3.9 3.4*   CBG: Recent Labs  Lab 05/09/24 1627 05/09/24 2100 05/10/24 0917 05/10/24 1146 05/10/24 1647  GLUCAP 157* 331* 191* 283* 160*    Recent Results (from the past 240 hours)  Blood culture (routine x 2)     Status: None (Preliminary result)   Collection Time:  05/07/24 11:43 AM   Specimen: BLOOD  Result Value Ref Range Status   Specimen Description BLOOD BLOOD LEFT HAND  Final   Special Requests   Final    BOTTLES DRAWN AEROBIC AND ANAEROBIC Blood Culture results may not be optimal due to an inadequate volume of blood received in culture bottles   Culture   Final    NO GROWTH 3 DAYS Performed at [facility], [address], [place], [place] [address]    Report Status PENDING  Incomplete  Blood culture (routine x 2)     Status: None (Preliminary result)   Collection Time: 05/07/24 11:43 AM   Specimen: BLOOD  Result Value Ref Range Status   Specimen Description BLOOD BLOOD RIGHT HAND  Final   Special Requests   Final    BOTTLES DRAWN AEROBIC AND ANAEROBIC Blood Culture results may not be optimal due to an inadequate volume of blood received in culture bottles   Culture   Final    NO GROWTH 3 DAYS Performed at [facility], [address], [place], [place] [address]    Report Status PENDING  Incomplete  Resp panel by RT-PCR (RSV, Flu A&B, Covid) Anterior Nasal Swab     Status: Abnormal   Collection Time: 05/07/24 11:44 AM   Specimen: Anterior Nasal Swab  Result Value Ref Range Status   SARS Coronavirus 2 by RT PCR POSITIVE (A) NEGATIVE Final    Comment: (NOTE) SARS-CoV-2 target nucleic acids are DETECTED.  The SARS-CoV-2 RNA is generally detectable in upper respiratory specimens during the acute phase of infection. Positive results are indicative of the presence of the identified virus, but do not rule out bacterial infection or co-infection with other pathogens not detected by the test. Clinical correlation with patient history and other diagnostic information is necessary to determine patient infection status. The expected result is Negative.  Fact Sheet for  Patients: BloggerCourse.com  Fact Sheet for Healthcare Providers: SeriousBroker.it  This test is not  yet approved or cleared by the [place]  FDA and  has been authorized for detection and/or diagnosis of SARS-CoV-2 by FDA under an Emergency Use Authorization (EUA).  This EUA will remain in effect (meaning this test can be used) for the duration of  the COVID-19 declaration under Section 564(b)(1) of the A ct, 21 U.S.C. section 360bbb-3(b)(1), unless the authorization is terminated or revoked sooner.     Influenza A by PCR NEGATIVE NEGATIVE Final   Influenza B by PCR NEGATIVE NEGATIVE Final    Comment: (NOTE) The Xpert Xpress SARS-CoV-2/FLU/RSV plus assay is intended as an aid in the diagnosis of influenza from Nasopharyngeal swab specimens and should not be used as a sole basis for treatment. Nasal washings and aspirates are unacceptable for Xpert Xpress SARS-CoV-2/FLU/RSV testing.  Fact Sheet for Patients: BloggerCourse.com  Fact Sheet for Healthcare Providers: SeriousBroker.it  This test is not yet approved or cleared by the [place]  FDA and has been authorized for detection and/or diagnosis of SARS-CoV-2 by FDA under an Emergency Use Authorization (EUA). This EUA will remain in effect (meaning this test can be used) for the duration of the COVID-19 declaration under Section 564(b)(1) of the Act, 21 U.S.C. section 360bbb-3(b)(1), unless the authorization is terminated or revoked.     Resp Syncytial Virus by PCR NEGATIVE NEGATIVE Final    Comment: (NOTE) Fact Sheet for Patients: BloggerCourse.com  Fact Sheet for Healthcare Providers: SeriousBroker.it  This test is not yet approved or cleared by the [place]  FDA and has been authorized for detection and/or diagnosis of SARS-CoV-2 by FDA under an Emergency Use Authorization (EUA). This EUA will remain in effect (meaning this test can be used) for the duration of the COVID-19 declaration under Section 564(b)(1)  of the Act, 21 U.S.C. section 360bbb-3(b)(1), unless the authorization is terminated or revoked.  Performed at [facility], [address] [place], [place] [address]   MRSA Next Gen by PCR, Nasal     Status: None   Collection Time: 05/08/24  4:47 PM   Specimen: Nasal Swab  Result Value Ref Range Status   MRSA by PCR Next Gen NOT DETECTED NOT DETECTED Final    Comment: (NOTE) The GeneXpert MRSA Assay (FDA approved for NASAL specimens only), is one component of a comprehensive MRSA colonization surveillance program. It is not intended to diagnose MRSA infection nor to guide or monitor treatment for MRSA infections. Test performance is not FDA approved in patients less than 65 years old. Performed at [facility], [address], [place], [place] [address]      Radiology Studies: DG Abd 1 View Result Date: 05/09/2024 EXAM: 1 VIEW XRAY OF THE ABDOMEN 05/09/2024 07:46:00 PM COMPARISON: None available. CLINICAL HISTORY: Bowel obstruction FINDINGS: BOWEL: Mild diffuse gaseous dilation of bowel loops. SOFT TISSUES: No opaque urinary calculi. BONES: No acute osseous abnormality. IMPRESSION: 1. Mild diffuse gaseous dilation of bowel loops, without evidence of bowel obstruction. Findings could reflect ileus. Recommend correlation with serial abdominal exams. Electronically signed by: Donnice Mania MD 05/09/2024 08:48 PM EDT RP Workstation: [id number]    Scheduled Meds:  atorvastatin   20 mg Oral Daily   carbidopa -levodopa   3 tablet Oral TID   enoxaparin  (LOVENOX ) injection  0.5 mg/kg Subcutaneous Q24H   insulin  aspart  0-5 Units Subcutaneous QHS   insulin  aspart  0-9 Units Subcutaneous TID WC   insulin  glargine  12 Units Subcutaneous QHS  latanoprost   1 drop Both Eyes QHS   levETIRAcetam   1,000 mg Oral BID   tamsulosin   0.8 mg Oral QPC supper   Continuous Infusions:  azithromycin  500 mg (05/10/24 1033)   lactated ringers  50 mL/hr at 05/10/24 1257    piperacillin -tazobactam (ZOSYN )  IV 3.375 g (05/10/24 1544)     LOS: 3 days  MDM: Patient is high risk for one or more organ failure.  They necessitate ongoing hospitalization for continued IV therapies and subsequent lab monitoring. Total time spent interpreting labs and vitals, reviewing the medical record, coordinating care amongst consultants and care team members, directly assessing and discussing care with the patient and/or family: 55 min  Laree Lock, MD [facility]  To contact the attending physician between 7A-7P please use Epic Chat. To contact the covering physician during after hours 7P-7A, please review Amion.  05/10/2024, 5:02 PM   *This document has been created with the assistance of dictation software. Please excuse typographical errors. *

## 2024-05-10 NOTE — Evaluation (Addendum)
 Occupational Therapy Evaluation Patient Details Name: Kirk Bennett. MRN: [id number] DOB: 06-18-1951 Today's Date: 05/10/2024   History of Present Illness   Pt is a 73 year old male presented to ED with altered mental status, admitted with COVID-19 infection, Suspect superimposed bacterial pneumonia, AKI      PMH significant for Parkinson's disease, homebound status, HTN, HLD, diabetes, history of seizure, BPH OSA     Clinical Impressions Chart reviewed to date, pt greeted semi supine in bed, oriented to self and place. He requires increased time for one step directions. PTA pt is generally MOD I with ADL, has assist for IADL and amb household distances with SPC or RW with increased time. Pt is performing ADL/functional mobility below PLOF and requires significant assist for bed mobility and STS attempts. MAX-TOTAL A for toileting at bed level. Bed saturated with full linen change/bed wiped down.  Please see further details below regarding ADL status. Pt is performing ADL/functional mobility below PLOF, pt will benefit from acute OT to address deficits and to facilitate optimal ADL/functional mobility performance.      If plan is discharge home, recommend the following:   Two people to help with walking and/or transfers;Two people to help with bathing/dressing/bathroom;Supervision due to cognitive status     Functional Status Assessment   Patient has had a recent decline in their functional status and demonstrates the ability to make significant improvements in function in a reasonable and predictable amount of time.     Equipment Recommendations   Other (comment) (defer to next venue of care)     Recommendations for Other Services         Precautions/Restrictions   Precautions Precautions: Fall Recall of Precautions/Restrictions: Impaired Restrictions Weight Bearing Restrictions Per Provider Order: No     Mobility Bed Mobility Overal bed mobility: Needs  Assistance Bed Mobility: Supine to Sit, Sit to Supine     Supine to sit: Max assist, +2 for physical assistance, +2 for safety/equipment, HOB elevated Sit to supine: Max assist, +2 for physical assistance, +2 for safety/equipment        Transfers Overall transfer level: Needs assistance Equipment used: Rolling walker (2 wheels) Transfers: Sit to/from Stand Sit to Stand: Max assist, +2 safety/equipment, +2 physical assistance, From elevated surface (unable to come into full upright standing)                  Balance Overall balance assessment: Needs assistance Sitting-balance support: Feet supported Sitting balance-Leahy Scale: Fair     Standing balance support: Bilateral upper extremity supported, During functional activity, Reliant on assistive device for balance Standing balance-Leahy Scale: Zero                             ADL either performed or assessed with clinical judgement   ADL Overall ADL's : Needs assistance/impaired         Upper Body Bathing: Maximal assistance;Bed level   Lower Body Bathing: Maximal assistance;Bed level   Upper Body Dressing : Maximal assistance;Bed level Upper Body Dressing Details (indicate cue type and reason): doff/donn gown Lower Body Dressing: Total assistance Lower Body Dressing Details (indicate cue type and reason): socks     Toileting- Clothing Manipulation and Hygiene: Maximal assistance;Total assistance;Bed level Toileting - Clothing Manipulation Details (indicate cue type and reason): incontinent BM/bladder             Vision Baseline Vision/History: 1 Wears glasses Patient Visual Report: No  change from baseline       Perception         Praxis         Pertinent Vitals/Pain Pain Assessment Pain Assessment: No/denies pain     Extremity/Trunk Assessment Upper Extremity Assessment Upper Extremity Assessment: Generalized weakness   Lower Extremity Assessment Lower Extremity Assessment:  Generalized weakness       Communication Communication Communication: Impaired Factors Affecting Communication: Difficulty expressing self;Reduced clarity of speech   Cognition Arousal: Alert Behavior During Therapy: Flat affect Cognition: Cognition impaired   Orientation impairments: Situation, date   Memory impairment (select all impairments): Declarative long-term memory Attention impairment (select first level of impairment): Sustained attention Executive functioning impairment (select all impairments): Problem solving, Reasoning                   Following commands: Impaired Following commands impaired: Follows one step commands with increased time     Cueing  General Comments   Cueing Techniques: Verbal cues;Tactile cues;Visual cues  vss throughout   Exercises Other Exercises Other Exercises: edu pt/family re role of OT, role of rehab, discharge recommendations, upright positioning in bed for improved tolerance   Shoulder Instructions      Home Living Family/patient expects to be discharged to:: Private residence Living Arrangements: Spouse/significant other Available Help at Discharge: Family;Available 24 hours/day Type of Home: House Home Access: Level entry     Home Layout: Multi-level;Able to live on main level with bedroom/bathroom     Bathroom Shower/Tub: Producer, television/film/video: Handicapped height Bathroom Accessibility: Yes   Home Equipment: Grab bars - tub/shower;Grab bars - toilet;Rolling Walker (2 wheels);Cane - single point;Wheelchair - manual          Prior Functioning/Environment Prior Level of Function : Needs assist             Mobility Comments: amb with various forms of AD primarily household distances; SPC to car if needed, RW in house or furniture surfs ADLs Comments: generally MOD I with ADL with increased time, PRN assist; assist for IADL from family    OT Problem List: Decreased strength;Impaired balance  (sitting and/or standing);Decreased cognition;Impaired vision/perception;Decreased safety awareness;Decreased activity tolerance;Decreased knowledge of use of DME or AE;Decreased coordination   OT Treatment/Interventions: Self-care/ADL training;Therapeutic exercise;Patient/family education;Balance training;Energy conservation;Therapeutic activities;DME and/or AE instruction;Cognitive remediation/compensation      OT Goals(Current goals can be found in the care plan section)   Acute Rehab OT Goals Patient Stated Goal: get stronger OT Goal Formulation: With patient Time For Goal Achievement: 05/24/24 Potential to Achieve Goals: Good ADL Goals Pt Will Perform Grooming: with modified independence;sitting Pt Will Perform Lower Body Dressing: with modified independence;sitting/lateral leans Pt Will Transfer to Toilet: with modified independence;ambulating Pt Will Perform Toileting - Clothing Manipulation and hygiene: with modified independence;sit to/from stand;sitting/lateral leans   OT Frequency:  Min 2X/week    Co-evaluation PT/OT/SLP Co-Evaluation/Treatment: Yes Reason for Co-Treatment: For patient/therapist safety;Necessary to address cognition/behavior during functional activity;Complexity of the patient's impairments (multi-system involvement)          AM-PAC OT 6 Clicks Daily Activity     Outcome Measure Help from another person eating meals?: A Lot Help from another person taking care of personal grooming?: A Lot Help from another person toileting, which includes using toliet, bedpan, or urinal?: A Lot Help from another person bathing (including washing, rinsing, drying)?: A Lot Help from another person to put on and taking off regular upper body clothing?: A Lot Help from another  person to put on and taking off regular lower body clothing?: A Lot 6 Click Score: 12   End of Session Equipment Utilized During Treatment: Rolling walker (2 wheels) Nurse Communication:  Mobility status  Activity Tolerance: Patient tolerated treatment well Patient left: in bed;with call [place]/phone within reach;with bed alarm set  OT Visit Diagnosis: Other abnormalities of gait and mobility (R26.89);Muscle weakness (generalized) (M62.81)                Time: 8855-8774 OT Time Calculation (min): 41 min Charges:  OT General Charges $OT Visit: 1 Visit OT Evaluation $OT Eval High Complexity: 1 High Therisa Sheffield, OTD OTR/L  05/10/24, 1:40 PM

## 2024-05-10 NOTE — Inpatient Diabetes Management (Signed)
 Inpatient Diabetes Program Recommendations  AACE/ADA: New Consensus Statement on Inpatient Glycemic Control (2015)  Target Ranges:  Prepandial:   less than 140 mg/dL      Peak postprandial:   less than 180 mg/dL (1-2 hours)      Critically ill patients:  140 - 180 mg/dL    Latest Reference Range & Units 05/09/24 08:26 05/09/24 12:09 05/09/24 16:27 05/09/24 21:00  Glucose-Capillary 70 - 99 mg/dL 797 (H)  3 units Novolog   226 (H)  3 units Novolog   157 (H)  2 units Novolog   331 (H)  4 units Novolog   12 units Lantus   (H): Data is abnormally high  Latest Reference Range & Units 05/10/24 09:17  Glucose-Capillary 70 - 99 mg/dL 808 (H)  (H): Data is abnormally high   Home DM Meds:  Amaryl  4 mg daily  Jardiance 25 mg daily  Metformin  500 AM/ 1000 PM  Trulicity  0.75 mg Qweek  FSL3 CGM  Last A1c was 6.6% (June 2025 at the [facility] office in [place])     Current Orders:  Lantus  12 units at HS  Novolog  0-9 units TID ac/hs      MD- Note Lantus  increased to 12 units last PM--CBG 191 this AM  Please consider increasing the Lantus  to 14 units at HS   Please also consider adding low dose Novolog  Meal Coverage: Novolog  3 units TID with meals HOLD if pt NPO HOLD if pt eats <50% meals    --Will follow patient during hospitalization--  Adina Rudolpho Arrow RN, MSN, CDCES Diabetes Coordinator Inpatient Glycemic Control Team Team Pager: [phone] (8a-5p)

## 2024-05-10 NOTE — Progress Notes (Signed)
 SLP Cancellation Note  Patient Details Name: Kirk Bennett. MRN: [id number] DOB: Jun 02, 1951   Cancelled treatment:       Reason Eval/Treat Not Completed: Medical issues which prohibited therapy  Pt is currently NPO for potential ileus. SLP to hold dysphagia intervention at this time. RN aware. Will follow up when pt is able and as schedule allows.   Kirk Breckan Cafiero Clapp, MS, CCC-SLP Speech Language Pathologist Rehab Services; [facility] [phone] (ascom)     Kirk Bennett 05/10/2024, 11:02 AM

## 2024-05-10 NOTE — Progress Notes (Addendum)
 PHARMACIST - PHYSICIAN COMMUNICATION DR:   Jerelene CONCERNING: Antibiotic IV to Oral Route Change Policy  RECOMMENDATION: This patient is receiving Azithromycin  by the intravenous route.  Based on criteria approved by the Pharmacy and Therapeutics Committee, the antibiotic(s) is/are being converted to the equivalent oral dose form(s).   DESCRIPTION: These criteria include: Patient being treated for a respiratory tract infection, urinary tract infection, cellulitis or clostridium difficile associated diarrhea if on metronidazole  The patient is not neutropenic and does not exhibit a GI malabsorption state The patient is eating (either orally or via tube) and/or has been taking other orally administered medications for a least 24 hours The patient is improving clinically and has a Tmax < 100.5  Corban Kistler Rodriguez-Guzman PharmD, BCPS 05/10/2024 8:05 AM   9/19 @ 0814: Azithromycin  changed back to IV dosing. Per RN patient NPO since last night per night MD (not reflected on current diet orders). Will keep azithromycin  IV for the remaining of treatment (2 more doses).

## 2024-05-11 DIAGNOSIS — U071 COVID-19: Secondary | ICD-10-CM | POA: Diagnosis not present

## 2024-05-11 DIAGNOSIS — J1282 Pneumonia due to coronavirus disease 2019: Secondary | ICD-10-CM | POA: Diagnosis not present

## 2024-05-11 LAB — BASIC METABOLIC PANEL WITH GFR
Anion gap: 14 (ref 5–15)
BUN: 21 mg/dL (ref 8–23)
CO2: 22 mmol/L (ref 22–32)
Calcium: 8.5 mg/dL — ABNORMAL LOW (ref 8.9–10.3)
Chloride: 104 mmol/L (ref 98–111)
Creatinine, Ser: 1.43 mg/dL — ABNORMAL HIGH (ref 0.61–1.24)
GFR, Estimated: 52 mL/min — ABNORMAL LOW (ref >60)
Glucose, Bld: 156 mg/dL — ABNORMAL HIGH (ref 70–99)
Potassium: 4.2 mmol/L (ref 3.5–5.1)
Sodium: 140 mmol/L (ref 135–145)

## 2024-05-11 LAB — CBC
HCT: 45.9 % (ref 39.0–52.0)
Hemoglobin: 14.6 g/dL (ref 13.0–17.0)
MCH: 28.5 pg (ref 26.0–34.0)
MCHC: 31.8 g/dL (ref 30.0–36.0)
MCV: 89.6 fL (ref 80.0–100.0)
Platelets: 171 K/uL (ref 150–400)
RBC: 5.12 MIL/uL (ref 4.22–5.81)
RDW: 11.5 % (ref 11.5–15.5)
WBC: 5.2 K/uL (ref 4.0–10.5)
nRBC: 0 % (ref 0.0–0.2)

## 2024-05-11 LAB — GLUCOSE, CAPILLARY
Glucose-Capillary: 148 mg/dL — ABNORMAL HIGH (ref 70–99)
Glucose-Capillary: 190 mg/dL — ABNORMAL HIGH (ref 70–99)
Glucose-Capillary: 179 mg/dL — ABNORMAL HIGH (ref 70–99)
Glucose-Capillary: 222 mg/dL — ABNORMAL HIGH (ref 70–99)

## 2024-05-11 LAB — MAGNESIUM: Magnesium: 2.5 mg/dL — ABNORMAL HIGH (ref 1.7–2.4)

## 2024-05-11 MED ORDER — AMOXICILLIN-POT CLAVULANATE 875-125 MG PO TABS
1.0000 | ORAL_TABLET | Freq: Two times a day (BID) | ORAL | Status: AC
  Administered 2024-05-12 – 2024-05-13 (×4): 1 via ORAL
  Filled 2024-05-11 (×4): qty 1

## 2024-05-11 NOTE — NC FL2 (Signed)
 [place]  MEDICAID [id number] LEVEL OF CARE FORM     IDENTIFICATION  Patient Name: Kirk Bennett. Birthdate: Dec 20, 1950 Sex: male Admission Date (Current Location): 05/07/2024  [facility] and [place] Number:  Chiropodist and Address:  [facility], [address], [place], [place] [address]      Provider Number: [id number]  Attending Physician Name and Address:  Jerelene Critchley, MD  Relative Name and Phone Number:  Mary, Hockey (Spouse)  [phone] (Mobile)    Current Level of Care: Hospital Recommended Level of Care: Skilled Nursing Facility Prior Approval Number:    Date Approved/Denied:   PASRR Number: [id number]  Discharge Plan: SNF    Current Diagnoses: Patient Active Problem List   Diagnosis Date Noted   Pneumonia 05/07/2024   Pneumonia due to COVID-19 virus 05/07/2024   Pedal edema 11/27/2023   PSP (progressive supranuclear palsy) (HCC) 03/23/2023   Visual hallucinations 07/29/2022   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HCC) 01/26/2022   Irregular heart beat 07/19/2021   BPH (benign prostatic hyperplasia) 10/03/2020   Pain due to onychomycosis of toenails of both feet 06/18/2020   CKD stage 3 due to type 2 diabetes mellitus (HCC) 10/23/2019   Type 2 diabetes mellitus with diabetic neuropathy, unspecified (HCC) 10/21/2019   Impaired gait and mobility 10/14/2019   Postural dizziness with presyncope 10/13/2019   Frequent PVCs 10/13/2019   Urge urinary incontinence 09/26/2019   Depressed mood 09/26/2019   Advanced care planning/counseling discussion 09/22/2018   Weakness of both lower extremities 09/22/2018   History of hepatitis 01/05/2018   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   NAFLD (nonalcoholic fatty liver disease) 97/90/7980   Obesity, Class I, BMI 30.0-34.9 (see actual BMI) 09/18/2017   Polyarthralgia 02/15/2017   OSA (obstructive sleep apnea) 02/15/2017   Partial epilepsy with impairment of consciousness (HCC)  01/29/2016   Low vitamin B12 level 05/27/2015   Elevated PSA 05/27/2015   Vitamin D  deficiency 03/02/2015   Other long term (current) drug therapy 03/12/2012   Encounter for general adult medical examination with abnormal findings 04/26/2011   TOTAL KNEE REPLACEMENT, LEFT, HX OF 04/07/2010   Type 2 diabetes mellitus with diabetic nephropathy (HCC) 03/30/2010   Hyperlipidemia associated with type 2 diabetes mellitus (HCC) 03/30/2010   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) 03/30/2010   Essential hypertension, benign 03/30/2010    Orientation RESPIRATION BLADDER Height & Weight     Situation, Place    Incontinent Weight: 243 lb 2.7 oz (110.3 kg) Height:  6' (182.9 cm)  BEHAVIORAL SYMPTOMS/MOOD NEUROLOGICAL BOWEL NUTRITION STATUS     (history of seizure) Incontinent Diet  AMBULATORY STATUS COMMUNICATION OF NEEDS Skin   Total Care Verbally                         Personal Care Assistance Level of Assistance  Bathing, Feeding, Dressing Bathing Assistance: Maximum assistance Feeding assistance: Maximum assistance Dressing Assistance: Maximum assistance     Functional Limitations Info  Sight, Hearing, Speech Sight Info: Impaired (wears glasses) Hearing Info: Adequate Speech Info: Impaired (Reduced clarity of speech)    SPECIAL CARE FACTORS FREQUENCY  OT (By licensed OT), PT (By licensed PT)     PT Frequency: 5x a week OT Frequency: 3x a week            Contractures Contractures Info: Not present    Additional Factors Info  Code Status, Allergies Code Status Info: Full Allergies Info: Bee Venom,Demerol (meperidine Hcl)  Current Medications (05/11/2024):  This is the current hospital active medication list Current Facility-Administered Medications  Medication Dose Route Frequency Provider Last Rate Last Admin   acetaminophen  (TYLENOL ) tablet 650 mg  650 mg Oral Q6H PRN Ponnala, Shruthi, MD   650 mg at 05/10/24 0425    [START ON 05/12/2024] amoxicillin -clavulanate (AUGMENTIN ) 875-125 MG per tablet 1 tablet  1 tablet Oral Q12H Ponnala, Shruthi, MD       atorvastatin  (LIPITOR) tablet 20 mg  20 mg Oral Daily Paudel, Keshab, MD   20 mg at 05/11/24 0925   carbidopa -levodopa  (SINEMET  IR) 25-100 MG per tablet immediate release 3 tablet  3 tablet Oral TID Paudel, Keshab, MD   3 tablet at 05/11/24 0925   enoxaparin  (LOVENOX ) injection 55 mg  0.5 mg/kg Subcutaneous Q24H Paudel, Nena, MD   55 mg at 05/10/24 2137   insulin  aspart (novoLOG ) injection 0-5 Units  0-5 Units Subcutaneous QHS Paudel, Keshab, MD   4 Units at 05/09/24 2104   insulin  aspart (novoLOG ) injection 0-9 Units  0-9 Units Subcutaneous TID WC Paudel, Keshab, MD   2 Units at 05/11/24 1303   insulin  glargine (LANTUS ) injection 14 Units  14 Units Subcutaneous QHS Ponnala, Shruthi, MD   14 Units at 05/10/24 2136   latanoprost  (XALATAN ) 0.005 % ophthalmic solution 1 drop  1 drop Both Eyes QHS Ponnala, Shruthi, MD   1 drop at 05/10/24 2137   levETIRAcetam  (KEPPRA ) tablet 1,000 mg  1,000 mg Oral BID Paudel, Keshab, MD   1,000 mg at 05/11/24 9074   ondansetron  (ZOFRAN ) tablet 4 mg  4 mg Oral Q6H PRN Paudel, Keshab, MD       Or   ondansetron  (ZOFRAN ) injection 4 mg  4 mg Intravenous Q6H PRN Paudel, Keshab, MD       piperacillin -tazobactam (ZOSYN ) IVPB 3.375 g  3.375 g Intravenous Q8H Ponnala, Shruthi, MD 12.5 mL/hr at 05/11/24 1300 3.375 g at 05/11/24 1300   polyethylene glycol (MIRALAX  / GLYCOLAX ) packet 17 g  17 g Oral Daily PRN Paudel, Keshab, MD   17 g at 05/08/24 2212   tamsulosin  (FLOMAX ) capsule 0.8 mg  0.8 mg Oral QPC supper Paudel, Keshab, MD   0.8 mg at 05/10/24 2138     Discharge Medications: Please see discharge summary for a list of discharge medications.  Relevant Imaging Results:  Relevant Lab Results:   Additional Information DOB: [birth year],  SS# [id number]  Edsel DELENA Fischer, LCSW

## 2024-05-11 NOTE — Progress Notes (Addendum)
 Speech Language Pathology Treatment: Dysphagia  Patient Details Name: Kirk Bennett. MRN: [id number] DOB: 19-May-1951 Today's Date: 05/11/2024 Time: 1330-1400 SLP Time Calculation (min) (ACUTE ONLY): 30 min  Assessment / Plan / Recommendation Clinical Impression  Pt seen for follow up dysphagia intervention. Pt afebrile, on room air, WBC WNL, and without updated chest imaging. Family not present. Trials observed from lunch tray, with pt demonstrating mod independence for set up and self feeding regular solids and thin liquids. Intermittent congested cough noted, though not related to swallow function. Oral phase with noted improvement for oral manipulation and efficient clearance. No holding noted. Based on observed PO intake, recommend continued unrestricted diet. General aspiration precautions and supervision recommended. No further SLP services indicated at this time.     HPI HPI: Kirk Bennett. is a 73 y.o. male who presented to ED with AMS. PMHx significant for Parkinson's disease, homebound status, HTN, HLD, diabetes, history of seizure, BPH OSA who presented to ED with altered mental status. CXR, 05/08/24, Mild central vascular congestion. No focal consolidation.      SLP Plan  Discharge SLP treatment due to (comment) (Mod I with PO intake)          Recommendations  Diet recommendations: Regular;Thin liquid Liquids provided via: Cup Medication Administration: Crushed with puree Supervision: Full supervision/cueing for compensatory strategies Compensations: Minimize environmental distractions;Slow rate;Small sips/bites Postural Changes and/or Swallow Maneuvers: Upright 30-60 min after meal;Seated upright 90 degrees                  Oral care BID;Staff/trained caregiver to provide oral care   Intermittent Supervision/Assistance Dysphagia, oral phase (R13.11)     Discharge SLP treatment due to (comment) (Mod I with PO intake)    Kirk Phoenyx Melka Clapp, MS,  CCC-SLP Speech Language Pathologist Rehab Services; [facility] [place] [phone] (ascom)   Kirk Bennett  05/11/2024, 2:10 PM

## 2024-05-11 NOTE — TOC CM/SW Note (Addendum)
..  Transition of Care [facility]) - Inpatient Brief Assessment   Patient Details  Name: Kirk Bennett. MRN: [id number] Date of Birth: 1951/05/28  Transition of Care [facility]) CM/SW Contact:    Edsel DELENA Fischer, LCSW Phone Number: 05/11/2024, 2:30 PM   Clinical Narrative:  TOC spoke with wife.  Wife and pt picked [facility] for rehab placement.  Pt is also a Cytogeneticist.  W[facility] stated [facility].   4:05pm-FL2 completed and submitted to [facility] and [facility] for rehab. Waiting on rely  Transition of Care Asessment: Insurance and Status: Insurance coverage has been reviewed Patient has primary care physician: Yes Home environment has been reviewed: Single family home Prior level of function:: Not documented Prior/Current Home Services: No current home services Social Drivers of Health Review: SDOH reviewed no interventions necessary Readmission risk has been reviewed: Yes Transition of care needs: no transition of care needs at this time

## 2024-05-11 NOTE — Plan of Care (Signed)
  Problem: Fluid Volume: Goal: Ability to maintain a balanced intake and output will improve Outcome: Progressing   Problem: Nutritional: Goal: Maintenance of adequate nutrition will improve Outcome: Progressing   Problem: Tissue Perfusion: Goal: Adequacy of tissue perfusion will improve Outcome: Progressing   Problem: Nutrition: Goal: Adequate nutrition will be maintained Outcome: Progressing   Problem: Coping: Goal: Level of anxiety will decrease Outcome: Progressing   Problem: Pain Managment: Goal: General experience of comfort will improve and/or be controlled Outcome: Progressing

## 2024-05-11 NOTE — Progress Notes (Signed)
 PROGRESS NOTE    Kirk Bennett.  FMW:[id number] DOB: 02/10/1951 DOA: 05/07/2024 PCP: Rilla Baller, MD  Chief Complaint  Patient presents with   Altered Mental Status    Hospital Course:  Kirk Bennett. is a pleasant 73 y.o. male with medical history significant for Parkinson's disease, homebound status, HTN, HLD, diabetes, history of seizure, BPH OSA who presented to ED with altered mental status.  Patient is minimally verbal and unable to provide any history.  Brought in due to decreased responsiveness than baseline  Patient was admitted for COVID infection and pneumonia, hospital course as below  Subjective: Was examined at the bedside, very responsive, answering simple questions in one-word Symptomatically doing much better today, AKI improving. Called son Selinda and provided updates  Objective: Vitals:   05/10/24 2030 05/11/24 0005 05/11/24 0320 05/11/24 1236  BP: (!) 157/69 (!) 144/82 (!) 145/74 137/76  Pulse:  90 87 78  Resp: 20 19 19 16   Temp: 98 F (36.7 C) 98.8 F (37.1 C) 98.2 F (36.8 C) 98.7 F (37.1 C)  TempSrc:      SpO2: 97% 96% 96% 97%  Weight:      Height:        Intake/Output Summary (Last 24 hours) at 05/11/2024 1552 Last data filed at 05/11/2024 1244 Gross per 24 hour  Intake 400 ml  Output 2350 ml  Net -1950 ml   Filed Weights   05/07/24 1206  Weight: 110.3 kg    Examination: Constitutional: Alert, awake, calm, comfortable, nonverbal HEENT: Neck supple Respiratory:  Clear to auscultation B/L, no wheezing, no rales.  Cardiovascular: Regular rate and rhythm, no murmurs / rubs / gallops. No extremity edema. 2+ pedal pulses. No carotid bruits.  Abdomen: Soft, no tenderness, Bowel sounds positive.  Musculoskeletal: no clubbing / cyanosis. Good ROM, no contractures. Normal muscle tone.  Skin: no rashes, lesions, ulcers. Neurologic: Nonverbal moving all extremities equally and follows very minor commands Psychiatric: Alert and  awake  Assessment & Plan:  Principal Problem:   Pneumonia due to COVID-19 virus Active Problems:   Type 2 diabetes mellitus with diabetic nephropathy (HCC)   Essential hypertension, benign   OSA (obstructive sleep apnea)   BPH (benign prostatic hyperplasia)   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HCC)   Pneumonia  COVID-19 infection Sepsis likely due to superimposed bacterial pneumonia - Not requiring supplemental oxygen, spiking fevers, tachypneic and tachycardic - resolved - Does not meet the criteria for remdesivir  or dexamethasone (discussed with pharmacy) - MRSA neg, d-dimer not elevated - Bcx NGTD - Started on IV vancomycin  which was discontinued.  S/p IV Zosyn , azithromycin  changed to p.o. Augmentin  to complete 7 days - Seen by SLP, regular thin liquids  AKI on CKD stage 3a - resolved - Cr ~ 1.92 -> 1.43, and appears to be around 1.5-1.6 - s/p gentle IV fluids, encourage p.o. intake - BS > , straight cath done 09/18, not retaining - Hold Lisinopril  - Monitor Cr  Ileus - resolved - XR gaseous dilation of bowel loops, no evidence of obstruction.  Could reflect ileus - denies nausea/ vomiting, passing gas, has BS+, had BM 09/19 - Monitor and replete electrolytes - Resume diet  Parkinson's disease - Continue his home medications carbidopa  levodopa  - Continue supportive care   Diabetes type 2 - He uses metformin , Trulicity , glimepiride  and Jardiance at home - Will discontinue all those oral hypoglycemic medications and injections. - On Lantus  14u, SSI - Continue to monitor blood sugars   History of seizure  disorder - Continue Keppra    HTN/HLD - Lisinopril  held due to AKI on CKD   BPH - Continue Flomax   -- PT/OT rec SNF  DVT prophylaxis: Lovenox  SQ   Code Status: Full Code Disposition: SNF  Consultants:  None  Procedures:  None  Antimicrobials:  Anti-infectives (From admission, onward)    Start     Dose/Rate Route Frequency Ordered Stop   05/12/24 1000   amoxicillin -clavulanate (AUGMENTIN ) 875-125 MG per tablet 1 tablet        1 tablet Oral Every 12 hours 05/11/24 1021 05/14/24 0959   05/10/24 1000  azithromycin  (ZITHROMAX ) tablet 500 mg  Status:  Discontinued        500 mg Oral Daily 05/10/24 0804 05/10/24 0812   05/10/24 1000  azithromycin  (ZITHROMAX ) 500 mg in sodium chloride  0.9 % 250 mL IVPB        500 mg 250 mL/hr over 60 Minutes Intravenous Every 24 hours 05/10/24 0812 05/11/24 1028   05/09/24 1700  vancomycin  (VANCOREADY) IVPB 1500 mg/300 mL  Status:  Discontinued        1,500 mg 150 mL/hr over 120 Minutes Intravenous Every 24 hours 05/08/24 1617 05/09/24 1408   05/09/24 1000  remdesivir  100 mg in sodium chloride  0.9 % 100 mL IVPB  Status:  Discontinued       Placed in Followed by Linked Group   100 mg 200 mL/hr over 30 Minutes Intravenous Daily 05/08/24 1316 05/08/24 1355   05/09/24 0200  piperacillin -tazobactam (ZOSYN ) IVPB 3.375 g        3.375 g 12.5 mL/hr over 240 Minutes Intravenous Every 8 hours 05/08/24 2102 05/11/24 2359   05/08/24 1730  vancomycin  (VANCOCIN ) IVPB 1000 mg/200 mL premix       Placed in And Linked Group   1,000 mg 200 mL/hr over 60 Minutes Intravenous  Once 05/08/24 1617 05/08/24 1835   05/08/24 1730  vancomycin  (VANCOREADY) IVPB 1250 mg/250 mL       Placed in And Linked Group   1,250 mg 166.7 mL/hr over 90 Minutes Intravenous  Once 05/08/24 1617 05/08/24 2023   05/08/24 1700  piperacillin -tazobactam (ZOSYN ) IVPB 3.375 g  Status:  Discontinued        3.375 g 12.5 mL/hr over 240 Minutes Intravenous Every 8 hours 05/08/24 1600 05/08/24 2103   05/08/24 1600  remdesivir  200 mg in sodium chloride  0.9% 250 mL IVPB  Status:  Discontinued       Placed in Followed by Linked Group   200 mg 580 mL/hr over 30 Minutes Intravenous Once 05/08/24 1316 05/08/24 1355   05/08/24 1000  cefTRIAXone  (ROCEPHIN ) 2 g in sodium chloride  0.9 % 100 mL IVPB  Status:  Discontinued        2 g 200 mL/hr over 30 Minutes  Intravenous Every 24 hours 05/07/24 1329 05/08/24 1600   05/08/24 1000  azithromycin  (ZITHROMAX ) 500 mg in sodium chloride  0.9 % 250 mL IVPB  Status:  Discontinued        500 mg 250 mL/hr over 60 Minutes Intravenous Every 24 hours 05/07/24 1329 05/10/24 0801   05/07/24 1215  ceFEPIme  (MAXIPIME ) 2 g in sodium chloride  0.9 % 100 mL IVPB        2 g 200 mL/hr over 30 Minutes Intravenous  Once 05/07/24 1200 05/07/24 1251   05/07/24 1215  metroNIDAZOLE  (FLAGYL ) IVPB 500 mg        500 mg 100 mL/hr over 60 Minutes Intravenous  Once 05/07/24 1200 05/07/24 1400   05/07/24  1215  vancomycin  (VANCOCIN ) IVPB 1000 mg/200 mL premix        1,000 mg 200 mL/hr over 60 Minutes Intravenous  Once 05/07/24 1200 05/07/24 1504       Data Reviewed: I have personally reviewed following labs and imaging studies CBC: Recent Labs  Lab 05/07/24 1144 05/08/24 0540 05/09/24 0535 05/11/24 0518  WBC 7.6 5.5 6.3 5.2  NEUTROABS 5.0  --   --   --   HGB 13.7 13.3 13.7 14.6  HCT 42.7 42.4 43.7 45.9  MCV 88.2 89.3 89.7 89.6  PLT 134* 148* 154 171   Basic Metabolic Panel: Recent Labs  Lab 05/07/24 1144 05/08/24 0540 05/09/24 0535 05/10/24 0321 05/11/24 0518  NA 140 141 140 140 140  K 4.4 3.9 3.8 3.5 4.2  CL 103 105 104 105 104  CO2 26 26 26 25 22   GLUCOSE 213* 172* 230* 264* 156*  BUN 20 22 20  24* 21  CREATININE 1.68* 1.57* 1.93* 1.92* 1.43*  CALCIUM  9.4 8.7* 8.4* 8.1* 8.5*  MG  --   --   --  2.5* 2.5*   GFR: Estimated Creatinine Clearance: 60.8 mL/min (A) (by C-G formula based on SCr of 1.43 mg/dL (H)). Liver Function Tests: Recent Labs  Lab 05/07/24 1144 05/08/24 0540  AST 16 12*  ALT 13 <5  ALKPHOS 85 68  BILITOT 1.3* 1.2  PROT 7.6 6.9  ALBUMIN 3.9 3.4*   CBG: Recent Labs  Lab 05/10/24 1146 05/10/24 1647 05/10/24 2135 05/11/24 0935 05/11/24 1237  GLUCAP 283* 160* 196* 148* 190*    Recent Results (from the past 240 hours)  Blood culture (routine x 2)     Status: None (Preliminary  result)   Collection Time: 05/07/24 11:43 AM   Specimen: BLOOD  Result Value Ref Range Status   Specimen Description BLOOD BLOOD LEFT HAND  Final   Special Requests   Final    BOTTLES DRAWN AEROBIC AND ANAEROBIC Blood Culture results may not be optimal due to an inadequate volume of blood received in culture bottles   Culture   Final    NO GROWTH 4 DAYS Performed at [facility], [address] [place], [place] [address]    Report Status PENDING  Incomplete  Blood culture (routine x 2)     Status: None (Preliminary result)   Collection Time: 05/07/24 11:43 AM   Specimen: BLOOD  Result Value Ref Range Status   Specimen Description BLOOD BLOOD RIGHT HAND  Final   Special Requests   Final    BOTTLES DRAWN AEROBIC AND ANAEROBIC Blood Culture results may not be optimal due to an inadequate volume of blood received in culture bottles   Culture   Final    NO GROWTH 4 DAYS Performed at [facility], [address], [place], [place] [address]    Report Status PENDING  Incomplete  Resp panel by RT-PCR (RSV, Flu A&B, Covid) Anterior Nasal Swab     Status: Abnormal   Collection Time: 05/07/24 11:44 AM   Specimen: Anterior Nasal Swab  Result Value Ref Range Status   SARS Coronavirus 2 by RT PCR POSITIVE (A) NEGATIVE Final    Comment: (NOTE) SARS-CoV-2 target nucleic acids are DETECTED.  The SARS-CoV-2 RNA is generally detectable in upper respiratory specimens during the acute phase of infection. Positive results are indicative of the presence of the identified virus, but do not rule out bacterial infection or co-infection with other pathogens not detected by the test. Clinical correlation  with patient history and other diagnostic information is necessary to determine patient infection status. The expected result is Negative.  Fact Sheet for Patients: BloggerCourse.com  Fact Sheet for Healthcare  Providers: SeriousBroker.it  This test is not yet approved or cleared by the [place]  FDA and  has been authorized for detection and/or diagnosis of SARS-CoV-2 by FDA under an Emergency Use Authorization (EUA).  This EUA will remain in effect (meaning this test can be used) for the duration of  the COVID-19 declaration under Section 564(b)(1) of the A ct, 21 U.S.C. section 360bbb-3(b)(1), unless the authorization is terminated or revoked sooner.     Influenza A by PCR NEGATIVE NEGATIVE Final   Influenza B by PCR NEGATIVE NEGATIVE Final    Comment: (NOTE) The Xpert Xpress SARS-CoV-2/FLU/RSV plus assay is intended as an aid in the diagnosis of influenza from Nasopharyngeal swab specimens and should not be used as a sole basis for treatment. Nasal washings and aspirates are unacceptable for Xpert Xpress SARS-CoV-2/FLU/RSV testing.  Fact Sheet for Patients: BloggerCourse.com  Fact Sheet for Healthcare Providers: SeriousBroker.it  This test is not yet approved or cleared by the [place]  FDA and has been authorized for detection and/or diagnosis of SARS-CoV-2 by FDA under an Emergency Use Authorization (EUA). This EUA will remain in effect (meaning this test can be used) for the duration of the COVID-19 declaration under Section 564(b)(1) of the Act, 21 U.S.C. section 360bbb-3(b)(1), unless the authorization is terminated or revoked.     Resp Syncytial Virus by PCR NEGATIVE NEGATIVE Final    Comment: (NOTE) Fact Sheet for Patients: BloggerCourse.com  Fact Sheet for Healthcare Providers: SeriousBroker.it  This test is not yet approved or cleared by the [place]  FDA and has been authorized for detection and/or diagnosis of SARS-CoV-2 by FDA under an Emergency Use Authorization (EUA). This EUA will remain in effect (meaning this test can be  used) for the duration of the COVID-19 declaration under Section 564(b)(1) of the Act, 21 U.S.C. section 360bbb-3(b)(1), unless the authorization is terminated or revoked.  Performed at [facility], [address] [place], [place] [address]   MRSA Next Gen by PCR, Nasal     Status: None   Collection Time: 05/08/24  4:47 PM   Specimen: Nasal Swab  Result Value Ref Range Status   MRSA by PCR Next Gen NOT DETECTED NOT DETECTED Final    Comment: (NOTE) The GeneXpert MRSA Assay (FDA approved for NASAL specimens only), is one component of a comprehensive MRSA colonization surveillance program. It is not intended to diagnose MRSA infection nor to guide or monitor treatment for MRSA infections. Test performance is not FDA approved in patients less than 84 years old. Performed at [facility], [address] [place], [place] [address]      Radiology Studies: DG Abd 1 View Result Date: 05/09/2024 EXAM: 1 VIEW XRAY OF THE ABDOMEN 05/09/2024 07:46:00 PM COMPARISON: None available. CLINICAL HISTORY: Bowel obstruction FINDINGS: BOWEL: Mild diffuse gaseous dilation of bowel loops. SOFT TISSUES: No opaque urinary calculi. BONES: No acute osseous abnormality. IMPRESSION: 1. Mild diffuse gaseous dilation of bowel loops, without evidence of bowel obstruction. Findings could reflect ileus. Recommend correlation with serial abdominal exams. Electronically signed by: Donnice Mania MD 05/09/2024 08:48 PM EDT RP Workstation: [id number]    Scheduled Meds:  [START ON 05/12/2024] amoxicillin -clavulanate  1 tablet Oral Q12H   atorvastatin   20 mg Oral Daily   carbidopa -levodopa   3 tablet Oral TID   enoxaparin  (LOVENOX )  injection  0.5 mg/kg Subcutaneous Q24H   insulin  aspart  0-5 Units Subcutaneous QHS   insulin  aspart  0-9 Units Subcutaneous TID WC   insulin  glargine  14 Units Subcutaneous QHS   latanoprost   1 drop Both Eyes QHS   levETIRAcetam   1,000 mg Oral BID   tamsulosin   0.8 mg  Oral QPC supper   Continuous Infusions:  piperacillin -tazobactam (ZOSYN )  IV 3.375 g (05/11/24 1300)     LOS: 4 days  MDM: Patient is high risk for one or more organ failure.  They necessitate ongoing hospitalization for continued IV therapies and subsequent lab monitoring. Total time spent interpreting labs and vitals, reviewing the medical record, coordinating care amongst consultants and care team members, directly assessing and discussing care with the patient and/or family: 55 min  Laree Lock, MD [facility]  To contact the attending physician between 7A-7P please use Epic Chat. To contact the covering physician during after hours 7P-7A, please review Amion.  05/11/2024, 3:52 PM   *This document has been created with the assistance of dictation software. Please excuse typographical errors. *

## 2024-05-12 ENCOUNTER — Inpatient Hospital Stay

## 2024-05-12 DIAGNOSIS — U071 COVID-19: Secondary | ICD-10-CM | POA: Diagnosis not present

## 2024-05-12 DIAGNOSIS — J1282 Pneumonia due to coronavirus disease 2019: Secondary | ICD-10-CM | POA: Diagnosis not present

## 2024-05-12 LAB — GLUCOSE, CAPILLARY
Glucose-Capillary: 120 mg/dL — ABNORMAL HIGH (ref 70–99)
Glucose-Capillary: 122 mg/dL — ABNORMAL HIGH (ref 70–99)
Glucose-Capillary: 140 mg/dL — ABNORMAL HIGH (ref 70–99)
Glucose-Capillary: 182 mg/dL — ABNORMAL HIGH (ref 70–99)
Glucose-Capillary: 265 mg/dL — ABNORMAL HIGH (ref 70–99)

## 2024-05-12 LAB — BASIC METABOLIC PANEL WITH GFR
Anion gap: 10 (ref 5–15)
BUN: 19 mg/dL (ref 8–23)
CO2: 23 mmol/L (ref 22–32)
Calcium: 8.3 mg/dL — ABNORMAL LOW (ref 8.9–10.3)
Chloride: 104 mmol/L (ref 98–111)
Creatinine, Ser: 1.39 mg/dL — ABNORMAL HIGH (ref 0.61–1.24)
GFR, Estimated: 54 mL/min — ABNORMAL LOW (ref >60)
Glucose, Bld: 141 mg/dL — ABNORMAL HIGH (ref 70–99)
Potassium: 3.7 mmol/L (ref 3.5–5.1)
Sodium: 137 mmol/L (ref 135–145)

## 2024-05-12 LAB — CULTURE, BLOOD (ROUTINE X 2)
Culture: NO GROWTH
Culture: NO GROWTH

## 2024-05-12 LAB — MAGNESIUM: Magnesium: 2.1 mg/dL (ref 1.7–2.4)

## 2024-05-12 MED ORDER — INSULIN ASPART 100 UNIT/ML IJ SOLN
0.0000 [IU] | INTRAMUSCULAR | Status: DC
  Administered 2024-05-12: 1 [IU] via SUBCUTANEOUS
  Administered 2024-05-12: 5 [IU] via SUBCUTANEOUS
  Administered 2024-05-12 – 2024-05-13 (×2): 1 [IU] via SUBCUTANEOUS
  Administered 2024-05-13: 3 [IU] via SUBCUTANEOUS
  Administered 2024-05-13: 2 [IU] via SUBCUTANEOUS
  Administered 2024-05-13: 1 [IU] via SUBCUTANEOUS
  Administered 2024-05-14: 7 [IU] via SUBCUTANEOUS
  Administered 2024-05-14: 1 [IU] via SUBCUTANEOUS
  Administered 2024-05-14: 3 [IU] via SUBCUTANEOUS
  Administered 2024-05-14 (×2): 1 [IU] via SUBCUTANEOUS
  Administered 2024-05-14: 7 [IU] via SUBCUTANEOUS
  Administered 2024-05-15: 3 [IU] via SUBCUTANEOUS
  Administered 2024-05-15: 7 [IU] via SUBCUTANEOUS
  Administered 2024-05-15 (×2): 2 [IU] via SUBCUTANEOUS
  Administered 2024-05-15: 5 [IU] via SUBCUTANEOUS
  Administered 2024-05-15: 3 [IU] via SUBCUTANEOUS
  Administered 2024-05-16 (×2): 2 [IU] via SUBCUTANEOUS
  Administered 2024-05-16: 1 [IU] via SUBCUTANEOUS
  Administered 2024-05-16 (×2): 5 [IU] via SUBCUTANEOUS
  Filled 2024-05-12 (×22): qty 1

## 2024-05-12 MED ORDER — INSULIN GLARGINE 100 UNIT/ML ~~LOC~~ SOLN
12.0000 [IU] | Freq: Every day | SUBCUTANEOUS | Status: DC
  Administered 2024-05-12 – 2024-05-13 (×2): 12 [IU] via SUBCUTANEOUS
  Filled 2024-05-12 (×3): qty 0.12

## 2024-05-12 MED ORDER — LACTATED RINGERS IV SOLN: INTRAVENOUS | Status: DC

## 2024-05-12 MED ORDER — LISINOPRIL 10 MG PO TABS
10.0000 mg | ORAL_TABLET | Freq: Every day | ORAL | Status: DC
  Administered 2024-05-13: 10 mg via ORAL
  Filled 2024-05-12: qty 1

## 2024-05-12 NOTE — Plan of Care (Addendum)
 Per 3rd shift RN, who's been RN past 3 days - Patient's abdomen continues to extend and is extremely taut. Dr. Informed and requested to please consider scan to r/o complications.  0800 Orders placed  - placing back to NPO status and repeat scan ordered.

## 2024-05-12 NOTE — Plan of Care (Signed)
  Problem: Fluid Volume: Goal: Ability to maintain a balanced intake and output will improve Outcome: Progressing   Problem: Nutrition: Goal: Adequate nutrition will be maintained Outcome: Progressing   Problem: Pain Managment: Goal: General experience of comfort will improve and/or be controlled Outcome: Progressing   Problem: Activity: Goal: Ability to tolerate increased activity will improve Outcome: Progressing

## 2024-05-12 NOTE — Progress Notes (Signed)
 PROGRESS NOTE    Kirk Bennett.  FMW:[id number] DOB: 09/06/1950 DOA: 05/07/2024 PCP: Rilla Baller, MD  Chief Complaint  Patient presents with   Altered Mental Status    Hospital Course:  Kirk Bennett. is a pleasant 73 y.o. male with medical history significant for Parkinson's disease, homebound status, HTN, HLD, diabetes, history of seizure, BPH OSA who presented to ED with altered mental status.  Patient is minimally verbal and unable to provide any history.  Brought in due to decreased responsiveness than baseline  Patient was admitted for COVID infection and pneumonia, hospital course as below  Subjective: Was examined at the bedside, very responsive, answering simple questions in one-word Patient denies nausea/vomiting.  But abdomen firm, non-tender.  Keep NPO for Ileus Called son Selinda and provided updates  Objective: Vitals:   05/12/24 0041 05/12/24 0302 05/12/24 1214 05/12/24 1753  BP: (!) 144/75 135/76 (!) 161/70 (!) 154/85  Pulse: 76 73 95 82  Resp: 20 20 16 16   Temp: 98.3 F (36.8 C) 98.1 F (36.7 C) 98.2 F (36.8 C) 98.4 F (36.9 C)  TempSrc:      SpO2: 97% 97% 98% 99%  Weight:      Height:        Intake/Output Summary (Last 24 hours) at 05/12/2024 1834 Last data filed at 05/12/2024 1700 Gross per 24 hour  Intake 826.5 ml  Output 2250 ml  Net -1423.5 ml   Filed Weights   05/07/24 1206  Weight: 110.3 kg    Examination: Constitutional: Alert, awake, calm, comfortable, nonverbal HEENT: Neck supple Respiratory:  Clear to auscultation B/L, no wheezing, no rales.  Cardiovascular: Regular rate and rhythm, no murmurs / rubs / gallops. No extremity edema. 2+ pedal pulses. No carotid bruits.  Abdomen: Firm, no tenderness, Bowel sounds positive.  Musculoskeletal: no clubbing / cyanosis. Good ROM, no contractures. Normal muscle tone.  Skin: no rashes, lesions, ulcers. Neurologic: Nonverbal moving all extremities equally and follows very minor  commands Psychiatric: Alert and awake  Assessment & Plan:  Principal Problem:   Pneumonia due to COVID-19 virus Active Problems:   Type 2 diabetes mellitus with diabetic nephropathy (HCC)   Essential hypertension, benign   OSA (obstructive sleep apnea)   BPH (benign prostatic hyperplasia)   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HCC)   Pneumonia  COVID-19 infection Sepsis likely due to superimposed bacterial pneumonia - Not requiring supplemental oxygen, spiking fevers, tachypneic and tachycardic - resolved - Does not meet the criteria for remdesivir  or dexamethasone (discussed with pharmacy) - MRSA neg, d-dimer not elevated - Bcx NGTD - Started on IV vancomycin  which was discontinued.  S/p IV Zosyn , azithromycin  changed to p.o. Augmentin  to complete 7 days - Seen by SLP, regular thin liquids  AKI on CKD stage 3a - resolved - Cr ~ 1.92 -> 1.39, and appears to be around 1.5-1.6 - s/p gentle IV fluids, encourage p.o. intake - BS > , straight cath done 09/18, not retaining - Hold Lisinopril  - Monitor Cr  Ileus - XR gaseous dilation of bowel loops, no evidence of obstruction.  Could reflect ileus - denies nausea/ vomiting, passing gas, has BS+, had BM 09/20 - Abd firm today, Will keep NPO, IV fluids - Monitor and replete electrolytes  Parkinson's disease - Continue his home medications carbidopa  levodopa  - Continue supportive care   Diabetes type 2 - He uses metformin , Trulicity , glimepiride  and Jardiance at home - Will discontinue all those oral hypoglycemic medications and injections. - On Lantus  12u, SSI -  Continue to monitor blood sugars   History of seizure disorder - Continue Keppra    HTN/HLD - Resume Lisinopril  10mg    BPH - Continue Flomax   -- PT/OT rec SNF  DVT prophylaxis: Lovenox  SQ   Code Status: Full Code Disposition: SNF  Consultants:  None  Procedures:  None  Antimicrobials:  Anti-infectives (From admission, onward)    Start     Dose/Rate Route  Frequency Ordered Stop   05/12/24 1000  amoxicillin -clavulanate (AUGMENTIN ) 875-125 MG per tablet 1 tablet        1 tablet Oral Every 12 hours 05/11/24 1021 05/14/24 0959   05/10/24 1000  azithromycin  (ZITHROMAX ) tablet 500 mg  Status:  Discontinued        500 mg Oral Daily 05/10/24 0804 05/10/24 0812   05/10/24 1000  azithromycin  (ZITHROMAX ) 500 mg in sodium chloride  0.9 % 250 mL IVPB        500 mg 250 mL/hr over 60 Minutes Intravenous Every 24 hours 05/10/24 0812 05/11/24 1028   05/09/24 1700  vancomycin  (VANCOREADY) IVPB 1500 mg/300 mL  Status:  Discontinued        1,500 mg 150 mL/hr over 120 Minutes Intravenous Every 24 hours 05/08/24 1617 05/09/24 1408   05/09/24 1000  remdesivir  100 mg in sodium chloride  0.9 % 100 mL IVPB  Status:  Discontinued       Placed in Followed by Linked Group   100 mg 200 mL/hr over 30 Minutes Intravenous Daily 05/08/24 1316 05/08/24 1355   05/09/24 0200  piperacillin -tazobactam (ZOSYN ) IVPB 3.375 g        3.375 g 12.5 mL/hr over 240 Minutes Intravenous Every 8 hours 05/08/24 2102 05/11/24 2359   05/08/24 1730  vancomycin  (VANCOCIN ) IVPB 1000 mg/200 mL premix       Placed in And Linked Group   1,000 mg 200 mL/hr over 60 Minutes Intravenous  Once 05/08/24 1617 05/08/24 1835   05/08/24 1730  vancomycin  (VANCOREADY) IVPB 1250 mg/250 mL       Placed in And Linked Group   1,250 mg 166.7 mL/hr over 90 Minutes Intravenous  Once 05/08/24 1617 05/08/24 2023   05/08/24 1700  piperacillin -tazobactam (ZOSYN ) IVPB 3.375 g  Status:  Discontinued        3.375 g 12.5 mL/hr over 240 Minutes Intravenous Every 8 hours 05/08/24 1600 05/08/24 2103   05/08/24 1600  remdesivir  200 mg in sodium chloride  0.9% 250 mL IVPB  Status:  Discontinued       Placed in Followed by Linked Group   200 mg 580 mL/hr over 30 Minutes Intravenous Once 05/08/24 1316 05/08/24 1355   05/08/24 1000  cefTRIAXone  (ROCEPHIN ) 2 g in sodium chloride  0.9 % 100 mL IVPB  Status:  Discontinued         2 g 200 mL/hr over 30 Minutes Intravenous Every 24 hours 05/07/24 1329 05/08/24 1600   05/08/24 1000  azithromycin  (ZITHROMAX ) 500 mg in sodium chloride  0.9 % 250 mL IVPB  Status:  Discontinued        500 mg 250 mL/hr over 60 Minutes Intravenous Every 24 hours 05/07/24 1329 05/10/24 0801   05/07/24 1215  ceFEPIme  (MAXIPIME ) 2 g in sodium chloride  0.9 % 100 mL IVPB        2 g 200 mL/hr over 30 Minutes Intravenous  Once 05/07/24 1200 05/07/24 1251   05/07/24 1215  metroNIDAZOLE  (FLAGYL ) IVPB 500 mg        500 mg 100 mL/hr over 60 Minutes Intravenous  Once 05/07/24  1200 05/07/24 1400   05/07/24 1215  vancomycin  (VANCOCIN ) IVPB 1000 mg/200 mL premix        1,000 mg 200 mL/hr over 60 Minutes Intravenous  Once 05/07/24 1200 05/07/24 1504       Data Reviewed: I have personally reviewed following labs and imaging studies CBC: Recent Labs  Lab 05/07/24 1144 05/08/24 0540 05/09/24 0535 05/11/24 0518  WBC 7.6 5.5 6.3 5.2  NEUTROABS 5.0  --   --   --   HGB 13.7 13.3 13.7 14.6  HCT 42.7 42.4 43.7 45.9  MCV 88.2 89.3 89.7 89.6  PLT 134* 148* 154 171   Basic Metabolic Panel: Recent Labs  Lab 05/08/24 0540 05/09/24 0535 05/10/24 0321 05/11/24 0518 05/12/24 0549  NA 141 140 140 140 137  K 3.9 3.8 3.5 4.2 3.7  CL 105 104 105 104 104  CO2 26 26 25 22 23   GLUCOSE 172* 230* 264* 156* 141*  BUN 22 20 24* 21 19  CREATININE 1.57* 1.93* 1.92* 1.43* 1.39*  CALCIUM  8.7* 8.4* 8.1* 8.5* 8.3*  MG  --   --  2.5* 2.5* 2.1   GFR: Estimated Creatinine Clearance: 62.5 mL/min (A) (by C-G formula based on SCr of 1.39 mg/dL (H)). Liver Function Tests: Recent Labs  Lab 05/07/24 1144 05/08/24 0540  AST 16 12*  ALT 13 <5  ALKPHOS 85 68  BILITOT 1.3* 1.2  PROT 7.6 6.9  ALBUMIN 3.9 3.4*   CBG: Recent Labs  Lab 05/11/24 1712 05/11/24 2157 05/12/24 0828 05/12/24 1214 05/12/24 1753  GLUCAP 179* 222* 140* 265* 182*    Recent Results (from the past 240 hours)  Blood culture (routine x  2)     Status: None   Collection Time: 05/07/24 11:43 AM   Specimen: BLOOD  Result Value Ref Range Status   Specimen Description BLOOD BLOOD LEFT HAND  Final   Special Requests   Final    BOTTLES DRAWN AEROBIC AND ANAEROBIC Blood Culture results may not be optimal due to an inadequate volume of blood received in culture bottles   Culture   Final    NO GROWTH 5 DAYS Performed at [facility], [address] [place], [place] [address]    Report Status 05/12/2024 FINAL  Final  Blood culture (routine x 2)     Status: None   Collection Time: 05/07/24 11:43 AM   Specimen: BLOOD  Result Value Ref Range Status   Specimen Description BLOOD BLOOD RIGHT HAND  Final   Special Requests   Final    BOTTLES DRAWN AEROBIC AND ANAEROBIC Blood Culture results may not be optimal due to an inadequate volume of blood received in culture bottles   Culture   Final    NO GROWTH 5 DAYS Performed at [facility], [address] [place], [place] [address]    Report Status 05/12/2024 FINAL  Final  Resp panel by RT-PCR (RSV, Flu A&B, Covid) Anterior Nasal Swab     Status: Abnormal   Collection Time: 05/07/24 11:44 AM   Specimen: Anterior Nasal Swab  Result Value Ref Range Status   SARS Coronavirus 2 by RT PCR POSITIVE (A) NEGATIVE Final    Comment: (NOTE) SARS-CoV-2 target nucleic acids are DETECTED.  The SARS-CoV-2 RNA is generally detectable in upper respiratory specimens during the acute phase of infection. Positive results are indicative of the presence of the identified virus, but do not rule out bacterial infection or co-infection with other pathogens not detected by the test.  Clinical correlation with patient history and other diagnostic information is necessary to determine patient infection status. The expected result is Negative.  Fact Sheet for Patients: BloggerCourse.com  Fact Sheet for Healthcare  Providers: SeriousBroker.it  This test is not yet approved or cleared by the [place]  FDA and  has been authorized for detection and/or diagnosis of SARS-CoV-2 by FDA under an Emergency Use Authorization (EUA).  This EUA will remain in effect (meaning this test can be used) for the duration of  the COVID-19 declaration under Section 564(b)(1) of the A ct, 21 U.S.C. section 360bbb-3(b)(1), unless the authorization is terminated or revoked sooner.     Influenza A by PCR NEGATIVE NEGATIVE Final   Influenza B by PCR NEGATIVE NEGATIVE Final    Comment: (NOTE) The Xpert Xpress SARS-CoV-2/FLU/RSV plus assay is intended as an aid in the diagnosis of influenza from Nasopharyngeal swab specimens and should not be used as a sole basis for treatment. Nasal washings and aspirates are unacceptable for Xpert Xpress SARS-CoV-2/FLU/RSV testing.  Fact Sheet for Patients: BloggerCourse.com  Fact Sheet for Healthcare Providers: SeriousBroker.it  This test is not yet approved or cleared by the [place]  FDA and has been authorized for detection and/or diagnosis of SARS-CoV-2 by FDA under an Emergency Use Authorization (EUA). This EUA will remain in effect (meaning this test can be used) for the duration of the COVID-19 declaration under Section 564(b)(1) of the Act, 21 U.S.C. section 360bbb-3(b)(1), unless the authorization is terminated or revoked.     Resp Syncytial Virus by PCR NEGATIVE NEGATIVE Final    Comment: (NOTE) Fact Sheet for Patients: BloggerCourse.com  Fact Sheet for Healthcare Providers: SeriousBroker.it  This test is not yet approved or cleared by the [place]  FDA and has been authorized for detection and/or diagnosis of SARS-CoV-2 by FDA under an Emergency Use Authorization (EUA). This EUA will remain in effect (meaning this test can be  used) for the duration of the COVID-19 declaration under Section 564(b)(1) of the Act, 21 U.S.C. section 360bbb-3(b)(1), unless the authorization is terminated or revoked.  Performed at [facility], [address] [place], [place] [address]   MRSA Next Gen by PCR, Nasal     Status: None   Collection Time: 05/08/24  4:47 PM   Specimen: Nasal Swab  Result Value Ref Range Status   MRSA by PCR Next Gen NOT DETECTED NOT DETECTED Final    Comment: (NOTE) The GeneXpert MRSA Assay (FDA approved for NASAL specimens only), is one component of a comprehensive MRSA colonization surveillance program. It is not intended to diagnose MRSA infection nor to guide or monitor treatment for MRSA infections. Test performance is not FDA approved in patients less than 65 years old. Performed at [facility], [address], [place], [place] [address]      Radiology Studies: DG Abd 1 View Result Date: 05/12/2024 CLINICAL DATA:  [id number] Bowel obstruction [facility]) [phone] EXAM: ABDOMEN - 1 VIEW COMPARISON:  X-ray abdomen 04/08/2024 FINDINGS: Limited evaluation due to overlapping osseous structures and overlying soft tissues. Question gaseous distension of several loops of small bowel within the right abdomen with no definite dilatation. No radio-opaque calculi or other significant radiographic abnormality are seen. IMPRESSION: Question gaseous distension of several loops of small bowel within the right abdomen with no definite dilatation. Limited evaluation due to overlapping osseous structures and overlying soft tissues. Electronically Signed   By: Morgane  Naveau M.D.   On: 05/12/2024 11:14    Scheduled Meds:  amoxicillin -clavulanate  1 tablet Oral Q12H   atorvastatin   20 mg Oral Daily   carbidopa -levodopa   3 tablet Oral TID   enoxaparin  (LOVENOX ) injection  0.5 mg/kg Subcutaneous Q24H   insulin  aspart  0-9 Units Subcutaneous Q4H   insulin  glargine  12 Units Subcutaneous QHS   latanoprost    1 drop Both Eyes QHS   levETIRAcetam   1,000 mg Oral BID   tamsulosin   0.8 mg Oral QPC supper   Continuous Infusions:  lactated ringers  75 mL/hr at 05/12/24 1700     LOS: 5 days  MDM: Patient is high risk for one or more organ failure.  They necessitate ongoing hospitalization for continued IV therapies and subsequent lab monitoring. Total time spent interpreting labs and vitals, reviewing the medical record, coordinating care amongst consultants and care team members, directly assessing and discussing care with the patient and/or family: 55 min  Laree Lock, MD [facility]  To contact the attending physician between 7A-7P please use Epic Chat. To contact the covering physician during after hours 7P-7A, please review Amion.  05/12/2024, 6:34 PM   *This document has been created with the assistance of dictation software. Please excuse typographical errors. *

## 2024-05-13 DIAGNOSIS — J1282 Pneumonia due to coronavirus disease 2019: Secondary | ICD-10-CM | POA: Diagnosis not present

## 2024-05-13 DIAGNOSIS — U071 COVID-19: Secondary | ICD-10-CM | POA: Diagnosis not present

## 2024-05-13 LAB — BASIC METABOLIC PANEL WITH GFR
Anion gap: 7 (ref 5–15)
BUN: 16 mg/dL (ref 8–23)
CO2: 24 mmol/L (ref 22–32)
Calcium: 8.3 mg/dL — ABNORMAL LOW (ref 8.9–10.3)
Chloride: 104 mmol/L (ref 98–111)
Creatinine, Ser: 1.43 mg/dL — ABNORMAL HIGH (ref 0.61–1.24)
GFR, Estimated: 52 mL/min — ABNORMAL LOW (ref >60)
Glucose, Bld: 140 mg/dL — ABNORMAL HIGH (ref 70–99)
Potassium: 3.4 mmol/L — ABNORMAL LOW (ref 3.5–5.1)
Sodium: 135 mmol/L (ref 135–145)

## 2024-05-13 LAB — GLUCOSE, CAPILLARY
Glucose-Capillary: 134 mg/dL — ABNORMAL HIGH (ref 70–99)
Glucose-Capillary: 130 mg/dL — ABNORMAL HIGH (ref 70–99)
Glucose-Capillary: 144 mg/dL — ABNORMAL HIGH (ref 70–99)
Glucose-Capillary: 176 mg/dL — ABNORMAL HIGH (ref 70–99)
Glucose-Capillary: 229 mg/dL — ABNORMAL HIGH (ref 70–99)

## 2024-05-13 LAB — MAGNESIUM: Magnesium: 2 mg/dL (ref 1.7–2.4)

## 2024-05-13 MED ORDER — POTASSIUM CHLORIDE 10 MEQ/100ML IV SOLN
10.0000 meq | INTRAVENOUS | Status: DC
  Administered 2024-05-13 (×2): 10 meq via INTRAVENOUS
  Filled 2024-05-13 (×4): qty 100

## 2024-05-13 MED ORDER — LISINOPRIL 10 MG PO TABS
10.0000 mg | ORAL_TABLET | Freq: Every day | ORAL | Status: DC
  Administered 2024-05-14 – 2024-05-16 (×3): 10 mg via ORAL
  Filled 2024-05-13 (×3): qty 1

## 2024-05-13 MED ORDER — POTASSIUM CHLORIDE CRYS ER 20 MEQ PO TBCR
40.0000 meq | EXTENDED_RELEASE_TABLET | Freq: Once | ORAL | Status: AC
  Administered 2024-05-13: 40 meq via ORAL
  Filled 2024-05-13: qty 2

## 2024-05-13 NOTE — Plan of Care (Signed)
  Problem: Coping: Goal: Ability to adjust to condition or change in health will improve Outcome: Progressing   Problem: Fluid Volume: Goal: Ability to maintain a balanced intake and output will improve Outcome: Progressing   Problem: Activity: Goal: Risk for activity intolerance will decrease Outcome: Progressing   Problem: Elimination: Goal: Will not experience complications related to bowel motility Outcome: Progressing

## 2024-05-13 NOTE — Plan of Care (Signed)
  Problem: Education: Goal: Ability to describe self-care measures that may prevent or decrease complications (Diabetes Survival Skills Education) will improve Outcome: Progressing   Problem: Fluid Volume: Goal: Ability to maintain a balanced intake and output will improve Outcome: Progressing   Problem: Coping: Goal: Ability to adjust to condition or change in health will improve Outcome: Progressing   Problem: Health Behavior/Discharge Planning: Goal: Ability to identify and utilize available resources and services will improve Outcome: Progressing

## 2024-05-13 NOTE — Progress Notes (Signed)
 Patient ABD remains distended. Denies pain or discomfort. NPO continues per orders.

## 2024-05-13 NOTE — Progress Notes (Signed)
 Physical Therapy Treatment Patient Details Name: Kirk Bennett. MRN: [id number] DOB: 1951/06/11 Today's Date: 05/13/2024   History of Present Illness Pt is a 73 year old male presented to ED with altered mental status, admitted with COVID-19 infection, suspect superimposed bacterial pneumonia, AKI. PMH: Parkinson's disease, homebound status, HTN, HLD, diabetes, history of seizure, BPH, and OSA.    PT Comments  Pt with flat affect and very little verbal communication during the session but was overall pleasant and motivated to participate during the session.  Pt followed most 1-step commands but did need extra time and cuing throughout.  Pt required only +1 Mod A with sup to sit this session but continued to require heavy +2 physical assistance going from sit to sup.  Pt was able to perform multiple stands with +2 assist and cues for sequencing. In standing pt required lean on side of be along with physical assist to prevent posterior LOB but on one instance was able to take 2-3 small, shuffling steps laterally.  Pt making progress towards goals but continues to require heavy assistance with functional tasks and is at a very high risk for falls.  Pt will benefit from continued PT services upon discharge to safely address deficits listed in patient problem list for decreased caregiver assistance and eventual return to PLOF.       If plan is discharge home, recommend the following: Two people to help with walking and/or transfers;Two people to help with bathing/dressing/bathroom;Assistance with cooking/housework;Help with stairs or ramp for entrance;Assist for transportation   Can travel by private vehicle     No  Equipment Recommendations  Other (comment) (TBD at next venue of care)    Recommendations for Other Services       Precautions / Restrictions Precautions Precautions: Fall Recall of Precautions/Restrictions: Impaired Restrictions Weight Bearing Restrictions Per Provider Order:  No     Mobility  Bed Mobility Overal bed mobility: Needs Assistance Bed Mobility: Supine to Sit, Sit to Supine     Supine to sit: HOB elevated, Mod assist Sit to supine: Max assist, +2 for physical assistance   General bed mobility comments: Only +1 Mod A needed going from sup to sit mostly for trunk control and final positioning but +2 Max A required during sit to sup for BLE and trunk control    Transfers Overall transfer level: Needs assistance Equipment used: Rolling walker (2 wheels) Transfers: Sit to/from Stand Sit to Stand: +2 physical assistance, Mod assist, Max assist, From elevated surface           General transfer comment: Pt performed 3 bouts of sit to stand with +2 mod to max A needed to come to standing from an elevated surface with max static standing tolerance around 45-60 sec with mod A to maintain balance along with pt leaning somewhat on the side of the bed    Ambulation/Gait Ambulation/Gait assistance: Mod assist, +2 physical assistance Gait Distance (Feet): 1 Feet Assistive device: Rolling walker (2 wheels) Gait Pattern/deviations: Step-to pattern, Trunk flexed, Decreased step length - right, Decreased step length - left Gait velocity: decreased     General Gait Details: With cues for sequencing and +2 mod A pt able to take several small, shuffling steps laterally at the EOB before fatiguing and needing to return to sitting   Stairs             Wheelchair Mobility     Tilt Bed    Modified Rankin (Stroke Patients Only)  Balance Overall balance assessment: Needs assistance Sitting-balance support: Feet supported Sitting balance-Leahy Scale: Fair     Standing balance support: Bilateral upper extremity supported, During functional activity, Reliant on assistive device for balance Standing balance-Leahy Scale: Poor                              Communication Communication Communication: Impaired Factors Affecting  Communication: Difficulty expressing self;Reduced clarity of speech  Cognition Arousal: Alert Behavior During Therapy: Flat affect                             Following commands: Impaired Following commands impaired: Follows one step commands with increased time    Cueing Cueing Techniques: Verbal cues, Tactile cues, Visual cues  Exercises Total Joint Exercises Ankle Circles/Pumps: AROM, Strengthening, Both, 5 reps, 10 reps Quad Sets: Strengthening, Both, 5 reps, 10 reps Hip ABduction/ADduction: AAROM, Strengthening, Both, 10 reps Straight Leg Raises: AAROM, Strengthening, Both, 10 reps Other Exercises Other Exercises: Static sitting unsupported at the EOB x 12 min for improved activity tolerance and core strength Other Exercises: Static standing at the EOB 3 x 45-60 sec    General Comments        Pertinent Vitals/Pain Pain Assessment Pain Assessment: No/denies pain    Home Living                          Prior Function            PT Goals (current goals can now be found in the care plan section) Progress towards PT goals: Progressing toward goals    Frequency    Min 2X/week      PT Plan      Co-evaluation              AM-PAC PT 6 Clicks Mobility   Outcome Measure  Help needed turning from your back to your side while in a flat bed without using bedrails?: A Lot Help needed moving from lying on your back to sitting on the side of a flat bed without using bedrails?: A Lot Help needed moving to and from a bed to a chair (including a wheelchair)?: Total Help needed standing up from a chair using your arms (e.g., wheelchair or bedside chair)?: Total Help needed to walk in hospital room?: Total Help needed climbing 3-5 steps with a railing? : Total 6 Click Score: 8    End of Session Equipment Utilized During Treatment: Gait belt Activity Tolerance: Patient tolerated treatment well Patient left: in bed;with call Andreas/phone within  reach;with bed alarm set;with SCD's reapplied;with nursing/sitter in room Nurse Communication: Mobility status PT Visit Diagnosis: Unsteadiness on feet (R26.81);Muscle weakness (generalized) (M62.81)     Time: 8442-8356 PT Time Calculation (min) (ACUTE ONLY): 46 min  Charges:    $Therapeutic Exercise: 8-22 mins $Therapeutic Activity: 23-37 mins PT General Charges $$ ACUTE PT VISIT: 1 Visit                     D. Glendia Bertin PT, DPT 05/13/24, 5:09 PM

## 2024-05-13 NOTE — Progress Notes (Signed)
 PROGRESS NOTE    Kirk Bennett.  FMW:[id number] DOB: [birth year] DOA: 05/07/2024 PCP: Rilla Baller, MD  Chief Complaint  Patient presents with   Altered Mental Status    Hospital Course:  Kirk Bennett. is a pleasant 73 y.o. male with medical history significant for Parkinson's disease, homebound status, HTN, HLD, diabetes, history of seizure, BPH OSA who presented to ED with altered mental status.  Patient is minimally verbal and unable to provide any history.  Brought in due to decreased responsiveness than baseline  Patient was admitted for COVID infection and pneumonia, hospital course as below  Subjective: Was examined at the bedside, very responsive, answering simple questions in one-word Patient denies nausea/vomiting, passing gas, BS+. Abd soft compared to yday Start CLD Called son Selinda and provided updates  Objective: Vitals:   05/13/24 0411 05/13/24 0828 05/13/24 1154 05/13/24 1506  BP: (!) 152/100 136/79 (!) 141/86 (!) 149/78  Pulse: 86 84 89 75  Resp: 20 16  16   Temp: 98.6 F (37 C) 98.3 F (36.8 C)  98.3 F (36.8 C)  TempSrc:      SpO2: 97% 98% 96% 100%  Weight:      Height:        Intake/Output Summary (Last 24 hours) at 05/13/2024 1738 Last data filed at 05/13/2024 1734 Gross per 24 hour  Intake 811.28 ml  Output 2250 ml  Net -1438.72 ml   Filed Weights   05/07/24 1206  Weight: 110.3 kg    Examination: Constitutional: Alert, awake, calm, comfortable, nonverbal HEENT: Neck supple Respiratory:  Clear to auscultation B/L, no wheezing, no rales.  Cardiovascular: Regular rate and rhythm, no murmurs / rubs / gallops. No extremity edema. 2+ pedal pulses. No carotid bruits.  Abdomen: Firm, no tenderness, Bowel sounds positive.  Musculoskeletal: no clubbing / cyanosis. Good ROM, no contractures. Normal muscle tone.  Skin: no rashes, lesions, ulcers. Neurologic: Nonverbal moving all extremities equally and follows very minor  commands Psychiatric: Alert and awake  Assessment & Plan:  Principal Problem:   Pneumonia due to COVID-19 virus Active Problems:   Type 2 diabetes mellitus with diabetic nephropathy (HCC)   Essential hypertension, benign   OSA (obstructive sleep apnea)   BPH (benign prostatic hyperplasia)   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HCC)   Pneumonia  COVID-19 infection Sepsis likely due to superimposed bacterial pneumonia - Not requiring supplemental oxygen, spiking fevers, tachypneic and tachycardic - resolved - Does not meet the criteria for remdesivir  or dexamethasone (discussed with pharmacy) - MRSA neg, d-dimer not elevated - Bcx NGTD - Started on IV vancomycin  which was discontinued.  S/p IV Zosyn , azithromycin  changed to p.o. Augmentin  to complete 7 days - Seen by SLP, regular thin liquids  AKI on CKD stage 3a - resolved - Cr ~ 1.92 -> 1.43, and appears to be around 1.5-1.6 - s/p gentle IV fluids, encourage p.o. intake - Monitor Cr  Ileus - improving - XR gaseous dilation of bowel loops, no evidence of obstruction.  Could reflect ileus - denies nausea/ vomiting, passing gas, has BS+, had BM 09/20 - Abd soft, compared to yday - start CLD, advance to Full liquids if tolerating - Monitor and replete electrolytes  Hypokalemia - Mag normal - Monitor and replete as needed  Parkinson's disease - Continue his home medications carbidopa  levodopa  - Continue supportive care   Diabetes type 2 - He uses metformin , Trulicity , glimepiride  and Jardiance at home - Will discontinue all those oral hypoglycemic medications and injections. - On Lantus   12u, SSI - Continue to monitor blood sugars   History of seizure disorder - Continue Keppra    HTN/HLD - Resume Lisinopril  10mg    BPH - Continue Flomax   -- PT/OT rec SNF  DVT prophylaxis: Lovenox  SQ   Code Status: Full Code Disposition: SNF  Consultants:  None  Procedures:  None  Antimicrobials:  Anti-infectives (From admission,  onward)    Start     Dose/Rate Route Frequency Ordered Stop   05/12/24 1000  amoxicillin -clavulanate (AUGMENTIN ) 875-125 MG per tablet 1 tablet        1 tablet Oral Every 12 hours 05/11/24 1021 05/14/24 0959   05/10/24 1000  azithromycin  (ZITHROMAX ) tablet 500 mg  Status:  Discontinued        500 mg Oral Daily 05/10/24 0804 05/10/24 0812   05/10/24 1000  azithromycin  (ZITHROMAX ) 500 mg in sodium chloride  0.9 % 250 mL IVPB        500 mg 250 mL/hr over 60 Minutes Intravenous Every 24 hours 05/10/24 0812 05/11/24 1028   05/09/24 1700  vancomycin  (VANCOREADY) IVPB 1500 mg/300 mL  Status:  Discontinued        1,500 mg 150 mL/hr over 120 Minutes Intravenous Every 24 hours 05/08/24 1617 05/09/24 1408   05/09/24 1000  remdesivir  100 mg in sodium chloride  0.9 % 100 mL IVPB  Status:  Discontinued       Placed in Followed by Linked Group   100 mg 200 mL/hr over 30 Minutes Intravenous Daily 05/08/24 1316 05/08/24 1355   05/09/24 0200  piperacillin -tazobactam (ZOSYN ) IVPB 3.375 g        3.375 g 12.5 mL/hr over 240 Minutes Intravenous Every 8 hours 05/08/24 2102 05/11/24 2359   05/08/24 1730  vancomycin  (VANCOCIN ) IVPB 1000 mg/200 mL premix       Placed in And Linked Group   1,000 mg 200 mL/hr over 60 Minutes Intravenous  Once 05/08/24 1617 05/08/24 1835   05/08/24 1730  vancomycin  (VANCOREADY) IVPB 1250 mg/250 mL       Placed in And Linked Group   1,250 mg 166.7 mL/hr over 90 Minutes Intravenous  Once 05/08/24 1617 05/08/24 2023   05/08/24 1700  piperacillin -tazobactam (ZOSYN ) IVPB 3.375 g  Status:  Discontinued        3.375 g 12.5 mL/hr over 240 Minutes Intravenous Every 8 hours 05/08/24 1600 05/08/24 2103   05/08/24 1600  remdesivir  200 mg in sodium chloride  0.9% 250 mL IVPB  Status:  Discontinued       Placed in Followed by Linked Group   200 mg 580 mL/hr over 30 Minutes Intravenous Once 05/08/24 1316 05/08/24 1355   05/08/24 1000  cefTRIAXone  (ROCEPHIN ) 2 g in sodium chloride  0.9 %  100 mL IVPB  Status:  Discontinued        2 g 200 mL/hr over 30 Minutes Intravenous Every 24 hours 05/07/24 1329 05/08/24 1600   05/08/24 1000  azithromycin  (ZITHROMAX ) 500 mg in sodium chloride  0.9 % 250 mL IVPB  Status:  Discontinued        500 mg 250 mL/hr over 60 Minutes Intravenous Every 24 hours 05/07/24 1329 05/10/24 0801   05/07/24 1215  ceFEPIme  (MAXIPIME ) 2 g in sodium chloride  0.9 % 100 mL IVPB        2 g 200 mL/hr over 30 Minutes Intravenous  Once 05/07/24 1200 05/07/24 1251   05/07/24 1215  metroNIDAZOLE  (FLAGYL ) IVPB 500 mg        500 mg 100 mL/hr over 60 Minutes Intravenous  Once 05/07/24 1200 05/07/24 1400   05/07/24 1215  vancomycin  (VANCOCIN ) IVPB 1000 mg/200 mL premix        1,000 mg 200 mL/hr over 60 Minutes Intravenous  Once 05/07/24 1200 05/07/24 1504       Data Reviewed: I have personally reviewed following labs and imaging studies CBC: Recent Labs  Lab 05/07/24 1144 05/08/24 0540 05/09/24 0535 05/11/24 0518  WBC 7.6 5.5 6.3 5.2  NEUTROABS 5.0  --   --   --   HGB 13.7 13.3 13.7 14.6  HCT 42.7 42.4 43.7 45.9  MCV 88.2 89.3 89.7 89.6  PLT 134* 148* 154 171   Basic Metabolic Panel: Recent Labs  Lab 05/09/24 0535 05/10/24 0321 05/11/24 0518 05/12/24 0549 05/13/24 0429  NA 140 140 140 137 135  K 3.8 3.5 4.2 3.7 3.4*  CL 104 105 104 104 104  CO2 26 25 22 23 24   GLUCOSE 230* 264* 156* 141* 140*  BUN 20 24* 21 19 16   CREATININE 1.93* 1.92* 1.43* 1.39* 1.43*  CALCIUM  8.4* 8.1* 8.5* 8.3* 8.3*  MG  --  2.5* 2.5* 2.1 2.0   GFR: Estimated Creatinine Clearance: 60.8 mL/min (A) (by C-G formula based on SCr of 1.43 mg/dL (H)). Liver Function Tests: Recent Labs  Lab 05/07/24 1144 05/08/24 0540  AST 16 12*  ALT 13 <5  ALKPHOS 85 68  BILITOT 1.3* 1.2  PROT 7.6 6.9  ALBUMIN 3.9 3.4*   CBG: Recent Labs  Lab 05/12/24 2330 05/13/24 0407 05/13/24 0830 05/13/24 1149 05/13/24 1640  GLUCAP 120* 134* 130* 144* 176*    Recent Results (from the  past 240 hours)  Blood culture (routine x 2)     Status: None   Collection Time: 05/07/24 11:43 AM   Specimen: BLOOD  Result Value Ref Range Status   Specimen Description BLOOD BLOOD LEFT HAND  Final   Special Requests   Final    BOTTLES DRAWN AEROBIC AND ANAEROBIC Blood Culture results may not be optimal due to an inadequate volume of blood received in culture bottles   Culture   Final    NO GROWTH 5 DAYS Performed at [facility], [address] [place], [place] [address]    Report Status 05/12/2024 FINAL  Final  Blood culture (routine x 2)     Status: None   Collection Time: 05/07/24 11:43 AM   Specimen: BLOOD  Result Value Ref Range Status   Specimen Description BLOOD BLOOD RIGHT HAND  Final   Special Requests   Final    BOTTLES DRAWN AEROBIC AND ANAEROBIC Blood Culture results may not be optimal due to an inadequate volume of blood received in culture bottles   Culture   Final    NO GROWTH 5 DAYS Performed at [facility], [address] [place], [place] [address]    Report Status 05/12/2024 FINAL  Final  Resp panel by RT-PCR (RSV, Flu A&B, Covid) Anterior Nasal Swab     Status: Abnormal   Collection Time: 05/07/24 11:44 AM   Specimen: Anterior Nasal Swab  Result Value Ref Range Status   SARS Coronavirus 2 by RT PCR POSITIVE (A) NEGATIVE Final    Comment: (NOTE) SARS-CoV-2 target nucleic acids are DETECTED.  The SARS-CoV-2 RNA is generally detectable in upper respiratory specimens during the acute phase of infection. Positive results are indicative of the presence of the identified virus, but do not rule out bacterial infection or co-infection with other pathogens not detected by the test.  Clinical correlation with patient history and other diagnostic information is necessary to determine patient infection status. The expected result is Negative.  Fact Sheet for Patients: BloggerCourse.com  Fact Sheet for Healthcare  Providers: SeriousBroker.it  This test is not yet approved or cleared by the [place]  FDA and  has been authorized for detection and/or diagnosis of SARS-CoV-2 by FDA under an Emergency Use Authorization (EUA).  This EUA will remain in effect (meaning this test can be used) for the duration of  the COVID-19 declaration under Section 564(b)(1) of the A ct, 21 U.S.C. section 360bbb-3(b)(1), unless the authorization is terminated or revoked sooner.     Influenza A by PCR NEGATIVE NEGATIVE Final   Influenza B by PCR NEGATIVE NEGATIVE Final    Comment: (NOTE) The Xpert Xpress SARS-CoV-2/FLU/RSV plus assay is intended as an aid in the diagnosis of influenza from Nasopharyngeal swab specimens and should not be used as a sole basis for treatment. Nasal washings and aspirates are unacceptable for Xpert Xpress SARS-CoV-2/FLU/RSV testing.  Fact Sheet for Patients: BloggerCourse.com  Fact Sheet for Healthcare Providers: SeriousBroker.it  This test is not yet approved or cleared by the [place]  FDA and has been authorized for detection and/or diagnosis of SARS-CoV-2 by FDA under an Emergency Use Authorization (EUA). This EUA will remain in effect (meaning this test can be used) for the duration of the COVID-19 declaration under Section 564(b)(1) of the Act, 21 U.S.C. section 360bbb-3(b)(1), unless the authorization is terminated or revoked.     Resp Syncytial Virus by PCR NEGATIVE NEGATIVE Final    Comment: (NOTE) Fact Sheet for Patients: BloggerCourse.com  Fact Sheet for Healthcare Providers: SeriousBroker.it  This test is not yet approved or cleared by the [place]  FDA and has been authorized for detection and/or diagnosis of SARS-CoV-2 by FDA under an Emergency Use Authorization (EUA). This EUA will remain in effect (meaning this test can be  used) for the duration of the COVID-19 declaration under Section 564(b)(1) of the Act, 21 U.S.C. section 360bbb-3(b)(1), unless the authorization is terminated or revoked.  Performed at [facility], [address] [place], [place] [address]   MRSA Next Gen by PCR, Nasal     Status: None   Collection Time: 05/08/24  4:47 PM   Specimen: Nasal Swab  Result Value Ref Range Status   MRSA by PCR Next Gen NOT DETECTED NOT DETECTED Final    Comment: (NOTE) The GeneXpert MRSA Assay (FDA approved for NASAL specimens only), is one component of a comprehensive MRSA colonization surveillance program. It is not intended to diagnose MRSA infection nor to guide or monitor treatment for MRSA infections. Test performance is not FDA approved in patients less than 60 years old. Performed at [facility], [address] [place], [place] [address]      Radiology Studies: DG Abd 1 View Result Date: 05/12/2024 CLINICAL DATA:  [id number] Bowel obstruction [facility]) [phone] EXAM: ABDOMEN - 1 VIEW COMPARISON:  X-ray abdomen 04/08/2024 FINDINGS: Limited evaluation due to overlapping osseous structures and overlying soft tissues. Question gaseous distension of several loops of small bowel within the right abdomen with no definite dilatation. No radio-opaque calculi or other significant radiographic abnormality are seen. IMPRESSION: Question gaseous distension of several loops of small bowel within the right abdomen with no definite dilatation. Limited evaluation due to overlapping osseous structures and overlying soft tissues. Electronically Signed   By: Morgane  Naveau M.D.   On: 05/12/2024 11:14    Scheduled Meds:  amoxicillin -clavulanate  1 tablet Oral Q12H   atorvastatin   20 mg Oral Daily   carbidopa -levodopa   3 tablet Oral TID   enoxaparin  (LOVENOX ) injection  0.5 mg/kg Subcutaneous Q24H   insulin  aspart  0-9 Units Subcutaneous Q4H   insulin  glargine  12 Units Subcutaneous QHS   latanoprost    1 drop Both Eyes QHS   levETIRAcetam   1,000 mg Oral BID   tamsulosin   0.8 mg Oral QPC supper   Continuous Infusions:  lactated ringers  75 mL/hr at 05/13/24 0107     LOS: 6 days  MDM: Patient is high risk for one or more organ failure.  They necessitate ongoing hospitalization for continued IV therapies and subsequent lab monitoring. Total time spent interpreting labs and vitals, reviewing the medical record, coordinating care amongst consultants and care team members, directly assessing and discussing care with the patient and/or family: 55 min  Laree Lock, MD [facility]  To contact the attending physician between 7A-7P please use Epic Chat. To contact the covering physician during after hours 7P-7A, please review Amion.  05/13/2024, 5:38 PM   *This document has been created with the assistance of dictation software. Please excuse typographical errors. *

## 2024-05-14 DIAGNOSIS — U071 COVID-19: Secondary | ICD-10-CM | POA: Diagnosis not present

## 2024-05-14 DIAGNOSIS — J1282 Pneumonia due to coronavirus disease 2019: Secondary | ICD-10-CM | POA: Diagnosis not present

## 2024-05-14 LAB — GLUCOSE, CAPILLARY
Glucose-Capillary: 126 mg/dL — ABNORMAL HIGH (ref 70–99)
Glucose-Capillary: 141 mg/dL — ABNORMAL HIGH (ref 70–99)
Glucose-Capillary: 145 mg/dL — ABNORMAL HIGH (ref 70–99)
Glucose-Capillary: 210 mg/dL — ABNORMAL HIGH (ref 70–99)
Glucose-Capillary: 318 mg/dL — ABNORMAL HIGH (ref 70–99)
Glucose-Capillary: 335 mg/dL — ABNORMAL HIGH (ref 70–99)

## 2024-05-14 LAB — BASIC METABOLIC PANEL WITH GFR
Anion gap: 8 (ref 5–15)
BUN: 12 mg/dL (ref 8–23)
CO2: 24 mmol/L (ref 22–32)
Calcium: 8.5 mg/dL — ABNORMAL LOW (ref 8.9–10.3)
Chloride: 104 mmol/L (ref 98–111)
Creatinine, Ser: 1.1 mg/dL (ref 0.61–1.24)
GFR, Estimated: >60 mL/min (ref >60)
Glucose, Bld: 157 mg/dL — ABNORMAL HIGH (ref 70–99)
Potassium: 3.6 mmol/L (ref 3.5–5.1)
Sodium: 136 mmol/L (ref 135–145)

## 2024-05-14 LAB — MAGNESIUM: Magnesium: 1.8 mg/dL (ref 1.7–2.4)

## 2024-05-14 MED ORDER — INSULIN GLARGINE 100 UNIT/ML ~~LOC~~ SOLN
14.0000 [IU] | Freq: Every day | SUBCUTANEOUS | Status: DC
  Administered 2024-05-14 – 2024-05-15 (×2): 14 [IU] via SUBCUTANEOUS
  Filled 2024-05-14 (×3): qty 0.14

## 2024-05-14 NOTE — Progress Notes (Signed)
 PROGRESS NOTE    Kirk Bennett.  FMW:[id number] DOB: 1950-12-22 DOA: 05/07/2024 PCP: Rilla Baller, MD  Chief Complaint  Patient presents with   Altered Mental Status    Hospital Course:  Kirk Bennett. is a pleasant 73 y.o. male with medical history significant for Parkinson's disease, homebound status, HTN, HLD, diabetes, history of seizure, BPH OSA who presented to ED with altered mental status.  Patient is minimally verbal and unable to provide any history.  Brought in due to decreased responsiveness than baseline  Patient was admitted for COVID infection and pneumonia, hospital course as below  Subjective: Was examined at the bedside, very responsive, answering simple questions in one-word Patient denies nausea/vomiting, passing gas, BS+. Abd soft compared to yday Advance to regular diet Son Herman at the bedside, provided updates and answered all questions  Objective: Vitals:   05/14/24 0257 05/14/24 0832 05/14/24 1236 05/14/24 1620  BP: (!) 141/76 (!) 142/88 135/79 128/70  Pulse: 86 81 82 93  Resp: 19 18 18  (!) 21  Temp: 98.3 F (36.8 C) 98.1 F (36.7 C) 98.5 F (36.9 C) 98.5 F (36.9 C)  TempSrc:    Oral  SpO2: 99% 97% 99% 99%  Weight:      Height:       No intake or output data in the 24 hours ending 05/14/24 1910  Filed Weights   05/07/24 1206  Weight: 110.3 kg    Examination: Constitutional: Alert, awake, calm, comfortable, nonverbal HEENT: Neck supple Respiratory:  Clear to auscultation B/L, no wheezing, no rales.  Cardiovascular: Regular rate and rhythm, no murmurs / rubs / gallops. No extremity edema. 2+ pedal pulses. No carotid bruits.  Abdomen: soft, no tenderness, Bowel sounds positive.  Musculoskeletal: no clubbing / cyanosis. Good ROM, no contractures. Normal muscle tone.  Skin: no rashes, lesions, ulcers. Neurologic: Nonverbal moving all extremities equally and follows very minor commands Psychiatric: Alert and awake  Assessment &  Plan:  Principal Problem:   Pneumonia due to COVID-19 virus Active Problems:   Type 2 diabetes mellitus with diabetic nephropathy (HCC)   Essential hypertension, benign   OSA (obstructive sleep apnea)   BPH (benign prostatic hyperplasia)   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HCC)   Pneumonia  COVID-19 infection Sepsis likely due to superimposed bacterial pneumonia - Not requiring supplemental oxygen, spiking fevers, tachypneic and tachycardic - resolved - Does not meet the criteria for remdesivir  or dexamethasone (discussed with pharmacy) - Started on IV vancomycin  which was discontinued.  S/p IV Zosyn , azithromycin  changed to p.o. Augmentin  to complete 7 days - Seen by SLP, regular thin liquids  AKI on CKD stage 3a - resolved - Cr ~ 1.92 -> 1.43, and appears to be around 1.5-1.6 - s/p gentle IV fluids, encourage p.o. intake - Monitor Cr  Ileus - resolved - XR gaseous dilation of bowel loops, no evidence of obstruction.  Could reflect ileus - denies nausea/ vomiting, passing gas, has BS+ - Abd soft, compared to yday, Tolerating regular diet - Monitor and replete electrolytes  Hypokalemia - Mag normal - Monitor and replete as needed  Parkinson's disease - Continue his home medications carbidopa  levodopa  - Continue supportive care   Diabetes type 2 - He uses metformin , Trulicity , glimepiride  and Jardiance at home - Will discontinue all those oral hypoglycemic medications and injections. - On Lantus  14u, SSI - Continue to monitor blood sugars   History of seizure disorder - Continue Keppra    HTN/HLD - Resume Lisinopril  10mg    BPH -  Continue Flomax   -- PT/OT rec SNF  DVT prophylaxis: Lovenox  SQ   Code Status: Full Code Disposition: SNF  Consultants:  None  Procedures:  None  Antimicrobials:  Anti-infectives (From admission, onward)    Start     Dose/Rate Route Frequency Ordered Stop   05/12/24 1000  amoxicillin -clavulanate (AUGMENTIN ) 875-125 MG per tablet 1 tablet         1 tablet Oral Every 12 hours 05/11/24 1021 05/13/24 2102   05/10/24 1000  azithromycin  (ZITHROMAX ) tablet 500 mg  Status:  Discontinued        500 mg Oral Daily 05/10/24 0804 05/10/24 0812   05/10/24 1000  azithromycin  (ZITHROMAX ) 500 mg in sodium chloride  0.9 % 250 mL IVPB        500 mg 250 mL/hr over 60 Minutes Intravenous Every 24 hours 05/10/24 0812 05/11/24 1028   05/09/24 1700  vancomycin  (VANCOREADY) IVPB 1500 mg/300 mL  Status:  Discontinued        1,500 mg 150 mL/hr over 120 Minutes Intravenous Every 24 hours 05/08/24 1617 05/09/24 1408   05/09/24 1000  remdesivir  100 mg in sodium chloride  0.9 % 100 mL IVPB  Status:  Discontinued       Placed in Followed by Linked Group   100 mg 200 mL/hr over 30 Minutes Intravenous Daily 05/08/24 1316 05/08/24 1355   05/09/24 0200  piperacillin -tazobactam (ZOSYN ) IVPB 3.375 g        3.375 g 12.5 mL/hr over 240 Minutes Intravenous Every 8 hours 05/08/24 2102 05/11/24 2359   05/08/24 1730  vancomycin  (VANCOCIN ) IVPB 1000 mg/200 mL premix       Placed in And Linked Group   1,000 mg 200 mL/hr over 60 Minutes Intravenous  Once 05/08/24 1617 05/08/24 1835   05/08/24 1730  vancomycin  (VANCOREADY) IVPB 1250 mg/250 mL       Placed in And Linked Group   1,250 mg 166.7 mL/hr over 90 Minutes Intravenous  Once 05/08/24 1617 05/08/24 2023   05/08/24 1700  piperacillin -tazobactam (ZOSYN ) IVPB 3.375 g  Status:  Discontinued        3.375 g 12.5 mL/hr over 240 Minutes Intravenous Every 8 hours 05/08/24 1600 05/08/24 2103   05/08/24 1600  remdesivir  200 mg in sodium chloride  0.9% 250 mL IVPB  Status:  Discontinued       Placed in Followed by Linked Group   200 mg 580 mL/hr over 30 Minutes Intravenous Once 05/08/24 1316 05/08/24 1355   05/08/24 1000  cefTRIAXone  (ROCEPHIN ) 2 g in sodium chloride  0.9 % 100 mL IVPB  Status:  Discontinued        2 g 200 mL/hr over 30 Minutes Intravenous Every 24 hours 05/07/24 1329 05/08/24 1600   05/08/24 1000   azithromycin  (ZITHROMAX ) 500 mg in sodium chloride  0.9 % 250 mL IVPB  Status:  Discontinued        500 mg 250 mL/hr over 60 Minutes Intravenous Every 24 hours 05/07/24 1329 05/10/24 0801   05/07/24 1215  ceFEPIme  (MAXIPIME ) 2 g in sodium chloride  0.9 % 100 mL IVPB        2 g 200 mL/hr over 30 Minutes Intravenous  Once 05/07/24 1200 05/07/24 1251   05/07/24 1215  metroNIDAZOLE  (FLAGYL ) IVPB 500 mg        500 mg 100 mL/hr over 60 Minutes Intravenous  Once 05/07/24 1200 05/07/24 1400   05/07/24 1215  vancomycin  (VANCOCIN ) IVPB 1000 mg/200 mL premix        1,000 mg 200  mL/hr over 60 Minutes Intravenous  Once 05/07/24 1200 05/07/24 1504       Data Reviewed: I have personally reviewed following labs and imaging studies CBC: Recent Labs  Lab 05/08/24 0540 05/09/24 0535 05/11/24 0518  WBC 5.5 6.3 5.2  HGB 13.3 13.7 14.6  HCT 42.4 43.7 45.9  MCV 89.3 89.7 89.6  PLT 148* 154 171   Basic Metabolic Panel: Recent Labs  Lab 05/10/24 0321 05/11/24 0518 05/12/24 0549 05/13/24 0429 05/14/24 0532  NA 140 140 137 135 136  K 3.5 4.2 3.7 3.4* 3.6  CL 105 104 104 104 104  CO2 25 22 23 24 24   GLUCOSE 264* 156* 141* 140* 157*  BUN 24* 21 19 16 12   CREATININE 1.92* 1.43* 1.39* 1.43* 1.10  CALCIUM  8.1* 8.5* 8.3* 8.3* 8.5*  MG 2.5* 2.5* 2.1 2.0 1.8   GFR: Estimated Creatinine Clearance: 79 mL/min (by C-G formula based on SCr of 1.1 mg/dL). Liver Function Tests: Recent Labs  Lab 05/08/24 0540  AST 12*  ALT <5  ALKPHOS 68  BILITOT 1.2  PROT 6.9  ALBUMIN 3.4*   CBG: Recent Labs  Lab 05/14/24 0033 05/14/24 0355 05/14/24 0832 05/14/24 1228 05/14/24 1613  GLUCAP 126* 145* 141* 210* 335*    Recent Results (from the past 240 hours)  Blood culture (routine x 2)     Status: None   Collection Time: 05/07/24 11:43 AM   Specimen: BLOOD  Result Value Ref Range Status   Specimen Description BLOOD BLOOD LEFT HAND  Final   Special Requests   Final    BOTTLES DRAWN AEROBIC AND  ANAEROBIC Blood Culture results may not be optimal due to an inadequate volume of blood received in culture bottles   Culture   Final    NO GROWTH 5 DAYS Performed at [facility], [address] [place], [place] [address]    Report Status 05/12/2024 FINAL  Final  Blood culture (routine x 2)     Status: None   Collection Time: 05/07/24 11:43 AM   Specimen: BLOOD  Result Value Ref Range Status   Specimen Description BLOOD BLOOD RIGHT HAND  Final   Special Requests   Final    BOTTLES DRAWN AEROBIC AND ANAEROBIC Blood Culture results may not be optimal due to an inadequate volume of blood received in culture bottles   Culture   Final    NO GROWTH 5 DAYS Performed at [facility], [address] [place], [place] [address]    Report Status 05/12/2024 FINAL  Final  Resp panel by RT-PCR (RSV, Flu A&B, Covid) Anterior Nasal Swab     Status: Abnormal   Collection Time: 05/07/24 11:44 AM   Specimen: Anterior Nasal Swab  Result Value Ref Range Status   SARS Coronavirus 2 by RT PCR POSITIVE (A) NEGATIVE Final    Comment: (NOTE) SARS-CoV-2 target nucleic acids are DETECTED.  The SARS-CoV-2 RNA is generally detectable in upper respiratory specimens during the acute phase of infection. Positive results are indicative of the presence of the identified virus, but do not rule out bacterial infection or co-infection with other pathogens not detected by the test. Clinical correlation with patient history and other diagnostic information is necessary to determine patient infection status. The expected result is Negative.  Fact Sheet for Patients: BloggerCourse.com  Fact Sheet for Healthcare Providers: SeriousBroker.it  This test is not yet approved or cleared by the [place]  FDA and  has been authorized for detection and/or diagnosis of  SARS-CoV-2 by FDA under an Emergency Use Authorization (EUA).  This EUA will remain  in effect (meaning this test can be used) for the duration of  the COVID-19 declaration under Section 564(b)(1) of the A ct, 21 U.S.C. section 360bbb-3(b)(1), unless the authorization is terminated or revoked sooner.     Influenza A by PCR NEGATIVE NEGATIVE Final   Influenza B by PCR NEGATIVE NEGATIVE Final    Comment: (NOTE) The Xpert Xpress SARS-CoV-2/FLU/RSV plus assay is intended as an aid in the diagnosis of influenza from Nasopharyngeal swab specimens and should not be used as a sole basis for treatment. Nasal washings and aspirates are unacceptable for Xpert Xpress SARS-CoV-2/FLU/RSV testing.  Fact Sheet for Patients: BloggerCourse.com  Fact Sheet for Healthcare Providers: SeriousBroker.it  This test is not yet approved or cleared by the [place]  FDA and has been authorized for detection and/or diagnosis of SARS-CoV-2 by FDA under an Emergency Use Authorization (EUA). This EUA will remain in effect (meaning this test can be used) for the duration of the COVID-19 declaration under Section 564(b)(1) of the Act, 21 U.S.C. section 360bbb-3(b)(1), unless the authorization is terminated or revoked.     Resp Syncytial Virus by PCR NEGATIVE NEGATIVE Final    Comment: (NOTE) Fact Sheet for Patients: BloggerCourse.com  Fact Sheet for Healthcare Providers: SeriousBroker.it  This test is not yet approved or cleared by the [place]  FDA and has been authorized for detection and/or diagnosis of SARS-CoV-2 by FDA under an Emergency Use Authorization (EUA). This EUA will remain in effect (meaning this test can be used) for the duration of the COVID-19 declaration under Section 564(b)(1) of the Act, 21 U.S.C. section 360bbb-3(b)(1), unless the authorization is terminated or revoked.  Performed at [facility], [address] [place], [place] [address]   MRSA  Next Gen by PCR, Nasal     Status: None   Collection Time: 05/08/24  4:47 PM   Specimen: Nasal Swab  Result Value Ref Range Status   MRSA by PCR Next Gen NOT DETECTED NOT DETECTED Final    Comment: (NOTE) The GeneXpert MRSA Assay (FDA approved for NASAL specimens only), is one component of a comprehensive MRSA colonization surveillance program. It is not intended to diagnose MRSA infection nor to guide or monitor treatment for MRSA infections. Test performance is not FDA approved in patients less than 6 years old. Performed at [facility], [address], [place], [place] [address]      Radiology Studies: No results found.   Scheduled Meds:  atorvastatin   20 mg Oral Daily   carbidopa -levodopa   3 tablet Oral TID   enoxaparin  (LOVENOX ) injection  0.5 mg/kg Subcutaneous Q24H   insulin  aspart  0-9 Units Subcutaneous Q4H   insulin  glargine  12 Units Subcutaneous QHS   latanoprost   1 drop Both Eyes QHS   levETIRAcetam   1,000 mg Oral BID   lisinopril   10 mg Oral Daily   tamsulosin   0.8 mg Oral QPC supper   Continuous Infusions:  lactated ringers  40 mL/hr at 05/13/24 1904     LOS: 7 days  MDM: Patient is high risk for one or more organ failure.  They necessitate ongoing hospitalization for continued IV therapies and subsequent lab monitoring. Total time spent interpreting labs and vitals, reviewing the medical record, coordinating care amongst consultants and care team members, directly assessing and discussing care with the patient and/or family: 55 min  Laree Lock, MD [facility]  To contact the attending physician between  7A-7P please use Epic Chat. To contact the covering physician during after hours 7P-7A, please review Amion.  05/14/2024, 7:10 PM   *This document has been created with the assistance of dictation software. Please excuse typographical errors. *

## 2024-05-14 NOTE — TOC Progression Note (Signed)
 Transition of Care (TOC) - Progression Note    Patient Details  Name: Kirk Bennett. MRN: [id number] Date of Birth: 22-Nov-1950  Transition of [facility] CM/SW Contact  [name], LCSW Phone Number: 05/14/2024, 3:10 PM  Clinical Narrative:  [name]. Liaison stated that son has toured the facility and they have a private room for him. Left voicemail for wife. No response from [employer] or [employer] yet. CSW updated son, [name]. He said patient's wife will be flying back home tomorrow.  Expected Discharge Plan and Services                                               Social Drivers of Health (SDOH) Interventions SDOH Screenings   Food Insecurity: No Food Insecurity (05/07/2024)  Housing: Low Risk  (05/07/2024)  Transportation Needs: No Transportation Needs (05/07/2024)  Utilities: Not At Risk (05/07/2024)  Depression (PHQ2-9): Low Risk  (11/27/2023)  Social Connections: Socially Integrated (05/10/2024)  Tobacco Use: Low Risk  (02/22/2024)   Received from [facility]    Readmission Risk Interventions     No data to display

## 2024-05-14 NOTE — Plan of Care (Signed)

## 2024-05-14 NOTE — Progress Notes (Signed)
 Occupational Therapy Treatment Patient Details Name: Kirk Bennett. MRN: [id number] DOB: August 27, 1950 Today's Date: 05/14/2024   History of present illness Pt is a 73 year old male presented to ED with altered mental status, admitted with COVID-19 infection, suspect superimposed bacterial pneumonia, AKI. PMH: Parkinson's disease, homebound status, HTN, HLD, diabetes, history of seizure, BPH, and OSA.   OT comments  Pt is supine in bed on arrival. Easily arousable and agreeable to OT session with encouragement. He denies pain. Pt performed supine to sit at EOB with Mod A, cues for hand placement and initiation. He was noted to have soiled linens from cathter malfunction requiring cleanup, gown change and full linen change. 2nd assist called in. Pt tolerated sitting EOB x~25 mins with supervision. He was able to wash his face and feed himself with set up. Pt continues to have flat affect and slow responses. He required Max A for LB peri-care and UB dressing. He stood 3 bouts during session requiring Mod A x2 to Max A x1 to stand from significantly elevated bed height with cues for atnerior weight shift and hand/feet placement. Tolerated ~51min max standing tolerance for cleanup, linen change and dressing change. Attempted lateral side steps, only able to take very small shuffle steps towards [facility] with Mod A x2 for weight shift assist. Pt continues to fatigue easily with limited standing tolerance. Returned to supine with Mod A x2 and scooted to [facility]. Pt left with all needs in place and will cont to require skilled acute OT services to maximize his safety and IND to return to PLOF.       If plan is discharge home, recommend the following:  Two people to help with walking and/or transfers;Supervision due to cognitive status;A lot of help with bathing/dressing/bathroom   Equipment Recommendations  Other (comment) (defer to next venue)    Recommendations for Other Services      Precautions /  Restrictions Precautions Precautions: Fall Recall of Precautions/Restrictions: Impaired Restrictions Weight Bearing Restrictions Per Provider Order: No       Mobility Bed Mobility Overal bed mobility: Needs Assistance Bed Mobility: Supine to Sit, Sit to Supine     Supine to sit: HOB elevated, Mod assist, Used rails Sit to supine: Mod assist, +2 for physical assistance   General bed mobility comments: utilized log roll technique to maximize ease, Mod A for trunkal elevation, able to manage BLEs to EOB; Mod A x2 to return safely to supine, but able to scoot to [facility] with Mod A x2 with pt holding headboard and bed in trendelenburg    Transfers Overall transfer level: Needs assistance Equipment used: Rolling walker (2 wheels) Transfers: Sit to/from Stand Sit to Stand: +2 physical assistance, Mod assist, From elevated surface, Max assist           General transfer comment: 3 STS performed from highly elevated bed height with Mod A x2 to Max A x1 to change linens and foam dressing on buttocks after large urine incont episode in bed and small BM; pt required Mod A to maintain standing balance for ~45 seconds with forward flexed posture noted, unable to stand fully upright despite cues and only able to take very small shuffle steps with max cueing and weight shift assist     Balance Overall balance assessment: Needs assistance Sitting-balance support: Feet supported Sitting balance-Leahy Scale: Fair Sitting balance - Comments: no LOB seated EOB to wash face; sat up EOB x25 mins   Standing balance support: Bilateral upper extremity  supported, During functional activity, Reliant on assistive device for balance Standing balance-Leahy Scale: Poor Standing balance comment: Mod A x2 to stand at [facility] with forward flexed posture; Mod A to maintain standing balance                           ADL either performed or assessed with clinical judgement   ADL Overall ADL's : Needs  assistance/impaired Eating/Feeding: Set up;Bed level   Grooming: Wash/dry face;Set up           Upper Body Dressing : Maximal assistance;Sitting Upper Body Dressing Details (indicate cue type and reason): doff/donn gown seated EOB         Toileting- Clothing Manipulation and Hygiene: Maximal assistance;Total assistance;Bed level              Extremity/Trunk Assessment              Vision       Perception     Praxis     Communication Communication Communication: Impaired Factors Affecting Communication: Difficulty expressing self;Reduced clarity of speech   Cognition Arousal: Alert Behavior During Therapy: Flat affect                                 Following commands: Impaired Following commands impaired: Follows one step commands with increased time      Cueing   Cueing Techniques: Verbal cues, Tactile cues, Visual cues  Exercises      Shoulder Instructions       General Comments      Pertinent Vitals/ Pain       Pain Assessment Pain Assessment: No/denies pain Pain Intervention(s): Monitored during session  Home Living                                          Prior Functioning/Environment              Frequency  Min 2X/week        Progress Toward Goals  OT Goals(current goals can now be found in the care plan section)  Progress towards OT goals: Progressing toward goals  Acute Rehab OT Goals Patient Stated Goal: get stronger OT Goal Formulation: With patient Time For Goal Achievement: 05/24/24 Potential to Achieve Goals: Good  Plan      Co-evaluation                 AM-PAC OT 6 Clicks Daily Activity     Outcome Measure   Help from another person eating meals?: None Help from another person taking care of personal grooming?: A Little Help from another person toileting, which includes using toliet, bedpan, or urinal?: A Lot Help from another person bathing (including washing,  rinsing, drying)?: A Lot Help from another person to put on and taking off regular upper body clothing?: A Lot Help from another person to put on and taking off regular lower body clothing?: A Lot 6 Click Score: 15    End of Session Equipment Utilized During Treatment: Rolling walker (2 wheels);Gait belt  OT Visit Diagnosis: Other abnormalities of gait and mobility (R26.89);Muscle weakness (generalized) (M62.81)   Activity Tolerance Patient tolerated treatment well   Patient Left in bed;with call [place]/phone within reach;with bed alarm set   Nurse Communication Mobility status  Time: 9080-9041 OT Time Calculation (min): 39 min  Charges: OT General Charges $OT Visit: 1 Visit OT Treatments $Self Care/Home Management : 23-37 mins $Therapeutic Activity: 8-22 mins  Kirk Bennett, OTR/L  05/14/24, 10:50 AM   Kirk Bennett 05/14/2024, 10:46 AM

## 2024-05-14 NOTE — Progress Notes (Signed)
 Physical Therapy Treatment Patient Details Name: Kirk Bennett. MRN: [id number] DOB: 05-Dec-1950 Today's Date: 05/14/2024   History of Present Illness Pt is a 73 year old male presented to ED with altered mental status, admitted with COVID-19 infection, suspect superimposed bacterial pneumonia, AKI. PMH: Parkinson's disease, homebound status, HTN, HLD, diabetes, history of seizure, BPH, and OSA.    PT Comments  Pt was long sitting in bed. He is alert but present with flat affect. Agreeable to OOB attempts + PT session. He required extensive assistance to safely achieve EOB sitting however once seated EOB was able to maintain balance with CGA-supervision. Pt stood 1 x from very elevated bed height > RW with max assist. Unable to straighten knees. Author highly recommend +2 assistance for any/all future OOB attempts. Upon standing EOB, condom cath fell off and pt also has incontinent of bowls. Elected to return to bed and have RN staff assist with hygiene care/replace cath. Overall pt tolerates session well but remains far from baseline abilities. DC rec remain appropriate to maximize independence and safety with all ADLs.    If plan is discharge home, recommend the following: Two people to help with walking and/or transfers;Two people to help with bathing/dressing/bathroom;Assistance with cooking/housework;Help with stairs or ramp for entrance;Assist for transportation     Equipment Recommendations  Other (comment) (Defer to next level of care)       Precautions / Restrictions Precautions Precautions: Fall Recall of Precautions/Restrictions: Impaired Restrictions Weight Bearing Restrictions Per Provider Order: No     Mobility  Bed Mobility Overal bed mobility: Needs Assistance Bed Mobility: Supine to Sit, Sit to Supine  Supine to sit: Max assist, Total assist, Used rails Sit to supine: Max assist, Total assist General bed mobility comments: pt required max-total assist of one to  achieve EOB sitting and then later to return to bed. Once seated EOB, he was able to maintain sitting balance with  CGA-supervision    Transfers Overall transfer level: Needs assistance Equipment used: Rolling walker (2 wheels) Transfers: Sit to/from Stand Sit to Stand: Max assist, From elevated surface  General transfer comment: Pt stood from elevated bed height 1 x with Max assist of one. Highly recommend +2 assistance for all future OOB activity       Balance Overall balance assessment: Needs assistance Sitting-balance support: Feet supported Sitting balance-Leahy Scale: Fair Sitting balance - Comments: CGA-supervision while seated EOB   Standing balance support: Bilateral upper extremity supported, During functional activity, Reliant on assistive device for balance Standing balance-Leahy Scale: Poor Standing balance comment: Pt at extremely high fall risk      Communication Communication Communication: Impaired Factors Affecting Communication: Difficulty expressing self;Reduced clarity of speech  Cognition Arousal: Alert Behavior During Therapy: Flat affect   PT - Cognitive impairments: No family/caregiver present to determine baseline   Orientation impairments: Time, Situation, Place   PT - Cognition Comments: Pt does not volunteer much but does respond and answer questions. Pt clearly has poor insight of deficits and his abilities Following commands: Impaired Following commands impaired: Follows one step commands with increased time    Cueing Cueing Techniques: Verbal cues, Tactile cues, Visual cues     General Comments General comments (skin integrity, edema, etc.): Pt's condom cath fell off with standing. Also had BM that he was unaware of. RN/RN tech assist with bed linen + hygiene care after incontinence episode      Pertinent Vitals/Pain Pain Assessment Pain Assessment: No/denies pain     PT Goals (  current goals can now be found in the care plan section)  Acute Rehab PT Goals Patient Stated Goal: go home Progress towards PT goals: Progressing toward goals    Frequency    Min 2X/week       AM-PAC PT 6 Clicks Mobility   Outcome Measure  Help needed turning from your back to your side while in a flat bed without using bedrails?: A Lot Help needed moving from lying on your back to sitting on the side of a flat bed without using bedrails?: A Lot Help needed moving to and from a bed to a chair (including a wheelchair)?: A Lot Help needed standing up from a chair using your arms (e.g., wheelchair or bedside chair)?: A Lot Help needed to walk in hospital room?: Total Help needed climbing 3-5 steps with a railing? : Total 6 Click Score: 10    End of Session Equipment Utilized During Treatment: Gait belt Activity Tolerance: Other (comment) (limited by incontinence and weakness) Patient left: in bed;with call Dettmer/phone within reach;with bed alarm set;with SCD's reapplied;with nursing/sitter in room Nurse Communication: Mobility status PT Visit Diagnosis: Unsteadiness on feet (R26.81);Muscle weakness (generalized) (M62.81)     Time: 1351-1420 PT Time Calculation (min) (ACUTE ONLY): 29 min  Charges:    $Therapeutic Activity: 23-37 mins PT General Charges $$ ACUTE PT VISIT: 1 Visit                     Rankin Essex PTA 05/14/24, 4:28 PM

## 2024-05-15 ENCOUNTER — Encounter: Payer: Self-pay | Admitting: Hospitalist

## 2024-05-15 ENCOUNTER — Other Ambulatory Visit: Payer: Self-pay

## 2024-05-15 DIAGNOSIS — U071 COVID-19: Secondary | ICD-10-CM | POA: Diagnosis not present

## 2024-05-15 DIAGNOSIS — J1282 Pneumonia due to coronavirus disease 2019: Secondary | ICD-10-CM | POA: Diagnosis not present

## 2024-05-15 LAB — BASIC METABOLIC PANEL WITH GFR
Anion gap: 8 (ref 5–15)
BUN: 13 mg/dL (ref 8–23)
CO2: 26 mmol/L (ref 22–32)
Calcium: 8.6 mg/dL — ABNORMAL LOW (ref 8.9–10.3)
Chloride: 102 mmol/L (ref 98–111)
Creatinine, Ser: 1.18 mg/dL (ref 0.61–1.24)
GFR, Estimated: >60 mL/min (ref >60)
Glucose, Bld: 192 mg/dL — ABNORMAL HIGH (ref 70–99)
Potassium: 3.6 mmol/L (ref 3.5–5.1)
Sodium: 136 mmol/L (ref 135–145)

## 2024-05-15 LAB — GLUCOSE, CAPILLARY
Glucose-Capillary: 182 mg/dL — ABNORMAL HIGH (ref 70–99)
Glucose-Capillary: 188 mg/dL — ABNORMAL HIGH (ref 70–99)
Glucose-Capillary: 214 mg/dL — ABNORMAL HIGH (ref 70–99)
Glucose-Capillary: 239 mg/dL — ABNORMAL HIGH (ref 70–99)
Glucose-Capillary: 285 mg/dL — ABNORMAL HIGH (ref 70–99)
Glucose-Capillary: 329 mg/dL — ABNORMAL HIGH (ref 70–99)

## 2024-05-15 MED ORDER — DOXAZOSIN MESYLATE 2 MG PO TABS: 2.0000 mg | ORAL_TABLET | Freq: Every evening | ORAL | Status: DC

## 2024-05-15 MED ORDER — DOXAZOSIN MESYLATE 1 MG PO TABS
1.0000 mg | ORAL_TABLET | Freq: Every evening | ORAL | Status: DC
  Administered 2024-05-15 – 2024-05-16 (×2): 1 mg via ORAL
  Filled 2024-05-15 (×2): qty 1

## 2024-05-15 NOTE — Plan of Care (Signed)

## 2024-05-15 NOTE — TOC Progression Note (Addendum)
 Transition of Care (TOC) - Progression Note    Patient Details  Name: Kirk Bennett. MRN: [id number] Date of Birth: 13-Sep-1950  Transition of Care [facility]) CM/SW Contact  [name], LCSW Phone Number: 05/15/2024, 11:25 AM  Clinical Narrative:   Left wife a voicemail.  1:23 pm: Received call from [facility] admissions coordinator. Family is at the facility touring.  Expected Discharge Plan and Services                                               Social Drivers of Health (SDOH) Interventions SDOH Screenings   Food Insecurity: No Food Insecurity (05/07/2024)  Housing: Low Risk  (05/07/2024)  Transportation Needs: No Transportation Needs (05/07/2024)  Utilities: Not At Risk (05/07/2024)  Depression (PHQ2-9): Low Risk  (11/27/2023)  Social Connections: Socially Integrated (05/10/2024)  Tobacco Use: Low Risk  (02/22/2024)   Received from [facility]    Readmission Risk Interventions     No data to display

## 2024-05-15 NOTE — Inpatient Diabetes Management (Signed)
 Inpatient Diabetes Program Recommendations  AACE/ADA: New Consensus Statement on Inpatient Glycemic Control (2015)  Target Ranges:  Prepandial:   less than 140 mg/dL      Peak postprandial:   less than 180 mg/dL (1-2 hours)      Critically ill patients:  140 - 180 mg/dL     Latest Reference Range & Units 05/14/24 08:32 05/14/24 12:28 05/14/24 16:13 05/14/24 19:55 05/15/24 00:34 05/15/24 04:28 05/15/24 07:52  Glucose-Capillary 70 - 99 mg/dL 858 (H) 789 (H) 664 (H) 318 (H) 239 (H) 182 (H) 188 (H)   Home DM Meds:  Amaryl  4 mg daily  Jardiance 25 mg daily  Metformin  500 AM/ 1000 PM  Trulicity  0.75 mg Qweek  FSL3 CGM  Last A1c was 6.6% (June 2025 at the [facility] office in [place])    Current Orders:  Lantus  14 units at HS  Novolog  0-9 units Q4   Please also consider adding low dose Novolog  Meal Coverage: Novolog  3 units TID with meals HOLD if pt NPO HOLD if pt eats <50% meals   --Will follow patient during hospitalization--  Clotilda Bull RN, MSN, BC-ADM Inpatient Diabetes Coordinator Team Pager [phone] (8a-5p)

## 2024-05-15 NOTE — Progress Notes (Signed)
 There was a consult for placing PIV access. This patient doesn't have any IV meds except prn Zofran  which hasn't taken for more than 4 days. Patient's been in the hospital for 8 days. Informed Dr. [name] and patient's RN regarding this matter and recommended no PIV access at this time. Dr. [name] was okay with it. Not placing PIV access at this time. HS [employer]

## 2024-05-15 NOTE — Progress Notes (Signed)
 [facility] Progress Note  Patient: Kirk Bennett.    FMW:[id number]  DOA: 05/07/2024     Date of Service: the patient was seen and examined on 05/15/2024  Chief Complaint  Patient presents with   Altered Mental Status   Brief hospital course: Kirk Bennett. is a pleasant 73 y.o. male with medical history significant for Parkinson's disease, homebound status, HTN, HLD, diabetes, history of seizure, BPH OSA who presented to ED with altered mental status.  Patient is minimally verbal and unable to provide any history.  Brought in due to decreased responsiveness than baseline   Patient was admitted for COVID infection and pneumonia, hospital course as below   Assessment and Plan:  # COVID-19 infection # Sepsis likely due to superimposed bacterial pneumonia - Not requiring supplemental oxygen, spiking fevers, tachypneic and tachycardic - resolved - Does not meet the criteria for remdesivir  or dexamethasone (discussed with pharmacy) - Started on IV vancomycin  which was discontinued.  S/p IV Zosyn , azithromycin  changed to p.o. Augmentin  to complete 7 days - Seen by SLP, regular thin liquids    # AKI on CKD stage 3a - resolved - Cr ~ 1.92 -> 1.43, and appears to be around 1.5-1.6 - s/p gentle IV fluids, encourage p.o. intake - Monitor Cr   # Ileus - resolved - XR gaseous dilation of bowel loops, no evidence of obstruction.  Could reflect ileus - denies nausea/ vomiting, passing gas, has BS+ - Abd soft, compared to yday, Tolerating regular diet - Monitor and replete electrolytes   # Hypokalemia - Mag normal - Monitor and replete as needed   # Parkinson's disease - Continue his home medications carbidopa  levodopa  - Continue supportive care   # Diabetes type 2 - He uses metformin , Trulicity , glimepiride  and Jardiance at home - Will discontinue all those oral hypoglycemic medications and injections. - On Lantus  14u, SSI - Continue to monitor blood sugars   #  History of seizure disorder - Continue Keppra    # HTN/HLD - Resume Lisinopril  10mg    # BPH - Continue Flomax    -- PT/OT rec SNF  Body mass index is 32.98 kg/m.  Interventions:   Diet: Carb modified diet DVT Prophylaxis: Subcutaneous Lovenox    Advance goals of care discussion: Full code  Family Communication: family was not present at bedside, at the time of interview.   The pt provided permission to discuss medical plan with the family. Opportunity was given to ask question and all questions were answered satisfactorily.   Disposition:  Pt is from home, admitted with COVID PNA, clinically stable, medically optimized. Discharge to SNF, when bed will be available. TOC is following for dispo plan  Subjective: No significant events overnight, patient still has mild productive cough, no any other complaints.  Physical Exam: General: NAD, lying comfortably Appear in no distress, affect appropriate Eyes: PERRLA ENT: Oral Mucosa Clear, moist  Neck: no JVD,  Cardiovascular: S1 and S2 Present, no Murmur,  Respiratory: good respiratory effort, Bilateral Air entry equal and Decreased, no Crackles, no wheezes Abdomen: Bowel Sound present, Soft and no tenderness,  Skin: no rashes Extremities: no Pedal edema, no calf tenderness Neurologic: without any new focal findings Gait not checked due to patient safety concerns  Vitals:   05/15/24 0431 05/15/24 0754 05/15/24 1213 05/15/24 1540  BP: 135/68 137/78 (!) 142/72 (!) 148/71  Pulse: 89 85 87 81  Resp:  18 17 18   Temp: 98.2 F (36.8 C) 98.4 F (36.9 C) 98.4 F (  36.9 C) 97.6 F (36.4 C)  TempSrc: Oral  Oral Oral  SpO2: 96% 98% 98% 99%  Weight:      Height:        Intake/Output Summary (Last 24 hours) at 05/15/2024 1543 Last data filed at 05/15/2024 1400 Gross per 24 hour  Intake 237 ml  Output 1250 ml  Net -1013 ml   Filed Weights   05/07/24 1206  Weight: 110.3 kg    Data Reviewed: I have personally reviewed and  interpreted daily labs, tele strips, imagings as discussed above. I reviewed all nursing notes, pharmacy notes, vitals, pertinent old records I have discussed plan of care as described above with RN and patient/family.  CBC: Recent Labs  Lab 05/09/24 0535 05/11/24 0518  WBC 6.3 5.2  HGB 13.7 14.6  HCT 43.7 45.9  MCV 89.7 89.6  PLT 154 171   Basic Metabolic Panel: Recent Labs  Lab 05/10/24 0321 05/11/24 0518 05/12/24 0549 05/13/24 0429 05/14/24 0532 05/15/24 0428  NA 140 140 137 135 136 136  K 3.5 4.2 3.7 3.4* 3.6 3.6  CL 105 104 104 104 104 102  CO2 25 22 23 24 24 26   GLUCOSE 264* 156* 141* 140* 157* 192*  BUN 24* 21 19 16 12 13   CREATININE 1.92* 1.43* 1.39* 1.43* 1.10 1.18  CALCIUM  8.1* 8.5* 8.3* 8.3* 8.5* 8.6*  MG 2.5* 2.5* 2.1 2.0 1.8  --     Studies: No results found.  Scheduled Meds:  atorvastatin   20 mg Oral Daily   carbidopa -levodopa   3 tablet Oral TID   enoxaparin  (LOVENOX ) injection  0.5 mg/kg Subcutaneous Q24H   insulin  aspart  0-9 Units Subcutaneous Q4H   insulin  glargine  14 Units Subcutaneous QHS   latanoprost   1 drop Both Eyes QHS   levETIRAcetam   1,000 mg Oral BID   lisinopril   10 mg Oral Daily   tamsulosin   0.8 mg Oral QPC supper   Continuous Infusions: PRN Meds: acetaminophen , ondansetron  **OR** ondansetron  (ZOFRAN ) IV, polyethylene glycol  Time spent: 35 minutes  Author: ELVAN SOR. MD [facility] 05/15/2024 3:43 PM  To reach On-call, see care teams to locate the attending and reach out to them via [email or web address]. If 7PM-7AM, please contact night-coverage If you still have difficulty reaching the attending provider, please page the [facility] (Director on Call) for [facility] on amion for assistance.

## 2024-05-16 DIAGNOSIS — U071 COVID-19: Secondary | ICD-10-CM | POA: Diagnosis not present

## 2024-05-16 DIAGNOSIS — J1282 Pneumonia due to coronavirus disease 2019: Secondary | ICD-10-CM | POA: Diagnosis not present

## 2024-05-16 LAB — GLUCOSE, CAPILLARY
Glucose-Capillary: 145 mg/dL — ABNORMAL HIGH (ref 70–99)
Glucose-Capillary: 154 mg/dL — ABNORMAL HIGH (ref 70–99)
Glucose-Capillary: 169 mg/dL — ABNORMAL HIGH (ref 70–99)
Glucose-Capillary: 241 mg/dL — ABNORMAL HIGH (ref 70–99)
Glucose-Capillary: 252 mg/dL — ABNORMAL HIGH (ref 70–99)
Glucose-Capillary: 276 mg/dL — ABNORMAL HIGH (ref 70–99)

## 2024-05-16 NOTE — Plan of Care (Signed)

## 2024-05-16 NOTE — TOC Transition Note (Signed)
 Transition of Care [facility]) - Discharge Note   Patient Details  Name: Kirk Bennett. MRN: [id number] Date of Birth: 12-May-1951  [facility]) CM/SW Contact:  [name], LCSW Phone Number: 05/16/2024, 2:52 PM   Clinical Narrative:  Patient has orders to discharge to [facility] today. RN will call report to [phone] (Room 1201). [facility] has been arranged and he is 5th on the list. No further concerns. CSW signing off.   Final next level of care: Skilled Nursing Facility Barriers to Discharge: Barriers Resolved   Patient Goals and CMS Choice            Discharge Placement PASRR number recieved: 05/11/24            Patient chooses bed at: [facility] Patient to be transferred to facility by: [facility] Name of family member notified: Voris Tigert Patient and family notified of of transfer: 05/16/24  Discharge Plan and Services Additional resources added to the After Visit Summary for                                       Social Drivers of Health (SDOH) Interventions SDOH Screenings   Food Insecurity: No Food Insecurity (05/07/2024)  Housing: Low Risk  (05/07/2024)  Transportation Needs: No Transportation Needs (05/07/2024)  Utilities: Not At Risk (05/07/2024)  Depression (PHQ2-9): Low Risk  (11/27/2023)  Social Connections: Socially Integrated (05/10/2024)  Tobacco Use: Low Risk  (02/22/2024)   Received from [facility]     Readmission Risk Interventions     No data to display

## 2024-05-16 NOTE — TOC Progression Note (Addendum)
 Transition of Care (TOC) - Progression Note    Patient Details  Name: Kirk Bennett Carolee Mickey. MRN: [id number] Date of Birth: 06/18/1951  Transition of Care [facility]) CM/SW Contact  [name], LCSW Phone Number: 05/16/2024, 12:42 PM  Clinical Narrative:   CSW called wife. Reviewed bed offers. She has accepted [facility]. They have a bed available today so MD will start working on discharge paperwork. Wife is aware.  12:54 pm: Unable to reach patient's [facility] social worker to update by phone or email.  Expected Discharge Plan and Services                                               Social Drivers of Health (SDOH) Interventions SDOH Screenings   Food Insecurity: No Food Insecurity (05/07/2024)  Housing: Low Risk  (05/07/2024)  Transportation Needs: No Transportation Needs (05/07/2024)  Utilities: Not At Risk (05/07/2024)  Depression (PHQ2-9): Low Risk  (11/27/2023)  Social Connections: Socially Integrated (05/10/2024)  Tobacco Use: Low Risk  (02/22/2024)   Received from [facility]    Readmission Risk Interventions     No data to display

## 2024-05-16 NOTE — Discharge Summary (Signed)
 [facility] Discharge Summary   Patient: Kirk Bennett Carolee Mickey. FMW:[id number]  PCP: Rilla Baller, MD  Date of admission: 05/07/2024   Date of discharge:  05/16/2024     Discharge Diagnoses:  Principal Problem:   Pneumonia due to COVID-19 virus Active Problems:   Type 2 diabetes mellitus with diabetic nephropathy (HCC)   Essential hypertension, benign   OSA (obstructive sleep apnea)   BPH (benign prostatic hyperplasia)   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HCC)   Pneumonia   Admitted From: Home Disposition:  SNF   Recommendations for Outpatient Follow-up:  F/u with an MD, need to be seen by an MD in 1-2 days at SNF Follow up LABS/TEST:  Monitor CBG and titrate meds   Contact information for after-discharge care     Destination     [facility] .   Service: Skilled Nursing Contact information: [address] [place]  [phone]                    Diet recommendation: Cardiac and Carb modified diet  Activity: The patient is advised to gradually reintroduce usual activities, as tolerated  Discharge Condition: stable  Code Status: Full code   History of present illness: As per the H and P dictated on admission.  Hospital Course:  Kirk Bennett. is a pleasant 73 y.o. male with medical history significant for Parkinson's disease, homebound status, HTN, HLD, diabetes, history of seizure, BPH OSA who presented to ED with altered mental status.  Patient is minimally verbal and unable to provide any history.  Brought in due to decreased responsiveness than baseline   Patient was admitted for COVID infection and pneumonia, hospital course as below   Assessment and Plan:   # COVID-19 infection, positive on 9/16 # Sepsis likely due to superimposed bacterial pneumonia - Not requiring supplemental oxygen, spiking fevers, tachypneic and tachycardic - resolved - Does not meet the criteria for remdesivir  or  dexamethasone (discussed with pharmacy) - s/p IV vancomycin  which was discontinued.  S/p IV Zosyn , azithromycin  changed to p.o. Augmentin  to complete 7 days - Seen by SLP, regular thin liquids No more need of antibiotics.  Currently stable.    # AKI on CKD stage 3a - resolved - Cr ~ 1.92 -> 1.43, and appears to be around 1.5-1.6 - s/p gentle IV fluids, encourage p.o. intake - sCr 1.18 on 9/23, AKI resolved   # Ileus - resolved - XR gaseous dilation of bowel loops, no evidence of obstruction.  Could reflect ileus - denies nausea/ vomiting, passing gas, has BS+ - Abd soft, compared to yday, Tolerating regular diet   # Hypokalemia: Potassium repleted and resolved. # Parkinson's disease - Continue his home medications carbidopa  levodopa  - Continue supportive care   # Diabetes type 2: s/p Lantus  14 units and insulin  sliding scale during hospital stay.  Resumed home meds. - He uses metformin , Trulicity , glimepiride  and Jardiance at home - Will discontinue all those oral hypoglycemic medications and injections.  - Continue to monitor blood sugars   # History of seizure disorder: Continue Keppra  # HTN/HLD: Resume Lisinopril  10mg , Lipitor 20 mg # BPH: Continue Flomax    Body mass index is 32.98 kg/m.  Nutrition Interventions:   Patient was seen by physical therapy, who recommended Therapy, SNF placement, which was arranged. On the day of the discharge the patient's vitals were stable, and no other acute medical condition were reported by patient. the patient was felt safe to be  discharge at SNF with Therapy.  Consultants: None Procedures: None  Discharge Exam: General: Appear in no distress, Oral Mucosa Clear, moist. Cardiovascular: S1 and S2 Present, no Murmur, Respiratory: normal respiratory effort, Bilateral Air entry present and no Crackles, no wheezes Abdomen: Bowel Sound present, Soft and no tenderness. Extremities: no Pedal edema, no calf tenderness Neurology: alert and  oriented to time, place, and person affect appropriate.  Filed Weights   05/07/24 1206  Weight: 110.3 kg   Vitals:   05/16/24 0859 05/16/24 1249  BP: 132/72 (!) 124/59  Pulse: 86 90  Resp: 19 17  Temp: 98.4 F (36.9 C) 98 F (36.7 C)  SpO2: 98% 97%    DISCHARGE MEDICATION: Allergies as of 05/16/2024       Reactions   Bee Venom Shortness Of Breath   Demerol [meperidine Hcl] Nausea And Vomiting        Medication List     STOP taking these medications    clotrimazole  1 % cream Commonly known as: Lotrimin  AF       TAKE these medications    atorvastatin  20 MG tablet Commonly known as: LIPITOR TAKE 1 TABLET BY MOUTH EVERY DAY   Blood Glucose Monitoring Suppl Devi 1 each by Does not apply route in the morning, at noon, and at bedtime. May substitute to any manufacturer covered by patient's insurance.   carbidopa -levodopa  25-100 MG tablet Commonly known as: SINEMET  IR Take 3 tablets by mouth 3 (three) times daily.   cyanocobalamin  1000 MCG tablet Commonly known as: VITAMIN B12 Take 1 tablet (1,000 mcg total) by mouth once a week.   empagliflozin 25 MG Tabs tablet Commonly known as: Jardiance Take 1 tablet (25 mg total) by mouth daily.   FreeStyle Libre 3 Sensor Misc PLACE 1 SENSOR ON THE SKIN EVERY 14 DAYS. USE TO CHECK GLUCOSE CONTINUOUSLY   glimepiride  4 MG tablet Commonly known as: AMARYL  Take 1 tablet (4 mg total) by mouth daily with breakfast.   glucose blood test strip Commonly known as: ONE TOUCH ULTRA TEST Use to check sugar fasting in the AM and 2 hours after lunch or supper. Dx: E11.21   levETIRAcetam  1000 MG tablet Commonly known as: KEPPRA  Take 1,000 mg by mouth 2 (two) times daily.   lisinopril  10 MG tablet Commonly known as: ZESTRIL  Take 1 tablet (10 mg total) by mouth 2 (two) times daily.   metFORMIN  500 MG tablet Commonly known as: GLUCOPHAGE  Take 1 tablet (500 mg total) by mouth daily with breakfast AND 2 tablets (1,000 mg  total) daily with supper.   Multivitamin Adult Tabs Take 1 tablet by mouth daily.   tamsulosin  0.4 MG Caps capsule Commonly known as: FLOMAX  Take 2 capsules (0.8 mg total) by mouth daily after supper.   Trulicity  0.75 MG/0.5ML Soaj Generic drug: Dulaglutide  INJECT 0.75 MG SUBCUTANEOUSLY ONE TIME PER WEEK   Vitamin D3 25 MCG (1000 UT) Caps Take 1,000 Units by mouth daily.   Xalatan  0.005 % ophthalmic solution Generic drug: latanoprost  Place 1 drop into both eyes at bedtime.       Allergies  Allergen Reactions   Bee Venom Shortness Of Breath   Demerol [Meperidine Hcl] Nausea And Vomiting   Discharge Instructions     Call MD for:  difficulty breathing, headache or visual disturbances   Complete by: As directed    Call MD for:  extreme fatigue   Complete by: As directed    Call MD for:  persistant dizziness or light-headedness  Complete by: As directed    Call MD for:  persistant nausea and vomiting   Complete by: As directed    Call MD for:  severe uncontrolled pain   Complete by: As directed    Call MD for:  temperature >100.4   Complete by: As directed    Diet - low sodium heart healthy   Complete by: As directed    Diet Carb Modified   Complete by: As directed    Discharge instructions   Complete by: As directed    F/u with an MD, need to be seen by an MD in 1-2 days at [facility] Monitor CBG and titrate meds   Increase activity slowly   Complete by: As directed        The results of significant diagnostics from this hospitalization (including imaging, microbiology, ancillary and laboratory) are listed below for reference.    Significant Diagnostic Studies: DG Abd 1 View Result Date: 05/12/2024 CLINICAL DATA:  01238 Bowel obstruction (HCC) 01238 EXAM: ABDOMEN - 1 VIEW COMPARISON:  X-ray abdomen 04/08/2024 FINDINGS: Limited evaluation due to overlapping osseous structures and overlying soft tissues. Question gaseous distension of several loops of small bowel  within the right abdomen with no definite dilatation. No radio-opaque calculi or other significant radiographic abnormality are seen. IMPRESSION: Question gaseous distension of several loops of small bowel within the right abdomen with no definite dilatation. Limited evaluation due to overlapping osseous structures and overlying soft tissues. Electronically Signed   By: Morgane  Naveau M.D.   On: 05/12/2024 11:14   DG Abd 1 View Result Date: 05/09/2024 EXAM: 1 VIEW XRAY OF THE ABDOMEN 05/09/2024 07:46:00 PM COMPARISON: None available. CLINICAL HISTORY: Bowel obstruction FINDINGS: BOWEL: Mild diffuse gaseous dilation of bowel loops. SOFT TISSUES: No opaque urinary calculi. BONES: No acute osseous abnormality. IMPRESSION: 1. Mild diffuse gaseous dilation of bowel loops, without evidence of bowel obstruction. Findings could reflect ileus. Recommend correlation with serial abdominal exams. Electronically signed by: Donnice Mania MD 05/09/2024 08:48 PM EDT RP Workstation: [id number]   DG Chest 1 View Result Date: 05/08/2024 CLINICAL DATA:  Shortness of breath. EXAM: CHEST  1 VIEW COMPARISON:  Chest radiograph dated 05/07/2024 FINDINGS: Mild central vascular congestion. No focal consolidation, pleural effusion or pneumothorax. Stable cardiac silhouette. No acute osseous pathology. IMPRESSION: Mild central vascular congestion. No focal consolidation. Electronically Signed   By: Vanetta Chou M.D.   On: 05/08/2024 14:19   DG Chest Portable 1 View Result Date: 05/07/2024 CLINICAL DATA:  infection EXAM: PORTABLE CHEST - 1 VIEW COMPARISON:  10/13/2019 FINDINGS: Low lung volumes. Bilateral perihilar interstitial opacities. Patchy opacities in the right lung base. No pleural effusion or pneumothorax. No cardiomegaly. Tortuous aorta with aortic atherosclerosis. No acute fracture or destructive lesions. Multilevel thoracic osteophytosis. IMPRESSION: 1. Bilateral perihilar interstitial opacities,, which may represent  bronchovascular crowding due to low lung volumes, interstitial edema, or atypical/viral infection, in the correct clinical context. 2. Patchy opacities in the right lung base, possibly atelectasis, asymmetric edema, or pneumonia. Electronically Signed   By: Rogelia Myers M.D.   On: 05/07/2024 12:22    Microbiology: Recent Results (from the past 240 hours)  Blood culture (routine x 2)     Status: None   Collection Time: 05/07/24 11:43 AM   Specimen: BLOOD  Result Value Ref Range Status   Specimen Description BLOOD BLOOD LEFT HAND  Final   Special Requests   Final    BOTTLES DRAWN AEROBIC AND ANAEROBIC Blood Culture results may not be  optimal due to an inadequate volume of blood received in culture bottles   Culture   Final    NO GROWTH 5 DAYS Performed at [facility], [address] [place], [place] [address]    Report Status 05/12/2024 FINAL  Final  Blood culture (routine x 2)     Status: None   Collection Time: 05/07/24 11:43 AM   Specimen: BLOOD  Result Value Ref Range Status   Specimen Description BLOOD BLOOD RIGHT HAND  Final   Special Requests   Final    BOTTLES DRAWN AEROBIC AND ANAEROBIC Blood Culture results may not be optimal due to an inadequate volume of blood received in culture bottles   Culture   Final    NO GROWTH 5 DAYS Performed at [facility], [address] [place], [place] [address]    Report Status 05/12/2024 FINAL  Final  Resp panel by RT-PCR (RSV, Flu A&B, Covid) Anterior Nasal Swab     Status: Abnormal   Collection Time: 05/07/24 11:44 AM   Specimen: Anterior Nasal Swab  Result Value Ref Range Status   SARS Coronavirus 2 by RT PCR POSITIVE (A) NEGATIVE Final    Comment: (NOTE) SARS-CoV-2 target nucleic acids are DETECTED.  The SARS-CoV-2 RNA is generally detectable in upper respiratory specimens during the acute phase of infection. Positive results are indicative of the presence of the identified virus, but do not rule out  bacterial infection or co-infection with other pathogens not detected by the test. Clinical correlation with patient history and other diagnostic information is necessary to determine patient infection status. The expected result is Negative.  Fact Sheet for Patients: BloggerCourse.com  Fact Sheet for Healthcare Providers: SeriousBroker.it  This test is not yet approved or cleared by the [place]  FDA and  has been authorized for detection and/or diagnosis of SARS-CoV-2 by FDA under an Emergency Use Authorization (EUA).  This EUA will remain in effect (meaning this test can be used) for the duration of  the COVID-19 declaration under Section 564(b)(1) of the A ct, 21 U.S.C. section 360bbb-3(b)(1), unless the authorization is terminated or revoked sooner.     Influenza A by PCR NEGATIVE NEGATIVE Final   Influenza B by PCR NEGATIVE NEGATIVE Final    Comment: (NOTE) The Xpert Xpress SARS-CoV-2/FLU/RSV plus assay is intended as an aid in the diagnosis of influenza from Nasopharyngeal swab specimens and should not be used as a sole basis for treatment. Nasal washings and aspirates are unacceptable for Xpert Xpress SARS-CoV-2/FLU/RSV testing.  Fact Sheet for Patients: BloggerCourse.com  Fact Sheet for Healthcare Providers: SeriousBroker.it  This test is not yet approved or cleared by the [place]  FDA and has been authorized for detection and/or diagnosis of SARS-CoV-2 by FDA under an Emergency Use Authorization (EUA). This EUA will remain in effect (meaning this test can be used) for the duration of the COVID-19 declaration under Section 564(b)(1) of the Act, 21 U.S.C. section 360bbb-3(b)(1), unless the authorization is terminated or revoked.     Resp Syncytial Virus by PCR NEGATIVE NEGATIVE Final    Comment: (NOTE) Fact Sheet for  Patients: BloggerCourse.com  Fact Sheet for Healthcare Providers: SeriousBroker.it  This test is not yet approved or cleared by the [place]  FDA and has been authorized for detection and/or diagnosis of SARS-CoV-2 by FDA under an Emergency Use Authorization (EUA). This EUA will remain in effect (meaning this test can be used) for the duration of the COVID-19 declaration under Section 564(b)(1) of the Act,  21 U.S.C. section 360bbb-3(b)(1), unless the authorization is terminated or revoked.  Performed at [facility], [address] [place], [place] [address]   MRSA Next Gen by PCR, Nasal     Status: None   Collection Time: 05/08/24  4:47 PM   Specimen: Nasal Swab  Result Value Ref Range Status   MRSA by PCR Next Gen NOT DETECTED NOT DETECTED Final    Comment: (NOTE) The GeneXpert MRSA Assay (FDA approved for NASAL specimens only), is one component of a comprehensive MRSA colonization surveillance program. It is not intended to diagnose MRSA infection nor to guide or monitor treatment for MRSA infections. Test performance is not FDA approved in patients less than 45 years old. Performed at [facility], [address] [place], [place] [address]      Labs: CBC: Recent Labs  Lab 05/11/24 0518  WBC 5.2  HGB 14.6  HCT 45.9  MCV 89.6  PLT 171   Basic Metabolic Panel: Recent Labs  Lab 05/10/24 0321 05/11/24 0518 05/12/24 0549 05/13/24 0429 05/14/24 0532 05/15/24 0428  NA 140 140 137 135 136 136  K 3.5 4.2 3.7 3.4* 3.6 3.6  CL 105 104 104 104 104 102  CO2 25 22 23 24 24 26   GLUCOSE 264* 156* 141* 140* 157* 192*  BUN 24* 21 19 16 12 13   CREATININE 1.92* 1.43* 1.39* 1.43* 1.10 1.18  CALCIUM  8.1* 8.5* 8.3* 8.3* 8.5* 8.6*  MG 2.5* 2.5* 2.1 2.0 1.8  --    Liver Function Tests: No results for input(s): AST, ALT, ALKPHOS, BILITOT, PROT, ALBUMIN in the last 168 hours. No results for  input(s): LIPASE, AMYLASE in the last 168 hours. No results for input(s): AMMONIA in the last 168 hours. Cardiac Enzymes: No results for input(s): CKTOTAL, CKMB, CKMBINDEX, TROPONINI in the last 168 hours. BNP (last 3 results) Recent Labs    05/08/24 0540  BNP 34.9   CBG: Recent Labs  Lab 05/15/24 2014 05/16/24 0002 05/16/24 0313 05/16/24 0900 05/16/24 1247  GLUCAP 285* 154* 145* 169* 276*    Time spent: 35 minutes  Signed:  Elvan Sor  [facility] 05/16/2024 1:28 PM

## 2024-05-16 NOTE — Progress Notes (Signed)
 Called report to RN Denetrice at [facility] and rehab room [phone]

## 2024-05-17 ENCOUNTER — Encounter: Payer: Self-pay | Admitting: Family Medicine

## 2024-06-06 ENCOUNTER — Telehealth: Payer: Self-pay

## 2024-06-06 NOTE — Transitions of Care (Post Inpatient/ED Visit) (Signed)
 06/06/2024  Name: Kirk Bennett Carolee Mickey. MRN: [id number] DOB: 1951-04-14  Today's TOC FU Call Status: Today's TOC FU Call Status:: Successful TOC FU Call Completed TOC FU Call Complete Date: 06/06/24 Patient's Name and Date of Birth confirmed.  Transition Care Management Follow-up Telephone Call Date of Discharge: 06/05/24 Discharge Facility: Other [facility]) Name of Other (Non-[facility]) Discharge Facility: [facility] Type of Discharge: Inpatient Admission Primary Inpatient Discharge Diagnosis:: sepsis How have you been since you were released from the hospital?: Better Any questions or concerns?: No  Items Reviewed: Did you receive and understand the discharge instructions provided?: Yes Medications obtained,verified, and reconciled?: Yes (Medications Reviewed) Any new allergies since your discharge?: No Dietary orders reviewed?: Yes Do you have support at home?: Yes People in Home [RPT]: spouse  Medications Reviewed Today: Medications Reviewed Today     Reviewed by Emmitt Pan, LPN (Licensed Practical Nurse) on 06/06/24 at [phone]  Med List Status: <None>   Medication Order Taking? Sig Documenting Provider Last Dose Status Informant  atorvastatin  (LIPITOR) 20 MG tablet [id number] Yes TAKE 1 TABLET BY MOUTH EVERY DAY Rilla Baller, MD  Active Child, Pharmacy Records  Blood Glucose Monitoring Suppl DEVI [id number] Yes 1 each by Does not apply route in the morning, at noon, and at bedtime. May substitute to any manufacturer covered by patient's insurance. Rilla Baller, MD  Active Child, Pharmacy Records  carbidopa -levodopa  (SINEMET  IR) 25-100 MG tablet [id number] Yes Take 3 tablets by mouth 3 (three) times daily. [provider]  Active Child, Pharmacy Records  Cholecalciferol (VITAMIN D3) 1000 units CAPS [id number] Yes Take 1,000 Units by mouth daily. [provider]  Active Child, Pharmacy Records  Continuous Glucose Sensor (FREESTYLE LIBRE 3  SENSOR) [place] [id number] Yes PLACE 1 SENSOR ON THE SKIN EVERY 14 DAYS. USE TO CHECK GLUCOSE CONTINUOUSLY Rilla Baller, MD  Active Child, Pharmacy Records  cyanocobalamin  (VITAMIN B12) 1000 MCG tablet [id number] Yes Take 1 tablet (1,000 mcg total) by mouth once a week. Rilla Baller, MD  Active Child, Pharmacy Records  Dulaglutide  (TRULICITY ) 0.75 MG/0.5ML EMMANUEL [id number] Yes INJECT 0.75 MG SUBCUTANEOUSLY ONE TIME PER WEEK Rilla Baller, MD  Active Child, Pharmacy Records  empagliflozin (JARDIANCE) 25 MG TABS tablet [id number] Yes Take 1 tablet (25 mg total) by mouth daily. Rilla Baller, MD  Active Child, Pharmacy Records  glimepiride  (AMARYL ) 4 MG tablet [id number] Yes Take 1 tablet (4 mg total) by mouth daily with breakfast. Rilla Baller, MD  Active Child, Pharmacy Records  glucose blood (ONE TOUCH ULTRA TEST) test strip [id number] Yes Use to check sugar fasting in the AM and 2 hours after lunch or supper. Dx: E11.21 Rilla Baller, MD  Active Child, Pharmacy Records  latanoprost  (XALATAN ) 0.005 % ophthalmic solution [id number] Yes Place 1 drop into both eyes at bedtime. Rilla Baller, MD  Active Child, Pharmacy Records  levETIRAcetam  (KEPPRA ) 1000 MG tablet [id number] Yes Take 1,000 mg by mouth 2 (two) times daily. [provider]  Active Child, Pharmacy Records  lisinopril  (ZESTRIL ) 10 MG tablet [id number] Yes Take 1 tablet (10 mg total) by mouth 2 (two) times daily. Rilla Baller, MD  Active Child, Pharmacy Records  metFORMIN  (GLUCOPHAGE ) 500 MG tablet [id number] Yes Take 1 tablet (500 mg total) by mouth daily with breakfast AND 2 tablets (1,000 mg total) daily with supper. Rilla Baller, MD  Active Child, Pharmacy Records  Multiple Vitamin (MULTIVITAMIN ADULT) TABS [id number] Yes Take 1 tablet by mouth daily. Rilla Baller, MD  Active Child, Pharmacy Records  tamsulosin  (  FLOMAX ) 0.4 MG CAPS capsule [id number] Yes Take 2 capsules (0.8 mg total) by mouth daily  after supper. Rilla Baller, MD  Active Child, Pharmacy Records            Home Care and Equipment/Supplies: Were Home Health Services Ordered?: Yes Name of Home Health Agency:: [facility] Has Agency set up a time to come to your home?: Yes First Home Health Visit Date: 06/06/24 Any new equipment or medical supplies ordered?: NA  Functional Questionnaire: Do you need assistance with bathing/showering or dressing?: Yes Do you need assistance with meal preparation?: Yes Do you need assistance with eating?: No Do you have difficulty maintaining continence: Yes Do you need assistance with getting out of bed/getting out of a chair/moving?: No Do you have difficulty managing or taking your medications?: Yes  Follow up appointments reviewed: PCP Follow-up appointment confirmed?: Yes Date of PCP follow-up appointment?: 06/12/24 Follow-up Provider: [facility] Follow-up appointment confirmed?: Yes Date of Specialist follow-up appointment?: 06/17/24 Follow-Up Specialty Provider:: [facility] Do you need transportation to your follow-up appointment?: No Do you understand care options if your condition(s) worsen?: Yes-patient verbalized understanding    SIGNATURE Julian Lemmings, LPN [facility] Nurse Health Advisor Direct Dial [phone]

## 2024-06-11 ENCOUNTER — Ambulatory Visit: Admitting: Family

## 2024-06-12 ENCOUNTER — Ambulatory Visit: Admitting: Family Medicine

## 2024-06-12 ENCOUNTER — Encounter: Payer: Self-pay | Admitting: Family Medicine

## 2024-06-12 VITALS — BP 118/60 | HR 83 | Temp 98.6°F | Ht 72.0 in | Wt 243.0 lb

## 2024-06-12 DIAGNOSIS — U071 COVID-19: Secondary | ICD-10-CM

## 2024-06-12 DIAGNOSIS — I10 Essential (primary) hypertension: Secondary | ICD-10-CM

## 2024-06-12 DIAGNOSIS — N3941 Urge incontinence: Secondary | ICD-10-CM

## 2024-06-12 DIAGNOSIS — R350 Frequency of micturition: Secondary | ICD-10-CM

## 2024-06-12 DIAGNOSIS — R051 Acute cough: Secondary | ICD-10-CM

## 2024-06-12 DIAGNOSIS — G20C Parkinsonism, unspecified: Secondary | ICD-10-CM | POA: Diagnosis not present

## 2024-06-12 DIAGNOSIS — E1121 Type 2 diabetes mellitus with diabetic nephropathy: Secondary | ICD-10-CM

## 2024-06-12 DIAGNOSIS — R600 Localized edema: Secondary | ICD-10-CM

## 2024-06-12 DIAGNOSIS — G40109 Localization-related (focal) (partial) symptomatic epilepsy and epileptic syndromes with simple partial seizures, not intractable, without status epilepticus: Secondary | ICD-10-CM

## 2024-06-12 DIAGNOSIS — N401 Enlarged prostate with lower urinary tract symptoms: Secondary | ICD-10-CM

## 2024-06-12 DIAGNOSIS — G231 Progressive supranuclear ophthalmoplegia [Steele-Richardson-Olszewski]: Secondary | ICD-10-CM

## 2024-06-12 DIAGNOSIS — J1282 Pneumonia due to coronavirus disease 2019: Secondary | ICD-10-CM

## 2024-06-12 DIAGNOSIS — R2689 Other abnormalities of gait and mobility: Secondary | ICD-10-CM

## 2024-06-12 DIAGNOSIS — E1140 Type 2 diabetes mellitus with diabetic neuropathy, unspecified: Secondary | ICD-10-CM

## 2024-06-12 LAB — POCT GLYCOSYLATED HEMOGLOBIN (HGB A1C): Hemoglobin A1C: 6.3 % — AB (ref 4.0–5.6)

## 2024-06-12 MED ORDER — GLIMEPIRIDE 2 MG PO TABS: 2.0000 mg | ORAL_TABLET | Freq: Every day | ORAL | 1 refills | Status: AC

## 2024-06-12 NOTE — Assessment & Plan Note (Addendum)
 H/o BPH with LUTS Appreciate urology care - now [facility].  Continue flomax .

## 2024-06-12 NOTE — Assessment & Plan Note (Signed)
 Sees epileptologist on keppra  1000mg  bid

## 2024-06-12 NOTE — Progress Notes (Signed)
 Ph: [phone] Fax: [phone]   Patient ID: Kirk Bennett Carolee Mickey., male    DOB: 1951-02-16, 73 y.o.   MRN: [id number]  This visit was conducted in person.  BP 118/60   Pulse 83   Temp 98.6 F (37 C) (Oral)   Ht 6' (1.829 m)   Wt 243 lb (110.2 kg)   SpO2 96%   BMI 32.96 kg/m   BP Readings from Last 3 Encounters:  06/12/24 118/60  05/16/24 134/71  11/27/23 (!) 148/66    CC: rehab f/u visit  Subjective:   HPI: Kirk Bennett. is a 73 y.o. 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 Most recently home health was reordered 02/2024.   Established with [facility] urology Mrs Ronnette Kiang NP for BPH with LUTS. Was referred to [facility] Dr Shane last seen 02/2024 for UDS. Managing urinary incontinence with condom cath.   Recent hospitalization 05/07/2024 at [facility] presented with AMS, found to have COVID complicated by bacterial pneumonia, and UTI complicated by sepsis.  Hospital records reviewed. Med rec performed.  No UCx checked, UA was abnormal with small kgb but no LE/nitrite, rare bact, 0-5 WBC Initially treated with IV vancomycin  switched to IV zosyn  and azithromycin  ,discharged on augmentin  course to complete 7 days.  SLP eval - recommended regular thin liquids.  AKI with Cr up to 1.9 - treated with gentle IV fluid, discharge Cr down to 1.18.   Discharged to [facility] - and he came home on 06/05/2024.   Cough restarted 2d ago, dry cough. No fevers/chills, ear or tooth pain, ST, abd pain, nausea, diarrhea.   DM - back on jardiance 25mg  daily, trulicity  0.75mg  weekly, metformin  500/1000mg  and amaryl  4mg  daily. Some low sugars noted overnight (ie 11pm, 5am).  CGM data: FL3+ 7 day average sugar: 137,  time in range 87%, hypoglycemia 1%, stage 1 hyperglycemia 12%, stage 2 hyperglycemia 0%.   Lab Results  Component Value Date   HGBA1C 6.3 (A) 06/12/2024     Home health - set up through [facility] PT - also receiving aide assistance through [facility] through [place].  Other follow up appointments scheduled: saw epileptologist Dr Jerene at [facility] on 10/9, stable eval.  ______________________________________________________________________ Hospital admission: 05/07/2024 Hospital discharge: 05/16/2024 Rehab discharge: 06/05/2024 TCM f/u phone call:  performed on 06/06/2024   Recommendations for Outpatient Follow-up:  F/u with an MD, need to be seen by an MD in 1-2 days at SNF Follow up LABS/TEST:  Monitor CBG and titrate meds  Discharge Diagnoses:  Principal Problem:   Pneumonia due to COVID-19 virus Active Problems:   Type 2 diabetes mellitus with diabetic nephropathy (HCC)   Essential hypertension, benign   OSA (obstructive sleep apnea)   BPH (benign prostatic hyperplasia)   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HCC)   Pneumonia   Admitted From: Home Disposition:  SNF       Relevant past medical, surgical, family and social history reviewed and updated as indicated. Interim medical history since our last visit reviewed. Allergies and medications reviewed and updated. Outpatient Medications Prior to Visit  Medication Sig Dispense Refill   atorvastatin  (LIPITOR) 20 MG tablet TAKE 1 TABLET BY MOUTH EVERY DAY 90 tablet 4   Blood Glucose Monitoring Suppl DEVI 1 each  by Does not apply route in the morning, at noon, and at bedtime. May substitute to any manufacturer covered by patient's insurance. 1 each 0   carbidopa -levodopa  (SINEMET  IR) 25-100 MG tablet Take 3 tablets by mouth 3 (three) times daily.     Cholecalciferol (VITAMIN D3) 1000 units CAPS Take 1,000 Units by mouth daily.     Continuous Glucose Sensor (FREESTYLE LIBRE 3 SENSOR) MISC PLACE 1 SENSOR ON THE SKIN EVERY 14 DAYS. USE TO CHECK  GLUCOSE CONTINUOUSLY 2 each 6   cyanocobalamin  (VITAMIN B12) 1000 MCG tablet Take 1 tablet (1,000 mcg total) by mouth once a week.     Dulaglutide  (TRULICITY ) 0.75 MG/0.5ML SOAJ INJECT 0.75 MG SUBCUTANEOUSLY ONE TIME PER WEEK 2 mL 11   empagliflozin (JARDIANCE) 25 MG TABS tablet Take 1 tablet (25 mg total) by mouth daily. 90 tablet 3   glucose blood (ONE TOUCH ULTRA TEST) test strip Use to check sugar fasting in the AM and 2 hours after lunch or supper. Dx: E11.21 100 each 3   latanoprost  (XALATAN ) 0.005 % ophthalmic solution Place 1 drop into both eyes at bedtime.     levETIRAcetam  (KEPPRA ) 1000 MG tablet Take 1,000 mg by mouth 2 (two) times daily.     lisinopril  (ZESTRIL ) 10 MG tablet Take 1 tablet (10 mg total) by mouth 2 (two) times daily. 180 tablet 4   metFORMIN  (GLUCOPHAGE ) 500 MG tablet Take 1 tablet (500 mg total) by mouth daily with breakfast AND 2 tablets (1,000 mg total) daily with supper. 270 tablet 1   Multiple Vitamin (MULTIVITAMIN ADULT) TABS Take 1 tablet by mouth daily.     glimepiride  (AMARYL ) 4 MG tablet Take 1 tablet (4 mg total) by mouth daily with breakfast. 90 tablet 4   metoprolol  succinate (TOPROL -XL) 50 MG 24 hr tablet Take 0.5 tablets (25 mg total) by mouth daily. Take with or immediately following a meal.     metoprolol  tartrate (LOPRESSOR ) 25 MG tablet Take 25 mg by mouth daily.     tamsulosin  (FLOMAX ) 0.4 MG CAPS capsule Take 2 capsules (0.8 mg total) by mouth daily after supper.     metoprolol  succinate (TOPROL -XL) 50 MG 24 hr tablet Take 0.5 tablets (25 mg total) by mouth daily. Take with or immediately following a meal. [facility]     tamsulosin  (FLOMAX ) 0.4 MG CAPS capsule Take 1 capsule (0.4 mg total) by mouth daily after supper. Through the [facility]     No facility-administered medications prior to visit.     Per HPI unless specifically indicated in ROS section below Review of Systems  Objective:  BP 118/60   Pulse 83   Temp 98.6 F (37 C) (Oral)   Ht 6'  (1.829 m)   Wt 243 lb (110.2 kg)   SpO2 96%   BMI 32.96 kg/m   Wt Readings from Last 3 Encounters:  06/12/24 243 lb (110.2 kg)  05/07/24 243 lb 2.7 oz (110.3 kg)  11/27/23 248 lb 2 oz (112.5 kg)      Physical Exam Vitals and nursing note reviewed.  Constitutional:      Appearance: Normal appearance. He is not ill-appearing.     Comments:  Sitting in wheelchair Somewhat stiff movements  HENT:     Mouth/Throat:     Mouth: Mucous membranes are moist.     Pharynx: Oropharynx is clear. No posterior oropharyngeal erythema.  Eyes:     Extraocular Movements: Extraocular movements intact.     Pupils: Pupils are equal,  round, and reactive to light.  Cardiovascular:     Rate and Rhythm: Normal rate and regular rhythm.     Pulses: Normal pulses.     Heart sounds: Normal heart sounds. No murmur heard. Pulmonary:     Effort: Pulmonary effort is normal. No respiratory distress.     Breath sounds: Normal breath sounds. No wheezing, rhonchi or rales.  Musculoskeletal:        General: No tenderness.     Cervical back: Normal range of motion and neck supple.     Right lower leg: Edema (1+) present.     Left lower leg: Edema (1+) present.     Comments:  2+ DP   Skin:    General: Skin is warm and dry.     Findings: No rash.  Neurological:     Mental Status: He is alert.  Psychiatric:        Mood and Affect: Mood normal.        Behavior: Behavior normal.       Lab Results  Component Value Date   NA 136 05/15/2024   CL 102 05/15/2024   K 3.6 05/15/2024   CO2 26 05/15/2024   BUN 13 05/15/2024   CREATININE 1.18 05/15/2024   GFRNONAA >60 05/15/2024   CALCIUM  8.6 (L) 05/15/2024   PHOS 3.9 11/17/2023   ALBUMIN 3.4 (L) 05/08/2024   GLUCOSE 192 (H) 05/15/2024    Lab Results  Component Value Date   ALT <5 05/08/2024   AST 12 (L) 05/08/2024   ALKPHOS 68 05/08/2024   BILITOT 1.2 05/08/2024    Lab Results  Component Value Date   WBC 5.2 05/11/2024   HGB 14.6 05/11/2024   HCT  45.9 05/11/2024   MCV 89.6 05/11/2024   PLT 171 05/11/2024    Lab Results  Component Value Date   HGBA1C 6.3 (A) 06/12/2024    Lab Results  Component Value Date   CHOL 129 11/17/2023   HDL 40.90 11/17/2023   LDLCALC 72 11/17/2023   LDLDIRECT 65 03/04/2016   TRIG 83.0 11/17/2023   CHOLHDL 3 11/17/2023   Lab Results  Component Value Date   TSH 1.95 11/30/2023     Assessment & Plan:   Problem List Items Addressed This Visit     Type 2 diabetes mellitus with diabetic nephropathy (HCC)   Chronic, great control as evidenced by improved A1c and well controlled home readings.  Reviewed 7d CGM data.  Wife endorses some hypoglycemia overnight - will drop amaryl  to 2mg  daily with breakfast. Continue jardiance, metformin , trulicity .       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 [facility]      Relevant Medications   metoprolol  succinate (TOPROL -XL) 50 MG 24 hr tablet   Cough   Recurrence of dry cough that started a few days ago.  Lungs clear on exam, no signs of ongoing infection.  Will monitor for now. Consider ACEI-induced cough if ongoing       Urge urinary incontinence   H/o BPH with LUTS Appreciate urology care - now seeing [facility].  Continue flomax .  Relevant Medications   tamsulosin  (FLOMAX ) 0.4 MG CAPS capsule   Impaired gait and mobility   Continue with HHPT through [facility] as well as aide through [facility] through [place]      Type 2 diabetes mellitus with diabetic neuropathy, unspecified (HCC)   Relevant Medications   glimepiride  (AMARYL ) 2 MG tablet   BPH (benign  prostatic hyperplasia)   Relevant Medications   tamsulosin  (FLOMAX ) 0.4 MG CAPS capsule   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HCC)   PSP (progressive supranuclear palsy) (HCC)   H/o atypical parkinsonism, possible PSP managed with sinemet  and regularly sees movement clinic at [facility] - appreciate Dr [facility] care.       Pedal edema   Ongoing - consider diuretic, deferred for now.  Encouraged limiting salt/sodium and leg elevation. Already wears compression stockings      Pneumonia due to COVID-19 virus - Primary   Recent hospitalization for COVID pneumonia  Competed antibiotic course with improvement in symptoms. Did have rehab stay at [facility] x20d.  Lungs clear on exam today.  There was a question of UTI diagnosis during latest hospitalization - I don't see any evidence of this.         Meds ordered this encounter  Medications   glimepiride  (AMARYL ) 2 MG tablet    Sig: Take 1 tablet (2 mg total) by mouth daily with breakfast.    Dispense:  90 tablet    Refill:  1    Note new dose    Orders Placed This Encounter  Procedures   POCT glycosylated hemoglobin (Hb A1C)    Patient Instructions  Drop glimepiride  (amaryl ) to 2mg  with breakfast - new dose at pharmacy Continue other medicines. Monitor leg swelling, let me know if worsening or if unexpected weight gain noted to consider adding water pill to regimen.  Good to see you today Return as needed or in 6 months for physical.   Follow up plan: Return in about 6 months (around 12/11/2024), or if symptoms worsen or fail to improve, for annual exam, prior fasting for blood work.  Anton Blas, MD

## 2024-06-12 NOTE — Assessment & Plan Note (Signed)
 H/o atypical parkinsonism, possible PSP managed with sinemet  and regularly sees movement clinic at [facility] - appreciate Dr [facility] care.

## 2024-06-12 NOTE — Assessment & Plan Note (Signed)
 Recent hospitalization for COVID pneumonia  Competed antibiotic course with improvement in symptoms. Did have rehab stay at [facility] x20d.  Lungs clear on exam today.  There was a question of UTI diagnosis during latest hospitalization - I don't see any evidence of this.

## 2024-06-12 NOTE — Assessment & Plan Note (Signed)
 Chronic, great control as evidenced by improved A1c and well controlled home readings.  Reviewed 7d CGM data.  Wife endorses some hypoglycemia overnight - will drop amaryl  to 2mg  daily with breakfast. Continue jardiance, metformin , trulicity .

## 2024-06-12 NOTE — Assessment & Plan Note (Signed)
 Ongoing - consider diuretic, deferred for now.  Encouraged limiting salt/sodium and leg elevation. Already wears compression stockings

## 2024-06-12 NOTE — Assessment & Plan Note (Addendum)
 Chronic, BP running on low side of normal - continue current regimen including newly added Toprol  XL 25mg  daily. [facility]

## 2024-06-12 NOTE — Assessment & Plan Note (Signed)
 Continue with HHPT through [facility] as well as aide through [facility] through [place]

## 2024-06-12 NOTE — Patient Instructions (Addendum)
 Drop glimepiride  (amaryl ) to 2mg  with breakfast - new dose at pharmacy Continue other medicines. Monitor leg swelling, let me know if worsening or if unexpected weight gain noted to consider adding water pill to regimen.  Good to see you today Return as needed or in 6 months for physical.

## 2024-06-12 NOTE — Assessment & Plan Note (Signed)
 Recurrence of dry cough that started a few days ago.  Lungs clear on exam, no signs of ongoing infection.  Will monitor for now. Consider ACEI-induced cough if ongoing

## 2024-06-13 NOTE — Telephone Encounter (Signed)
 Seen in office yesterday

## 2024-07-02 ENCOUNTER — Ambulatory Visit: Admitting: Podiatry

## 2024-07-02 DIAGNOSIS — M21962 Unspecified acquired deformity of left lower leg: Secondary | ICD-10-CM

## 2024-07-02 DIAGNOSIS — M21961 Unspecified acquired deformity of right lower leg: Secondary | ICD-10-CM

## 2024-07-02 DIAGNOSIS — M79674 Pain in right toe(s): Secondary | ICD-10-CM

## 2024-07-02 DIAGNOSIS — E1121 Type 2 diabetes mellitus with diabetic nephropathy: Secondary | ICD-10-CM

## 2024-07-02 DIAGNOSIS — B351 Tinea unguium: Secondary | ICD-10-CM | POA: Diagnosis not present

## 2024-07-02 DIAGNOSIS — M79675 Pain in left toe(s): Secondary | ICD-10-CM | POA: Diagnosis not present

## 2024-07-02 NOTE — Progress Notes (Signed)
 This patient returns to my office for at risk foot care.  This patient requires this care by a professional since this patient will be at risk due to having  CKD and type 2 diabetes.  This patient is unable to cut nails himself since the patient cannot reach his nails.These nails are painful walking and wearing shoes.  This patient presents for at risk foot care today.  Patient would also like to obtain orthotics as he is flat-footed and is a diabetic.  General Appearance  Alert, conversant and in no acute stress.  Vascular  Dorsalis pedis  are palpable  bilaterally. Posterior tibial pulses are absent. Capillary return is within normal limits  bilaterally. Temperature is within normal limits  bilaterally.  Neurologic  Senn-Weinstein monofilament wire test within normal limits  bilaterally. Muscle power within normal limits bilaterally.  Nails Thick disfigured discolored nails with subungual debris  from hallux to fifth toes bilaterally. No evidence of bacterial infection or drainage bilaterally.  Orthopedic  No limitations of motion  feet .  No crepitus or effusions noted.  Painful right swollen ankle. No pain or increased temperature or ROM right ankle.  Bilateral hammertoe deformity noted.  Semiflexible in nature  Skin  normotropic skin with no porokeratosis noted bilaterally.  No signs of infections or ulcers noted.     Onychomycosis  Pain in right toes  Pain in left toes with bilateral hammertoe contractures  Consent was obtained for treatment procedures.   Mechanical debridement of nails 1-5  bilaterally performed with a nail nipper.  Filed with dremel without incident.  Bilateral hammertoe noted in setting of diabetes patient will benefit from diabetic shoes diabetic shoes were dispensed   Return office visit   3 months.                   Told patient to return for periodic foot care and evaluation due to potential at risk complications. Told him to be evaluated by one of the doctors in the  office.  Pes planovalgus/foot deformity -I explained to patient the etiology of pes planovalgus and relationship with heel pain/arch pain and various treatment options were discussed.  Given patient foot structure in the setting of heel pain/arch pain I believe patient will benefit from custom-made orthotics to help control the hindfoot motion support the arch of the foot and take the stress away from arches.  Patient agrees with the plan like to proceed with orthotics -Patient was casted for orthotics   Franky Blanch D.P.M.

## 2024-08-06 ENCOUNTER — Telehealth: Payer: Self-pay

## 2024-08-06 NOTE — Telephone Encounter (Signed)
 Patient's orthotics are in and being sent to [facility].

## 2024-08-30 ENCOUNTER — Telehealth: Payer: Self-pay | Admitting: Podiatry

## 2024-08-30 NOTE — Telephone Encounter (Signed)
 Called to let pt know his orthotics were in per wife he did not want them they do not want to pay the 542.00. Per [place] pt has no choice he signed form for orthotics. I called pt left message for him to call us  to schedule an appt.

## 2024-09-19 ENCOUNTER — Telehealth: Payer: Self-pay | Admitting: Podiatry

## 2024-09-19 NOTE — Telephone Encounter (Signed)
 Orthotics are in [facility] called to schedule per wife they do not want them no one told them a price about them. Wife states a production designer, theatre/television/film can call her but she does not want them.

## 2024-12-04 ENCOUNTER — Other Ambulatory Visit

## 2024-12-11 ENCOUNTER — Encounter: Admitting: Family Medicine

## 2025-07-01 ENCOUNTER — Ambulatory Visit: Admitting: Podiatry
